# Patient Record
Sex: Female | Born: 1955 | Race: Black or African American | Hispanic: No | Marital: Married | Smoking: Never smoker
Health system: Southern US, Community
[De-identification: ages and names within clinical notes are randomized; demographics above are authoritative.]

## PROBLEM LIST (undated history)

## (undated) DIAGNOSIS — G43909 Migraine, unspecified, not intractable, without status migrainosus: Secondary | ICD-10-CM

## (undated) DIAGNOSIS — K5792 Diverticulitis of intestine, part unspecified, without perforation or abscess without bleeding: Secondary | ICD-10-CM

## (undated) DIAGNOSIS — M7989 Other specified soft tissue disorders: Secondary | ICD-10-CM

## (undated) DIAGNOSIS — R946 Abnormal results of thyroid function studies: Secondary | ICD-10-CM

## (undated) DIAGNOSIS — I10 Essential (primary) hypertension: Secondary | ICD-10-CM

## (undated) DIAGNOSIS — G4700 Insomnia, unspecified: Secondary | ICD-10-CM

## (undated) DIAGNOSIS — E559 Vitamin D deficiency, unspecified: Secondary | ICD-10-CM

## (undated) DIAGNOSIS — T451X5A Adverse effect of antineoplastic and immunosuppressive drugs, initial encounter: Secondary | ICD-10-CM

## (undated) DIAGNOSIS — D649 Anemia, unspecified: Secondary | ICD-10-CM

## (undated) DIAGNOSIS — D61818 Other pancytopenia: Secondary | ICD-10-CM

## (undated) DIAGNOSIS — I82409 Acute embolism and thrombosis of unspecified deep veins of unspecified lower extremity: Secondary | ICD-10-CM

## (undated) DIAGNOSIS — R569 Unspecified convulsions: Secondary | ICD-10-CM

## (undated) DIAGNOSIS — G629 Polyneuropathy, unspecified: Secondary | ICD-10-CM

## (undated) DIAGNOSIS — E079 Disorder of thyroid, unspecified: Secondary | ICD-10-CM

## (undated) DIAGNOSIS — M898X9 Other specified disorders of bone, unspecified site: Secondary | ICD-10-CM

## (undated) DIAGNOSIS — C9000 Multiple myeloma not having achieved remission: Secondary | ICD-10-CM

## (undated) DIAGNOSIS — Z9221 Personal history of antineoplastic chemotherapy: Secondary | ICD-10-CM

## (undated) DIAGNOSIS — E119 Type 2 diabetes mellitus without complications: Secondary | ICD-10-CM

## (undated) DIAGNOSIS — D701 Agranulocytosis secondary to cancer chemotherapy: Secondary | ICD-10-CM

## (undated) DIAGNOSIS — D472 Monoclonal gammopathy: Secondary | ICD-10-CM

## (undated) DIAGNOSIS — J40 Bronchitis, not specified as acute or chronic: Secondary | ICD-10-CM

## (undated) DIAGNOSIS — R413 Other amnesia: Secondary | ICD-10-CM

## (undated) HISTORY — DX: Agranulocytosis secondary to cancer chemotherapy: D70.1

## (undated) HISTORY — DX: Essential (primary) hypertension: I10

## (undated) HISTORY — DX: Anemia, unspecified: D64.9

## (undated) HISTORY — DX: Monoclonal gammopathy: D47.2

## (undated) HISTORY — DX: Bronchitis, not specified as acute or chronic: J40

## (undated) HISTORY — DX: Multiple myeloma not having achieved remission: C90.00

## (undated) HISTORY — DX: Polyneuropathy, unspecified: G62.9

## (undated) HISTORY — DX: Other pancytopenia: D61.818

## (undated) HISTORY — DX: Other amnesia: R41.3

## (undated) HISTORY — DX: Vitamin D deficiency, unspecified: E55.9

## (undated) HISTORY — PX: HEMORRHOID SURGERY: SHX153

## (undated) HISTORY — DX: Other specified soft tissue disorders: M79.89

## (undated) HISTORY — DX: Acute embolism and thrombosis of unspecified deep veins of unspecified lower extremity: I82.409

## (undated) HISTORY — DX: Abnormal results of thyroid function studies: R94.6

## (undated) HISTORY — DX: Adverse effect of antineoplastic and immunosuppressive drugs, initial encounter: T45.1X5A

## (undated) HISTORY — DX: Disorder of thyroid, unspecified: E07.9

## (undated) HISTORY — DX: Insomnia, unspecified: G47.00

## (undated) HISTORY — DX: Diverticulitis of intestine, part unspecified, without perforation or abscess without bleeding: K57.92

## (undated) HISTORY — DX: Other specified disorders of bone, unspecified site: M89.8X9

## (undated) HISTORY — DX: Migraine, unspecified, not intractable, without status migrainosus: G43.909

## (undated) HISTORY — DX: Unspecified convulsions: R56.9

---
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
## 1999-10-09 ENCOUNTER — Encounter: Admission: RE | Admit: 1999-10-09 | Discharge: 1999-10-09 | Payer: Self-pay | Admitting: Gynecology

## 1999-10-09 ENCOUNTER — Encounter: Payer: Self-pay | Admitting: Gynecology

## 2001-02-23 ENCOUNTER — Other Ambulatory Visit: Admission: RE | Admit: 2001-02-23 | Discharge: 2001-02-23 | Payer: Self-pay | Admitting: Gastroenterology

## 2001-05-11 ENCOUNTER — Other Ambulatory Visit: Admission: RE | Admit: 2001-05-11 | Discharge: 2001-05-11 | Payer: Self-pay | Admitting: Gynecology

## 2001-07-20 ENCOUNTER — Other Ambulatory Visit: Admission: RE | Admit: 2001-07-20 | Discharge: 2001-07-20 | Payer: Self-pay | Admitting: Gynecology

## 2001-09-13 ENCOUNTER — Encounter: Admission: RE | Admit: 2001-09-13 | Discharge: 2001-09-13 | Payer: Self-pay | Admitting: Gynecology

## 2001-09-13 ENCOUNTER — Encounter: Payer: Self-pay | Admitting: Gynecology

## 2001-12-27 ENCOUNTER — Other Ambulatory Visit: Admission: RE | Admit: 2001-12-27 | Discharge: 2001-12-27 | Payer: Self-pay | Admitting: Gynecology

## 2002-08-12 ENCOUNTER — Other Ambulatory Visit: Admission: RE | Admit: 2002-08-12 | Discharge: 2002-08-12 | Payer: Self-pay | Admitting: Gynecology

## 2002-11-16 ENCOUNTER — Encounter: Payer: Self-pay | Admitting: Gynecology

## 2002-11-16 ENCOUNTER — Encounter: Admission: RE | Admit: 2002-11-16 | Discharge: 2002-11-16 | Payer: Self-pay | Admitting: Gynecology

## 2003-01-26 ENCOUNTER — Other Ambulatory Visit: Admission: RE | Admit: 2003-01-26 | Discharge: 2003-01-26 | Payer: Self-pay | Admitting: Gynecology

## 2004-02-08 ENCOUNTER — Encounter: Admission: RE | Admit: 2004-02-08 | Discharge: 2004-02-08 | Payer: Self-pay | Admitting: Gynecology

## 2004-07-25 ENCOUNTER — Other Ambulatory Visit: Admission: RE | Admit: 2004-07-25 | Discharge: 2004-07-25 | Payer: Self-pay | Admitting: Gynecology

## 2004-07-26 ENCOUNTER — Encounter: Admission: RE | Admit: 2004-07-26 | Discharge: 2004-07-26 | Payer: Self-pay | Admitting: Gynecology

## 2005-05-23 ENCOUNTER — Encounter: Admission: RE | Admit: 2005-05-23 | Discharge: 2005-05-23 | Payer: Self-pay | Admitting: Internal Medicine

## 2005-08-14 ENCOUNTER — Encounter: Admission: RE | Admit: 2005-08-14 | Discharge: 2005-08-14 | Payer: Self-pay | Admitting: *Deleted

## 2006-02-06 ENCOUNTER — Other Ambulatory Visit: Admission: RE | Admit: 2006-02-06 | Discharge: 2006-02-06 | Payer: Self-pay | Admitting: Gynecology

## 2006-07-02 ENCOUNTER — Encounter: Admission: RE | Admit: 2006-07-02 | Discharge: 2006-07-02 | Payer: Self-pay | Admitting: Gynecology

## 2007-07-06 ENCOUNTER — Encounter: Admission: RE | Admit: 2007-07-06 | Discharge: 2007-07-06 | Payer: Self-pay | Admitting: Gynecology

## 2007-07-21 ENCOUNTER — Other Ambulatory Visit: Admission: RE | Admit: 2007-07-21 | Discharge: 2007-07-21 | Payer: Self-pay | Admitting: Gynecology

## 2007-07-21 ENCOUNTER — Encounter: Admission: RE | Admit: 2007-07-21 | Discharge: 2007-07-21 | Payer: Self-pay | Admitting: Internal Medicine

## 2008-07-06 ENCOUNTER — Encounter: Admission: RE | Admit: 2008-07-06 | Discharge: 2008-07-06 | Payer: Self-pay | Admitting: Internal Medicine

## 2008-07-24 ENCOUNTER — Encounter: Admission: RE | Admit: 2008-07-24 | Discharge: 2008-07-24 | Payer: Self-pay | Admitting: Internal Medicine

## 2009-01-29 ENCOUNTER — Encounter: Admission: RE | Admit: 2009-01-29 | Discharge: 2009-01-29 | Payer: Self-pay | Admitting: Internal Medicine

## 2009-07-01 ENCOUNTER — Emergency Department (HOSPITAL_COMMUNITY): Admission: EM | Admit: 2009-07-01 | Discharge: 2009-07-01 | Payer: Self-pay | Admitting: Emergency Medicine

## 2009-07-17 ENCOUNTER — Encounter: Admission: RE | Admit: 2009-07-17 | Discharge: 2009-07-17 | Payer: Self-pay | Admitting: Gynecology

## 2009-07-25 ENCOUNTER — Ambulatory Visit: Payer: Self-pay | Admitting: Psychology

## 2009-10-08 ENCOUNTER — Ambulatory Visit: Payer: Self-pay | Admitting: Psychology

## 2010-07-19 ENCOUNTER — Encounter: Admission: RE | Admit: 2010-07-19 | Discharge: 2010-07-19 | Payer: Self-pay | Admitting: Gynecology

## 2010-08-14 ENCOUNTER — Encounter: Admission: RE | Admit: 2010-08-14 | Discharge: 2010-08-14 | Payer: Self-pay | Admitting: Internal Medicine

## 2010-12-22 ENCOUNTER — Encounter: Payer: Self-pay | Admitting: Otolaryngology

## 2011-07-21 ENCOUNTER — Other Ambulatory Visit: Payer: Self-pay | Admitting: Gynecology

## 2011-07-21 DIAGNOSIS — Z1231 Encounter for screening mammogram for malignant neoplasm of breast: Secondary | ICD-10-CM

## 2011-07-28 ENCOUNTER — Ambulatory Visit
Admission: RE | Admit: 2011-07-28 | Discharge: 2011-07-28 | Disposition: A | Discharge status: Home / Self Care | Payer: 59 | Source: Ambulatory Visit | Attending: Gynecology | Admitting: Gynecology

## 2011-07-28 DIAGNOSIS — Z1231 Encounter for screening mammogram for malignant neoplasm of breast: Secondary | ICD-10-CM

## 2012-01-28 ENCOUNTER — Other Ambulatory Visit: Payer: Self-pay | Admitting: Internal Medicine

## 2012-01-28 DIAGNOSIS — K219 Gastro-esophageal reflux disease without esophagitis: Secondary | ICD-10-CM

## 2012-01-29 ENCOUNTER — Ambulatory Visit
Admission: RE | Admit: 2012-01-29 | Discharge: 2012-01-29 | Disposition: A | Discharge status: Home / Self Care | Payer: 59 | Source: Ambulatory Visit | Attending: Internal Medicine | Admitting: Internal Medicine

## 2012-01-29 DIAGNOSIS — K219 Gastro-esophageal reflux disease without esophagitis: Secondary | ICD-10-CM

## 2012-09-08 ENCOUNTER — Other Ambulatory Visit: Payer: Self-pay | Admitting: Gynecology

## 2012-09-08 DIAGNOSIS — Z1231 Encounter for screening mammogram for malignant neoplasm of breast: Secondary | ICD-10-CM

## 2012-10-13 ENCOUNTER — Ambulatory Visit
Admission: RE | Admit: 2012-10-13 | Discharge: 2012-10-13 | Disposition: A | Discharge status: Home / Self Care | Payer: 59 | Source: Ambulatory Visit | Attending: Gynecology | Admitting: Gynecology

## 2012-10-13 DIAGNOSIS — Z1231 Encounter for screening mammogram for malignant neoplasm of breast: Secondary | ICD-10-CM

## 2012-11-03 DIAGNOSIS — R413 Other amnesia: Secondary | ICD-10-CM

## 2013-05-26 ENCOUNTER — Other Ambulatory Visit: Payer: Self-pay

## 2013-05-26 MED ORDER — VERAPAMIL HCL ER 120 MG PO TBCR: 120.0000 mg | EXTENDED_RELEASE_TABLET | Freq: Every day | ORAL | Status: DC

## 2013-05-26 NOTE — Telephone Encounter (Signed)
Former [facility] patient.  Has not been assigned new provider.  Auth refill via [facility]

## 2013-05-30 ENCOUNTER — Telehealth: Payer: Self-pay | Admitting: Neurology

## 2013-05-30 NOTE — Telephone Encounter (Signed)
I returned patient's call and left a message with her husband that she has been assigned Dr. Hosie Poisson. She will receive a call in the next couple weeks with an appointment date with Dr. Hosie Poisson.

## 2013-06-27 ENCOUNTER — Encounter: Payer: Self-pay | Admitting: Neurology

## 2013-06-28 ENCOUNTER — Ambulatory Visit (INDEPENDENT_AMBULATORY_CARE_PROVIDER_SITE_OTHER): Payer: 59 | Admitting: Neurology

## 2013-06-28 ENCOUNTER — Encounter: Payer: Self-pay | Admitting: Neurology

## 2013-06-28 VITALS — BP 127/63 | HR 67 | Ht 67.0 in | Wt 180.0 lb

## 2013-06-28 DIAGNOSIS — G43719 Chronic migraine without aura, intractable, without status migrainosus: Secondary | ICD-10-CM

## 2013-06-28 MED ORDER — VERAPAMIL HCL ER 120 MG PO TBCR: 120.0000 mg | EXTENDED_RELEASE_TABLET | Freq: Every day | ORAL | Status: DC

## 2013-06-28 MED ORDER — VENLAFAXINE HCL ER 75 MG PO CP24: 75.0000 mg | ORAL_CAPSULE | Freq: Two times a day (BID) | ORAL | Status: DC

## 2013-06-28 NOTE — Progress Notes (Signed)
Provider:  Dr Hosie Poisson Referring Provider: No ref. provider found Primary Care Physician:  Georgann Housekeeper, MD  Chief Complaint  Patient presents with  . Memory Loss    # 16 Revisit- [facility]    HPI:  Carla Perry is a 57 y.o. female here as a follow up appointment for chronic migraines with last visit being on 08/09/2012 with Dr Sandria Manly.   She reports being overall stable. Continues to have daily migrainous headache which have been ongoing for greater then 4 years. Predominantly R sided temporal, described as squeezing and pounding. These headaches are her baseline headache and are occuring on a near continuous pattern every day of the month. Is also having severe "flare ups" where the pain worsens and becomes a more sharp/intense pain. These episodes are occuring around 8 to 10 times per month. With these headaches she continues to have episodes of transient unilateral (predominantly R sided) weakness that self resolves. Also notes occasional unilateral sensory changes (again R sided predominant). Also has episodes of transient vertigo associated with the headache, has occurred 2 times in the past month, requiring her to miss work. She has had multiple MRIs during these events to rule out stroke and no ischemic changes have been found. Has been unable to tolerate most abortive agents and due to presence of hemiplegic/basilar symptoms she is not a candidate for triptans or DHE. In regards to prophylactic agents she has tried for >3-4 months the following agents and failed/did not tolerate: Topamax (AED), Propranolol (BB), verapamil (CCB) and Effexor (SNRI). Remains on verapmil and Effexor per Dr Sandria Manly but no benefit noted. Denies any family history of hemiplegic migraine.   Continues to have subjective memory difficulties, notes a "fogginess". Has had formal neuro-psych testing which showed an improvement in her overall cognitive function. Continues to have episodes of auditory hallucinations. Has not seen a  psychiatrist for this.    Last note per Dr Sandria Manly from 08/09/2012: 56y/o AAW initially evaluated for LUE weakness, associated with headache. Initially having headaches 4 to 5 times per week. Had MRI/A and homocystine workup in 2006 which was normal. Started on ASA for possible TIA. Started on Effexor for headaches in 2009 with no benefit. Had normal EEG. Started on topamax 75mg  qhs for headaches which initially improved headache frequency. Tried on propranolol which initially gave good benefit of headache frequency but was not tolerated. Was tapered off Topamax with no benefit and adverse effects and tried on Verapamil. At last visit, the headaches were occuring daily, on teh R side, described as sharp constant and can last hours. Missing work. Also notes auditory hallucinations, hears a voice telling her to "shut up". Is present constantly. Recently had formal neuro-psych cognitive testing done which showed unremarkable findings. Had Brain MRI done on 08/2012 which was unremarkable.  Review of Systems: Out of a complete 14 system review, the patient complains of only the following symptoms, and all other reviewed systems are negative. + for anemia, feeling hot, memory loss, HA, numbness, dizziness, depression, disinterest History   Social History  . Marital Status: Single    Spouse Name: N/A    Number of Children: N/A  . Years of Education: N/A   Occupational History  . Not on file.   Social History Main Topics  . Smoking status: Never Smoker   . Smokeless tobacco: Never Used  . Alcohol Use: No  . Drug Use: No  . Sexually Active: Not on file   Other Topics Concern  . Not on file  Social History Narrative  . No narrative on file    Family History  Problem Relation Age of Onset  . Throat cancer Father   . Stroke Father     Past Medical History  Diagnosis Date  . HBP (high blood pressure)   . Thyroid disorder   . Migraine   . Seizure   . Memory loss     Past Surgical  History  Procedure Laterality Date  . Hemorrhoid surgery      Current Outpatient Prescriptions  Medication Sig Dispense Refill  . Cholecalciferol (VITAMIN D3) 5000 UNITS CAPS Take by mouth. Every other week      . ferrous sulfate 325 (65 FE) MG tablet Take 325 mg by mouth every other day.      . hydrochlorothiazide (HYDRODIURIL) 25 MG tablet Take 25 mg by mouth daily.      Marland Kitchen levothyroxine (SYNTHROID, LEVOTHROID) 75 MCG tablet Take 75 mcg by mouth daily before breakfast.      . LEVOTHYROXINE SODIUM PO Take by mouth.      . Multiple Vitamins-Minerals (CENTRUM SILVER PO) Take by mouth daily.      . polyethylene glycol (MIRALAX / GLYCOLAX) packet Take 17 g by mouth daily.      Marland Kitchen venlafaxine XR (EFFEXOR XR) 75 MG 24 hr capsule Take 75 mg by mouth 2 (two) times daily.      . verapamil (CALAN-SR) 120 MG CR tablet Take 1 tablet (120 mg total) by mouth daily.  90 tablet  0   No current facility-administered medications for this visit.    Allergies as of 06/28/2013  . (Not on File)    Vitals: There were no vitals taken for this visit. Last Weight:  Wt Readings from Last 1 Encounters:  No data found for Wt   Last Height:   Ht Readings from Last 1 Encounters:  No data found for Ht     Physical exam: Exam: Gen: NAD, conversant Eyes: anicteric sclerae, moist conjunctivae HENT: Atraumati Lungs: CTA, no wheezing, rales, rhonic                          CV: RRR, no MRG Abdomen: Soft, non-tender;  Extremities: No peripheral edema  Skin: Normal temperature, no rash,  Psych: Appropriate affect, pleasant  Neuro: MS: AA&Ox3, appropriately interactive, normal affect   MMSE 29/30  CN: PERRL, EOMI no nystagmus, VFF to FC, no temporal tenderness, no ptosis, LT splits midline at V3, vibration has R sided preference, face symmetric, no weakness, hearing grossly intact, palate elevates symmetrically, shoulder shrug 5/5 bilat,  tongue protrudes midline, no fasiculations noted.  Motor:  normal bulk and tone, no drift noted, notes some tenderness in bilat trapezius region Strength: 5/5  In all extremities  Coord: rapid alternating and point-to-point (FNF, HTS) movements intact.  Reflexes: symmetrical, bilat downgoing toes  Sens: LT intact in all extremities  Gait: posture, stance, stride and arm-swing normal. .   Assessment:  After physical and neurologic examination, review of laboratory studies, imaging, neurophysiology testing and pre-existing records, assessment will be reviewed on the problem list.  Plan:  Treatment plan and additional workup will be reviewed under Problem List.  Ms Hofland is a pleasant 57y/ woman with a hx of chronic migraine presenting for routine follow up. Reports continued migraine headaches refractory to oral medications. She has currently tried 4 oral prophylactic agents/classes and has been unable to tolerate or receive benefit from these medications. Clinically she appears  stable with no changes noted on physical exam.   Chronic migraine without aura, with intractable migraine, so stated, without mention of status migrainosus -will place referral for botulinum toxin therapy for medically refractory migraine headaches -patient and husband were counseled extensively on the use of Botox for migraines, information was given -no medication changes made at this time, patient wishes to remain on current regimen pending injections -suggested psychiatry follow up to patient for auditory hallucinations -will follow up for Botox injections and then plan follow up with Darrol Angel, NP  A total of 30 minutes was spent in with this patient. Over half this time was spent on counseling patient on the diagnosis and different therapeutic options available. We discussed the use of botulinum toxin for treatment of migraine headaches, the potential adverse effects, risks and benefits were all discussed in detail. All questions were fully answered.

## 2013-06-28 NOTE — Patient Instructions (Signed)
Overall you are doing fairly well but I do want to suggest a few things today:   Remember to drink plenty of fluid, eat healthy meals and do not skip any meals. Try to eat protein with a every meal and eat a healthy snack such as fruit or nuts in between meals. Try to keep a regular sleep-wake schedule and try to exercise daily, particularly in the form of walking, 20-30 minutes a day, if you can.   As far as your medications are concerned, we will not make any changes to your current regimen. We discussed the use of Botox injections as a good option to treat your chronic migraines. I provided you with some information. Please read over this and let me know if you have any further questions or would like to pursue this.  I would like to see you back in 4 to 6 months, sooner if we need to. Please call us with any interim questions, concerns, problems, updates or refill requests.   Please also call us for any test results so we can go over those with you on the phone.  My clinical assistant and will answer any of your questions and relay your messages to me and also relay most of my messages to you.   Our phone number is [phone]. We also have an after hours call service for urgent matters and there is a physician on-call for urgent questions. For any emergencies you know to call 911 or go to the nearest emergency room

## 2013-07-01 ENCOUNTER — Other Ambulatory Visit: Payer: Self-pay | Admitting: Neurology

## 2013-07-01 DIAGNOSIS — G43909 Migraine, unspecified, not intractable, without status migrainosus: Secondary | ICD-10-CM

## 2013-07-01 MED ORDER — ONABOTULINUMTOXINA 200 UNITS IJ SOLR: 200.0000 [IU] | INTRAMUSCULAR | Status: DC

## 2013-07-10 ENCOUNTER — Other Ambulatory Visit: Payer: Self-pay

## 2013-07-10 MED ORDER — VERAPAMIL HCL ER 120 MG PO CP24: 120.0000 mg | ORAL_CAPSULE | Freq: Every day | ORAL | Status: DC

## 2013-07-10 NOTE — Telephone Encounter (Signed)
Patient prefers capsules rather than tablets. She was previously on capsules from Dr Sandria Manly

## 2013-07-18 ENCOUNTER — Other Ambulatory Visit: Payer: Self-pay | Admitting: Neurology

## 2013-07-18 MED ORDER — ONABOTULINUMTOXINA 200 UNITS IJ SOLR: INTRAMUSCULAR | Status: DC

## 2013-08-19 ENCOUNTER — Encounter: Payer: Self-pay | Admitting: Neurology

## 2013-08-19 ENCOUNTER — Ambulatory Visit (INDEPENDENT_AMBULATORY_CARE_PROVIDER_SITE_OTHER): Payer: 59 | Admitting: Neurology

## 2013-08-19 VITALS — BP 111/72 | HR 67 | Ht 67.0 in | Wt 176.5 lb

## 2013-08-19 DIAGNOSIS — G43709 Chronic migraine without aura, not intractable, without status migrainosus: Secondary | ICD-10-CM

## 2013-08-19 DIAGNOSIS — IMO0002 Reserved for concepts with insufficient information to code with codable children: Secondary | ICD-10-CM

## 2013-08-19 DIAGNOSIS — G43719 Chronic migraine without aura, intractable, without status migrainosus: Secondary | ICD-10-CM

## 2013-08-19 NOTE — Progress Notes (Signed)
Provider:  Dr Hosie Poisson Referring Provider: Georgann Housekeeper, MD Primary Care Physician:  Georgann Housekeeper, MD  CC:  Migraine headaches  HPI:  Carla Perry is a 57 y.o. female here for initial botox injections for migraine headache. Since last visit she continues to have chronic headaches refractory to medication.    Contraindications and precautions discussed with patient. Aseptic procedure was observed and patient tolerated procedure. Procedure performed by Dr. Elspeth Cho.  The condition has existed for more than 6 months, and pt does not have a diagnosis of ALS, Myasthenia Gravis or Lambert-Eaton Syndrome.  Risks and benefits of injections discussed and pt agrees to proceed with the procedure.  Written consent obtained These injections are medically necessary. He receives good benefits from these injections. These injections do not cause sedations or hallucinations which the oral therapies may cause.  Indication/Diagnosis:chronic migraine  Type of toxin:Botox  Lot # [id number]  Expiration date: March 2016   Muscle Site    L   R  Corrugator    5   5  Procerus      5   Frontalis    5   5  Temporalis    5   5  Occipitalis    5   5  Trapezius    5   5  Cervical Paraspinals  5   5  Patient tolerated procedure well, 155 units used, 45 units discarded    History   Social History  . Marital Status: Single    Spouse Name: Merton Border    Number of Children: 2  . 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 Smoking status: Never Smoker   . Smokeless tobacco: Never Used  . Alcohol Use: No  . Drug Use: No  . Sexual Activity: Not on file   Other Topics Concern  . Not on file   Social History Narrative   Patient lives at home with her husband Systems developer). Patient has two years college.   Right handed.   Caffeine- None    Family History  Problem Relation Age of Onset  . Throat cancer Father   . Stroke Father     Past  Medical History  Diagnosis Date  . HBP (high blood pressure)   . Thyroid disorder   . Migraine   . Seizure   . Memory loss     Past Surgical History  Procedure Laterality Date  . Hemorrhoid surgery      Current Outpatient Prescriptions  Medication Sig Dispense Refill  . Botulinum Toxin Type A 200 UNITS SOLR Dispense 200 units. To be injected by physician during office visit  1 each  0  . Cholecalciferol (VITAMIN D3) 5000 UNITS CAPS Take by mouth. Every other week      . ferrous sulfate 325 (65 FE) MG tablet Take 325 mg by mouth every other day.      . hydrochlorothiazide (HYDRODIURIL) 25 MG tablet Take 25 mg by mouth daily.      Marland Kitchen levothyroxine (SYNTHROID, LEVOTHROID) 75 MCG tablet Take 75 mcg by mouth daily before breakfast.      . LEVOTHYROXINE SODIUM PO Take by mouth.      . Multiple Vitamins-Minerals (CENTRUM SILVER PO) Take by mouth daily.      . polyethylene glycol (MIRALAX / GLYCOLAX) packet Take 17 g by mouth daily.      Marland Kitchen venlafaxine XR (EFFEXOR XR) 75 MG 24 hr capsule  Take 1 capsule (75 mg total) by mouth 2 (two) times daily.  180 capsule  3  . verapamil (VERELAN PM) 120 MG 24 hr capsule Take 1 capsule (120 mg total) by mouth daily.  90 capsule  3   No current facility-administered medications for this visit.    Allergies as of 08/19/2013  . (No Known Allergies)    Vitals: There were no vitals taken for this visit. Last Weight:  Wt Readings from Last 1 Encounters:  06/28/13 180 lb (81.647 kg)   Last Height:   Ht Readings from Last 1 Encounters:  06/28/13 5\' 7"  (1.702 m)     Assessment:  After physical and neurologic examination, review of laboratory studies, imaging, neurophysiology testing and pre-existing records, assessment will be reviewed on the problem list.  Plan:  Treatment plan and additional workup will be reviewed under Problem List.  Carla Perry is a pleasant 57 y/o woman presenting for initial Botox injections for chronic migraine headache. She  tolerated the procedure well with no adverse effects. 1) Botox injections as detailed above. A total of 155 units was used, order 200 units for next visit. 2) Tylenol or Motrin for injection site pain. 3) Medication guide dispensed. 4) Follow up for repeat injections in 3 months

## 2013-08-19 NOTE — Patient Instructions (Addendum)
1) Botox injections as detailed above. A total of 155 units was used. 2) Tylenol or Motrin for injection site pain. 3) Medication guide dispensed. 4) Follow up for repeat injections in 3 months -benefits and risks discussed with patient and his wife. consent was obtained. possible side effects were discussed and full package insert was given to patient.

## 2013-08-29 ENCOUNTER — Encounter: Payer: Self-pay | Admitting: Neurology

## 2013-09-06 ENCOUNTER — Other Ambulatory Visit: Payer: Self-pay

## 2013-09-06 DIAGNOSIS — Z1231 Encounter for screening mammogram for malignant neoplasm of breast: Secondary | ICD-10-CM

## 2013-09-21 ENCOUNTER — Other Ambulatory Visit (HOSPITAL_COMMUNITY)
Admission: RE | Admit: 2013-09-21 | Discharge: 2013-09-21 | Disposition: A | Discharge status: Home / Self Care | Payer: 59 | Source: Ambulatory Visit | Attending: Obstetrics & Gynecology | Admitting: Obstetrics & Gynecology

## 2013-09-21 ENCOUNTER — Other Ambulatory Visit: Payer: Self-pay | Admitting: Obstetrics & Gynecology

## 2013-09-21 DIAGNOSIS — Z01419 Encounter for gynecological examination (general) (routine) without abnormal findings: Secondary | ICD-10-CM | POA: Insufficient documentation

## 2013-09-21 DIAGNOSIS — Z1151 Encounter for screening for human papillomavirus (HPV): Secondary | ICD-10-CM | POA: Insufficient documentation

## 2013-09-27 ENCOUNTER — Telehealth: Payer: Self-pay | Admitting: Hematology and Oncology

## 2013-09-27 NOTE — Telephone Encounter (Signed)
S/W PT IN REF TO NP APPT. ON 10/06/13@9 :30 REFERRING DR HUSAIN DX-ELEVATED PROTEIN,POSITIVE SPEP MAILED NP PACKET

## 2013-09-27 NOTE — Telephone Encounter (Signed)
C/D 09/27/13 for appt. 10/06/13

## 2013-10-06 ENCOUNTER — Ambulatory Visit (HOSPITAL_BASED_OUTPATIENT_CLINIC_OR_DEPARTMENT_OTHER): Payer: 59 | Admitting: Hematology and Oncology

## 2013-10-06 ENCOUNTER — Ambulatory Visit: Payer: 59

## 2013-10-06 ENCOUNTER — Telehealth: Payer: Self-pay | Admitting: Hematology and Oncology

## 2013-10-06 ENCOUNTER — Encounter: Payer: Self-pay | Admitting: Hematology and Oncology

## 2013-10-06 ENCOUNTER — Other Ambulatory Visit: Payer: 59 | Admitting: Lab

## 2013-10-06 ENCOUNTER — Other Ambulatory Visit: Payer: Self-pay

## 2013-10-06 VITALS — BP 135/84 | HR 80 | Temp 97.8°F | Resp 19 | Ht 67.0 in | Wt 179.7 lb

## 2013-10-06 DIAGNOSIS — Z87898 Personal history of other specified conditions: Secondary | ICD-10-CM

## 2013-10-06 DIAGNOSIS — D630 Anemia in neoplastic disease: Secondary | ICD-10-CM | POA: Insufficient documentation

## 2013-10-06 DIAGNOSIS — M899 Disorder of bone, unspecified: Secondary | ICD-10-CM

## 2013-10-06 DIAGNOSIS — D472 Monoclonal gammopathy: Secondary | ICD-10-CM

## 2013-10-06 DIAGNOSIS — D509 Iron deficiency anemia, unspecified: Secondary | ICD-10-CM

## 2013-10-06 DIAGNOSIS — D649 Anemia, unspecified: Secondary | ICD-10-CM

## 2013-10-06 HISTORY — DX: Anemia, unspecified: D64.9

## 2013-10-06 HISTORY — DX: Monoclonal gammopathy: D47.2

## 2013-10-06 NOTE — Progress Notes (Signed)
[facility] CONSULT NOTE  Patient Care Team: Georgann Housekeeper, MD as PCP - General (Internal Medicine)  CHIEF COMPLAINTS/PURPOSE OF CONSULTATION:  Elevated M spike  HISTORY OF PRESENTING ILLNESS:  Carla Perry Reasons 57 y.o. female is here because of abnormal blood work. The patient has chronic muscle and bone pain for a long time. The pain is diffuse, all over her body. She was also found to have chronic anemia. The patient denies any recent signs or symptoms of bleeding such as spontaneous epistaxis, hematuria or hematochezia. She denies any recent fever, chills, night sweats or abnormal weight loss Denies history of recurrent infection.  MEDICAL HISTORY:  Past Medical History  Diagnosis Date  . HBP (high blood pressure)   . Thyroid disorder   . Migraine   . Seizure   . Memory loss   . Anemia   . MGUS (monoclonal gammopathy of unknown significance)   . MGUS (monoclonal gammopathy of unknown significance) 10/06/2013  . Anemia, unspecified 10/06/2013    SURGICAL HISTORY: Past Surgical History  Procedure Laterality Date  . Hemorrhoid surgery      SOCIAL HISTORY: History   Social History  . Marital Status: Single    Spouse Name: Merton Border    Number of Children: 2  . 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 Smoking status: Never Smoker   . Smokeless tobacco: Never Used  . Alcohol Use: No  . Drug Use: No  . Sexual Activity: Not on file   Other Topics Concern  . Not on file   Social History Narrative   Patient lives at home with her husband Systems developer). Patient has two years college.   Right handed.   Caffeine- None    FAMILY HISTORY: Family History  Problem Relation Age of Onset  . Throat cancer Father   . Stroke Father   . Cancer Brother     prostate    ALLERGIES:  has No Known Allergies.  MEDICATIONS:  Current Outpatient Prescriptions  Medication Sig Dispense Refill  . Botulinum Toxin Type A  200 UNITS SOLR Dispense 200 units. To be injected by physician during office visit  1 each  0  . Cholecalciferol (VITAMIN D3) 5000 UNITS CAPS Take by mouth. Every other week      . ferrous sulfate 325 (65 FE) MG tablet Take 325 mg by mouth every other day.      . hydrochlorothiazide (HYDRODIURIL) 25 MG tablet Take 25 mg by mouth daily.      Marland Kitchen levothyroxine (SYNTHROID, LEVOTHROID) 75 MCG tablet Take 75 mcg by mouth daily before breakfast.      . Multiple Vitamins-Minerals (CENTRUM SILVER PO) Take by mouth daily.      . polyethylene glycol (MIRALAX / GLYCOLAX) packet Take 17 g by mouth daily.      Marland Kitchen venlafaxine XR (EFFEXOR XR) 75 MG 24 hr capsule Take 1 capsule (75 mg total) by mouth 2 (two) times daily.  180 capsule  3  . verapamil (VERELAN PM) 120 MG 24 hr capsule Take 1 capsule (120 mg total) by mouth daily.  90 capsule  3   No current facility-administered medications for this visit.    REVIEW OF SYSTEMS:   Constitutional: Denies fevers, chills or abnormal night sweats Eyes: Denies blurriness of vision, double vision or watery eyes Ears, nose, mouth, throat, and face: Denies mucositis or sore throat Respiratory: Denies cough, dyspnea or wheezes Cardiovascular: Denies  palpitation, chest discomfort or lower extremity swelling Gastrointestinal:  Denies nausea, heartburn or change in bowel habits Skin: Denies abnormal skin rashes Lymphatics: Denies new lymphadenopathy or easy bruising Neurological:Denies numbness, tingling or new weaknesses Behavioral/Psych: Mood is stable, no new changes  All other systems were reviewed with the patient and are negative.  PHYSICAL EXAMINATION: ECOG PERFORMANCE STATUS: 1 - Symptomatic but completely ambulatory  Filed Vitals:   10/06/13 1014  BP: 135/84  Pulse: 80  Temp: 97.8 F (36.6 C)  Resp: 19   Filed Weights   10/06/13 1014  Weight: 179 lb 11.2 oz (81.511 kg)    GENERAL:alert, no distress and comfortable SKIN: skin color, texture, turgor  are normal, no rashes or significant lesions EYES: normal, conjunctiva are pink and non-injected, sclera clear OROPHARYNX:no exudate, no erythema and lips, buccal mucosa, and tongue normal  NECK: supple, thyroid normal size, non-tender, without nodularity LYMPH:  no palpable lymphadenopathy in the cervical, axillary or inguinal LUNGS: clear to auscultation and percussion with normal breathing effort HEART: regular rate & rhythm and no murmurs and no lower extremity edema ABDOMEN:abdomen soft, non-tender and normal bowel sounds Musculoskeletal:no cyanosis of digits and no clubbing  PSYCH: alert & oriented x 3 with fluent speech NEURO: no focal motor/sensory deficits  LABORATORY DATA:  I have reviewed the data as listed Bloodwork from her physician office dated 09/21/2013 revealed 2.7 g of M spike. CBC showed hemoglobin of 10 with MCV of 73, creatinine of 0.8, albumin of 4.1, calcium of 9.6  ASSESSMENT:  #1 microcytic anemia #2 elevated M spike #3 severe bone pain  PLAN:  Discuss with the patient after history of multiple myeloma. The patient had nearly 3 g of M spike along with chronic anemia. Fortunately there is no evidence of kidney dysfunction or high calcium. I recommend a full workup with blood work, urine test, an x-ray to rule out multiple myeloma. If the test confirm the suspicion, she may need a bone marrow aspirate and biopsy. For her microcytic anemia, and checking iron studies did confirm whether she indeed has iron deficiency anemia. Orders Placed This Encounter  Procedures  . DG Bone Survey Met    Standing Status: Future     Number of Occurrences:      Standing Expiration Date: 12/06/2014    Order Specific Question:  Reason for Exam (SYMPTOM  OR DIAGNOSIS REQUIRED)    Answer:  staging myeloma    Order Specific Question:  Is the patient pregnant?    Answer:  No    Order Specific Question:  Preferred imaging location?    Answer:  [facility]  . Beta 2  microglobuline, serum    Standing Status: Future     Number of Occurrences:      Standing Expiration Date: 10/06/2014  . SPEP & IFE with QIG    Standing Status: Future     Number of Occurrences:      Standing Expiration Date: 10/06/2014  . Protein Electro, 24-Hour Urine    Standing Status: Future     Number of Occurrences:      Standing Expiration Date: 10/06/2014  . Kappa/lambda light chains    Standing Status: Future     Number of Occurrences:      Standing Expiration Date: 10/06/2014  . Immunofixation interpretive, urine    Standing Status: Future     Number of Occurrences:      Standing Expiration Date: 10/06/2014  . IFE, Urine (with Tot Prot)    Standing  Status: Future     Number of Occurrences:      Standing Expiration Date: 10/06/2014  . Comprehensive metabolic panel    Standing Status: Future     Number of Occurrences:      Standing Expiration Date: 10/06/2014  . CBC & Diff and Retic    Standing Status: Future     Number of Occurrences:      Standing Expiration Date: 10/06/2014  . Ferritin    Standing Status: Future     Number of Occurrences:      Standing Expiration Date: 10/06/2014  . Sedimentation rate    Standing Status: Future     Number of Occurrences:      Standing Expiration Date: 10/06/2014    All questions were answered. The patient knows to call the clinic with any problems, questions or concerns.    [facility], Dempsy Damiano, MD 10/06/2013 12:34 PM

## 2013-10-06 NOTE — Telephone Encounter (Signed)
gave pt appt for lab and Md in november , sent pt to labs for urine jug, reminded pt of x-ray before md visit

## 2013-10-06 NOTE — Progress Notes (Signed)
Checked in new patient with no financial issues. She has not been to [place] and she has her appt card. °

## 2013-10-10 ENCOUNTER — Other Ambulatory Visit (HOSPITAL_BASED_OUTPATIENT_CLINIC_OR_DEPARTMENT_OTHER): Payer: 59 | Admitting: Lab

## 2013-10-10 DIAGNOSIS — D472 Monoclonal gammopathy: Secondary | ICD-10-CM

## 2013-10-10 DIAGNOSIS — D649 Anemia, unspecified: Secondary | ICD-10-CM

## 2013-10-10 LAB — COMPREHENSIVE METABOLIC PANEL (CC13)
ALT: 23 U/L (ref 0–55)
AST: 20 U/L (ref 5–34)
Albumin: 3.6 g/dL (ref 3.5–5.0)
Alkaline Phosphatase: 75 U/L (ref 40–150)
Anion Gap: 9 mEq/L (ref 3–11)
BUN: 15.6 mg/dL (ref 7.0–26.0)
CO2: 24 mEq/L (ref 22–29)
Calcium: 9.9 mg/dL (ref 8.4–10.4)
Chloride: 104 mEq/L (ref 98–109)
Creatinine: 0.8 mg/dL (ref 0.6–1.1)
Glucose: 121 mg/dl (ref 70–140)
Potassium: 3.5 mEq/L (ref 3.5–5.1)
Sodium: 138 mEq/L (ref 136–145)
Total Bilirubin: 0.34 mg/dL (ref 0.20–1.20)
Total Protein: 9.4 g/dL — ABNORMAL HIGH (ref 6.4–8.3)

## 2013-10-10 LAB — CBC & DIFF AND RETIC
BASO%: 0.3 % (ref 0.0–2.0)
Basophils Absolute: 0 10*3/uL (ref 0.0–0.1)
EOS%: 0 % (ref 0.0–7.0)
Eosinophils Absolute: 0 10*3/uL (ref 0.0–0.5)
HCT: 32.8 % — ABNORMAL LOW (ref 34.8–46.6)
HGB: 10.5 g/dL — ABNORMAL LOW (ref 11.6–15.9)
Immature Retic Fract: 10.6 % — ABNORMAL HIGH (ref 1.60–10.00)
LYMPH%: 48.5 % (ref 14.0–49.7)
MCH: 23.2 pg — ABNORMAL LOW (ref 25.1–34.0)
MCHC: 32 g/dL (ref 31.5–36.0)
MCV: 72.6 fL — ABNORMAL LOW (ref 79.5–101.0)
MONO#: 0.3 10*3/uL (ref 0.1–0.9)
MONO%: 8.5 % (ref 0.0–14.0)
NEUT#: 1.7 10*3/uL (ref 1.5–6.5)
NEUT%: 42.7 % (ref 38.4–76.8)
Platelets: 230 10*3/uL (ref 145–400)
RBC: 4.52 10*6/uL (ref 3.70–5.45)
RDW: 15.2 % — ABNORMAL HIGH (ref 11.2–14.5)
Retic %: 1.41 % (ref 0.70–2.10)
Retic Ct Abs: 63.73 10*3/uL (ref 33.70–90.70)
WBC: 3.9 10*3/uL (ref 3.9–10.3)
lymph#: 1.9 10*3/uL (ref 0.9–3.3)
nRBC: 0 % (ref 0–0)

## 2013-10-10 LAB — FERRITIN CHCC: Ferritin: 125 ng/ml (ref 9–269)

## 2013-10-12 LAB — UPEP/TP, 24-HR URINE
Collection Interval: 24 hours
Total Protein, Urine/Day: 68 mg/d (ref 50–100)
Total Protein, Urine: 4 mg/dL
Total Volume, Urine: 1700 mL

## 2013-10-12 LAB — UIFE/LIGHT CHAINS/TP QN, 24-HR UR
Albumin, U: DETECTED
Alpha 1, Urine: DETECTED — AB
Alpha 2, Urine: DETECTED — AB
Beta, Urine: DETECTED — AB
Free Kappa Lt Chains,Ur: 1.31 mg/dL (ref 0.14–2.42)
Free Kappa/Lambda Ratio: 0.17 ratio — ABNORMAL LOW (ref 2.04–10.37)
Free Lambda Excretion/Day: 128.86 mg/d
Free Lambda Lt Chains,Ur: 7.58 mg/dL — ABNORMAL HIGH (ref 0.02–0.67)
Free Lt Chn Excr Rate: 22.27 mg/d
Gamma Globulin, Urine: DETECTED — AB
Time: 24 hours
Total Protein, Urine-Ur/day: 160 mg/d — ABNORMAL HIGH (ref 10–140)
Total Protein, Urine: 9.4 mg/dL
Volume, Urine: 1700 mL

## 2013-10-12 LAB — SPEP & IFE WITH QIG
Albumin ELP: 46.9 % — ABNORMAL LOW (ref 55.8–66.1)
Alpha-1-Globulin: 5.7 % — ABNORMAL HIGH (ref 2.9–4.9)
Alpha-2-Globulin: 7.2 % (ref 7.1–11.8)
Beta 2: 2.4 % — ABNORMAL LOW (ref 3.2–6.5)
Beta Globulin: 4.9 % (ref 4.7–7.2)
Gamma Globulin: 32.9 % — ABNORMAL HIGH (ref 11.1–18.8)
IgA: 23 mg/dL — ABNORMAL LOW (ref 69–380)
IgG (Immunoglobin G), Serum: 3720 mg/dL — ABNORMAL HIGH (ref 690–1700)
IgM, Serum: 32 mg/dL — ABNORMAL LOW (ref 52–322)
M-Spike, %: 2.49 g/dL
Total Protein, Serum Electrophoresis: 8.6 g/dL — ABNORMAL HIGH (ref 6.0–8.3)

## 2013-10-12 LAB — KAPPA/LAMBDA LIGHT CHAINS
Kappa free light chain: 0.72 mg/dL (ref 0.33–1.94)
Kappa:Lambda Ratio: 0.03 — ABNORMAL LOW (ref 0.26–1.65)
Lambda Free Lght Chn: 28.2 mg/dL — ABNORMAL HIGH (ref 0.57–2.63)

## 2013-10-12 LAB — BETA 2 MICROGLOBULIN, SERUM: Beta-2 Microglobulin: 2.32 mg/L — ABNORMAL HIGH (ref 1.01–1.73)

## 2013-10-12 LAB — SEDIMENTATION RATE: Sed Rate: 25 mm/hr — ABNORMAL HIGH (ref 0–22)

## 2013-10-14 ENCOUNTER — Ambulatory Visit
Admission: RE | Admit: 2013-10-14 | Discharge: 2013-10-14 | Disposition: A | Discharge status: Home / Self Care | Payer: 59 | Source: Ambulatory Visit

## 2013-10-14 DIAGNOSIS — Z1231 Encounter for screening mammogram for malignant neoplasm of breast: Secondary | ICD-10-CM

## 2013-10-17 ENCOUNTER — Ambulatory Visit (HOSPITAL_COMMUNITY)
Admission: RE | Admit: 2013-10-17 | Discharge: 2013-10-17 | Disposition: A | Discharge status: Home / Self Care | Payer: 59 | Source: Ambulatory Visit | Attending: Hematology and Oncology | Admitting: Hematology and Oncology

## 2013-10-17 DIAGNOSIS — M47817 Spondylosis without myelopathy or radiculopathy, lumbosacral region: Secondary | ICD-10-CM | POA: Insufficient documentation

## 2013-10-17 DIAGNOSIS — M79609 Pain in unspecified limb: Secondary | ICD-10-CM | POA: Insufficient documentation

## 2013-10-17 DIAGNOSIS — D472 Monoclonal gammopathy: Secondary | ICD-10-CM | POA: Insufficient documentation

## 2013-10-17 DIAGNOSIS — R05 Cough: Secondary | ICD-10-CM | POA: Insufficient documentation

## 2013-10-17 DIAGNOSIS — M25519 Pain in unspecified shoulder: Secondary | ICD-10-CM | POA: Insufficient documentation

## 2013-10-17 DIAGNOSIS — D649 Anemia, unspecified: Secondary | ICD-10-CM

## 2013-10-17 DIAGNOSIS — R059 Cough, unspecified: Secondary | ICD-10-CM | POA: Insufficient documentation

## 2013-10-21 ENCOUNTER — Ambulatory Visit (HOSPITAL_BASED_OUTPATIENT_CLINIC_OR_DEPARTMENT_OTHER): Payer: 59 | Admitting: Hematology and Oncology

## 2013-10-21 ENCOUNTER — Telehealth: Payer: Self-pay | Admitting: *Deleted

## 2013-10-21 ENCOUNTER — Other Ambulatory Visit: Payer: 59 | Admitting: Lab

## 2013-10-21 ENCOUNTER — Other Ambulatory Visit: Payer: Self-pay | Admitting: Hematology and Oncology

## 2013-10-21 ENCOUNTER — Encounter: Payer: Self-pay | Admitting: Hematology and Oncology

## 2013-10-21 VITALS — BP 139/74 | HR 83 | Temp 97.4°F | Resp 20 | Ht 67.0 in | Wt 180.9 lb

## 2013-10-21 DIAGNOSIS — R799 Abnormal finding of blood chemistry, unspecified: Secondary | ICD-10-CM

## 2013-10-21 DIAGNOSIS — M898X9 Other specified disorders of bone, unspecified site: Secondary | ICD-10-CM

## 2013-10-21 DIAGNOSIS — D472 Monoclonal gammopathy: Secondary | ICD-10-CM

## 2013-10-21 DIAGNOSIS — D509 Iron deficiency anemia, unspecified: Secondary | ICD-10-CM

## 2013-10-21 DIAGNOSIS — M899 Disorder of bone, unspecified: Secondary | ICD-10-CM

## 2013-10-21 HISTORY — DX: Other specified disorders of bone, unspecified site: M89.8X9

## 2013-10-21 MED ORDER — TRAMADOL HCL 50 MG PO TABS: 50.0000 mg | ORAL_TABLET | Freq: Four times a day (QID) | ORAL | Status: DC | PRN

## 2013-10-21 NOTE — Progress Notes (Signed)
[facility] OFFICE PROGRESS NOTE  Patient Care Team: Georgann Housekeeper, MD as PCP - General (Internal Medicine)  DIAGNOSIS: Anemia, bone pain, smoldering myeloma  SUMMARY OF ONCOLOGIC HISTORY: This is a pleasant 57 year old lady referred here because of chronic bone pain and anemia. 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 female returns for further followup. She is to have intermittent bone pain which is nonspecific and diffuse.  I have reviewed the past medical history, past surgical history, social history and family history with the patient and they are unchanged from previous note.  ALLERGIES:  has No Known Allergies.  MEDICATIONS:  Current Outpatient Prescriptions  Medication Sig Dispense Refill  . Botulinum Toxin Type A 200 UNITS SOLR Dispense 200 units. To be injected by physician during office visit  1 each  0  . Cholecalciferol (VITAMIN D3) 5000 UNITS CAPS Take by mouth. Every other week      . hydrochlorothiazide (HYDRODIURIL) 25 MG tablet Take 25 mg by mouth daily.      Marland Kitchen levothyroxine (SYNTHROID, LEVOTHROID) 75 MCG tablet Take 75 mcg by mouth daily before breakfast.      . Multiple Vitamins-Minerals (CENTRUM SILVER PO) Take by mouth daily.      . polyethylene glycol (MIRALAX / GLYCOLAX) packet Take 17 g by mouth daily.      Marland Kitchen venlafaxine XR (EFFEXOR XR) 75 MG 24 hr capsule Take 1 capsule (75 mg total) by mouth 2 (two) times daily.  180 capsule  3  . verapamil (VERELAN PM) 120 MG 24 hr capsule Take 1 capsule (120 mg total) by mouth daily.  90 capsule  3  . ferrous sulfate 325 (65 FE) MG tablet Take 325 mg by mouth every other day.      . traMADol (ULTRAM) 50 MG tablet Take 1 tablet (50 mg total) by mouth every 6 (six) hours as needed.  30 tablet  1   No current facility-administered medications for this visit.    REVIEW OF SYSTEMS:   Constitutional: Denies fevers, chills or abnormal weight  loss All other systems were reviewed with the patient and are negative.  PHYSICAL EXAMINATION: ECOG PERFORMANCE STATUS: 1 - Symptomatic but completely ambulatory  Filed Vitals:   10/21/13 0848  BP: 139/74  Pulse: 83  Temp: 97.4 F (36.3 C)  Resp: 20   Filed Weights   10/21/13 0848  Weight: 180 lb 14.4 oz (82.056 kg)    GENERAL:alert, no distress and comfortable NEURO: alert & oriented x 3 with fluent speech, no focal motor/sensory deficits  LABORATORY DATA:  I have reviewed the data as listed    Component Value Date/Time   NA 138 10/10/2013 0908   K 3.5 10/10/2013 0908   CO2 24 10/10/2013 0908   GLUCOSE 121 10/10/2013 0908   BUN 15.6 10/10/2013 0908   CREATININE 0.8 10/10/2013 0908   CALCIUM 9.9 10/10/2013 0908   PROT 9.4* 10/10/2013 0908   ALBUMIN 3.6 10/10/2013 0908   AST 20 10/10/2013 0908   ALT 23 10/10/2013 0908   ALKPHOS 75 10/10/2013 0908   BILITOT 0.34 10/10/2013 0908    No results found for this basename: SPEP, UPEP,  kappa and lambda light chains    Lab Results  Component Value Date   WBC 3.9 10/10/2013   NEUTROABS 1.7 10/10/2013   HGB 10.5* 10/10/2013   HCT 32.8* 10/10/2013   MCV 72.6* 10/10/2013   PLT 230 10/10/2013  Chemistry      Component Value Date/Time   NA 138 10/10/2013 0908   K 3.5 10/10/2013 0908   CO2 24 10/10/2013 0908   BUN 15.6 10/10/2013 0908   CREATININE 0.8 10/10/2013 0908      Component Value Date/Time   CALCIUM 9.9 10/10/2013 0908   ALKPHOS 75 10/10/2013 0908   AST 20 10/10/2013 0908   ALT 23 10/10/2013 0908   BILITOT 0.34 10/10/2013 0908       RADIOGRAPHIC STUDIES: I have personally reviewed the radiological images as listed and agreed with the findings in the report. There were no evidence of lytic lesions or bone fracture   ASSESSMENT & PLAN:  #1 abnormal M spike #2 microcytic anemia I discussed with the patient and her husband the natural history of MGUS, smoldering myeloma and multiple  myeloma. Test results showed no evidence of bone lesions, hypercalcemia or kidney dysfunction. The microcytic anemia could be due to undiagnosed thalassemia. Ferritin level were normal. Due to her symptoms, and very high level of M spike, I recommend we proceed with bone marrow aspirate and biopsy. I discussed with the patient in great detail a sedated bone marrow aspirate and biopsy. Some of the expected side effects include pain, bleeding and rare risk of infection. The patient agreed to proceed to have a sedated bone marrow aspirate and biopsy in the near future. I will go ahead and schedule that to be done at [facility] long short stay I told the patient to discontinue oral iron supplement. #3 bone pain I gave her a small prescription of tramadol to try. Some of the expected side effects including risk of sedation, nausea and constipation were discussed with the patient.All questions were answered. The patient knows to call the clinic with any problems, questions or concerns. No barriers to learning was detected.     Lekha Dancer, MD 10/21/2013 9:33 AM

## 2013-10-21 NOTE — Telephone Encounter (Signed)
Pt scheduled for BMBx at [facility] Short Stay on 11/09/13 at 8 am.  Pt to arrive at 7 am,  NPO after midnight and needs driver home.  Pt verbalized understanding of date/time and instructions.  Notified Lupita Leash in [employer].

## 2013-11-07 ENCOUNTER — Encounter (HOSPITAL_COMMUNITY): Payer: Self-pay | Admitting: Pharmacy Technician

## 2013-11-09 ENCOUNTER — Other Ambulatory Visit: Payer: Self-pay | Admitting: Hematology and Oncology

## 2013-11-09 ENCOUNTER — Ambulatory Visit (HOSPITAL_COMMUNITY)
Admission: RE | Admit: 2013-11-09 | Discharge: 2013-11-09 | Disposition: A | Discharge status: Home / Self Care | Payer: 59 | Source: Ambulatory Visit | Attending: Hematology and Oncology | Admitting: Hematology and Oncology

## 2013-11-09 ENCOUNTER — Encounter (HOSPITAL_COMMUNITY): Payer: Self-pay

## 2013-11-09 ENCOUNTER — Telehealth: Payer: Self-pay | Admitting: Hematology and Oncology

## 2013-11-09 VITALS — BP 131/74 | HR 69 | Temp 97.7°F | Resp 17

## 2013-11-09 DIAGNOSIS — D472 Monoclonal gammopathy: Secondary | ICD-10-CM

## 2013-11-09 DIAGNOSIS — D509 Iron deficiency anemia, unspecified: Secondary | ICD-10-CM | POA: Insufficient documentation

## 2013-11-09 DIAGNOSIS — D499 Neoplasm of unspecified behavior of unspecified site: Secondary | ICD-10-CM | POA: Insufficient documentation

## 2013-11-09 DIAGNOSIS — R7989 Other specified abnormal findings of blood chemistry: Secondary | ICD-10-CM

## 2013-11-09 DIAGNOSIS — D649 Anemia, unspecified: Secondary | ICD-10-CM

## 2013-11-09 LAB — CBC WITH DIFFERENTIAL/PLATELET
Basophils Absolute: 0 10*3/uL (ref 0.0–0.1)
Basophils Relative: 0 % (ref 0–1)
Eosinophils Absolute: 0 10*3/uL (ref 0.0–0.7)
Eosinophils Relative: 0 % (ref 0–5)
HCT: 30 % — ABNORMAL LOW (ref 36.0–46.0)
Hemoglobin: 9.5 g/dL — ABNORMAL LOW (ref 12.0–15.0)
Lymphocytes Relative: 53 % — ABNORMAL HIGH (ref 12–46)
Lymphs Abs: 2.2 10*3/uL (ref 0.7–4.0)
MCH: 22.9 pg — ABNORMAL LOW (ref 26.0–34.0)
MCHC: 31.7 g/dL (ref 30.0–36.0)
MCV: 72.5 fL — ABNORMAL LOW (ref 78.0–100.0)
Monocytes Absolute: 0.4 10*3/uL (ref 0.1–1.0)
Monocytes Relative: 9 % (ref 3–12)
Neutro Abs: 1.6 10*3/uL — ABNORMAL LOW (ref 1.7–7.7)
Neutrophils Relative %: 37 % — ABNORMAL LOW (ref 43–77)
Platelets: 246 10*3/uL (ref 150–400)
RBC: 4.14 MIL/uL (ref 3.87–5.11)
RDW: 15.3 % (ref 11.5–15.5)
WBC: 4.2 10*3/uL (ref 4.0–10.5)

## 2013-11-09 LAB — BONE MARROW EXAM

## 2013-11-09 MED ORDER — MORPHINE SULFATE 10 MG/ML IJ SOLN
INTRAMUSCULAR | Status: AC | PRN
  Administered 2013-11-09 (×4): 1 mg via INTRAVENOUS

## 2013-11-09 MED ORDER — MIDAZOLAM HCL 10 MG/2ML IJ SOLN
10.0000 mg | Freq: Once | INTRAMUSCULAR | Status: DC
  Filled 2013-11-09: qty 2

## 2013-11-09 MED ORDER — MORPHINE SULFATE 10 MG/ML IJ SOLN
10.0000 mg | Freq: Once | INTRAMUSCULAR | Status: DC
  Filled 2013-11-09: qty 1

## 2013-11-09 MED ORDER — MIDAZOLAM HCL 2 MG/2ML IJ SOLN
INTRAMUSCULAR | Status: AC | PRN
  Administered 2013-11-09 (×4): 1 mg via INTRAVENOUS

## 2013-11-09 MED ORDER — SODIUM CHLORIDE 0.9 % IV SOLN: INTRAVENOUS | Status: DC

## 2013-11-09 NOTE — ED Notes (Signed)
Procedure ends. Oozing from site and pressure held x 3 minutes then folded 4x4 with hypafix tape to post iliac left . Pt placed supine for pressure to site

## 2013-11-09 NOTE — ED Notes (Signed)
Family updated as to patient's status.

## 2013-11-09 NOTE — ED Notes (Signed)
Post iliac dressing CDI 

## 2013-11-09 NOTE — ED Notes (Signed)
Patient denies pain and is resting comfortably.  

## 2013-11-09 NOTE — Sedation Documentation (Signed)
Medication dose calculated and verified for: IV VERSED 4mg  IV MORPHINE 4mg 

## 2013-11-09 NOTE — ED Notes (Signed)
Ambulated in room and tolerated this well. Dressing remains CDI ready for discharge

## 2013-11-09 NOTE — ED Notes (Signed)
Patient is resting comfortably. 

## 2013-11-09 NOTE — ED Notes (Signed)
Vital signs stable. 

## 2013-11-09 NOTE — Telephone Encounter (Signed)
appt made per 12/10 POF Pt advised of same shh

## 2013-11-09 NOTE — Procedures (Signed)
Brief examination was performed. ENT: adequate airway clearance Heart: regular rate and rhythm.No Murmurs Lungs: clear to auscultation, no wheezes, normal respiratory effort  Bone Marrow Biopsy and Aspiration Procedure Note   Informed consent was obtained and potential risks including bleeding, infection and pain were reviewed with the patient. I verified that the patient has been fasting since midnight.  The patient's name, date of birth, identification, consent and allergies were verified prior to the start of procedure and time out was performed.  A total of 4 mg of IV Versed and 4 mg of IV morphine were given.  The right posterior iliac crest was chosen as the site of biopsy.  The skin was prepped with Betadine solution.   5 cc of 2% lidocaine was used to provide local anaesthesia.   10 cc of bone marrow aspirate was obtained followed by 1 inch biopsy.   The procedure was tolerated well and there were no complications.  The patient was stable at the end of the procedure.  Specimens sent for flow cytometry, cytogenetics and additional studies.

## 2013-11-14 ENCOUNTER — Telehealth: Payer: Self-pay | Admitting: *Deleted

## 2013-11-14 ENCOUNTER — Encounter: Payer: Self-pay | Admitting: Hematology and Oncology

## 2013-11-14 NOTE — Telephone Encounter (Signed)
Describes the "inflammation of the spine" sensation as her entire body feels as if it is swelling and even feels it is difficult to breathe at the time of the episode. Lasts several minutes. Also developed pain in her back and took Tramadol, repeated 6 hours later with food and became "so weak I couldn't finish cooking dinner". 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 Made her aware that the Tramadol can cause dizziness and drowsiness,which could make her feel the fatigue sensation. Regarding her blood sugar: made her aware that 70-140 is normal range and morning fasting sugar of 89 to 99 is acceptable. Going up to 130 after eating is reasonable to occur. Nothing Dr. Bertis Ruddy has prescribed has affected her blood sugar. Suggested she follow up with her PCP if she has concern about this. Inquired about her bone marrow test results? Told her these will be given to her with in depth discussion on her 12/17 appointment with Dr. Bertis Ruddy.

## 2013-11-16 ENCOUNTER — Encounter: Payer: Self-pay | Admitting: Hematology and Oncology

## 2013-11-16 ENCOUNTER — Telehealth: Payer: Self-pay | Admitting: Hematology and Oncology

## 2013-11-16 ENCOUNTER — Ambulatory Visit (HOSPITAL_BASED_OUTPATIENT_CLINIC_OR_DEPARTMENT_OTHER): Payer: 59 | Admitting: Hematology and Oncology

## 2013-11-16 VITALS — BP 138/74 | HR 74 | Temp 97.8°F | Resp 20 | Ht 67.0 in | Wt 183.1 lb

## 2013-11-16 DIAGNOSIS — C9000 Multiple myeloma not having achieved remission: Secondary | ICD-10-CM

## 2013-11-16 DIAGNOSIS — C9001 Multiple myeloma in remission: Secondary | ICD-10-CM | POA: Insufficient documentation

## 2013-11-16 DIAGNOSIS — M899 Disorder of bone, unspecified: Secondary | ICD-10-CM

## 2013-11-16 DIAGNOSIS — D649 Anemia, unspecified: Secondary | ICD-10-CM

## 2013-11-16 HISTORY — DX: Multiple myeloma not having achieved remission: C90.00

## 2013-11-16 MED ORDER — MORPHINE SULFATE 15 MG PO TABS: 15.0000 mg | ORAL_TABLET | ORAL | Status: DC | PRN

## 2013-11-16 MED ORDER — ONDANSETRON HCL 8 MG PO TABS: 8.0000 mg | ORAL_TABLET | Freq: Three times a day (TID) | ORAL | Status: DC | PRN

## 2013-11-16 MED ORDER — SULFAMETHOXAZOLE-TRIMETHOPRIM 800-160 MG PO TABS: 1.0000 | ORAL_TABLET | ORAL | Status: DC

## 2013-11-16 MED ORDER — LIDOCAINE-PRILOCAINE 2.5-2.5 % EX CREA: 1.0000 "application " | TOPICAL_CREAM | CUTANEOUS | Status: DC | PRN

## 2013-11-16 MED ORDER — ALLOPURINOL 300 MG PO TABS: 300.0000 mg | ORAL_TABLET | Freq: Every day | ORAL | Status: DC

## 2013-11-16 MED ORDER — DEXAMETHASONE 4 MG PO TABS: 4.0000 mg | ORAL_TABLET | ORAL | Status: DC

## 2013-11-16 MED ORDER — ACYCLOVIR 400 MG PO TABS: 400.0000 mg | ORAL_TABLET | Freq: Two times a day (BID) | ORAL | Status: DC

## 2013-11-16 NOTE — Telephone Encounter (Signed)
appts made per 12/17 POF AVS and CAL given IR to call pt to sched port placement shh

## 2013-11-16 NOTE — Patient Instructions (Signed)
Dexamethasone tablets What is this medicine? DEXAMETHASONE (dex a METH a sone) is a corticosteroid. It is commonly used to treat inflammation of the skin, joints, lungs, and other organs. Common conditions treated include asthma, allergies, and arthritis. It is also used for other conditions, such as blood disorders and diseases of the adrenal glands. This medicine may be used for other purposes; ask your health care provider or pharmacist if you have questions. COMMON BRAND NAME(S): Decadron, DexPak Jr TaperPak, DexPak TaperPak, Zema-Pak What should I tell my health care provider before I take this medicine? They need to know if you have any of these conditions: -Cushing's syndrome -diabetes -glaucoma -heart problems or disease -high blood pressure -infection like herpes, measles, tuberculosis, or chickenpox -kidney disease -liver disease -mental problems -myasthenia gravis -osteoporosis -previous heart attack -seizures -stomach, ulcer or intestine disease including colitis and diverticulitis -thyroid problem -an unusual or allergic reaction to dexamethasone, corticosteroids, other medicines, lactose, foods, dyes, or preservatives -pregnant or trying to get pregnant -breast-feeding How should I use this medicine? Take this medicine by mouth with a drink of water. Follow the directions on the prescription label. Take it with food or milk to avoid stomach upset. If you are taking this medicine once a day, take it in the morning. Do not take more medicine than you are told to take. Do not suddenly stop taking your medicine because you may develop a severe reaction. Your doctor will tell you how much medicine to take. If your doctor wants you to stop the medicine, the dose may be slowly lowered over time to avoid any side effects. Talk to your pediatrician regarding the use of this medicine in children. Special care may be needed. Patients over 65 years old may have a stronger reaction and  need a smaller dose. Overdosage: If you think you have taken too much of this medicine contact a poison control center or emergency room at once. NOTE: This medicine is only for you. Do not share this medicine with others. What if I miss a dose? If you miss a dose, take it as soon as you can. If it is almost time for your next dose, talk to your doctor or health care professional. You may need to miss a dose or take an extra dose. Do not take double or extra doses without advice. What may interact with this medicine? Do not take this medicine with any of the following medications: -mifepristone, RU-486 -vaccines This medicine may also interact with the following medications: -amphotericin B -antibiotics like clarithromycin, erythromycin, and troleandomycin -aspirin and aspirin-like drugs -barbiturates like phenobarbital -carbamazepine -cholestyramine -cholinesterase inhibitors like donepezil, galantamine, rivastigmine, and tacrine -cyclosporine -digoxin -diuretics -ephedrine -female hormones, like estrogens or progestins and birth control pills -indinavir -isoniazid -ketoconazole -medicines for diabetes -medicines that improve muscle tone or strength for conditions like myasthenia gravis -NSAIDs, medicines for pain and inflammation, like ibuprofen or naproxen -phenytoin -rifampin -thalidomide -warfarin This list may not describe all possible interactions. Give your health care provider a list of all the medicines, herbs, non-prescription drugs, or dietary supplements you use. Also tell them if you smoke, drink alcohol, or use illegal drugs. Some items may interact with your medicine. What should I watch for while using this medicine? Visit your doctor or health care professional for regular checks on your progress. If you are taking this medicine over a prolonged period, carry an identification card with your name and address, the type and dose of your medicine, and your doctor's  name   and address. This medicine may increase your risk of getting an infection. Stay away from people who are sick. Tell your doctor or health care professional if you are around anyone with measles or chickenpox. If you are going to have surgery, tell your doctor or health care professional that you have taken this medicine within the last twelve months. Ask your doctor or health care professional about your diet. You may need to lower the amount of salt you eat. The medicine can increase your blood sugar. If you are a diabetic check with your doctor if you need help adjusting the dose of your diabetic medicine. What side effects may I notice from receiving this medicine? Side effects that you should report to your doctor or health care professional as soon as possible: -allergic reactions like skin rash, itching or hives, swelling of the face, lips, or tongue -changes in vision -fever, sore throat, sneezing, cough, or other signs of infection, wounds that will not heal -increased thirst -mental depression, mood swings, mistaken feelings of self importance or of being mistreated -pain in hips, back, ribs, arms, shoulders, or legs -redness, blistering, peeling or loosening of the skin, including inside the mouth -trouble passing urine or change in the amount of urine -swelling of feet or lower legs -unusual bleeding or bruising Side effects that usually do not require medical attention (report to your doctor or health care professional if they continue or are bothersome): -headache -nausea, vomiting -skin problems, acne, thin and shiny skin -weight gain This list may not describe all possible side effects. Call your doctor for medical advice about side effects. You may report side effects to FDA at 1-800-FDA-1088. Where should I keep my medicine? Keep out of the reach of children. Store at room temperature between 20 and 25 degrees C (68 and 77 degrees F). Protect from light. Throw away any  unused medicine after the expiration date. NOTE: This sheet is a summary. It may not cover all possible information. If you have questions about this medicine, talk to your doctor, pharmacist, or health care provider.  2014, [employer]. (2008-03-09 14:02:13) Cyclophosphamide injection What is this medicine? CYCLOPHOSPHAMIDE (sye kloe FOSS fa mide) is a chemotherapy drug. It slows the growth of cancer cells. This medicine is used to treat many types of cancer like lymphoma, myeloma, leukemia, breast cancer, and ovarian cancer, to name a few. This medicine may be used for other purposes; ask your health care provider or pharmacist if you have questions. COMMON BRAND NAME(S): Cytoxan, Neosar What should I tell my health care provider before I take this medicine? They need to know if you have any of these conditions: -blood disorders -history of other chemotherapy -infection -kidney disease -liver disease -recent or ongoing radiation therapy -tumors in the bone marrow -an unusual or allergic reaction to cyclophosphamide, other chemotherapy, other medicines, foods, dyes, or preservatives -pregnant or trying to get pregnant -breast-feeding How should I use this medicine? This drug is usually given as an injection into a vein or muscle or by infusion into a vein. It is administered in a hospital or clinic by a specially trained health care professional. Talk to your pediatrician regarding the use of this medicine in children. Special care may be needed. Overdosage: If you think you have taken too much of this medicine contact a poison control center or emergency room at once. NOTE: This medicine is only for you. Do not share this medicine with others. What if I miss a dose? It is  important not to miss your dose. Call your doctor or health care professional if you are unable to keep an appointment. What may interact with this medicine? This medicine may interact with the following  medications: -amiodarone -amphotericin B -azathioprine -certain antiviral medicines for HIV or AIDS such as protease inhibitors (e.g., indinavir, ritonavir) and zidovudine -certain blood pressure medications such as benazepril, captopril, enalapril, fosinopril, lisinopril, moexipril, monopril, perindopril, quinapril, ramipril, trandolapril -certain cancer medications such as anthracyclines (e.g., daunorubicin, doxorubicin), busulfan, cytarabine, paclitaxel, pentostatin, tamoxifen, trastuzumab -certain diuretics such as chlorothiazide, chlorthalidone, hydrochlorothiazide, indapamide, metolazone -certain medicines that treat or prevent blood clots like warfarin -certain muscle relaxants such as succinylcholine -cyclosporine -etanercept -indomethacin -medicines to increase blood counts like filgrastim, pegfilgrastim, sargramostim -medicines used as general anesthesia -metronidazole -natalizumab This list may not describe all possible interactions. Give your health care provider a list of all the medicines, herbs, non-prescription drugs, or dietary supplements you use. Also tell them if you smoke, drink alcohol, or use illegal drugs. Some items may interact with your medicine. What should I watch for while using this medicine? Visit your doctor for checks on your progress. This drug may make you feel generally unwell. This is not uncommon, as chemotherapy can affect healthy cells as well as cancer cells. Report any side effects. Continue your course of treatment even though you feel ill unless your doctor tells you to stop. Drink water or other fluids as directed. Urinate often, even at night. In some cases, you may be given additional medicines to help with side effects. Follow all directions for their use. Call your doctor or health care professional for advice if you get a fever, chills or sore throat, or other symptoms of a cold or flu. Do not treat yourself. This drug decreases your body's  ability to fight infections. Try to avoid being around people who are sick. This medicine may increase your risk to bruise or bleed. Call your doctor or health care professional if you notice any unusual bleeding. Be careful brushing and flossing your teeth or using a toothpick because you may get an infection or bleed more easily. If you have any dental work done, tell your dentist you are receiving this medicine. You may get drowsy or dizzy. Do not drive, use machinery, or do anything that needs mental alertness until you know how this medicine affects you. Do not become pregnant while taking this medicine or for 1 year after stopping it. Women should inform their doctor if they wish to become pregnant or think they might be pregnant. Men should not father a child while taking this medicine and for 4 months after stopping it. There is a potential for serious side effects to an unborn child. Talk to your health care professional or pharmacist for more information. Do not breast-feed an infant while taking this medicine. This medicine may interfere with the ability to have a child. This medicine has caused ovarian failure in some women. This medicine has caused reduced sperm counts in some men. You should talk with your doctor or health care professional if you are concerned about your fertility. If you are going to have surgery, tell your doctor or health care professional that you have taken this medicine. What side effects may I notice from receiving this medicine? Side effects that you should report to your doctor or health care professional as soon as possible: -allergic reactions like skin rash, itching or hives, swelling of the face, lips, or tongue -low blood counts - this  medicine may decrease the number of white blood cells, red blood cells and platelets. You may be at increased risk for infections and bleeding. -signs of infection - fever or chills, cough, sore throat, pain or difficulty  passing urine -signs of decreased platelets or bleeding - bruising, pinpoint red spots on the skin, black, tarry stools, blood in the urine -signs of decreased red blood cells - unusually weak or tired, fainting spells, lightheadedness -breathing problems -dark urine -dizziness -palpitations -swelling of the ankles, feet, hands -trouble passing urine or change in the amount of urine -weight gain -yellowing of the eyes or skin Side effects that usually do not require medical attention (report to your doctor or health care professional if they continue or are bothersome): -changes in nail or skin color -hair loss -missed menstrual periods -mouth sores -nausea, vomiting This list may not describe all possible side effects. Call your doctor for medical advice about side effects. You may report side effects to FDA at 1-800-FDA-1088. Where should I keep my medicine? This drug is given in a hospital or clinic and will not be stored at home. NOTE: This sheet is a summary. It may not cover all possible information. If you have questions about this medicine, talk to your doctor, pharmacist, or health care provider.  2014, [employer]. (2012-10-01 16:22:58) Bortezomib injection What is this medicine? BORTEZOMIB (bor TEZ oh mib) is a chemotherapy drug. It slows the growth of cancer cells. This medicine is used to treat multiple myeloma, lymphoma, and other cancers. This medicine may be used for other purposes; ask your health care provider or pharmacist if you have questions. COMMON BRAND NAME(S): Velcade What should I tell my health care provider before I take this medicine? They need to know if you have any of these conditions: -heart disease -irregular heartbeat -liver disease -low blood counts, like low white blood cells, platelets, or hemoglobin -peripheral neuropathy -taking medicine for blood pressure -an unusual or allergic reaction to bortezomib, mannitol, boron, other  medicines, foods, dyes, or preservatives -pregnant or trying to get pregnant -breast-feeding How should I use this medicine? This medicine is for injection into a vein or for injection under the skin. It is given by a health care professional in a hospital or clinic setting. Talk to your pediatrician regarding the use of this medicine in children. Special care may be needed. Overdosage: If you think you have taken too much of this medicine contact a poison control center or emergency room at once. NOTE: This medicine is only for you. Do not share this medicine with others. What if I miss a dose? It is important not to miss your dose. Call your doctor or health care professional if you are unable to keep an appointment. What may interact with this medicine? -medicines for diabetes -medicines to increase blood counts like filgrastim, pegfilgrastim, sargramostim -zalcitabine Talk to your doctor or health care professional before taking any of these medicines: -acetaminophen -aspirin -ibuprofen -ketoprofen -naproxen This list may not describe all possible interactions. Give your health care provider a list of all the medicines, herbs, non-prescription drugs, or dietary supplements you use. Also tell them if you smoke, drink alcohol, or use illegal drugs. Some items may interact with your medicine. What should I watch for while using this medicine? Visit your doctor for checks on your progress. This drug may make you feel generally unwell. This is not uncommon, as chemotherapy can affect healthy cells as well as cancer cells. Report any side effects. Continue  your course of treatment even though you feel ill unless your doctor tells you to stop. You may get drowsy or dizzy. Do not drive, use machinery, or do anything that needs mental alertness until you know how this medicine affects you. Do not stand or sit up quickly, especially if you are an older patient. This reduces the risk of dizzy or  fainting spells. In some cases, you may be given additional medicines to help with side effects. Follow all directions for their use. Call your doctor or health care professional for advice if you get a fever, chills or sore throat, or other symptoms of a cold or flu. Do not treat yourself. This drug decreases your body's ability to fight infections. Try to avoid being around people who are sick. This medicine may increase your risk to bruise or bleed. Call your doctor or health care professional if you notice any unusual bleeding. Be careful brushing and flossing your teeth or using a toothpick because you may get an infection or bleed more easily. If you have any dental work done, tell your dentist you are receiving this medicine. Avoid taking products that contain aspirin, acetaminophen, ibuprofen, naproxen, or ketoprofen unless instructed by your doctor. These medicines may hide a fever. Do not become pregnant while taking this medicine. Women should inform their doctor if they wish to become pregnant or think they might be pregnant. There is a potential for serious side effects to an unborn child. Talk to your health care professional or pharmacist for more information. Do not breast-feed an infant while taking this medicine. You may have vomiting or diarrhea while taking this medicine. Drink water or other fluids as directed. What side effects may I notice from receiving this medicine? Side effects that you should report to your doctor or health care professional as soon as possible: -allergic reactions like skin rash, itching or hives, swelling of the face, lips, or tongue -breathing problems -changes in hearing -changes in vision -fast, irregular heartbeat -feeling faint or lightheaded, falls -pain, tingling, numbness in the hands or feet -seizures -swelling of the ankles, feet, hands -unusual bleeding or bruising -unusually weak or tired -vomiting Side effects that usually do not  require medical attention (report to your doctor or health care professional if they continue or are bothersome): -changes in emotions or moods -constipation -diarrhea -loss of appetite -headache -irritation at site where injected -nausea This list may not describe all possible side effects. Call your doctor for medical advice about side effects. You may report side effects to FDA at 1-800-FDA-1088. Where should I keep my medicine? This drug is given in a hospital or clinic and will not be stored at home. NOTE: This sheet is a summary. It may not cover all possible information. If you have questions about this medicine, talk to your doctor, pharmacist, or health care provider.  2014, [employer]. (2010-12-25 11:42:36)

## 2013-11-17 ENCOUNTER — Encounter: Payer: Self-pay | Admitting: Hematology and Oncology

## 2013-11-17 ENCOUNTER — Other Ambulatory Visit: Payer: Self-pay | Admitting: Radiology

## 2013-11-17 ENCOUNTER — Telehealth: Payer: Self-pay | Admitting: *Deleted

## 2013-11-17 NOTE — Progress Notes (Signed)
Put [employer] disability form on nurse's desk °

## 2013-11-17 NOTE — Telephone Encounter (Signed)
I called her. Thanks.

## 2013-11-17 NOTE — Progress Notes (Signed)
[facility] OFFICE PROGRESS NOTE  Patient Care Team: Georgann Housekeeper, MD as PCP - General (Internal Medicine) Artis Delay, MD as Consulting Physician (Hematology and Oncology)  DIAGNOSIS: Newly diagnosed multiple myeloma  SUMMARY OF ONCOLOGIC HISTORY: Oncology History   ISS stage 1 IgG lambda subtype (serum albumin 3.6, Beta2 microglobulin 2.32) Durie Salmon Stage 1     Multiple myeloma, without mention of having achieved remission(203.00)   10/10/2013 Imaging Skeletal survery was negative   11/09/2013 Bone Marrow Biopsy BM biopsy confirmed myeloma, 76% involved, IgG lambda subtype    INTERVAL HISTORY: Carla Perry 57 y.o. female returns for further followup. She is to have diffuse body achiness. She complained of profound fatigue  I have reviewed the past medical history, past surgical history, social history and family history with the patient and they are unchanged from previous note.  ALLERGIES:  has No Known Allergies.  MEDICATIONS:  Current Outpatient Prescriptions  Medication Sig Dispense Refill  . Botulinum Toxin Type A 200 UNITS SOLR every 3 (three) months. Dispense 200 units. To be injected by physician during office visit      . Cholecalciferol (VITAMIN D3) 2000 UNITS TABS Take 2,000 Units by mouth daily.      . hydrochlorothiazide (HYDRODIURIL) 25 MG tablet Take 25 mg by mouth daily with breakfast.       . levothyroxine (SYNTHROID, LEVOTHROID) 75 MCG tablet Take 75 mcg by mouth daily before breakfast.      . Multiple Vitamins-Minerals (CENTRUM SILVER PO) Take 1 tablet by mouth daily.       . polyethylene glycol (MIRALAX / GLYCOLAX) packet Take 17 g by mouth daily.      . traMADol (ULTRAM) 50 MG tablet Take 50 mg by mouth every 6 (six) hours as needed for moderate pain or severe pain.      Marland Kitchen venlafaxine XR (EFFEXOR-XR) 75 MG 24 hr capsule Take 75 mg by mouth 2 (two) times daily.      . verapamil (VERELAN PM) 120 MG 24 hr capsule Take 120 mg by mouth at  bedtime.      Marland Kitchen acyclovir (ZOVIRAX) 400 MG tablet Take 1 tablet (400 mg total) by mouth 2 (two) times daily.  60 tablet  3  . allopurinol (ZYLOPRIM) 300 MG tablet Take 1 tablet (300 mg total) by mouth daily.  30 tablet  0  . dexamethasone (DECADRON) 4 MG tablet Take 1 tablet (4 mg total) by mouth as directed. Take 10 tablets every week  100 tablet  3  . lidocaine-prilocaine (EMLA) cream Apply 1 application topically as needed.  30 g  0  . morphine (MSIR) 15 MG tablet Take 1 tablet (15 mg total) by mouth every 4 (four) hours as needed for severe pain.  30 tablet  0  . ondansetron (ZOFRAN) 8 MG tablet Take 1 tablet (8 mg total) by mouth every 8 (eight) hours as needed for nausea.  60 tablet  3  . sulfamethoxazole-trimethoprim (BACTRIM DS,SEPTRA DS) 800-160 MG per tablet Take 1 tablet by mouth as directed. 1 tab twice a day on Mondays, Wednesday and Friday  30 tablet  1   No current facility-administered medications for this visit.    REVIEW OF SYSTEMS:   Constitutional: Denies fevers, chills or abnormal weight loss Behavioral/Psych: Mood is stable, no new changes  All other systems were reviewed with the patient and are negative.  PHYSICAL EXAMINATION: ECOG PERFORMANCE STATUS: 1 - Symptomatic but completely ambulatory  Filed Vitals:   11/16/13  1440  BP: 138/74  Pulse: 74  Temp: 97.8 F (36.6 C)  Resp: 20   Filed Weights   11/16/13 1440  Weight: 183 lb 1.6 oz (83.054 kg)    GENERAL:alert, no distress and comfortable Musculoskeletal:no cyanosis of digits and no clubbing  NEURO: alert & oriented x 3 with fluent speech, no focal motor/sensory deficits  LABORATORY DATA:  I have reviewed the data as listed    Component Value Date/Time   NA 138 10/10/2013 0908   K 3.5 10/10/2013 0908   CO2 24 10/10/2013 0908   GLUCOSE 121 10/10/2013 0908   BUN 15.6 10/10/2013 0908   CREATININE 0.8 10/10/2013 0908   CALCIUM 9.9 10/10/2013 0908   PROT 9.4* 10/10/2013 0908   ALBUMIN 3.6  10/10/2013 0908   AST 20 10/10/2013 0908   ALT 23 10/10/2013 0908   ALKPHOS 75 10/10/2013 0908   BILITOT 0.34 10/10/2013 0908    No results found for this basename: SPEP, UPEP,  kappa and lambda light chains    Lab Results  Component Value Date   WBC 4.2 11/09/2013   NEUTROABS 1.6* 11/09/2013   HGB 9.5* 11/09/2013   HCT 30.0* 11/09/2013   MCV 72.5* 11/09/2013   PLT 246 11/09/2013      Chemistry      Component Value Date/Time   NA 138 10/10/2013 0908   K 3.5 10/10/2013 0908   CO2 24 10/10/2013 0908   BUN 15.6 10/10/2013 0908   CREATININE 0.8 10/10/2013 0908      Component Value Date/Time   CALCIUM 9.9 10/10/2013 0908   ALKPHOS 75 10/10/2013 0908   AST 20 10/10/2013 0908   ALT 23 10/10/2013 0908   BILITOT 0.34 10/10/2013 0908     I reviewed her case recent hematology tumor board and discuss the results with her and her husband  ASSESSMENT & PLAN:  #1 multiple myeloma IgG lambda subtype, ISS stage 1 I discussed with her the natural history of multiple myeloma. We discussed about the role of induction chemotherapy followed by possible autologous stem cell transplant The choice of induction chemotherapy that I felt best to fit her with the combination chemotherapy with weekly Velcade, Cytoxan and dexamethasone (CyBorD) regimen for 4 cycles. After induction chemotherapy, the patient will proceed with possible autologous stem cell transplant, followed by possible maintenance therapy with Revlimid I also recommend supportive care with prophylactic antibiotic coverage with Bactrim as well as acyclovir We discussed the use of aspirin for DVT prophylaxis  I recommend vitamin D supplement as well as monthly Zometa to prevent skeletal events and improve bone health. I suggested the patient make an appointment to see his dentist for dental clearance before we begin intravenous Zometa. I recommend placement of Infuse-a-Port for venous access. I recommend to start her on allopurinol  for possible tumor lysis prophylaxis.  We discussed the role of chemotherapy. The intent is for cure.  We discussed some of the risks, benefits, side-effects of Cytoxan, Bortezemib and Dexamethasone.   Some of the short term side-effects included, though not limited to, risk of fatigue, risk of allergic reactions, mouth sores, weight loss, pancytopenia, life-threatening infections, need for transfusions of blood products, nausea, vomiting, change in bowel habits, blood clots, admission to hospital for various Perry, and risks of death.   Long term side-effects are also discussed including risks of infertility, permanent damage to nerve function, chronic fatigue, and rare secondary malignancy including bone marrow disorders and leukemia.   The patient is aware that the response  rates discussed earlier is not guaranteed.    Patient education materials were dispensed today I recommend the patient to attend chemotherapy education classes and possibly start therapy after Christmas I will consent the patient on the day of her return visit before start on chemotherapy so that the patient have time to research all the information that we just discussed.  #2 anemia This is likely anemia of her underlying disease. The patient denies recent history of bleeding such as epistaxis, hematuria or hematochezia. She is asymptomatic from the anemia. We will observe for now.  She does not require transfusion now.   #3 bone pain This is due to her underlying disease. I give her tramadol before but he was not helpful. I recommend start her on morphine I discussed with her some of the risks and side effects to be expected from morphine such as risk of sedation, nausea and constipation. I also gave her prescription of nausea medicine to take as needed.  There is also a clinical trial available. Patient appeared interested to get enrolled for clinical trial [id number]  Orders Placed This Encounter  Procedures  .  IR Fluoro Guide CV Line Right    Port placement    Standing Status: Future     Number of Occurrences:      Standing Expiration Date: 01/17/2015    Order Specific Question:  Reason for exam:    Answer:  port for chemo    Order Specific Question:  Is the patient pregnant?    Answer:  No    Order Specific Question:  Preferred Imaging Location?    Answer:  [facility]  . Comprehensive metabolic panel    Standing Status: Standing     Number of Occurrences: 9     Standing Expiration Date: 11/16/2014  . CBC with Differential    Standing Status: Standing     Number of Occurrences: 9     Standing Expiration Date: 11/16/2014  . Uric Acid    Standing Status: Standing     Number of Occurrences: 9     Standing Expiration Date: 11/16/2014  . Lactate dehydrogenase    Standing Status: Standing     Number of Occurrences: 9     Standing Expiration Date: 11/16/2014   All questions were answered. The patient knows to call the clinic with any problems, questions or concerns. No barriers to learning was detected.   Mozell Haber, MD 11/17/2013 7:59 AM

## 2013-11-17 NOTE — Telephone Encounter (Signed)
Pt left VM states she was returning a call regarding a clinical trial.

## 2013-11-18 ENCOUNTER — Other Ambulatory Visit: Payer: Self-pay | Admitting: *Deleted

## 2013-11-18 ENCOUNTER — Encounter (HOSPITAL_COMMUNITY): Payer: Self-pay | Admitting: Pharmacy Technician

## 2013-11-18 ENCOUNTER — Telehealth: Payer: Self-pay | Admitting: Hematology and Oncology

## 2013-11-18 ENCOUNTER — Telehealth: Payer: Self-pay | Admitting: *Deleted

## 2013-11-18 DIAGNOSIS — C9000 Multiple myeloma not having achieved remission: Secondary | ICD-10-CM

## 2013-11-18 LAB — CHROMOSOME ANALYSIS, BONE MARROW

## 2013-11-18 LAB — TISSUE HYBRIDIZATION (BONE MARROW)-NCBH

## 2013-11-18 NOTE — Telephone Encounter (Signed)
sw pt and went over Dec and Jan calendars to date shh

## 2013-11-18 NOTE — Telephone Encounter (Signed)
Per staff message and POF I have scheduled appts.  JMW  

## 2013-11-21 ENCOUNTER — Encounter: Payer: Self-pay | Admitting: *Deleted

## 2013-11-21 ENCOUNTER — Other Ambulatory Visit: Payer: Self-pay

## 2013-11-21 ENCOUNTER — Encounter: Payer: 59 | Admitting: *Deleted

## 2013-11-21 ENCOUNTER — Encounter: Payer: Self-pay | Admitting: Hematology and Oncology

## 2013-11-21 ENCOUNTER — Other Ambulatory Visit (HOSPITAL_BASED_OUTPATIENT_CLINIC_OR_DEPARTMENT_OTHER): Payer: 59

## 2013-11-21 DIAGNOSIS — C9000 Multiple myeloma not having achieved remission: Secondary | ICD-10-CM

## 2013-11-21 LAB — CBC WITH DIFFERENTIAL/PLATELET
BASO%: 0.5 % (ref 0.0–2.0)
Basophils Absolute: 0 10*3/uL (ref 0.0–0.1)
EOS%: 0.3 % (ref 0.0–7.0)
Eosinophils Absolute: 0 10*3/uL (ref 0.0–0.5)
HCT: 31.7 % — ABNORMAL LOW (ref 34.8–46.6)
HGB: 9.8 g/dL — ABNORMAL LOW (ref 11.6–15.9)
LYMPH%: 48.3 % (ref 14.0–49.7)
MCH: 22.6 pg — ABNORMAL LOW (ref 25.1–34.0)
MCHC: 30.9 g/dL — ABNORMAL LOW (ref 31.5–36.0)
MCV: 73.2 fL — ABNORMAL LOW (ref 79.5–101.0)
MONO#: 0.5 10*3/uL (ref 0.1–0.9)
MONO%: 11.6 % (ref 0.0–14.0)
NEUT#: 1.7 10*3/uL (ref 1.5–6.5)
NEUT%: 39.3 % (ref 38.4–76.8)
Platelets: 244 10*3/uL (ref 145–400)
RBC: 4.34 10*6/uL (ref 3.70–5.45)
RDW: 14.9 % — ABNORMAL HIGH (ref 11.2–14.5)
WBC: 4.3 10*3/uL (ref 3.9–10.3)
lymph#: 2.1 10*3/uL (ref 0.9–3.3)

## 2013-11-21 LAB — COMPREHENSIVE METABOLIC PANEL (CC13)
ALT: 24 U/L (ref 0–55)
AST: 22 U/L (ref 5–34)
Albumin: 3.7 g/dL (ref 3.5–5.0)
Alkaline Phosphatase: 79 U/L (ref 40–150)
Anion Gap: 8 mEq/L (ref 3–11)
BUN: 16.3 mg/dL (ref 7.0–26.0)
CO2: 31 mEq/L — ABNORMAL HIGH (ref 22–29)
Calcium: 9.7 mg/dL (ref 8.4–10.4)
Chloride: 101 mEq/L (ref 98–109)
Creatinine: 0.9 mg/dL (ref 0.6–1.1)
Glucose: 76 mg/dl (ref 70–140)
Potassium: 3.7 mEq/L (ref 3.5–5.1)
Sodium: 139 mEq/L (ref 136–145)
Total Bilirubin: 0.35 mg/dL (ref 0.20–1.20)
Total Protein: 10.1 g/dL — ABNORMAL HIGH (ref 6.4–8.3)

## 2013-11-21 LAB — TSH CHCC: TSH: 2.351 m(IU)/L (ref 0.308–3.960)

## 2013-11-21 LAB — LACTATE DEHYDROGENASE (CC13): LDH: 191 U/L (ref 125–245)

## 2013-11-21 NOTE — Progress Notes (Signed)
See Consent Form encounter for details regarding today's visit.

## 2013-11-21 NOTE — Progress Notes (Signed)
Faxed disability paper with clinical information to [facility] @ [phone].

## 2013-11-22 ENCOUNTER — Ambulatory Visit (HOSPITAL_COMMUNITY)
Admission: RE | Admit: 2013-11-22 | Discharge: 2013-11-22 | Disposition: A | Discharge status: Home / Self Care | Payer: 59 | Source: Ambulatory Visit | Attending: Hematology and Oncology | Admitting: Hematology and Oncology

## 2013-11-22 ENCOUNTER — Telehealth: Payer: Self-pay | Admitting: *Deleted

## 2013-11-22 ENCOUNTER — Encounter (HOSPITAL_COMMUNITY): Payer: Self-pay

## 2013-11-22 ENCOUNTER — Telehealth: Payer: Self-pay | Admitting: Hematology and Oncology

## 2013-11-22 ENCOUNTER — Other Ambulatory Visit: Payer: Self-pay | Admitting: Hematology and Oncology

## 2013-11-22 ENCOUNTER — Other Ambulatory Visit: Payer: 59

## 2013-11-22 ENCOUNTER — Other Ambulatory Visit (HOSPITAL_COMMUNITY): Payer: Self-pay | Admitting: Physician Assistant

## 2013-11-22 DIAGNOSIS — Z79899 Other long term (current) drug therapy: Secondary | ICD-10-CM | POA: Insufficient documentation

## 2013-11-22 DIAGNOSIS — C9000 Multiple myeloma not having achieved remission: Secondary | ICD-10-CM | POA: Insufficient documentation

## 2013-11-22 DIAGNOSIS — D649 Anemia, unspecified: Secondary | ICD-10-CM | POA: Insufficient documentation

## 2013-11-22 DIAGNOSIS — I10 Essential (primary) hypertension: Secondary | ICD-10-CM | POA: Insufficient documentation

## 2013-11-22 DIAGNOSIS — E079 Disorder of thyroid, unspecified: Secondary | ICD-10-CM | POA: Insufficient documentation

## 2013-11-22 LAB — APTT: aPTT: 24 seconds (ref 24–37)

## 2013-11-22 LAB — CBC
HCT: 30.4 % — ABNORMAL LOW (ref 36.0–46.0)
Hemoglobin: 9.7 g/dL — ABNORMAL LOW (ref 12.0–15.0)
MCH: 23.5 pg — ABNORMAL LOW (ref 26.0–34.0)
MCHC: 31.9 g/dL (ref 30.0–36.0)
MCV: 73.6 fL — ABNORMAL LOW (ref 78.0–100.0)
Platelets: 247 10*3/uL (ref 150–400)
RBC: 4.13 MIL/uL (ref 3.87–5.11)
RDW: 15.4 % (ref 11.5–15.5)
WBC: 4.3 10*3/uL (ref 4.0–10.5)

## 2013-11-22 LAB — PROTIME-INR
INR: 1.07 (ref 0.00–1.49)
Prothrombin Time: 13.7 seconds (ref 11.6–15.2)

## 2013-11-22 MED ORDER — CEFAZOLIN SODIUM-DEXTROSE 2-3 GM-% IV SOLR
2.0000 g | INTRAVENOUS | Status: AC
  Administered 2013-11-22: 2 g via INTRAVENOUS
  Filled 2013-11-22: qty 50

## 2013-11-22 MED ORDER — MIDAZOLAM HCL 2 MG/2ML IJ SOLN
INTRAMUSCULAR | Status: AC | PRN
  Administered 2013-11-22 (×2): 2 mg via INTRAVENOUS

## 2013-11-22 MED ORDER — LIDOCAINE-EPINEPHRINE (PF) 2 %-1:200000 IJ SOLN
INTRAMUSCULAR | Status: AC
  Filled 2013-11-22: qty 20

## 2013-11-22 MED ORDER — HEPARIN SOD (PORK) LOCK FLUSH 100 UNIT/ML IV SOLN
INTRAVENOUS | Status: AC
  Filled 2013-11-22: qty 5

## 2013-11-22 MED ORDER — MIDAZOLAM HCL 2 MG/2ML IJ SOLN
INTRAMUSCULAR | Status: AC
  Filled 2013-11-22: qty 4

## 2013-11-22 MED ORDER — FENTANYL CITRATE 0.05 MG/ML IJ SOLN
INTRAMUSCULAR | Status: AC
  Filled 2013-11-22: qty 4

## 2013-11-22 MED ORDER — FENTANYL CITRATE 0.05 MG/ML IJ SOLN
INTRAMUSCULAR | Status: AC | PRN
  Administered 2013-11-22: 100 ug via INTRAVENOUS

## 2013-11-22 MED ORDER — HEPARIN SOD (PORK) LOCK FLUSH 100 UNIT/ML IV SOLN
500.0000 [IU] | Freq: Once | INTRAVENOUS | Status: AC
  Administered 2013-11-22: 500 [IU] via INTRAVENOUS

## 2013-11-22 MED ORDER — SODIUM CHLORIDE 0.9 % IV SOLN
INTRAVENOUS | Status: DC
  Administered 2013-11-22: 12:00:00 via INTRAVENOUS

## 2013-11-22 NOTE — Telephone Encounter (Signed)
appts made per 12/23 POF Email to MW to add tx shh

## 2013-11-22 NOTE — Telephone Encounter (Signed)
Per staff message and POF I have scheduled appts.  JMW  

## 2013-11-22 NOTE — H&P (Signed)
Carla Perry is an 57 y.o. female.   Chief Complaint: "I'm here to get a port put in" HPI: Patient with history of multiple myeloma presents today for port a cath placement for chemotherapy.  Past Medical History  Diagnosis Date  . HBP (high blood pressure)   . Thyroid disorder   . Migraine   . Seizure   . Memory loss   . Anemia   . MGUS (monoclonal gammopathy of unknown significance)   . MGUS (monoclonal gammopathy of unknown significance) 10/06/2013  . Anemia, unspecified 10/06/2013  . Bone pain 10/21/2013  . Multiple myeloma, without mention of having achieved remission(203.00) 11/16/2013    Past Surgical History  Procedure Laterality Date  . Hemorrhoid surgery      Family History  Problem Relation Age of Onset  . Throat cancer Father   . Stroke Father   . Cancer Brother     prostate   Social History:  reports that she has never smoked. She has never used smokeless tobacco. She reports that she does not drink alcohol or use illicit drugs.  Allergies: No Known Allergies  Current outpatient prescriptions:allopurinol (ZYLOPRIM) 300 MG tablet, Take 300 mg by mouth daily with breakfast., Disp: , Rfl: ;  aspirin 81 MG tablet, Take 81 mg by mouth daily., Disp: , Rfl: ;  Cholecalciferol (VITAMIN D3) 2000 UNITS TABS, Take 2,000 Units by mouth daily., Disp: , Rfl: ;  hydrochlorothiazide (HYDRODIURIL) 25 MG tablet, Take 25 mg by mouth daily with breakfast. , Disp: , Rfl:  levothyroxine (SYNTHROID, LEVOTHROID) 75 MCG tablet, Take 75 mcg by mouth daily before breakfast., Disp: , Rfl: ;  Multiple Vitamins-Minerals (CENTRUM SILVER PO), Take 1 tablet by mouth daily. , Disp: , Rfl: ;  polyethylene glycol (MIRALAX / GLYCOLAX) packet, Take 17 g by mouth daily., Disp: , Rfl: ;  venlafaxine XR (EFFEXOR-XR) 75 MG 24 hr capsule, Take 75 mg by mouth 2 (two) times daily., Disp: , Rfl:  verapamil (VERELAN PM) 120 MG 24 hr capsule, Take 120 mg by mouth at bedtime., Disp: , Rfl: ;  acyclovir (ZOVIRAX)  400 MG tablet, Take 400 mg by mouth 2 (two) times daily., Disp: , Rfl: ;  Botulinum Toxin Type A 200 UNITS SOLR, every 3 (three) months. Dispense 200 units. To be injected by physician during office visit, Disp: , Rfl: ;  dexamethasone (DECADRON) 4 MG tablet, Take 4 mg by mouth as directed. Take 10 tablets every week, Disp: , Rfl:  lidocaine-prilocaine (EMLA) cream, Apply 1 application topically as needed (port)., Disp: , Rfl: ;  morphine (MSIR) 15 MG tablet, Take 15 mg by mouth every 4 (four) hours as needed for moderate pain or severe pain., Disp: , Rfl: ;  ondansetron (ZOFRAN) 8 MG tablet, Take 8 mg by mouth every 8 (eight) hours as needed for nausea., Disp: , Rfl:  sulfamethoxazole-trimethoprim (BACTRIM DS,SEPTRA DS) 800-160 MG per tablet, Take 1 tablet by mouth as directed. 1 tab twice a day on Mondays, Wednesday and Friday, Disp: , Rfl: ;  traMADol (ULTRAM) 50 MG tablet, Take 50 mg by mouth every 6 (six) hours as needed for moderate pain or severe pain., Disp: , Rfl:  Current facility-administered medications:0.9 %  sodium chloride infusion, , Intravenous, Continuous, D Kevin Allred, PA-C, Last Rate: 20 mL/hr at 11/22/13 1139;  ceFAZolin (ANCEF) IVPB 2 g/50 mL premix, 2 g, Intravenous, On Call, D Jeananne Rama, PA-C   Results for orders placed during the hospital encounter of 11/22/13 (from the past 48  hour(s))  APTT     Status: None   Collection Time    11/22/13 11:35 AM      Result Value Range   aPTT 24  24 - 37 seconds  CBC     Status: Abnormal   Collection Time    11/22/13 11:35 AM      Result Value Range   WBC 4.3  4.0 - 10.5 K/uL   RBC 4.13  3.87 - 5.11 MIL/uL   Hemoglobin 9.7 (*) 12.0 - 15.0 g/dL   HCT 16.1 (*) 09.6 - 04.5 %   MCV 73.6 (*) 78.0 - 100.0 fL   MCH 23.5 (*) 26.0 - 34.0 pg   MCHC 31.9  30.0 - 36.0 g/dL   RDW 40.9  81.1 - 91.4 %   Platelets 247  150 - 400 K/uL  PROTIME-INR     Status: None   Collection Time    11/22/13 11:35 AM      Result Value Range   Prothrombin  Time 13.7  11.6 - 15.2 seconds   INR 1.07  0.00 - 1.49   No results found.  Review of Systems  Constitutional: Negative for fever and chills.  Respiratory: Negative for cough and shortness of breath.   Cardiovascular: Negative for chest pain.  Gastrointestinal: Negative for nausea, vomiting and abdominal pain.  Musculoskeletal: Positive for back pain.  Neurological: Negative for headaches.  Endo/Heme/Allergies: Does not bruise/bleed easily.    Blood pressure 125/79, pulse 66, temperature 98.4 F (36.9 C), temperature source Oral, resp. rate 16, SpO2 99.00%. Physical Exam  Constitutional: She is oriented to person, place, and time. She appears well-developed and well-nourished.  Cardiovascular: Normal rate and regular rhythm.   Respiratory: Effort normal and breath sounds normal.  GI: Soft. Bowel sounds are normal. There is no tenderness.  Musculoskeletal: Normal range of motion. She exhibits no edema.  Neurological: She is alert and oriented to person, place, and time.     Assessment/Plan Pt with recently diagnosed multiple myeloma. Plan is for port a cath placement today for chemotherapy. Details/risks of procedure d/w pt/husband with their understanding and consent.  ALLRED,D KEVIN 11/22/2013, 12:47 PM

## 2013-11-22 NOTE — Procedures (Signed)
Successful placement of right IJ approach port-a-cath with tip at the superior caval atrial junction. The catheter is ready for immediate use. No immediate post procedural complications. 

## 2013-11-23 ENCOUNTER — Encounter: Payer: Self-pay | Admitting: *Deleted

## 2013-11-23 LAB — SPEP & IFE WITH QIG
Albumin ELP: 44.8 % — ABNORMAL LOW (ref 55.8–66.1)
Alpha-1-Globulin: 5.8 % — ABNORMAL HIGH (ref 2.9–4.9)
Alpha-2-Globulin: 7.5 % (ref 7.1–11.8)
Beta 2: 2.5 % — ABNORMAL LOW (ref 3.2–6.5)
Beta Globulin: 4.9 % (ref 4.7–7.2)
Gamma Globulin: 34.5 % — ABNORMAL HIGH (ref 11.1–18.8)
IgA: 22 mg/dL — ABNORMAL LOW (ref 69–380)
IgG (Immunoglobin G), Serum: 3940 mg/dL — ABNORMAL HIGH (ref 690–1700)
IgM, Serum: 33 mg/dL — ABNORMAL LOW (ref 52–322)
M-Spike, %: 2.86 g/dL
Total Protein, Serum Electrophoresis: 9.3 g/dL — ABNORMAL HIGH (ref 6.0–8.3)

## 2013-11-23 LAB — KAPPA/LAMBDA LIGHT CHAINS
Kappa free light chain: 0.71 mg/dL (ref 0.33–1.94)
Kappa:Lambda Ratio: 0.02 — ABNORMAL LOW (ref 0.26–1.65)
Lambda Free Lght Chn: 37.9 mg/dL — ABNORMAL HIGH (ref 0.57–2.63)

## 2013-11-23 LAB — C-REACTIVE PROTEIN: CRP: 0.5 mg/dL (ref <0.60)

## 2013-11-23 LAB — BETA 2 MICROGLOBULIN, SERUM: Beta-2 Microglobulin: 3.38 mg/L — ABNORMAL HIGH (ref 1.01–1.73)

## 2013-11-25 ENCOUNTER — Other Ambulatory Visit: Payer: Self-pay

## 2013-11-25 ENCOUNTER — Other Ambulatory Visit: Payer: 59

## 2013-11-25 ENCOUNTER — Ambulatory Visit: Payer: 59

## 2013-11-25 LAB — UPEP/TP, 24-HR URINE
Collection Interval: 24 hours
Total Protein, Urine/Day: 96 mg/d (ref 50–100)
Total Protein, Urine: 6 mg/dL
Total Volume, Urine: 1600 mL

## 2013-11-25 LAB — UIFE/LIGHT CHAINS/TP QN, 24-HR UR
Albumin, U: DETECTED
Alpha 1, Urine: DETECTED — AB
Alpha 2, Urine: DETECTED — AB
Beta, Urine: DETECTED — AB
Free Kappa Lt Chains,Ur: 1.59 mg/dL (ref 0.14–2.42)
Free Kappa/Lambda Ratio: 0.12 ratio — ABNORMAL LOW (ref 2.04–10.37)
Free Lambda Excretion/Day: 212.8 mg/d
Free Lambda Lt Chains,Ur: 13.3 mg/dL — ABNORMAL HIGH (ref 0.02–0.67)
Free Lt Chn Excr Rate: 25.44 mg/d
Gamma Globulin, Urine: DETECTED — AB
Time: 24 hours
Total Protein, Urine-Ur/day: 246 mg/d — ABNORMAL HIGH (ref 10–140)
Total Protein, Urine: 15.4 mg/dL
Volume, Urine: 1600 mL

## 2013-11-25 MED ORDER — VENLAFAXINE HCL ER 75 MG PO CP24: 75.0000 mg | ORAL_CAPSULE | Freq: Two times a day (BID) | ORAL | Status: DC

## 2013-11-28 ENCOUNTER — Telehealth: Payer: Self-pay | Admitting: *Deleted

## 2013-11-28 ENCOUNTER — Other Ambulatory Visit: Payer: Self-pay | Admitting: Hematology and Oncology

## 2013-11-28 ENCOUNTER — Encounter (HOSPITAL_COMMUNITY): Payer: Self-pay

## 2013-11-28 ENCOUNTER — Encounter: Payer: Self-pay | Admitting: Hematology and Oncology

## 2013-11-28 DIAGNOSIS — C9000 Multiple myeloma not having achieved remission: Secondary | ICD-10-CM

## 2013-11-28 MED ORDER — LENALIDOMIDE 25 MG PO CAPS: 25.0000 mg | ORAL_CAPSULE | Freq: Every day | ORAL | Status: DC

## 2013-11-28 NOTE — Telephone Encounter (Signed)
[facility] faxed confirmation of facsimile receipt for Revlimid prescription referral.  [facility] will verify insurance and make delivery arrangements with patient.

## 2013-11-28 NOTE — Telephone Encounter (Signed)
Facsimile received from [employer].  Patient has successfully been enrolled in the Revlimid REMS Program.  She must take a survey every 6 months with a prescriber survey every 30 days.

## 2013-11-28 NOTE — Telephone Encounter (Signed)
Pt came into clinic to sign consent/enrollment form for [employer].   She also dropped off Disability paperwork.  Pt enrolled in Revllmid REMS and I called pt to take pt survey,  Gave her the phone number.  She verbalized understanding.  Forwarded disability paperwork to [employer] in [facility] dept.  Also gave Rx for the Revlimid to [facility] for prior auth..   REMS Auth # for Revlimid is [id number].

## 2013-11-28 NOTE — Progress Notes (Signed)
Faxed revlimid prescription to [facility] °

## 2013-11-29 ENCOUNTER — Other Ambulatory Visit: Payer: 59

## 2013-11-29 ENCOUNTER — Other Ambulatory Visit: Payer: Self-pay | Admitting: Neurology

## 2013-11-29 ENCOUNTER — Ambulatory Visit: Payer: 59 | Admitting: Hematology and Oncology

## 2013-11-29 ENCOUNTER — Other Ambulatory Visit: Payer: Self-pay | Admitting: *Deleted

## 2013-11-29 DIAGNOSIS — C9000 Multiple myeloma not having achieved remission: Secondary | ICD-10-CM

## 2013-11-29 MED ORDER — VENLAFAXINE HCL ER 75 MG PO CP24: 75.0000 mg | ORAL_CAPSULE | Freq: Two times a day (BID) | ORAL | Status: DC

## 2013-11-29 NOTE — Telephone Encounter (Signed)
[employer] faxed approval for Revlimid through 11-29-2014.  Will notify providers.

## 2013-11-30 ENCOUNTER — Telehealth: Payer: Self-pay | Admitting: *Deleted

## 2013-11-30 ENCOUNTER — Other Ambulatory Visit: Payer: 59

## 2013-11-30 ENCOUNTER — Other Ambulatory Visit: Payer: Self-pay | Admitting: Hematology and Oncology

## 2013-11-30 ENCOUNTER — Telehealth: Payer: Self-pay | Admitting: Hematology and Oncology

## 2013-11-30 ENCOUNTER — Ambulatory Visit: Payer: 59 | Admitting: Hematology and Oncology

## 2013-11-30 DIAGNOSIS — C9000 Multiple myeloma not having achieved remission: Secondary | ICD-10-CM

## 2013-11-30 MED ORDER — PROCHLORPERAZINE MALEATE 10 MG PO TABS: 10.0000 mg | ORAL_TABLET | Freq: Four times a day (QID) | ORAL | Status: DC | PRN

## 2013-11-30 MED ORDER — ONDANSETRON HCL 8 MG PO TABS: 8.0000 mg | ORAL_TABLET | Freq: Three times a day (TID) | ORAL | Status: DC | PRN

## 2013-11-30 NOTE — Telephone Encounter (Signed)
s/w pt re appts for 1/9, 1/13, 1/16. also confirmed 1/6 appts and pt will get new schedule when she comes in

## 2013-11-30 NOTE — Telephone Encounter (Signed)
Pt left VM states she should have Revlimid by this Friday.

## 2013-12-01 HISTORY — PX: PORTACATH PLACEMENT: SHX2246

## 2013-12-02 ENCOUNTER — Ambulatory Visit: Payer: 59

## 2013-12-02 ENCOUNTER — Other Ambulatory Visit: Payer: Self-pay | Admitting: Hematology and Oncology

## 2013-12-02 ENCOUNTER — Telehealth: Payer: Self-pay | Admitting: *Deleted

## 2013-12-02 NOTE — Telephone Encounter (Signed)
12/02/2013 Received telephone call from patient today stating that she has received her lenalidomide prescription. Also confirmed that patient has picked up her prescriptions for dexamethasone, anti-emetics and full-dose aspirin (OTC). Patient states she also has a prescription for prophylactic acyclovir. Patient will bring all medications to clinic on Tuesday for review at treatment initiation. Patient aware that port will likely not be used as treatment will be administered subcutaneously at this time. Patient will bring EMLA cream in case it is needed. [name] BSN, RN, [place] 12/02/2013 1:58 PM

## 2013-12-02 NOTE — Telephone Encounter (Signed)
She will actually need zometa Can you verify with her that she has a recent dental check? If so, I will order Zometa for her

## 2013-12-02 NOTE — Telephone Encounter (Signed)
Received fax- Revlimid shipped 11/30/13

## 2013-12-06 ENCOUNTER — Other Ambulatory Visit: Payer: Self-pay | Admitting: *Deleted

## 2013-12-06 ENCOUNTER — Telehealth: Payer: Self-pay | Admitting: Hematology and Oncology

## 2013-12-06 ENCOUNTER — Other Ambulatory Visit: Payer: Self-pay | Admitting: Hematology and Oncology

## 2013-12-06 ENCOUNTER — Telehealth: Payer: Self-pay | Admitting: *Deleted

## 2013-12-06 ENCOUNTER — Ambulatory Visit (HOSPITAL_BASED_OUTPATIENT_CLINIC_OR_DEPARTMENT_OTHER): Payer: 59

## 2013-12-06 ENCOUNTER — Ambulatory Visit (HOSPITAL_BASED_OUTPATIENT_CLINIC_OR_DEPARTMENT_OTHER): Payer: 59 | Admitting: Hematology and Oncology

## 2013-12-06 ENCOUNTER — Encounter: Payer: 59 | Admitting: *Deleted

## 2013-12-06 ENCOUNTER — Other Ambulatory Visit (HOSPITAL_BASED_OUTPATIENT_CLINIC_OR_DEPARTMENT_OTHER): Payer: 59

## 2013-12-06 VITALS — BP 122/75 | HR 72 | Temp 98.2°F | Resp 18

## 2013-12-06 VITALS — BP 138/70 | HR 87 | Temp 98.4°F | Resp 18 | Ht 67.0 in | Wt 184.8 lb

## 2013-12-06 DIAGNOSIS — C9000 Multiple myeloma not having achieved remission: Secondary | ICD-10-CM

## 2013-12-06 DIAGNOSIS — M899 Disorder of bone, unspecified: Secondary | ICD-10-CM

## 2013-12-06 DIAGNOSIS — M949 Disorder of cartilage, unspecified: Secondary | ICD-10-CM

## 2013-12-06 DIAGNOSIS — D649 Anemia, unspecified: Secondary | ICD-10-CM

## 2013-12-06 DIAGNOSIS — M898X9 Other specified disorders of bone, unspecified site: Secondary | ICD-10-CM

## 2013-12-06 DIAGNOSIS — Z5112 Encounter for antineoplastic immunotherapy: Secondary | ICD-10-CM

## 2013-12-06 LAB — COMPREHENSIVE METABOLIC PANEL (CC13)
ALT: 28 U/L (ref 0–55)
AST: 21 U/L (ref 5–34)
Albumin: 3.5 g/dL (ref 3.5–5.0)
Alkaline Phosphatase: 73 U/L (ref 40–150)
Anion Gap: 6 mEq/L (ref 3–11)
BUN: 15.5 mg/dL (ref 7.0–26.0)
CO2: 28 mEq/L (ref 22–29)
Calcium: 9.4 mg/dL (ref 8.4–10.4)
Chloride: 103 mEq/L (ref 98–109)
Creatinine: 0.9 mg/dL (ref 0.6–1.1)
Glucose: 129 mg/dl (ref 70–140)
Potassium: 3.7 mEq/L (ref 3.5–5.1)
Sodium: 138 mEq/L (ref 136–145)
Total Bilirubin: 0.26 mg/dL (ref 0.20–1.20)
Total Protein: 9.5 g/dL — ABNORMAL HIGH (ref 6.4–8.3)

## 2013-12-06 LAB — CBC WITH DIFFERENTIAL/PLATELET
BASO%: 0.3 % (ref 0.0–2.0)
Basophils Absolute: 0 10*3/uL (ref 0.0–0.1)
EOS%: 0.3 % (ref 0.0–7.0)
Eosinophils Absolute: 0 10*3/uL (ref 0.0–0.5)
HCT: 30.2 % — ABNORMAL LOW (ref 34.8–46.6)
HGB: 9.6 g/dL — ABNORMAL LOW (ref 11.6–15.9)
LYMPH%: 52.9 % — ABNORMAL HIGH (ref 14.0–49.7)
MCH: 23.2 pg — ABNORMAL LOW (ref 25.1–34.0)
MCHC: 31.7 g/dL (ref 31.5–36.0)
MCV: 73.3 fL — ABNORMAL LOW (ref 79.5–101.0)
MONO#: 0.4 10*3/uL (ref 0.1–0.9)
MONO%: 10.8 % (ref 0.0–14.0)
NEUT#: 1.3 10*3/uL — ABNORMAL LOW (ref 1.5–6.5)
NEUT%: 35.7 % — ABNORMAL LOW (ref 38.4–76.8)
Platelets: 231 10*3/uL (ref 145–400)
RBC: 4.13 10*6/uL (ref 3.70–5.45)
RDW: 15.1 % — ABNORMAL HIGH (ref 11.2–14.5)
WBC: 3.7 10*3/uL — ABNORMAL LOW (ref 3.9–10.3)
lymph#: 2 10*3/uL (ref 0.9–3.3)

## 2013-12-06 LAB — LACTATE DEHYDROGENASE (CC13): LDH: 184 U/L (ref 125–245)

## 2013-12-06 LAB — URIC ACID (CC13): Uric Acid, Serum: 4.6 mg/dl (ref 2.6–7.4)

## 2013-12-06 LAB — RESEARCH LABS

## 2013-12-06 MED ORDER — ONDANSETRON HCL 8 MG PO TABS
8.0000 mg | ORAL_TABLET | Freq: Once | ORAL | Status: AC
  Administered 2013-12-06: 8 mg via ORAL

## 2013-12-06 MED ORDER — HEPARIN SOD (PORK) LOCK FLUSH 100 UNIT/ML IV SOLN
500.0000 [IU] | Freq: Once | INTRAVENOUS | Status: AC
  Administered 2013-12-06: 500 [IU] via INTRAVENOUS
  Filled 2013-12-06: qty 5

## 2013-12-06 MED ORDER — ZOLEDRONIC ACID 4 MG/100ML IV SOLN
4.0000 mg | Freq: Once | INTRAVENOUS | Status: AC
  Administered 2013-12-06: 4 mg via INTRAVENOUS
  Filled 2013-12-06: qty 100

## 2013-12-06 MED ORDER — INV-BORTEZOMIB CHEMO SQ INJECTION 3.5 MG CTSU E1A11
1.3000 mg/m2 | Freq: Once | INTRAVENOUS | Status: AC
  Administered 2013-12-06: 2.5 mg via SUBCUTANEOUS
  Filled 2013-12-06: qty 2.5

## 2013-12-06 MED ORDER — ONDANSETRON HCL 8 MG PO TABS
ORAL_TABLET | ORAL | Status: AC
  Filled 2013-12-06: qty 1

## 2013-12-06 MED ORDER — SODIUM CHLORIDE 0.9 % IJ SOLN
10.0000 mL | INTRAMUSCULAR | Status: DC | PRN
  Administered 2013-12-06: 10 mL via INTRAVENOUS
  Filled 2013-12-06: qty 10

## 2013-12-06 MED ORDER — SODIUM CHLORIDE 0.9 % IV SOLN
Freq: Once | INTRAVENOUS | Status: AC
  Administered 2013-12-06: 15:00:00 via INTRAVENOUS

## 2013-12-06 NOTE — Telephone Encounter (Signed)
Per staff phone call and POF I have schedueld appts.  JMW  

## 2013-12-06 NOTE — Telephone Encounter (Signed)
Called pt left message regarding lab,md and chemo for january and February 2015

## 2013-12-06 NOTE — Progress Notes (Signed)
12/06/2013 Patient in to clinic today for evaluation and initiation of treatment Cycle 1. Based on lab results review and history and physical exam by Dr. Alvy Perry, patient condition is acceptable to begin treatment today. Note that baseline QOL was completed by the patient on 11/21/2013 after consent, during the screening process. Information on Zometa, full dose aspirin, and prophylactic acyclovir and Bactrim, per MD preference, were reviewed with the patient by Dr. Alvy Perry. Patient Medication Calendar was given to patient along with explanation of its use. Initial doses of lenalidomide and dexamethasone were taken in clinic today and use of medication calendar was demonstrated. Patient was instructed to bring her medication calendar to her clinic visits, noting that pill bottles should be brought to clinic for review each treatment cycle. Sign for infusion given to Carla Gasser RN. Carla Perry BSN, RN, [place] 12/06/2013 2:41 PM

## 2013-12-06 NOTE — Progress Notes (Signed)
[facility] OFFICE PROGRESS NOTE  Patient Care Team: Wenda Low, MD as PCP - General (Internal Medicine) Heath Lark, MD as Consulting Physician (Hematology and Oncology)  DIAGNOSIS: IgG lambda multiple myeloma  SUMMARY OF ONCOLOGIC HISTORY: Oncology History   ISS stage 1 IgG lambda subtype (serum albumin 3.6, Beta2 microglobulin 2.32) Durie Salmon Stage 1     Multiple myeloma, without mention of having achieved remission(203.00)   10/10/2013 Imaging Skeletal survery was negative   11/09/2013 Bone Marrow Biopsy BM biopsy confirmed myeloma, 76% involved, IgG lambda subtype   12/06/2013 -  Chemotherapy Start cycle 1 of chemo with revlimid, Velcade, Dexamethasone and Zometa. Patient particpated in clinical research [facility] [phone]    INTERVAL HISTORY: Carla Perry 58 y.o. female returns for further followup. She is to have diffuse bony achiness. She still has some fatigue and currently on short-term disability. Denies any bone pain.  I have reviewed the past medical history, past surgical history, social history and family history with the patient and they are unchanged from previous note.  ALLERGIES:  has No Known Allergies.  MEDICATIONS:  Current Outpatient Prescriptions  Medication Sig Dispense Refill  . acyclovir (ZOVIRAX) 400 MG tablet Take 400 mg by mouth 2 (two) times daily.      Marland Kitchen allopurinol (ZYLOPRIM) 300 MG tablet Take 300 mg by mouth daily with breakfast.      . aspirin 81 MG tablet Take 81 mg by mouth daily.      . Botulinum Toxin Type A 200 UNITS SOLR every 3 (three) months. Dispense 200 units. To be injected by physician during office visit      . Cholecalciferol (VITAMIN D3) 2000 UNITS TABS Take 2,000 Units by mouth daily.      Marland Kitchen dexamethasone (DECADRON) 4 MG tablet Take 4 mg by mouth as directed. Take 10 tablets every week      . hydrochlorothiazide (HYDRODIURIL) 25 MG tablet Take 25 mg by mouth daily with breakfast.       . lenalidomide (REVLIMID) 25  MG capsule Take 1 capsule (25 mg total) by mouth daily. Take for 2 weeks then rest 7 days  14 capsule  0  . levothyroxine (SYNTHROID, LEVOTHROID) 75 MCG tablet Take 75 mcg by mouth daily before breakfast.      . lidocaine-prilocaine (EMLA) cream Apply 1 application topically as needed (port).      . morphine (MSIR) 15 MG tablet Take 15 mg by mouth every 4 (four) hours as needed for moderate pain or severe pain.      . Multiple Vitamins-Minerals (CENTRUM SILVER PO) Take 1 tablet by mouth daily.       . ondansetron (ZOFRAN) 8 MG tablet Take 8 mg by mouth every 8 (eight) hours as needed for nausea.      . ondansetron (ZOFRAN) 8 MG tablet Take 1 tablet (8 mg total) by mouth every 8 (eight) hours as needed.  30 tablet  1  . polyethylene glycol (MIRALAX / GLYCOLAX) packet Take 17 g by mouth daily.      . prochlorperazine (COMPAZINE) 10 MG tablet Take 1 tablet (10 mg total) by mouth every 6 (six) hours as needed (Nausea or vomiting).  30 tablet  1  . sulfamethoxazole-trimethoprim (BACTRIM DS,SEPTRA DS) 800-160 MG per tablet Take 1 tablet by mouth as directed. 1 tab twice a day on Mondays, Wednesday and Friday      . traMADol (ULTRAM) 50 MG tablet Take 50 mg by mouth every 6 (six) hours as  needed for moderate pain or severe pain.      Marland Kitchen venlafaxine XR (EFFEXOR-XR) 75 MG 24 hr capsule Take 1 capsule (75 mg total) by mouth 2 (two) times daily.  10 capsule  1  . verapamil (VERELAN PM) 120 MG 24 hr capsule Take 120 mg by mouth at bedtime.       No current facility-administered medications for this visit.    REVIEW OF SYSTEMS:   Constitutional: Denies fevers, chills or abnormal weight loss Eyes: Denies blurriness of vision Ears, nose, mouth, throat, and face: Denies mucositis or sore throat Respiratory: Denies cough, dyspnea or wheezes Cardiovascular: Denies palpitation, chest discomfort or lower extremity swelling Gastrointestinal:  Denies nausea, heartburn or change in bowel habits Skin: Denies abnormal  skin rashes Lymphatics: Denies new lymphadenopathy or easy bruising Neurological:Denies numbness, tingling or new weaknesses Behavioral/Psych: Mood is stable, no new changes  All other systems were reviewed with the patient and are negative.  PHYSICAL EXAMINATION: ECOG PERFORMANCE STATUS: 1 - Symptomatic but completely ambulatory  Filed Vitals:   12/06/13 1309  BP: 138/70  Pulse: 87  Temp: 98.4 F (36.9 C)  Resp: 18   Filed Weights   12/06/13 1309  Weight: 184 lb 12.8 oz (83.825 kg)    GENERAL:alert, no distress and comfortable SKIN: skin color, texture, turgor are normal, no rashes or significant lesions EYES: normal, Conjunctiva are pink and non-injected, sclera clear OROPHARYNX:no exudate, no erythema and lips, buccal mucosa, and tongue normal  NECK: supple, thyroid normal size, non-tender, without nodularity LYMPH:  no palpable lymphadenopathy in the cervical, axillary or inguinal LUNGS: clear to auscultation and percussion with normal breathing effort HEART: regular rate & rhythm and no murmurs and no lower extremity edema ABDOMEN:abdomen soft, non-tender and normal bowel sounds Musculoskeletal:no cyanosis of digits and no clubbing  NEURO: alert & oriented x 3 with fluent speech, no focal motor/sensory deficits  LABORATORY DATA:  I have reviewed the data as listed    Component Value Date/Time   NA 138 12/06/2013 1249   K 3.7 12/06/2013 1249   CO2 28 12/06/2013 1249   GLUCOSE 129 12/06/2013 1249   BUN 15.5 12/06/2013 1249   CREATININE 0.9 12/06/2013 1249   CALCIUM 9.4 12/06/2013 1249   PROT 9.5* 12/06/2013 1249   ALBUMIN 3.5 12/06/2013 1249   AST 21 12/06/2013 1249   ALT 28 12/06/2013 1249   ALKPHOS 73 12/06/2013 1249   BILITOT 0.26 12/06/2013 1249    No results found for this basename: SPEP,  UPEP,   kappa and lambda light chains    Lab Results  Component Value Date   WBC 3.7* 12/06/2013   NEUTROABS 1.3* 12/06/2013   HGB 9.6* 12/06/2013   HCT 30.2* 12/06/2013   MCV 73.3* 12/06/2013    PLT 231 12/06/2013      Chemistry      Component Value Date/Time   NA 138 12/06/2013 1249   K 3.7 12/06/2013 1249   CO2 28 12/06/2013 1249   BUN 15.5 12/06/2013 1249   CREATININE 0.9 12/06/2013 1249      Component Value Date/Time   CALCIUM 9.4 12/06/2013 1249   ALKPHOS 73 12/06/2013 1249   AST 21 12/06/2013 1249   ALT 28 12/06/2013 1249   BILITOT 0.26 12/06/2013 1249     ASSESSMENT & PLAN:  #1 multiple myeloma IgG lambda subtype, ISS stage 1 I discussed with her the natural history of multiple myeloma. We discussed about the role of induction chemotherapy followed by possible autologous stem  cell transplant After a lot of discussion, she was interested to participate in clinical study. She is enrolled for clinical trial [facility] [id number]. The patient was subsequently randomized to the control arm and she will be receiving Revlimid, bortezomib and dexamethasone.  After induction chemotherapy, the patient will proceed with possible autologous stem cell transplant, followed by possible maintenance therapy with Revlimid I also recommend supportive care with prophylactic antibiotic coverage with Bactrim as well as acyclovir We discussed the use of aspirin for DVT prophylaxis  I recommend vitamin D supplement as well as monthly Zometa to prevent skeletal events and improve bone health. I suggested the patient make an appointment to see his dentist for dental clearance before we begin intravenous Zometa. She had recent placement of Infuse-a-Port for venous access. I recommend to start her on allopurinol for possible tumor lysis prophylaxis.  We discussed the role of chemotherapy. The intent is for cure.  We discussed some of the risks, benefits, side-effects of Revlimid, Bortezemib and Dexamethasone.   Some of the short term side-effects included, though not limited to, risk of fatigue, risk of allergic reactions, mouth sores, weight loss, pancytopenia, life-threatening infections, need for transfusions of blood  products, nausea, vomiting, change in bowel habits, blood clots, admission to hospital for various reasons, and risks of death.   Long term side-effects are also discussed including risks of infertility, permanent damage to nerve function, chronic fatigue, and rare secondary malignancy including bone marrow disorders and leukemia.   The patient is aware that the response rates discussed earlier is not guaranteed.   The patient sign consent and we'll proceed with treatment today. I will see her in 3 weeks. #2 anemia This is likely anemia of her underlying disease. The patient denies recent history of bleeding such as epistaxis, hematuria or hematochezia. She is asymptomatic from the anemia. We will observe for now.  She does not require transfusion now.  #3 bone pain This is due to her underlying disease. She will continue pain medicine as needed   #4 preventive care I recommend she made an appointment to see her dentist for regular checkup and to take calcium with vitamin D supplement #5 disability We will extend her disability application until she completes her induction chemotherapy.  All questions were answered. The patient knows to call the clinic with any problems, questions or concerns. No barriers to learning was detected.    [place], [place], MD 12/06/2013 1:43 PM

## 2013-12-06 NOTE — Patient Instructions (Addendum)
[facility] Discharge Instructions for Patients Receiving Chemotherapy  Today you received the following chemotherapy agents :  Velcade, Zometa.  To help prevent nausea and vomiting after your treatment, we encourage you to take your nausea medication as instructed by your physician.  Take Zofran 8mg  by mouth every 8 hours as needed for nausea.   If you develop nausea and vomiting that is not controlled by your nausea medication, call the clinic.   BELOW ARE SYMPTOMS THAT SHOULD BE REPORTED IMMEDIATELY:  *FEVER GREATER THAN 100.5 F  *CHILLS WITH OR WITHOUT FEVER  NAUSEA AND VOMITING THAT IS NOT CONTROLLED WITH YOUR NAUSEA MEDICATION  *UNUSUAL SHORTNESS OF BREATH  *UNUSUAL BRUISING OR BLEEDING  TENDERNESS IN MOUTH AND THROAT WITH OR WITHOUT PRESENCE OF ULCERS  *URINARY PROBLEMS  *BOWEL PROBLEMS  UNUSUAL RASH Items with * indicate a potential emergency and should be followed up as soon as possible.  Feel free to call the clinic you have any questions or concerns. The clinic phone number is [phone].   Zoledronic Acid injection (Hypercalcemia, Oncology) What is this medicine? ZOLEDRONIC ACID (ZOE le dron ik AS id) lowers the amount of calcium loss from bone. It is used to treat too much calcium in your blood from cancer. It is also used to prevent complications of cancer that has spread to the bone. This medicine may be used for other purposes; ask your health care provider or pharmacist if you have questions. COMMON BRAND NAME(S): Zometa What should I tell my health care provider before I take this medicine? They need to know if you have any of these conditions: -aspirin-sensitive asthma -cancer, especially if you are receiving medicines used to treat cancer -dental disease or wear dentures -infection -kidney disease -receiving corticosteroids like dexamethasone or prednisone -an unusual or allergic reaction to zoledronic acid, other medicines, foods,  dyes, or preservatives -pregnant or trying to get pregnant -breast-feeding How should I use this medicine? This medicine is for infusion into a vein. It is given by a health care professional in a hospital or clinic setting. Talk to your pediatrician regarding the use of this medicine in children. Special care may be needed. Overdosage: If you think you have taken too much of this medicine contact a poison control center or emergency room at once. NOTE: This medicine is only for you. Do not share this medicine with others. What if I miss a dose? It is important not to miss your dose. Call your doctor or health care professional if you are unable to keep an appointment. What may interact with this medicine? -certain antibiotics given by injection -NSAIDs, medicines for pain and inflammation, like ibuprofen or naproxen -some diuretics like bumetanide, furosemide -teriparatide -thalidomide This list may not describe all possible interactions. Give your health care provider a list of all the medicines, herbs, non-prescription drugs, or dietary supplements you use. Also tell them if you smoke, drink alcohol, or use illegal drugs. Some items may interact with your medicine. What should I watch for while using this medicine? Visit your doctor or health care professional for regular checkups. It may be some time before you see the benefit from this medicine. Do not stop taking your medicine unless your doctor tells you to. Your doctor may order blood tests or other tests to see how you are doing. Women should inform their doctor if they wish to become pregnant or think they might be pregnant. There is a potential for serious side effects to an unborn  child. Talk to your health care professional or pharmacist for more information. You should make sure that you get enough calcium and vitamin D while you are taking this medicine. Discuss the foods you eat and the vitamins you take with your health care  professional. Some people who take this medicine have severe bone, joint, and/or muscle pain. This medicine may also increase your risk for jaw problems or a broken thigh bone. Tell your doctor right away if you have severe pain in your jaw, bones, joints, or muscles. Tell your doctor if you have any pain that does not go away or that gets worse. Tell your dentist and dental surgeon that you are taking this medicine. You should not have major dental surgery while on this medicine. See your dentist to have a dental exam and fix any dental problems before starting this medicine. Take good care of your teeth while on this medicine. Make sure you see your dentist for regular follow-up appointments. What side effects may I notice from receiving this medicine? Side effects that you should report to your doctor or health care professional as soon as possible: -allergic reactions like skin rash, itching or hives, swelling of the face, lips, or tongue -anxiety, confusion, or depression -breathing problems -changes in vision -eye pain -feeling faint or lightheaded, falls -jaw pain, especially after dental work -mouth sores -muscle cramps, stiffness, or weakness -trouble passing urine or change in the amount of urine Side effects that usually do not require medical attention (report to your doctor or health care professional if they continue or are bothersome): -bone, joint, or muscle pain -constipation -diarrhea -fever -hair loss -irritation at site where injected -loss of appetite -nausea, vomiting -stomach upset -trouble sleeping -trouble swallowing -weak or tired This list may not describe all possible side effects. Call your doctor for medical advice about side effects. You may report side effects to FDA at 1-800-FDA-1088. Where should I keep my medicine? This drug is given in a hospital or clinic and will not be stored at home. NOTE: This sheet is a summary. It may not cover all possible  information. If you have questions about this medicine, talk to your doctor, pharmacist, or health care provider.  2014, [employer]. (2013-04-28 13:03:13)

## 2013-12-06 NOTE — Telephone Encounter (Signed)
Gave pt appt for lab,md and chemo for January 20158, emailed Sharyn Lull regarding chemo for February 2015

## 2013-12-07 ENCOUNTER — Telehealth: Payer: Self-pay | Admitting: *Deleted

## 2013-12-07 NOTE — Telephone Encounter (Signed)
No adverse effect from chemo and has no questions.

## 2013-12-08 ENCOUNTER — Telehealth: Payer: Self-pay | Admitting: *Deleted

## 2013-12-08 ENCOUNTER — Telehealth: Payer: Self-pay | Admitting: Hematology and Oncology

## 2013-12-08 NOTE — Telephone Encounter (Signed)
Per staff message I have schedueld appts

## 2013-12-08 NOTE — Telephone Encounter (Signed)
Pt appt. With Dr. Nadara Mustard @ [facility] is 12/15/12@11 :00. Medical records faxed,slides and scans will be fedex'ed. Pt is aware

## 2013-12-08 NOTE — Telephone Encounter (Signed)
Called pt and left message regarding chemo for 12/09/13, advised to get calendar appt for january 2015

## 2013-12-09 ENCOUNTER — Encounter: Payer: 59 | Admitting: *Deleted

## 2013-12-09 ENCOUNTER — Ambulatory Visit (HOSPITAL_BASED_OUTPATIENT_CLINIC_OR_DEPARTMENT_OTHER): Payer: 59

## 2013-12-09 ENCOUNTER — Other Ambulatory Visit: Payer: 59

## 2013-12-09 VITALS — BP 109/61 | HR 85 | Temp 97.9°F

## 2013-12-09 DIAGNOSIS — C9000 Multiple myeloma not having achieved remission: Secondary | ICD-10-CM

## 2013-12-09 DIAGNOSIS — Z5112 Encounter for antineoplastic immunotherapy: Secondary | ICD-10-CM

## 2013-12-09 MED ORDER — INV-BORTEZOMIB CHEMO SQ INJECTION 3.5 MG CTSU E1A11
1.3000 mg/m2 | Freq: Once | INTRAVENOUS | Status: AC
  Administered 2013-12-09: 2.5 mg via SUBCUTANEOUS
  Filled 2013-12-09: qty 2.5

## 2013-12-09 MED ORDER — ONDANSETRON HCL 8 MG PO TABS
ORAL_TABLET | ORAL | Status: AC
  Filled 2013-12-09: qty 1

## 2013-12-09 MED ORDER — ONDANSETRON HCL 8 MG PO TABS
8.0000 mg | ORAL_TABLET | Freq: Once | ORAL | Status: AC
  Administered 2013-12-09: 8 mg via ORAL

## 2013-12-09 NOTE — Progress Notes (Signed)
12/09/2013 Patient in to clinic today for Day 4 treatment. Overall she feels well except for fatigue. States she wasn't feeling too well last night, but no other specific symptoms to report. Patient given a copy of her updated schedule. Patient reports that she believes her colonoscopy was performed within the past 10 years, as recommended per health maintenance. Procedure was performed by Dr. Cristina Gong. Will obtain records from [facility] for confirmation. [name] BSN, RN, [place] 12/09/2013 9:21 AM

## 2013-12-09 NOTE — Patient Instructions (Signed)
[facility] Discharge Instructions for Patients Receiving Chemotherapy  Today you received the following chemotherapy agents:  Velcade  To help prevent nausea and vomiting after your treatment, we encourage you to take your nausea medication as ordered per MD.   If you develop nausea and vomiting that is not controlled by your nausea medication, call the clinic.   BELOW ARE SYMPTOMS THAT SHOULD BE REPORTED IMMEDIATELY:  *FEVER GREATER THAN 100.5 F  *CHILLS WITH OR WITHOUT FEVER  NAUSEA AND VOMITING THAT IS NOT CONTROLLED WITH YOUR NAUSEA MEDICATION  *UNUSUAL SHORTNESS OF BREATH  *UNUSUAL BRUISING OR BLEEDING  TENDERNESS IN MOUTH AND THROAT WITH OR WITHOUT PRESENCE OF ULCERS  *URINARY PROBLEMS  *BOWEL PROBLEMS  UNUSUAL RASH Items with * indicate a potential emergency and should be followed up as soon as possible.  Feel free to call the clinic you have any questions or concerns. The clinic phone number is [phone].    

## 2013-12-13 ENCOUNTER — Telehealth: Payer: Self-pay | Admitting: Hematology and Oncology

## 2013-12-13 ENCOUNTER — Other Ambulatory Visit: Payer: Self-pay | Admitting: Hematology and Oncology

## 2013-12-13 ENCOUNTER — Other Ambulatory Visit: Payer: Self-pay | Admitting: *Deleted

## 2013-12-13 ENCOUNTER — Encounter: Payer: 59 | Admitting: *Deleted

## 2013-12-13 ENCOUNTER — Encounter: Payer: Self-pay | Admitting: Hematology and Oncology

## 2013-12-13 ENCOUNTER — Other Ambulatory Visit (HOSPITAL_BASED_OUTPATIENT_CLINIC_OR_DEPARTMENT_OTHER): Payer: 59

## 2013-12-13 ENCOUNTER — Ambulatory Visit: Payer: 59

## 2013-12-13 DIAGNOSIS — C9000 Multiple myeloma not having achieved remission: Secondary | ICD-10-CM

## 2013-12-13 LAB — CBC WITH DIFFERENTIAL/PLATELET
BASO%: 0.4 % (ref 0.0–2.0)
Basophils Absolute: 0 10*3/uL (ref 0.0–0.1)
EOS%: 0 % (ref 0.0–7.0)
Eosinophils Absolute: 0 10*3/uL (ref 0.0–0.5)
HCT: 31.2 % — ABNORMAL LOW (ref 34.8–46.6)
HGB: 10.1 g/dL — ABNORMAL LOW (ref 11.6–15.9)
LYMPH%: 11.3 % — ABNORMAL LOW (ref 14.0–49.7)
MCH: 23.4 pg — ABNORMAL LOW (ref 25.1–34.0)
MCHC: 32.3 g/dL (ref 31.5–36.0)
MCV: 72.2 fL — ABNORMAL LOW (ref 79.5–101.0)
MONO#: 0.5 10*3/uL (ref 0.1–0.9)
MONO%: 6.6 % (ref 0.0–14.0)
NEUT#: 6.1 10*3/uL (ref 1.5–6.5)
NEUT%: 81.7 % — ABNORMAL HIGH (ref 38.4–76.8)
Platelets: 19 10*3/uL — ABNORMAL LOW (ref 145–400)
RBC: 4.31 10*6/uL (ref 3.70–5.45)
RDW: 14.8 % — ABNORMAL HIGH (ref 11.2–14.5)
WBC: 7.5 10*3/uL (ref 3.9–10.3)
lymph#: 0.8 10*3/uL — ABNORMAL LOW (ref 0.9–3.3)

## 2013-12-13 MED ORDER — LENALIDOMIDE 15 MG PO CAPS: 15.0000 mg | ORAL_CAPSULE | Freq: Every day | ORAL | Status: DC

## 2013-12-13 NOTE — Progress Notes (Signed)
Faxed clinical information to [facility] @ [phone], in MD's note she states she is extending patient's disability throughout chemotherapy.

## 2013-12-13 NOTE — Telephone Encounter (Signed)
gv adn printed appt sched and avs for pt for Jan and Feb... °

## 2013-12-13 NOTE — Progress Notes (Signed)
12/13/2013 Patient in to clinic today for Day 8 assessment and treatment. Due to platelet count of 19,000 (grade 4) today, treatment with lenalidomide and bortezomib will be held, per protocol section 5.4.5. Note that patient had already taken today's dose of lenalidomide, as she is dosing oral medications at breakfast, per MD recommendation.  Patient is aware that she should take no further doses of lenalidomide until instructed to do so. In addition, patient will receive dose reduction of lenalidomide to 91m daily days 1-14, to be resumed on Friday 1/16 only if platelet count has recovered. Patient is aware that she will be receiving a new prescription of lenalidomide via mail at the reduced dose. She was told that she will not be taking the higher dose of lenalidomide in the future.  Per Dr. GAlvy Bimler patient will continue dosing of dexamethasone per protocol. Patient is aware that she should continue to take doses of dexamethasone tomorrow (Day 9) and Friday and Saturday (Days 11 and 12).   Patient's medication calendar was marked to HOLD lenalidomide doses for the next three days, until instructed otherwise. Patient is aware that when bortezomib is resumed, it will also be given at a lower dose.  Infusion staff and pharmacy notified of treatment hold today and dose modifications.   Per protocol, patient will return to clinic on Day 11 for CBC, and treatment will resume only if parameters are met at that time. In addition, MD has also requested additional lab appointment on Tuesday 1/20. Patient to scheduler for additional appointments to be scheduled.  Cindy S. SBrigitte PulseBSN, RN, CCRP 12/13/2013 1:29 PM

## 2013-12-14 ENCOUNTER — Other Ambulatory Visit: Payer: Self-pay | Admitting: Hematology and Oncology

## 2013-12-16 ENCOUNTER — Telehealth: Payer: Self-pay | Admitting: *Deleted

## 2013-12-16 ENCOUNTER — Other Ambulatory Visit: Payer: Self-pay | Admitting: *Deleted

## 2013-12-16 ENCOUNTER — Encounter: Payer: 59 | Admitting: *Deleted

## 2013-12-16 ENCOUNTER — Other Ambulatory Visit (HOSPITAL_BASED_OUTPATIENT_CLINIC_OR_DEPARTMENT_OTHER): Payer: 59

## 2013-12-16 ENCOUNTER — Other Ambulatory Visit: Payer: 59

## 2013-12-16 ENCOUNTER — Other Ambulatory Visit: Payer: Self-pay | Admitting: Hematology and Oncology

## 2013-12-16 ENCOUNTER — Ambulatory Visit (HOSPITAL_BASED_OUTPATIENT_CLINIC_OR_DEPARTMENT_OTHER): Payer: 59

## 2013-12-16 VITALS — BP 130/59 | HR 92 | Temp 97.8°F | Resp 17

## 2013-12-16 DIAGNOSIS — Z5112 Encounter for antineoplastic immunotherapy: Secondary | ICD-10-CM

## 2013-12-16 DIAGNOSIS — C9000 Multiple myeloma not having achieved remission: Secondary | ICD-10-CM

## 2013-12-16 LAB — CBC WITH DIFFERENTIAL/PLATELET
BASO%: 0 % (ref 0.0–2.0)
Basophils Absolute: 0 10*3/uL (ref 0.0–0.1)
EOS%: 0 % (ref 0.0–7.0)
Eosinophils Absolute: 0 10*3/uL (ref 0.0–0.5)
HCT: 29.4 % — ABNORMAL LOW (ref 34.8–46.6)
HGB: 9.4 g/dL — ABNORMAL LOW (ref 11.6–15.9)
LYMPH%: 41.2 % (ref 14.0–49.7)
MCH: 23.2 pg — ABNORMAL LOW (ref 25.1–34.0)
MCHC: 32 g/dL (ref 31.5–36.0)
MCV: 72.4 fL — ABNORMAL LOW (ref 79.5–101.0)
MONO#: 0.7 10*3/uL (ref 0.1–0.9)
MONO%: 11.3 % (ref 0.0–14.0)
NEUT#: 3 10*3/uL (ref 1.5–6.5)
NEUT%: 47.5 % (ref 38.4–76.8)
Platelets: 94 10*3/uL — ABNORMAL LOW (ref 145–400)
RBC: 4.06 10*6/uL (ref 3.70–5.45)
RDW: 15.2 % — ABNORMAL HIGH (ref 11.2–14.5)
WBC: 6.4 10*3/uL (ref 3.9–10.3)
lymph#: 2.6 10*3/uL (ref 0.9–3.3)
nRBC: 0 % (ref 0–0)

## 2013-12-16 LAB — COMPREHENSIVE METABOLIC PANEL (CC13)
ALT: 29 U/L (ref 0–55)
AST: 11 U/L (ref 5–34)
Albumin: 2.9 g/dL — ABNORMAL LOW (ref 3.5–5.0)
Alkaline Phosphatase: 71 U/L (ref 40–150)
Anion Gap: 8 mEq/L (ref 3–11)
BUN: 13.2 mg/dL (ref 7.0–26.0)
CO2: 27 mEq/L (ref 22–29)
Calcium: 9 mg/dL (ref 8.4–10.4)
Chloride: 101 mEq/L (ref 98–109)
Creatinine: 0.8 mg/dL (ref 0.6–1.1)
Glucose: 144 mg/dl — ABNORMAL HIGH (ref 70–140)
Potassium: 3.5 mEq/L (ref 3.5–5.1)
Sodium: 135 mEq/L — ABNORMAL LOW (ref 136–145)
Total Bilirubin: 0.45 mg/dL (ref 0.20–1.20)
Total Protein: 7.3 g/dL (ref 6.4–8.3)

## 2013-12-16 LAB — URIC ACID (CC13): Uric Acid, Serum: 5 mg/dl (ref 2.6–7.4)

## 2013-12-16 LAB — LACTATE DEHYDROGENASE (CC13): LDH: 97 U/L — ABNORMAL LOW (ref 125–245)

## 2013-12-16 LAB — TECHNOLOGIST REVIEW

## 2013-12-16 MED ORDER — INV-BORTEZOMIB CHEMO SQ INJECTION 3.5 MG CTSU E1A11
1.0000 mg/m2 | Freq: Once | INTRAVENOUS | Status: AC
  Administered 2013-12-16: 2 mg via SUBCUTANEOUS
  Filled 2013-12-16: qty 2

## 2013-12-16 MED ORDER — ONDANSETRON HCL 8 MG PO TABS
ORAL_TABLET | ORAL | Status: AC
  Filled 2013-12-16: qty 1

## 2013-12-16 MED ORDER — ONDANSETRON HCL 8 MG PO TABS
8.0000 mg | ORAL_TABLET | Freq: Once | ORAL | Status: AC
  Administered 2013-12-16: 8 mg via ORAL

## 2013-12-16 NOTE — Telephone Encounter (Signed)
Faxed copy of pathology report from bone marrow biopsy to [facility]

## 2013-12-16 NOTE — Patient Instructions (Signed)
[facility] Discharge Instructions for Patients Receiving Chemotherapy  Today you received the following chemotherapy agents: Velcade  To help prevent nausea and vomiting after your treatment, we encourage you to take your nausea medication: Compazine 10 mg every 6 hrs as needed.    If you develop nausea and vomiting that is not controlled by your nausea medication, call the clinic.   BELOW ARE SYMPTOMS THAT SHOULD BE REPORTED IMMEDIATELY:  *FEVER GREATER THAN 100.5 F  *CHILLS WITH OR WITHOUT FEVER  NAUSEA AND VOMITING THAT IS NOT CONTROLLED WITH YOUR NAUSEA MEDICATION  *UNUSUAL SHORTNESS OF BREATH  *UNUSUAL BRUISING OR BLEEDING  TENDERNESS IN MOUTH AND THROAT WITH OR WITHOUT PRESENCE OF ULCERS  *URINARY PROBLEMS  *BOWEL PROBLEMS  UNUSUAL RASH Items with * indicate a potential emergency and should be followed up as soon as possible.  Feel free to call the clinic you have any questions or concerns. The clinic phone number is [phone].    

## 2013-12-16 NOTE — Progress Notes (Signed)
Okay to tx per Dr. Alvy Bimler and Jenny Reichmann research RN with platelets-94

## 2013-12-16 NOTE — Telephone Encounter (Signed)
Confirmed with patient to take remaining allopurinol pills then stop.

## 2013-12-16 NOTE — Progress Notes (Signed)
12/16/2013 Patient in to clinic today for evaluation prior to Day 11 treatment. Based on platelet count of 94,000 (grade 1 thrombocytopenia), treatment may be resumed at dose level -1. Bortezomib dosing adjusted accordingly and approved by Dr. Alvy Bimler. Patient states she has not yet received new lenalidomide dose. Notified Sharlynn Oliphant RN for follow-up on delivery of lenalidomide 15mg  dose. Patient aware that she should not resume lenalidomide at 25mg  dose. She should begin lenalidomide dose 15mg  daily today, or when medication is received, and continue until Monday, January 12th. Patient did not take dexamethasone dose on Wednesday 1/14. Reinforced that she should continue dexamethasone as scheduled. Also, she should take dexamethasone doses today and tomorrow. Patient verbalized understanding of above. [name] BSN, RN, CCRP 12/16/2013 9:39 AM

## 2013-12-16 NOTE — Telephone Encounter (Signed)
Called [facility] to follow up on shipment of Revlimid. [facility] will ship new prescription with dose reduction 15mg . They thought she would complete the current cycle of 25 mg. They will ship new dose ASAP

## 2013-12-19 ENCOUNTER — Telehealth: Payer: Self-pay | Admitting: *Deleted

## 2013-12-19 NOTE — Telephone Encounter (Signed)
Pt states has Revlimid 25 mg and thought the "highest dose" and the dose she was on is only 20 mg.   Informed pt that 25 mg is the original dose she was prescribed and that new dose has been decreased to 15 mg.  Pt verbalized understanding and states has not received new dose from [facility] yet.   S/w Tyrell Antonio, RN research and she will f/u w/ pt regarding Revlimid.  Rx was faxed to [facility] for 15 mg on 12/13/13.

## 2013-12-19 NOTE — Telephone Encounter (Signed)
Spoke with patient by phone today after receiving voice mail message from patient over the weekend. She has not received her medication from [facility] at the reduced dose of 15mg . After further discussion with Dr. Calton Dach nurse, Terril, and additional phone call from patient, [facility] is shipping a prescription for 15mg  capsules x 14 for the subsequent cycle, and it is expected to arrive on Tuesday or Wednesday. Patient is aware that she will not take any additional doses of Revlimid for this cycle, since today is last scheduled dose for Cycle 1 and she does not have the correct dose on hand. Requested that patient notify me when drug arrives, to confirm it is the correct dose and correct number of doses. Patient verbalized understanding. [name] BSN, RN, CCRP 12/19/2013 9:30 AM

## 2013-12-20 ENCOUNTER — Other Ambulatory Visit (HOSPITAL_BASED_OUTPATIENT_CLINIC_OR_DEPARTMENT_OTHER): Payer: 59

## 2013-12-20 ENCOUNTER — Encounter: Payer: Self-pay | Admitting: Hematology and Oncology

## 2013-12-20 DIAGNOSIS — C9000 Multiple myeloma not having achieved remission: Secondary | ICD-10-CM

## 2013-12-20 LAB — CBC WITH DIFFERENTIAL/PLATELET
BASO%: 0.4 % (ref 0.0–2.0)
Basophils Absolute: 0 10*3/uL (ref 0.0–0.1)
EOS%: 0 % (ref 0.0–7.0)
Eosinophils Absolute: 0 10*3/uL (ref 0.0–0.5)
HCT: 29.1 % — ABNORMAL LOW (ref 34.8–46.6)
HGB: 9.2 g/dL — ABNORMAL LOW (ref 11.6–15.9)
LYMPH%: 40.6 % (ref 14.0–49.7)
MCH: 23.5 pg — ABNORMAL LOW (ref 25.1–34.0)
MCHC: 31.5 g/dL (ref 31.5–36.0)
MCV: 74.7 fL — ABNORMAL LOW (ref 79.5–101.0)
MONO#: 1.2 10*3/uL — ABNORMAL HIGH (ref 0.1–0.9)
MONO%: 16.1 % — ABNORMAL HIGH (ref 0.0–14.0)
NEUT#: 3.3 10*3/uL (ref 1.5–6.5)
NEUT%: 42.9 % (ref 38.4–76.8)
Platelets: 256 10*3/uL (ref 145–400)
RBC: 3.9 10*6/uL (ref 3.70–5.45)
RDW: 14.8 % — ABNORMAL HIGH (ref 11.2–14.5)
WBC: 7.7 10*3/uL (ref 3.9–10.3)
lymph#: 3.1 10*3/uL (ref 0.9–3.3)

## 2013-12-20 NOTE — Telephone Encounter (Signed)
RECEIVED A FAX FROM [facility] CONCERNING A CONFIRMATION OF PRESCRIPTION SHIPMENT FOR REVLIMID ON 12/19/13.

## 2013-12-27 ENCOUNTER — Ambulatory Visit (HOSPITAL_BASED_OUTPATIENT_CLINIC_OR_DEPARTMENT_OTHER): Payer: 59 | Admitting: Hematology and Oncology

## 2013-12-27 ENCOUNTER — Telehealth: Payer: Self-pay | Admitting: Hematology and Oncology

## 2013-12-27 ENCOUNTER — Other Ambulatory Visit: Payer: Self-pay | Admitting: *Deleted

## 2013-12-27 ENCOUNTER — Ambulatory Visit (HOSPITAL_BASED_OUTPATIENT_CLINIC_OR_DEPARTMENT_OTHER): Payer: 59

## 2013-12-27 ENCOUNTER — Other Ambulatory Visit (HOSPITAL_BASED_OUTPATIENT_CLINIC_OR_DEPARTMENT_OTHER): Payer: 59

## 2013-12-27 ENCOUNTER — Encounter: Payer: Self-pay | Admitting: *Deleted

## 2013-12-27 ENCOUNTER — Encounter: Payer: Self-pay | Admitting: Hematology and Oncology

## 2013-12-27 VITALS — BP 122/64 | HR 85 | Temp 98.3°F | Resp 20 | Ht 67.0 in | Wt 184.0 lb

## 2013-12-27 VITALS — BP 127/80 | HR 73 | Temp 98.3°F

## 2013-12-27 DIAGNOSIS — C9000 Multiple myeloma not having achieved remission: Secondary | ICD-10-CM

## 2013-12-27 DIAGNOSIS — D72819 Decreased white blood cell count, unspecified: Secondary | ICD-10-CM

## 2013-12-27 DIAGNOSIS — D649 Anemia, unspecified: Secondary | ICD-10-CM

## 2013-12-27 DIAGNOSIS — T451X5A Adverse effect of antineoplastic and immunosuppressive drugs, initial encounter: Secondary | ICD-10-CM

## 2013-12-27 DIAGNOSIS — Z5112 Encounter for antineoplastic immunotherapy: Secondary | ICD-10-CM

## 2013-12-27 DIAGNOSIS — D701 Agranulocytosis secondary to cancer chemotherapy: Secondary | ICD-10-CM

## 2013-12-27 HISTORY — DX: Agranulocytosis secondary to cancer chemotherapy: T45.1X5A

## 2013-12-27 HISTORY — DX: Agranulocytosis secondary to cancer chemotherapy: D70.1

## 2013-12-27 LAB — COMPREHENSIVE METABOLIC PANEL (CC13)
ALT: 28 U/L (ref 0–55)
AST: 16 U/L (ref 5–34)
Albumin: 3 g/dL — ABNORMAL LOW (ref 3.5–5.0)
Alkaline Phosphatase: 85 U/L (ref 40–150)
Anion Gap: 8 mEq/L (ref 3–11)
BUN: 10.8 mg/dL (ref 7.0–26.0)
CO2: 24 mEq/L (ref 22–29)
Calcium: 9.1 mg/dL (ref 8.4–10.4)
Chloride: 106 mEq/L (ref 98–109)
Creatinine: 0.9 mg/dL (ref 0.6–1.1)
Glucose: 142 mg/dl — ABNORMAL HIGH (ref 70–140)
Potassium: 3.6 mEq/L (ref 3.5–5.1)
Sodium: 138 mEq/L (ref 136–145)
Total Bilirubin: 0.32 mg/dL (ref 0.20–1.20)
Total Protein: 7 g/dL (ref 6.4–8.3)

## 2013-12-27 LAB — CBC WITH DIFFERENTIAL/PLATELET
BASO%: 1.1 % (ref 0.0–2.0)
Basophils Absolute: 0 10*3/uL (ref 0.0–0.1)
EOS%: 0.1 % (ref 0.0–7.0)
Eosinophils Absolute: 0 10*3/uL (ref 0.0–0.5)
HCT: 28.1 % — ABNORMAL LOW (ref 34.8–46.6)
HGB: 9 g/dL — ABNORMAL LOW (ref 11.6–15.9)
LYMPH%: 44.8 % (ref 14.0–49.7)
MCH: 23.8 pg — ABNORMAL LOW (ref 25.1–34.0)
MCHC: 31.9 g/dL (ref 31.5–36.0)
MCV: 74.6 fL — ABNORMAL LOW (ref 79.5–101.0)
MONO#: 0.4 10*3/uL (ref 0.1–0.9)
MONO%: 11.7 % (ref 0.0–14.0)
NEUT#: 1.5 10*3/uL (ref 1.5–6.5)
NEUT%: 42.3 % (ref 38.4–76.8)
Platelets: 253 10*3/uL (ref 145–400)
RBC: 3.77 10*6/uL (ref 3.70–5.45)
RDW: 16 % — ABNORMAL HIGH (ref 11.2–14.5)
WBC: 3.4 10*3/uL — ABNORMAL LOW (ref 3.9–10.3)
lymph#: 1.5 10*3/uL (ref 0.9–3.3)

## 2013-12-27 LAB — LACTATE DEHYDROGENASE (CC13): LDH: 168 U/L (ref 125–245)

## 2013-12-27 LAB — URIC ACID (CC13): Uric Acid, Serum: 6.7 mg/dl (ref 2.6–7.4)

## 2013-12-27 MED ORDER — ONDANSETRON HCL 8 MG PO TABS
ORAL_TABLET | ORAL | Status: AC
  Filled 2013-12-27: qty 1

## 2013-12-27 MED ORDER — ONDANSETRON HCL 8 MG PO TABS
8.0000 mg | ORAL_TABLET | Freq: Once | ORAL | Status: AC
  Administered 2013-12-27: 8 mg via ORAL

## 2013-12-27 MED ORDER — INV-BORTEZOMIB CHEMO SQ INJECTION 3.5 MG CTSU E1A11
1.0000 mg/m2 | Freq: Once | INTRAVENOUS | Status: AC
  Administered 2013-12-27: 2 mg via SUBCUTANEOUS
  Filled 2013-12-27: qty 2

## 2013-12-27 NOTE — Patient Instructions (Signed)
[facility] Discharge Instructions for Patients Receiving Chemotherapy  Today you received the following chemotherapy agents:  Velcade  To help prevent nausea and vomiting after your treatment, we encourage you to take your nausea medication as ordered per MD.   If you develop nausea and vomiting that is not controlled by your nausea medication, call the clinic.   BELOW ARE SYMPTOMS THAT SHOULD BE REPORTED IMMEDIATELY:  *FEVER GREATER THAN 100.5 F  *CHILLS WITH OR WITHOUT FEVER  NAUSEA AND VOMITING THAT IS NOT CONTROLLED WITH YOUR NAUSEA MEDICATION  *UNUSUAL SHORTNESS OF BREATH  *UNUSUAL BRUISING OR BLEEDING  TENDERNESS IN MOUTH AND THROAT WITH OR WITHOUT PRESENCE OF ULCERS  *URINARY PROBLEMS  *BOWEL PROBLEMS  UNUSUAL RASH Items with * indicate a potential emergency and should be followed up as soon as possible.  Feel free to call the clinic you have any questions or concerns. The clinic phone number is [phone].    

## 2013-12-27 NOTE — Progress Notes (Signed)
12/27/13 E1A11 Cycle 2, day 1 Mrs. Rocque is in the cancer center today for lab work, physical exam, and treatment on the E1A11 study. She returned her completed diary sheet from cycle 2. She has lenolidomide, 15 mg. Tablets with her and will begin taking them today. She also has her decadron to begin today. She was given a new diary sheet.  She independently completed the PRO questionnaires for the study.  Review of the symptom checklist shows she experienced nausea one day . It was controled with an antiemetic and she did not experience any vomiting. She experienced a little dyspnea with exertion, it did not prevent her from performing her usual activity and is not present today. She had 2 nights where she had difficulty sleeping. Did not take medication and is not present now. She does have peripheral neuropathy in her hands and feet, not causing difficulty with her usual activities.   Sign for infusion given to V. Jacquelynn Cree, RN.

## 2013-12-27 NOTE — Progress Notes (Signed)
[facility] OFFICE PROGRESS NOTE  Patient Care Team: Wenda Low, MD as PCP - General (Internal Medicine) Heath Lark, MD as Consulting Physician (Hematology and Oncology)  DIAGNOSIS: IgG lambda multiple myeloma  SUMMARY OF ONCOLOGIC HISTORY: Oncology History   ISS stage 1 IgG lambda subtype (serum albumin 3.6, Beta2 microglobulin 2.32) Durie Salmon Stage 1     Multiple myeloma, without mention of having achieved remission   10/10/2013 Imaging Skeletal survery was negative   11/09/2013 Bone Marrow Biopsy BM biopsy confirmed myeloma, 76% involved, IgG lambda subtype   12/06/2013 -  Chemotherapy Start cycle 1 of chemo with revlimid, Velcade, Dexamethasone and Zometa. Patient particpated in clinical research [facility] [phone]    INTERVAL HISTORY: Carla Perry 58 y.o. female returns for further followup. She denies bony achiness. She has some fatigue after chemotherapy but this is improving. She is able to perform all activities of daily living. With the most recent low counts, she denies bleeding or bruising. She went to [facility] for bone marrow transplant evaluation. After she returned, she made an informed decision not to proceed with bone marrow transplant  I have reviewed the past medical history, past surgical history, social history and family history with the patient and they are unchanged from previous note.  ALLERGIES:  has No Known Allergies.  MEDICATIONS:  Current Outpatient Prescriptions  Medication Sig Dispense Refill  . acyclovir (ZOVIRAX) 400 MG tablet Take 400 mg by mouth 2 (two) times daily.      Marland Kitchen aspirin 81 MG tablet Take 81 mg by mouth daily.      . Botulinum Toxin Type A 200 UNITS SOLR every 3 (three) months. Dispense 200 units. To be injected by physician during office visit      . Cholecalciferol (VITAMIN D3) 2000 UNITS TABS Take 2,000 Units by mouth daily.      Marland Kitchen dexamethasone (DECADRON) 4 MG tablet Take 4 mg by mouth as  directed. Take 10 tablets every week      . hydrochlorothiazide (HYDRODIURIL) 25 MG tablet Take 25 mg by mouth daily with breakfast.       . lenalidomide (REVLIMID) 15 MG capsule Take 1 capsule (15 mg total) by mouth daily.  14 capsule  0  . levothyroxine (SYNTHROID, LEVOTHROID) 75 MCG tablet Take 75 mcg by mouth daily before breakfast.      . lidocaine-prilocaine (EMLA) cream Apply 1 application topically as needed (port).      . morphine (MSIR) 15 MG tablet Take 15 mg by mouth every 4 (four) hours as needed for moderate pain or severe pain.      . Multiple Vitamins-Minerals (CENTRUM SILVER PO) Take 1 tablet by mouth daily.       . ondansetron (ZOFRAN) 8 MG tablet Take 8 mg by mouth every 8 (eight) hours as needed for nausea.      . ondansetron (ZOFRAN) 8 MG tablet Take 1 tablet (8 mg total) by mouth every 8 (eight) hours as needed.  30 tablet  1  . polyethylene glycol (MIRALAX / GLYCOLAX) packet Take 17 g by mouth daily.      . prochlorperazine (COMPAZINE) 10 MG tablet Take 1 tablet (10 mg total) by mouth every 6 (six) hours as needed (Nausea or vomiting).  30 tablet  1  . sulfamethoxazole-trimethoprim (BACTRIM DS,SEPTRA DS) 800-160 MG per tablet Take 1 tablet by mouth as directed. 1 tab twice a day on Mondays, Wednesday and Friday      .  traMADol (ULTRAM) 50 MG tablet Take 50 mg by mouth every 6 (six) hours as needed for moderate pain or severe pain.      Marland Kitchen venlafaxine XR (EFFEXOR-XR) 75 MG 24 hr capsule Take 1 capsule (75 mg total) by mouth 2 (two) times daily.  10 capsule  1  . verapamil (VERELAN PM) 120 MG 24 hr capsule Take 120 mg by mouth at bedtime.       No current facility-administered medications for this visit.    REVIEW OF SYSTEMS:   Constitutional: Denies fevers, chills or abnormal weight loss Eyes: Denies blurriness of vision Ears, nose, mouth, throat, and face: Denies mucositis or sore throat Respiratory: Denies cough, dyspnea or wheezes Cardiovascular: Denies palpitation,  chest discomfort or lower extremity swelling Gastrointestinal:  Denies nausea, heartburn or change in bowel habits Skin: Denies abnormal skin rashes Lymphatics: Denies new lymphadenopathy or easy bruising Neurological:Denies numbness, tingling or new weaknesses Behavioral/Psych: Mood is stable, no new changes  All other systems were reviewed with the patient and are negative.  PHYSICAL EXAMINATION: ECOG PERFORMANCE STATUS: 1 - Symptomatic but completely ambulatory  Filed Vitals:   12/27/13 0930  BP: 122/64  Pulse: 85  Temp: 98.3 F (36.8 C)  Resp: 20   Filed Weights   12/27/13 0930  Weight: 184 lb (83.462 kg)    GENERAL:alert, no distress and comfortable SKIN: skin color, texture, turgor are normal, no rashes or significant lesions EYES: normal, Conjunctiva are pink and non-injected, sclera clear OROPHARYNX:no exudate, no erythema and lips, buccal mucosa, and tongue normal  NECK: supple, thyroid normal size, non-tender, without nodularity LYMPH:  no palpable lymphadenopathy in the cervical, axillary or inguinal LUNGS: clear to auscultation and percussion with normal breathing effort HEART: regular rate & rhythm and no murmurs and no lower extremity edema ABDOMEN:abdomen soft, non-tender and normal bowel sounds Musculoskeletal:no cyanosis of digits and no clubbing  NEURO: alert & oriented x 3 with fluent speech, no focal motor/sensory deficits  LABORATORY DATA:  I have reviewed the data as listed    Component Value Date/Time   NA 138 12/27/2013 0812   K 3.6 12/27/2013 0812   CO2 24 12/27/2013 0812   GLUCOSE 142* 12/27/2013 0812   BUN 10.8 12/27/2013 0812   CREATININE 0.9 12/27/2013 0812   CALCIUM 9.1 12/27/2013 0812   PROT 7.0 12/27/2013 0812   ALBUMIN 3.0* 12/27/2013 0812   AST 16 12/27/2013 0812   ALT 28 12/27/2013 0812   ALKPHOS 85 12/27/2013 0812   BILITOT 0.32 12/27/2013 0812    No results found for this basename: SPEP,  UPEP,   kappa and lambda light chains    Lab  Results  Component Value Date   WBC 3.4* 12/27/2013   NEUTROABS 1.5 12/27/2013   HGB 9.0* 12/27/2013   HCT 28.1* 12/27/2013   MCV 74.6* 12/27/2013   PLT 253 12/27/2013      Chemistry      Component Value Date/Time   NA 138 12/27/2013 0812   K 3.6 12/27/2013 0812   CO2 24 12/27/2013 0812   BUN 10.8 12/27/2013 0812   CREATININE 0.9 12/27/2013 0812      Component Value Date/Time   CALCIUM 9.1 12/27/2013 0812   ALKPHOS 85 12/27/2013 0812   AST 16 12/27/2013 0812   ALT 28 12/27/2013 0812   BILITOT 0.32 12/27/2013 0812       ASSESSMENT & PLAN:  #1 multiple myeloma IgG lambda subtype, ISS stage 1 We discussed about the role of induction chemotherapy  followed by possible autologous stem cell transplant but the patient has made an informed decision not to proceed with bone marrow transplant after induction chemotherapy. She tolerated chemotherapy well apart from low blood counts. The dosage Revlimid and bortezomib were reduced per protocol. #2 antimicrobial prophylaxis I also recommend supportive care with prophylactic antibiotic coverage with Bactrim as well as acyclovir #3 DVT prophylaxis We discussed the use of aspirin for DVT prophylaxis  #4 bone health and prevention against risk of fractures I recommend vitamin D supplement as well as monthly Zometa to prevent skeletal events and improve bone health. #5 tumor lysis prophylaxis She has completed allopurinol and I will discontinue #6 anemia This is likely anemia of her underlying disease. The patient denies recent history of bleeding such as epistaxis, hematuria or hematochezia. She is asymptomatic from the anemia. We will observe for now.  She does not require transfusion now.  #7 leukopenia This is likely due to recent treatment. The patient denies recent history of fevers, cough, chills, diarrhea or dysuria. She is asymptomatic from the leukopenia. I will observe for now.  I will continue the chemotherapy at current dose without dosage  adjustment.  If the leukopenia gets progressive worse in the future, I might have to delay her treatment or adjust the chemotherapy dose. #8 disability We will extend her disability application until she completes her induction chemotherapy.  All questions were answered. The patient knows to call the clinic with any problems, questions or concerns. No barriers to learning was detected.   [facility], [place], MD 12/27/2013 7:05 PM

## 2013-12-27 NOTE — Telephone Encounter (Signed)
gv and printed appt sched and avs for pt for Jan adn Feb...sed added tx.   °

## 2013-12-30 ENCOUNTER — Encounter: Payer: 59 | Admitting: *Deleted

## 2013-12-30 ENCOUNTER — Ambulatory Visit (HOSPITAL_BASED_OUTPATIENT_CLINIC_OR_DEPARTMENT_OTHER): Payer: 59

## 2013-12-30 VITALS — BP 127/70 | HR 82 | Temp 98.2°F | Resp 18

## 2013-12-30 DIAGNOSIS — C9000 Multiple myeloma not having achieved remission: Secondary | ICD-10-CM

## 2013-12-30 DIAGNOSIS — Z5112 Encounter for antineoplastic immunotherapy: Secondary | ICD-10-CM

## 2013-12-30 LAB — KAPPA/LAMBDA LIGHT CHAINS
Kappa free light chain: 0.03 mg/dL — ABNORMAL LOW (ref 0.33–1.94)
Kappa:Lambda Ratio: 0 — ABNORMAL LOW (ref 0.26–1.65)
Lambda Free Lght Chn: 14.3 mg/dL — ABNORMAL HIGH (ref 0.57–2.63)

## 2013-12-30 LAB — SPEP & IFE WITH QIG
Albumin ELP: 52.6 % — ABNORMAL LOW (ref 55.8–66.1)
Alpha-1-Globulin: 4 % (ref 2.9–4.9)
Alpha-2-Globulin: 9.1 % (ref 7.1–11.8)
Beta 2: 4.3 % (ref 3.2–6.5)
Beta Globulin: 5.8 % (ref 4.7–7.2)
Gamma Globulin: 24.2 % — ABNORMAL HIGH (ref 11.1–18.8)
IgA: 21 mg/dL — ABNORMAL LOW (ref 69–380)
IgG (Immunoglobin G), Serum: 2010 mg/dL — ABNORMAL HIGH (ref 690–1700)
IgM, Serum: 34 mg/dL — ABNORMAL LOW (ref 52–322)
M-Spike, %: 1.34 g/dL
Total Protein, Serum Electrophoresis: 6.6 g/dL (ref 6.0–8.3)

## 2013-12-30 MED ORDER — ONDANSETRON HCL 8 MG PO TABS
ORAL_TABLET | ORAL | Status: AC
  Filled 2013-12-30: qty 1

## 2013-12-30 MED ORDER — INV-BORTEZOMIB CHEMO SQ INJECTION 3.5 MG CTSU E1A11
1.0000 mg/m2 | Freq: Once | INTRAVENOUS | Status: AC
  Administered 2013-12-30: 2 mg via SUBCUTANEOUS
  Filled 2013-12-30: qty 2

## 2013-12-30 MED ORDER — ONDANSETRON HCL 8 MG PO TABS
8.0000 mg | ORAL_TABLET | Freq: Once | ORAL | Status: AC
  Administered 2013-12-30: 8 mg via ORAL

## 2013-12-30 NOTE — Patient Instructions (Signed)
[facility] Discharge Instructions for Patients Receiving Chemotherapy  Today you received the following chemotherapy agent: Velcade   To help prevent nausea and vomiting after your treatment, we encourage you to take your nausea medication as prescribed.    If you develop nausea and vomiting that is not controlled by your nausea medication, call the clinic.   BELOW ARE SYMPTOMS THAT SHOULD BE REPORTED IMMEDIATELY:  *FEVER GREATER THAN 100.5 F  *CHILLS WITH OR WITHOUT FEVER  NAUSEA AND VOMITING THAT IS NOT CONTROLLED WITH YOUR NAUSEA MEDICATION  *UNUSUAL SHORTNESS OF BREATH  *UNUSUAL BRUISING OR BLEEDING  TENDERNESS IN MOUTH AND THROAT WITH OR WITHOUT PRESENCE OF ULCERS  *URINARY PROBLEMS  *BOWEL PROBLEMS  UNUSUAL RASH Items with * indicate a potential emergency and should be followed up as soon as possible.  Feel free to call the clinic you have any questions or concerns. The clinic phone number is [phone].    

## 2013-12-30 NOTE — Progress Notes (Signed)
Treatment parameters verified with Tyrell Antonio, RN prior to patient's arrival. OK to treat per Research RN.

## 2014-01-02 ENCOUNTER — Other Ambulatory Visit: Payer: Self-pay | Admitting: *Deleted

## 2014-01-02 DIAGNOSIS — C9000 Multiple myeloma not having achieved remission: Secondary | ICD-10-CM

## 2014-01-02 MED ORDER — LENALIDOMIDE 15 MG PO CAPS: 15.0000 mg | ORAL_CAPSULE | Freq: Every day | ORAL | Status: DC

## 2014-01-02 NOTE — Telephone Encounter (Signed)
THIS REFILL REQUEST FOR REVLIMID WAS PLACED IN DR.GORSUCH'S ACTIVE WORK FOLDER. 

## 2014-01-03 ENCOUNTER — Other Ambulatory Visit (HOSPITAL_BASED_OUTPATIENT_CLINIC_OR_DEPARTMENT_OTHER): Payer: 59

## 2014-01-03 ENCOUNTER — Ambulatory Visit (HOSPITAL_BASED_OUTPATIENT_CLINIC_OR_DEPARTMENT_OTHER): Payer: 59

## 2014-01-03 ENCOUNTER — Telehealth: Payer: Self-pay | Admitting: Hematology and Oncology

## 2014-01-03 ENCOUNTER — Encounter: Payer: 59 | Admitting: *Deleted

## 2014-01-03 VITALS — BP 123/69 | HR 91 | Temp 97.8°F | Resp 17

## 2014-01-03 DIAGNOSIS — C9000 Multiple myeloma not having achieved remission: Secondary | ICD-10-CM

## 2014-01-03 DIAGNOSIS — Z5112 Encounter for antineoplastic immunotherapy: Secondary | ICD-10-CM

## 2014-01-03 LAB — CBC WITH DIFFERENTIAL/PLATELET
BASO%: 0.1 % (ref 0.0–2.0)
Basophils Absolute: 0 10*3/uL (ref 0.0–0.1)
EOS%: 0 % (ref 0.0–7.0)
Eosinophils Absolute: 0 10*3/uL (ref 0.0–0.5)
HCT: 28.1 % — ABNORMAL LOW (ref 34.8–46.6)
HGB: 8.9 g/dL — ABNORMAL LOW (ref 11.6–15.9)
LYMPH%: 13 % — ABNORMAL LOW (ref 14.0–49.7)
MCH: 23.7 pg — ABNORMAL LOW (ref 25.1–34.0)
MCHC: 31.9 g/dL (ref 31.5–36.0)
MCV: 74.2 fL — ABNORMAL LOW (ref 79.5–101.0)
MONO#: 0.7 10*3/uL (ref 0.1–0.9)
MONO%: 10.9 % (ref 0.0–14.0)
NEUT#: 4.7 10*3/uL (ref 1.5–6.5)
NEUT%: 76 % (ref 38.4–76.8)
Platelets: 72 10*3/uL — ABNORMAL LOW (ref 145–400)
RBC: 3.78 10*6/uL (ref 3.70–5.45)
RDW: 17.3 % — ABNORMAL HIGH (ref 11.2–14.5)
WBC: 6.2 10*3/uL (ref 3.9–10.3)
lymph#: 0.8 10*3/uL — ABNORMAL LOW (ref 0.9–3.3)

## 2014-01-03 MED ORDER — ONDANSETRON HCL 8 MG PO TABS
8.0000 mg | ORAL_TABLET | Freq: Once | ORAL | Status: AC
  Administered 2014-01-03: 8 mg via ORAL

## 2014-01-03 MED ORDER — INV-BORTEZOMIB CHEMO SQ INJECTION 3.5 MG CTSU E1A11
1.0000 mg/m2 | Freq: Once | INTRAVENOUS | Status: AC
  Administered 2014-01-03: 2 mg via SUBCUTANEOUS
  Filled 2014-01-03: qty 2

## 2014-01-03 MED ORDER — ONDANSETRON HCL 8 MG PO TABS
ORAL_TABLET | ORAL | Status: AC
  Filled 2014-01-03: qty 1

## 2014-01-03 NOTE — Patient Instructions (Signed)
[facility] Discharge Instructions for Patients Receiving Chemotherapy  Today you received the following chemotherapy agent: Velcade   To help prevent nausea and vomiting after your treatment, we encourage you to take your nausea medication as prescribed.    If you develop nausea and vomiting that is not controlled by your nausea medication, call the clinic.   BELOW ARE SYMPTOMS THAT SHOULD BE REPORTED IMMEDIATELY:  *FEVER GREATER THAN 100.5 F  *CHILLS WITH OR WITHOUT FEVER  NAUSEA AND VOMITING THAT IS NOT CONTROLLED WITH YOUR NAUSEA MEDICATION  *UNUSUAL SHORTNESS OF BREATH  *UNUSUAL BRUISING OR BLEEDING  TENDERNESS IN MOUTH AND THROAT WITH OR WITHOUT PRESENCE OF ULCERS  *URINARY PROBLEMS  *BOWEL PROBLEMS  UNUSUAL RASH Items with * indicate a potential emergency and should be followed up as soon as possible.  Feel free to call the clinic you have any questions or concerns. The clinic phone number is [phone].    

## 2014-01-03 NOTE — Telephone Encounter (Signed)
s.w. pt and advised on Feb appts.Marland KitchenMarland Kitchenpt will pick up new sched at todays visit

## 2014-01-03 NOTE — Progress Notes (Signed)
01/03/2014 Patient in to clinic today for Day 8 treatment. She feels well overall, except for some fatigue. Per Dr. Alvy Bimler, and as confirmed by protocol section 5.4.4, okay to proceed with Day 8 treatment with platelet count of 72,000. Reviewed this plan with infusion nurses Jaci Carrel RN and Blanchard Kelch RN, as well as pharmacist, Kennith Center. Patient aware of lab results from today, as well as results from last week's disease assessment, indicating a partial response to treatment. [name] BSN, RN, [place] 01/03/2014 2:40 PM

## 2014-01-03 NOTE — Progress Notes (Signed)
01/03/2014 Per Dr. Alvy Bimler, and as confirmed by protocol section 5.4.4, okay to proceed with Day 8 treatment with platelet count of 72,000. [name] BSN, RN, [place] 01/03/2014 2:35 PM

## 2014-01-03 NOTE — Telephone Encounter (Signed)
RECEIVED A FAX FROM [employer] CONCERNING A CONFIRMATION OF FACSIMILE RECEIPT FOR PT. REFERRAL. 

## 2014-01-05 NOTE — Telephone Encounter (Signed)
RECEIVED A FAX FROM [employer] CONCERNING A CONFIRMATION OF PRESCRIPTION SHIPMENT FOR REVLIMID ON 01/04/14. 

## 2014-01-06 ENCOUNTER — Encounter: Payer: Self-pay | Admitting: *Deleted

## 2014-01-06 ENCOUNTER — Ambulatory Visit (HOSPITAL_BASED_OUTPATIENT_CLINIC_OR_DEPARTMENT_OTHER): Payer: 59

## 2014-01-06 ENCOUNTER — Other Ambulatory Visit: Payer: 59

## 2014-01-06 ENCOUNTER — Other Ambulatory Visit: Payer: Self-pay | Admitting: *Deleted

## 2014-01-06 VITALS — BP 114/60 | HR 85 | Temp 99.3°F

## 2014-01-06 DIAGNOSIS — C9000 Multiple myeloma not having achieved remission: Secondary | ICD-10-CM

## 2014-01-06 DIAGNOSIS — Z5112 Encounter for antineoplastic immunotherapy: Secondary | ICD-10-CM

## 2014-01-06 MED ORDER — ZOLEDRONIC ACID 4 MG/100ML IV SOLN
4.0000 mg | Freq: Once | INTRAVENOUS | Status: AC
  Administered 2014-01-06: 4 mg via INTRAVENOUS
  Filled 2014-01-06: qty 100

## 2014-01-06 MED ORDER — HEPARIN SOD (PORK) LOCK FLUSH 100 UNIT/ML IV SOLN
500.0000 [IU] | Freq: Once | INTRAVENOUS | Status: AC | PRN
  Administered 2014-01-06: 500 [IU]
  Filled 2014-01-06: qty 5

## 2014-01-06 MED ORDER — ONDANSETRON HCL 8 MG PO TABS
8.0000 mg | ORAL_TABLET | Freq: Once | ORAL | Status: AC
  Administered 2014-01-06: 8 mg via ORAL

## 2014-01-06 MED ORDER — INV-BORTEZOMIB CHEMO SQ INJECTION 3.5 MG CTSU E1A11
1.0000 mg/m2 | Freq: Once | INTRAVENOUS | Status: AC
  Administered 2014-01-06: 2 mg via SUBCUTANEOUS
  Filled 2014-01-06: qty 2

## 2014-01-06 MED ORDER — SODIUM CHLORIDE 0.9 % IJ SOLN
10.0000 mL | INTRAMUSCULAR | Status: DC | PRN
  Administered 2014-01-06: 10 mL
  Filled 2014-01-06: qty 10

## 2014-01-06 MED ORDER — ONDANSETRON HCL 8 MG PO TABS
ORAL_TABLET | ORAL | Status: AC
  Filled 2014-01-06: qty 1

## 2014-01-06 MED ORDER — SODIUM CHLORIDE 0.9 % IJ SOLN
3.0000 mL | Freq: Once | INTRAMUSCULAR | Status: DC | PRN
  Filled 2014-01-06: qty 10

## 2014-01-06 NOTE — Patient Instructions (Signed)
[place] Discharge Instructions for Patients Receiving Chemotherapy  Today you received the following chemotherapy agents Velcade.  To help prevent nausea and vomiting after your treatment, we encourage you to take your nausea medication as prescribed.   If you develop nausea and vomiting that is not controlled by your nausea medication, call the clinic.   BELOW ARE SYMPTOMS THAT SHOULD BE REPORTED IMMEDIATELY:  *FEVER GREATER THAN 100.5 F  *CHILLS WITH OR WITHOUT FEVER  NAUSEA AND VOMITING THAT IS NOT CONTROLLED WITH YOUR NAUSEA MEDICATION  *UNUSUAL SHORTNESS OF BREATH  *UNUSUAL BRUISING OR BLEEDING  TENDERNESS IN MOUTH AND THROAT WITH OR WITHOUT PRESENCE OF ULCERS  *URINARY PROBLEMS  *BOWEL PROBLEMS  UNUSUAL RASH Items with * indicate a potential emergency and should be followed up as soon as possible.  Feel free to call the clinic you have any questions or concerns. The clinic phone number is [phone].   Zoledronic Acid injection (Hypercalcemia, Oncology) What is this medicine? ZOLEDRONIC ACID (ZOE le dron ik AS id) lowers the amount of calcium loss from bone. It is used to treat too much calcium in your blood from cancer. It is also used to prevent complications of cancer that has spread to the bone. This medicine may be used for other purposes; ask your health care provider or pharmacist if you have questions. COMMON BRAND NAME(S): Zometa What should I tell my health care provider before I take this medicine? They need to know if you have any of these conditions: -aspirin-sensitive asthma -cancer, especially if you are receiving medicines used to treat cancer -dental disease or wear dentures -infection -kidney disease -receiving corticosteroids like dexamethasone or prednisone -an unusual or allergic reaction to zoledronic acid, other medicines, foods, dyes, or preservatives -pregnant or trying to get pregnant -breast-feeding How should I  use this medicine? This medicine is for infusion into a vein. It is given by a health care professional in a hospital or clinic setting. Talk to your pediatrician regarding the use of this medicine in children. Special care may be needed. Overdosage: If you think you have taken too much of this medicine contact a poison control center or emergency room at once. NOTE: This medicine is only for you. Do not share this medicine with others. What if I miss a dose? It is important not to miss your dose. Call your doctor or health care professional if you are unable to keep an appointment. What may interact with this medicine? -certain antibiotics given by injection -NSAIDs, medicines for pain and inflammation, like ibuprofen or naproxen -some diuretics like bumetanide, furosemide -teriparatide -thalidomide This list may not describe all possible interactions. Give your health care provider a list of all the medicines, herbs, non-prescription drugs, or dietary supplements you use. Also tell them if you smoke, drink alcohol, or use illegal drugs. Some items may interact with your medicine. What should I watch for while using this medicine? Visit your doctor or health care professional for regular checkups. It may be some time before you see the benefit from this medicine. Do not stop taking your medicine unless your doctor tells you to. Your doctor may order blood tests or other tests to see how you are doing. Women should inform their doctor if they wish to become pregnant or think they might be pregnant. There is a potential for serious side effects to an unborn child. Talk to your health care professional or pharmacist for more information. You should make sure that you get  get enough calcium and vitamin D while you are taking this medicine. Discuss the foods you eat and the vitamins you take with your health care professional. Some people who take this medicine have severe bone, joint, and/or muscle pain.  This medicine may also increase your risk for jaw problems or a broken thigh bone. Tell your doctor right away if you have severe pain in your jaw, bones, joints, or muscles. Tell your doctor if you have any pain that does not go away or that gets worse. Tell your dentist and dental surgeon that you are taking this medicine. You should not have major dental surgery while on this medicine. See your dentist to have a dental exam and fix any dental problems before starting this medicine. Take good care of your teeth while on this medicine. Make sure you see your dentist for regular follow-up appointments. What side effects may I notice from receiving this medicine? Side effects that you should report to your doctor or health care professional as soon as possible: -allergic reactions like skin rash, itching or hives, swelling of the face, lips, or tongue -anxiety, confusion, or depression -breathing problems -changes in vision -eye pain -feeling faint or lightheaded, falls -jaw pain, especially after dental work -mouth sores -muscle cramps, stiffness, or weakness -trouble passing urine or change in the amount of urine Side effects that usually do not require medical attention (report to your doctor or health care professional if they continue or are bothersome): -bone, joint, or muscle pain -constipation -diarrhea -fever -hair loss -irritation at site where injected -loss of appetite -nausea, vomiting -stomach upset -trouble sleeping -trouble swallowing -weak or tired This list may not describe all possible side effects. Call your doctor for medical advice about side effects. You may report side effects to FDA at 1-800-FDA-1088. Where should I keep my medicine? This drug is given in a hospital or clinic and will not be stored at home. NOTE: This sheet is a summary. It may not cover all possible information. If you have questions about this medicine, talk to your doctor, pharmacist, or  health care provider.  2014, [employer]. (2013-04-28 13:03:13)  

## 2014-01-06 NOTE — Progress Notes (Signed)
01/06/2014 Patient in to clinic today for Day 11 treatment. She feels okay, with ongoing fatigue. Patient medication diary was reviewed with the patient and she is taking home medication (study treatment) as prescribed.  Patient reports that she has received her refill of Revlimid for the next cycle. Asked patient to confirm that she has received the correct dose of medication. Patient was also instructed to obtain a refill of her dexamethasone tablets for the next cycle, to begin on 2/17. Sign for infusion given to [name] RN. [name] BSN, RN, [place] 01/06/2014 11:05 AM

## 2014-01-16 ENCOUNTER — Telehealth: Payer: Self-pay | Admitting: Hematology and Oncology

## 2014-01-16 ENCOUNTER — Encounter: Payer: Self-pay | Admitting: Hematology and Oncology

## 2014-01-16 ENCOUNTER — Ambulatory Visit (HOSPITAL_BASED_OUTPATIENT_CLINIC_OR_DEPARTMENT_OTHER): Payer: 59

## 2014-01-16 ENCOUNTER — Other Ambulatory Visit: Payer: Self-pay | Admitting: *Deleted

## 2014-01-16 DIAGNOSIS — C9000 Multiple myeloma not having achieved remission: Secondary | ICD-10-CM

## 2014-01-16 LAB — URIC ACID (CC13): Uric Acid, Serum: 5.6 mg/dl (ref 2.6–7.4)

## 2014-01-16 LAB — CBC WITH DIFFERENTIAL/PLATELET
BASO%: 0.4 % (ref 0.0–2.0)
Basophils Absolute: 0 10*3/uL (ref 0.0–0.1)
EOS%: 0.4 % (ref 0.0–7.0)
Eosinophils Absolute: 0 10*3/uL (ref 0.0–0.5)
HCT: 27.3 % — ABNORMAL LOW (ref 34.8–46.6)
HGB: 8.6 g/dL — ABNORMAL LOW (ref 11.6–15.9)
LYMPH%: 26.7 % (ref 14.0–49.7)
MCH: 23.9 pg — ABNORMAL LOW (ref 25.1–34.0)
MCHC: 31.5 g/dL (ref 31.5–36.0)
MCV: 75.8 fL — ABNORMAL LOW (ref 79.5–101.0)
MONO#: 0.7 10*3/uL (ref 0.1–0.9)
MONO%: 14.4 % — ABNORMAL HIGH (ref 0.0–14.0)
NEUT#: 2.7 10*3/uL (ref 1.5–6.5)
NEUT%: 58.1 % (ref 38.4–76.8)
Platelets: 286 10*3/uL (ref 145–400)
RBC: 3.59 10*6/uL — ABNORMAL LOW (ref 3.70–5.45)
RDW: 19.1 % — ABNORMAL HIGH (ref 11.2–14.5)
WBC: 4.6 10*3/uL (ref 3.9–10.3)
lymph#: 1.2 10*3/uL (ref 0.9–3.3)

## 2014-01-16 LAB — COMPREHENSIVE METABOLIC PANEL (CC13)
ALT: 29 U/L (ref 0–55)
AST: 19 U/L (ref 5–34)
Albumin: 3.1 g/dL — ABNORMAL LOW (ref 3.5–5.0)
Alkaline Phosphatase: 82 U/L (ref 40–150)
Anion Gap: 6 mEq/L (ref 3–11)
BUN: 9.3 mg/dL (ref 7.0–26.0)
CO2: 27 mEq/L (ref 22–29)
Calcium: 9 mg/dL (ref 8.4–10.4)
Chloride: 105 mEq/L (ref 98–109)
Creatinine: 0.8 mg/dL (ref 0.6–1.1)
Glucose: 122 mg/dl (ref 70–140)
Potassium: 3.7 mEq/L (ref 3.5–5.1)
Sodium: 138 mEq/L (ref 136–145)
Total Bilirubin: 0.29 mg/dL (ref 0.20–1.20)
Total Protein: 6.2 g/dL — ABNORMAL LOW (ref 6.4–8.3)

## 2014-01-16 LAB — LACTATE DEHYDROGENASE (CC13): LDH: 199 U/L (ref 125–245)

## 2014-01-16 NOTE — Telephone Encounter (Signed)
s/w pt re appt for lb today @ 10:45am. lb for tomorrow cx'd. pt will only have f/u - tx tomorrow if lb ok today per research nurse

## 2014-01-17 ENCOUNTER — Other Ambulatory Visit: Payer: 59

## 2014-01-17 ENCOUNTER — Encounter: Payer: 59 | Admitting: *Deleted

## 2014-01-17 ENCOUNTER — Ambulatory Visit (HOSPITAL_BASED_OUTPATIENT_CLINIC_OR_DEPARTMENT_OTHER): Payer: 59

## 2014-01-17 ENCOUNTER — Telehealth: Payer: Self-pay | Admitting: *Deleted

## 2014-01-17 ENCOUNTER — Ambulatory Visit (HOSPITAL_BASED_OUTPATIENT_CLINIC_OR_DEPARTMENT_OTHER): Payer: 59 | Admitting: Hematology and Oncology

## 2014-01-17 VITALS — BP 119/72 | HR 90 | Temp 97.9°F | Resp 18 | Ht 67.0 in | Wt 188.4 lb

## 2014-01-17 DIAGNOSIS — C9000 Multiple myeloma not having achieved remission: Secondary | ICD-10-CM

## 2014-01-17 DIAGNOSIS — Z5112 Encounter for antineoplastic immunotherapy: Secondary | ICD-10-CM

## 2014-01-17 DIAGNOSIS — M898X9 Other specified disorders of bone, unspecified site: Secondary | ICD-10-CM

## 2014-01-17 DIAGNOSIS — H5789 Other specified disorders of eye and adnexa: Secondary | ICD-10-CM

## 2014-01-17 DIAGNOSIS — D649 Anemia, unspecified: Secondary | ICD-10-CM

## 2014-01-17 MED ORDER — INV-BORTEZOMIB CHEMO SQ INJECTION 3.5 MG CTSU E1A11
1.0000 mg/m2 | Freq: Once | INTRAVENOUS | Status: AC
  Administered 2014-01-17: 2 mg via SUBCUTANEOUS
  Filled 2014-01-17: qty 2

## 2014-01-17 MED ORDER — ONDANSETRON HCL 8 MG PO TABS
8.0000 mg | ORAL_TABLET | Freq: Once | ORAL | Status: AC
  Administered 2014-01-17: 8 mg via ORAL

## 2014-01-17 MED ORDER — ONDANSETRON HCL 8 MG PO TABS
ORAL_TABLET | ORAL | Status: AC
  Filled 2014-01-17: qty 1

## 2014-01-17 MED ORDER — TRAMADOL HCL 50 MG PO TABS: 50.0000 mg | ORAL_TABLET | Freq: Four times a day (QID) | ORAL | Status: DC | PRN

## 2014-01-17 NOTE — Progress Notes (Signed)
[place] OFFICE PROGRESS NOTE  Patient Care Team: Wenda Low, MD as PCP - General (Internal Medicine) Heath Lark, MD as Consulting Physician (Hematology and Oncology)  DIAGNOSIS: Multiple myeloma, IgG lambda subtype  SUMMARY OF ONCOLOGIC HISTORY: Oncology History   ISS stage 1 IgG lambda subtype (serum albumin 3.6, Beta2 microglobulin 2.32) Durie Salmon Stage 1     Multiple myeloma, without mention of having achieved remission   10/10/2013 Imaging Skeletal survery was negative   11/09/2013 Bone Marrow Biopsy BM biopsy confirmed myeloma, 76% involved, IgG lambda subtype   12/06/2013 -  Chemotherapy Start cycle 1 of chemo with revlimid, Velcade, Dexamethasone and Zometa. Patient particpated in clinical research [facility] [id number]    INTERVAL HISTORY: Carla Perry 58 y.o. female returns for further followup. Her main complaint is right eye sensitivity to light. She say that her eye hurt. It lasted only about one day around the third week of each cycle. She denies any blurriness of vision or double vision. She denies any recent fever, chills, night sweats or abnormal weight loss. In fact she has significant weight gain of 4 pounds over the last 1 month. The patient denies any recent signs or symptoms of bleeding such as spontaneous epistaxis, hematuria or hematochezia. Denies new bone pain. She continues to see tramadol as needed for pain. I have reviewed the past medical history, past surgical history, social history and family history with the patient and they are unchanged from previous note.  ALLERGIES:  has No Known Allergies.  MEDICATIONS:  Current Outpatient Prescriptions  Medication Sig Dispense Refill  . acyclovir (ZOVIRAX) 400 MG tablet Take 400 mg by mouth 2 (two) times daily.      Marland Kitchen aspirin 81 MG tablet Take 81 mg by mouth daily.      . Cholecalciferol (VITAMIN D3) 2000 UNITS TABS Take 2,000 Units by mouth daily.      Marland Kitchen dexamethasone (DECADRON) 4 MG tablet  Take 4 mg by mouth as directed. Take 10 tablets every week      . hydrochlorothiazide (HYDRODIURIL) 25 MG tablet Take 25 mg by mouth daily with breakfast.       . lenalidomide (REVLIMID) 15 MG capsule Take 1 capsule (15 mg total) by mouth daily.  14 capsule  0  . levothyroxine (SYNTHROID, LEVOTHROID) 75 MCG tablet Take 75 mcg by mouth daily before breakfast.      . lidocaine-prilocaine (EMLA) cream Apply 1 application topically as needed (port).      . Multiple Vitamins-Minerals (CENTRUM SILVER PO) Take 1 tablet by mouth daily.       . ondansetron (ZOFRAN) 8 MG tablet Take 8 mg by mouth every 8 (eight) hours as needed for nausea.      . ondansetron (ZOFRAN) 8 MG tablet Take 1 tablet (8 mg total) by mouth every 8 (eight) hours as needed.  30 tablet  1  . polyethylene glycol (MIRALAX / GLYCOLAX) packet Take 17 g by mouth daily.      . prochlorperazine (COMPAZINE) 10 MG tablet Take 1 tablet (10 mg total) by mouth every 6 (six) hours as needed (Nausea or vomiting).  30 tablet  1  . sulfamethoxazole-trimethoprim (BACTRIM DS,SEPTRA DS) 800-160 MG per tablet Take 1 tablet by mouth as directed. 1 tab twice a day on Mondays, Wednesday and Friday      . traMADol (ULTRAM) 50 MG tablet Take 1 tablet (50 mg total) by mouth every 6 (six) hours as needed for moderate pain or severe pain.  60 tablet  0  . venlafaxine XR (EFFEXOR-XR) 75 MG 24 hr capsule Take 1 capsule (75 mg total) by mouth 2 (two) times daily.  10 capsule  1  . verapamil (VERELAN PM) 120 MG 24 hr capsule Take 120 mg by mouth at bedtime.      . Botulinum Toxin Type A 200 UNITS SOLR every 3 (three) months. Dispense 200 units. To be injected by physician during office visit      . morphine (MSIR) 15 MG tablet Take 15 mg by mouth every 4 (four) hours as needed for moderate pain or severe pain.       No current facility-administered medications for this visit.    REVIEW OF SYSTEMS:   Constitutional: Denies fevers, chills or abnormal weight  loss Eyes: Denies blurriness of vision Ears, nose, mouth, throat, and face: Denies mucositis or sore throat Respiratory: Denies cough, dyspnea or wheezes Cardiovascular: Denies palpitation, chest discomfort or lower extremity swelling Gastrointestinal:  Denies nausea, heartburn or change in bowel habits Skin: Denies abnormal skin rashes Lymphatics: Denies new lymphadenopathy or easy bruising Neurological: She complained of some numbness around her abdomen but no tingling or weakness Behavioral/Psych: Mood is stable, no new changes  All other systems were reviewed with the patient and are negative.  PHYSICAL EXAMINATION: ECOG PERFORMANCE STATUS: 1 - Symptomatic but completely ambulatory  Filed Vitals:   01/17/14 0818  BP: 119/72  Pulse: 90  Temp: 97.9 F (36.6 C)  Resp: 18   Filed Weights   01/17/14 0818  Weight: 188 lb 6.4 oz (85.458 kg)    GENERAL:alert, no distress and comfortable SKIN: skin color, texture, turgor are normal, no rashes or significant lesions EYES: normal, Conjunctiva are pink and non-injected, sclera clear OROPHARYNX:no exudate, no erythema and lips, buccal mucosa, and tongue normal  NECK: supple, thyroid normal size, non-tender, without nodularity LYMPH:  no palpable lymphadenopathy in the cervical, axillary or inguinal LUNGS: clear to auscultation and percussion with normal breathing effort HEART: regular rate & rhythm and no murmurs and no lower extremity edema ABDOMEN:abdomen soft, non-tender and normal bowel sounds Musculoskeletal:no cyanosis of digits and no clubbing  NEURO: alert & oriented x 3 with fluent speech, no focal motor/sensory deficits  LABORATORY DATA:  I have reviewed the data as listed    Component Value Date/Time   NA 138 01/16/2014 1055   K 3.7 01/16/2014 1055   CO2 27 01/16/2014 1055   GLUCOSE 122 01/16/2014 1055   BUN 9.3 01/16/2014 1055   CREATININE 0.8 01/16/2014 1055   CALCIUM 9.0 01/16/2014 1055   PROT 6.2* 01/16/2014 1055    ALBUMIN 3.1* 01/16/2014 1055   AST 19 01/16/2014 1055   ALT 29 01/16/2014 1055   ALKPHOS 82 01/16/2014 1055   BILITOT 0.29 01/16/2014 1055    No results found for this basename: SPEP,  UPEP,   kappa and lambda light chains    Lab Results  Component Value Date   WBC 4.6 01/16/2014   NEUTROABS 2.7 01/16/2014   HGB 8.6* 01/16/2014   HCT 27.3* 01/16/2014   MCV 75.8* 01/16/2014   PLT 286 01/16/2014      Chemistry      Component Value Date/Time   NA 138 01/16/2014 1055   K 3.7 01/16/2014 1055   CO2 27 01/16/2014 1055   BUN 9.3 01/16/2014 1055   CREATININE 0.8 01/16/2014 1055      Component Value Date/Time   CALCIUM 9.0 01/16/2014 1055   ALKPHOS 82 01/16/2014 1055  AST 19 01/16/2014 1055   ALT 29 01/16/2014 1055   BILITOT 0.29 01/16/2014 1055      ASSESSMENT & PLAN:  #1 multiple myeloma IgG lambda subtype, ISS stage 1 We discussed about the role of induction chemotherapy followed by possible autologous stem cell transplant but the patient has made an informed decision not to proceed with bone marrow transplant after induction chemotherapy. She tolerated chemotherapy well apart from low blood counts. The dosage Revlimid and bortezomib were reduced per protocol. Most recent blood work suggests that she have 50% or more response rate. She is encouraged #2 antimicrobial prophylaxis I also recommend supportive care with prophylactic antibiotic coverage with Bactrim as well as acyclovir #3 DVT prophylaxis We discussed the use of aspirin for DVT prophylaxis  #4 bone health and prevention against risk of fractures I recommend vitamin D supplement as well as monthly Zometa to prevent skeletal events and improve bone health. Her next Zometa will be delayed a little bit until the next cycle to coincide with her next treatment #5 right eye sensitivity It does not bother her now. If they recur, I recommend ophthalmologist evaluation and she agreed #6 anemia This is likely anemia of her underlying  disease. The patient denies recent history of bleeding such as epistaxis, hematuria or hematochezia. She is asymptomatic from the anemia. We will observe for now.  She does not require transfusion now.  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 We will observe only.  All questions were answered. The patient knows to call the clinic with any problems, questions or concerns. No barriers to learning was detected. I spent 25 minutes counseling the patient face to face. The total time spent in the appointment was 40 minutes and more than 50% was on counseling and review of test results     [facility], [place], MD 01/17/2014 8:57 AM

## 2014-01-17 NOTE — Telephone Encounter (Signed)
Per staff message and POF I have scheduled appts.  JMW  

## 2014-01-17 NOTE — Patient Instructions (Signed)
[facility] Discharge Instructions for Patients Receiving Chemotherapy  Today you received the following chemotherapy agents: Velcade. To help prevent nausea and vomiting after your treatment, we encourage you to take your nausea medication.  If you develop nausea and vomiting that is not controlled by your nausea medication, call the clinic.   BELOW ARE SYMPTOMS THAT SHOULD BE REPORTED IMMEDIATELY:  *FEVER GREATER THAN 100.5 F  *CHILLS WITH OR WITHOUT FEVER  NAUSEA AND VOMITING THAT IS NOT CONTROLLED WITH YOUR NAUSEA MEDICATION  *UNUSUAL SHORTNESS OF BREATH  *UNUSUAL BRUISING OR BLEEDING  TENDERNESS IN MOUTH AND THROAT WITH OR WITHOUT PRESENCE OF ULCERS  *URINARY PROBLEMS  *BOWEL PROBLEMS  UNUSUAL RASH Items with * indicate a potential emergency and should be followed up as soon as possible.  Feel free to call the clinic you have any questions or concerns. The clinic phone number is [phone].    

## 2014-01-17 NOTE — Progress Notes (Signed)
01/17/2014 Patient in to clinic today to initiate treatment cycle 3. Labwork obtained on 01/16/2014 due to inclement weather and scheduling concerns. Patient reports isolated episodes of sensitivity to light in her right eye, occuring for one day during the 3rd week of treatment. This is resolved after taking Ultram. Per MD, will continue to monitor for this occurrence in subsequent cycle. Patient is aware that a bone marrow procedure will be scheduled to occur at the end of the fourth cycle of treatment. Based on lab results review and history and physical by Dr. Alvy Bimler, patient condition is acceptable for continued treatment today. Patient returned completed cycle 2 Patient Medication Calendar, confirming correct dosing as prescribed. Blank cycle 3 Patient Medication Calendar was given to patient today for completion. Sign for infusion given to Jaci Carrel RN. [name] BSN, RN, CCRP 01/17/2014 8:54 AM

## 2014-01-18 LAB — SPEP & IFE WITH QIG
Albumin ELP: 56.6 % (ref 55.8–66.1)
Alpha-1-Globulin: 5.4 % — ABNORMAL HIGH (ref 2.9–4.9)
Alpha-2-Globulin: 11 % (ref 7.1–11.8)
Beta 2: 5.1 % (ref 3.2–6.5)
Beta Globulin: 6.3 % (ref 4.7–7.2)
Gamma Globulin: 15.6 % (ref 11.1–18.8)
IgA: 13 mg/dL — ABNORMAL LOW (ref 69–380)
IgG (Immunoglobin G), Serum: 974 mg/dL (ref 690–1700)
IgM, Serum: 29 mg/dL — ABNORMAL LOW (ref 52–322)
M-Spike, %: 0.64 g/dL
Total Protein, Serum Electrophoresis: 6.1 g/dL (ref 6.0–8.3)

## 2014-01-18 LAB — KAPPA/LAMBDA LIGHT CHAINS
Kappa free light chain: 0.81 mg/dL (ref 0.33–1.94)
Kappa:Lambda Ratio: 0.19 — ABNORMAL LOW (ref 0.26–1.65)
Lambda Free Lght Chn: 4.32 mg/dL — ABNORMAL HIGH (ref 0.57–2.63)

## 2014-01-20 ENCOUNTER — Ambulatory Visit (HOSPITAL_BASED_OUTPATIENT_CLINIC_OR_DEPARTMENT_OTHER): Payer: 59

## 2014-01-20 ENCOUNTER — Encounter: Payer: Self-pay | Admitting: *Deleted

## 2014-01-20 VITALS — BP 131/67 | HR 96 | Temp 98.0°F | Resp 18

## 2014-01-20 DIAGNOSIS — C9000 Multiple myeloma not having achieved remission: Secondary | ICD-10-CM

## 2014-01-20 DIAGNOSIS — Z5112 Encounter for antineoplastic immunotherapy: Secondary | ICD-10-CM

## 2014-01-20 MED ORDER — ONDANSETRON HCL 8 MG PO TABS
8.0000 mg | ORAL_TABLET | Freq: Once | ORAL | Status: AC
  Administered 2014-01-20: 8 mg via ORAL

## 2014-01-20 MED ORDER — ONDANSETRON HCL 8 MG PO TABS
ORAL_TABLET | ORAL | Status: AC
  Filled 2014-01-20: qty 1

## 2014-01-20 MED ORDER — INV-BORTEZOMIB CHEMO SQ INJECTION 3.5 MG CTSU E1A11
1.0000 mg/m2 | Freq: Once | INTRAVENOUS | Status: AC
  Administered 2014-01-20: 2 mg via SUBCUTANEOUS
  Filled 2014-01-20: qty 2

## 2014-01-20 NOTE — Progress Notes (Signed)
01/20/2014 Patient in to clinic today for Day 4 treatment. She states that she feels much better this cycle. Informed patient of continued improvement in her lab values, indicating continued partial response. Sign for infusion given to [place]. [name] BSN, RN, [place] 01/20/2014 11:29 AM

## 2014-01-24 ENCOUNTER — Ambulatory Visit (HOSPITAL_BASED_OUTPATIENT_CLINIC_OR_DEPARTMENT_OTHER): Payer: 59

## 2014-01-24 ENCOUNTER — Encounter: Payer: Self-pay | Admitting: *Deleted

## 2014-01-24 ENCOUNTER — Other Ambulatory Visit (HOSPITAL_BASED_OUTPATIENT_CLINIC_OR_DEPARTMENT_OTHER): Payer: 59

## 2014-01-24 DIAGNOSIS — C9000 Multiple myeloma not having achieved remission: Secondary | ICD-10-CM

## 2014-01-24 DIAGNOSIS — Z5112 Encounter for antineoplastic immunotherapy: Secondary | ICD-10-CM

## 2014-01-24 LAB — CBC WITH DIFFERENTIAL/PLATELET
BASO%: 0.2 % (ref 0.0–2.0)
Basophils Absolute: 0 10*3/uL (ref 0.0–0.1)
EOS%: 2.6 % (ref 0.0–7.0)
Eosinophils Absolute: 0.1 10*3/uL (ref 0.0–0.5)
HCT: 30.7 % — ABNORMAL LOW (ref 34.8–46.6)
HGB: 9.6 g/dL — ABNORMAL LOW (ref 11.6–15.9)
LYMPH%: 30.6 % (ref 14.0–49.7)
MCH: 23.4 pg — ABNORMAL LOW (ref 25.1–34.0)
MCHC: 31.3 g/dL — ABNORMAL LOW (ref 31.5–36.0)
MCV: 74.7 fL — ABNORMAL LOW (ref 79.5–101.0)
MONO#: 0.3 10*3/uL (ref 0.1–0.9)
MONO%: 6.8 % (ref 0.0–14.0)
NEUT#: 2.7 10*3/uL (ref 1.5–6.5)
NEUT%: 59.8 % (ref 38.4–76.8)
Platelets: 153 10*3/uL (ref 145–400)
RBC: 4.11 10*6/uL (ref 3.70–5.45)
RDW: 18.8 % — ABNORMAL HIGH (ref 11.2–14.5)
WBC: 4.6 10*3/uL (ref 3.9–10.3)
lymph#: 1.4 10*3/uL (ref 0.9–3.3)
nRBC: 0 % (ref 0–0)

## 2014-01-24 MED ORDER — ONDANSETRON HCL 8 MG PO TABS
8.0000 mg | ORAL_TABLET | Freq: Once | ORAL | Status: AC
  Administered 2014-01-24: 8 mg via ORAL

## 2014-01-24 MED ORDER — ONDANSETRON HCL 8 MG PO TABS
ORAL_TABLET | ORAL | Status: AC
  Filled 2014-01-24: qty 1

## 2014-01-24 MED ORDER — INV-BORTEZOMIB CHEMO SQ INJECTION 3.5 MG CTSU E1A11
1.0000 mg/m2 | Freq: Once | INTRAVENOUS | Status: AC
  Administered 2014-01-24: 2 mg via SUBCUTANEOUS
  Filled 2014-01-24: qty 2

## 2014-01-24 NOTE — Progress Notes (Signed)
Mrs. Hinojosa is in the cancer center today for treatment on E1411. Her CBC is WNL for treatment. She confirmed she is taking the decaron and lenalidomide as prescribed. She had no AEs to report.  Sign for infusion given to V. Nicola Girt, RN

## 2014-01-27 ENCOUNTER — Ambulatory Visit (HOSPITAL_BASED_OUTPATIENT_CLINIC_OR_DEPARTMENT_OTHER): Payer: 59

## 2014-01-27 VITALS — BP 116/66 | HR 82 | Temp 97.8°F | Resp 18

## 2014-01-27 DIAGNOSIS — C9000 Multiple myeloma not having achieved remission: Secondary | ICD-10-CM

## 2014-01-27 DIAGNOSIS — Z5112 Encounter for antineoplastic immunotherapy: Secondary | ICD-10-CM

## 2014-01-27 MED ORDER — ONDANSETRON HCL 8 MG PO TABS
ORAL_TABLET | ORAL | Status: AC
  Filled 2014-01-27: qty 1

## 2014-01-27 MED ORDER — ONDANSETRON HCL 8 MG PO TABS
8.0000 mg | ORAL_TABLET | Freq: Once | ORAL | Status: AC
  Administered 2014-01-27: 8 mg via ORAL

## 2014-01-27 MED ORDER — INV-BORTEZOMIB CHEMO SQ INJECTION 3.5 MG CTSU E1A11
1.0000 mg/m2 | Freq: Once | INTRAVENOUS | Status: AC
  Administered 2014-01-27: 2 mg via SUBCUTANEOUS
  Filled 2014-01-27: qty 2

## 2014-01-27 NOTE — Patient Instructions (Signed)
[facility] Discharge Instructions for Patients Receiving Chemotherapy  Today you received the following chemotherapy agents: Velcade. To help prevent nausea and vomiting after your treatment, we encourage you to take your nausea medication.  If you develop nausea and vomiting that is not controlled by your nausea medication, call the clinic.   BELOW ARE SYMPTOMS THAT SHOULD BE REPORTED IMMEDIATELY:  *FEVER GREATER THAN 100.5 F  *CHILLS WITH OR WITHOUT FEVER  NAUSEA AND VOMITING THAT IS NOT CONTROLLED WITH YOUR NAUSEA MEDICATION  *UNUSUAL SHORTNESS OF BREATH  *UNUSUAL BRUISING OR BLEEDING  TENDERNESS IN MOUTH AND THROAT WITH OR WITHOUT PRESENCE OF ULCERS  *URINARY PROBLEMS  *BOWEL PROBLEMS  UNUSUAL RASH Items with * indicate a potential emergency and should be followed up as soon as possible.  Feel free to call the clinic you have any questions or concerns. The clinic phone number is [phone].    

## 2014-01-30 ENCOUNTER — Other Ambulatory Visit: Payer: Self-pay | Admitting: *Deleted

## 2014-01-30 DIAGNOSIS — C9000 Multiple myeloma not having achieved remission: Secondary | ICD-10-CM

## 2014-01-30 MED ORDER — LENALIDOMIDE 15 MG PO CAPS: 15.0000 mg | ORAL_CAPSULE | Freq: Every day | ORAL | Status: DC

## 2014-02-01 ENCOUNTER — Encounter: Payer: Self-pay | Admitting: Hematology and Oncology

## 2014-02-02 ENCOUNTER — Encounter: Payer: Self-pay | Admitting: Hematology and Oncology

## 2014-02-02 NOTE — Progress Notes (Signed)
Faxed clinical information to [employer] @ [phone], in MD's note she states she is extending patient's disability throughout chemotherapy. °

## 2014-02-03 ENCOUNTER — Encounter: Payer: Self-pay | Admitting: Hematology and Oncology

## 2014-02-03 ENCOUNTER — Telehealth: Payer: Self-pay | Admitting: *Deleted

## 2014-02-03 NOTE — Telephone Encounter (Signed)
Pt requests office note from 2/17 be faxed to [facility] for her disability claim at fax [phone].  Note faxed to above number as requested.

## 2014-02-04 ENCOUNTER — Encounter: Payer: Self-pay | Admitting: Hematology and Oncology

## 2014-02-06 ENCOUNTER — Encounter: Payer: Self-pay | Admitting: Hematology and Oncology

## 2014-02-06 NOTE — Progress Notes (Signed)
Faxed fmla form to [employer] @ [phone] °

## 2014-02-07 ENCOUNTER — Ambulatory Visit (HOSPITAL_BASED_OUTPATIENT_CLINIC_OR_DEPARTMENT_OTHER): Payer: 59 | Admitting: Hematology and Oncology

## 2014-02-07 ENCOUNTER — Other Ambulatory Visit: Payer: Self-pay | Admitting: Hematology and Oncology

## 2014-02-07 ENCOUNTER — Encounter: Payer: Self-pay | Admitting: Hematology and Oncology

## 2014-02-07 ENCOUNTER — Other Ambulatory Visit (HOSPITAL_BASED_OUTPATIENT_CLINIC_OR_DEPARTMENT_OTHER): Payer: 59

## 2014-02-07 ENCOUNTER — Encounter: Payer: 59 | Admitting: *Deleted

## 2014-02-07 ENCOUNTER — Telehealth: Payer: Self-pay | Admitting: Hematology and Oncology

## 2014-02-07 ENCOUNTER — Telehealth: Payer: Self-pay | Admitting: *Deleted

## 2014-02-07 ENCOUNTER — Ambulatory Visit (HOSPITAL_BASED_OUTPATIENT_CLINIC_OR_DEPARTMENT_OTHER): Payer: 59

## 2014-02-07 ENCOUNTER — Ambulatory Visit (HOSPITAL_COMMUNITY)
Admission: RE | Admit: 2014-02-07 | Discharge: 2014-02-07 | Disposition: A | Discharge status: Home / Self Care | Payer: 59 | Source: Ambulatory Visit | Attending: Hematology and Oncology | Admitting: Hematology and Oncology

## 2014-02-07 ENCOUNTER — Ambulatory Visit: Payer: 59

## 2014-02-07 VITALS — BP 122/74 | HR 105 | Temp 98.4°F | Resp 18 | Ht 67.0 in | Wt 184.6 lb

## 2014-02-07 DIAGNOSIS — Z5112 Encounter for antineoplastic immunotherapy: Secondary | ICD-10-CM

## 2014-02-07 DIAGNOSIS — C9000 Multiple myeloma not having achieved remission: Secondary | ICD-10-CM

## 2014-02-07 DIAGNOSIS — M7989 Other specified soft tissue disorders: Secondary | ICD-10-CM | POA: Insufficient documentation

## 2014-02-07 DIAGNOSIS — D649 Anemia, unspecified: Secondary | ICD-10-CM

## 2014-02-07 DIAGNOSIS — M79609 Pain in unspecified limb: Secondary | ICD-10-CM | POA: Insufficient documentation

## 2014-02-07 DIAGNOSIS — I82409 Acute embolism and thrombosis of unspecified deep veins of unspecified lower extremity: Secondary | ICD-10-CM

## 2014-02-07 HISTORY — DX: Other specified soft tissue disorders: M79.89

## 2014-02-07 HISTORY — DX: Acute embolism and thrombosis of unspecified deep veins of unspecified lower extremity: I82.409

## 2014-02-07 LAB — CBC WITH DIFFERENTIAL/PLATELET
BASO%: 0.8 % (ref 0.0–2.0)
Basophils Absolute: 0 10*3/uL (ref 0.0–0.1)
EOS%: 1.1 % (ref 0.0–7.0)
Eosinophils Absolute: 0 10*3/uL (ref 0.0–0.5)
HCT: 32 % — ABNORMAL LOW (ref 34.8–46.6)
HGB: 10.1 g/dL — ABNORMAL LOW (ref 11.6–15.9)
LYMPH%: 23.6 % (ref 14.0–49.7)
MCH: 24.3 pg — ABNORMAL LOW (ref 25.1–34.0)
MCHC: 31.5 g/dL (ref 31.5–36.0)
MCV: 77.3 fL — ABNORMAL LOW (ref 79.5–101.0)
MONO#: 0.7 10*3/uL (ref 0.1–0.9)
MONO%: 16.1 % — ABNORMAL HIGH (ref 0.0–14.0)
NEUT#: 2.4 10*3/uL (ref 1.5–6.5)
NEUT%: 58.4 % (ref 38.4–76.8)
Platelets: 312 10*3/uL (ref 145–400)
RBC: 4.14 10*6/uL (ref 3.70–5.45)
RDW: 19.9 % — ABNORMAL HIGH (ref 11.2–14.5)
WBC: 4.2 10*3/uL (ref 3.9–10.3)
lymph#: 1 10*3/uL (ref 0.9–3.3)

## 2014-02-07 LAB — COMPREHENSIVE METABOLIC PANEL (CC13)
ALT: 20 U/L (ref 0–55)
AST: 13 U/L (ref 5–34)
Albumin: 3.3 g/dL — ABNORMAL LOW (ref 3.5–5.0)
Alkaline Phosphatase: 82 U/L (ref 40–150)
Anion Gap: 9 mEq/L (ref 3–11)
BUN: 7.7 mg/dL (ref 7.0–26.0)
CO2: 25 mEq/L (ref 22–29)
Calcium: 9.5 mg/dL (ref 8.4–10.4)
Chloride: 108 mEq/L (ref 98–109)
Creatinine: 0.9 mg/dL (ref 0.6–1.1)
Glucose: 184 mg/dl — ABNORMAL HIGH (ref 70–140)
Potassium: 3.7 mEq/L (ref 3.5–5.1)
Sodium: 142 mEq/L (ref 136–145)
Total Bilirubin: 0.33 mg/dL (ref 0.20–1.20)
Total Protein: 6.6 g/dL (ref 6.4–8.3)

## 2014-02-07 LAB — LACTATE DEHYDROGENASE (CC13): LDH: 249 U/L — ABNORMAL HIGH (ref 125–245)

## 2014-02-07 MED ORDER — ONDANSETRON HCL 8 MG PO TABS
8.0000 mg | ORAL_TABLET | Freq: Once | ORAL | Status: AC
  Administered 2014-02-07: 8 mg via ORAL

## 2014-02-07 MED ORDER — HEPARIN SOD (PORK) LOCK FLUSH 100 UNIT/ML IV SOLN
500.0000 [IU] | Freq: Once | INTRAVENOUS | Status: AC | PRN
  Administered 2014-02-07: 500 [IU]
  Filled 2014-02-07: qty 5

## 2014-02-07 MED ORDER — DEXAMETHASONE 4 MG PO TABS: 4.0000 mg | ORAL_TABLET | ORAL | Status: DC

## 2014-02-07 MED ORDER — RIVAROXABAN 15 MG PO TABS: 15.0000 mg | ORAL_TABLET | Freq: Two times a day (BID) | ORAL | Status: DC

## 2014-02-07 MED ORDER — ONDANSETRON HCL 8 MG PO TABS
ORAL_TABLET | ORAL | Status: AC
  Filled 2014-02-07: qty 1

## 2014-02-07 MED ORDER — SODIUM CHLORIDE 0.9 % IV SOLN
Freq: Once | INTRAVENOUS | Status: AC
  Administered 2014-02-07: 12:00:00 via INTRAVENOUS

## 2014-02-07 MED ORDER — SULFAMETHOXAZOLE-TRIMETHOPRIM 800-160 MG PO TABS
1.0000 | ORAL_TABLET | ORAL | Status: DC
Start: 2014-02-07 — End: 2014-06-27

## 2014-02-07 MED ORDER — INV-BORTEZOMIB CHEMO SQ INJECTION 3.5 MG CTSU E1A11
1.0000 mg/m2 | Freq: Once | INTRAVENOUS | Status: AC
  Administered 2014-02-07: 2 mg via SUBCUTANEOUS
  Filled 2014-02-07: qty 2

## 2014-02-07 MED ORDER — ZOLEDRONIC ACID 4 MG/100ML IV SOLN
4.0000 mg | Freq: Once | INTRAVENOUS | Status: AC
  Administered 2014-02-07: 4 mg via INTRAVENOUS
  Filled 2014-02-07: qty 100

## 2014-02-07 MED ORDER — RIVAROXABAN 20 MG PO TABS: 20.0000 mg | ORAL_TABLET | Freq: Every day | ORAL | Status: DC

## 2014-02-07 MED ORDER — SODIUM CHLORIDE 0.9 % IJ SOLN
10.0000 mL | INTRAMUSCULAR | Status: DC | PRN
  Administered 2014-02-07: 10 mL
  Filled 2014-02-07: qty 10

## 2014-02-07 NOTE — Telephone Encounter (Signed)
Gave pt appt for doppler @ [facility], today 11 am Linda Davis aware for the appt for precert °

## 2014-02-07 NOTE — Patient Instructions (Addendum)
[facility] Discharge Instructions for Patients Receiving Chemotherapy  Today you received the following chemotherapy agents:  Velcade  To help prevent nausea and vomiting after your treatment, we encourage you to take your nausea medication as ordered per MD.   If you develop nausea and vomiting that is not controlled by your nausea medication, call the clinic.   BELOW ARE SYMPTOMS THAT SHOULD BE REPORTED IMMEDIATELY:  *FEVER GREATER THAN 100.5 F  *CHILLS WITH OR WITHOUT FEVER  NAUSEA AND VOMITING THAT IS NOT CONTROLLED WITH YOUR NAUSEA MEDICATION  *UNUSUAL SHORTNESS OF BREATH  *UNUSUAL BRUISING OR BLEEDING  TENDERNESS IN MOUTH AND THROAT WITH OR WITHOUT PRESENCE OF ULCERS  *URINARY PROBLEMS  *BOWEL PROBLEMS  UNUSUAL RASH Items with * indicate a potential emergency and should be followed up as soon as possible.  Feel free to call the clinic you have any questions or concerns. The clinic phone number is [phone].   Zoledronic Acid injection (Hypercalcemia, Oncology) What is this medicine? ZOLEDRONIC ACID (ZOE le dron ik AS id) lowers the amount of calcium loss from bone. It is used to treat too much calcium in your blood from cancer. It is also used to prevent complications of cancer that has spread to the bone. This medicine may be used for other purposes; ask your health care provider or pharmacist if you have questions. COMMON BRAND NAME(S): Zometa What should I tell my health care provider before I take this medicine? They need to know if you have any of these conditions: -aspirin-sensitive asthma -cancer, especially if you are receiving medicines used to treat cancer -dental disease or wear dentures -infection -kidney disease -receiving corticosteroids like dexamethasone or prednisone -an unusual or allergic reaction to zoledronic acid, other medicines, foods, dyes, or preservatives -pregnant or trying to get pregnant -breast-feeding How  should I use this medicine? This medicine is for infusion into a vein. It is given by a health care professional in a hospital or clinic setting. Talk to your pediatrician regarding the use of this medicine in children. Special care may be needed. Overdosage: If you think you have taken too much of this medicine contact a poison control center or emergency room at once. NOTE: This medicine is only for you. Do not share this medicine with others. What if I miss a dose? It is important not to miss your dose. Call your doctor or health care professional if you are unable to keep an appointment. What may interact with this medicine? -certain antibiotics given by injection -NSAIDs, medicines for pain and inflammation, like ibuprofen or naproxen -some diuretics like bumetanide, furosemide -teriparatide -thalidomide This list may not describe all possible interactions. Give your health care provider a list of all the medicines, herbs, non-prescription drugs, or dietary supplements you use. Also tell them if you smoke, drink alcohol, or use illegal drugs. Some items may interact with your medicine. What should I watch for while using this medicine? Visit your doctor or health care professional for regular checkups. It may be some time before you see the benefit from this medicine. Do not stop taking your medicine unless your doctor tells you to. Your doctor may order blood tests or other tests to see how you are doing. Women should inform their doctor if they wish to become pregnant or think they might be pregnant. There is a potential for serious side effects to an unborn child. Talk to your health care professional or pharmacist for more information. You should make sure   that you get enough calcium and vitamin D while you are taking this medicine. Discuss the foods you eat and the vitamins you take with your health care professional. Some people who take this medicine have severe bone, joint, and/or  muscle pain. This medicine may also increase your risk for jaw problems or a broken thigh bone. Tell your doctor right away if you have severe pain in your jaw, bones, joints, or muscles. Tell your doctor if you have any pain that does not go away or that gets worse. Tell your dentist and dental surgeon that you are taking this medicine. You should not have major dental surgery while on this medicine. See your dentist to have a dental exam and fix any dental problems before starting this medicine. Take good care of your teeth while on this medicine. Make sure you see your dentist for regular follow-up appointments. What side effects may I notice from receiving this medicine? Side effects that you should report to your doctor or health care professional as soon as possible: -allergic reactions like skin rash, itching or hives, swelling of the face, lips, or tongue -anxiety, confusion, or depression -breathing problems -changes in vision -eye pain -feeling faint or lightheaded, falls -jaw pain, especially after dental work -mouth sores -muscle cramps, stiffness, or weakness -trouble passing urine or change in the amount of urine Side effects that usually do not require medical attention (report to your doctor or health care professional if they continue or are bothersome): -bone, joint, or muscle pain -constipation -diarrhea -fever -hair loss -irritation at site where injected -loss of appetite -nausea, vomiting -stomach upset -trouble sleeping -trouble swallowing -weak or tired This list may not describe all possible side effects. Call your doctor for medical advice about side effects. You may report side effects to FDA at 1-800-FDA-1088. Where should I keep my medicine? This drug is given in a hospital or clinic and will not be stored at home. NOTE: This sheet is a summary. It may not cover all possible information. If you have questions about this medicine, talk to your doctor,  pharmacist, or health care provider.  2014, [employer]. (2013-04-28 13:03:13)

## 2014-02-07 NOTE — Progress Notes (Signed)
02/07/2014 Patient in to clinic today for evaluation prior to receiving treatment Cycle 4. Per physical exam by Dr. Alvy Bimler, right lower extremity DVT is suspected. Treatment will be delayed at this time, pending further workup for thrombosis. [name] BSN, RN, [place] 02/07/2014 9:49 AM

## 2014-02-07 NOTE — Progress Notes (Signed)
[place] OFFICE PROGRESS NOTE  Patient Care Team: Wenda Low, MD as PCP - General (Internal Medicine) Heath Lark, MD as Consulting Physician (Hematology and Oncology)  DIAGNOSIS: Multiple myeloma, ongoing treatment  SUMMARY OF ONCOLOGIC HISTORY: Oncology History   ISS stage 1 IgG lambda subtype (serum albumin 3.6, Beta2 microglobulin 2.32) Durie Salmon Stage 1     Multiple myeloma, without mention of having achieved remission   10/10/2013 Imaging Skeletal survery was negative   11/09/2013 Bone Marrow Biopsy BM biopsy confirmed myeloma, 76% involved, IgG lambda subtype   12/06/2013 -  Chemotherapy Start cycle 1 of chemo with revlimid, Velcade, Dexamethasone and Zometa. Patient particpated in clinical research [facility] [phone]    INTERVAL HISTORY: Carla Perry 58 y.o. female returns for further followup. She still complaining of intermittent high sensitivity to the lites. She denies any blurry vision or double vision. She has lost a bit of weight since the last time I saw her, likely due to reduce fluid retention. She denies any bone pain. Her main complaint is new onset of right lower extremity swelling and discomfort in the right calf area. She denies any recent fever, chills, night sweats or abnormal weight loss  I have reviewed the past medical history, past surgical history, social history and family history with the patient and they are unchanged from previous note.  ALLERGIES:  has No Known Allergies.  MEDICATIONS:  Current Outpatient Prescriptions  Medication Sig Dispense Refill  . acyclovir (ZOVIRAX) 400 MG tablet Take 400 mg by mouth 2 (two) times daily.      Marland Kitchen aspirin 81 MG tablet Take 81 mg by mouth daily.      . Botulinum Toxin Type A 200 UNITS SOLR every 3 (three) months. Dispense 200 units. To be injected by physician during office visit      . Cholecalciferol (VITAMIN D3) 2000 UNITS TABS Take 2,000 Units by mouth daily.      Marland Kitchen dexamethasone  (DECADRON) 4 MG tablet Take 1 tablet (4 mg total) by mouth as directed. Take 10 tablets every week  60 tablet  1  . hydrochlorothiazide (HYDRODIURIL) 25 MG tablet Take 25 mg by mouth daily with breakfast.       . lenalidomide (REVLIMID) 15 MG capsule Take 1 capsule (15 mg total) by mouth daily.  14 capsule  0  . levothyroxine (SYNTHROID, LEVOTHROID) 75 MCG tablet Take 75 mcg by mouth daily before breakfast.      . lidocaine-prilocaine (EMLA) cream Apply 1 application topically as needed (port).      . morphine (MSIR) 15 MG tablet Take 15 mg by mouth every 4 (four) hours as needed for moderate pain or severe pain.      . Multiple Vitamins-Minerals (CENTRUM SILVER PO) Take 1 tablet by mouth daily.       . ondansetron (ZOFRAN) 8 MG tablet Take 8 mg by mouth every 8 (eight) hours as needed for nausea.      . ondansetron (ZOFRAN) 8 MG tablet Take 1 tablet (8 mg total) by mouth every 8 (eight) hours as needed.  30 tablet  1  . polyethylene glycol (MIRALAX / GLYCOLAX) packet Take 17 g by mouth daily.      . prochlorperazine (COMPAZINE) 10 MG tablet Take 1 tablet (10 mg total) by mouth every 6 (six) hours as needed (Nausea or vomiting).  30 tablet  1  . sulfamethoxazole-trimethoprim (BACTRIM DS,SEPTRA DS) 800-160 MG per tablet Take 1 tablet by mouth as directed. 1 tab  twice a day on Mondays, Wednesday and Friday  60 tablet  1  . traMADol (ULTRAM) 50 MG tablet Take 1 tablet (50 mg total) by mouth every 6 (six) hours as needed for moderate pain or severe pain.  60 tablet  0  . venlafaxine XR (EFFEXOR-XR) 75 MG 24 hr capsule Take 1 capsule (75 mg total) by mouth 2 (two) times daily.  10 capsule  1  . verapamil (VERELAN PM) 120 MG 24 hr capsule Take 120 mg by mouth at bedtime.       No current facility-administered medications for this visit.    REVIEW OF SYSTEMS:   Constitutional: Denies fevers, chills or abnormal weight loss Eyes: Denies blurriness of vision Ears, nose, mouth, throat, and face: Denies  mucositis or sore throat Respiratory: Denies cough, dyspnea or wheezes Cardiovascular: Denies palpitation, chest discomfort  Gastrointestinal:  Denies nausea, heartburn or change in bowel habits Skin: Denies abnormal skin rashes Lymphatics: Denies new lymphadenopathy or easy bruising Neurological:Denies numbness, tingling or new weaknesses Behavioral/Psych: Mood is stable, no new changes  All other systems were reviewed with the patient and are negative.  PHYSICAL EXAMINATION: ECOG PERFORMANCE STATUS: 1 - Symptomatic but completely ambulatory  Filed Vitals:   02/07/14 0908  BP: 122/74  Pulse: 105  Temp: 98.4 F (36.9 C)  Resp: 18   Filed Weights   02/07/14 0908  Weight: 184 lb 9.6 oz (83.734 kg)    GENERAL:alert, no distress and comfortable. She looked mildly cushingoid SKIN: skin color, texture, turgor are normal, no rashes or significant lesions EYES: normal, Conjunctiva are pink and non-injected, sclera clear OROPHARYNX:no exudate, no erythema and lips, buccal mucosa, and tongue normal  NECK: supple, thyroid normal size, non-tender, without nodularity LYMPH:  no palpable lymphadenopathy in the cervical, axillary or inguinal LUNGS: clear to auscultation and percussion with normal breathing effort HEART: regular rate & rhythm and no murmurs. Noted mild redness and swelling on the right lower extremity with mild edema ABDOMEN:abdomen soft, non-tender and normal bowel sounds Musculoskeletal:no cyanosis of digits and no clubbing  NEURO: alert & oriented x 3 with fluent speech, no focal motor/sensory deficits  LABORATORY DATA:  I have reviewed the data as listed    Component Value Date/Time   NA 142 02/07/2014 0853   K 3.7 02/07/2014 0853   CO2 25 02/07/2014 0853   GLUCOSE 184* 02/07/2014 0853   BUN 7.7 02/07/2014 0853   CREATININE 0.9 02/07/2014 0853   CALCIUM 9.5 02/07/2014 0853   PROT 6.6 02/07/2014 0853   ALBUMIN 3.3* 02/07/2014 0853   AST 13 02/07/2014 0853   ALT 20  02/07/2014 0853   ALKPHOS 82 02/07/2014 0853   BILITOT 0.33 02/07/2014 0853    No results found for this basename: SPEP,  UPEP,   kappa and lambda light chains    Lab Results  Component Value Date   WBC 4.2 02/07/2014   NEUTROABS 2.4 02/07/2014   HGB 10.1* 02/07/2014   HCT 32.0* 02/07/2014   MCV 77.3* 02/07/2014   PLT 312 02/07/2014      Chemistry      Component Value Date/Time   NA 142 02/07/2014 0853   K 3.7 02/07/2014 0853   CO2 25 02/07/2014 0853   BUN 7.7 02/07/2014 0853   CREATININE 0.9 02/07/2014 0853      Component Value Date/Time   CALCIUM 9.5 02/07/2014 0853   ALKPHOS 82 02/07/2014 0853   AST 13 02/07/2014 0853   ALT 20 02/07/2014 0853   BILITOT  0.33 02/07/2014 0853      ASSESSMENT & PLAN:  #1 multiple myeloma I will hold treatment until further evaluation for concerns of new DVT on the right leg. Based on the most recent blood work, the patient has achieved partial response #2 right lower extremity pain and swelling I am concerned about DVT. I will order an urgent ultrasound evaluation #3 mild cushingoid syndrome with hyperglycemia I suspect this is due to dexamethasone. I will consult with our research nurse for possible adjustment to the dose of dexamethasone #4 sensitivity to light This is an unusual side effects. I suspect the patient may have migraine headaches unrelated to treatment #5 antimicrobial prophylaxis I also recommend supportive care with prophylactic antibiotic coverage with Bactrim as well as acyclovir #6 bone health She will continue calcium, vitamin D and monthly Zometa #7 anemia Was related to her underlying disease. It is improving. She is not symptomatic. Orders Placed This Encounter  Procedures  . Lower Extremity Venous Duplex Right    Standing Status: Future     Number of Occurrences: 1     Standing Expiration Date: 02/07/2015    Scheduling Instructions:     Right leg swelling and pain, r/o DVT    Order Specific Question:  Laterality     Answer:  Right    Order Specific Question:  Where should this test be performed:    Answer:  [facility]   All questions were answered. The patient knows to call the clinic with any problems, questions or concerns. No barriers to learning was detected. I spent 40 minutes counseling the patient face to face. The total time spent in the appointment was 55 minutes and more than 50% was on counseling and review of test results     [facility], [place], MD 02/07/2014 10:13 AM

## 2014-02-07 NOTE — Telephone Encounter (Signed)
Pt scheduled for BMBx at Short Stay on 3/26 at 8 am.  Notified Carter in flow cytometry.  Pt to arrive at 7 am,  NPO after midnight and needs driver home.  Gave written instructions with date/time to pt in infusion room.  She verbalized understanding.

## 2014-02-07 NOTE — Telephone Encounter (Signed)
RECEIVED A FAX FROM [facility] CONCERNING A CONFIRMATION OF PRESCRIPTION SHIPMENT FOR REVLIMID ON 02/06/14. 

## 2014-02-07 NOTE — Progress Notes (Signed)
*  PRELIMINARY RESULTS* Vascular Ultrasound Right lower extremity venous duplex has been completed.  Preliminary findings: Evidence of DVT involving the right posterior tibial and peroneal veins.  Called report to Dr. Alvy Bimler. Patient instructed to go back to cancer center.  Landry Mellow, RDMS, RVT  02/07/2014, 10:50 AM

## 2014-02-07 NOTE — Telephone Encounter (Signed)
Message copied by Cathlean Cower on Tue Feb 07, 2014 12:32 PM ------      Message from: [facility], [place]: Tue Feb 07, 2014 11:25 AM      Regarding: sedated BM at short stay       Let's plan on sedated BM at [facility] on 3/25 or 3/26      Cameo, can you please call to schedule?      Thanks ------

## 2014-02-07 NOTE — Progress Notes (Signed)
The patient is diagnosed with right lower extremity DVT involving the posterior tibial and peroneal veins. I discussed with her risk, benefits and side effects of various choices of anticoagulation therapy. The patient elected to be treated with Xarelto. I discussed this extensively with our research coordinator. The patient can proceed with treatment today without delay.

## 2014-02-08 ENCOUNTER — Other Ambulatory Visit: Payer: Self-pay | Admitting: Hematology and Oncology

## 2014-02-08 ENCOUNTER — Telehealth: Payer: Self-pay | Admitting: Hematology and Oncology

## 2014-02-08 DIAGNOSIS — C9000 Multiple myeloma not having achieved remission: Secondary | ICD-10-CM

## 2014-02-08 NOTE — Telephone Encounter (Signed)
Talked to pt and she is aware of appt on 3/13th and she will come by to get appt calendar for [facility]Arch and April 2015

## 2014-02-09 ENCOUNTER — Telehealth: Payer: Self-pay | Admitting: *Deleted

## 2014-02-09 NOTE — Progress Notes (Signed)
02/07/2014 Determination of grade 2 venous thrombosis made per investigator's assessment of doppler ultrasound report. Per protocol, therapy may continue as planned. Patient to resume oral decadron and lenalidomide dosing as soon as medication is delivered (anticipated for 2:00pm on Tuesday 3/10). Sign for infusion given to [facility] RN. [name] BSN, RN, [place] 02/09/2014 3:28 PM

## 2014-02-09 NOTE — Telephone Encounter (Signed)
Per staff message and POF I have scheduled appts.  JMW  

## 2014-02-10 ENCOUNTER — Ambulatory Visit (HOSPITAL_BASED_OUTPATIENT_CLINIC_OR_DEPARTMENT_OTHER): Payer: 59

## 2014-02-10 ENCOUNTER — Encounter: Payer: Self-pay | Admitting: *Deleted

## 2014-02-10 VITALS — BP 129/70 | HR 83 | Temp 97.8°F | Resp 20

## 2014-02-10 DIAGNOSIS — C9000 Multiple myeloma not having achieved remission: Secondary | ICD-10-CM

## 2014-02-10 DIAGNOSIS — Z5112 Encounter for antineoplastic immunotherapy: Secondary | ICD-10-CM

## 2014-02-10 LAB — SPEP & IFE WITH QIG
Albumin ELP: 57.5 % (ref 55.8–66.1)
Alpha-1-Globulin: 5.9 % — ABNORMAL HIGH (ref 2.9–4.9)
Alpha-2-Globulin: 12.1 % — ABNORMAL HIGH (ref 7.1–11.8)
Beta 2: 5.3 % (ref 3.2–6.5)
Beta Globulin: 6.4 % (ref 4.7–7.2)
Gamma Globulin: 12.8 % (ref 11.1–18.8)
IgA: 12 mg/dL — ABNORMAL LOW (ref 69–380)
IgG (Immunoglobin G), Serum: 850 mg/dL (ref 690–1700)
IgM, Serum: 35 mg/dL — ABNORMAL LOW (ref 52–322)
M-Spike, %: 0.51 g/dL
Total Protein, Serum Electrophoresis: 6 g/dL (ref 6.0–8.3)

## 2014-02-10 LAB — KAPPA/LAMBDA LIGHT CHAINS
Kappa free light chain: 1.01 mg/dL (ref 0.33–1.94)
Kappa:Lambda Ratio: 0.42 (ref 0.26–1.65)
Lambda Free Lght Chn: 2.39 mg/dL (ref 0.57–2.63)

## 2014-02-10 MED ORDER — ONDANSETRON HCL 8 MG PO TABS
8.0000 mg | ORAL_TABLET | Freq: Once | ORAL | Status: AC
  Administered 2014-02-10: 8 mg via ORAL

## 2014-02-10 MED ORDER — ONDANSETRON HCL 8 MG PO TABS
ORAL_TABLET | ORAL | Status: AC
  Filled 2014-02-10: qty 1

## 2014-02-10 MED ORDER — INV-BORTEZOMIB CHEMO SQ INJECTION 3.5 MG CTSU E1A11
1.0000 mg/m2 | Freq: Once | INTRAVENOUS | Status: AC
  Administered 2014-02-10: 2 mg via SUBCUTANEOUS
  Filled 2014-02-10: qty 2

## 2014-02-10 NOTE — Progress Notes (Signed)
02/10/2014 Patient in to clinic today for Day 4 treatment. She confirms that she received her Revlimid on 02/07/2014 and began dosing as planned. Discussed plans for bone marrow procedures on 02/23/2014, including obtaining additional bone marrow aspirate and core samples, as well as peripheral blood samples, for the research study. Patient voiced understanding of procedures. Sign for injection given to [facility]. [name] BSN, RN, CCRP 02/10/2014 11:19 AM

## 2014-02-10 NOTE — Patient Instructions (Signed)
[facility] Discharge Instructions for Patients Receiving Chemotherapy  Today you received the following chemotherapy agents: Velcade.  To help prevent nausea and vomiting after your treatment, we encourage you to take your nausea medication as prescribed.   If you develop nausea and vomiting that is not controlled by your nausea medication, call the clinic.   BELOW ARE SYMPTOMS THAT SHOULD BE REPORTED IMMEDIATELY:  *FEVER GREATER THAN 100.5 F  *CHILLS WITH OR WITHOUT FEVER  NAUSEA AND VOMITING THAT IS NOT CONTROLLED WITH YOUR NAUSEA MEDICATION  *UNUSUAL SHORTNESS OF BREATH  *UNUSUAL BRUISING OR BLEEDING  TENDERNESS IN MOUTH AND THROAT WITH OR WITHOUT PRESENCE OF ULCERS  *URINARY PROBLEMS  *BOWEL PROBLEMS  UNUSUAL RASH Items with * indicate a potential emergency and should be followed up as soon as possible.  Feel free to call the clinic you have any questions or concerns. The clinic phone number is [phone].    

## 2014-02-13 ENCOUNTER — Telehealth: Payer: Self-pay | Admitting: Hematology and Oncology

## 2014-02-13 NOTE — Telephone Encounter (Signed)
Talked to pt and gave her appt for tomorrow advised her to get appt cal[facility] for march and April 2015

## 2014-02-14 ENCOUNTER — Ambulatory Visit (HOSPITAL_BASED_OUTPATIENT_CLINIC_OR_DEPARTMENT_OTHER): Payer: 59

## 2014-02-14 ENCOUNTER — Other Ambulatory Visit (HOSPITAL_BASED_OUTPATIENT_CLINIC_OR_DEPARTMENT_OTHER): Payer: 59

## 2014-02-14 ENCOUNTER — Telehealth: Payer: Self-pay | Admitting: *Deleted

## 2014-02-14 ENCOUNTER — Encounter: Payer: Self-pay | Admitting: *Deleted

## 2014-02-14 VITALS — BP 120/72 | HR 87 | Temp 98.1°F | Resp 18

## 2014-02-14 DIAGNOSIS — C9000 Multiple myeloma not having achieved remission: Secondary | ICD-10-CM

## 2014-02-14 DIAGNOSIS — Z5112 Encounter for antineoplastic immunotherapy: Secondary | ICD-10-CM

## 2014-02-14 LAB — CBC WITH DIFFERENTIAL/PLATELET
BASO%: 0.3 % (ref 0.0–2.0)
Basophils Absolute: 0 10*3/uL (ref 0.0–0.1)
EOS%: 10.3 % — ABNORMAL HIGH (ref 0.0–7.0)
Eosinophils Absolute: 0.4 10*3/uL (ref 0.0–0.5)
HCT: 31.6 % — ABNORMAL LOW (ref 34.8–46.6)
HGB: 10 g/dL — ABNORMAL LOW (ref 11.6–15.9)
LYMPH%: 19.9 % (ref 14.0–49.7)
MCH: 24.2 pg — ABNORMAL LOW (ref 25.1–34.0)
MCHC: 31.5 g/dL (ref 31.5–36.0)
MCV: 76.8 fL — ABNORMAL LOW (ref 79.5–101.0)
MONO#: 0.3 10*3/uL (ref 0.1–0.9)
MONO%: 7.3 % (ref 0.0–14.0)
NEUT#: 2.7 10*3/uL (ref 1.5–6.5)
NEUT%: 62.2 % (ref 38.4–76.8)
Platelets: 210 10*3/uL (ref 145–400)
RBC: 4.12 10*6/uL (ref 3.70–5.45)
RDW: 19 % — ABNORMAL HIGH (ref 11.2–14.5)
WBC: 4.3 10*3/uL (ref 3.9–10.3)
lymph#: 0.9 10*3/uL (ref 0.9–3.3)

## 2014-02-14 LAB — TSH CHCC: TSH: 1.022 m(IU)/L (ref 0.308–3.960)

## 2014-02-14 MED ORDER — ONDANSETRON HCL 8 MG PO TABS
ORAL_TABLET | ORAL | Status: AC
  Filled 2014-02-14: qty 1

## 2014-02-14 MED ORDER — INV-BORTEZOMIB CHEMO SQ INJECTION 3.5 MG CTSU E1A11
1.0000 mg/m2 | Freq: Once | INTRAVENOUS | Status: AC
  Administered 2014-02-14: 2 mg via SUBCUTANEOUS
  Filled 2014-02-14: qty 2

## 2014-02-14 MED ORDER — ONDANSETRON HCL 8 MG PO TABS
8.0000 mg | ORAL_TABLET | Freq: Once | ORAL | Status: AC
  Administered 2014-02-14: 8 mg via ORAL

## 2014-02-14 NOTE — Progress Notes (Signed)
02/14/2014 Patient in to clinic today for Day 8 treatment. CBC results are acceptable for continued treatment. Sign for infusion given to Jaci Carrel RN. [name] BSN, RN, CCRP 02/14/2014 8:14 AM

## 2014-02-14 NOTE — Telephone Encounter (Signed)
I have called the patient with her appts

## 2014-02-14 NOTE — Patient Instructions (Signed)
[facility] Discharge Instructions for Patients Receiving Chemotherapy  Today you received the following chemotherapy agents: Velcade. To help prevent nausea and vomiting after your treatment, we encourage you to take your nausea medication.  If you develop nausea and vomiting that is not controlled by your nausea medication, call the clinic.   BELOW ARE SYMPTOMS THAT SHOULD BE REPORTED IMMEDIATELY:  *FEVER GREATER THAN 100.5 F  *CHILLS WITH OR WITHOUT FEVER  NAUSEA AND VOMITING THAT IS NOT CONTROLLED WITH YOUR NAUSEA MEDICATION  *UNUSUAL SHORTNESS OF BREATH  *UNUSUAL BRUISING OR BLEEDING  TENDERNESS IN MOUTH AND THROAT WITH OR WITHOUT PRESENCE OF ULCERS  *URINARY PROBLEMS  *BOWEL PROBLEMS  UNUSUAL RASH Items with * indicate a potential emergency and should be followed up as soon as possible.  Feel free to call the clinic you have any questions or concerns. The clinic phone number is [phone].    

## 2014-02-14 NOTE — Telephone Encounter (Signed)
Per staff phone call and POF I have schedueld appts.  JMW  

## 2014-02-15 ENCOUNTER — Encounter: Payer: Self-pay | Admitting: Hematology and Oncology

## 2014-02-15 NOTE — Progress Notes (Signed)
Faxed 3/10 office visit note to [facility] @ [phone]

## 2014-02-17 ENCOUNTER — Ambulatory Visit (HOSPITAL_BASED_OUTPATIENT_CLINIC_OR_DEPARTMENT_OTHER): Payer: 59

## 2014-02-17 ENCOUNTER — Encounter: Payer: Self-pay | Admitting: *Deleted

## 2014-02-17 VITALS — BP 133/70 | HR 86 | Temp 97.4°F

## 2014-02-17 DIAGNOSIS — Z5112 Encounter for antineoplastic immunotherapy: Secondary | ICD-10-CM

## 2014-02-17 DIAGNOSIS — C9000 Multiple myeloma not having achieved remission: Secondary | ICD-10-CM

## 2014-02-17 MED ORDER — ONDANSETRON HCL 8 MG PO TABS
ORAL_TABLET | ORAL | Status: AC
  Filled 2014-02-17: qty 1

## 2014-02-17 MED ORDER — INV-BORTEZOMIB CHEMO SQ INJECTION 3.5 MG CTSU E1A11
1.0000 mg/m2 | Freq: Once | INTRAVENOUS | Status: AC
  Administered 2014-02-17: 2 mg via SUBCUTANEOUS
  Filled 2014-02-17: qty 2

## 2014-02-17 MED ORDER — ONDANSETRON HCL 8 MG PO TABS
8.0000 mg | ORAL_TABLET | Freq: Once | ORAL | Status: AC
  Administered 2014-02-17: 8 mg via ORAL

## 2014-02-17 NOTE — Progress Notes (Signed)
02/17/2014 Patient in to clinic today for Day 11 treatment. Sign for injection given to [facility] RN. [name] BSN, RN, [place] 02/17/2014 12:48 PM

## 2014-02-17 NOTE — Patient Instructions (Signed)
[facility] Discharge Instructions for Patients Receiving Chemotherapy  Today you received the following chemotherapy agents Velcade.  To help prevent nausea and vomiting after your treatment, we encourage you to take your nausea medication as directed.    If you develop nausea and vomiting that is not controlled by your nausea medication, call the clinic.   BELOW ARE SYMPTOMS THAT SHOULD BE REPORTED IMMEDIATELY:  *FEVER GREATER THAN 100.5 F  *CHILLS WITH OR WITHOUT FEVER  NAUSEA AND VOMITING THAT IS NOT CONTROLLED WITH YOUR NAUSEA MEDICATION  *UNUSUAL SHORTNESS OF BREATH  *UNUSUAL BRUISING OR BLEEDING  TENDERNESS IN MOUTH AND THROAT WITH OR WITHOUT PRESENCE OF ULCERS  *URINARY PROBLEMS  *BOWEL PROBLEMS  UNUSUAL RASH Items with * indicate a potential emergency and should be followed up as soon as possible.  Feel free to call the clinic you have any questions or concerns. The clinic phone number is [phone].    

## 2014-02-20 ENCOUNTER — Other Ambulatory Visit: Payer: Self-pay | Admitting: *Deleted

## 2014-02-20 DIAGNOSIS — C9000 Multiple myeloma not having achieved remission: Secondary | ICD-10-CM

## 2014-02-20 NOTE — Telephone Encounter (Signed)
THIS REFILL REQUEST FOR REVLIMID WAS PLACED IN DR.GORSUCH'S ACTIVE WORK FOLDER. 

## 2014-02-21 MED ORDER — LENALIDOMIDE 15 MG PO CAPS: 15.0000 mg | ORAL_CAPSULE | Freq: Every day | ORAL | Status: DC

## 2014-02-21 NOTE — Telephone Encounter (Signed)
RECEIVED A FAX FROM [employer] CONCERNING A CONFIRMATION OF FACSIMILE RECEIPT FOR PT. REFERRAL. 

## 2014-02-21 NOTE — Addendum Note (Signed)
Addended by: Wyonia Hough on: 02/21/2014 11:53 AM   Modules accepted: Orders

## 2014-02-22 ENCOUNTER — Encounter (HOSPITAL_COMMUNITY): Payer: Self-pay | Admitting: Pharmacy Technician

## 2014-02-23 ENCOUNTER — Other Ambulatory Visit: Payer: Self-pay | Admitting: Hematology and Oncology

## 2014-02-23 ENCOUNTER — Encounter (HOSPITAL_COMMUNITY): Payer: Self-pay

## 2014-02-23 ENCOUNTER — Ambulatory Visit (HOSPITAL_COMMUNITY)
Admission: RE | Admit: 2014-02-23 | Discharge: 2014-02-23 | Disposition: A | Discharge status: Home / Self Care | Payer: 59 | Source: Ambulatory Visit | Attending: Hematology and Oncology | Admitting: Hematology and Oncology

## 2014-02-23 VITALS — BP 108/56 | HR 66 | Temp 97.9°F | Resp 16 | Ht 67.0 in | Wt 184.0 lb

## 2014-02-23 DIAGNOSIS — I82409 Acute embolism and thrombosis of unspecified deep veins of unspecified lower extremity: Secondary | ICD-10-CM

## 2014-02-23 DIAGNOSIS — D509 Iron deficiency anemia, unspecified: Secondary | ICD-10-CM | POA: Insufficient documentation

## 2014-02-23 DIAGNOSIS — C9000 Multiple myeloma not having achieved remission: Secondary | ICD-10-CM

## 2014-02-23 DIAGNOSIS — D72822 Plasmacytosis: Secondary | ICD-10-CM | POA: Insufficient documentation

## 2014-02-23 LAB — CBC WITH DIFFERENTIAL/PLATELET
Basophils Absolute: 0 10*3/uL (ref 0.0–0.1)
Basophils Relative: 0 % (ref 0–1)
Eosinophils Absolute: 0.2 10*3/uL (ref 0.0–0.7)
Eosinophils Relative: 4 % (ref 0–5)
HCT: 32.1 % — ABNORMAL LOW (ref 36.0–46.0)
Hemoglobin: 10.2 g/dL — ABNORMAL LOW (ref 12.0–15.0)
Lymphocytes Relative: 28 % (ref 12–46)
Lymphs Abs: 1.1 10*3/uL (ref 0.7–4.0)
MCH: 23.9 pg — ABNORMAL LOW (ref 26.0–34.0)
MCHC: 31.8 g/dL (ref 30.0–36.0)
MCV: 75.4 fL — ABNORMAL LOW (ref 78.0–100.0)
Monocytes Absolute: 0.6 10*3/uL (ref 0.1–1.0)
Monocytes Relative: 15 % — ABNORMAL HIGH (ref 3–12)
Neutro Abs: 2.2 10*3/uL (ref 1.7–7.7)
Neutrophils Relative %: 54 % (ref 43–77)
Platelets: 312 10*3/uL (ref 150–400)
RBC: 4.26 MIL/uL (ref 3.87–5.11)
RDW: 17.5 % — ABNORMAL HIGH (ref 11.5–15.5)
WBC: 4.1 10*3/uL (ref 4.0–10.5)

## 2014-02-23 LAB — BONE MARROW EXAM

## 2014-02-23 MED ORDER — MORPHINE SULFATE 10 MG/ML IJ SOLN
INTRAMUSCULAR | Status: AC | PRN
  Administered 2014-02-23: 4 mg via INTRAVENOUS
  Administered 2014-02-23: 2 mg via INTRAVENOUS

## 2014-02-23 MED ORDER — SODIUM CHLORIDE 0.9 % IV SOLN
INTRAVENOUS | Status: DC
  Administered 2014-02-23: 20 mL/h via INTRAVENOUS

## 2014-02-23 MED ORDER — MORPHINE SULFATE 10 MG/ML IJ SOLN
10.0000 mg | Freq: Once | INTRAMUSCULAR | Status: DC
  Filled 2014-02-23: qty 1

## 2014-02-23 MED ORDER — RIVAROXABAN 20 MG PO TABS: 20.0000 mg | ORAL_TABLET | Freq: Every day | ORAL | Status: DC

## 2014-02-23 MED ORDER — MIDAZOLAM HCL 2 MG/2ML IJ SOLN
INTRAMUSCULAR | Status: AC | PRN
  Administered 2014-02-23: 6 mg via INTRAVENOUS
  Administered 2014-02-23: 2 mg via INTRAVENOUS

## 2014-02-23 MED ORDER — MIDAZOLAM HCL 10 MG/2ML IJ SOLN
10.0000 mg | Freq: Once | INTRAMUSCULAR | Status: DC
  Filled 2014-02-23: qty 2

## 2014-02-23 MED ORDER — HEPARIN SOD (PORK) LOCK FLUSH 100 UNIT/ML IV SOLN
500.0000 [IU] | INTRAVENOUS | Status: AC | PRN
  Administered 2014-02-23: 500 [IU]
  Filled 2014-02-23: qty 5

## 2014-02-23 NOTE — Procedures (Signed)
Brief examination was performed. ENT: adequate airway clearance Heart: regular rate and rhythm.No Murmurs Lungs: clear to auscultation, no wheezes, normal respiratory effort  American Society of Anesthesiologists ASA scale 1  Mallampati Score of 4  Bone Marrow Biopsy and Aspiration Procedure Note   Informed consent was obtained and potential risks including bleeding, infection and pain were reviewed with the patient. I verified that the patient has been fasting since midnight.  The patient's name, date of birth, identification, consent and allergies were verified prior to the start of procedure and time out was performed.  A total of 8 mg of IV Versed and 6 mg of IV morphine were given.  The right posterior iliac crest was chosen as the site of biopsy.  The skin was prepped with Betadine solution.   8 cc of 1% lidocaine was used to provide local anaesthesia.   12 cc of bone marrow aspirate was obtained followed by 1 inch biopsy.   The procedure was tolerated well and there were no complications.  The patient was stable at the end of the procedure.  Specimens sent for flow cytometry, cytogenetics and additional studies.

## 2014-02-23 NOTE — Sedation Documentation (Signed)
Patient denies pain and is resting comfortably.  

## 2014-02-23 NOTE — Discharge Instructions (Signed)
Moderate Sedation, Adult °Moderate sedation is given to help you relax or even sleep through a procedure. You may remain sleepy, be clumsy, or have poor balance for several hours following this procedure. Arrange for a responsible adult, family member, or friend to take you home. A responsible adult should stay with you for at least 24 hours or until the medicines have worn off. °· Do not participate in any activities where you could become injured for the next 24 hours, or until you feel normal again. Do not: °· Drive. °· Swim. °· Ride a bicycle. °· Operate heavy machinery. °· Cook. °· Use power tools. °· Climb ladders. °· Work at heights. °· Do not make important decisions or sign legal documents until you are improved. °· Vomiting may occur if you eat too soon. When you can drink without vomiting, try water, juice, or soup. Try solid foods if you feel little or no nausea. °· Only take over-the-counter or prescription medications for pain, discomfort, or fever as directed by your caregiver.If pain medications have been prescribed for you, ask your caregiver how soon it is safe to take them. °· Make sure you and your family fully understands everything about the medication given to you. Make sure you understand what side effects may occur. °· You should not drink alcohol, take sleeping pills, or medications that cause drowsiness for at least 24 hours. °· If you smoke, do not smoke alone. °· If you are feeling better, you may resume normal activities 24 hours after receiving sedation. °· Keep all appointments as scheduled. Follow all instructions. °· Ask questions if you do not understand. °SEEK MEDICAL CARE IF:  °· Your skin is pale or bluish in color. °· You continue to feel sick to your stomach (nauseous) or throw up (vomit). °· Your pain is getting worse and not helped by medication. °· You have bleeding or swelling. °· You are still sleepy or feeling clumsy after 24 hours. °SEEK IMMEDIATE MEDICAL CARE IF:   °· You develop a rash. °· You have difficulty breathing. °· You develop any type of allergic problem. °· You have a fever. °Document Released: 08/12/2001 Document Revised: 02/09/2012 Document Reviewed: 07/25/2013 °ExitCare® Patient Information ©2014 [employer]. °Bone Marrow Aspiration, Bone Marrow Biopsy °Care After °Read the instructions outlined below and refer to this sheet in the next few weeks. These discharge instructions provide you with general information on caring for yourself after you leave the hospital. Your caregiver may also give you specific instructions. While your treatment has been planned according to the most current medical practices available, unavoidable complications occasionally occur. If you have any problems or questions after discharge, call your caregiver. °FINDING OUT THE RESULTS OF YOUR TEST °Not all test results are available during your visit. If your test results are not back during the visit, make an appointment with your caregiver to find out the results. Do not assume everything is normal if you have not heard from your caregiver or the medical facility. It is important for you to follow up on all of your test results.  °HOME CARE INSTRUCTIONS  °You have had sedation and may be sleepy or dizzy. Your thinking may not be as clear as usual. For the next 24 hours: °· Only take over-the-counter or prescription medicines for pain, discomfort, and or fever as directed by your caregiver. °· Do not drink alcohol. °· Do not smoke. °· Do not drive. °· Do not make important legal decisions. °· Do not operate heavy machinery. °·   Do not care for small children by yourself. °· Keep your dressing clean and dry. You may replace dressing with a bandage after 24 hours. °· You may take a bath or shower after 24 hours. °· Use an ice pack for 20 minutes every 2 hours while awake for pain as needed. °SEEK MEDICAL CARE IF:  °· There is redness, swelling, or increasing pain at the biopsy  site. °· There is pus coming from the biopsy site. °· There is drainage from a biopsy site lasting longer than one day. °· An unexplained oral temperature above 102° F (38.9° C) develops. °SEEK IMMEDIATE MEDICAL CARE IF:  °· You develop a rash. °· You have difficulty breathing. °· You develop any reaction or side effects to medications given. °Document Released: 06/06/2005 Document Revised: 02/09/2012 Document Reviewed: 11/14/2008 °ExitCare® Patient Information ©2014 [employer]. ° °

## 2014-02-23 NOTE — Addendum Note (Signed)
Addended by: Benson Norway on: 02/23/2014 10:35 AM   Modules accepted: Orders

## 2014-02-27 ENCOUNTER — Telehealth: Payer: Self-pay | Admitting: *Deleted

## 2014-02-27 NOTE — Telephone Encounter (Signed)
Pt called research RN this morning about her Revlimid refill.  She has not got it yet and due to start tomorrow.  Rx was sent to [facility] on 3/26.  Called [facility] and they state they tried to contact pt on Friday but could not reach her.  They will call her again today.  Gave them her cell phone number.   Called pt and by that time pt states [facility] already called her and will deliver tomorrow.

## 2014-02-28 ENCOUNTER — Ambulatory Visit (HOSPITAL_BASED_OUTPATIENT_CLINIC_OR_DEPARTMENT_OTHER): Payer: 59 | Admitting: Hematology and Oncology

## 2014-02-28 ENCOUNTER — Other Ambulatory Visit (HOSPITAL_BASED_OUTPATIENT_CLINIC_OR_DEPARTMENT_OTHER): Payer: 59

## 2014-02-28 ENCOUNTER — Encounter: Payer: 59 | Admitting: *Deleted

## 2014-02-28 ENCOUNTER — Ambulatory Visit (HOSPITAL_BASED_OUTPATIENT_CLINIC_OR_DEPARTMENT_OTHER): Payer: 59

## 2014-02-28 ENCOUNTER — Telehealth: Payer: Self-pay | Admitting: Hematology and Oncology

## 2014-02-28 VITALS — BP 127/68 | HR 80 | Temp 98.0°F | Resp 18 | Ht 67.0 in | Wt 185.8 lb

## 2014-02-28 DIAGNOSIS — C9000 Multiple myeloma not having achieved remission: Secondary | ICD-10-CM

## 2014-02-28 DIAGNOSIS — D72819 Decreased white blood cell count, unspecified: Secondary | ICD-10-CM

## 2014-02-28 DIAGNOSIS — D701 Agranulocytosis secondary to cancer chemotherapy: Secondary | ICD-10-CM

## 2014-02-28 DIAGNOSIS — Z5112 Encounter for antineoplastic immunotherapy: Secondary | ICD-10-CM

## 2014-02-28 DIAGNOSIS — E876 Hypokalemia: Secondary | ICD-10-CM

## 2014-02-28 DIAGNOSIS — T451X5A Adverse effect of antineoplastic and immunosuppressive drugs, initial encounter: Secondary | ICD-10-CM

## 2014-02-28 DIAGNOSIS — D649 Anemia, unspecified: Secondary | ICD-10-CM

## 2014-02-28 DIAGNOSIS — I82409 Acute embolism and thrombosis of unspecified deep veins of unspecified lower extremity: Secondary | ICD-10-CM

## 2014-02-28 LAB — CBC WITH DIFFERENTIAL/PLATELET
BASO%: 1.1 % (ref 0.0–2.0)
Basophils Absolute: 0 10*3/uL (ref 0.0–0.1)
EOS%: 1.5 % (ref 0.0–7.0)
Eosinophils Absolute: 0.1 10*3/uL (ref 0.0–0.5)
HCT: 30.8 % — ABNORMAL LOW (ref 34.8–46.6)
HGB: 9.6 g/dL — ABNORMAL LOW (ref 11.6–15.9)
LYMPH%: 28.9 % (ref 14.0–49.7)
MCH: 24 pg — ABNORMAL LOW (ref 25.1–34.0)
MCHC: 31.2 g/dL — ABNORMAL LOW (ref 31.5–36.0)
MCV: 76.9 fL — ABNORMAL LOW (ref 79.5–101.0)
MONO#: 0.6 10*3/uL (ref 0.1–0.9)
MONO%: 14.7 % — ABNORMAL HIGH (ref 0.0–14.0)
NEUT#: 2 10*3/uL (ref 1.5–6.5)
NEUT%: 53.8 % (ref 38.4–76.8)
Platelets: 274 10*3/uL (ref 145–400)
RBC: 4.01 10*6/uL (ref 3.70–5.45)
RDW: 18.5 % — ABNORMAL HIGH (ref 11.2–14.5)
WBC: 3.7 10*3/uL — ABNORMAL LOW (ref 3.9–10.3)
lymph#: 1.1 10*3/uL (ref 0.9–3.3)

## 2014-02-28 LAB — COMPREHENSIVE METABOLIC PANEL (CC13)
ALT: 24 U/L (ref 0–55)
AST: 16 U/L (ref 5–34)
Albumin: 3.2 g/dL — ABNORMAL LOW (ref 3.5–5.0)
Alkaline Phosphatase: 72 U/L (ref 40–150)
Anion Gap: 8 mEq/L (ref 3–11)
BUN: 9.7 mg/dL (ref 7.0–26.0)
CO2: 25 mEq/L (ref 22–29)
Calcium: 8.8 mg/dL (ref 8.4–10.4)
Chloride: 108 mEq/L (ref 98–109)
Creatinine: 1 mg/dL (ref 0.6–1.1)
Glucose: 115 mg/dl (ref 70–140)
Potassium: 3.4 mEq/L — ABNORMAL LOW (ref 3.5–5.1)
Sodium: 141 mEq/L (ref 136–145)
Total Bilirubin: 0.25 mg/dL (ref 0.20–1.20)
Total Protein: 6.1 g/dL — ABNORMAL LOW (ref 6.4–8.3)

## 2014-02-28 LAB — LACTATE DEHYDROGENASE (CC13): LDH: 244 U/L (ref 125–245)

## 2014-02-28 MED ORDER — INV-BORTEZOMIB CHEMO SQ INJECTION 3.5 MG CTSU E1A11
1.0000 mg/m2 | Freq: Once | INTRAVENOUS | Status: AC
  Administered 2014-02-28: 2 mg via SUBCUTANEOUS
  Filled 2014-02-28: qty 2

## 2014-02-28 MED ORDER — ONDANSETRON HCL 8 MG PO TABS
8.0000 mg | ORAL_TABLET | Freq: Once | ORAL | Status: AC
  Administered 2014-02-28: 8 mg via ORAL

## 2014-02-28 MED ORDER — ONDANSETRON HCL 8 MG PO TABS
ORAL_TABLET | ORAL | Status: AC
  Filled 2014-02-28: qty 1

## 2014-02-28 NOTE — Telephone Encounter (Signed)
RECEIVED A FAX FROM [facility] CONCERNING A CONFIRMATION OF PRESCRIPTION SHIPMENT FOR REVLIMID ON 02/27/14. 

## 2014-02-28 NOTE — Telephone Encounter (Signed)
gv adn printed aptp sched and avs for pt for April....sed added tdx.

## 2014-02-28 NOTE — Patient Instructions (Signed)
[facility] Discharge Instructions for Patients Receiving Chemotherapy  Today you received the following chemotherapy agents: Velcade.  To help prevent nausea and vomiting after your treatment, we encourage you to take your nausea medication as prescribed.   If you develop nausea and vomiting that is not controlled by your nausea medication, call the clinic.   BELOW ARE SYMPTOMS THAT SHOULD BE REPORTED IMMEDIATELY:  *FEVER GREATER THAN 100.5 F  *CHILLS WITH OR WITHOUT FEVER  NAUSEA AND VOMITING THAT IS NOT CONTROLLED WITH YOUR NAUSEA MEDICATION  *UNUSUAL SHORTNESS OF BREATH  *UNUSUAL BRUISING OR BLEEDING  TENDERNESS IN MOUTH AND THROAT WITH OR WITHOUT PRESENCE OF ULCERS  *URINARY PROBLEMS  *BOWEL PROBLEMS  UNUSUAL RASH Items with * indicate a potential emergency and should be followed up as soon as possible.  Feel free to call the clinic you have any questions or concerns. The clinic phone number is [phone].    

## 2014-02-28 NOTE — Progress Notes (Signed)
02/28/2014 Patient in to clinic today for evaluation prior to receiving treatment Cycle 5. Quality of Life assessments were completed independently by the patient upon arrival. Bone marrow biopsy results reviewed with patient by Dr. Alvy Bimler. At present, patient meets criteria for continued treatment. Patient returned completed cycle 4 Patient Medication Calendar, confirming correct dosing as prescribed. Blank cycle 5 Patient Medication Calendar was given to patient today for completion. Reinforced reduced dosage of dexamethasone beginning with this cycle, at half the previous dose. Patient verbalized understanding. Sign for infusion given to Etta Grandchild RN. [name] BSN, RN, CCRP 02/28/2014 1:25 PM

## 2014-02-28 NOTE — Progress Notes (Signed)
[facility] OFFICE PROGRESS NOTE  Patient Care Team: Wenda Low, MD as PCP - General (Internal Medicine) Heath Lark, MD as Consulting Physician (Hematology and Oncology)  DIAGNOSIS: IgG lambda multiple myeloma, very good partial response  SUMMARY OF ONCOLOGIC HISTORY: Oncology History   ISS stage 1 IgG lambda subtype (serum albumin 3.6, Beta2 microglobulin 2.32) Durie Salmon Stage 1     Multiple myeloma, without mention of having achieved remission   10/10/2013 Imaging Skeletal survery was negative   11/09/2013 Bone Marrow Biopsy BM biopsy confirmed myeloma, 76% involved, IgG lambda subtype   12/06/2013 -  Chemotherapy Start cycle 1 of chemo with revlimid, Velcade, Dexamethasone and Zometa. Patient particpated in clinical research [facility] [phone]   02/23/2014 Bone Marrow Biopsy Repeat bone marrow biopsy showed 5% involvement    INTERVAL HISTORY: Carla Perry 58 y.o. female returns for further followup. She is doing very well. Her leg is no longer swollen. She denies any sensitivity to light. Denies any peripheral neuropathy. The patient denies any recent signs or symptoms of bleeding such as spontaneous epistaxis, hematuria or hematochezia. I have reviewed the past medical history, past surgical history, social history and family history with the patient and they are unchanged from previous note.  ALLERGIES:  has No Known Allergies.  MEDICATIONS:  Current Outpatient Prescriptions  Medication Sig Dispense Refill  . acyclovir (ZOVIRAX) 400 MG tablet Take 400 mg by mouth 2 (two) times daily.      Marland Kitchen aspirin EC 325 MG tablet Take 325 mg by mouth daily.      . calcium carbonate (TUMS - DOSED IN MG ELEMENTAL CALCIUM) 500 MG chewable tablet Chew 1 tablet by mouth as needed for indigestion or heartburn.      . Cholecalciferol (VITAMIN D3) 2000 UNITS TABS Take 2,000 Units by mouth daily.      Marland Kitchen dexamethasone (DECADRON) 4 MG tablet Take 1 tablet (4 mg total) by mouth as  directed. Take 10 tablets every week  60 tablet  1  . hydrochlorothiazide (HYDRODIURIL) 25 MG tablet Take 25 mg by mouth daily with breakfast.       . lenalidomide (REVLIMID) 15 MG capsule Take 15 mg by mouth daily.      Marland Kitchen levothyroxine (SYNTHROID, LEVOTHROID) 75 MCG tablet Take 75 mcg by mouth daily before breakfast.      . lidocaine-prilocaine (EMLA) cream Apply 1 application topically as needed (port).      . Multiple Vitamins-Minerals (CENTRUM SILVER PO) Take 1 tablet by mouth daily.       . polyethylene glycol (MIRALAX / GLYCOLAX) packet Take 17 g by mouth daily.      . prochlorperazine (COMPAZINE) 10 MG tablet Take 1 tablet (10 mg total) by mouth every 6 (six) hours as needed (Nausea or vomiting).  30 tablet  1  . Rivaroxaban (XARELTO) 15 MG TABS tablet Take 1 tablet (15 mg total) by mouth 2 (two) times daily with a meal.  42 tablet  0  . Rivaroxaban (XARELTO) 20 MG TABS tablet Take 1 tablet (20 mg total) by mouth daily with supper.  30 tablet  6  . sulfamethoxazole-trimethoprim (BACTRIM DS,SEPTRA DS) 800-160 MG per tablet Take 1 tablet by mouth as directed. 1 tab twice a day on Mondays, Wednesday and Friday  60 tablet  1  . traMADol (ULTRAM) 50 MG tablet Take 1 tablet (50 mg total) by mouth every 6 (six) hours as needed for moderate pain or severe pain.  60 tablet  0  .  venlafaxine XR (EFFEXOR-XR) 75 MG 24 hr capsule Take 1 capsule (75 mg total) by mouth 2 (two) times daily.  10 capsule  1  . verapamil (VERELAN PM) 120 MG 24 hr capsule Take 120 mg by mouth at bedtime.       No current facility-administered medications for this visit.    REVIEW OF SYSTEMS:   Constitutional: Denies fevers, chills or abnormal weight loss Eyes: Denies blurriness of vision Ears, nose, mouth, throat, and face: Denies mucositis or sore throat Respiratory: Denies cough, dyspnea or wheezes Cardiovascular: Denies palpitation, chest discomfort or lower extremity swelling Gastrointestinal:  Denies nausea,  heartburn or change in bowel habits Skin: Denies abnormal skin rashes Lymphatics: Denies new lymphadenopathy or easy bruising Neurological:Denies numbness, tingling or new weaknesses Behavioral/Psych: Mood is stable, no new changes  All other systems were reviewed with the patient and are negative.  PHYSICAL EXAMINATION: ECOG PERFORMANCE STATUS: 0 - Asymptomatic  Filed Vitals:   02/28/14 1245  BP: 127/68  Pulse: 80  Temp: 98 F (36.7 C)  Resp: 18   Filed Weights   02/28/14 1245  Weight: 185 lb 12.8 oz (84.278 kg)    GENERAL:alert, no distress and comfortable. She is mildly cushingoid SKIN: skin color, texture, turgor are normal, no rashes or significant lesions EYES: normal, Conjunctiva are pink and non-injected, sclera clear OROPHARYNX:no exudate, no erythema and lips, buccal mucosa, and tongue normal  NECK: supple, thyroid normal size, non-tender, without nodularity LYMPH:  no palpable lymphadenopathy in the cervical, axillary or inguinal LUNGS: clear to auscultation and percussion with normal breathing effort HEART: regular rate & rhythm and no murmurs and no lower extremity edema ABDOMEN:abdomen soft, non-tender and normal bowel sounds Musculoskeletal:no cyanosis of digits and no clubbing  NEURO: alert & oriented x 3 with fluent speech, no focal motor/sensory deficits  LABORATORY DATA:  I have reviewed the data as listed    Component Value Date/Time   NA 141 02/28/2014 1230   K 3.4* 02/28/2014 1230   CO2 25 02/28/2014 1230   GLUCOSE 115 02/28/2014 1230   BUN 9.7 02/28/2014 1230   CREATININE 1.0 02/28/2014 1230   CALCIUM 8.8 02/28/2014 1230   PROT 6.1* 02/28/2014 1230   ALBUMIN 3.2* 02/28/2014 1230   AST 16 02/28/2014 1230   ALT 24 02/28/2014 1230   ALKPHOS 72 02/28/2014 1230   BILITOT 0.25 02/28/2014 1230    No results found for this basename: SPEP,  UPEP,   kappa and lambda light chains    Lab Results  Component Value Date   WBC 3.7* 02/28/2014   NEUTROABS 2.0  02/28/2014   HGB 9.6* 02/28/2014   HCT 30.8* 02/28/2014   MCV 76.9* 02/28/2014   PLT 274 02/28/2014      Chemistry      Component Value Date/Time   NA 141 02/28/2014 1230   K 3.4* 02/28/2014 1230   CO2 25 02/28/2014 1230   BUN 9.7 02/28/2014 1230   CREATININE 1.0 02/28/2014 1230      Component Value Date/Time   CALCIUM 8.8 02/28/2014 1230   ALKPHOS 72 02/28/2014 1230   AST 16 02/28/2014 1230   ALT 24 02/28/2014 1230   BILITOT 0.25 02/28/2014 1230     ASSESSMENT & PLAN:  #1 multiple myeloma She had achieved very good partial response. We will continue the protocol as outlined in her research study. #2 right lower extremity DVT This is related to side effects of treatment. She is doing very well with current anticoagulation therapy with  no bleeding complications. #3 mild cushingoid syndrome with hyperglycemia I suspect this is due to dexamethasone. Her research protocol, we will reduce the dexamethasone to 10 mg. #4 leukopenia This is likely due to recent treatment. The patient denies recent history of fevers, cough, chills, diarrhea or dysuria. She is asymptomatic from the leukopenia. I will observe for now.  I will continue the chemotherapy at current dose without dosage adjustment.  If the leukopenia gets progressive worse in the future, I might have to delay her treatment or adjust the chemotherapy dose. #5 antimicrobial prophylaxis I also recommend supportive care with prophylactic antibiotic coverage with Bactrim as well as acyclovir #6 bone health She will continue calcium, vitamin D and monthly Zometa #7 anemia Was related to her underlying disease. It is improving. She is not symptomatic. #8 mild hypokalemia The patient is not symptomatic. Recommend observation only.  All questions were answered. The patient knows to call the clinic with any problems, questions or concerns. No barriers to learning was detected. I spent 25 minutes counseling the patient face to face. The total time  spent in the appointment was 30 minutes and more than 50% was on counseling and review of test results     [facility], [place], MD 02/28/2014 4:43 PM

## 2014-03-01 ENCOUNTER — Telehealth: Payer: Self-pay | Admitting: *Deleted

## 2014-03-01 NOTE — Telephone Encounter (Signed)
Per staff message from research RN I have chagned 4/10 appt

## 2014-03-03 ENCOUNTER — Ambulatory Visit (HOSPITAL_BASED_OUTPATIENT_CLINIC_OR_DEPARTMENT_OTHER): Payer: 59

## 2014-03-03 ENCOUNTER — Encounter: Payer: Self-pay | Admitting: *Deleted

## 2014-03-03 VITALS — BP 124/73 | HR 83 | Temp 98.2°F | Resp 18

## 2014-03-03 DIAGNOSIS — C9000 Multiple myeloma not having achieved remission: Secondary | ICD-10-CM

## 2014-03-03 DIAGNOSIS — Z5112 Encounter for antineoplastic immunotherapy: Secondary | ICD-10-CM

## 2014-03-03 LAB — SPEP & IFE WITH QIG
Albumin ELP: 62.7 % (ref 55.8–66.1)
Alpha-1-Globulin: 5.2 % — ABNORMAL HIGH (ref 2.9–4.9)
Alpha-2-Globulin: 11.1 % (ref 7.1–11.8)
Beta 2: 3.4 % (ref 3.2–6.5)
Beta Globulin: 6.8 % (ref 4.7–7.2)
Gamma Globulin: 10.8 % — ABNORMAL LOW (ref 11.1–18.8)
IgA: 13 mg/dL — ABNORMAL LOW (ref 69–380)
IgG (Immunoglobin G), Serum: 717 mg/dL (ref 690–1700)
IgM, Serum: 37 mg/dL — ABNORMAL LOW (ref 52–322)
M-Spike, %: 0.31 g/dL
Total Protein, Serum Electrophoresis: 5.7 g/dL — ABNORMAL LOW (ref 6.0–8.3)

## 2014-03-03 LAB — TISSUE HYBRIDIZATION (BONE MARROW)-NCBH

## 2014-03-03 LAB — KAPPA/LAMBDA LIGHT CHAINS
Kappa free light chain: 0.83 mg/dL (ref 0.33–1.94)
Kappa:Lambda Ratio: 0.43 (ref 0.26–1.65)
Lambda Free Lght Chn: 1.94 mg/dL (ref 0.57–2.63)

## 2014-03-03 LAB — CHROMOSOME ANALYSIS, BONE MARROW

## 2014-03-03 MED ORDER — INV-BORTEZOMIB CHEMO SQ INJECTION 3.5 MG CTSU E1A11
1.0000 mg/m2 | Freq: Once | INTRAVENOUS | Status: AC
  Administered 2014-03-03: 2 mg via SUBCUTANEOUS
  Filled 2014-03-03: qty 2

## 2014-03-03 MED ORDER — ONDANSETRON HCL 8 MG PO TABS
8.0000 mg | ORAL_TABLET | Freq: Once | ORAL | Status: AC
  Administered 2014-03-03: 8 mg via ORAL

## 2014-03-03 MED ORDER — ONDANSETRON HCL 8 MG PO TABS
ORAL_TABLET | ORAL | Status: AC
  Filled 2014-03-03: qty 1

## 2014-03-03 NOTE — Progress Notes (Signed)
03/03/2014 Patient in to clinic today for Day 4 treatment. Sign for injection given to Cindi Carbon, RN. [name] BSN, RN, [place] 03/03/2014 12:57 PM

## 2014-03-06 ENCOUNTER — Other Ambulatory Visit: Payer: Self-pay | Admitting: Hematology and Oncology

## 2014-03-07 ENCOUNTER — Ambulatory Visit (HOSPITAL_BASED_OUTPATIENT_CLINIC_OR_DEPARTMENT_OTHER): Payer: 59

## 2014-03-07 ENCOUNTER — Encounter: Payer: Self-pay | Admitting: *Deleted

## 2014-03-07 ENCOUNTER — Other Ambulatory Visit (HOSPITAL_BASED_OUTPATIENT_CLINIC_OR_DEPARTMENT_OTHER): Payer: 59

## 2014-03-07 ENCOUNTER — Other Ambulatory Visit: Payer: Self-pay

## 2014-03-07 VITALS — BP 114/69 | HR 81 | Temp 97.5°F | Resp 18

## 2014-03-07 DIAGNOSIS — C9000 Multiple myeloma not having achieved remission: Secondary | ICD-10-CM

## 2014-03-07 DIAGNOSIS — Z5112 Encounter for antineoplastic immunotherapy: Secondary | ICD-10-CM

## 2014-03-07 LAB — CBC WITH DIFFERENTIAL/PLATELET
BASO%: 0.2 % (ref 0.0–2.0)
Basophils Absolute: 0 10*3/uL (ref 0.0–0.1)
EOS%: 1 % (ref 0.0–7.0)
Eosinophils Absolute: 0.1 10*3/uL (ref 0.0–0.5)
HCT: 31.9 % — ABNORMAL LOW (ref 34.8–46.6)
HGB: 9.9 g/dL — ABNORMAL LOW (ref 11.6–15.9)
LYMPH%: 11.1 % — ABNORMAL LOW (ref 14.0–49.7)
MCH: 23.9 pg — ABNORMAL LOW (ref 25.1–34.0)
MCHC: 31 g/dL — ABNORMAL LOW (ref 31.5–36.0)
MCV: 77.1 fL — ABNORMAL LOW (ref 79.5–101.0)
MONO#: 0.1 10*3/uL (ref 0.1–0.9)
MONO%: 2.7 % (ref 0.0–14.0)
NEUT#: 4.1 10*3/uL (ref 1.5–6.5)
NEUT%: 85 % — ABNORMAL HIGH (ref 38.4–76.8)
Platelets: 197 10*3/uL (ref 145–400)
RBC: 4.14 10*6/uL (ref 3.70–5.45)
RDW: 17.6 % — ABNORMAL HIGH (ref 11.2–14.5)
WBC: 4.8 10*3/uL (ref 3.9–10.3)
lymph#: 0.5 10*3/uL — ABNORMAL LOW (ref 0.9–3.3)

## 2014-03-07 MED ORDER — ONDANSETRON HCL 8 MG PO TABS
8.0000 mg | ORAL_TABLET | Freq: Once | ORAL | Status: AC
  Administered 2014-03-07: 8 mg via ORAL

## 2014-03-07 MED ORDER — SODIUM CHLORIDE 0.9 % IJ SOLN
10.0000 mL | INTRAMUSCULAR | Status: DC | PRN
  Filled 2014-03-07: qty 10

## 2014-03-07 MED ORDER — HEPARIN SOD (PORK) LOCK FLUSH 100 UNIT/ML IV SOLN
500.0000 [IU] | Freq: Once | INTRAVENOUS | Status: AC
  Administered 2014-03-07: 500 [IU] via INTRAVENOUS
  Filled 2014-03-07: qty 5

## 2014-03-07 MED ORDER — SODIUM CHLORIDE 0.9 % IV SOLN
INTRAVENOUS | Status: DC
  Administered 2014-03-07: 15:00:00 via INTRAVENOUS

## 2014-03-07 MED ORDER — SODIUM CHLORIDE 0.9 % IJ SOLN
10.0000 mL | INTRAMUSCULAR | Status: DC | PRN
  Administered 2014-03-07: 10 mL via INTRAVENOUS
  Filled 2014-03-07: qty 10

## 2014-03-07 MED ORDER — HEPARIN SOD (PORK) LOCK FLUSH 100 UNIT/ML IV SOLN
250.0000 [IU] | Freq: Once | INTRAVENOUS | Status: DC | PRN
  Filled 2014-03-07: qty 5

## 2014-03-07 MED ORDER — INV-BORTEZOMIB CHEMO SQ INJECTION 3.5 MG CTSU E1A11
1.0000 mg/m2 | Freq: Once | INTRAVENOUS | Status: AC
  Administered 2014-03-07: 2 mg via SUBCUTANEOUS
  Filled 2014-03-07: qty 2

## 2014-03-07 MED ORDER — ZOLEDRONIC ACID 4 MG/100ML IV SOLN
4.0000 mg | Freq: Once | INTRAVENOUS | Status: AC
  Administered 2014-03-07: 4 mg via INTRAVENOUS
  Filled 2014-03-07: qty 100

## 2014-03-07 MED ORDER — ONDANSETRON HCL 8 MG PO TABS
ORAL_TABLET | ORAL | Status: AC
  Filled 2014-03-07: qty 1

## 2014-03-07 NOTE — Progress Notes (Signed)
Harvey stated she took her decadron today at home prior to coming to chemotherapy.

## 2014-03-07 NOTE — Progress Notes (Signed)
03/07/2014 Patient in to clinic today for Day 8 treatment. Lab results within parameters for continued treatment. Sign for injection given to Abelina Bachelor RN. [name] BSN, RN, [place] 03/07/2014 3:52 PM

## 2014-03-07 NOTE — Patient Instructions (Signed)
[facility] Discharge Instructions for Patients Receiving Chemotherapy  Today you received the following chemotherapy agents: Velcade.  To help prevent nausea and vomiting after your treatment, we encourage you to take your nausea medication as prescribed.   If you develop nausea and vomiting that is not controlled by your nausea medication, call the clinic.   BELOW ARE SYMPTOMS THAT SHOULD BE REPORTED IMMEDIATELY:  *FEVER GREATER THAN 100.5 F  *CHILLS WITH OR WITHOUT FEVER  NAUSEA AND VOMITING THAT IS NOT CONTROLLED WITH YOUR NAUSEA MEDICATION  *UNUSUAL SHORTNESS OF BREATH  *UNUSUAL BRUISING OR BLEEDING  TENDERNESS IN MOUTH AND THROAT WITH OR WITHOUT PRESENCE OF ULCERS  *URINARY PROBLEMS  *BOWEL PROBLEMS  UNUSUAL RASH Items with * indicate a potential emergency and should be followed up as soon as possible.  Feel free to call the clinic you have any questions or concerns. The clinic phone number is [phone].    

## 2014-03-09 ENCOUNTER — Encounter (HOSPITAL_COMMUNITY): Payer: Self-pay

## 2014-03-10 ENCOUNTER — Ambulatory Visit (HOSPITAL_BASED_OUTPATIENT_CLINIC_OR_DEPARTMENT_OTHER): Payer: 59

## 2014-03-10 ENCOUNTER — Encounter: Payer: Self-pay | Admitting: *Deleted

## 2014-03-10 VITALS — BP 119/71 | HR 90 | Temp 97.8°F | Resp 18

## 2014-03-10 DIAGNOSIS — Z5112 Encounter for antineoplastic immunotherapy: Secondary | ICD-10-CM

## 2014-03-10 DIAGNOSIS — C9000 Multiple myeloma not having achieved remission: Secondary | ICD-10-CM

## 2014-03-10 MED ORDER — ONDANSETRON HCL 8 MG PO TABS
8.0000 mg | ORAL_TABLET | Freq: Once | ORAL | Status: AC
  Administered 2014-03-10: 8 mg via ORAL

## 2014-03-10 MED ORDER — INV-BORTEZOMIB CHEMO SQ INJECTION 3.5 MG CTSU E1A11
1.0000 mg/m2 | Freq: Once | INTRAVENOUS | Status: AC
  Administered 2014-03-10: 2 mg via SUBCUTANEOUS
  Filled 2014-03-10: qty 2

## 2014-03-10 MED ORDER — ONDANSETRON HCL 8 MG PO TABS
ORAL_TABLET | ORAL | Status: AC
  Filled 2014-03-10: qty 1

## 2014-03-10 NOTE — Patient Instructions (Signed)
[facility] Discharge Instructions for Patients Receiving Chemotherapy  Today you received the following chemotherapy agents: Velcade. To help prevent nausea and vomiting after your treatment, we encourage you to take your nausea medication.  If you develop nausea and vomiting that is not controlled by your nausea medication, call the clinic.   BELOW ARE SYMPTOMS THAT SHOULD BE REPORTED IMMEDIATELY:  *FEVER GREATER THAN 100.5 F  *CHILLS WITH OR WITHOUT FEVER  NAUSEA AND VOMITING THAT IS NOT CONTROLLED WITH YOUR NAUSEA MEDICATION  *UNUSUAL SHORTNESS OF BREATH  *UNUSUAL BRUISING OR BLEEDING  TENDERNESS IN MOUTH AND THROAT WITH OR WITHOUT PRESENCE OF ULCERS  *URINARY PROBLEMS  *BOWEL PROBLEMS  UNUSUAL RASH Items with * indicate a potential emergency and should be followed up as soon as possible.  Feel free to call the clinic you have any questions or concerns. The clinic phone number is [phone].    

## 2014-03-10 NOTE — Progress Notes (Signed)
03/10/2014 Patient in to clinic today for Day 11 treatment. Sign for injection given to Jaci Carrel RN. [name] BSN, RN, [facility] 03/10/2014 2:37 PM

## 2014-03-13 ENCOUNTER — Other Ambulatory Visit: Payer: Self-pay | Admitting: *Deleted

## 2014-03-13 MED ORDER — LENALIDOMIDE 15 MG PO CAPS: 15.0000 mg | ORAL_CAPSULE | Freq: Every day | ORAL | Status: DC

## 2014-03-13 NOTE — Telephone Encounter (Signed)
THIS REFILL REQUEST FOR REVLIMID WAS PLACED IN DR.GORSUCH'S ACTIVE WORK FOLDER. 

## 2014-03-14 NOTE — Telephone Encounter (Signed)
RECEIVED A FAX FROM [employer] CONCERNING A CONFIRMATION OF FACSIMILE RECEIPT FOR PT. REFERRAL. 

## 2014-03-17 ENCOUNTER — Telehealth: Payer: Self-pay | Admitting: *Deleted

## 2014-03-17 MED ORDER — CIPROFLOXACIN HCL 500 MG PO TABS: 500.0000 mg | ORAL_TABLET | Freq: Two times a day (BID) | ORAL | Status: DC

## 2014-03-17 NOTE — Telephone Encounter (Signed)
Pt left VM states she feels like she is getting a bladder infection.  She asks if we can check her urine on appt next week?  Attempted to call pt back on cell phone number she left x 2 and there is no answer and no VM avail to leave message.  Called home number and left VM informing her of antibiotic ordered by Dr. Alvy Bimler and sent to [facility] on [address].  Asked her to call nurse back today if able or call us on Monday morning.

## 2014-03-20 ENCOUNTER — Telehealth: Payer: Self-pay | Admitting: *Deleted

## 2014-03-20 NOTE — Telephone Encounter (Signed)
Called pt to make sure she got message about starting on Cipro this past Friday.  She states she did and has started Cipro and feels better. She will keep her appt w/ [place] tomorrow as scheduled.  Received request for updated records from pt's disability insurance company, [employer]. Gave request to [employer] in [place].

## 2014-03-20 NOTE — Telephone Encounter (Signed)
RECEIVED A FAX FROM [employer] [facility]CERNING A CONFIRMATION OF PRESCRIPTION SHIPMENT FOR REVLIMID ON 03/17/14.

## 2014-03-21 ENCOUNTER — Encounter: Payer: 59 | Admitting: *Deleted

## 2014-03-21 ENCOUNTER — Encounter: Payer: Self-pay | Admitting: *Deleted

## 2014-03-21 ENCOUNTER — Telehealth: Payer: Self-pay | Admitting: Hematology and Oncology

## 2014-03-21 ENCOUNTER — Telehealth: Payer: Self-pay | Admitting: *Deleted

## 2014-03-21 ENCOUNTER — Other Ambulatory Visit (HOSPITAL_BASED_OUTPATIENT_CLINIC_OR_DEPARTMENT_OTHER): Payer: 59

## 2014-03-21 ENCOUNTER — Ambulatory Visit (HOSPITAL_BASED_OUTPATIENT_CLINIC_OR_DEPARTMENT_OTHER): Payer: 59 | Admitting: Hematology and Oncology

## 2014-03-21 ENCOUNTER — Ambulatory Visit (HOSPITAL_BASED_OUTPATIENT_CLINIC_OR_DEPARTMENT_OTHER): Payer: 59

## 2014-03-21 VITALS — BP 130/74 | HR 87 | Temp 97.7°F | Resp 20 | Ht 67.0 in | Wt 191.2 lb

## 2014-03-21 DIAGNOSIS — R6884 Jaw pain: Secondary | ICD-10-CM

## 2014-03-21 DIAGNOSIS — C9000 Multiple myeloma not having achieved remission: Secondary | ICD-10-CM

## 2014-03-21 DIAGNOSIS — Z5112 Encounter for antineoplastic immunotherapy: Secondary | ICD-10-CM

## 2014-03-21 DIAGNOSIS — I82409 Acute embolism and thrombosis of unspecified deep veins of unspecified lower extremity: Secondary | ICD-10-CM

## 2014-03-21 DIAGNOSIS — D649 Anemia, unspecified: Secondary | ICD-10-CM

## 2014-03-21 DIAGNOSIS — D6481 Anemia due to antineoplastic chemotherapy: Secondary | ICD-10-CM

## 2014-03-21 DIAGNOSIS — T451X5A Adverse effect of antineoplastic and immunosuppressive drugs, initial encounter: Secondary | ICD-10-CM

## 2014-03-21 DIAGNOSIS — Z7901 Long term (current) use of anticoagulants: Secondary | ICD-10-CM

## 2014-03-21 LAB — CBC WITH DIFFERENTIAL/PLATELET
BASO%: 0.6 % (ref 0.0–2.0)
Basophils Absolute: 0 10*3/uL (ref 0.0–0.1)
EOS%: 1.6 % (ref 0.0–7.0)
Eosinophils Absolute: 0.1 10*3/uL (ref 0.0–0.5)
HCT: 33.5 % — ABNORMAL LOW (ref 34.8–46.6)
HGB: 10.2 g/dL — ABNORMAL LOW (ref 11.6–15.9)
LYMPH%: 21.4 % (ref 14.0–49.7)
MCH: 23.9 pg — ABNORMAL LOW (ref 25.1–34.0)
MCHC: 30.4 g/dL — ABNORMAL LOW (ref 31.5–36.0)
MCV: 78.6 fL — ABNORMAL LOW (ref 79.5–101.0)
MONO#: 0.5 10*3/uL (ref 0.1–0.9)
MONO%: 11 % (ref 0.0–14.0)
NEUT#: 3.2 10*3/uL (ref 1.5–6.5)
NEUT%: 65.4 % (ref 38.4–76.8)
Platelets: 275 10*3/uL (ref 145–400)
RBC: 4.26 10*6/uL (ref 3.70–5.45)
RDW: 17.3 % — ABNORMAL HIGH (ref 11.2–14.5)
WBC: 4.9 10*3/uL (ref 3.9–10.3)
lymph#: 1.1 10*3/uL (ref 0.9–3.3)

## 2014-03-21 LAB — COMPREHENSIVE METABOLIC PANEL (CC13)
ALT: 25 U/L (ref 0–55)
AST: 18 U/L (ref 5–34)
Albumin: 3.4 g/dL — ABNORMAL LOW (ref 3.5–5.0)
Alkaline Phosphatase: 77 U/L (ref 40–150)
Anion Gap: 12 mEq/L — ABNORMAL HIGH (ref 3–11)
BUN: 9.3 mg/dL (ref 7.0–26.0)
CO2: 23 mEq/L (ref 22–29)
Calcium: 9.9 mg/dL (ref 8.4–10.4)
Chloride: 106 mEq/L (ref 98–109)
Creatinine: 1 mg/dL (ref 0.6–1.1)
Glucose: 171 mg/dl — ABNORMAL HIGH (ref 70–140)
Potassium: 3.6 mEq/L (ref 3.5–5.1)
Sodium: 142 mEq/L (ref 136–145)
Total Bilirubin: 0.26 mg/dL (ref 0.20–1.20)
Total Protein: 6.7 g/dL (ref 6.4–8.3)

## 2014-03-21 LAB — LACTATE DEHYDROGENASE (CC13): LDH: 282 U/L — ABNORMAL HIGH (ref 125–245)

## 2014-03-21 MED ORDER — ONDANSETRON HCL 8 MG PO TABS
ORAL_TABLET | ORAL | Status: AC
Start: 2014-03-21 — End: 2014-03-21
  Filled 2014-03-21: qty 1

## 2014-03-21 MED ORDER — ACYCLOVIR 400 MG PO TABS: 400.0000 mg | ORAL_TABLET | Freq: Two times a day (BID) | ORAL | Status: DC

## 2014-03-21 MED ORDER — ONDANSETRON HCL 8 MG PO TABS
8.0000 mg | ORAL_TABLET | Freq: Once | ORAL | Status: AC
  Administered 2014-03-21: 8 mg via ORAL

## 2014-03-21 MED ORDER — INV-BORTEZOMIB CHEMO SQ INJECTION 3.5 MG CTSU E1A11
1.0000 mg/m2 | Freq: Once | INTRAVENOUS | Status: AC
  Administered 2014-03-21: 2 mg via SUBCUTANEOUS
  Filled 2014-03-21: qty 2

## 2014-03-21 NOTE — Telephone Encounter (Signed)
Hold Xarelto for 5 days prior to 4/30, meaning the last day she takes on 4/24. Ok to resume Xarelto the next morning

## 2014-03-21 NOTE — Progress Notes (Signed)
Instructed pt to stop taking aspirin per Dr. Alvy Bimler since she is taking xarelto.  Pt verbalized understanding to stop the aspirin.

## 2014-03-21 NOTE — Telephone Encounter (Signed)
Instructed pt on holding Xarelto for 5 days before root canal.  Take it on 4/24, then hold 4/25 thru 4/30. May resume day after procedure on 5/01.  She verbalized understanding.

## 2014-03-21 NOTE — Telephone Encounter (Signed)
Per staff message and POF I have scheduled appts.  JMW  

## 2014-03-21 NOTE — Telephone Encounter (Signed)
Pt states she is scheduled for Root Canal for Thursday 4/30 and asks for instructions regarding Xarelto.

## 2014-03-21 NOTE — Progress Notes (Signed)
03/21/2014 Patient in to clinic today for evaluation prior to receiving treatment Cycle 6. Myeloma lab results from previous cycle were reviewed with the patient by Dr. Alvy Bimler. Plans for completion of 12 cycles, followed by randomization to maintenance, if disease is stable and patient consents, were discussed today. Patient is in agreement with plans. Based on lab results review and history and physical by Dr. Alvy Bimler, patient condition is acceptable for continued treatment today. Sign for injection given to Carla Bachelor RN. Patient will have dental evaluation prior to next administration of Zometa, due in two weeks, due to onset of tooth pain on the right side of her mouth. She denies any eye pain this cycle though she does still have sensitivity to light and is scheduled for further opthalmic evaluation in two weeks. Patient's primary complaint is fatigue. She also noted a couple of days of back pain, not interfering with function, but requiring pain medication. Patient returned completed cycle 5 Patient Medication Calendar, confirming correct dosing as prescribed. Blank cycle 6 Patient Medication Calendar was given to patient today for completion. Carla Perry BSN, RN, CCRP 03/21/2014 9:13 AM

## 2014-03-21 NOTE — Progress Notes (Signed)
[facility] OFFICE PROGRESS NOTE  Patient Care Team: Wenda Low, MD as PCP - General (Internal Medicine) Heath Lark, MD as Consulting Physician (Hematology and Oncology)  DIAGNOSIS: IgG lambda multiple myeloma  SUMMARY OF ONCOLOGIC HISTORY: Oncology History   ISS stage 1 IgG lambda subtype (serum albumin 3.6, Beta2 microglobulin 2.32) Durie Salmon Stage 1     Multiple myeloma, without mention of having achieved remission   10/10/2013 Imaging Skeletal survery was negative   11/09/2013 Bone Marrow Biopsy BM biopsy confirmed myeloma, 76% involved, IgG lambda subtype   12/06/2013 -  Chemotherapy Start cycle 1 of chemo with revlimid, Velcade, Dexamethasone and Zometa. Patient particpated in clinical research [facility] [phone]   02/23/2014 Bone Marrow Biopsy Repeat bone marrow biopsy showed 5% involvement    INTERVAL HISTORY: Carla Perry 58 y.o. female returns for further followup. Last week, she complained of symptoms of urinary tract infection. He has almost completely resolved. She was treated with ciprofloxacin. Her only main complaint is some swelling in the right lower jaw. She denies any gum sensitivity of the pain. Denies any fevers or chills. She remained on anticoagulation therapy. The patient denies any recent signs or symptoms of bleeding such as spontaneous epistaxis, hematuria or hematochezia.  I have reviewed the past medical history, past surgical history, social history and family history with the patient and they are unchanged from previous note.  ALLERGIES:  has No Known Allergies.  MEDICATIONS:  Current Outpatient Prescriptions  Medication Sig Dispense Refill  . acyclovir (ZOVIRAX) 400 MG tablet Take 1 tablet (400 mg total) by mouth 2 (two) times daily.  60 tablet  6  . aspirin EC 325 MG tablet Take 325 mg by mouth daily.      . calcium carbonate (TUMS - DOSED IN MG ELEMENTAL CALCIUM) 500 MG chewable tablet Chew 1 tablet by mouth as needed for  indigestion or heartburn.      . Cholecalciferol (VITAMIN D3) 2000 UNITS TABS Take 2,000 Units by mouth daily.      . ciprofloxacin (CIPRO) 500 MG tablet Take 1 tablet (500 mg total) by mouth 2 (two) times daily.  14 tablet  0  . dexamethasone (DECADRON) 4 MG tablet Take 1 tablet (4 mg total) by mouth as directed. Take 10 tablets every week  60 tablet  1  . hydrochlorothiazide (HYDRODIURIL) 25 MG tablet Take 25 mg by mouth daily with breakfast.       . lenalidomide (REVLIMID) 15 MG capsule Take 1 capsule (15 mg total) by mouth daily. X 14 days on and 7 days off.  14 capsule  0  . levothyroxine (SYNTHROID, LEVOTHROID) 75 MCG tablet Take 75 mcg by mouth daily before breakfast.      . lidocaine-prilocaine (EMLA) cream Apply 1 application topically as needed (port).      . Multiple Vitamins-Minerals (CENTRUM SILVER PO) Take 1 tablet by mouth daily.       . polyethylene glycol (MIRALAX / GLYCOLAX) packet Take 17 g by mouth daily.      . prochlorperazine (COMPAZINE) 10 MG tablet Take 1 tablet (10 mg total) by mouth every 6 (six) hours as needed (Nausea or vomiting).  30 tablet  1  . Rivaroxaban (XARELTO) 20 MG TABS tablet Take 1 tablet (20 mg total) by mouth daily with supper.  30 tablet  6  . sulfamethoxazole-trimethoprim (BACTRIM DS,SEPTRA DS) 800-160 MG per tablet Take 1 tablet by mouth as directed. 1 tab twice a day on Mondays, Wednesday and Friday  60 tablet  1  . traMADol (ULTRAM) 50 MG tablet Take 1 tablet (50 mg total) by mouth every 6 (six) hours as needed for moderate pain or severe pain.  60 tablet  0  . venlafaxine XR (EFFEXOR-XR) 75 MG 24 hr capsule Take 1 capsule (75 mg total) by mouth 2 (two) times daily.  10 capsule  1  . verapamil (VERELAN PM) 120 MG 24 hr capsule Take 120 mg by mouth at bedtime.       No current facility-administered medications for this visit.   Facility-Administered Medications Ordered in Other Visits  Medication Dose Route Frequency Provider Last Rate Last Dose  .  bortezomib SQ (VELCADE) chemo injection 2 mg  1 mg/m2 (Treatment Plan Actual) Subcutaneous Once Heath Lark, MD        REVIEW OF SYSTEMS:   Constitutional: Denies fevers, chills or abnormal weight loss Eyes: Denies blurriness of vision Ears, nose, mouth, throat, and face: Denies mucositis or sore throat Respiratory: Denies cough, dyspnea or wheezes Cardiovascular: Denies palpitation, chest discomfort or lower extremity swelling Gastrointestinal:  Denies nausea, heartburn or change in bowel habits Skin: Denies abnormal skin rashes Lymphatics: Denies new lymphadenopathy or easy bruising Neurological:Denies numbness, tingling or new weaknesses Behavioral/Psych: Mood is stable, no new changes  All other systems were reviewed with the patient and are negative.  PHYSICAL EXAMINATION: ECOG PERFORMANCE STATUS: 0 - Asymptomatic  Filed Vitals:   03/21/14 0832  BP: 130/74  Pulse: 87  Temp: 97.7 F (36.5 C)  Resp: 20   Filed Weights   03/21/14 0832  Weight: 191 lb 3.2 oz (86.728 kg)    GENERAL:alert, no distress and comfortable. she looked mildly cushingoid SKIN: skin color, texture, turgor are normal, no rashes or significant lesions EYES: normal, Conjunctiva are pink and non-injected, sclera clear OROPHARYNX:no exudate, no erythema and lips, buccal mucosa, and tongue normal . I do not appreciate any evidence to suspect osteonecrosis of the jaw NECK: supple, thyroid normal size, non-tender, without nodularity LYMPH:  no palpable lymphadenopathy in the cervical, axillary or inguinal LUNGS: clear to auscultation and percussion with normal breathing effort HEART: regular rate & rhythm and no murmurs and no lower extremity edema ABDOMEN:abdomen soft, non-tender and normal bowel sounds Musculoskeletal:no cyanosis of digits and no clubbing  NEURO: alert & oriented x 3 with fluent speech, no focal motor/sensory deficits  LABORATORY DATA:  I have reviewed the data as listed    Component  Value Date/Time   NA 142 03/21/2014 0811   K 3.6 03/21/2014 0811   CO2 23 03/21/2014 0811   GLUCOSE 171* 03/21/2014 0811   BUN 9.3 03/21/2014 0811   CREATININE 1.0 03/21/2014 0811   CALCIUM 9.9 03/21/2014 0811   PROT 6.7 03/21/2014 0811   ALBUMIN 3.4* 03/21/2014 0811   AST 18 03/21/2014 0811   ALT 25 03/21/2014 0811   ALKPHOS 77 03/21/2014 0811   BILITOT 0.26 03/21/2014 0811    No results found for this basename: SPEP,  UPEP,   kappa and lambda light chains    Lab Results  Component Value Date   WBC 4.9 03/21/2014   NEUTROABS 3.2 03/21/2014   HGB 10.2* 03/21/2014   HCT 33.5* 03/21/2014   MCV 78.6* 03/21/2014   PLT 275 03/21/2014      Chemistry      Component Value Date/Time   NA 142 03/21/2014 0811   K 3.6 03/21/2014 0811   CO2 23 03/21/2014 0811   BUN 9.3 03/21/2014 0811   CREATININE 1.0  03/21/2014 0811      Component Value Date/Time   CALCIUM 9.9 03/21/2014 0811   ALKPHOS 77 03/21/2014 0811   AST 18 03/21/2014 0811   ALT 25 03/21/2014 0811   BILITOT 0.26 03/21/2014 0811     ASSESSMENT & PLAN:  #1 multiple myeloma She has achieved great response to treatment. We'll continue her treatment per protocol. #2 right lower extremity DVT This is related to side effects of treatment. She is doing very well with current anticoagulation therapy with no bleeding complications. #3 mild cushingoid syndrome with hyperglycemia I suspect this is due to dexamethasone. Her research protocol, we will reduce the dexamethasone to 10 mg. #4 recent UTI She will complete her course of antibiotics. She is improved #5 antimicrobial prophylaxis I also recommend supportive care with prophylactic antibiotic coverage with Bactrim as well as acyclovir #6 jaw pain I recommend dental evaluation before we proceed with next cycle of Zometa. #7 anemia Was related to her underlying disease. It is improving. She is not symptomatic.  All questions were answered. The patient knows to call the clinic with any problems,  questions or concerns. No barriers to learning was detected. I spent 40 minutes counseling the patient face to face. The total time spent in the appointment was 55 minutes and more than 50% was on counseling and review of test results     Heath Lark, MD 03/21/2014 9:33 AM

## 2014-03-21 NOTE — Telephone Encounter (Signed)
Gave pt appt for lab,md and chemo for April, emailed her for May 2015

## 2014-03-21 NOTE — Patient Instructions (Signed)
[facility] Discharge Instructions for Patients Receiving Chemotherapy  Today you received the following chemotherapy agents VELCADE To help prevent nausea and vomiting after your treatment, we encourage you to take your nausea medication as prescribed. If you develop nausea and vomiting that is not controlled by your nausea medication, call the clinic.   BELOW ARE SYMPTOMS THAT SHOULD BE REPORTED IMMEDIATELY:  *FEVER GREATER THAN 100.5 F  *CHILLS WITH OR WITHOUT FEVER  NAUSEA AND VOMITING THAT IS NOT CONTROLLED WITH YOUR NAUSEA MEDICATION  *UNUSUAL SHORTNESS OF BREATH  *UNUSUAL BRUISING OR BLEEDING  TENDERNESS IN MOUTH AND THROAT WITH OR WITHOUT PRESENCE OF ULCERS  *URINARY PROBLEMS  *BOWEL PROBLEMS  UNUSUAL RASH Items with * indicate a potential emergency and should be followed up as soon as possible.  Feel free to call the clinic you have any questions or concerns. The clinic phone number is [phone].    

## 2014-03-22 ENCOUNTER — Encounter (HOSPITAL_COMMUNITY): Payer: Self-pay

## 2014-03-24 ENCOUNTER — Ambulatory Visit (HOSPITAL_BASED_OUTPATIENT_CLINIC_OR_DEPARTMENT_OTHER): Payer: 59

## 2014-03-24 ENCOUNTER — Encounter: Payer: Self-pay | Admitting: *Deleted

## 2014-03-24 ENCOUNTER — Telehealth: Payer: Self-pay | Admitting: Hematology and Oncology

## 2014-03-24 ENCOUNTER — Telehealth: Payer: Self-pay | Admitting: *Deleted

## 2014-03-24 VITALS — BP 137/73 | HR 87 | Temp 97.8°F

## 2014-03-24 DIAGNOSIS — C9000 Multiple myeloma not having achieved remission: Secondary | ICD-10-CM

## 2014-03-24 DIAGNOSIS — Z5112 Encounter for antineoplastic immunotherapy: Secondary | ICD-10-CM

## 2014-03-24 MED ORDER — ONDANSETRON HCL 8 MG PO TABS
ORAL_TABLET | ORAL | Status: AC
  Filled 2014-03-24: qty 1

## 2014-03-24 MED ORDER — ONDANSETRON HCL 8 MG PO TABS
8.0000 mg | ORAL_TABLET | Freq: Once | ORAL | Status: AC
  Administered 2014-03-24: 8 mg via ORAL

## 2014-03-24 MED ORDER — INV-BORTEZOMIB CHEMO SQ INJECTION 3.5 MG CTSU E1A11
1.0000 mg/m2 | Freq: Once | INTRAVENOUS | Status: AC
Start: 2014-03-24 — End: 2014-03-24
  Administered 2014-03-24: 2 mg via SUBCUTANEOUS
  Filled 2014-03-24: qty 2

## 2014-03-24 NOTE — Progress Notes (Signed)
03/24/2014 Patient in to clinic today for Day 4 treatment. Sign for injection given to Cira Rue RN. [name] BSN, RN, CCRP 03/24/2014 9:02 AM

## 2014-03-24 NOTE — Telephone Encounter (Signed)
Talked to pt,. she has gotten appt calendar for May and April 2015

## 2014-03-24 NOTE — Telephone Encounter (Signed)
[facility] Liter called to confirm patient's current treatment and continued need for leave from work.

## 2014-03-24 NOTE — Patient Instructions (Addendum)
[facility] Discharge Instructions for Patients Receiving Chemotherapy  Today you received the following chemotherapy agents velcade.  To help prevent nausea and vomiting after your treatment, we encourage you to take your nausea medication compazine.   If you develop nausea and vomiting that is not controlled by your nausea medication, call the clinic.   BELOW ARE SYMPTOMS THAT SHOULD BE REPORTED IMMEDIATELY:  *FEVER GREATER THAN 100.5 F  *CHILLS WITH OR WITHOUT FEVER  NAUSEA AND VOMITING THAT IS NOT CONTROLLED WITH YOUR NAUSEA MEDICATION  *UNUSUAL SHORTNESS OF BREATH  *UNUSUAL BRUISING OR BLEEDING  TENDERNESS IN MOUTH AND THROAT WITH OR WITHOUT PRESENCE OF ULCERS  *URINARY PROBLEMS  *BOWEL PROBLEMS  UNUSUAL RASH Items with * indicate a potential emergency and should be followed up as soon as possible.  Feel free to call the clinic you have any questions or concerns. The clinic phone number is [phone].    

## 2014-03-27 ENCOUNTER — Other Ambulatory Visit: Payer: Self-pay | Admitting: Hematology and Oncology

## 2014-03-27 DIAGNOSIS — I82409 Acute embolism and thrombosis of unspecified deep veins of unspecified lower extremity: Secondary | ICD-10-CM

## 2014-03-27 LAB — SPEP & IFE WITH QIG
Albumin ELP: 61.2 % (ref 55.8–66.1)
Alpha-1-Globulin: 4.9 % (ref 2.9–4.9)
Alpha-2-Globulin: 11.7 % (ref 7.1–11.8)
Beta 2: 3.9 % (ref 3.2–6.5)
Beta Globulin: 7.1 % (ref 4.7–7.2)
Gamma Globulin: 11.2 % (ref 11.1–18.8)
IgA: 18 mg/dL — ABNORMAL LOW (ref 69–380)
IgG (Immunoglobin G), Serum: 853 mg/dL (ref 690–1700)
IgM, Serum: 42 mg/dL — ABNORMAL LOW (ref 52–322)
M-Spike, %: 0.34 g/dL
Total Protein, Serum Electrophoresis: 6.3 g/dL (ref 6.0–8.3)

## 2014-03-27 LAB — KAPPA/LAMBDA LIGHT CHAINS
Kappa free light chain: 1.13 mg/dL (ref 0.33–1.94)
Kappa:Lambda Ratio: 0.6 (ref 0.26–1.65)
Lambda Free Lght Chn: 1.87 mg/dL (ref 0.57–2.63)

## 2014-03-27 MED ORDER — RIVAROXABAN 20 MG PO TABS: 20.0000 mg | ORAL_TABLET | Freq: Every day | ORAL | Status: DC

## 2014-03-28 ENCOUNTER — Other Ambulatory Visit: Payer: Self-pay | Admitting: *Deleted

## 2014-03-28 ENCOUNTER — Other Ambulatory Visit (HOSPITAL_BASED_OUTPATIENT_CLINIC_OR_DEPARTMENT_OTHER): Payer: 59

## 2014-03-28 ENCOUNTER — Encounter: Payer: Self-pay | Admitting: *Deleted

## 2014-03-28 ENCOUNTER — Ambulatory Visit (HOSPITAL_BASED_OUTPATIENT_CLINIC_OR_DEPARTMENT_OTHER): Payer: 59

## 2014-03-28 VITALS — BP 121/73 | HR 87 | Temp 99.1°F

## 2014-03-28 DIAGNOSIS — C9000 Multiple myeloma not having achieved remission: Secondary | ICD-10-CM

## 2014-03-28 DIAGNOSIS — Z5112 Encounter for antineoplastic immunotherapy: Secondary | ICD-10-CM

## 2014-03-28 LAB — CBC WITH DIFFERENTIAL/PLATELET
BASO%: 0.6 % (ref 0.0–2.0)
Basophils Absolute: 0 10*3/uL (ref 0.0–0.1)
EOS%: 7.7 % — ABNORMAL HIGH (ref 0.0–7.0)
Eosinophils Absolute: 0.3 10*3/uL (ref 0.0–0.5)
HCT: 33.6 % — ABNORMAL LOW (ref 34.8–46.6)
HGB: 10.5 g/dL — ABNORMAL LOW (ref 11.6–15.9)
LYMPH%: 20.8 % (ref 14.0–49.7)
MCH: 24.4 pg — ABNORMAL LOW (ref 25.1–34.0)
MCHC: 31.2 g/dL — ABNORMAL LOW (ref 31.5–36.0)
MCV: 78.3 fL — ABNORMAL LOW (ref 79.5–101.0)
MONO#: 0.2 10*3/uL (ref 0.1–0.9)
MONO%: 6.1 % (ref 0.0–14.0)
NEUT#: 2.6 10*3/uL (ref 1.5–6.5)
NEUT%: 64.8 % (ref 38.4–76.8)
Platelets: 230 10*3/uL (ref 145–400)
RBC: 4.3 10*6/uL (ref 3.70–5.45)
RDW: 17.8 % — ABNORMAL HIGH (ref 11.2–14.5)
WBC: 4 10*3/uL (ref 3.9–10.3)
lymph#: 0.8 10*3/uL — ABNORMAL LOW (ref 0.9–3.3)

## 2014-03-28 MED ORDER — ONDANSETRON HCL 8 MG PO TABS
ORAL_TABLET | ORAL | Status: AC
  Filled 2014-03-28: qty 1

## 2014-03-28 MED ORDER — ONDANSETRON HCL 8 MG PO TABS
8.0000 mg | ORAL_TABLET | Freq: Once | ORAL | Status: AC
  Administered 2014-03-28: 8 mg via ORAL

## 2014-03-28 MED ORDER — INV-BORTEZOMIB CHEMO SQ INJECTION 3.5 MG CTSU E1A11
1.0000 mg/m2 | Freq: Once | INTRAVENOUS | Status: AC
  Administered 2014-03-28: 2 mg via SUBCUTANEOUS
  Filled 2014-03-28: qty 2

## 2014-03-28 NOTE — Progress Notes (Signed)
03/28/2014 Patient in to clinic today for Day 8 treatment. CBC values are within parameters for continued treatment. Sign for injection given to [facility]. [name] BSN, RN, [place] 03/28/2014 8:51 AM

## 2014-03-28 NOTE — Patient Instructions (Signed)
[facility] Discharge Instructions for Patients Receiving Chemotherapy  Today you received the following chemotherapy agents velcade   To help prevent nausea and vomiting after your treatment, we encourage you to take your nausea medication as directed  If you develop nausea and vomiting that is not controlled by your nausea medication, call the clinic.   BELOW ARE SYMPTOMS THAT SHOULD BE REPORTED IMMEDIATELY:  *FEVER GREATER THAN 100.5 F  *CHILLS WITH OR WITHOUT FEVER  NAUSEA AND VOMITING THAT IS NOT CONTROLLED WITH YOUR NAUSEA MEDICATION  *UNUSUAL SHORTNESS OF BREATH  *UNUSUAL BRUISING OR BLEEDING  TENDERNESS IN MOUTH AND THROAT WITH OR WITHOUT PRESENCE OF ULCERS  *URINARY PROBLEMS  *BOWEL PROBLEMS  UNUSUAL RASH Items with * indicate a potential emergency and should be followed up as soon as possible.  Feel free to call the clinic you have any questions or concerns. The clinic phone number is [phone].  

## 2014-03-30 ENCOUNTER — Telehealth: Payer: Self-pay | Admitting: *Deleted

## 2014-03-30 NOTE — Telephone Encounter (Signed)
Pt left VM states at dentist and needs clearance for dental procedure.  Called pt back on cell phone and no VM available.  Do not know name of dentist to call to give clearance.  Dr. Alvy Bimler had instructed pt ok for root canal and had instructed her to hold xarelto for 5 days prior to procedure.

## 2014-03-30 NOTE — Telephone Encounter (Signed)
Pt left another VM states to ignore her first message as she has already left the dentist office.

## 2014-03-31 ENCOUNTER — Ambulatory Visit (HOSPITAL_BASED_OUTPATIENT_CLINIC_OR_DEPARTMENT_OTHER): Payer: 59

## 2014-03-31 ENCOUNTER — Other Ambulatory Visit: Payer: Self-pay | Admitting: Hematology and Oncology

## 2014-03-31 VITALS — BP 127/72 | HR 80 | Temp 98.5°F | Resp 18 | Ht 67.0 in

## 2014-03-31 DIAGNOSIS — Z5112 Encounter for antineoplastic immunotherapy: Secondary | ICD-10-CM

## 2014-03-31 DIAGNOSIS — C9000 Multiple myeloma not having achieved remission: Secondary | ICD-10-CM

## 2014-03-31 MED ORDER — INV-BORTEZOMIB CHEMO SQ INJECTION 3.5 MG CTSU E1A11
1.0000 mg/m2 | Freq: Once | INTRAVENOUS | Status: AC
  Administered 2014-03-31: 2 mg via SUBCUTANEOUS
  Filled 2014-03-31: qty 2

## 2014-03-31 MED ORDER — ONDANSETRON HCL 8 MG PO TABS
8.0000 mg | ORAL_TABLET | Freq: Once | ORAL | Status: AC
  Administered 2014-03-31: 8 mg via ORAL

## 2014-03-31 MED ORDER — ONDANSETRON HCL 8 MG PO TABS
ORAL_TABLET | ORAL | Status: AC
  Filled 2014-03-31: qty 1

## 2014-03-31 NOTE — Progress Notes (Signed)
Patient had visit with dentist yesterday for root canal.  She reports that dentist thinks her dental problems are from the zometa.  She is on amoxicillin 500mg  BID and a chlorhexedine rinse BID.  She is to see the dentist again in one month.  She will see Dr. Alvy Bimler on 5/12.  Tyrell Antonio RN from research here and Dr. Alvy Bimler is aware of dental visit.

## 2014-03-31 NOTE — Patient Instructions (Signed)
[facility] Discharge Instructions for Patients Receiving Chemotherapy  Today you received the following chemotherapy agents velcade.  To help prevent nausea and vomiting after your treatment, we encourage you to take your nausea medication compazine.   If you develop nausea and vomiting that is not controlled by your nausea medication, call the clinic.   BELOW ARE SYMPTOMS THAT SHOULD BE REPORTED IMMEDIATELY:  *FEVER GREATER THAN 100.5 F  *CHILLS WITH OR WITHOUT FEVER  NAUSEA AND VOMITING THAT IS NOT CONTROLLED WITH YOUR NAUSEA MEDICATION  *UNUSUAL SHORTNESS OF BREATH  *UNUSUAL BRUISING OR BLEEDING  TENDERNESS IN MOUTH AND THROAT WITH OR WITHOUT PRESENCE OF ULCERS  *URINARY PROBLEMS  *BOWEL PROBLEMS  UNUSUAL RASH Items with * indicate a potential emergency and should be followed up as soon as possible.  Feel free to call the clinic you have any questions or concerns. The clinic phone number is [phone].    

## 2014-04-03 ENCOUNTER — Other Ambulatory Visit: Payer: Self-pay | Admitting: *Deleted

## 2014-04-03 MED ORDER — LENALIDOMIDE 15 MG PO CAPS: 15.0000 mg | ORAL_CAPSULE | Freq: Every day | ORAL | Status: DC

## 2014-04-03 NOTE — Telephone Encounter (Signed)
THIS REFILL REQUEST FOR REVLIMID WAS PLACED IN DR.GORSUCH'S ACTIVE WORK FOLDER. 

## 2014-04-04 NOTE — Telephone Encounter (Signed)
RECEIVED A FAX FROM [employer] CONCERNING A CONFIRMATION OF FACSIMILE RECEIPT FOR PT. REFERRAL. 

## 2014-04-06 NOTE — Telephone Encounter (Signed)
[facility] sent facsimile confirmation of Revlimid prescription shipment.  Revlimid was shipped on 04-05-2014 with next business day delivery.

## 2014-04-11 ENCOUNTER — Other Ambulatory Visit: Payer: Self-pay | Admitting: Hematology and Oncology

## 2014-04-11 ENCOUNTER — Telehealth: Payer: Self-pay | Admitting: Hematology and Oncology

## 2014-04-11 ENCOUNTER — Ambulatory Visit (HOSPITAL_BASED_OUTPATIENT_CLINIC_OR_DEPARTMENT_OTHER): Payer: 59 | Admitting: Hematology and Oncology

## 2014-04-11 ENCOUNTER — Telehealth: Payer: Self-pay | Admitting: *Deleted

## 2014-04-11 ENCOUNTER — Encounter: Payer: 59 | Admitting: *Deleted

## 2014-04-11 ENCOUNTER — Encounter: Payer: Self-pay | Admitting: Hematology and Oncology

## 2014-04-11 ENCOUNTER — Ambulatory Visit (HOSPITAL_BASED_OUTPATIENT_CLINIC_OR_DEPARTMENT_OTHER): Payer: 59

## 2014-04-11 ENCOUNTER — Other Ambulatory Visit (HOSPITAL_BASED_OUTPATIENT_CLINIC_OR_DEPARTMENT_OTHER): Payer: 59

## 2014-04-11 VITALS — BP 144/78 | HR 90 | Temp 98.3°F | Resp 18 | Ht 67.0 in | Wt 191.4 lb

## 2014-04-11 DIAGNOSIS — D649 Anemia, unspecified: Secondary | ICD-10-CM

## 2014-04-11 DIAGNOSIS — I82409 Acute embolism and thrombosis of unspecified deep veins of unspecified lower extremity: Secondary | ICD-10-CM

## 2014-04-11 DIAGNOSIS — C9000 Multiple myeloma not having achieved remission: Secondary | ICD-10-CM

## 2014-04-11 DIAGNOSIS — Z95828 Presence of other vascular implants and grafts: Secondary | ICD-10-CM

## 2014-04-11 DIAGNOSIS — R7309 Other abnormal glucose: Secondary | ICD-10-CM

## 2014-04-11 DIAGNOSIS — M8708 Idiopathic aseptic necrosis of bone, other site: Secondary | ICD-10-CM

## 2014-04-11 DIAGNOSIS — Z5112 Encounter for antineoplastic immunotherapy: Secondary | ICD-10-CM

## 2014-04-11 DIAGNOSIS — D701 Agranulocytosis secondary to cancer chemotherapy: Secondary | ICD-10-CM

## 2014-04-11 DIAGNOSIS — D72829 Elevated white blood cell count, unspecified: Secondary | ICD-10-CM

## 2014-04-11 DIAGNOSIS — D630 Anemia in neoplastic disease: Secondary | ICD-10-CM

## 2014-04-11 DIAGNOSIS — T451X5A Adverse effect of antineoplastic and immunosuppressive drugs, initial encounter: Secondary | ICD-10-CM

## 2014-04-11 DIAGNOSIS — E249 Cushing's syndrome, unspecified: Secondary | ICD-10-CM

## 2014-04-11 LAB — CBC WITH DIFFERENTIAL/PLATELET
BASO%: 0.3 % (ref 0.0–2.0)
Basophils Absolute: 0 10*3/uL (ref 0.0–0.1)
EOS%: 0.8 % (ref 0.0–7.0)
Eosinophils Absolute: 0 10*3/uL (ref 0.0–0.5)
HCT: 33.5 % — ABNORMAL LOW (ref 34.8–46.6)
HGB: 10.3 g/dL — ABNORMAL LOW (ref 11.6–15.9)
LYMPH%: 18.3 % (ref 14.0–49.7)
MCH: 24.1 pg — ABNORMAL LOW (ref 25.1–34.0)
MCHC: 30.7 g/dL — ABNORMAL LOW (ref 31.5–36.0)
MCV: 78.5 fL — ABNORMAL LOW (ref 79.5–101.0)
MONO#: 0.3 10*3/uL (ref 0.1–0.9)
MONO%: 8.4 % (ref 0.0–14.0)
NEUT#: 2.6 10*3/uL (ref 1.5–6.5)
NEUT%: 72.2 % (ref 38.4–76.8)
Platelets: 191 10*3/uL (ref 145–400)
RBC: 4.27 10*6/uL (ref 3.70–5.45)
RDW: 16.8 % — ABNORMAL HIGH (ref 11.2–14.5)
WBC: 3.6 10*3/uL — ABNORMAL LOW (ref 3.9–10.3)
lymph#: 0.7 10*3/uL — ABNORMAL LOW (ref 0.9–3.3)

## 2014-04-11 LAB — COMPREHENSIVE METABOLIC PANEL (CC13)
ALT: 25 U/L (ref 0–55)
AST: 15 U/L (ref 5–34)
Albumin: 3.4 g/dL — ABNORMAL LOW (ref 3.5–5.0)
Alkaline Phosphatase: 59 U/L (ref 40–150)
Anion Gap: 11 mEq/L (ref 3–11)
BUN: 9.2 mg/dL (ref 7.0–26.0)
CO2: 22 mEq/L (ref 22–29)
Calcium: 9.9 mg/dL (ref 8.4–10.4)
Chloride: 108 mEq/L (ref 98–109)
Creatinine: 1 mg/dL (ref 0.6–1.1)
Glucose: 151 mg/dl — ABNORMAL HIGH (ref 70–140)
Potassium: 3.9 mEq/L (ref 3.5–5.1)
Sodium: 141 mEq/L (ref 136–145)
Total Bilirubin: 0.34 mg/dL (ref 0.20–1.20)
Total Protein: 6.4 g/dL (ref 6.4–8.3)

## 2014-04-11 LAB — LACTATE DEHYDROGENASE (CC13): LDH: 261 U/L — ABNORMAL HIGH (ref 125–245)

## 2014-04-11 MED ORDER — INV-BORTEZOMIB CHEMO SQ INJECTION 3.5 MG CTSU E1A11
1.0000 mg/m2 | Freq: Once | INTRAVENOUS | Status: AC
  Administered 2014-04-11: 2 mg via SUBCUTANEOUS
  Filled 2014-04-11: qty 2

## 2014-04-11 MED ORDER — ONDANSETRON HCL 8 MG PO TABS
8.0000 mg | ORAL_TABLET | Freq: Once | ORAL | Status: AC
  Administered 2014-04-11: 8 mg via ORAL

## 2014-04-11 MED ORDER — SODIUM CHLORIDE 0.9 % IJ SOLN
10.0000 mL | INTRAMUSCULAR | Status: DC | PRN
  Administered 2014-04-11: 10 mL via INTRAVENOUS
  Filled 2014-04-11: qty 10

## 2014-04-11 MED ORDER — HEPARIN SOD (PORK) LOCK FLUSH 100 UNIT/ML IV SOLN
500.0000 [IU] | Freq: Once | INTRAVENOUS | Status: AC
  Administered 2014-04-11: 500 [IU] via INTRAVENOUS
  Filled 2014-04-11: qty 5

## 2014-04-11 MED ORDER — ONDANSETRON HCL 8 MG PO TABS
ORAL_TABLET | ORAL | Status: AC
  Filled 2014-04-11: qty 1

## 2014-04-11 NOTE — Telephone Encounter (Signed)
Per staff phone call and POF I have schedueld appts.  JMW  

## 2014-04-11 NOTE — Progress Notes (Signed)
[place] OFFICE PROGRESS NOTE  Patient Care Team: Wenda Low, MD as PCP - General (Internal Medicine) Heath Lark, MD as Consulting Physician (Hematology and Oncology)  DIAGNOSIS: Multiple myeloma, ongoing treatment  SUMMARY OF ONCOLOGIC HISTORY: Oncology History   ISS stage 1 IgG lambda subtype (serum albumin 3.6, Beta2 microglobulin 2.32) Durie Salmon Stage 1     Multiple myeloma, without mention of having achieved remission   10/10/2013 Imaging Skeletal survery was negative   11/09/2013 Bone Marrow Biopsy BM biopsy confirmed myeloma, 76% involved, IgG lambda subtype   12/06/2013 -  Chemotherapy Start cycle 1 of chemo with revlimid, Velcade, Dexamethasone and Zometa. 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    INTERVAL HISTORY: Carla Perry 58 y.o. female returns for further followup. Her right jaw swelling is improving with oral antibiotics, as prescribed by her dentist. She denies any jaw pain. She denies any side effects of treatment such as nausea or change in bowel habits. She remained on anticoagulation therapy for DVT. She denies any leg swelling.The patient denies any recent signs or symptoms of bleeding such as spontaneous epistaxis, hematuria or hematochezia.   I have reviewed the past medical history, past surgical history, social history and family history with the patient and they are unchanged from previous note.  ALLERGIES:  has No Known Allergies.  MEDICATIONS:  Current Outpatient Prescriptions  Medication Sig Dispense Refill  . acyclovir (ZOVIRAX) 400 MG tablet Take 1 tablet (400 mg total) by mouth 2 (two) times daily.  60 tablet  6  . amoxicillin (AMOXIL) 500 MG capsule       . calcium carbonate (TUMS - DOSED IN MG ELEMENTAL CALCIUM) 500 MG chewable tablet Chew 1  tablet by mouth as needed for indigestion or heartburn.      . carboxymethylcellulose (REFRESH PLUS) 0.5 % SOLN 1 drop daily as needed.      . Cholecalciferol (VITAMIN D3) 2000 UNITS TABS Take 2,000 Units by mouth daily.      . ciprofloxacin (CIPRO) 500 MG tablet Take 1 tablet (500 mg total) by mouth 2 (two) times daily.  14 tablet  0  . dexamethasone (DECADRON) 4 MG tablet TAKE 10 TABLETS BY MOUTH EVERY WEEK AS DIRECTED  100 tablet  2  . hydrochlorothiazide (HYDRODIURIL) 25 MG tablet Take 25 mg by mouth daily with breakfast.       . lenalidomide (REVLIMID) 15 MG capsule Take 1 capsule (15 mg total) by mouth daily. X 14 days on and 7 days off.  14 capsule  0  . levothyroxine (SYNTHROID, LEVOTHROID) 75 MCG tablet Take 75 mcg by mouth daily before breakfast.      . lidocaine-prilocaine (EMLA) cream Apply 1 application topically as needed (port).      . Multiple Vitamins-Minerals (CENTRUM SILVER PO) Take 1 tablet by mouth daily.       . polyethylene glycol (MIRALAX / GLYCOLAX) packet Take 17 g by mouth daily.      . prochlorperazine (COMPAZINE) 10 MG tablet Take 1 tablet (10 mg total) by mouth every 6 (six) hours as needed (Nausea or vomiting).  30 tablet  1  . rivaroxaban (XARELTO) 20 MG TABS tablet Take 1 tablet (20 mg total) by mouth daily with supper.  30 tablet  6  . sulfamethoxazole-trimethoprim (BACTRIM DS,SEPTRA DS) 800-160 MG per tablet Take  1 tablet by mouth as directed. 1 tab twice a day on Mondays, Wednesday and Friday  60 tablet  1  . traMADol (ULTRAM) 50 MG tablet Take 1 tablet (50 mg total) by mouth every 6 (six) hours as needed for moderate pain or severe pain.  60 tablet  0  . venlafaxine XR (EFFEXOR-XR) 75 MG 24 hr capsule Take 1 capsule (75 mg total) by mouth 2 (two) times daily.  10 capsule  1  . verapamil (VERELAN PM) 120 MG 24 hr capsule Take 120 mg by mouth at bedtime.       No current facility-administered medications for this visit.    REVIEW OF SYSTEMS:   Constitutional:  Denies fevers, chills or abnormal weight loss Eyes: Denies blurriness of vision Ears, nose, mouth, throat, and face: Denies mucositis or sore throat Respiratory: Denies cough, dyspnea or wheezes Cardiovascular: Denies palpitation, chest discomfort or lower extremity swelling Gastrointestinal:  Denies nausea, heartburn or change in bowel habits Skin: Denies abnormal skin rashes Lymphatics: Denies new lymphadenopathy or easy bruising Neurological:Denies numbness, tingling or new weaknesses Behavioral/Psych: Mood is stable, no new changes  All other systems were reviewed with the patient and are negative.  PHYSICAL EXAMINATION: ECOG PERFORMANCE STATUS: 0 - Asymptomatic  Filed Vitals:   04/11/14 0955  BP: 144/78  Pulse: 90  Temp: 98.3 F (36.8 C)  Resp: 18   Filed Weights   04/11/14 0955  Weight: 191 lb 6.4 oz (86.818 kg)    GENERAL:alert, no distress and comfortable. She looks cushingoid SKIN: skin color, texture, turgor are normal, no rashes or significant lesions EYES: normal, Conjunctiva are pink and non-injected, sclera clear OROPHARYNX:no exudate, no erythema and lips, buccal mucosa, and tongue normal . I am unable to appreciate osteonecrosis of the jaw on exam NECK: supple, thyroid normal size, non-tender, without nodularity LYMPH:  no palpable lymphadenopathy in the cervical, axillary or inguinal LUNGS: clear to auscultation and percussion with normal breathing effort HEART: regular rate & rhythm and no murmurs and no lower extremity edema ABDOMEN:abdomen soft, non-tender and normal bowel sounds Musculoskeletal:no cyanosis of digits and no clubbing  NEURO: alert & oriented x 3 with fluent speech, no focal motor/sensory deficits  LABORATORY DATA:  I have reviewed the data as listed    Component Value Date/Time   NA 142 03/21/2014 0811   K 3.6 03/21/2014 0811   CO2 23 03/21/2014 0811   GLUCOSE 171* 03/21/2014 0811   BUN 9.3 03/21/2014 0811   CREATININE 1.0 03/21/2014 0811    CALCIUM 9.9 03/21/2014 0811   PROT 6.7 03/21/2014 0811   ALBUMIN 3.4* 03/21/2014 0811   AST 18 03/21/2014 0811   ALT 25 03/21/2014 0811   ALKPHOS 77 03/21/2014 0811   BILITOT 0.26 03/21/2014 0811    No results found for this basename: SPEP,  UPEP,   kappa and lambda light chains    Lab Results  Component Value Date   WBC 3.6* 04/11/2014   NEUTROABS 2.6 04/11/2014   HGB 10.3* 04/11/2014   HCT 33.5* 04/11/2014   MCV 78.5* 04/11/2014   PLT 191 04/11/2014      Chemistry      Component Value Date/Time   NA 142 03/21/2014 0811   K 3.6 03/21/2014 0811   CO2 23 03/21/2014 0811   BUN 9.3 03/21/2014 0811   CREATININE 1.0 03/21/2014 0811      Component Value Date/Time   CALCIUM 9.9 03/21/2014 0811   ALKPHOS 77 03/21/2014 0811   AST 18 03/21/2014  0811   ALT 25 03/21/2014 0811   BILITOT 0.26 03/21/2014 0811     ASSESSMENT & PLAN:  #1 multiple myeloma She has achieved great response to treatment. We'll continue her treatment per protocol. #2 right lower extremity DVT This is related to side effects of treatment. She is doing very well with current anticoagulation therapy with no bleeding complications. #3 mild cushingoid syndrome with hyperglycemia I suspect this is due to dexamethasone. Per research protocol, we will reduce the dexamethasone to 10 mg. #4 mild leukopenia This is treatment related. She is not symptomatic. #5 antimicrobial prophylaxis I also recommend supportive care with prophylactic antibiotic coverage with acyclovir. 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 She is improving with oral amoxicillin. Zometa was discontinued. #7 anemia Was related to her underlying disease. It is improving. She is not symptomatic.  All questions were answered. The patient knows to call the clinic with any problems, questions or concerns. No barriers to learning was detected.    Heath Lark,  MD 04/11/2014 10:12 AM

## 2014-04-11 NOTE — Progress Notes (Signed)
04/11/2014 Patient in to clinic today for evaluation prior to receiving treatment cycle 7. Patient states she has felt her best ever during this past cycle. She again experienced some eye pain/light sensitivity, although it was not as severe as previously (grade 1). She had mild leg pain on day 13 of the cycle and mild sore throat on day 15. Based on lab results review and history and physical by Dr. Alvy Bimler, patient condition is acceptable for continued treatment today.  Patient returned completed cycle 6 Patient Medication Calendar, confirming correct dosing as prescribed. Blank cycle 7 Patient Medication Calendar was given to patient today for completion. Sign for injection given to [name] RN.   [name] BSN, RN, [place] 04/11/2014 2:11 PM

## 2014-04-11 NOTE — Telephone Encounter (Signed)
Gave pt appt for lab,md and chemo for May and June 2015 °

## 2014-04-11 NOTE — Patient Instructions (Signed)
[facility] Discharge Instructions for Patients Receiving Chemotherapy  Today you received the following chemotherapy agents: Velcade.  To help prevent nausea and vomiting after your treatment, we encourage you to take your nausea medication as prescribed.   If you develop nausea and vomiting that is not controlled by your nausea medication, call the clinic.   BELOW ARE SYMPTOMS THAT SHOULD BE REPORTED IMMEDIATELY:  *FEVER GREATER THAN 100.5 F  *CHILLS WITH OR WITHOUT FEVER  NAUSEA AND VOMITING THAT IS NOT CONTROLLED WITH YOUR NAUSEA MEDICATION  *UNUSUAL SHORTNESS OF BREATH  *UNUSUAL BRUISING OR BLEEDING  TENDERNESS IN MOUTH AND THROAT WITH OR WITHOUT PRESENCE OF ULCERS  *URINARY PROBLEMS  *BOWEL PROBLEMS  UNUSUAL RASH Items with * indicate a potential emergency and should be followed up as soon as possible.  Feel free to call the clinic you have any questions or concerns. The clinic phone number is [phone].    

## 2014-04-12 ENCOUNTER — Encounter: Payer: Self-pay | Admitting: Hematology and Oncology

## 2014-04-13 ENCOUNTER — Other Ambulatory Visit: Payer: Self-pay | Admitting: Hematology and Oncology

## 2014-04-13 ENCOUNTER — Other Ambulatory Visit: Payer: Self-pay | Admitting: *Deleted

## 2014-04-13 ENCOUNTER — Encounter: Payer: Self-pay | Admitting: *Deleted

## 2014-04-13 DIAGNOSIS — I82409 Acute embolism and thrombosis of unspecified deep veins of unspecified lower extremity: Secondary | ICD-10-CM

## 2014-04-13 LAB — SPEP & IFE WITH QIG
Albumin ELP: 64.2 % (ref 55.8–66.1)
Alpha-1-Globulin: 4 % (ref 2.9–4.9)
Alpha-2-Globulin: 10.2 % (ref 7.1–11.8)
Beta 2: 3.6 % (ref 3.2–6.5)
Beta Globulin: 7.4 % — ABNORMAL HIGH (ref 4.7–7.2)
Gamma Globulin: 10.6 % — ABNORMAL LOW (ref 11.1–18.8)
IgA: 18 mg/dL — ABNORMAL LOW (ref 69–380)
IgG (Immunoglobin G), Serum: 781 mg/dL (ref 690–1700)
IgM, Serum: 44 mg/dL — ABNORMAL LOW (ref 52–322)
M-Spike, %: 0.32 g/dL
Total Protein, Serum Electrophoresis: 5.9 g/dL — ABNORMAL LOW (ref 6.0–8.3)

## 2014-04-13 LAB — KAPPA/LAMBDA LIGHT CHAINS
Kappa free light chain: 0.17 mg/dL — ABNORMAL LOW (ref 0.33–1.94)
Kappa:Lambda Ratio: 0.1 — ABNORMAL LOW (ref 0.26–1.65)
Lambda Free Lght Chn: 1.71 mg/dL (ref 0.57–2.63)

## 2014-04-13 MED ORDER — RIVAROXABAN 20 MG PO TABS: 20.0000 mg | ORAL_TABLET | Freq: Every day | ORAL | Status: DC

## 2014-04-13 NOTE — Telephone Encounter (Signed)
Informed pt  90 day supply Xarelto faxed to [facility] this morning. (fax [phone])

## 2014-04-13 NOTE — Telephone Encounter (Signed)
Notified patient that 90 supply of her Xarelto has been electronically sent to [facility] as she requested per OK from Dr. Alvy Bimler.

## 2014-04-14 ENCOUNTER — Encounter: Payer: Self-pay | Admitting: *Deleted

## 2014-04-14 ENCOUNTER — Ambulatory Visit (HOSPITAL_BASED_OUTPATIENT_CLINIC_OR_DEPARTMENT_OTHER): Payer: 59

## 2014-04-14 ENCOUNTER — Telehealth: Payer: Self-pay | Admitting: Hematology and Oncology

## 2014-04-14 VITALS — BP 117/74 | HR 89 | Temp 97.8°F | Resp 18

## 2014-04-14 DIAGNOSIS — Z5112 Encounter for antineoplastic immunotherapy: Secondary | ICD-10-CM

## 2014-04-14 DIAGNOSIS — C9000 Multiple myeloma not having achieved remission: Secondary | ICD-10-CM

## 2014-04-14 MED ORDER — INV-BORTEZOMIB CHEMO SQ INJECTION 3.5 MG CTSU E1A11
1.0000 mg/m2 | Freq: Once | INTRAVENOUS | Status: AC
  Administered 2014-04-14: 2 mg via SUBCUTANEOUS
  Filled 2014-04-14: qty 2

## 2014-04-14 MED ORDER — ONDANSETRON HCL 8 MG PO TABS
8.0000 mg | ORAL_TABLET | Freq: Once | ORAL | Status: AC
  Administered 2014-04-14: 8 mg via ORAL

## 2014-04-14 MED ORDER — ONDANSETRON HCL 8 MG PO TABS
ORAL_TABLET | ORAL | Status: AC
  Filled 2014-04-14: qty 1

## 2014-04-14 NOTE — Progress Notes (Signed)
04/14/2014 Patient in to clinic today for Day 4 treatment. She has no specific complaints today. Updated schedule given to patient, including changes to Cycle 8 visits. Sign for injection given to Jesse Fall, RN. [name] BSN, RN, [place] 04/14/2014 2:40 PM

## 2014-04-14 NOTE — Telephone Encounter (Signed)
changed appt per pof....per pof pt going to pick up sched today

## 2014-04-14 NOTE — Progress Notes (Signed)
04/14/2014 Corrected schedule given to patient today. [name] BSN, RN, [place] 04/14/2014 2:41 PM

## 2014-04-17 ENCOUNTER — Other Ambulatory Visit: Payer: Self-pay | Admitting: *Deleted

## 2014-04-18 ENCOUNTER — Encounter: Payer: Self-pay | Admitting: *Deleted

## 2014-04-18 ENCOUNTER — Other Ambulatory Visit (HOSPITAL_BASED_OUTPATIENT_CLINIC_OR_DEPARTMENT_OTHER): Payer: 59

## 2014-04-18 ENCOUNTER — Ambulatory Visit (HOSPITAL_BASED_OUTPATIENT_CLINIC_OR_DEPARTMENT_OTHER): Payer: 59

## 2014-04-18 VITALS — BP 117/64 | HR 82 | Temp 98.0°F | Resp 18

## 2014-04-18 DIAGNOSIS — C9000 Multiple myeloma not having achieved remission: Secondary | ICD-10-CM

## 2014-04-18 DIAGNOSIS — Z5112 Encounter for antineoplastic immunotherapy: Secondary | ICD-10-CM

## 2014-04-18 LAB — CBC WITH DIFFERENTIAL/PLATELET
BASO%: 0.3 % (ref 0.0–2.0)
Basophils Absolute: 0 10*3/uL (ref 0.0–0.1)
EOS%: 5.4 % (ref 0.0–7.0)
Eosinophils Absolute: 0.2 10*3/uL (ref 0.0–0.5)
HCT: 32.9 % — ABNORMAL LOW (ref 34.8–46.6)
HGB: 10.2 g/dL — ABNORMAL LOW (ref 11.6–15.9)
LYMPH%: 19.5 % (ref 14.0–49.7)
MCH: 24.2 pg — ABNORMAL LOW (ref 25.1–34.0)
MCHC: 31 g/dL — ABNORMAL LOW (ref 31.5–36.0)
MCV: 78 fL — ABNORMAL LOW (ref 79.5–101.0)
MONO#: 0.2 10*3/uL (ref 0.1–0.9)
MONO%: 6.2 % (ref 0.0–14.0)
NEUT#: 2.7 10*3/uL (ref 1.5–6.5)
NEUT%: 68.6 % (ref 38.4–76.8)
Platelets: 172 10*3/uL (ref 145–400)
RBC: 4.22 10*6/uL (ref 3.70–5.45)
RDW: 16.4 % — ABNORMAL HIGH (ref 11.2–14.5)
WBC: 3.9 10*3/uL (ref 3.9–10.3)
lymph#: 0.8 10*3/uL — ABNORMAL LOW (ref 0.9–3.3)
nRBC: 1 % — ABNORMAL HIGH (ref 0–0)

## 2014-04-18 MED ORDER — INV-BORTEZOMIB CHEMO SQ INJECTION 3.5 MG CTSU E1A11
1.0000 mg/m2 | Freq: Once | INTRAVENOUS | Status: AC
  Administered 2014-04-18: 2 mg via SUBCUTANEOUS
  Filled 2014-04-18: qty 2

## 2014-04-18 MED ORDER — ONDANSETRON HCL 8 MG PO TABS
8.0000 mg | ORAL_TABLET | Freq: Once | ORAL | Status: AC
  Administered 2014-04-18: 8 mg via ORAL

## 2014-04-18 MED ORDER — ONDANSETRON HCL 8 MG PO TABS
ORAL_TABLET | ORAL | Status: AC
  Filled 2014-04-18: qty 1

## 2014-04-18 NOTE — Patient Instructions (Signed)
[facility] [place] Discharge Instructions for Patients Receiving Chemotherapy  Today you received the following chemotherapy agents :  Velcade.  To help prevent nausea and vomiting after your treatment, we encourage you to take your nausea medication as prescribed by your physician.   If you develop nausea and vomiting that is not controlled by your nausea medication, call the clinic.   BELOW ARE SYMPTOMS THAT SHOULD BE REPORTED IMMEDIATELY:  *FEVER GREATER THAN 100.5 F  *CHILLS WITH OR WITHOUT FEVER  NAUSEA AND VOMITING THAT IS NOT CONTROLLED WITH YOUR NAUSEA MEDICATION  *UNUSUAL SHORTNESS OF BREATH  *UNUSUAL BRUISING OR BLEEDING  TENDERNESS IN MOUTH AND THROAT WITH OR WITHOUT PRESENCE OF ULCERS  *URINARY PROBLEMS  *BOWEL PROBLEMS  UNUSUAL RASH Items with * indicate a potential emergency and should be followed up as soon as possible.  Feel free to call the clinic you have any questions or concerns. The clinic phone number is [phone].

## 2014-04-18 NOTE — Progress Notes (Signed)
04/18/2014 Patient in to clinic this morning for Day 8 treatment. CBC results are acceptable for continued treatment per protocol. Patient has no new complaints to report today. Sign for injection given to Elray Buba RN. [name] BSN, RN, [place] 04/18/2014 12:34 PM

## 2014-04-20 ENCOUNTER — Encounter: Payer: Self-pay | Admitting: Hematology and Oncology

## 2014-04-21 ENCOUNTER — Ambulatory Visit (HOSPITAL_BASED_OUTPATIENT_CLINIC_OR_DEPARTMENT_OTHER): Payer: 59

## 2014-04-21 ENCOUNTER — Encounter: Payer: Self-pay | Admitting: *Deleted

## 2014-04-21 ENCOUNTER — Telehealth: Payer: Self-pay | Admitting: *Deleted

## 2014-04-21 VITALS — BP 139/75 | HR 86 | Temp 98.1°F | Resp 18

## 2014-04-21 DIAGNOSIS — C9000 Multiple myeloma not having achieved remission: Secondary | ICD-10-CM

## 2014-04-21 DIAGNOSIS — Z5112 Encounter for antineoplastic immunotherapy: Secondary | ICD-10-CM

## 2014-04-21 MED ORDER — ONDANSETRON HCL 8 MG PO TABS
ORAL_TABLET | ORAL | Status: AC
  Filled 2014-04-21: qty 1

## 2014-04-21 MED ORDER — INV-BORTEZOMIB CHEMO SQ INJECTION 3.5 MG CTSU E1A11
1.0000 mg/m2 | Freq: Once | INTRAVENOUS | Status: AC
  Administered 2014-04-21: 2 mg via SUBCUTANEOUS
  Filled 2014-04-21: qty 2

## 2014-04-21 MED ORDER — ONDANSETRON HCL 8 MG PO TABS
8.0000 mg | ORAL_TABLET | Freq: Once | ORAL | Status: AC
  Administered 2014-04-21: 8 mg via ORAL

## 2014-04-21 NOTE — Telephone Encounter (Signed)
Pt states has pain on right side of her throat w/ swallowing.  She wonders if this is due to the "dead bone" in her jaw or does she have blood clot?  Informed pt unlikely to be blood clot since she is on xarelto. Her port a cath is on the same side, right side.  States port was working fine when it was flushed last week.

## 2014-04-21 NOTE — Telephone Encounter (Signed)
Informed pt pain is likely r/t her jaw.  She states she has pain medication and will take as needed. Instructed pt to call if pain worsens or any new symptoms. She verbalized understanding.

## 2014-04-21 NOTE — Progress Notes (Signed)
04/21/2014 Patient in to clinic today for Day 11 Velcade injection. Sign for injection given to Jaci Carrel RN. [name] BSN, RN, [place] 04/21/2014 3:52 PM

## 2014-04-21 NOTE — Patient Instructions (Signed)
[facility] Discharge Instructions for Patients Receiving Chemotherapy  Today you received the following chemotherapy agents: Velcade. To help prevent nausea and vomiting after your treatment, we encourage you to take your nausea medication.  If you develop nausea and vomiting that is not controlled by your nausea medication, call the clinic.   BELOW ARE SYMPTOMS THAT SHOULD BE REPORTED IMMEDIATELY:  *FEVER GREATER THAN 100.5 F  *CHILLS WITH OR WITHOUT FEVER  NAUSEA AND VOMITING THAT IS NOT CONTROLLED WITH YOUR NAUSEA MEDICATION  *UNUSUAL SHORTNESS OF BREATH  *UNUSUAL BRUISING OR BLEEDING  TENDERNESS IN MOUTH AND THROAT WITH OR WITHOUT PRESENCE OF ULCERS  *URINARY PROBLEMS  *BOWEL PROBLEMS  UNUSUAL RASH Items with * indicate a potential emergency and should be followed up as soon as possible.  Feel free to call the clinic you have any questions or concerns. The clinic phone number is [phone].    

## 2014-04-21 NOTE — Telephone Encounter (Signed)
Likely related to her jaw Does she has pain medicine to take?

## 2014-04-21 NOTE — Telephone Encounter (Signed)
Left VM for pt to return nurse's call regarding email she sent yesterday.

## 2014-04-25 ENCOUNTER — Other Ambulatory Visit: Payer: Self-pay | Admitting: *Deleted

## 2014-04-25 ENCOUNTER — Encounter: Payer: Self-pay | Admitting: Hematology and Oncology

## 2014-04-25 MED ORDER — LENALIDOMIDE 15 MG PO CAPS: 15.0000 mg | ORAL_CAPSULE | Freq: Every day | ORAL | Status: DC

## 2014-04-25 NOTE — Telephone Encounter (Signed)
Refill request to MD desk for approval. 

## 2014-04-27 ENCOUNTER — Telehealth: Payer: Self-pay | Admitting: *Deleted

## 2014-04-27 NOTE — Telephone Encounter (Signed)
[facility] faxed confirmation of facsimile receipt for Revlimid prescription referral.  [facility] will verify insurance and make delivery arrangements with patient. 

## 2014-04-28 ENCOUNTER — Other Ambulatory Visit: Payer: Self-pay | Admitting: *Deleted

## 2014-05-01 NOTE — Telephone Encounter (Signed)
RECEIVED A FAX FROM [facility] CONCERNING A CONFIRMATION OF PRESCRIPTION SHIPMENT FOR REVLIMID ON 04/28/14.

## 2014-05-02 ENCOUNTER — Telehealth: Payer: Self-pay | Admitting: Hematology and Oncology

## 2014-05-02 ENCOUNTER — Other Ambulatory Visit: Payer: 59

## 2014-05-02 ENCOUNTER — Ambulatory Visit (HOSPITAL_BASED_OUTPATIENT_CLINIC_OR_DEPARTMENT_OTHER): Payer: 59 | Admitting: Hematology and Oncology

## 2014-05-02 ENCOUNTER — Encounter: Payer: 59 | Admitting: *Deleted

## 2014-05-02 ENCOUNTER — Ambulatory Visit (HOSPITAL_BASED_OUTPATIENT_CLINIC_OR_DEPARTMENT_OTHER): Payer: 59

## 2014-05-02 ENCOUNTER — Other Ambulatory Visit (HOSPITAL_BASED_OUTPATIENT_CLINIC_OR_DEPARTMENT_OTHER): Payer: 59

## 2014-05-02 VITALS — BP 140/70 | HR 78 | Temp 98.0°F | Resp 18 | Ht 67.0 in | Wt 193.7 lb

## 2014-05-02 DIAGNOSIS — D72819 Decreased white blood cell count, unspecified: Secondary | ICD-10-CM

## 2014-05-02 DIAGNOSIS — M8700 Idiopathic aseptic necrosis of unspecified bone: Secondary | ICD-10-CM

## 2014-05-02 DIAGNOSIS — C9000 Multiple myeloma not having achieved remission: Secondary | ICD-10-CM

## 2014-05-02 DIAGNOSIS — M542 Cervicalgia: Secondary | ICD-10-CM

## 2014-05-02 DIAGNOSIS — D630 Anemia in neoplastic disease: Secondary | ICD-10-CM

## 2014-05-02 DIAGNOSIS — T451X5A Adverse effect of antineoplastic and immunosuppressive drugs, initial encounter: Secondary | ICD-10-CM

## 2014-05-02 DIAGNOSIS — M898X9 Other specified disorders of bone, unspecified site: Secondary | ICD-10-CM

## 2014-05-02 DIAGNOSIS — I82409 Acute embolism and thrombosis of unspecified deep veins of unspecified lower extremity: Secondary | ICD-10-CM

## 2014-05-02 DIAGNOSIS — D701 Agranulocytosis secondary to cancer chemotherapy: Secondary | ICD-10-CM

## 2014-05-02 DIAGNOSIS — Z5112 Encounter for antineoplastic immunotherapy: Secondary | ICD-10-CM

## 2014-05-02 DIAGNOSIS — M8710 Osteonecrosis due to drugs, unspecified bone: Secondary | ICD-10-CM | POA: Insufficient documentation

## 2014-05-02 DIAGNOSIS — D61811 Other drug-induced pancytopenia: Secondary | ICD-10-CM | POA: Insufficient documentation

## 2014-05-02 LAB — COMPREHENSIVE METABOLIC PANEL (CC13)
ALT: 28 U/L (ref 0–55)
AST: 18 U/L (ref 5–34)
Albumin: 3.1 g/dL — ABNORMAL LOW (ref 3.5–5.0)
Alkaline Phosphatase: 52 U/L (ref 40–150)
Anion Gap: 13 mEq/L — ABNORMAL HIGH (ref 3–11)
BUN: 6.6 mg/dL — ABNORMAL LOW (ref 7.0–26.0)
CO2: 20 mEq/L — ABNORMAL LOW (ref 22–29)
Calcium: 8.6 mg/dL (ref 8.4–10.4)
Chloride: 107 mEq/L (ref 98–109)
Creatinine: 0.8 mg/dL (ref 0.6–1.1)
Glucose: 212 mg/dl — ABNORMAL HIGH (ref 70–140)
Potassium: 3.5 mEq/L (ref 3.5–5.1)
Sodium: 140 mEq/L (ref 136–145)
Total Bilirubin: 0.46 mg/dL (ref 0.20–1.20)
Total Protein: 6.1 g/dL — ABNORMAL LOW (ref 6.4–8.3)

## 2014-05-02 LAB — CBC WITH DIFFERENTIAL/PLATELET
BASO%: 1.6 % (ref 0.0–2.0)
Basophils Absolute: 0.1 10*3/uL (ref 0.0–0.1)
EOS%: 1.6 % (ref 0.0–7.0)
Eosinophils Absolute: 0.1 10*3/uL (ref 0.0–0.5)
HCT: 33.3 % — ABNORMAL LOW (ref 34.8–46.6)
HGB: 10.2 g/dL — ABNORMAL LOW (ref 11.6–15.9)
LYMPH%: 23.6 % (ref 14.0–49.7)
MCH: 24.4 pg — ABNORMAL LOW (ref 25.1–34.0)
MCHC: 30.8 g/dL — ABNORMAL LOW (ref 31.5–36.0)
MCV: 79.3 fL — ABNORMAL LOW (ref 79.5–101.0)
MONO#: 0.5 10*3/uL (ref 0.1–0.9)
MONO%: 14.5 % — ABNORMAL HIGH (ref 0.0–14.0)
NEUT#: 2.2 10*3/uL (ref 1.5–6.5)
NEUT%: 58.7 % (ref 38.4–76.8)
Platelets: 209 10*3/uL (ref 145–400)
RBC: 4.2 10*6/uL (ref 3.70–5.45)
RDW: 16.8 % — ABNORMAL HIGH (ref 11.2–14.5)
WBC: 3.8 10*3/uL — ABNORMAL LOW (ref 3.9–10.3)
lymph#: 0.9 10*3/uL (ref 0.9–3.3)

## 2014-05-02 LAB — TSH CHCC: TSH: 0.697 m(IU)/L (ref 0.308–3.960)

## 2014-05-02 LAB — LACTATE DEHYDROGENASE (CC13): LDH: 257 U/L — ABNORMAL HIGH (ref 125–245)

## 2014-05-02 MED ORDER — ONDANSETRON HCL 8 MG PO TABS
8.0000 mg | ORAL_TABLET | Freq: Once | ORAL | Status: AC
  Administered 2014-05-02: 8 mg via ORAL

## 2014-05-02 MED ORDER — INV-BORTEZOMIB CHEMO SQ INJECTION 3.5 MG CTSU E1A11
1.0000 mg/m2 | Freq: Once | INTRAVENOUS | Status: AC
  Administered 2014-05-02: 2 mg via SUBCUTANEOUS
  Filled 2014-05-02: qty 2

## 2014-05-02 MED ORDER — OXYCODONE HCL 10 MG PO TABS: 10.0000 mg | ORAL_TABLET | ORAL | Status: DC | PRN

## 2014-05-02 MED ORDER — ONDANSETRON HCL 8 MG PO TABS
ORAL_TABLET | ORAL | Status: AC
  Filled 2014-05-02: qty 1

## 2014-05-02 NOTE — Assessment & Plan Note (Signed)
She is doing well on anticoagulation therapy. She has no bleeding complications. Continue the same.     

## 2014-05-02 NOTE — Assessment & Plan Note (Signed)

## 2014-05-02 NOTE — Assessment & Plan Note (Addendum)
This has been present for about a month. The neck pain is related to physical activity. It is interfering with sleep. Tramadol is not helping. I recommend a trial of oxycodone. I educated about side effects to be expected from oxycodone. It is on the same side as her Infuse-a-Port and her osteonecrosis of the jaw. I recommend we proceed with CT imaging study to exclude abscess or clot around her Infuse-a-Port.

## 2014-05-02 NOTE — Patient Instructions (Signed)
[facility] Discharge Instructions for Patients Receiving Chemotherapy  Today you received the following chemotherapy agents: Velcade  To help prevent nausea and vomiting after your treatment, we encourage you to take your nausea medication: Compazine 10 mg every 6 hrs as needed.    If you develop nausea and vomiting that is not controlled by your nausea medication, call the clinic.   BELOW ARE SYMPTOMS THAT SHOULD BE REPORTED IMMEDIATELY:  *FEVER GREATER THAN 100.5 F  *CHILLS WITH OR WITHOUT FEVER  NAUSEA AND VOMITING THAT IS NOT CONTROLLED WITH YOUR NAUSEA MEDICATION  *UNUSUAL SHORTNESS OF BREATH  *UNUSUAL BRUISING OR BLEEDING  TENDERNESS IN MOUTH AND THROAT WITH OR WITHOUT PRESENCE OF ULCERS  *URINARY PROBLEMS  *BOWEL PROBLEMS  UNUSUAL RASH Items with * indicate a potential emergency and should be followed up as soon as possible.  Feel free to call the clinic you have any questions or concerns. The clinic phone number is [phone].    

## 2014-05-02 NOTE — Assessment & Plan Note (Signed)
She is experiencing no side effects from treatment so far. I recommend we proceed with treatment without dosage adjustment. She will continue her blood work and tests as required by her chemotherapy protocol.

## 2014-05-02 NOTE — Telephone Encounter (Signed)
s.w. pt and advised that 6.26 tx cx per tx plan and Cynthia...also advised that i added 6.30 lab and tx....pt ok and aware

## 2014-05-02 NOTE — Telephone Encounter (Signed)
gv adn printed appt sched and avs for pt for June....sed added tx. °

## 2014-05-02 NOTE — Assessment & Plan Note (Signed)
The patient will continue conservative management under the care of her dentist. She remains on amoxicillin for this.

## 2014-05-02 NOTE — Progress Notes (Signed)
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    INTERVAL HISTORY: Please see below for problem oriented charting. She returns today prior to cycle 8 of treatment. Her main complaints consist of right neck pain.  REVIEW OF SYSTEMS:   Constitutional: Denies fevers, chills or abnormal weight loss Eyes: Denies blurriness of vision Ears, nose, mouth, throat, and face: Denies mucositis or sore throat Respiratory: Denies cough, dyspnea or wheezes Cardiovascular: Denies palpitation, chest discomfort or lower extremity swelling Gastrointestinal:  Denies nausea, heartburn or change in bowel habits Skin: Denies abnormal skin rashes Lymphatics: Denies new lymphadenopathy or easy bruising Neurological:Denies numbness, tingling or new weaknesses Behavioral/Psych: Mood is stable, no new changes  All other systems were reviewed with the patient and are negative.  I have reviewed the past medical history, past surgical history, social history and family history with the patient and they are unchanged from previous note.  ALLERGIES:  has No Known Allergies.  MEDICATIONS:  Current  Outpatient Prescriptions  Medication Sig Dispense Refill  . acyclovir (ZOVIRAX) 400 MG tablet Take 1 tablet (400 mg total) by mouth 2 (two) times daily.  60 tablet  6  . amoxicillin (AMOXIL) 500 MG capsule       . calcium carbonate (TUMS - DOSED IN MG ELEMENTAL CALCIUM) 500 MG chewable tablet Chew 1 tablet by mouth as needed for indigestion or heartburn.      . carboxymethylcellulose (REFRESH PLUS) 0.5 % SOLN 1 drop daily as needed.      . Cholecalciferol (VITAMIN D3) 2000 UNITS TABS Take 2,000 Units by mouth daily.      Marland Kitchen dexamethasone (DECADRON) 4 MG tablet TAKE 10 TABLETS BY MOUTH EVERY WEEK AS DIRECTED  100 tablet  2  . hydrochlorothiazide (HYDRODIURIL) 25 MG tablet Take 25 mg by mouth daily with breakfast.       . lenalidomide (REVLIMID) 15 MG capsule Take 1 capsule (15 mg total) by mouth daily. X 14 days on and 7 days off.  14 capsule  0  . levothyroxine (SYNTHROID, LEVOTHROID) 75 MCG tablet Take 75 mcg by mouth daily before breakfast.      . Multiple Vitamins-Minerals (CENTRUM SILVER PO) Take 1 tablet by mouth daily.       . polyethylene glycol (MIRALAX / GLYCOLAX) packet Take 17 g by mouth daily.      . prochlorperazine (COMPAZINE) 10 MG tablet Take 1 tablet (10 mg total) by mouth every 6 (six) hours as needed (Nausea or vomiting).  30 tablet  1  . rivaroxaban (XARELTO) 20 MG TABS tablet Take 1 tablet (20 mg total) by mouth daily with supper.  90 tablet  3  . traMADol (ULTRAM) 50 MG tablet Take 1 tablet (50 mg total) by  mouth every 6 (six) hours as needed for moderate pain or severe pain.  60 tablet  0  . venlafaxine XR (EFFEXOR-XR) 75 MG 24 hr capsule Take 1 capsule (75 mg total) by mouth 2 (two) times daily.  10 capsule  1  . verapamil (VERELAN PM) 120 MG 24 hr capsule Take 120 mg by mouth at bedtime.      . lidocaine-prilocaine (EMLA) cream Apply 1 application topically as needed (port).      Marland Kitchen oxyCODONE 10 MG TABS Take 1 tablet (10 mg total) by mouth every 4 (four) hours as needed for  severe pain.  60 tablet  0  . sulfamethoxazole-trimethoprim (BACTRIM DS,SEPTRA DS) 800-160 MG per tablet Take 1 tablet by mouth as directed. 1 tab twice a day on Mondays, Wednesday and Friday  60 tablet  1   No current facility-administered medications for this visit.   Facility-Administered Medications Ordered in Other Visits  Medication Dose Route Frequency Provider Last Rate Last Dose  . bortezomib SQ (VELCADE) chemo injection 2 mg  1 mg/m2 (Treatment Plan Actual) Subcutaneous Once Heath Lark, MD        PHYSICAL EXAMINATION: ECOG PERFORMANCE STATUS: 1 - Symptomatic but completely ambulatory  Filed Vitals:   05/02/14 0946  BP: 140/70  Pulse: 78  Temp: 98 F (36.7 C)  Resp: 18   Filed Weights   05/02/14 0946  Weight: 193 lb 11.2 oz (87.862 kg)    GENERAL:alert, no distress and comfortable. She is obese with cushingoid features SKIN: skin color, texture, turgor are normal, no rashes or significant lesions EYES: normal, Conjunctiva are pink and non-injected, sclera clear OROPHARYNX:no exudate, no erythema and lips, buccal mucosa, and tongue normal  NECK: supple, thyroid normal size, non-tender, without nodularity. She has fullness on both sides of the neck consistent with cushingoid features. Masses cannot be excluded. LYMPH:  no palpable lymphadenopathy in the cervical, axillary or inguinal LUNGS: clear to auscultation and percussion with normal breathing effort HEART: regular rate & rhythm and no murmurs and no lower extremity edema ABDOMEN:abdomen soft, non-tender and normal bowel sounds Musculoskeletal:no cyanosis of digits and no clubbing  NEURO: alert & oriented x 3 with fluent speech, no focal motor/sensory deficits  LABORATORY DATA:  I have reviewed the data as listed    Component Value Date/Time   NA 140 05/02/2014 0923   K 3.5 05/02/2014 0923   CO2 20* 05/02/2014 0923   GLUCOSE 212* 05/02/2014 0923   BUN 6.6* 05/02/2014 0923   CREATININE 0.8 05/02/2014 0923   CALCIUM 8.6  05/02/2014 0923   PROT 6.1* 05/02/2014 0923   ALBUMIN 3.1* 05/02/2014 0923   AST 18 05/02/2014 0923   ALT 28 05/02/2014 0923   ALKPHOS 52 05/02/2014 0923   BILITOT 0.46 05/02/2014 0923    No results found for this basename: SPEP, UPEP,  kappa and lambda light chains    Lab Results  Component Value Date   WBC 3.8* 05/02/2014   NEUTROABS 2.2 05/02/2014   HGB 10.2* 05/02/2014   HCT 33.3* 05/02/2014   MCV 79.3* 05/02/2014   PLT 209 05/02/2014      Chemistry      Component Value Date/Time   NA 140 05/02/2014 0923   K 3.5 05/02/2014 0923   CO2 20* 05/02/2014 0923   BUN 6.6* 05/02/2014 0923   CREATININE 0.8 05/02/2014 0923      Component Value Date/Time   CALCIUM 8.6 05/02/2014 0923   ALKPHOS 52 05/02/2014 0923   AST 18  05/02/2014 0923   ALT 28 05/02/2014 0923   BILITOT 0.46 05/02/2014 0923     ASSESSMENT & PLAN:  Multiple myeloma, without mention of having achieved remission She is experiencing no side effects from treatment so far. I recommend we proceed with treatment without dosage adjustment. She will continue her blood work and tests as required by her chemotherapy protocol.  Neck pain on right side This has been present for about a month. The neck pain is related to physical activity. It is interfering with sleep. Tramadol is not helping. I recommend a trial of oxycodone. I educated about side effects to be expected from oxycodone. It is on the same side as her Infuse-a-Port and her osteonecrosis of the jaw. I recommend we proceed with CT imaging study to exclude abscess or clot around her Infuse-a-Port.  DVT (deep venous thrombosis) She is doing well on anticoagulation therapy. She has no bleeding complications. Continue the same.  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 She remains on amoxicillin for this.  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 The patient denies recent history of bleeding such as  epistaxis, hematuria or hematochezia. She is asymptomatic from the anemia. I will observe for now.  She does not require transfusion now. I will continue the chemotherapy at current dose without dosage adjustment.  If the anemia gets progressive worse in the future, I might have to delay her treatment or adjust the chemotherapy dose.   Leukopenia due to antineoplastic chemotherapy This is likely due to recent treatment. The patient denies recent history of fevers, cough, chills, diarrhea or dysuria. She is asymptomatic from the leukopenia. I will observe for now.  I will continue the chemotherapy at current dose without dosage adjustment.  If the leukopenia gets progressive worse in the future, I might have to delay her treatment or adjust the chemotherapy dose.     Orders Placed This Encounter  Procedures  . CT Soft Tissue Neck W Contrast    Standing Status: Future     Number of Occurrences:      Standing Expiration Date: 08/02/2015    Order Specific Question:  Reason for Exam (SYMPTOM  OR DIAGNOSIS REQUIRED)    Answer:  right neck pain, recent jaw problem and DVT, fullness on the neck, diagnosis of myeloma    Order Specific Question:  Is the patient pregnant?    Answer:  No    Order Specific Question:  Preferred imaging location?    Answer:  [facility]   All questions were answered. The patient knows to call the clinic with any problems, questions or concerns. No barriers to learning was detected.    Heath Lark, MD 05/02/2014 10:26 AM

## 2014-05-02 NOTE — Progress Notes (Signed)
05/02/2014 Patient in to clinic today for evaluation prior to receiving treatment cycle 8. Primary complaint today is right neck/throat pain when swallowing. This is felt to be unrelated to protocol therapy, per Dr. Alvy Bimler. Plans made to proceed with CT evaluation in the next week, and to continue protocol therapy at this time. Based on lab results review and history and physical by Dr. Alvy Bimler, patient condition is acceptable for continued treatment. Patient reports that she is due to follow up with her dentist this week.  Patient reports ongoing mild fatigue, but generally feels that she is doing well. Patient returned completed cycle 7 Patient Medication Calendar, confirming correct dosing as prescribed. Blank cycle 8 Patient Medication Calendar was given to patient today for completion. Sign for injection was given to Ann Lions RN. [name] BSN, RN, [place] 05/02/2014 2:02 PM

## 2014-05-02 NOTE — Assessment & Plan Note (Signed)
This is likely due to recent treatment. The patient denies recent history of fevers, cough, chills, diarrhea or dysuria. She is asymptomatic from the leukopenia. I will observe for now.  I will continue the chemotherapy at current dose without dosage adjustment.  If the leukopenia gets progressive worse in the future, I might have to delay her treatment or adjust the chemotherapy dose.   

## 2014-05-03 ENCOUNTER — Telehealth: Payer: Self-pay | Admitting: *Deleted

## 2014-05-03 MED ORDER — DEXAMETHASONE 4 MG PO TABS: ORAL_TABLET | ORAL | Status: DC

## 2014-05-03 NOTE — Telephone Encounter (Signed)
Spoke with [facility] pharmacist Tan requesting that dexamethasone prescription that was inadvertently sent to [facility] by [facility] pharmacist be cancelled. Tan confirmed that prescription order has been cancelled and will not be filled. [name] BSN, RN, CCRP 05/03/2014 10:26 AM

## 2014-05-04 LAB — SPEP & IFE WITH QIG
Albumin ELP: 63.5 % (ref 55.8–66.1)
Alpha-1-Globulin: 4.3 % (ref 2.9–4.9)
Alpha-2-Globulin: 10.4 % (ref 7.1–11.8)
Beta 2: 3.8 % (ref 3.2–6.5)
Beta Globulin: 7.7 % — ABNORMAL HIGH (ref 4.7–7.2)
Gamma Globulin: 10.3 % — ABNORMAL LOW (ref 11.1–18.8)
IgA: 30 mg/dL — ABNORMAL LOW (ref 69–380)
IgG (Immunoglobin G), Serum: 742 mg/dL (ref 690–1700)
IgM, Serum: 35 mg/dL — ABNORMAL LOW (ref 52–322)
M-Spike, %: 0.29 g/dL
Total Protein, Serum Electrophoresis: 5.8 g/dL — ABNORMAL LOW (ref 6.0–8.3)

## 2014-05-04 LAB — KAPPA/LAMBDA LIGHT CHAINS
Kappa free light chain: 0.62 mg/dL (ref 0.33–1.94)
Kappa:Lambda Ratio: 0.4 (ref 0.26–1.65)
Lambda Free Lght Chn: 1.55 mg/dL (ref 0.57–2.63)

## 2014-05-05 ENCOUNTER — Ambulatory Visit (HOSPITAL_COMMUNITY)
Admission: RE | Admit: 2014-05-05 | Discharge: 2014-05-05 | Disposition: A | Discharge status: Home / Self Care | Payer: 59 | Source: Ambulatory Visit | Attending: Hematology and Oncology | Admitting: Hematology and Oncology

## 2014-05-05 ENCOUNTER — Encounter: Payer: Self-pay | Admitting: *Deleted

## 2014-05-05 ENCOUNTER — Ambulatory Visit (HOSPITAL_BASED_OUTPATIENT_CLINIC_OR_DEPARTMENT_OTHER): Payer: 59

## 2014-05-05 ENCOUNTER — Telehealth: Payer: Self-pay | Admitting: *Deleted

## 2014-05-05 VITALS — BP 128/83 | HR 74 | Temp 98.1°F | Resp 20

## 2014-05-05 DIAGNOSIS — M898X9 Other specified disorders of bone, unspecified site: Secondary | ICD-10-CM

## 2014-05-05 DIAGNOSIS — C9000 Multiple myeloma not having achieved remission: Secondary | ICD-10-CM

## 2014-05-05 DIAGNOSIS — M542 Cervicalgia: Secondary | ICD-10-CM

## 2014-05-05 DIAGNOSIS — Z5112 Encounter for antineoplastic immunotherapy: Secondary | ICD-10-CM

## 2014-05-05 DIAGNOSIS — R1310 Dysphagia, unspecified: Secondary | ICD-10-CM | POA: Insufficient documentation

## 2014-05-05 MED ORDER — IOHEXOL 300 MG/ML  SOLN
100.0000 mL | Freq: Once | INTRAMUSCULAR | Status: AC | PRN
  Administered 2014-05-05: 100 mL via INTRAVENOUS

## 2014-05-05 MED ORDER — ONDANSETRON HCL 8 MG PO TABS
ORAL_TABLET | ORAL | Status: AC
  Filled 2014-05-05: qty 1

## 2014-05-05 MED ORDER — ONDANSETRON HCL 8 MG PO TABS
8.0000 mg | ORAL_TABLET | Freq: Once | ORAL | Status: AC
Start: 2014-05-05 — End: 2014-05-05
  Administered 2014-05-05: 8 mg via ORAL

## 2014-05-05 MED ORDER — INV-BORTEZOMIB CHEMO SQ INJECTION 3.5 MG CTSU E1A11
1.0000 mg/m2 | Freq: Once | INTRAVENOUS | Status: AC
  Administered 2014-05-05: 2 mg via SUBCUTANEOUS
  Filled 2014-05-05: qty 2

## 2014-05-05 NOTE — Telephone Encounter (Signed)
Pt notified of results below 

## 2014-05-05 NOTE — Patient Instructions (Signed)
[place] Discharge Instructions for Patients Receiving Chemotherapy  Today you received the following chemotherapy agents velcade. Remember to take your decadron 10mg  when you get home this morning - with food.  To help prevent nausea and vomiting after your treatment, we encourage you to take your nausea medication compazine.   If you develop nausea and vomiting that is not controlled by your nausea medication, call the clinic.   BELOW ARE SYMPTOMS THAT SHOULD BE REPORTED IMMEDIATELY:  *FEVER GREATER THAN 100.5 F  *CHILLS WITH OR WITHOUT FEVER  NAUSEA AND VOMITING THAT IS NOT CONTROLLED WITH YOUR NAUSEA MEDICATION  *UNUSUAL SHORTNESS OF BREATH  *UNUSUAL BRUISING OR BLEEDING  TENDERNESS IN MOUTH AND THROAT WITH OR WITHOUT PRESENCE OF ULCERS  *URINARY PROBLEMS  *BOWEL PROBLEMS  UNUSUAL RASH Items with * indicate a potential emergency and should be followed up as soon as possible.  Feel free to call the clinic you have any questions or concerns. The clinic phone number is [phone].

## 2014-05-05 NOTE — Progress Notes (Signed)
Patient will take decadron 10mg  at home this am.  She did not take it before coming as she was NPO for 4hrs prior to radiology appt.

## 2014-05-05 NOTE — Telephone Encounter (Signed)
Message copied by Patton Salles on Fri May 05, 2014 12:20 PM ------      Message from: [facility], [place]      Created: Fri May 05, 2014 10:28 AM      Regarding: test result       OK to let her know Ct neck is normal. No blood clots or neck abscess.      Continue pain meds as needed      ----- Message -----         From: Rad Results In Interface         Sent: 05/05/2014   9:44 AM           To: Heath Lark, MD                   ------

## 2014-05-09 ENCOUNTER — Encounter: Payer: 59 | Admitting: *Deleted

## 2014-05-09 ENCOUNTER — Ambulatory Visit (HOSPITAL_BASED_OUTPATIENT_CLINIC_OR_DEPARTMENT_OTHER): Payer: 59

## 2014-05-09 ENCOUNTER — Other Ambulatory Visit (HOSPITAL_BASED_OUTPATIENT_CLINIC_OR_DEPARTMENT_OTHER): Payer: 59

## 2014-05-09 ENCOUNTER — Other Ambulatory Visit: Payer: Self-pay | Admitting: Hematology and Oncology

## 2014-05-09 VITALS — BP 118/70 | HR 86 | Temp 97.4°F | Resp 20

## 2014-05-09 DIAGNOSIS — C9000 Multiple myeloma not having achieved remission: Secondary | ICD-10-CM

## 2014-05-09 DIAGNOSIS — Z5112 Encounter for antineoplastic immunotherapy: Secondary | ICD-10-CM

## 2014-05-09 LAB — CBC WITH DIFFERENTIAL/PLATELET
BASO%: 0 % (ref 0.0–2.0)
Basophils Absolute: 0 10*3/uL (ref 0.0–0.1)
EOS%: 6.6 % (ref 0.0–7.0)
Eosinophils Absolute: 0.3 10*3/uL (ref 0.0–0.5)
HCT: 34.4 % — ABNORMAL LOW (ref 34.8–46.6)
HGB: 10.9 g/dL — ABNORMAL LOW (ref 11.6–15.9)
LYMPH%: 28.9 % (ref 14.0–49.7)
MCH: 24.7 pg — ABNORMAL LOW (ref 25.1–34.0)
MCHC: 31.7 g/dL (ref 31.5–36.0)
MCV: 78 fL — ABNORMAL LOW (ref 79.5–101.0)
MONO#: 0.3 10*3/uL (ref 0.1–0.9)
MONO%: 7.2 % (ref 0.0–14.0)
NEUT#: 2.2 10*3/uL (ref 1.5–6.5)
NEUT%: 57.3 % (ref 38.4–76.8)
Platelets: 165 10*3/uL (ref 145–400)
RBC: 4.41 10*6/uL (ref 3.70–5.45)
RDW: 16.5 % — ABNORMAL HIGH (ref 11.2–14.5)
WBC: 3.8 10*3/uL — ABNORMAL LOW (ref 3.9–10.3)
lymph#: 1.1 10*3/uL (ref 0.9–3.3)
nRBC: 1 % — ABNORMAL HIGH (ref 0–0)

## 2014-05-09 MED ORDER — INV-BORTEZOMIB CHEMO SQ INJECTION 3.5 MG CTSU E1A11
1.0000 mg/m2 | Freq: Once | INTRAVENOUS | Status: AC
  Administered 2014-05-09: 2 mg via SUBCUTANEOUS
  Filled 2014-05-09: qty 2

## 2014-05-09 MED ORDER — ONDANSETRON HCL 8 MG PO TABS
ORAL_TABLET | ORAL | Status: AC
  Filled 2014-05-09: qty 1

## 2014-05-09 MED ORDER — ONDANSETRON HCL 8 MG PO TABS
8.0000 mg | ORAL_TABLET | Freq: Once | ORAL | Status: AC
  Administered 2014-05-09: 8 mg via ORAL

## 2014-05-09 NOTE — Patient Instructions (Signed)
[place] Discharge Instructions for Patients Receiving Chemotherapy  Today you received the following chemotherapy agents velcade.  To help prevent nausea and vomiting after your treatment, we encourage you to take your nausea medication compazine.   If you develop nausea and vomiting that is not controlled by your nausea medication, call the clinic.   BELOW ARE SYMPTOMS THAT SHOULD BE REPORTED IMMEDIATELY:  *FEVER GREATER THAN 100.5 F  *CHILLS WITH OR WITHOUT FEVER  NAUSEA AND VOMITING THAT IS NOT CONTROLLED WITH YOUR NAUSEA MEDICATION  *UNUSUAL SHORTNESS OF BREATH  *UNUSUAL BRUISING OR BLEEDING  TENDERNESS IN MOUTH AND THROAT WITH OR WITHOUT PRESENCE OF ULCERS  *URINARY PROBLEMS  *BOWEL PROBLEMS  UNUSUAL RASH Items with * indicate a potential emergency and should be followed up as soon as possible.  Feel free to call the clinic you have any questions or concerns. The clinic phone number is [phone].

## 2014-05-12 ENCOUNTER — Encounter: Payer: Self-pay | Admitting: *Deleted

## 2014-05-12 ENCOUNTER — Other Ambulatory Visit: Payer: 59

## 2014-05-12 ENCOUNTER — Ambulatory Visit (HOSPITAL_BASED_OUTPATIENT_CLINIC_OR_DEPARTMENT_OTHER): Payer: 59

## 2014-05-12 VITALS — BP 135/71 | HR 86 | Temp 98.0°F | Resp 20

## 2014-05-12 DIAGNOSIS — C9000 Multiple myeloma not having achieved remission: Secondary | ICD-10-CM

## 2014-05-12 DIAGNOSIS — Z5112 Encounter for antineoplastic immunotherapy: Secondary | ICD-10-CM

## 2014-05-12 MED ORDER — ONDANSETRON HCL 8 MG PO TABS
8.0000 mg | ORAL_TABLET | Freq: Once | ORAL | Status: AC
  Administered 2014-05-12: 8 mg via ORAL

## 2014-05-12 MED ORDER — INV-BORTEZOMIB CHEMO SQ INJECTION 3.5 MG CTSU E1A11
1.0000 mg/m2 | Freq: Once | INTRAVENOUS | Status: AC
  Administered 2014-05-12: 2 mg via SUBCUTANEOUS
  Filled 2014-05-12: qty 2

## 2014-05-12 MED ORDER — ONDANSETRON HCL 8 MG PO TABS
ORAL_TABLET | ORAL | Status: AC
  Filled 2014-05-12: qty 1

## 2014-05-12 NOTE — Progress Notes (Signed)
05/12/2014 Patient in to clinic today for Day 11 injection. She states that she is feeling well. Explained to patient that we are working on a new dexamethasone prescription reflecting the new dosage that will be effective with the start of Cycle 9 on 05/23/2014. Patient voiced understanding. Plan is to work on completing and updating the take-home medication order so that it will be available for her to pick up at her local [facility] next Tuesday. Patient is aware that current dosing is in effect for the rest of this cycle, e.g. dexamethasone will be taken today and tomorrow as previously scheduled. Sign for injection given to [facility] RN. [name] BSN, RN, [place] 05/12/2014 10:51 AM

## 2014-05-12 NOTE — Progress Notes (Signed)
05/09/2014 Patient in to clinic today for Day 8 injection. Based on lab results review, patient condition is acceptable for receiving Velcade injection today. Sign for injection given to Royce Macadamia RN. [name] BSN, RN, [place] 05/12/2014 10:53 AM

## 2014-05-12 NOTE — Patient Instructions (Signed)
[place] Discharge Instructions for Patients Receiving Chemotherapy  Today you received the following chemotherapy agents VELCADE SQ  To help prevent nausea and vomiting after your treatment, we encourage you to take your nausea medication IF NEEDED.   If you develop nausea and vomiting that is not controlled by your nausea medication, call the clinic.   BELOW ARE SYMPTOMS THAT SHOULD BE REPORTED IMMEDIATELY:  *FEVER GREATER THAN 100.5 F  *CHILLS WITH OR WITHOUT FEVER  NAUSEA AND VOMITING THAT IS NOT CONTROLLED WITH YOUR NAUSEA MEDICATION  *UNUSUAL SHORTNESS OF BREATH  *UNUSUAL BRUISING OR BLEEDING  TENDERNESS IN MOUTH AND THROAT WITH OR WITHOUT PRESENCE OF ULCERS  *URINARY PROBLEMS  *BOWEL PROBLEMS  UNUSUAL RASH Items with * indicate a potential emergency and should be followed up as soon as possible.  Feel free to call the clinic you have any questions or concerns. The clinic phone number is [phone].

## 2014-05-15 ENCOUNTER — Other Ambulatory Visit: Payer: Self-pay | Admitting: *Deleted

## 2014-05-15 ENCOUNTER — Telehealth: Payer: Self-pay

## 2014-05-15 ENCOUNTER — Other Ambulatory Visit: Payer: Self-pay

## 2014-05-15 DIAGNOSIS — C9000 Multiple myeloma not having achieved remission: Secondary | ICD-10-CM

## 2014-05-15 MED ORDER — DEXAMETHASONE 4 MG PO TABS: ORAL_TABLET | ORAL | Status: DC

## 2014-05-15 NOTE — Telephone Encounter (Signed)
Prescription refill request forwarded to Dr Calton Dach nurse

## 2014-05-15 NOTE — Telephone Encounter (Signed)
revlimid refill request forwarded to Dr Calton Dach nurse

## 2014-05-16 ENCOUNTER — Other Ambulatory Visit: Payer: Self-pay | Admitting: *Deleted

## 2014-05-16 ENCOUNTER — Telehealth: Payer: Self-pay | Admitting: *Deleted

## 2014-05-16 MED ORDER — LENALIDOMIDE 15 MG PO CAPS: 15.0000 mg | ORAL_CAPSULE | Freq: Every day | ORAL | Status: DC

## 2014-05-16 NOTE — Telephone Encounter (Signed)
05/16/2014 Per pharmacist Sunday Spillers at [facility] [phone]), a refill of the dexamethasone labelled 10 tablets weekly was filled on 5/31 and picked up on 6/4. This bottle contained #40 tablets. Per insurance requirements (28 days supply), the new e-prescription sent on 6/15 with start date of 6/23 cannot be filled until June 29th.  Spoke with patient by phone today regarding Decadron prescription. She states that she has already begun taking tablets from the bottle picked up on 6/4, and therefore would not have the full number of tablets left to cover cycles 9-12 with reduced dosing of Decadron.  Plans made for patient to pick up next prescription at the end of the month, and that this will be the last prescription she should need to complete therapy. Patient will discontinue auto-refill on the dexamethasone so that she will not have additional unneeded refills.  [name] BSN, RN, CCRP 05/16/2014 10:43 AM

## 2014-05-17 NOTE — Progress Notes (Signed)
05/05/2014 Patient in to clinic today for Day 4 treatment. Sign for injection given to Royce Macadamia RN. [name] BSN, RN, [place] 05/05/2014 4:23 PM

## 2014-05-19 ENCOUNTER — Ambulatory Visit: Payer: 59

## 2014-05-19 ENCOUNTER — Other Ambulatory Visit: Payer: 59

## 2014-05-22 ENCOUNTER — Other Ambulatory Visit: Payer: Self-pay | Admitting: Neurology

## 2014-05-23 ENCOUNTER — Encounter: Payer: 59 | Admitting: *Deleted

## 2014-05-23 ENCOUNTER — Telehealth: Payer: Self-pay | Admitting: Hematology and Oncology

## 2014-05-23 ENCOUNTER — Other Ambulatory Visit (HOSPITAL_BASED_OUTPATIENT_CLINIC_OR_DEPARTMENT_OTHER): Payer: 59

## 2014-05-23 ENCOUNTER — Encounter: Payer: Self-pay | Admitting: Hematology and Oncology

## 2014-05-23 ENCOUNTER — Ambulatory Visit (HOSPITAL_BASED_OUTPATIENT_CLINIC_OR_DEPARTMENT_OTHER): Payer: 59

## 2014-05-23 ENCOUNTER — Other Ambulatory Visit: Payer: Self-pay | Admitting: *Deleted

## 2014-05-23 ENCOUNTER — Telehealth: Payer: Self-pay | Admitting: *Deleted

## 2014-05-23 ENCOUNTER — Ambulatory Visit: Payer: 59

## 2014-05-23 ENCOUNTER — Ambulatory Visit (HOSPITAL_BASED_OUTPATIENT_CLINIC_OR_DEPARTMENT_OTHER): Payer: 59 | Admitting: Hematology and Oncology

## 2014-05-23 VITALS — BP 125/78 | HR 86 | Temp 97.0°F | Resp 18 | Ht 67.0 in | Wt 194.6 lb

## 2014-05-23 DIAGNOSIS — M8710 Osteonecrosis due to drugs, unspecified bone: Secondary | ICD-10-CM

## 2014-05-23 DIAGNOSIS — Z95828 Presence of other vascular implants and grafts: Secondary | ICD-10-CM

## 2014-05-23 DIAGNOSIS — I82401 Acute embolism and thrombosis of unspecified deep veins of right lower extremity: Secondary | ICD-10-CM

## 2014-05-23 DIAGNOSIS — C9000 Multiple myeloma not having achieved remission: Secondary | ICD-10-CM

## 2014-05-23 DIAGNOSIS — K921 Melena: Secondary | ICD-10-CM

## 2014-05-23 DIAGNOSIS — I82409 Acute embolism and thrombosis of unspecified deep veins of unspecified lower extremity: Secondary | ICD-10-CM

## 2014-05-23 DIAGNOSIS — D630 Anemia in neoplastic disease: Secondary | ICD-10-CM

## 2014-05-23 DIAGNOSIS — Z5112 Encounter for antineoplastic immunotherapy: Secondary | ICD-10-CM

## 2014-05-23 DIAGNOSIS — M8708 Idiopathic aseptic necrosis of bone, other site: Secondary | ICD-10-CM

## 2014-05-23 DIAGNOSIS — T50905A Adverse effect of unspecified drugs, medicaments and biological substances, initial encounter: Secondary | ICD-10-CM | POA: Insufficient documentation

## 2014-05-23 LAB — COMPREHENSIVE METABOLIC PANEL (CC13)
ALT: 27 U/L (ref 0–55)
AST: 19 U/L (ref 5–34)
Albumin: 3.3 g/dL — ABNORMAL LOW (ref 3.5–5.0)
Alkaline Phosphatase: 49 U/L (ref 40–150)
Anion Gap: 7 mEq/L (ref 3–11)
BUN: 6.9 mg/dL — ABNORMAL LOW (ref 7.0–26.0)
CO2: 29 mEq/L (ref 22–29)
Calcium: 9.4 mg/dL (ref 8.4–10.4)
Chloride: 105 mEq/L (ref 98–109)
Creatinine: 0.8 mg/dL (ref 0.6–1.1)
Glucose: 157 mg/dl — ABNORMAL HIGH (ref 70–140)
Potassium: 3.7 mEq/L (ref 3.5–5.1)
Sodium: 141 mEq/L (ref 136–145)
Total Bilirubin: 0.4 mg/dL (ref 0.20–1.20)
Total Protein: 6.2 g/dL — ABNORMAL LOW (ref 6.4–8.3)

## 2014-05-23 LAB — CBC WITH DIFFERENTIAL/PLATELET
BASO%: 0.7 % (ref 0.0–2.0)
Basophils Absolute: 0 10*3/uL (ref 0.0–0.1)
EOS%: 0.8 % (ref 0.0–7.0)
Eosinophils Absolute: 0 10*3/uL (ref 0.0–0.5)
HCT: 33.5 % — ABNORMAL LOW (ref 34.8–46.6)
HGB: 10.3 g/dL — ABNORMAL LOW (ref 11.6–15.9)
LYMPH%: 15.7 % (ref 14.0–49.7)
MCH: 24.6 pg — ABNORMAL LOW (ref 25.1–34.0)
MCHC: 30.9 g/dL — ABNORMAL LOW (ref 31.5–36.0)
MCV: 79.8 fL (ref 79.5–101.0)
MONO#: 0.4 10*3/uL (ref 0.1–0.9)
MONO%: 10.7 % (ref 0.0–14.0)
NEUT#: 2.8 10*3/uL (ref 1.5–6.5)
NEUT%: 72.1 % (ref 38.4–76.8)
Platelets: 211 10*3/uL (ref 145–400)
RBC: 4.2 10*6/uL (ref 3.70–5.45)
RDW: 16.8 % — ABNORMAL HIGH (ref 11.2–14.5)
WBC: 3.9 10*3/uL (ref 3.9–10.3)
lymph#: 0.6 10*3/uL — ABNORMAL LOW (ref 0.9–3.3)

## 2014-05-23 LAB — LACTATE DEHYDROGENASE (CC13): LDH: 271 U/L — ABNORMAL HIGH (ref 125–245)

## 2014-05-23 MED ORDER — ONDANSETRON HCL 8 MG PO TABS
ORAL_TABLET | ORAL | Status: AC
  Filled 2014-05-23: qty 1

## 2014-05-23 MED ORDER — INV-BORTEZOMIB CHEMO SQ INJECTION 3.5 MG CTSU E1A11
1.0000 mg/m2 | Freq: Once | INTRAVENOUS | Status: AC
  Administered 2014-05-23: 2 mg via SUBCUTANEOUS
  Filled 2014-05-23: qty 2

## 2014-05-23 MED ORDER — ONDANSETRON HCL 8 MG PO TABS
8.0000 mg | ORAL_TABLET | Freq: Once | ORAL | Status: AC
  Administered 2014-05-23: 8 mg via ORAL

## 2014-05-23 MED ORDER — SODIUM CHLORIDE 0.9 % IJ SOLN
10.0000 mL | INTRAMUSCULAR | Status: DC | PRN
Start: 2014-05-23 — End: 2014-05-23
  Administered 2014-05-23: 10 mL via INTRAVENOUS
  Filled 2014-05-23: qty 10

## 2014-05-23 MED ORDER — HEPARIN SOD (PORK) LOCK FLUSH 100 UNIT/ML IV SOLN
500.0000 [IU] | Freq: Once | INTRAVENOUS | Status: AC
Start: 2014-05-23 — End: 2014-05-23
  Administered 2014-05-23: 500 [IU] via INTRAVENOUS
  Filled 2014-05-23: qty 5

## 2014-05-23 NOTE — Telephone Encounter (Signed)
Gave pt appt for lab,md and chemo for July 2015 °

## 2014-05-23 NOTE — Progress Notes (Signed)
05/23/2014  

## 2014-05-23 NOTE — Assessment & Plan Note (Signed)
Due to mild bleeding, I told her to hold Xarelto for 1 week before resumption.

## 2014-05-23 NOTE — Patient Instructions (Signed)
[facility] Discharge Instructions for Patients Receiving Chemotherapy  Today you received the following chemotherapy agents Velcade.  To help prevent nausea and vomiting after your treatment, we encourage you to take your nausea medication as directed.    If you develop nausea and vomiting that is not controlled by your nausea medication, call the clinic.   BELOW ARE SYMPTOMS THAT SHOULD BE REPORTED IMMEDIATELY:  *FEVER GREATER THAN 100.5 F  *CHILLS WITH OR WITHOUT FEVER  NAUSEA AND VOMITING THAT IS NOT CONTROLLED WITH YOUR NAUSEA MEDICATION  *UNUSUAL SHORTNESS OF BREATH  *UNUSUAL BRUISING OR BLEEDING  TENDERNESS IN MOUTH AND THROAT WITH OR WITHOUT PRESENCE OF ULCERS  *URINARY PROBLEMS  *BOWEL PROBLEMS  UNUSUAL RASH Items with * indicate a potential emergency and should be followed up as soon as possible.  Feel free to call the clinic you have any questions or concerns. The clinic phone number is [phone].    

## 2014-05-23 NOTE — Assessment & Plan Note (Signed)

## 2014-05-23 NOTE — Assessment & Plan Note (Signed)
This is improving. Continue supportive care and continue to hold Zometa.

## 2014-05-23 NOTE — Assessment & Plan Note (Signed)
The patient continued to do well on her chemotherapy. Per research trial, we'll continue treatment without dosage adjustment. Her dexamethasone is currently reduced to 10 mg per protocol. She will continue on acyclovir prophylaxis and has resumed Bactrim. Bone marrow biopsy would be due in 2 months.

## 2014-05-23 NOTE — Progress Notes (Signed)
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   05/05/2014 Imaging Imaging study of the neck showed no explanation that could cause right neck pain. She is noted to have incidental left upper lung nodule. Plan to repeat imaging study in 3 months.    INTERVAL HISTORY: Please see below for problem oriented charting. Her only main complaint today is mild intermittent rectal bleeding. 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   REVIEW OF SYSTEMS:   Constitutional: Denies fevers, chills or abnormal weight loss Eyes: Denies blurriness of vision Ears, nose, mouth, throat, and face: Denies mucositis or sore throat Respiratory: Denies cough, dyspnea or wheezes Cardiovascular: Denies palpitation, chest discomfort or lower extremity swelling Gastrointestinal:  Denies nausea, heartburn or change in bowel habits Skin: Denies abnormal skin rashes Lymphatics: Denies new lymphadenopathy or easy bruising Neurological:Denies numbness, tingling or new weaknesses Behavioral/Psych: Mood is stable, no new changes  All other systems were reviewed with the patient and are negative.  I have reviewed the  past medical history, past surgical history, social history and family history with the patient and they are unchanged from previous note.  ALLERGIES:  has No Known Allergies.  MEDICATIONS:  Current Outpatient Prescriptions  Medication Sig Dispense Refill  . calcium carbonate (TUMS - DOSED IN MG ELEMENTAL CALCIUM) 500 MG chewable tablet Chew 1 tablet by mouth as needed for indigestion or heartburn.      . carboxymethylcellulose (REFRESH PLUS) 0.5 % SOLN 1 drop daily as needed.      . Cholecalciferol (VITAMIN D3) 2000 UNITS TABS Take 2,000 Units by mouth daily.      Marland Kitchen dexamethasone (DECADRON) 4 MG tablet Take 2 & 1/2 tab (10 mg) on days 1,2,8,9 every 21 days for cycles 9-12.  40 tablet  0  . hydrochlorothiazide (HYDRODIURIL) 25 MG tablet Take 25 mg by mouth daily with breakfast.       . lenalidomide (REVLIMID) 15 MG capsule Take 1 capsule (15 mg total) by mouth daily. X 14 days on and 7 days off.  14 capsule  0  . levothyroxine (SYNTHROID, LEVOTHROID) 75 MCG tablet Take 75 mcg by mouth daily before breakfast.      . lidocaine-prilocaine (EMLA) cream Apply 1 application topically as needed (port).      . Multiple Vitamins-Minerals (CENTRUM SILVER PO) Take 1 tablet by mouth daily.       Marland Kitchen oxyCODONE 10 MG TABS Take 1 tablet (10 mg total) by mouth every 4 (four) hours as needed for severe pain.  60 tablet  0  . polyethylene glycol (MIRALAX / GLYCOLAX) packet Take 17 g by mouth daily.      . prochlorperazine (COMPAZINE) 10 MG tablet Take 1 tablet (10 mg total) by mouth every  6 (six) hours as needed (Nausea or vomiting).  30 tablet  1  . rivaroxaban (XARELTO) 20 MG TABS tablet Take 1 tablet (20 mg total) by mouth daily with supper.  90 tablet  3  . sulfamethoxazole-trimethoprim (BACTRIM DS,SEPTRA DS) 800-160 MG per tablet Take 1 tablet by mouth as directed. 1 tab twice a day on Mondays, Wednesday and Friday  60 tablet  1  . traMADol (ULTRAM) 50 MG tablet Take 1 tablet (50 mg total) by mouth every 6  (six) hours as needed for moderate pain or severe pain.  60 tablet  0  . venlafaxine XR (EFFEXOR-XR) 75 MG 24 hr capsule Take 1 capsule (75 mg total) by mouth 2 (two) times daily.  10 capsule  1  . venlafaxine XR (EFFEXOR-XR) 75 MG 24 hr capsule Take 1 capsule (75 mg  total) by mouth 2 (two)  times daily.  180 capsule  0  . verapamil (VERELAN PM) 120 MG 24 hr capsule Take 120 mg by mouth at bedtime.      Marland Kitchen acyclovir (ZOVIRAX) 400 MG tablet Take 1 tablet (400 mg total) by mouth 2 (two) times daily.  60 tablet  6  . amoxicillin (AMOXIL) 500 MG capsule        No current facility-administered medications for this visit.    PHYSICAL EXAMINATION: ECOG PERFORMANCE STATUS: 1 - Symptomatic but completely ambulatory  Filed Vitals:   05/23/14 1101  BP: 125/78  Pulse: 86  Temp: 97 F (36.1 C)  Resp: 18   Filed Weights   05/23/14 1101  Weight: 194 lb 9.6 oz (88.27 kg)    GENERAL:alert, no distress and comfortable. She looked mildly cushingoid  SKIN: skin color, texture, turgor are normal, no rashes or significant lesions EYES: normal, Conjunctiva are pink and non-injected, sclera clear OROPHARYNX:no exudate, no erythema and lips, buccal mucosa, and tongue normal . Persistent osteonecrosis of the jaw is noted.  NECK: supple, thyroid normal size, non-tender, without nodularity LYMPH:  no palpable lymphadenopathy in the cervical, axillary or inguinal LUNGS: clear to auscultation and percussion with normal breathing effort HEART: regular rate & rhythm and no murmurs and no lower extremity edema ABDOMEN:abdomen soft, non-tender and normal bowel sounds Musculoskeletal:no cyanosis of digits and no clubbing  NEURO: alert & oriented x 3 with fluent speech, no focal motor/sensory deficits  LABORATORY DATA:  I have reviewed the data as listed    Component Value Date/Time   NA 141 05/23/2014 1034   K 3.7 05/23/2014 1034   CO2 29 05/23/2014 1034   GLUCOSE 157* 05/23/2014 1034   BUN 6.9* 05/23/2014  1034   CREATININE 0.8 05/23/2014 1034   CALCIUM 9.4 05/23/2014 1034   PROT 6.2* 05/23/2014 1034   ALBUMIN 3.3* 05/23/2014 1034   AST 19 05/23/2014 1034   ALT 27 05/23/2014 1034   ALKPHOS 49 05/23/2014 1034   BILITOT 0.40 05/23/2014 1034    No results found for this basename: SPEP, UPEP,  kappa and lambda light chains    Lab Results  Component Value Date   WBC 3.9 05/23/2014   NEUTROABS 2.8 05/23/2014   HGB 10.3* 05/23/2014   HCT 33.5* 05/23/2014   MCV 79.8 05/23/2014   PLT 211 05/23/2014      Chemistry      Component Value Date/Time   NA 141 05/23/2014 1034   K 3.7 05/23/2014 1034   CO2 29 05/23/2014 1034   BUN 6.9* 05/23/2014 1034   CREATININE 0.8 05/23/2014 1034  Component Value Date/Time   CALCIUM 9.4 05/23/2014 1034   ALKPHOS 49 05/23/2014 1034   AST 19 05/23/2014 1034   ALT 27 05/23/2014 1034   BILITOT 0.40 05/23/2014 1034      ASSESSMENT & PLAN:  Multiple myeloma, without mention of having achieved remission The patient continued to do well on her chemotherapy. Per research trial, we'll continue treatment without dosage adjustment. Her dexamethasone is currently reduced to 10 mg per protocol. She will continue on acyclovir prophylaxis and has resumed Bactrim. Bone marrow biopsy would be due in 2 months.  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 Continue supportive care and continue to hold Zometa.  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 The patient denies recent history of bleeding such as epistaxis, hematuria or hematochezia. She is asymptomatic from the anemia. I will observe for now.  She does not require transfusion now. I will continue the chemotherapy at current dose without dosage adjustment.  If the anemia gets progressive worse in the future, I might have to delay her treatment or adjust the chemotherapy dose.     DVT (deep venous thrombosis) Due to mild bleeding, I told her to hold Xarelto for 1 week before  resumption.   Hematochezia due to medication Intermittent, likely due to her anticoagulation therapy. I told to hold her anticoagulation therapy for 1 week before resuming medications, provided that her symptoms resolved. If it does not resolve, we might have to reduce the dose of her treatment.   No orders of the defined types were placed in this encounter.   All questions were answered. The patient knows to call the clinic with any problems, questions or concerns. No barriers to learning was detected. I spent 25 minutes counseling the patient face to face. The total time spent in the appointment was 30 minutes and more than 50% was on counseling and review of test results     [facility], [place], MD 05/23/2014 1:28 PM

## 2014-05-23 NOTE — Assessment & Plan Note (Signed)
Intermittent, likely due to her anticoagulation therapy. I told to hold her anticoagulation therapy for 1 week before resuming medications, provided that her symptoms resolved. If it does not resolve, we might have to reduce the dose of her treatment.

## 2014-05-23 NOTE — Telephone Encounter (Signed)
Per staff phone call and POF I have schedueld appts. Scheduler advised of appts.  JMW  

## 2014-05-23 NOTE — Progress Notes (Signed)
05/23/2014 Patient in to clinic today for evaluation prior to receiving treatment Cycle 9. Upon arrival to clinic, prior to interactions with health care providers, patient completed her patient reported outcomes questionnaires independently. Patient returned her completed cycle 8 Patient Medication Calendar, confirming correct dosing as prescribed. Cycle 9 Patient Medication Calendar was given to patient today for completion. Patient reports sore throat and mild nausea at the beginning of this cycle. She again experienced grade 1 eye sensitivity to light mid-cycle. She feels her peripheral neuropathy is unchanged from baseline, and does not interfere with ADLs. Based on lab results review and history and physical by Dr. Alvy Bimler, patient condition is acceptable for continued treatment. Sign for injection given to [name] RN. Patient states that she was able to pick up her new prescription for Decadron last week at [facility], with the new dosing regimen beginning in Cycle 9. She states the bottle has #30 or #40 tablets, and with the leftover doses from her current bottle, she will have enough to take through the end of Cycle 12 treatment. [name] BSN, RN, [place] 05/23/2014 12:58 PM

## 2014-05-23 NOTE — Patient Instructions (Signed)

## 2014-05-25 LAB — SPEP & IFE WITH QIG
Albumin ELP: 62.9 % (ref 55.8–66.1)
Alpha-1-Globulin: 4.5 % (ref 2.9–4.9)
Alpha-2-Globulin: 10.6 % (ref 7.1–11.8)
Beta 2: 5 % (ref 3.2–6.5)
Beta Globulin: 6.8 % (ref 4.7–7.2)
Gamma Globulin: 10.2 % — ABNORMAL LOW (ref 11.1–18.8)
IgA: 33 mg/dL — ABNORMAL LOW (ref 69–380)
IgG (Immunoglobin G), Serum: 675 mg/dL — ABNORMAL LOW (ref 690–1700)
IgM, Serum: 44 mg/dL — ABNORMAL LOW (ref 52–322)
M-Spike, %: 0.26 g/dL
Total Protein, Serum Electrophoresis: 6 g/dL (ref 6.0–8.3)

## 2014-05-25 LAB — KAPPA/LAMBDA LIGHT CHAINS
Kappa free light chain: 0.73 mg/dL (ref 0.33–1.94)
Kappa:Lambda Ratio: 0.46 (ref 0.26–1.65)
Lambda Free Lght Chn: 1.6 mg/dL (ref 0.57–2.63)

## 2014-05-26 ENCOUNTER — Ambulatory Visit: Payer: 59

## 2014-05-26 ENCOUNTER — Telehealth: Payer: Self-pay | Admitting: *Deleted

## 2014-05-26 NOTE — Telephone Encounter (Signed)
Spoke with patient by phone to let her know that we would like to collect a 24-hour urine sample to confirm the results of this week's blood test. Patient will pick up container at next week's lab appointment, and will likely begin the collection the morning of Wednesay, 7/1, so that she can return the sample when completed the morning of Thursday 7/2. [name] BSN, RN, [facility] 05/26/2014 3:38 PM

## 2014-05-30 ENCOUNTER — Ambulatory Visit (HOSPITAL_BASED_OUTPATIENT_CLINIC_OR_DEPARTMENT_OTHER): Payer: 59

## 2014-05-30 ENCOUNTER — Other Ambulatory Visit (HOSPITAL_BASED_OUTPATIENT_CLINIC_OR_DEPARTMENT_OTHER): Payer: 59

## 2014-05-30 VITALS — BP 131/67 | HR 87 | Temp 98.7°F | Resp 16

## 2014-05-30 DIAGNOSIS — C9000 Multiple myeloma not having achieved remission: Secondary | ICD-10-CM

## 2014-05-30 DIAGNOSIS — Z5112 Encounter for antineoplastic immunotherapy: Secondary | ICD-10-CM

## 2014-05-30 LAB — CBC WITH DIFFERENTIAL/PLATELET
BASO%: 0.9 % (ref 0.0–2.0)
Basophils Absolute: 0 10*3/uL (ref 0.0–0.1)
EOS%: 6.4 % (ref 0.0–7.0)
Eosinophils Absolute: 0.2 10*3/uL (ref 0.0–0.5)
HCT: 34.8 % (ref 34.8–46.6)
HGB: 10.8 g/dL — ABNORMAL LOW (ref 11.6–15.9)
LYMPH%: 31 % (ref 14.0–49.7)
MCH: 24.7 pg — ABNORMAL LOW (ref 25.1–34.0)
MCHC: 31.1 g/dL — ABNORMAL LOW (ref 31.5–36.0)
MCV: 79.3 fL — ABNORMAL LOW (ref 79.5–101.0)
MONO#: 0.2 10*3/uL (ref 0.1–0.9)
MONO%: 9.5 % (ref 0.0–14.0)
NEUT#: 1.3 10*3/uL — ABNORMAL LOW (ref 1.5–6.5)
NEUT%: 52.2 % (ref 38.4–76.8)
Platelets: 188 10*3/uL (ref 145–400)
RBC: 4.39 10*6/uL (ref 3.70–5.45)
RDW: 17.2 % — ABNORMAL HIGH (ref 11.2–14.5)
WBC: 2.6 10*3/uL — ABNORMAL LOW (ref 3.9–10.3)
lymph#: 0.8 10*3/uL — ABNORMAL LOW (ref 0.9–3.3)

## 2014-05-30 MED ORDER — ONDANSETRON HCL 8 MG PO TABS
ORAL_TABLET | ORAL | Status: AC
  Filled 2014-05-30: qty 1

## 2014-05-30 MED ORDER — ONDANSETRON HCL 8 MG PO TABS
8.0000 mg | ORAL_TABLET | Freq: Once | ORAL | Status: AC
  Administered 2014-05-30: 8 mg via ORAL

## 2014-05-30 MED ORDER — INV-BORTEZOMIB CHEMO SQ INJECTION 3.5 MG CTSU E1A11
1.0000 mg/m2 | Freq: Once | INTRAVENOUS | Status: AC
  Administered 2014-05-30: 2 mg via SUBCUTANEOUS
  Filled 2014-05-30: qty 2

## 2014-05-30 NOTE — Patient Instructions (Signed)
[facility] Discharge Instructions for Patients Receiving Chemotherapy  Today you received the following chemotherapy agents: Velcade. To help prevent nausea and vomiting after your treatment, we encourage you to take your nausea medication.  If you develop nausea and vomiting that is not controlled by your nausea medication, call the clinic.   BELOW ARE SYMPTOMS THAT SHOULD BE REPORTED IMMEDIATELY:  *FEVER GREATER THAN 100.5 F  *CHILLS WITH OR WITHOUT FEVER  NAUSEA AND VOMITING THAT IS NOT CONTROLLED WITH YOUR NAUSEA MEDICATION  *UNUSUAL SHORTNESS OF BREATH  *UNUSUAL BRUISING OR BLEEDING  TENDERNESS IN MOUTH AND THROAT WITH OR WITHOUT PRESENCE OF ULCERS  *URINARY PROBLEMS  *BOWEL PROBLEMS  UNUSUAL RASH Items with * indicate a potential emergency and should be followed up as soon as possible.  Feel free to call the clinic you have any questions or concerns. The clinic phone number is [phone].    

## 2014-05-30 NOTE — Progress Notes (Signed)
Per Jenny Reichmann, RN in research- ok to treat with ANC 1.3- no modifications needed.

## 2014-05-31 ENCOUNTER — Encounter: Payer: Self-pay | Admitting: *Deleted

## 2014-05-31 NOTE — Progress Notes (Signed)
05/30/2014 Patient in to clinic today for Day 8 labs and injection. No treatment modification indicated per protocol section 5.4 for grade 1 ANC. Patient will collect 24-hour urine for confirmation of response, and is expected to return her sample on Thursday 7/2. Sign for injection given to Mariana Kaufman RN. [name] BSN, RN, [place] 05/31/2014 8:42 AM

## 2014-06-06 ENCOUNTER — Other Ambulatory Visit: Payer: Self-pay | Admitting: *Deleted

## 2014-06-06 LAB — UPEP/TP, 24-HR URINE
Collection Interval: 24 hours
Total Protein, Urine/Day: 84 mg/d (ref 50–100)
Total Protein, Urine: 4 mg/dL
Total Volume, Urine: 2100 mL

## 2014-06-06 LAB — UIFE/LIGHT CHAINS/TP QN, 24-HR UR
Albumin, U: DETECTED
Alpha 1, Urine: DETECTED — AB
Alpha 2, Urine: DETECTED — AB
Beta, Urine: DETECTED — AB
Free Kappa Lt Chains,Ur: 2.67 mg/dL — ABNORMAL HIGH (ref 0.14–2.42)
Free Kappa/Lambda Ratio: 9.89 ratio (ref 2.04–10.37)
Free Lambda Excretion/Day: 5.67 mg/d
Free Lambda Lt Chains,Ur: 0.27 mg/dL (ref 0.02–0.67)
Free Lt Chn Excr Rate: 56.07 mg/d
Gamma Globulin, Urine: DETECTED — AB
Time: 24 hours
Total Protein, Urine-Ur/day: 74 mg/d (ref 10–140)
Total Protein, Urine: 3.5 mg/dL
Volume, Urine: 2100 mL

## 2014-06-06 MED ORDER — LENALIDOMIDE 15 MG PO CAPS: 15.0000 mg | ORAL_CAPSULE | Freq: Every day | ORAL | Status: DC

## 2014-06-06 NOTE — Telephone Encounter (Signed)
Refill request for Revlimid 15 mg to MD desk for review.

## 2014-06-08 ENCOUNTER — Encounter: Payer: Self-pay | Admitting: *Deleted

## 2014-06-12 ENCOUNTER — Telehealth: Payer: Self-pay | Admitting: *Deleted

## 2014-06-12 NOTE — Telephone Encounter (Signed)
Patient called and canceled her appts for tomorrow. Patient will call back to reschedule.  JMW

## 2014-06-12 NOTE — Telephone Encounter (Signed)
Pt states has not heard from [facility] about her Revlimid and she is due to re start it tomorrow.  Called [facility] and s/w Jasmine.  We faxed Rx to them on 06/06/14.  Delana Meyer says it is still being processed and she will mark it STAT.   Informed pt to wait to hear from [facility], they do have Rx and are working on it.  She verbalized understanding.

## 2014-06-13 ENCOUNTER — Other Ambulatory Visit: Payer: 59

## 2014-06-13 ENCOUNTER — Encounter: Payer: Self-pay | Admitting: *Deleted

## 2014-06-13 ENCOUNTER — Ambulatory Visit: Payer: 59 | Admitting: Hematology and Oncology

## 2014-06-13 ENCOUNTER — Ambulatory Visit: Payer: 59

## 2014-06-13 ENCOUNTER — Encounter: Payer: Self-pay | Admitting: Hematology and Oncology

## 2014-06-13 NOTE — Progress Notes (Signed)
06/13/2014 Received telephone call from patient on 06/12/2014 indicating that she had not yet received her Revlimid refill. Call transferred to [facility] voice mail, Dr. Calton Dach nurse for follow-up. Received second phone call from patient in the afternoon on 06/12/2014, stating that she has learned of a problem with her insurance coverage during transition from short-term disability to long-term disability. Patient states that because she does not have insurance coverage at present, she cannot proceed with appointments and treatment visit tomorrow. Patient states that she believes insurance coverage will be reinstated, at her cost, by next week. Will proceed with rescheduling treatment and appointments to allow for Cycle 10 to start in one week, on 06/20/2014. Message routed to Dr. Alvy Bimler and Cameo regarding patient's appointment cancellations by the scheduler. [name] BSN, RN, CCRP 06/13/2014 8:53 AM

## 2014-06-13 NOTE — Telephone Encounter (Signed)
Forwarded to Care management, Ebony for future reference.

## 2014-06-14 ENCOUNTER — Telehealth: Payer: Self-pay | Admitting: Hematology and Oncology

## 2014-06-14 NOTE — Telephone Encounter (Signed)
S.W. PT AND ADVISED ON ALL APPTS....PT OK ADN AWARE

## 2014-06-16 ENCOUNTER — Telehealth: Payer: Self-pay | Admitting: *Deleted

## 2014-06-16 NOTE — Telephone Encounter (Signed)
Pt left a message that she needs to put all treatments/appts on hold for 2-3 weeks due to insurance. Next appointment is 06/20/14

## 2014-06-19 ENCOUNTER — Other Ambulatory Visit: Payer: Self-pay | Admitting: *Deleted

## 2014-06-19 ENCOUNTER — Telehealth: Payer: Self-pay | Admitting: Hematology and Oncology

## 2014-06-19 ENCOUNTER — Telehealth: Payer: Self-pay | Admitting: *Deleted

## 2014-06-19 ENCOUNTER — Encounter: Payer: Self-pay | Admitting: Hematology and Oncology

## 2014-06-19 NOTE — Telephone Encounter (Signed)
s.w. pt and advised on July appt....pt ok and aware °

## 2014-06-19 NOTE — Progress Notes (Signed)
Called and spoke with Apolonio Schneiders at [facility] and insurance termed 05/23/14. She is no longer covered.

## 2014-06-19 NOTE — Telephone Encounter (Signed)
DO i need to see her next week?

## 2014-06-19 NOTE — Telephone Encounter (Signed)
06/19/2014 Patient called to say that insurance premium has been paid by her, and she expects to be able to resume treatment next Tuesday 7/28. This is within the allowed 14-day window per protocol. Patient is also contacting [facility] to ensure that she will receive her Revlimid by the end of the week. Scheduling request sent for C10D1 on 7/28 and C10D8 on 8/4. [name] BSN, RN, [place] 06/19/2014 11:54 AM

## 2014-06-20 ENCOUNTER — Other Ambulatory Visit: Payer: 59

## 2014-06-20 ENCOUNTER — Ambulatory Visit: Payer: 59

## 2014-06-20 ENCOUNTER — Ambulatory Visit: Payer: 59 | Admitting: Hematology and Oncology

## 2014-06-26 ENCOUNTER — Telehealth: Payer: Self-pay | Admitting: Hematology and Oncology

## 2014-06-26 ENCOUNTER — Other Ambulatory Visit: Payer: Self-pay | Admitting: *Deleted

## 2014-06-26 NOTE — Telephone Encounter (Signed)
ADDED FLUSH TO LAB APPT TOMORROW. PER 7/27 P[facility] LAB FROM [facility]. NO OTHER ORDERS.

## 2014-06-26 NOTE — Telephone Encounter (Signed)
RECEIVED A FAX FROM [facility] CONCERNING A CONFIRMATION OF PRESCRIPTION SHIPMENT FOR REVLIMID ON 06/23/14.

## 2014-06-27 ENCOUNTER — Encounter: Payer: Self-pay | Admitting: *Deleted

## 2014-06-27 ENCOUNTER — Encounter: Payer: Self-pay | Admitting: Hematology and Oncology

## 2014-06-27 ENCOUNTER — Other Ambulatory Visit: Payer: 59

## 2014-06-27 ENCOUNTER — Ambulatory Visit (HOSPITAL_BASED_OUTPATIENT_CLINIC_OR_DEPARTMENT_OTHER): Payer: 59

## 2014-06-27 ENCOUNTER — Ambulatory Visit: Payer: 59

## 2014-06-27 ENCOUNTER — Telehealth: Payer: Self-pay | Admitting: Hematology and Oncology

## 2014-06-27 ENCOUNTER — Telehealth: Payer: Self-pay | Admitting: *Deleted

## 2014-06-27 ENCOUNTER — Ambulatory Visit (HOSPITAL_BASED_OUTPATIENT_CLINIC_OR_DEPARTMENT_OTHER): Payer: 59 | Admitting: Hematology and Oncology

## 2014-06-27 ENCOUNTER — Ambulatory Visit: Payer: 59 | Admitting: Hematology and Oncology

## 2014-06-27 ENCOUNTER — Other Ambulatory Visit (HOSPITAL_BASED_OUTPATIENT_CLINIC_OR_DEPARTMENT_OTHER): Payer: 59

## 2014-06-27 VITALS — BP 139/80 | HR 99 | Temp 98.5°F | Resp 20 | Ht 67.0 in | Wt 192.1 lb

## 2014-06-27 DIAGNOSIS — D701 Agranulocytosis secondary to cancer chemotherapy: Secondary | ICD-10-CM

## 2014-06-27 DIAGNOSIS — I82409 Acute embolism and thrombosis of unspecified deep veins of unspecified lower extremity: Secondary | ICD-10-CM

## 2014-06-27 DIAGNOSIS — C9000 Multiple myeloma not having achieved remission: Secondary | ICD-10-CM

## 2014-06-27 DIAGNOSIS — Z95828 Presence of other vascular implants and grafts: Secondary | ICD-10-CM

## 2014-06-27 DIAGNOSIS — M8708 Idiopathic aseptic necrosis of bone, other site: Secondary | ICD-10-CM

## 2014-06-27 DIAGNOSIS — Z452 Encounter for adjustment and management of vascular access device: Secondary | ICD-10-CM

## 2014-06-27 DIAGNOSIS — D630 Anemia in neoplastic disease: Secondary | ICD-10-CM

## 2014-06-27 DIAGNOSIS — T451X5A Adverse effect of antineoplastic and immunosuppressive drugs, initial encounter: Secondary | ICD-10-CM

## 2014-06-27 DIAGNOSIS — Z5112 Encounter for antineoplastic immunotherapy: Secondary | ICD-10-CM

## 2014-06-27 DIAGNOSIS — D72819 Decreased white blood cell count, unspecified: Secondary | ICD-10-CM

## 2014-06-27 DIAGNOSIS — C8299 Follicular lymphoma, unspecified, extranodal and solid organ sites: Secondary | ICD-10-CM

## 2014-06-27 LAB — COMPREHENSIVE METABOLIC PANEL (CC13)
ALT: 36 U/L (ref 0–55)
AST: 25 U/L (ref 5–34)
Albumin: 3.8 g/dL (ref 3.5–5.0)
Alkaline Phosphatase: 58 U/L (ref 40–150)
Anion Gap: 8 mEq/L (ref 3–11)
BUN: 10.3 mg/dL (ref 7.0–26.0)
CO2: 26 mEq/L (ref 22–29)
Calcium: 9.7 mg/dL (ref 8.4–10.4)
Chloride: 103 mEq/L (ref 98–109)
Creatinine: 0.9 mg/dL (ref 0.6–1.1)
Glucose: 199 mg/dl — ABNORMAL HIGH (ref 70–140)
Potassium: 3.9 mEq/L (ref 3.5–5.1)
Sodium: 137 mEq/L (ref 136–145)
Total Bilirubin: 0.31 mg/dL (ref 0.20–1.20)
Total Protein: 7 g/dL (ref 6.4–8.3)

## 2014-06-27 LAB — CBC WITH DIFFERENTIAL/PLATELET
BASO%: 0.3 % (ref 0.0–2.0)
Basophils Absolute: 0 10*3/uL (ref 0.0–0.1)
EOS%: 0 % (ref 0.0–7.0)
Eosinophils Absolute: 0 10*3/uL (ref 0.0–0.5)
HCT: 35.6 % (ref 34.8–46.6)
HGB: 11.2 g/dL — ABNORMAL LOW (ref 11.6–15.9)
LYMPH%: 13.9 % — ABNORMAL LOW (ref 14.0–49.7)
MCH: 24.2 pg — ABNORMAL LOW (ref 25.1–34.0)
MCHC: 31.5 g/dL (ref 31.5–36.0)
MCV: 76.9 fL — ABNORMAL LOW (ref 79.5–101.0)
MONO#: 0.1 10*3/uL (ref 0.1–0.9)
MONO%: 2.7 % (ref 0.0–14.0)
NEUT#: 3.1 10*3/uL (ref 1.5–6.5)
NEUT%: 83.1 % — ABNORMAL HIGH (ref 38.4–76.8)
Platelets: 287 10*3/uL (ref 145–400)
RBC: 4.63 10*6/uL (ref 3.70–5.45)
RDW: 15 % — ABNORMAL HIGH (ref 11.2–14.5)
WBC: 3.7 10*3/uL — ABNORMAL LOW (ref 3.9–10.3)
lymph#: 0.5 10*3/uL — ABNORMAL LOW (ref 0.9–3.3)

## 2014-06-27 LAB — LACTATE DEHYDROGENASE (CC13): LDH: 261 U/L — ABNORMAL HIGH (ref 125–245)

## 2014-06-27 MED ORDER — INV-BORTEZOMIB CHEMO SQ INJECTION 3.5 MG CTSU E1A11
1.0000 mg/m2 | Freq: Once | INTRAVENOUS | Status: AC
  Administered 2014-06-27: 2 mg via SUBCUTANEOUS
  Filled 2014-06-27: qty 2

## 2014-06-27 MED ORDER — ONDANSETRON HCL 8 MG PO TABS
ORAL_TABLET | ORAL | Status: AC
  Filled 2014-06-27: qty 1

## 2014-06-27 MED ORDER — ONDANSETRON HCL 8 MG PO TABS
8.0000 mg | ORAL_TABLET | Freq: Once | ORAL | Status: AC
  Administered 2014-06-27: 8 mg via ORAL

## 2014-06-27 MED ORDER — SODIUM CHLORIDE 0.9 % IJ SOLN
10.0000 mL | INTRAMUSCULAR | Status: DC | PRN
  Administered 2014-06-27: 10 mL via INTRAVENOUS
  Filled 2014-06-27: qty 10

## 2014-06-27 NOTE — Assessment & Plan Note (Signed)
This is likely due to recent treatment. The patient denies recent history of fevers, cough, chills, diarrhea or dysuria. She is asymptomatic from the leukopenia. I will observe for now.  I will continue the chemotherapy at current dose without dosage adjustment.  If the leukopenia gets progressive worse in the future, I might have to delay her treatment or adjust the chemotherapy dose.   

## 2014-06-27 NOTE — Assessment & Plan Note (Signed)
The patient continued to do well on her chemotherapy. Per research trial, we'll continue treatment without dosage adjustment. Her dexamethasone is currently reduced to 10 mg per protocol. She will continue on acyclovir prophylaxis and has resumed Bactrim. Bone marrow biopsy would be due in 2 months. The patient has achieved very good partial response.

## 2014-06-27 NOTE — Telephone Encounter (Signed)
Per POF staff phone call scheduled appts. Advised schedulers 

## 2014-06-27 NOTE — Patient Instructions (Signed)
[facility] Discharge Instructions for Patients Receiving Chemotherapy  Today you received the following chemotherapy agents velcade.  To help prevent nausea and vomiting after your treatment, we encourage you to take your nausea medication.   If you develop nausea and vomiting that is not controlled by your nausea medication, call the clinic.   BELOW ARE SYMPTOMS THAT SHOULD BE REPORTED IMMEDIATELY:  *FEVER GREATER THAN 100.5 F  *CHILLS WITH OR WITHOUT FEVER  NAUSEA AND VOMITING THAT IS NOT CONTROLLED WITH YOUR NAUSEA MEDICATION  *UNUSUAL SHORTNESS OF BREATH  *UNUSUAL BRUISING OR BLEEDING  TENDERNESS IN MOUTH AND THROAT WITH OR WITHOUT PRESENCE OF ULCERS  *URINARY PROBLEMS  *BOWEL PROBLEMS  UNUSUAL RASH Items with * indicate a potential emergency and should be followed up as soon as possible.  Feel free to call the clinic you have any questions or concerns. The clinic phone number is [phone].    

## 2014-06-27 NOTE — Assessment & Plan Note (Signed)

## 2014-06-27 NOTE — Assessment & Plan Note (Signed)
She is doing well on anticoagulation therapy. She has no bleeding complications. Continue the same.

## 2014-06-27 NOTE — Progress Notes (Signed)
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   05/05/2014 Imaging Imaging study of the neck showed no explanation that could cause right neck pain. She is noted to have incidental left upper lung nodule. Plan to repeat imaging study in 3 months.    INTERVAL HISTORY: Please see below for problem oriented charting. She is seen prior to cycle 10 of treatment. She seemed prior denies recent bone pain. 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 She denies recent infection The chemotherapy was slightly delayed due to insurance issue. She denies recent bleeding.  REVIEW OF SYSTEMS:   Constitutional: Denies fevers, chills or abnormal weight loss Eyes: Denies blurriness of vision Ears, nose, mouth, throat, and face: Denies mucositis or sore throat Respiratory: Denies cough, dyspnea or wheezes Cardiovascular: Denies palpitation, chest discomfort or lower extremity swelling Gastrointestinal:  Denies nausea, heartburn or change in bowel habits Skin: Denies abnormal skin rashes Lymphatics: Denies new lymphadenopathy or easy bruising Neurological:Denies numbness, tingling or new  weaknesses Behavioral/Psych: Mood is stable, no new changes  All other systems were reviewed with the patient and are negative.  I have reviewed the past medical history, past surgical history, social history and family history with the patient and they are unchanged from previous note.  ALLERGIES:  has No Known Allergies.  MEDICATIONS:  Current Outpatient Prescriptions  Medication Sig Dispense Refill  . acyclovir (ZOVIRAX) 400 MG tablet Take 1 tablet (400 mg total) by mouth 2 (two) times daily.  60 tablet  6  . calcium carbonate (TUMS - DOSED IN MG ELEMENTAL CALCIUM) 500 MG chewable tablet Chew 1 tablet by mouth as needed for indigestion or heartburn.      . carboxymethylcellulose (REFRESH PLUS) 0.5 % SOLN 1 drop daily as needed.      . Cholecalciferol (VITAMIN D3) 2000 UNITS TABS Take 2,000 Units by mouth daily.      Marland Kitchen dexamethasone (DECADRON) 4 MG tablet Take 2 & 1/2 tab (10 mg) on days 1,2,8,9 every 21 days for cycles 9-12.  40 tablet  0  . hydrochlorothiazide (HYDRODIURIL) 25 MG tablet Take 25 mg by mouth daily with breakfast.       . lenalidomide (REVLIMID) 15 MG capsule Take 1 capsule (15 mg total) by mouth daily. X 14 days on and 7 days off.  14 capsule  0  . levothyroxine (SYNTHROID, LEVOTHROID) 75 MCG tablet Take 75 mcg by mouth daily before breakfast.      . lidocaine-prilocaine (EMLA) cream Apply 1 application topically as needed (port).      . Multiple Vitamins-Minerals (CENTRUM SILVER PO) Take 1 tablet by mouth daily.       Marland Kitchen oxyCODONE 10 MG TABS Take 1 tablet (10 mg total) by mouth  every 4 (four) hours as needed for severe pain.  60 tablet  0  . polyethylene glycol (MIRALAX / GLYCOLAX) packet Take 17 g by mouth daily.      . prochlorperazine (COMPAZINE) 10 MG tablet Take 1 tablet (10 mg total) by mouth every 6 (six) hours as needed (Nausea or vomiting).  30 tablet  1  . rivaroxaban (XARELTO) 20 MG TABS tablet Take 1 tablet (20 mg total) by mouth daily with supper.  90 tablet  3   . sulfamethoxazole-trimethoprim (BACTRIM DS,SEPTRA DS) 800-160 MG per tablet Take 1 tablet by mouth as directed. 1 tab twice a day on Mondays, Wednesday and Friday  60 tablet  1  . traMADol (ULTRAM) 50 MG tablet Take 1 tablet (50 mg total) by mouth every 6 (six) hours as needed for moderate pain or severe pain.  60 tablet  0  . venlafaxine XR (EFFEXOR-XR) 75 MG 24 hr capsule Take 1 capsule (75 mg total) by mouth 2 (two) times daily.  10 capsule  1  . venlafaxine XR (EFFEXOR-XR) 75 MG 24 hr capsule Take 1 capsule (75 mg  total) by mouth 2 (two)  times daily.  180 capsule  0  . verapamil (VERELAN PM) 120 MG 24 hr capsule Take 120 mg by mouth at bedtime.       No current facility-administered medications for this visit.    PHYSICAL EXAMINATION: ECOG PERFORMANCE STATUS: 0 - Asymptomatic  Filed Vitals:   06/27/14 1413  BP: 139/80  Pulse: 99  Temp: 98.5 F (36.9 C)  Resp: 20   Filed Weights   06/27/14 1413  Weight: 192 lb 1.6 oz (87.136 kg)    GENERAL:alert, no distress and comfortable. She looked mildly cushingoid  SKIN: skin color, texture, turgor are normal, no rashes or significant lesions EYES: normal, Conjunctiva are pink and non-injected, sclera clear OROPHARYNX:no exudate, no erythema and lips, buccal mucosa, and tongue normal . Osteonecrosis of the jaw is stable.  NECK: supple, thyroid normal size, non-tender, without nodularity LYMPH:  no palpable lymphadenopathy in the cervical, axillary or inguinal LUNGS: clear to auscultation and percussion with normal breathing effort HEART: regular rate & rhythm and no murmurs and no lower extremity edema ABDOMEN:abdomen soft, non-tender and normal bowel sounds Musculoskeletal:no cyanosis of digits and no clubbing  NEURO: alert & oriented x 3 with fluent speech, no focal motor/sensory deficits  LABORATORY DATA:  I have reviewed the data as listed    Component Value Date/Time   NA 137 06/27/2014 1347   K 3.9 06/27/2014 1347   CO2 26  06/27/2014 1347   GLUCOSE 199* 06/27/2014 1347   BUN 10.3 06/27/2014 1347   CREATININE 0.9 06/27/2014 1347   CALCIUM 9.7 06/27/2014 1347   PROT 7.0 06/27/2014 1347   ALBUMIN 3.8 06/27/2014 1347   AST 25 06/27/2014 1347   ALT 36 06/27/2014 1347   ALKPHOS 58 06/27/2014 1347   BILITOT 0.31 06/27/2014 1347    No results found for this basename: SPEP, UPEP,  kappa and lambda light chains    Lab Results  Component Value Date   WBC 3.7* 06/27/2014   NEUTROABS 3.1 06/27/2014   HGB 11.2* 06/27/2014   HCT 35.6 06/27/2014   MCV 76.9* 06/27/2014   PLT 287 06/27/2014      Chemistry      Component Value Date/Time   NA 137 06/27/2014 1347   K 3.9 06/27/2014 1347   CO2 26 06/27/2014 1347   BUN 10.3 06/27/2014 1347  CREATININE 0.9 06/27/2014 1347      Component Value Date/Time   CALCIUM 9.7 06/27/2014 1347   ALKPHOS 58 06/27/2014 1347   AST 25 06/27/2014 1347   ALT 36 06/27/2014 1347   BILITOT 0.31 06/27/2014 1347      ASSESSMENT & PLAN:  Multiple myeloma, without mention of having achieved remission The patient continued to do well on her chemotherapy. Per research trial, we'll continue treatment without dosage adjustment. Her dexamethasone is currently reduced to 10 mg per protocol. She will continue on acyclovir prophylaxis and has resumed Bactrim. Bone marrow biopsy would be due in 2 months. The patient has achieved very good partial response.   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 Continue supportive care and continue to hold Zometa.    DVT (deep venous thrombosis) She is doing well on anticoagulation therapy. She has no bleeding complications. Continue the same.    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 The patient denies recent history of bleeding such as epistaxis, hematuria or hematochezia. She is asymptomatic from the anemia. I will observe for now.  She does not require transfusion now. I will continue the chemotherapy at current dose without  dosage adjustment.  If the anemia gets progressive worse in the future, I might have to delay her treatment or adjust the chemotherapy dose.       Leukopenia due to antineoplastic chemotherapy This is likely due to recent treatment. The patient denies recent history of fevers, cough, chills, diarrhea or dysuria. She is asymptomatic from the leukopenia. I will observe for now.  I will continue the chemotherapy at current dose without dosage adjustment.  If the leukopenia gets progressive worse in the future, I might have to delay her treatment or adjust the chemotherapy dose.          All questions were answered. The patient knows to call the clinic with any problems, questions or concerns. No barriers to learning was detected. I spent 25 minutes counseling the patient face to face. The total time spent in the appointment was 30 minutes and more than 50% was on counseling and review of test results     [facility], [place], MD 06/27/2014 2:51 PM

## 2014-06-27 NOTE — Patient Instructions (Signed)

## 2014-06-27 NOTE — Telephone Encounter (Signed)
, °

## 2014-06-27 NOTE — Progress Notes (Signed)
Called patient about velcade patient assistance; she states she is getting her [facility] insurance reinstated through [employer].

## 2014-06-27 NOTE — Progress Notes (Signed)
06/27/2014 Patient in to clinic today for evaluation prior to initiating treatment Cycle 10. Of note, patient's treatment has been delayed for two weeks due to insurance issues. Treatment plans resumed one week ago when patient received confirmation of benefits, in time for Revlimid to be shipped to her home yesterday, for beginning treatment today. Patient reports that her neuropathy symptoms have been unchanged during this cycle (grade 1), with no impact on her ADLs. She had recurrent mild symptoms of eye pain, fatigue and nausea. She experienced mild back and spine pain mid-cycle. Patient returned completed cycle 9 Patient Medication Calendar, confirming correct dosing as prescribed. Blank cycle 10 Patient Medication Calendar was given to patient today for completion. Based on lab results review and history and physical by Dr. Alvy Bimler, patient condition is acceptable for continued treatment. Sign for injection given to Thane Edu RN. [name] BSN, RN, [facility] 06/27/2014 4:52 PM

## 2014-06-27 NOTE — Progress Notes (Signed)
Patient said she should be getting a new card. She said the insurance is the same and she has paid thru [employer]. I advised her to make sure to bring in the card when she gets and that they will retro back. She will bring in proof of income for her and hubby for [employer]. I advised her what is covered with the grant.

## 2014-06-27 NOTE — Assessment & Plan Note (Signed)
This is improving. Continue supportive care and continue to hold Zometa.

## 2014-06-29 ENCOUNTER — Telehealth: Payer: Self-pay | Admitting: *Deleted

## 2014-06-29 LAB — SPEP & IFE WITH QIG
Albumin ELP: 60.4 % (ref 55.8–66.1)
Alpha-1-Globulin: 4.2 % (ref 2.9–4.9)
Alpha-2-Globulin: 10.4 % (ref 7.1–11.8)
Beta 2: 5.3 % (ref 3.2–6.5)
Beta Globulin: 6.8 % (ref 4.7–7.2)
Gamma Globulin: 12.9 % (ref 11.1–18.8)
IgA: 52 mg/dL — ABNORMAL LOW (ref 69–380)
IgG (Immunoglobin G), Serum: 883 mg/dL (ref 690–1700)
IgM, Serum: 38 mg/dL — ABNORMAL LOW (ref 52–322)
M-Spike, %: 0.31 g/dL
Total Protein, Serum Electrophoresis: 6.7 g/dL (ref 6.0–8.3)

## 2014-06-29 LAB — KAPPA/LAMBDA LIGHT CHAINS
Kappa free light chain: 0.66 mg/dL (ref 0.33–1.94)
Kappa:Lambda Ratio: 0.66 (ref 0.26–1.65)
Lambda Free Lght Chn: 1 mg/dL (ref 0.57–2.63)

## 2014-06-29 NOTE — Telephone Encounter (Signed)
Per [facility] staff message scheduled appts. Advised scheduler 

## 2014-06-30 ENCOUNTER — Encounter: Payer: Self-pay | Admitting: Hematology and Oncology

## 2014-06-30 NOTE — Progress Notes (Signed)
Faxed clinical information to [facility] @ [phone]

## 2014-07-04 ENCOUNTER — Encounter: Payer: Self-pay | Admitting: *Deleted

## 2014-07-04 ENCOUNTER — Other Ambulatory Visit (HOSPITAL_BASED_OUTPATIENT_CLINIC_OR_DEPARTMENT_OTHER): Payer: 59

## 2014-07-04 ENCOUNTER — Ambulatory Visit (HOSPITAL_BASED_OUTPATIENT_CLINIC_OR_DEPARTMENT_OTHER): Payer: 59

## 2014-07-04 VITALS — BP 128/72 | HR 84 | Temp 98.2°F | Resp 16

## 2014-07-04 DIAGNOSIS — C9000 Multiple myeloma not having achieved remission: Secondary | ICD-10-CM

## 2014-07-04 DIAGNOSIS — Z5112 Encounter for antineoplastic immunotherapy: Secondary | ICD-10-CM

## 2014-07-04 LAB — CBC WITH DIFFERENTIAL/PLATELET
BASO%: 0.2 % (ref 0.0–2.0)
Basophils Absolute: 0 10*3/uL (ref 0.0–0.1)
EOS%: 0.1 % (ref 0.0–7.0)
Eosinophils Absolute: 0 10*3/uL (ref 0.0–0.5)
HCT: 36.9 % (ref 34.8–46.6)
HGB: 11.4 g/dL — ABNORMAL LOW (ref 11.6–15.9)
LYMPH%: 7.3 % — ABNORMAL LOW (ref 14.0–49.7)
MCH: 24 pg — ABNORMAL LOW (ref 25.1–34.0)
MCHC: 30.8 g/dL — ABNORMAL LOW (ref 31.5–36.0)
MCV: 77.8 fL — ABNORMAL LOW (ref 79.5–101.0)
MONO#: 0.2 10*3/uL (ref 0.1–0.9)
MONO%: 4.1 % (ref 0.0–14.0)
NEUT#: 5.3 10*3/uL (ref 1.5–6.5)
NEUT%: 88.3 % — ABNORMAL HIGH (ref 38.4–76.8)
Platelets: 280 10*3/uL (ref 145–400)
RBC: 4.75 10*6/uL (ref 3.70–5.45)
RDW: 15.2 % — ABNORMAL HIGH (ref 11.2–14.5)
WBC: 6 10*3/uL (ref 3.9–10.3)
lymph#: 0.4 10*3/uL — ABNORMAL LOW (ref 0.9–3.3)

## 2014-07-04 MED ORDER — ONDANSETRON HCL 8 MG PO TABS
ORAL_TABLET | ORAL | Status: AC
  Filled 2014-07-04: qty 1

## 2014-07-04 MED ORDER — ONDANSETRON HCL 8 MG PO TABS
8.0000 mg | ORAL_TABLET | Freq: Once | ORAL | Status: AC
  Administered 2014-07-04: 8 mg via ORAL

## 2014-07-04 MED ORDER — INV-BORTEZOMIB CHEMO SQ INJECTION 3.5 MG CTSU E1A11
1.0000 mg/m2 | Freq: Once | INTRAVENOUS | Status: AC
  Administered 2014-07-04: 2 mg via SUBCUTANEOUS
  Filled 2014-07-04: qty 2

## 2014-07-04 NOTE — Progress Notes (Signed)
07/04/2014 Patient in to clinic today for day 8 treatment. CBC results within parameters for continued treatment. Patient has no specific new complaints today and generally feels well. She reports no change in her neuropathy, which does not affect her ADLs (grade 1).  Sign for injection given to Cira Rue RN. [name] BSN, RN, [facility] 07/04/2014 2:25 PM

## 2014-07-04 NOTE — Patient Instructions (Signed)
[facility] Discharge Instructions for Patients Receiving Chemotherapy  Today you received the following chemotherapy agents Vidaza  To help prevent nausea and vomiting after your treatment, we encourage you to take your nausea medication as prescribed.   If you develop nausea and vomiting that is not controlled by your nausea medication, call the clinic.   BELOW ARE SYMPTOMS THAT SHOULD BE REPORTED IMMEDIATELY:  *FEVER GREATER THAN 100.5 F  *CHILLS WITH OR WITHOUT FEVER  NAUSEA AND VOMITING THAT IS NOT CONTROLLED WITH YOUR NAUSEA MEDICATION  *UNUSUAL SHORTNESS OF BREATH  *UNUSUAL BRUISING OR BLEEDING  TENDERNESS IN MOUTH AND THROAT WITH OR WITHOUT PRESENCE OF ULCERS  *URINARY PROBLEMS  *BOWEL PROBLEMS  UNUSUAL RASH Items with * indicate a potential emergency and should be followed up as soon as possible.  Feel free to call the clinic you have any questions or concerns. The clinic phone number is [phone].   Azacitidine suspension for injection (subcutaneous use) What is this medicine? AZACITIDINE (ay [place]) is a chemotherapy drug. This medicine reduces the growth of cancer cells and can suppress the immune system. It is used for treating myelodysplastic syndrome or some types of leukemia. This medicine may be used for other purposes; ask your health care provider or pharmacist if you have questions. COMMON BRAND NAME(S): Vidaza What should I tell my health care provider before I take this medicine? They need to know if you have any of these conditions: -infection (especially a virus infection such as chickenpox, cold sores, or herpes) -kidney disease -liver disease -liver tumors -an unusual or allergic reaction to azacitidine, mannitol, other medicines, foods, dyes, or preservatives -pregnant or trying to get pregnant -breast-feeding How should I use this medicine? This medicine is for injection under the skin. It is administered in a  hospital or clinic by a specially trained health care professional. Talk to your pediatrician regarding the use of this medicine in children. While this drug may be prescribed for selected conditions, precautions do apply. Overdosage: If you think you have taken too much of this medicine contact a poison control center or emergency room at once. NOTE: This medicine is only for you. Do not share this medicine with others. What if I miss a dose? It is important not to miss your dose. Call your doctor or health care professional if you are unable to keep an appointment. What may interact with this medicine? -vaccines Talk to your doctor or health care professional before taking any of these medicines: -acetaminophen -aspirin -ibuprofen -ketoprofen -naproxen This list may not describe all possible interactions. Give your health care provider a list of all the medicines, herbs, non-prescription drugs, or dietary supplements you use. Also tell them if you smoke, drink alcohol, or use illegal drugs. Some items may interact with your medicine. What should I watch for while using this medicine? Visit your doctor for checks on your progress. This drug may make you feel generally unwell. This is not uncommon, as chemotherapy can affect healthy cells as well as cancer cells. Report any side effects. Continue your course of treatment even though you feel ill unless your doctor tells you to stop. In some cases, you may be given additional medicines to help with side effects. Follow all directions for their use. Call your doctor or health care professional for advice if you get a fever, chills or sore throat, or other symptoms of a cold or flu. Do not treat yourself. This drug decreases your body's  ability to fight infections. Try to avoid being around people who are sick. This medicine may increase your risk to bruise or bleed. Call your doctor or health care professional if you notice any unusual bleeding. Be  careful brushing and flossing your teeth or using a toothpick because you may get an infection or bleed more easily. If you have any dental work done, tell your dentist you are receiving this medicine. Avoid taking products that contain aspirin, acetaminophen, ibuprofen, naproxen, or ketoprofen unless instructed by your doctor. These medicines may hide a fever. Do not have any vaccinations without your doctor's approval and avoid anyone who has recently had oral polio vaccine. Do not become pregnant while taking this medicine. Women should inform their doctor if they wish to become pregnant or think they might be pregnant. There is a potential for serious side effects to an unborn child. Talk to your health care professional or pharmacist for more information. Do not breast-feed an infant while taking this medicine. If you are a man, you should not father a child while receiving treatment. What side effects may I notice from receiving this medicine? Side effects that you should report to your doctor or health care professional as soon as possible: -allergic reactions like skin rash, itching or hives, swelling of the face, lips, or tongue -low blood counts - this medicine may decrease the number of white blood cells, red blood cells and platelets. You may be at increased risk for infections and bleeding. -signs of infection - fever or chills, cough, sore throat, pain or difficulty passing urine -signs of decreased platelets or bleeding - bruising, pinpoint red spots on the skin, black, tarry stools, blood in the urine -signs of decreased red blood cells - unusually weak or tired, fainting spells, lightheadedness -reactions at the injection site including redness, pain, itching, or bruising -breathing problems -changes in vision -fever -mouth sores -stomach pain -vomiting Side effects that usually do not require medical attention (report to your doctor or health care professional if they continue or  are bothersome): -constipation -diarrhea -loss of appetite -nausea -pain or redness at the injection site -weak or tired This list may not describe all possible side effects. Call your doctor for medical advice about side effects. You may report side effects to FDA at 1-800-FDA-1088. Where should I keep my medicine? This drug is given in a hospital or clinic and will not be stored at home. NOTE: This sheet is a summary. It may not cover all possible information. If you have questions about this medicine, talk to your doctor, pharmacist, or health care provider.  2015, Elsevier/Gold Standard. (2008-02-10 11:04:07)

## 2014-07-04 NOTE — Progress Notes (Signed)
Treatment parameters verified with Jenny Reichmann, Electrical engineer; received sign for injection.

## 2014-07-05 ENCOUNTER — Encounter: Payer: Self-pay | Admitting: Hematology and Oncology

## 2014-07-05 NOTE — Progress Notes (Signed)
Refaxed clinical information to [facility] @ [phone]

## 2014-07-06 ENCOUNTER — Encounter: Payer: Self-pay | Admitting: *Deleted

## 2014-07-06 NOTE — Addendum Note (Signed)
Addended by: Benson Norway on: 07/06/2014 10:54 AM   Modules accepted: Orders

## 2014-07-10 ENCOUNTER — Other Ambulatory Visit: Payer: Self-pay | Admitting: *Deleted

## 2014-07-10 ENCOUNTER — Other Ambulatory Visit: Payer: Self-pay | Admitting: Neurology

## 2014-07-10 DIAGNOSIS — D630 Anemia in neoplastic disease: Secondary | ICD-10-CM

## 2014-07-10 NOTE — Telephone Encounter (Signed)
THIS REFILL REQUEST FOR REVLIMID WAS PLACED ON DR.GORSUCH'S DESK. 

## 2014-07-11 ENCOUNTER — Other Ambulatory Visit: Payer: Self-pay | Admitting: Neurology

## 2014-07-11 MED ORDER — LENALIDOMIDE 15 MG PO CAPS: 15.0000 mg | ORAL_CAPSULE | Freq: Every day | ORAL | Status: DC

## 2014-07-11 NOTE — Addendum Note (Signed)
Addended by: Wyonia Hough on: 07/11/2014 02:55 PM   Modules accepted: Orders

## 2014-07-17 NOTE — Telephone Encounter (Signed)
RECEIVED A FAX FROM [employer] CONCERNING A CONFIRMATION OF PRESCRIPTION SHIPMENT FOR REVLIMID ON 07/14/14.

## 2014-07-18 ENCOUNTER — Ambulatory Visit (HOSPITAL_BASED_OUTPATIENT_CLINIC_OR_DEPARTMENT_OTHER): Payer: 59 | Admitting: Hematology and Oncology

## 2014-07-18 ENCOUNTER — Ambulatory Visit: Payer: Self-pay

## 2014-07-18 ENCOUNTER — Other Ambulatory Visit (HOSPITAL_BASED_OUTPATIENT_CLINIC_OR_DEPARTMENT_OTHER): Payer: 59

## 2014-07-18 ENCOUNTER — Telehealth: Payer: Self-pay | Admitting: Hematology and Oncology

## 2014-07-18 ENCOUNTER — Encounter: Payer: Self-pay | Admitting: Hematology and Oncology

## 2014-07-18 ENCOUNTER — Encounter: Payer: 59 | Admitting: *Deleted

## 2014-07-18 VITALS — BP 133/70 | HR 80 | Temp 98.4°F | Resp 18 | Ht 67.0 in | Wt 194.8 lb

## 2014-07-18 DIAGNOSIS — T451X5A Adverse effect of antineoplastic and immunosuppressive drugs, initial encounter: Secondary | ICD-10-CM

## 2014-07-18 DIAGNOSIS — I82401 Acute embolism and thrombosis of unspecified deep veins of right lower extremity: Secondary | ICD-10-CM

## 2014-07-18 DIAGNOSIS — C9000 Multiple myeloma not having achieved remission: Secondary | ICD-10-CM

## 2014-07-18 DIAGNOSIS — R10814 Left lower quadrant abdominal tenderness: Secondary | ICD-10-CM

## 2014-07-18 DIAGNOSIS — R1032 Left lower quadrant pain: Secondary | ICD-10-CM

## 2014-07-18 DIAGNOSIS — M8710 Osteonecrosis due to drugs, unspecified bone: Secondary | ICD-10-CM

## 2014-07-18 DIAGNOSIS — K5792 Diverticulitis of intestine, part unspecified, without perforation or abscess without bleeding: Secondary | ICD-10-CM

## 2014-07-18 DIAGNOSIS — D630 Anemia in neoplastic disease: Secondary | ICD-10-CM

## 2014-07-18 DIAGNOSIS — D701 Agranulocytosis secondary to cancer chemotherapy: Secondary | ICD-10-CM

## 2014-07-18 DIAGNOSIS — K5732 Diverticulitis of large intestine without perforation or abscess without bleeding: Secondary | ICD-10-CM

## 2014-07-18 HISTORY — DX: Diverticulitis of intestine, part unspecified, without perforation or abscess without bleeding: K57.92

## 2014-07-18 LAB — CBC WITH DIFFERENTIAL/PLATELET
BASO%: 0.4 % (ref 0.0–2.0)
Basophils Absolute: 0 10*3/uL (ref 0.0–0.1)
EOS%: 2.6 % (ref 0.0–7.0)
Eosinophils Absolute: 0.1 10*3/uL (ref 0.0–0.5)
HCT: 37 % (ref 34.8–46.6)
HGB: 11.3 g/dL — ABNORMAL LOW (ref 11.6–15.9)
LYMPH%: 31.2 % (ref 14.0–49.7)
MCH: 23.8 pg — ABNORMAL LOW (ref 25.1–34.0)
MCHC: 30.5 g/dL — ABNORMAL LOW (ref 31.5–36.0)
MCV: 78 fL — ABNORMAL LOW (ref 79.5–101.0)
MONO#: 0.6 10*3/uL (ref 0.1–0.9)
MONO%: 24.2 % — ABNORMAL HIGH (ref 0.0–14.0)
NEUT#: 1.1 10*3/uL — ABNORMAL LOW (ref 1.5–6.5)
NEUT%: 41.6 % (ref 38.4–76.8)
Platelets: 193 10*3/uL (ref 145–400)
RBC: 4.74 10*6/uL (ref 3.70–5.45)
RDW: 15.1 % — ABNORMAL HIGH (ref 11.2–14.5)
WBC: 2.7 10*3/uL — ABNORMAL LOW (ref 3.9–10.3)
lymph#: 0.8 10*3/uL — ABNORMAL LOW (ref 0.9–3.3)

## 2014-07-18 LAB — COMPREHENSIVE METABOLIC PANEL (CC13)
ALT: 45 U/L (ref 0–55)
AST: 30 U/L (ref 5–34)
Albumin: 3.3 g/dL — ABNORMAL LOW (ref 3.5–5.0)
Alkaline Phosphatase: 47 U/L (ref 40–150)
Anion Gap: 8 mEq/L (ref 3–11)
BUN: 10.2 mg/dL (ref 7.0–26.0)
CO2: 28 mEq/L (ref 22–29)
Calcium: 9.3 mg/dL (ref 8.4–10.4)
Chloride: 106 mEq/L (ref 98–109)
Creatinine: 0.9 mg/dL (ref 0.6–1.1)
Glucose: 135 mg/dl (ref 70–140)
Potassium: 3.5 mEq/L (ref 3.5–5.1)
Sodium: 142 mEq/L (ref 136–145)
Total Bilirubin: 0.39 mg/dL (ref 0.20–1.20)
Total Protein: 6.5 g/dL (ref 6.4–8.3)

## 2014-07-18 LAB — LACTATE DEHYDROGENASE (CC13): LDH: 246 U/L — ABNORMAL HIGH (ref 125–245)

## 2014-07-18 MED ORDER — METRONIDAZOLE 500 MG PO TABS: 500.0000 mg | ORAL_TABLET | Freq: Three times a day (TID) | ORAL | Status: DC

## 2014-07-18 MED ORDER — CIPROFLOXACIN HCL 500 MG PO TABS: 500.0000 mg | ORAL_TABLET | Freq: Two times a day (BID) | ORAL | Status: DC

## 2014-07-18 NOTE — Assessment & Plan Note (Signed)
She has signs and symptoms to suggest diverticulitis. I recommend a trial of oral antibiotic therapy. If she is not better, I would recommend CT scan for further assessment. She agreed.

## 2014-07-18 NOTE — Assessment & Plan Note (Signed)
This is improving. Continue supportive care and continue to hold Zometa.

## 2014-07-18 NOTE — Progress Notes (Signed)
07/18/2014 Patient in to clinic today for evaluation at the end of Cycle 10 induction treatment. Patient reports onset of abdominal pain (LUQ) and occasional diarrhea since August 7th. Based on lab results review and history and physical by Dr. Alvy Bimler, patient condition was not felt to be acceptable for initiating treatment Cycle 11 today. Patient will begin oral antibiotic therapy with re-evaluation in one week to determine if protocol therapy may continue at that time. Patient reports that she has already taken today's dose of Revlimid and dexamethasone. Patient was instructed to discontinue this treatment until re-evaluated next week. Patient verbalized understanding. Patient returned completed Cycle 10 Patient Medication Calendar, confirming correct dosing as prescribed. Blank cycle 11 Patient Medication Calendar will be provided for patient if treatment is resumed next week. [name] BSN, RN, [place] 07/18/2014 2:07 PM

## 2014-07-18 NOTE — Assessment & Plan Note (Signed)
Due to recent symptoms suggestive of diverticulitis, I will hold her treatment for now and reassess next week.

## 2014-07-18 NOTE — Assessment & Plan Note (Addendum)
This is likely due to recent treatment. The patient denies recent history of bleeding such as epistaxis, hematuria or hematochezia. She is asymptomatic from the anemia. I will observe for now.   

## 2014-07-18 NOTE — Assessment & Plan Note (Signed)
She is doing well on anticoagulation therapy. She has no bleeding complications. Continue the same.

## 2014-07-18 NOTE — Progress Notes (Signed)
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   05/05/2014 Imaging Imaging study of the neck showed no explanation that could cause right neck pain. She is noted to have incidental left upper lung nodule. Plan to repeat imaging study in 3 months.    INTERVAL HISTORY: Please see below for problem oriented charting. She has acute flare of severe left lower quadrant abdominal pain from 07/07/2014 to 07/18/2014 with associated loose bowel movement. She denies any passage of blood or mucus. She have persistent tenderness with slight improvement. She denies nausea vomiting, loss of appetite. She also complained of mild pain on the right big toe. She denies recent trauma.  REVIEW OF SYSTEMS:   Constitutional: Denies fevers, chills or abnormal weight loss Eyes: Denies blurriness of vision Ears, nose, mouth, throat, and face: Denies mucositis or sore throat Respiratory: Denies cough, dyspnea or wheezes Cardiovascular: Denies palpitation, chest discomfort or lower extremity swelling Skin: Denies abnormal skin rashes Lymphatics: Denies new lymphadenopathy or easy bruising Neurological:Denies  numbness, tingling or new weaknesses Behavioral/Psych: Mood is stable, no new changes  All other systems were reviewed with the patient and are negative.  I have reviewed the past medical history, past surgical history, social history and family history with the patient and they are unchanged from previous note.  ALLERGIES:  has No Known Allergies.  MEDICATIONS:  Current Outpatient Prescriptions  Medication Sig Dispense Refill  . acyclovir (ZOVIRAX) 400 MG tablet Take 1 tablet (400 mg total) by mouth 2 (two) times daily.  60 tablet  6  . calcium carbonate (TUMS - DOSED IN MG ELEMENTAL CALCIUM) 500 MG chewable tablet Chew 1 tablet by mouth as needed for indigestion or heartburn.      . carboxymethylcellulose (REFRESH PLUS) 0.5 % SOLN 1 drop daily as needed.      . Cholecalciferol (VITAMIN D3) 2000 UNITS TABS Take 2,000 Units by mouth daily.      Marland Kitchen dexamethasone (DECADRON) 4 MG tablet Take 2 & 1/2 tab (10 mg) on days 1,2,8,9 every 21 days for cycles 9-12.  40 tablet  0  . hydrochlorothiazide (HYDRODIURIL) 25 MG tablet Take 25 mg by mouth daily with breakfast.       . lenalidomide (REVLIMID) 15 MG capsule Take 1 capsule (15 mg total) by mouth daily. X 14 days on and 7 days off.  14 capsule  0  . levothyroxine (SYNTHROID, LEVOTHROID) 75 MCG tablet Take 75 mcg by mouth daily before breakfast.      . lidocaine-prilocaine (EMLA) cream Apply 1 application topically as needed (port).      . Multiple Vitamins-Minerals (CENTRUM SILVER PO) Take 1 tablet by mouth daily.       Marland Kitchen oxyCODONE 10 MG TABS Take 1  tablet (10 mg total) by mouth every 4 (four) hours as needed for severe pain.  60 tablet  0  . polyethylene glycol (MIRALAX / GLYCOLAX) packet Take 17 g by mouth daily.      . prochlorperazine (COMPAZINE) 10 MG tablet Take 1 tablet (10 mg total) by mouth every 6 (six) hours as needed (Nausea or vomiting).  30 tablet  1  . rivaroxaban (XARELTO) 20 MG TABS tablet Take 1 tablet (20 mg total) by mouth daily  with supper.  90 tablet  3  . traMADol (ULTRAM) 50 MG tablet Take 1 tablet (50 mg total) by mouth every 6 (six) hours as needed for moderate pain or severe pain.  60 tablet  0  . venlafaxine XR (EFFEXOR-XR) 75 MG 24 hr capsule Take 1 capsule by mouth  twice daily  60 capsule  0  . verapamil (VERELAN PM) 120 MG 24 hr capsule Take 1 capsule (120 mg  total) by mouth daily.  90 capsule  0  . ciprofloxacin (CIPRO) 500 MG tablet Take 1 tablet (500 mg total) by mouth 2 (two) times daily.  14 tablet  0  . metroNIDAZOLE (FLAGYL) 500 MG tablet Take 1 tablet (500 mg total) by mouth 3 (three) times daily.  21 tablet  0   No current facility-administered medications for this visit.    PHYSICAL EXAMINATION: ECOG PERFORMANCE STATUS: 1 - Symptomatic but completely ambulatory  Filed Vitals:   07/18/14 0902  BP: 133/70  Pulse: 80  Temp: 98.4 F (36.9 C)  Resp: 18   Filed Weights   07/18/14 0902  Weight: 194 lb 12.8 oz (88.361 kg)    GENERAL:alert, no distress and comfortable. She appears cushingoid SKIN: skin color, texture, turgor are normal, no rashes or significant lesions EYES: normal, Conjunctiva are pink and non-injected, sclera clear OROPHARYNX:no exudate, no erythema and lips, buccal mucosa, and tongue normal  NECK: supple, thyroid normal size, non-tender, without nodularity LYMPH:  no palpable lymphadenopathy in the cervical, axillary or inguinal LUNGS: clear to auscultation and percussion with normal breathing effort HEART: regular rate & rhythm and no murmurs and no lower extremity edema ABDOMEN:abdomen soft, with tenderness in the left lower quadrant without rebound or guarding.  Musculoskeletal:no cyanosis of digits and no clubbing  NEURO: alert & oriented x 3 with fluent speech, no focal motor/sensory deficits  LABORATORY DATA:  I have reviewed the data as listed    Component Value Date/Time   NA 142 07/18/2014 0848   K 3.5 07/18/2014 0848   CO2 28 07/18/2014 0848   GLUCOSE 135  07/18/2014 0848   BUN 10.2 07/18/2014 0848   CREATININE 0.9 07/18/2014 0848   CALCIUM 9.3 07/18/2014 0848   PROT 6.5 07/18/2014 0848   ALBUMIN 3.3* 07/18/2014 0848   AST 30 07/18/2014 0848   ALT 45 07/18/2014 0848   ALKPHOS 47 07/18/2014 0848   BILITOT 0.39 07/18/2014 0848    No results found for this basename: SPEP, UPEP,  kappa and lambda light chains    Lab Results  Component Value Date   WBC 2.7* 07/18/2014   NEUTROABS 1.1* 07/18/2014   HGB 11.3* 07/18/2014   HCT 37.0 07/18/2014   MCV 78.0* 07/18/2014   PLT 193 07/18/2014      Chemistry      Component Value Date/Time   NA 142 07/18/2014 0848   K 3.5 07/18/2014 0848   CO2 28 07/18/2014 0848   BUN 10.2 07/18/2014 0848   CREATININE 0.9 07/18/2014 0848  Component Value Date/Time   CALCIUM 9.3 07/18/2014 0848   ALKPHOS 47 07/18/2014 0848   AST 30 07/18/2014 0848   ALT 45 07/18/2014 0848   BILITOT 0.39 07/18/2014 0848     ASSESSMENT & PLAN:  Multiple myeloma, without mention of having achieved remission Due to recent symptoms suggestive of diverticulitis, I will hold her treatment for now and reassess next week.  Diverticulitis She has signs and symptoms to suggest diverticulitis. I recommend a trial of oral antibiotic therapy. If she is not better, I would recommend CT scan for further assessment. She agreed.  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 The patient denies recent history of bleeding such as epistaxis, hematuria or hematochezia. She is asymptomatic from the anemia. I will observe for now.         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 Continue supportive care and continue to hold Zometa.      DVT (deep venous thrombosis) She is doing well on anticoagulation therapy. She has no bleeding complications. Continue the same.      Leukopenia due to antineoplastic chemotherapy This is likely due to recent treatment. The patient denies recent history of fevers, cough, chills,  diarrhea or dysuria. She is asymptomatic from the leukopenia. I will observe for now.           No orders of the defined types were placed in this encounter.   All questions were answered. The patient knows to call the clinic with any problems, questions or concerns. No barriers to learning was detected. I spent 30 minutes counseling the patient face to face. The total time spent in the appointment was 40 minutes and more than 50% was on counseling and review of test results     [facility], [place], MD 07/18/2014 8:47 PM

## 2014-07-18 NOTE — Telephone Encounter (Signed)
gv and printed appt sched adn avs for pt for Aug...sed added tx.

## 2014-07-18 NOTE — Assessment & Plan Note (Signed)
This is likely due to recent treatment. The patient denies recent history of fevers, cough, chills, diarrhea or dysuria. She is asymptomatic from the leukopenia. I will observe for now.    

## 2014-07-20 LAB — SPEP & IFE WITH QIG
Albumin ELP: 61.8 % (ref 55.8–66.1)
Alpha-1-Globulin: 3.8 % (ref 2.9–4.9)
Alpha-2-Globulin: 9.6 % (ref 7.1–11.8)
Beta 2: 4.1 % (ref 3.2–6.5)
Beta Globulin: 7.6 % — ABNORMAL HIGH (ref 4.7–7.2)
Gamma Globulin: 13.1 % (ref 11.1–18.8)
IgA: 59 mg/dL — ABNORMAL LOW (ref 69–380)
IgG (Immunoglobin G), Serum: 904 mg/dL (ref 690–1700)
IgM, Serum: 42 mg/dL — ABNORMAL LOW (ref 52–322)
M-Spike, %: 0.3 g/dL
Total Protein, Serum Electrophoresis: 5.9 g/dL — ABNORMAL LOW (ref 6.0–8.3)

## 2014-07-20 LAB — KAPPA/LAMBDA LIGHT CHAINS
Kappa free light chain: 1.6 mg/dL (ref 0.33–1.94)
Kappa:Lambda Ratio: 0.84 (ref 0.26–1.65)
Lambda Free Lght Chn: 1.91 mg/dL (ref 0.57–2.63)

## 2014-07-25 ENCOUNTER — Ambulatory Visit (HOSPITAL_BASED_OUTPATIENT_CLINIC_OR_DEPARTMENT_OTHER): Payer: 59 | Admitting: Hematology and Oncology

## 2014-07-25 ENCOUNTER — Other Ambulatory Visit: Payer: Self-pay

## 2014-07-25 ENCOUNTER — Ambulatory Visit: Payer: Self-pay

## 2014-07-25 ENCOUNTER — Encounter: Payer: Self-pay | Admitting: Hematology and Oncology

## 2014-07-25 ENCOUNTER — Ambulatory Visit: Payer: 59

## 2014-07-25 ENCOUNTER — Other Ambulatory Visit (HOSPITAL_BASED_OUTPATIENT_CLINIC_OR_DEPARTMENT_OTHER): Payer: 59

## 2014-07-25 ENCOUNTER — Telehealth: Payer: Self-pay | Admitting: Hematology and Oncology

## 2014-07-25 ENCOUNTER — Telehealth: Payer: Self-pay | Admitting: *Deleted

## 2014-07-25 ENCOUNTER — Encounter: Payer: 59 | Admitting: *Deleted

## 2014-07-25 VITALS — BP 123/73 | HR 94 | Temp 98.4°F | Resp 18 | Ht 67.0 in | Wt 192.5 lb

## 2014-07-25 DIAGNOSIS — T451X5A Adverse effect of antineoplastic and immunosuppressive drugs, initial encounter: Secondary | ICD-10-CM

## 2014-07-25 DIAGNOSIS — D630 Anemia in neoplastic disease: Secondary | ICD-10-CM

## 2014-07-25 DIAGNOSIS — K5732 Diverticulitis of large intestine without perforation or abscess without bleeding: Secondary | ICD-10-CM

## 2014-07-25 DIAGNOSIS — C9000 Multiple myeloma not having achieved remission: Secondary | ICD-10-CM

## 2014-07-25 DIAGNOSIS — D72819 Decreased white blood cell count, unspecified: Secondary | ICD-10-CM

## 2014-07-25 DIAGNOSIS — I82409 Acute embolism and thrombosis of unspecified deep veins of unspecified lower extremity: Secondary | ICD-10-CM

## 2014-07-25 DIAGNOSIS — M8710 Osteonecrosis due to drugs, unspecified bone: Secondary | ICD-10-CM

## 2014-07-25 DIAGNOSIS — I82401 Acute embolism and thrombosis of unspecified deep veins of right lower extremity: Secondary | ICD-10-CM

## 2014-07-25 DIAGNOSIS — D701 Agranulocytosis secondary to cancer chemotherapy: Secondary | ICD-10-CM

## 2014-07-25 DIAGNOSIS — M8708 Idiopathic aseptic necrosis of bone, other site: Secondary | ICD-10-CM

## 2014-07-25 DIAGNOSIS — E876 Hypokalemia: Secondary | ICD-10-CM | POA: Insufficient documentation

## 2014-07-25 LAB — CBC WITH DIFFERENTIAL/PLATELET
BASO%: 0.9 % (ref 0.0–2.0)
Basophils Absolute: 0 10*3/uL (ref 0.0–0.1)
EOS%: 1.7 % (ref 0.0–7.0)
Eosinophils Absolute: 0 10*3/uL (ref 0.0–0.5)
HCT: 38.3 % (ref 34.8–46.6)
HGB: 11.8 g/dL (ref 11.6–15.9)
LYMPH%: 41.7 % (ref 14.0–49.7)
MCH: 24 pg — ABNORMAL LOW (ref 25.1–34.0)
MCHC: 30.8 g/dL — ABNORMAL LOW (ref 31.5–36.0)
MCV: 78 fL — ABNORMAL LOW (ref 79.5–101.0)
MONO#: 0.5 10*3/uL (ref 0.1–0.9)
MONO%: 20 % — ABNORMAL HIGH (ref 0.0–14.0)
NEUT#: 0.8 10*3/uL — ABNORMAL LOW (ref 1.5–6.5)
NEUT%: 35.7 % — ABNORMAL LOW (ref 38.4–76.8)
Platelets: 228 10*3/uL (ref 145–400)
RBC: 4.91 10*6/uL (ref 3.70–5.45)
RDW: 15.3 % — ABNORMAL HIGH (ref 11.2–14.5)
WBC: 2.3 10*3/uL — ABNORMAL LOW (ref 3.9–10.3)
lymph#: 1 10*3/uL (ref 0.9–3.3)

## 2014-07-25 LAB — COMPREHENSIVE METABOLIC PANEL (CC13)
ALT: 39 U/L (ref 0–55)
AST: 25 U/L (ref 5–34)
Albumin: 3.4 g/dL — ABNORMAL LOW (ref 3.5–5.0)
Alkaline Phosphatase: 49 U/L (ref 40–150)
Anion Gap: 9 mEq/L (ref 3–11)
BUN: 7.8 mg/dL (ref 7.0–26.0)
CO2: 26 mEq/L (ref 22–29)
Calcium: 9.1 mg/dL (ref 8.4–10.4)
Chloride: 104 mEq/L (ref 98–109)
Creatinine: 0.9 mg/dL (ref 0.6–1.1)
Glucose: 183 mg/dl — ABNORMAL HIGH (ref 70–140)
Potassium: 3.2 mEq/L — ABNORMAL LOW (ref 3.5–5.1)
Sodium: 139 mEq/L (ref 136–145)
Total Bilirubin: 0.43 mg/dL (ref 0.20–1.20)
Total Protein: 6.6 g/dL (ref 6.4–8.3)

## 2014-07-25 LAB — TSH CHCC: TSH: 0.218 m(IU)/L — ABNORMAL LOW (ref 0.308–3.960)

## 2014-07-25 LAB — LACTATE DEHYDROGENASE (CC13): LDH: 225 U/L (ref 125–245)

## 2014-07-25 NOTE — Telephone Encounter (Signed)
Per staff message and POF I have scheduled appts. Advised scheduler of appts. JMW  

## 2014-07-25 NOTE — Progress Notes (Signed)
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   05/05/2014 Imaging Imaging study of the neck showed no explanation that could cause right neck pain. She is noted to have incidental left upper lung nodule. Plan to repeat imaging study in 3 months.    INTERVAL HISTORY: Please see below for problem oriented charting. Her left lower quadrant pain has resolved. She denies diarrhea. She continues taking anticoagulation therapy. The patient denies any recent signs or symptoms of bleeding such as spontaneous epistaxis, hematuria or hematochezia.   REVIEW OF SYSTEMS:   Constitutional: Denies fevers, chills or abnormal weight loss Eyes: Denies blurriness of vision Ears, nose, mouth, throat, and face: Denies mucositis or sore throat Respiratory: Denies cough, dyspnea or wheezes Cardiovascular: Denies palpitation, chest discomfort or lower extremity swelling Gastrointestinal:  Denies nausea, heartburn or change in bowel habits Skin: Denies abnormal skin rashes Lymphatics: Denies new lymphadenopathy or easy bruising Neurological:Denies numbness, tingling or new weaknesses Behavioral/Psych:  Mood is stable, no new changes  All other systems were reviewed with the patient and are negative.  I have reviewed the past medical history, past surgical history, social history and family history with the patient and they are unchanged from previous note.  ALLERGIES:  has No Known Allergies.  MEDICATIONS:  Current Outpatient Prescriptions  Medication Sig Dispense Refill  . acyclovir (ZOVIRAX) 400 MG tablet Take 1 tablet (400 mg total) by mouth 2 (two) times daily.  60 tablet  6  . calcium carbonate (TUMS - DOSED IN MG ELEMENTAL CALCIUM) 500 MG chewable tablet Chew 1 tablet by mouth as needed for indigestion or heartburn.      . carboxymethylcellulose (REFRESH PLUS) 0.5 % SOLN 1 drop daily as needed.      . Cholecalciferol (VITAMIN D3) 2000 UNITS TABS Take 2,000 Units by mouth daily.      Marland Kitchen dexamethasone (DECADRON) 4 MG tablet Take 2 & 1/2 tab (10 mg) on days 1,2,8,9 every 21 days for cycles 9-12.  40 tablet  0  . hydrochlorothiazide (HYDRODIURIL) 25 MG tablet Take 25 mg by mouth daily with breakfast.       . lenalidomide (REVLIMID) 15 MG capsule Take 1 capsule (15 mg total) by mouth daily. X 14 days on and 7 days off.  14 capsule  0  . levothyroxine (SYNTHROID, LEVOTHROID) 75 MCG tablet Take 75 mcg by mouth daily before breakfast.      . lidocaine-prilocaine (EMLA) cream Apply 1 application topically as needed (port).      . Multiple Vitamins-Minerals (CENTRUM SILVER PO) Take 1 tablet by mouth daily.       Marland Kitchen oxyCODONE 10 MG TABS Take 1 tablet (10 mg total) by mouth every 4 (four) hours as needed for severe  pain.  60 tablet  0  . polyethylene glycol (MIRALAX / GLYCOLAX) packet Take 17 g by mouth daily.      . rivaroxaban (XARELTO) 20 MG TABS tablet Take 1 tablet (20 mg total) by mouth daily with supper.  90 tablet  3  . traMADol (ULTRAM) 50 MG tablet Take 1 tablet (50 mg total) by mouth every 6 (six) hours as needed for moderate pain or severe pain.  60 tablet  0  . venlafaxine XR  (EFFEXOR-XR) 75 MG 24 hr capsule Take 1 capsule by mouth  twice daily  60 capsule  0  . verapamil (VERELAN PM) 120 MG 24 hr capsule Take 1 capsule (120 mg  total) by mouth daily.  90 capsule  0  . prochlorperazine (COMPAZINE) 10 MG tablet Take 1 tablet (10 mg total) by mouth every 6 (six) hours as needed (Nausea or vomiting).  30 tablet  1   No current facility-administered medications for this visit.    PHYSICAL EXAMINATION: ECOG PERFORMANCE STATUS: 0 - Asymptomatic  Filed Vitals:   07/25/14 1002  BP: 123/73  Pulse: 94  Temp: 98.4 F (36.9 C)  Resp: 18   Filed Weights   07/25/14 1002  Weight: 192 lb 8 oz (87.317 kg)    GENERAL:alert, no distress and comfortable SKIN: skin color, texture, turgor are normal, no rashes or significant lesions EYES: normal, Conjunctiva are pink and non-injected, sclera clear OROPHARYNX:no exudate, no erythema and lips, buccal mucosa, and tongue normal . Previous site of osteonecrosis of the jaw has healed NECK: supple, thyroid normal size, non-tender, without nodularity LYMPH:  no palpable lymphadenopathy in the cervical, axillary or inguinal LUNGS: clear to auscultation and percussion with normal breathing effort HEART: regular rate & rhythm and no murmurs and no lower extremity edema ABDOMEN:abdomen soft, non-tender and normal bowel sounds. Prior site of tenderness has resolved. Musculoskeletal:no cyanosis of digits and no clubbing  NEURO: alert & oriented x 3 with fluent speech, no focal motor/sensory deficits  LABORATORY DATA:  I have reviewed the data as listed    Component Value Date/Time   NA 139 07/25/2014 0946   K 3.2* 07/25/2014 0946   CO2 26 07/25/2014 0946   GLUCOSE 183* 07/25/2014 0946   BUN 7.8 07/25/2014 0946   CREATININE 0.9 07/25/2014 0946   CALCIUM 9.1 07/25/2014 0946   PROT 6.6 07/25/2014 0946   ALBUMIN 3.4* 07/25/2014 0946   AST 25 07/25/2014 0946   ALT 39 07/25/2014 0946   ALKPHOS 49 07/25/2014 0946   BILITOT 0.43 07/25/2014  0946    No results found for this basename: SPEP, UPEP,  kappa and lambda light chains    Lab Results  Component Value Date   WBC 2.3* 07/25/2014   NEUTROABS 0.8* 07/25/2014   HGB 11.8 07/25/2014   HCT 38.3 07/25/2014   MCV 78.0* 07/25/2014   PLT 228 07/25/2014      Chemistry      Component Value Date/Time   NA 139 07/25/2014 0946   K 3.2* 07/25/2014 0946   CO2 26 07/25/2014 0946   BUN 7.8 07/25/2014 0946   CREATININE 0.9 07/25/2014 0946      Component Value Date/Time   CALCIUM 9.1 07/25/2014 0946   ALKPHOS 49 07/25/2014 0946   AST 25 07/25/2014 0946   ALT 39 07/25/2014 0946   BILITOT 0.43 07/25/2014 0946     ASSESSMENT & PLAN:  Multiple myeloma, without mention of having achieved remission Her blood counts are still too low to  start treatment. We will continue holding off treatment this week and reschedule a visit appointments along with blood work next week.  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 The patient denies recent history of bleeding such as epistaxis, hematuria or hematochezia. She is asymptomatic from the anemia. I will observe for now.           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 I would defer to her dentist for further management. I recommend continue holding off the Zometa treatment.  Leukopenia due to antineoplastic chemotherapy This is likely due to recent treatment. The patient denies recent history of fevers, cough, chills, diarrhea or dysuria. She is asymptomatic from the leukopenia. I will observe for now.            DVT (deep venous thrombosis) She is doing well on anticoagulation therapy. She has no bleeding complications. Continue the same.        Diverticulitis This has resolved. I recommend holding off a report of this for another week to reduce risk of diarrhea.  Hypokalemia This likely due to recent diarrhea. I recommend potassium rich diet.      All  questions were answered. The patient knows to call the clinic with any problems, questions or concerns. No barriers to learning was detected. I spent 30 minutes counseling the patient face to face. The total time spent in the appointment was 40 minutes and more than 50% was on counseling and review of test results     [facility], [place], MD 07/25/2014 1:42 PM

## 2014-07-25 NOTE — Progress Notes (Signed)
07/25/2014 Patient in this morning for evaluation prior to receiving induction treatment Cycle 11. Patient reports improvement in her GI symptoms of pain, though with some ongoing loose stools, following completion of one week of dual antibiotic therapy. While patient condition otherwise meets criteria for continued treatment, ANC is low today at 800 10e3/uL (grade 3 neutropenia). Accordingly, treatment will be held an additional week, and patient is aware that she should not begin dosing with lenalidomide and dexamethasone at this time. [name] BSN, RN, [place] 07/25/2014 5:00 PM

## 2014-07-25 NOTE — Telephone Encounter (Signed)
gv and printed appt sched and avs for pt for Sept...emailed MW to add tx.

## 2014-07-25 NOTE — Assessment & Plan Note (Signed)
This is likely due to recent treatment. The patient denies recent history of fevers, cough, chills, diarrhea or dysuria. She is asymptomatic from the leukopenia. I will observe for now.    

## 2014-07-25 NOTE — Assessment & Plan Note (Signed)
This is stable and continues to improve. I would defer to her dentist for further management. I recommend continue holding off the Zometa treatment.

## 2014-07-25 NOTE — Assessment & Plan Note (Signed)
Her blood counts are still too low to start treatment. We will continue holding off treatment this week and reschedule a visit appointments along with blood work next week.

## 2014-07-25 NOTE — Assessment & Plan Note (Signed)
This has resolved. I recommend holding off a report of this for another week to reduce risk of diarrhea.

## 2014-07-25 NOTE — Assessment & Plan Note (Signed)
This is likely due to recent treatment. The patient denies recent history of bleeding such as epistaxis, hematuria or hematochezia. She is asymptomatic from the anemia. I will observe for now.   

## 2014-07-25 NOTE — Assessment & Plan Note (Signed)
This likely due to recent diarrhea. I recommend potassium rich diet.

## 2014-07-25 NOTE — Assessment & Plan Note (Signed)
She is doing well on anticoagulation therapy. She has no bleeding complications. Continue the same.

## 2014-07-26 ENCOUNTER — Telehealth: Payer: Self-pay | Admitting: *Deleted

## 2014-07-26 NOTE — Telephone Encounter (Signed)
Per staff message and POF I have scheduled appts. Advised scheduler of appts. JMW  

## 2014-08-01 ENCOUNTER — Telehealth: Payer: Self-pay | Admitting: *Deleted

## 2014-08-01 ENCOUNTER — Telehealth: Payer: Self-pay | Admitting: Hematology and Oncology

## 2014-08-01 ENCOUNTER — Ambulatory Visit (HOSPITAL_BASED_OUTPATIENT_CLINIC_OR_DEPARTMENT_OTHER): Payer: 59

## 2014-08-01 ENCOUNTER — Other Ambulatory Visit (HOSPITAL_BASED_OUTPATIENT_CLINIC_OR_DEPARTMENT_OTHER): Payer: 59

## 2014-08-01 ENCOUNTER — Encounter: Payer: 59 | Admitting: *Deleted

## 2014-08-01 ENCOUNTER — Other Ambulatory Visit: Payer: Self-pay | Admitting: *Deleted

## 2014-08-01 ENCOUNTER — Ambulatory Visit (HOSPITAL_BASED_OUTPATIENT_CLINIC_OR_DEPARTMENT_OTHER): Payer: 59 | Admitting: Hematology and Oncology

## 2014-08-01 ENCOUNTER — Encounter: Payer: Self-pay | Admitting: Hematology and Oncology

## 2014-08-01 VITALS — BP 128/72 | HR 97 | Temp 98.2°F | Resp 18 | Ht 67.0 in | Wt 189.1 lb

## 2014-08-01 DIAGNOSIS — M8710 Osteonecrosis due to drugs, unspecified bone: Secondary | ICD-10-CM

## 2014-08-01 DIAGNOSIS — Z5112 Encounter for antineoplastic immunotherapy: Secondary | ICD-10-CM

## 2014-08-01 DIAGNOSIS — D630 Anemia in neoplastic disease: Secondary | ICD-10-CM

## 2014-08-01 DIAGNOSIS — C9000 Multiple myeloma not having achieved remission: Secondary | ICD-10-CM

## 2014-08-01 DIAGNOSIS — M8700 Idiopathic aseptic necrosis of unspecified bone: Secondary | ICD-10-CM

## 2014-08-01 DIAGNOSIS — D701 Agranulocytosis secondary to cancer chemotherapy: Secondary | ICD-10-CM

## 2014-08-01 DIAGNOSIS — D72819 Decreased white blood cell count, unspecified: Secondary | ICD-10-CM

## 2014-08-01 DIAGNOSIS — T451X5A Adverse effect of antineoplastic and immunosuppressive drugs, initial encounter: Secondary | ICD-10-CM

## 2014-08-01 DIAGNOSIS — I82409 Acute embolism and thrombosis of unspecified deep veins of unspecified lower extremity: Secondary | ICD-10-CM

## 2014-08-01 LAB — COMPREHENSIVE METABOLIC PANEL (CC13)
ALT: 40 U/L (ref 0–55)
AST: 27 U/L (ref 5–34)
Albumin: 3.5 g/dL (ref 3.5–5.0)
Alkaline Phosphatase: 44 U/L (ref 40–150)
Anion Gap: 11 mEq/L (ref 3–11)
BUN: 9.6 mg/dL (ref 7.0–26.0)
CO2: 25 mEq/L (ref 22–29)
Calcium: 9.3 mg/dL (ref 8.4–10.4)
Chloride: 106 mEq/L (ref 98–109)
Creatinine: 0.9 mg/dL (ref 0.6–1.1)
Glucose: 156 mg/dl — ABNORMAL HIGH (ref 70–140)
Potassium: 3.2 mEq/L — ABNORMAL LOW (ref 3.5–5.1)
Sodium: 142 mEq/L (ref 136–145)
Total Bilirubin: 0.38 mg/dL (ref 0.20–1.20)
Total Protein: 6.8 g/dL (ref 6.4–8.3)

## 2014-08-01 LAB — CBC WITH DIFFERENTIAL/PLATELET
BASO%: 0.6 % (ref 0.0–2.0)
Basophils Absolute: 0 10*3/uL (ref 0.0–0.1)
EOS%: 1.6 % (ref 0.0–7.0)
Eosinophils Absolute: 0.1 10*3/uL (ref 0.0–0.5)
HCT: 37.1 % (ref 34.8–46.6)
HGB: 11.7 g/dL (ref 11.6–15.9)
LYMPH%: 44.1 % (ref 14.0–49.7)
MCH: 24.4 pg — ABNORMAL LOW (ref 25.1–34.0)
MCHC: 31.5 g/dL (ref 31.5–36.0)
MCV: 77.3 fL — ABNORMAL LOW (ref 79.5–101.0)
MONO#: 0.4 10*3/uL (ref 0.1–0.9)
MONO%: 12.2 % (ref 0.0–14.0)
NEUT#: 1.3 10*3/uL — ABNORMAL LOW (ref 1.5–6.5)
NEUT%: 41.5 % (ref 38.4–76.8)
Platelets: 279 10*3/uL (ref 145–400)
RBC: 4.8 10*6/uL (ref 3.70–5.45)
RDW: 14.8 % — ABNORMAL HIGH (ref 11.2–14.5)
WBC: 3.1 10*3/uL — ABNORMAL LOW (ref 3.9–10.3)
lymph#: 1.4 10*3/uL (ref 0.9–3.3)

## 2014-08-01 LAB — LACTATE DEHYDROGENASE (CC13): LDH: 238 U/L (ref 125–245)

## 2014-08-01 MED ORDER — ONDANSETRON HCL 8 MG PO TABS
ORAL_TABLET | ORAL | Status: AC
  Filled 2014-08-01: qty 1

## 2014-08-01 MED ORDER — ONDANSETRON HCL 8 MG PO TABS
8.0000 mg | ORAL_TABLET | Freq: Once | ORAL | Status: AC
  Administered 2014-08-01: 8 mg via ORAL

## 2014-08-01 MED ORDER — INV-BORTEZOMIB CHEMO SQ INJECTION 3.5 MG CTSU E1A11
1.0000 mg/m2 | Freq: Once | INTRAVENOUS | Status: AC
  Administered 2014-08-01: 2 mg via SUBCUTANEOUS
  Filled 2014-08-01: qty 2

## 2014-08-01 MED ORDER — LENALIDOMIDE 15 MG PO CAPS: 15.0000 mg | ORAL_CAPSULE | Freq: Every day | ORAL | Status: DC

## 2014-08-01 NOTE — Telephone Encounter (Signed)
THIS REFILL REQUEST FOR REVLIMID WAS PLACED ON DR.GORSUCH'S DESK. 

## 2014-08-01 NOTE — Progress Notes (Signed)
08/01/2014 Patient in to clinic today for evaluation following two week treatment delay, first due to diverticulitis, then neutropenia. Per 07/26/2014 email correspondence with study chair, Rodena Goldmann MD, patient may resume treatment if [place] reaches 1.0 10e3/uL. Additionally dose modification is not required if neutropenia is unrelated to study treatment. Patient's ANC today is 1.3. Per Dr. Alvy Bimler, dose modification is not indicated at this time, as it is felt that neutropenia was related to prior infection. At worst, patient's diarrhea during the previous cycle was fewer than 4 stools per 24 hours (grade 1). Blank cycle 11 Patient Medication Calendar was given to patient for completion. She will take day 1 doses of lenalidomide and dexamethasone when she returns home today. It has been noted that patient will have one less dose of lenalidomide for this cycle, due to early dosing of previously planned cycle 11, day 1 on 8/18. Dr. Alvy Bimler is aware and feels that it is not necessary to replace the single dose of lenalidomide. Patient will mark the calendar accordingly. Based on lab results review and history and physical by Dr. Alvy Bimler, patient condition is felt to be acceptable for continued treatment. Sign for injection given to Ann Lions, RN. [name] BSN, RN, [place] 08/01/2014 12:02 PM

## 2014-08-01 NOTE — Progress Notes (Signed)
Per Jenny Reichmann research RN and Dr. Alvy Bimler, okay to tx with ANC-1.3

## 2014-08-01 NOTE — Addendum Note (Signed)
Addended by: Wyonia Hough on: 08/01/2014 05:42 PM   Modules accepted: Orders

## 2014-08-01 NOTE — Patient Instructions (Signed)
Cancer Center Discharge Instructions for Patients Receiving Chemotherapy  Today you received the following chemotherapy agents: Velcade  To help prevent nausea and vomiting after your treatment, we encourage you to take your nausea medication as prescribed by your physician.   If you develop nausea and vomiting that is not controlled by your nausea medication, call the clinic.   BELOW ARE SYMPTOMS THAT SHOULD BE REPORTED IMMEDIATELY:  *FEVER GREATER THAN 100.5 F  *CHILLS WITH OR WITHOUT FEVER  NAUSEA AND VOMITING THAT IS NOT CONTROLLED WITH YOUR NAUSEA MEDICATION  *UNUSUAL SHORTNESS OF BREATH  *UNUSUAL BRUISING OR BLEEDING  TENDERNESS IN MOUTH AND THROAT WITH OR WITHOUT PRESENCE OF ULCERS  *URINARY PROBLEMS  *BOWEL PROBLEMS  UNUSUAL RASH Items with * indicate a potential emergency and should be followed up as soon as possible.  Feel free to call the clinic you have any questions or concerns. The clinic phone number is [phone].    

## 2014-08-01 NOTE — Assessment & Plan Note (Signed)
This is likely due to recent treatment. The patient denies recent history of fevers, cough, chills, diarrhea or dysuria. She is asymptomatic from the leukopenia. I will observe for now.    

## 2014-08-01 NOTE — Assessment & Plan Note (Signed)
She is doing well on anticoagulation therapy. She has no bleeding complications. Continue the same.

## 2014-08-01 NOTE — Progress Notes (Signed)
[facility] OFFICE PROGRESS NOTE  Patient Care Team: Wenda Low, MD as PCP - General (Internal Medicine) Heath Lark, MD as Consulting Physician (Hematology and Oncology)  SUMMARY OF ONCOLOGIC HISTORY: Oncology History   ISS stage 1 IgG lambda subtype (serum albumin 3.6, Beta2 microglobulin 2.32) Durie Salmon Stage 1     Multiple myeloma, without mention of having achieved remission   10/10/2013 Imaging Skeletal survery was negative   11/09/2013 Bone Marrow Biopsy BM biopsy confirmed myeloma, 76% involved, IgG lambda subtype   12/06/2013 -  Chemotherapy Start cycle 1 of chemo with revlimid, Velcade, Dexamethasone and Zometa. 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   05/05/2014 Imaging Imaging study of the neck showed no explanation that could cause right neck pain. She is noted to have incidental left upper lung nodule. Plan to repeat imaging study in 3 months.    INTERVAL HISTORY: Please see below for problem oriented charting. She feels well. Denies fevers or chills. No change in recent bowel habits.  REVIEW OF SYSTEMS:   Constitutional: Denies fevers, chills or abnormal weight loss Eyes: Denies blurriness of vision Ears, nose, mouth, throat, and face: Denies mucositis or sore throat Respiratory: Denies cough, dyspnea or wheezes Cardiovascular: Denies palpitation, chest discomfort or lower extremity swelling Gastrointestinal:  Denies nausea, heartburn or change in bowel habits Skin: Denies abnormal skin rashes Lymphatics: Denies new lymphadenopathy or easy bruising Neurological:Denies numbness, tingling or new weaknesses Behavioral/Psych: Mood is stable, no new changes  All other systems were reviewed with the patient and are negative.  I have reviewed the past medical history, past surgical  history, social history and family history with the patient and they are unchanged from previous note.  ALLERGIES:  has No Known Allergies.  MEDICATIONS:  Current Outpatient Prescriptions  Medication Sig Dispense Refill  . acyclovir (ZOVIRAX) 400 MG tablet Take 1 tablet (400 mg total) by mouth 2 (two) times daily.  60 tablet  6  . calcium carbonate (TUMS - DOSED IN MG ELEMENTAL CALCIUM) 500 MG chewable tablet Chew 1 tablet by mouth as needed for indigestion or heartburn.      . carboxymethylcellulose (REFRESH PLUS) 0.5 % SOLN 1 drop daily as needed.      . Cholecalciferol (VITAMIN D3) 2000 UNITS TABS Take 2,000 Units by mouth daily.      Marland Kitchen dexamethasone (DECADRON) 4 MG tablet Take 2 & 1/2 tab (10 mg) on days 1,2,8,9 every 21 days for cycles 9-12.  40 tablet  0  . hydrochlorothiazide (HYDRODIURIL) 25 MG tablet Take 25 mg by mouth daily with breakfast.       . lenalidomide (REVLIMID) 15 MG capsule Take 1 capsule (15 mg total) by mouth daily. X 14 days on and 7 days off.  14 capsule  0  . levothyroxine (SYNTHROID, LEVOTHROID) 75 MCG tablet Take 75 mcg by mouth daily before breakfast.      . lidocaine-prilocaine (EMLA) cream Apply 1 application topically as needed (port).      . Multiple Vitamins-Minerals (CENTRUM SILVER PO) Take 1 tablet by mouth daily.       Marland Kitchen oxyCODONE 10 MG TABS Take 1 tablet (10 mg total) by mouth every 4 (four) hours as needed for severe pain.  60 tablet  0  . polyethylene glycol (MIRALAX / GLYCOLAX) packet Take 17 g by mouth daily.      Marland Kitchen  prochlorperazine (COMPAZINE) 10 MG tablet Take 1 tablet (10 mg total) by mouth every 6 (six) hours as needed (Nausea or vomiting).  30 tablet  1  . rivaroxaban (XARELTO) 20 MG TABS tablet Take 1 tablet (20 mg total) by mouth daily with supper.  90 tablet  3  . traMADol (ULTRAM) 50 MG tablet Take 1 tablet (50 mg total) by mouth every 6 (six) hours as needed for moderate pain or severe pain.  60 tablet  0  . venlafaxine XR (EFFEXOR-XR) 75 MG  24 hr capsule Take 1 capsule by mouth  twice daily  60 capsule  0  . verapamil (VERELAN PM) 120 MG 24 hr capsule Take 1 capsule (120 mg  total) by mouth daily.  90 capsule  0   No current facility-administered medications for this visit.    PHYSICAL EXAMINATION: ECOG PERFORMANCE STATUS: 1 - Symptomatic but completely ambulatory  Filed Vitals:   08/01/14 1037  BP: 128/72  Pulse: 97  Temp: 98.2 F (36.8 C)  Resp: 18   Filed Weights   08/01/14 1037  Weight: 189 lb 1.6 oz (85.775 kg)    GENERAL:alert, no distress and comfortable SKIN: skin color, texture, turgor are normal, no rashes or significant lesions EYES: normal, Conjunctiva are pink and non-injected, sclera clear OROPHARYNX:no exudate, no erythema and lips, buccal mucosa, and tongue normal  NECK: supple, thyroid normal size, non-tender, without nodularity LYMPH:  no palpable lymphadenopathy in the cervical, axillary or inguinal LUNGS: clear to auscultation and percussion with normal breathing effort HEART: regular rate & rhythm and no murmurs and no lower extremity edema ABDOMEN:abdomen soft, non-tender and normal bowel sounds Musculoskeletal:no cyanosis of digits and no clubbing  NEURO: alert & oriented x 3 with fluent speech, no focal motor/sensory deficits  LABORATORY DATA:  I have reviewed the data as listed    Component Value Date/Time   NA 139 07/25/2014 0946   K 3.2* 07/25/2014 0946   CO2 26 07/25/2014 0946   GLUCOSE 183* 07/25/2014 0946   BUN 7.8 07/25/2014 0946   CREATININE 0.9 07/25/2014 0946   CALCIUM 9.1 07/25/2014 0946   PROT 6.6 07/25/2014 0946   ALBUMIN 3.4* 07/25/2014 0946   AST 25 07/25/2014 0946   ALT 39 07/25/2014 0946   ALKPHOS 49 07/25/2014 0946   BILITOT 0.43 07/25/2014 0946    No results found for this basename: SPEP, UPEP,  kappa and lambda light chains    Lab Results  Component Value Date   WBC 3.1* 08/01/2014   NEUTROABS 1.3* 08/01/2014   HGB 11.7 08/01/2014   HCT 37.1 08/01/2014   MCV 77.3*  08/01/2014   PLT 279 08/01/2014      Chemistry      Component Value Date/Time   NA 139 07/25/2014 0946   K 3.2* 07/25/2014 0946   CO2 26 07/25/2014 0946   BUN 7.8 07/25/2014 0946   CREATININE 0.9 07/25/2014 0946      Component Value Date/Time   CALCIUM 9.1 07/25/2014 0946   ALKPHOS 49 07/25/2014 0946   AST 25 07/25/2014 0946   ALT 39 07/25/2014 0946   BILITOT 0.43 07/25/2014 0946       ASSESSMENT & PLAN:  Multiple myeloma, without mention of having achieved remission Her blood counts have recovered. I will proceed with treatment without dosage adjustment. I plan on doing a bone marrow biopsy to assess stage of disease response rate. Hopefully, she can go on maintenance Revlimid in the future.  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 I would defer to her dentist for further management. I recommend continue holding off the Zometa treatment.    Leukopenia due to antineoplastic chemotherapy This is likely due to recent treatment. The patient denies recent history of fevers, cough, chills, diarrhea or dysuria. She is asymptomatic from the leukopenia. I will observe for now.              DVT (deep venous thrombosis) She is doing well on anticoagulation therapy. She has no bleeding complications. Continue the same.          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 The patient denies recent history of bleeding such as epistaxis, hematuria or hematochezia. She is asymptomatic from the anemia. I will observe for now.              No orders of the defined types were placed in this encounter.   All questions were answered. The patient knows to call the clinic with any problems, questions or concerns. No barriers to learning was detected. I spent 25 minutes counseling the patient face to face. The total time spent in the appointment was 30 minutes and more than 50% was on counseling and review of test  results     [facility], Erda, MD 08/01/2014 11:03 AM

## 2014-08-01 NOTE — Telephone Encounter (Signed)
Per staff message and POF I have scheduled appts. Advised scheduler of appts. JMW  

## 2014-08-01 NOTE — Telephone Encounter (Signed)
, °

## 2014-08-01 NOTE — Assessment & Plan Note (Signed)
This is stable and continues to improve. I would defer to her dentist for further management. I recommend continue holding off the Zometa treatment.

## 2014-08-01 NOTE — Assessment & Plan Note (Signed)
Her blood counts have recovered. I will proceed with treatment without dosage adjustment. I plan on doing a bone marrow biopsy to assess stage of disease response rate. Hopefully, she can go on maintenance Revlimid in the future.

## 2014-08-01 NOTE — Assessment & Plan Note (Signed)
This is likely due to recent treatment. The patient denies recent history of bleeding such as epistaxis, hematuria or hematochezia. She is asymptomatic from the anemia. I will observe for now.   

## 2014-08-02 ENCOUNTER — Ambulatory Visit (INDEPENDENT_AMBULATORY_CARE_PROVIDER_SITE_OTHER): Payer: 59 | Admitting: Neurology

## 2014-08-02 ENCOUNTER — Encounter: Payer: Self-pay | Admitting: Neurology

## 2014-08-02 VITALS — BP 120/68 | HR 97 | Ht 67.5 in | Wt 191.0 lb

## 2014-08-02 DIAGNOSIS — R51 Headache: Secondary | ICD-10-CM

## 2014-08-02 MED ORDER — VENLAFAXINE HCL ER 37.5 MG PO CP24: 37.5000 mg | ORAL_CAPSULE | Freq: Every day | ORAL | Status: DC

## 2014-08-02 NOTE — Patient Instructions (Signed)
Overall you are doing fairly well but I do want to suggest a few things today:   Remember to drink plenty of fluid, eat healthy meals and do not skip any meals. Try to eat protein with a every meal and eat a healthy snack such as fruit or nuts in between meals. Try to keep a regular sleep-wake schedule and try to exercise daily, particularly in the form of walking, 20-30 minutes a day, if you can.   As far as your medications are concerned, I would like to suggest the following: 1)Please decrease Effexor XR to 37.5mg  daily. A new prescription has been sent.  Once you have completed your course of chemotherapy please call us so we can get you set up for Botox injections  You will follow up with Dr Krista Blue for injections in the future.   I would like you to have a MRI of the brain. You will be called to schedule this.   Please call us with any interim questions, concerns, problems, updates or refill requests.   Please also call us for any test results so we can go over those with you on the phone.  My clinical assistant and will answer any of your questions and relay your messages to me and also relay most of my messages to you.   Our phone number is [phone]. We also have an after hours call service for urgent matters and there is a physician on-call for urgent questions. For any emergencies you know to call 911 or go to the nearest emergency room

## 2014-08-02 NOTE — Telephone Encounter (Signed)
RECEIVED A FAX FROM [employer] CONCERNING A CONFIRMATION OF FACSIMILE RECEIPT FOR PT. REFERRAL. 

## 2014-08-02 NOTE — Progress Notes (Signed)
Provider:  Dr Janann Colonel Referring Provider: Wenda Low, MD Primary Care Physician:  Wenda Low, MD  Chief Complaint  Patient presents with  . Follow-up    rm 16  . Migraine    HPI:  Carla Perry is a 58 y.o. female, former Dr Erling Cruz patient, here as a follow up appointment for chronic migraines with last visit being on 08/09/2013 at which time she received Botox injections. She notes good benefit from these injections. Did not follow up for repeat injections. Since last visit she was unfortunately diagnosed with multiple myeloma and has been undergoing treatment with oncology. Reports so far things are going well. She has one more cycle and then she is finished. She reports she has held off on the Botox injections per recommendation of her oncologist, instructed she can restart once she is done with chemotherapy.   Continues to have headaches. Typically will have 1 to 3 headaches per month. Takes tramadol and falls asleep, when she wakes up the headache is gone. Describes it is a predominantly right sided temporal squeezing pounding type pain. + Nausea, no emesis, + photo and phonophobia. Headaches last 6 hours to all day. Notes she gets weak on the left side with the headaches and has baseline numbness on the left side of her body. Reports she has not had a MRI brain in years. She notes Dr Erling Cruz started her on Effexor and Verapamil. She wishes to get off both of these medications as she feels they are not helping.   Recently saw eye doctor for right eye pain, told she may have glaucoma in her right eye, they will continue to monitor. They also told her that she has dry eyes, was given lubricating eye drops. The eye drops have improved the eye pain.   Initial visit 05/2013: Former Dr Erling Cruz patient. She reports being overall stable. Continues to have daily migrainous headache which have been ongoing for greater then 4 years. Predominantly R sided temporal, described as squeezing and pounding. These  headaches are her baseline headache and are occuring on a near continuous pattern every day of the month. Is also having severe "flare ups" where the pain worsens and becomes a more sharp/intense pain. These episodes are occuring around 8 to 10 times per month. With these headaches she continues to have episodes of transient unilateral (predominantly R sided) weakness that self resolves. Also notes occasional unilateral sensory changes (again R sided predominant). Also has episodes of transient vertigo associated with the headache, has occurred 2 times in the past month, requiring her to miss work. She has had multiple MRIs during these events to rule out stroke and no ischemic changes have been found. Has been unable to tolerate most abortive agents and due to presence of hemiplegic/basilar symptoms she is not a candidate for triptans or DHE. In regards to prophylactic agents she has tried for >3-4 months the following agents and failed/did not tolerate: Topamax (AED), Propranolol (BB), verapamil (CCB) and Effexor (SNRI). Remains on verapmil and Effexor per Dr Erling Cruz but no benefit noted. Denies any family history of hemiplegic migraine.   Continues to have subjective memory difficulties, notes a "fogginess". Has had formal neuro-psych testing which showed an improvement in her overall cognitive function. Continues to have episodes of auditory hallucinations. Has not seen a psychiatrist for this.    Review of Systems: Out of a complete 14 system review, the patient complains of only the following symptoms, and all other reviewed systems are negative. + for anemia, feeling  hot, memory loss, HA, numbness, dizziness, depression, disinterest History   Social History  . Marital Status: Married    Spouse Name: Arnell Sieving    Number of Children: 2  . Years of Education: 14   Occupational History  .  Lab [employer]   Social History Main Topics  . Smoking status: Never Smoker   . Smokeless tobacco: Never Used  .  Alcohol Use: No  . Drug Use: No  . Sexual Activity: Not on file   Other Topics Concern  . Not on file   Social History Narrative   Patient lives at home with her husband Special educational needs teacher). Patient has two years college.   Right handed.   Caffeine- None    Family History  Problem Relation Age of Onset  . Throat cancer Father   . Stroke Father   . Cancer Brother     prostate    Past Medical History  Diagnosis Date  . HBP (high blood pressure)   . Thyroid disorder   . Migraine   . Memory loss   . Anemia   . MGUS (monoclonal gammopathy of unknown significance)   . MGUS (monoclonal gammopathy of unknown significance) 10/06/2013  . Anemia, unspecified 10/06/2013  . Bone pain 10/21/2013  . Multiple myeloma, without mention of having achieved remission 11/16/2013  . Seizure 1960    single seizure episode at age 89  . Leukopenia due to antineoplastic chemotherapy 12/27/2013  . Right leg swelling 02/07/2014  . DVT (deep venous thrombosis) 02/07/2014  . Diverticulitis 07/18/2014    Past Surgical History  Procedure Laterality Date  . Hemorrhoid surgery      Current Outpatient Prescriptions  Medication Sig Dispense Refill  . acyclovir (ZOVIRAX) 400 MG tablet Take 1 tablet (400 mg total) by mouth 2 (two) times daily.  60 tablet  6  . calcium carbonate (TUMS - DOSED IN MG ELEMENTAL CALCIUM) 500 MG chewable tablet Chew 1 tablet by mouth as needed for indigestion or heartburn.      . carboxymethylcellulose (REFRESH PLUS) 0.5 % SOLN 1 drop daily as needed.      . Cholecalciferol (VITAMIN D3) 2000 UNITS TABS Take 2,000 Units by mouth daily.      Marland Kitchen dexamethasone (DECADRON) 4 MG tablet Take 2 & 1/2 tab (10 mg) on days 1,2,8,9 every 21 days for cycles 9-12.  40 tablet  0  . hydrochlorothiazide (HYDRODIURIL) 25 MG tablet Take 25 mg by mouth daily with breakfast.       . lenalidomide (REVLIMID) 15 MG capsule Take 1 capsule (15 mg total) by mouth daily. X 14 days on and 7 days off.  14 capsule  0  .  levothyroxine (SYNTHROID, LEVOTHROID) 75 MCG tablet Take 75 mcg by mouth daily before breakfast.      . lidocaine-prilocaine (EMLA) cream Apply 1 application topically as needed (port).      . Multiple Vitamins-Minerals (CENTRUM SILVER PO) Take 1 tablet by mouth daily.       Marland Kitchen oxyCODONE 10 MG TABS Take 1 tablet (10 mg total) by mouth every 4 (four) hours as needed for severe pain.  60 tablet  0  . polyethylene glycol (MIRALAX / GLYCOLAX) packet Take 17 g by mouth daily.      . prochlorperazine (COMPAZINE) 10 MG tablet Take 1 tablet (10 mg total) by mouth every 6 (six) hours as needed (Nausea or vomiting).  30 tablet  1  . rivaroxaban (XARELTO) 20 MG TABS tablet Take 1 tablet (20 mg total)  by mouth daily with supper.  90 tablet  3  . traMADol (ULTRAM) 50 MG tablet Take 1 tablet (50 mg total) by mouth every 6 (six) hours as needed for moderate pain or severe pain.  60 tablet  0  . venlafaxine XR (EFFEXOR-XR) 75 MG 24 hr capsule Take 1 capsule by mouth  twice daily  60 capsule  0  . verapamil (VERELAN PM) 120 MG 24 hr capsule Take 1 capsule (120 mg  total) by mouth daily.  90 capsule  0   No current facility-administered medications for this visit.    Allergies as of 08/02/2014  . (No Known Allergies)    Vitals: BP 120/68  Pulse 97  Ht 5' 7.5" (1.715 m)  Wt 191 lb (86.637 kg)  BMI 29.46 kg/m2 Last Weight:  Wt Readings from Last 1 Encounters:  08/02/14 191 lb (86.637 kg)   Last Height:   Ht Readings from Last 1 Encounters:  08/02/14 5' 7.5" (1.715 m)     Physical exam: Exam: Gen: NAD, conversant Eyes: anicteric sclerae, moist conjunctivae HENT: Atraumati Lungs: CTA, no wheezing, rales, rhonic                          CV: RRR, no MRG Abdomen: Soft, non-tender;  Extremities: No peripheral edema  Skin: Normal temperature, no rash,  Psych: Appropriate affect, pleasant  Neuro: Carla: AA&Ox3, appropriately interactive, normal affect   MMSE 29/30 at prior visit  CN: PERRL, EOMI  no nystagmus, VFF to FC, no temporal tenderness, no ptosis, LT splits midline at V3, , face symmetric, no weakness, hearing grossly intact, palate elevates symmetrically, shoulder shrug 5/5 bilat,  tongue protrudes midline, no fasiculations noted.  Motor: normal bulk and tone, no drift noted, notes some tenderness in bilat trapezius region Strength: 5/5  In all extremities  Coord: rapid alternating and point-to-point (FNF, HTS) movements intact.  Reflexes: symmetrical, bilat downgoing toes  Sens: LT intact in all extremities  Gait: posture, stance, stride and arm-swing normal. .   Assessment:  After physical and neurologic examination, review of laboratory studies, imaging, neurophysiology testing and pre-existing records, assessment will be reviewed on the problem list.  Plan:  Treatment plan and additional workup will be reviewed under Problem List.  1)Chronic migraine 2)Multiple myeloma  Carla Perry is a pleasant 58y/ woman with a hx of chronic migraine presenting for routine follow up. Since last visit she was diagnosed with multiple myeloma and is currently undergoing treatment for this. Reports she is responding well to the treatment. Per her oncologist, she has held off on Botox until she is finished with chemotherapy. Has been averaging 1 to 3 headaches per month, typically takes tramadol and falls asleep, with resolution of headache upon waking. Will plan to restart botox injections once she completes chemotherapy. With recent diagnosis of multiple myeloma will check brain MRI to r/o structural etiology of chronic headaches. Follow up with Dr Krista Blue for Botox injections. She was started on Effexor and Verapamil by Dr Erling Cruz in the past and wishes to taper off these medications as she notes no benefit. Will decrease Effexor XR to 37.23m daily and plan to stop at next office visit. Can plan to start tapering down verapamil at next office visit.

## 2014-08-03 ENCOUNTER — Other Ambulatory Visit: Payer: Self-pay | Admitting: *Deleted

## 2014-08-03 DIAGNOSIS — D630 Anemia in neoplastic disease: Secondary | ICD-10-CM

## 2014-08-03 LAB — SPEP & IFE WITH QIG
Albumin ELP: 57.6 % (ref 55.8–66.1)
Alpha-1-Globulin: 8.6 % — ABNORMAL HIGH (ref 2.9–4.9)
Alpha-2-Globulin: 9.2 % (ref 7.1–11.8)
Beta 2: 3.6 % (ref 3.2–6.5)
Beta Globulin: 7 % (ref 4.7–7.2)
Gamma Globulin: 14 % (ref 11.1–18.8)
IgA: 70 mg/dL (ref 69–380)
IgG (Immunoglobin G), Serum: 1030 mg/dL (ref 690–1700)
IgM, Serum: 43 mg/dL — ABNORMAL LOW (ref 52–322)
M-Spike, %: 0.32 g/dL
Total Protein, Serum Electrophoresis: 6.4 g/dL (ref 6.0–8.3)

## 2014-08-03 LAB — KAPPA/LAMBDA LIGHT CHAINS
Kappa free light chain: 1.02 mg/dL (ref 0.33–1.94)
Kappa:Lambda Ratio: 0.65 (ref 0.26–1.65)
Lambda Free Lght Chn: 1.58 mg/dL (ref 0.57–2.63)

## 2014-08-03 MED ORDER — LENALIDOMIDE 15 MG PO CAPS: 15.0000 mg | ORAL_CAPSULE | Freq: Every day | ORAL | Status: DC

## 2014-08-08 ENCOUNTER — Other Ambulatory Visit (HOSPITAL_BASED_OUTPATIENT_CLINIC_OR_DEPARTMENT_OTHER): Payer: 59

## 2014-08-08 ENCOUNTER — Other Ambulatory Visit: Payer: 59

## 2014-08-08 ENCOUNTER — Ambulatory Visit (HOSPITAL_BASED_OUTPATIENT_CLINIC_OR_DEPARTMENT_OTHER): Payer: 59

## 2014-08-08 ENCOUNTER — Encounter: Payer: 59 | Admitting: *Deleted

## 2014-08-08 VITALS — BP 133/72 | HR 83 | Temp 98.4°F | Resp 16

## 2014-08-08 DIAGNOSIS — C9000 Multiple myeloma not having achieved remission: Secondary | ICD-10-CM

## 2014-08-08 DIAGNOSIS — Z95828 Presence of other vascular implants and grafts: Secondary | ICD-10-CM

## 2014-08-08 DIAGNOSIS — Z5112 Encounter for antineoplastic immunotherapy: Secondary | ICD-10-CM

## 2014-08-08 LAB — CBC WITH DIFFERENTIAL/PLATELET
BASO%: 0.7 % (ref 0.0–2.0)
Basophils Absolute: 0 10*3/uL (ref 0.0–0.1)
EOS%: 2.3 % (ref 0.0–7.0)
Eosinophils Absolute: 0.1 10*3/uL (ref 0.0–0.5)
HCT: 37.9 % (ref 34.8–46.6)
HGB: 11.4 g/dL — ABNORMAL LOW (ref 11.6–15.9)
LYMPH%: 17.5 % (ref 14.0–49.7)
MCH: 23.5 pg — ABNORMAL LOW (ref 25.1–34.0)
MCHC: 30.2 g/dL — ABNORMAL LOW (ref 31.5–36.0)
MCV: 78 fL — ABNORMAL LOW (ref 79.5–101.0)
MONO#: 0.3 10*3/uL (ref 0.1–0.9)
MONO%: 8.2 % (ref 0.0–14.0)
NEUT#: 2.6 10*3/uL (ref 1.5–6.5)
NEUT%: 71.3 % (ref 38.4–76.8)
Platelets: 199 10*3/uL (ref 145–400)
RBC: 4.85 10*6/uL (ref 3.70–5.45)
RDW: 14.8 % — ABNORMAL HIGH (ref 11.2–14.5)
WBC: 3.6 10*3/uL — ABNORMAL LOW (ref 3.9–10.3)
lymph#: 0.6 10*3/uL — ABNORMAL LOW (ref 0.9–3.3)

## 2014-08-08 MED ORDER — ONDANSETRON HCL 8 MG PO TABS
ORAL_TABLET | ORAL | Status: AC
  Filled 2014-08-08: qty 1

## 2014-08-08 MED ORDER — INV-BORTEZOMIB CHEMO SQ INJECTION 3.5 MG CTSU E1A11
1.0000 mg/m2 | Freq: Once | INTRAVENOUS | Status: AC
  Administered 2014-08-08: 2 mg via SUBCUTANEOUS
  Filled 2014-08-08: qty 2

## 2014-08-08 MED ORDER — SODIUM CHLORIDE 0.9 % IJ SOLN
10.0000 mL | INTRAMUSCULAR | Status: DC | PRN
  Administered 2014-08-08: 10 mL via INTRAVENOUS
  Filled 2014-08-08: qty 10

## 2014-08-08 MED ORDER — ONDANSETRON HCL 8 MG PO TABS
8.0000 mg | ORAL_TABLET | Freq: Once | ORAL | Status: AC
  Administered 2014-08-08: 8 mg via ORAL

## 2014-08-08 NOTE — Patient Instructions (Signed)
[facility] Discharge Instructions for Patients Receiving Chemotherapy  Today you received the following chemotherapy agent: Velcade   To help prevent nausea and vomiting after your treatment, we encourage you to take your nausea medication as prescribed.    If you develop nausea and vomiting that is not controlled by your nausea medication, call the clinic.   BELOW ARE SYMPTOMS THAT SHOULD BE REPORTED IMMEDIATELY:  *FEVER GREATER THAN 100.5 F  *CHILLS WITH OR WITHOUT FEVER  NAUSEA AND VOMITING THAT IS NOT CONTROLLED WITH YOUR NAUSEA MEDICATION  *UNUSUAL SHORTNESS OF BREATH  *UNUSUAL BRUISING OR BLEEDING  TENDERNESS IN MOUTH AND THROAT WITH OR WITHOUT PRESENCE OF ULCERS  *URINARY PROBLEMS  *BOWEL PROBLEMS  UNUSUAL RASH Items with * indicate a potential emergency and should be followed up as soon as possible.  Feel free to call the clinic you have any questions or concerns. The clinic phone number is [phone].

## 2014-08-08 NOTE — Patient Instructions (Signed)

## 2014-08-08 NOTE — Progress Notes (Signed)
08/08/2014 Patient in to clinic today for day 8 treatment (Cycle 11). CBC results are within range for continued treatment. Sign for injection given to Cira Rue, RN. [name] BSN, RN, CCRP 08/08/2014 1:12 PM

## 2014-08-08 NOTE — Progress Notes (Signed)
Treatment parameters verified with research RN prior to patient arrival.

## 2014-08-10 ENCOUNTER — Ambulatory Visit (INDEPENDENT_AMBULATORY_CARE_PROVIDER_SITE_OTHER): Payer: 59

## 2014-08-10 DIAGNOSIS — R51 Headache: Secondary | ICD-10-CM

## 2014-08-10 MED ORDER — GADOPENTETATE DIMEGLUMINE 469.01 MG/ML IV SOLN: 18.0000 mL | Freq: Once | INTRAVENOUS | Status: AC | PRN

## 2014-08-15 ENCOUNTER — Ambulatory Visit: Payer: 59

## 2014-08-17 ENCOUNTER — Other Ambulatory Visit: Payer: Self-pay | Admitting: Neurology

## 2014-08-17 MED ORDER — VENLAFAXINE HCL ER 37.5 MG PO CP24: 37.5000 mg | ORAL_CAPSULE | Freq: Every day | ORAL | Status: DC

## 2014-08-18 NOTE — Telephone Encounter (Signed)
RECEIVED A FAX FROM [facility] CONCERNING A CONFIRMATION OF PRESCRIPTION SHIPMENT FOR REVLIMID ON 08/17/14.

## 2014-08-22 ENCOUNTER — Telehealth: Payer: Self-pay | Admitting: Hematology and Oncology

## 2014-08-22 ENCOUNTER — Ambulatory Visit (HOSPITAL_BASED_OUTPATIENT_CLINIC_OR_DEPARTMENT_OTHER): Payer: 59 | Admitting: Hematology and Oncology

## 2014-08-22 ENCOUNTER — Other Ambulatory Visit (HOSPITAL_BASED_OUTPATIENT_CLINIC_OR_DEPARTMENT_OTHER): Payer: 59

## 2014-08-22 ENCOUNTER — Encounter: Payer: 59 | Admitting: *Deleted

## 2014-08-22 ENCOUNTER — Ambulatory Visit (HOSPITAL_BASED_OUTPATIENT_CLINIC_OR_DEPARTMENT_OTHER): Payer: 59

## 2014-08-22 ENCOUNTER — Encounter: Payer: Self-pay | Admitting: Hematology and Oncology

## 2014-08-22 VITALS — BP 128/77 | HR 87 | Temp 98.0°F | Resp 18 | Ht 67.5 in | Wt 189.6 lb

## 2014-08-22 DIAGNOSIS — C9000 Multiple myeloma not having achieved remission: Secondary | ICD-10-CM

## 2014-08-22 DIAGNOSIS — D701 Agranulocytosis secondary to cancer chemotherapy: Secondary | ICD-10-CM

## 2014-08-22 DIAGNOSIS — H1130 Conjunctival hemorrhage, unspecified eye: Secondary | ICD-10-CM

## 2014-08-22 DIAGNOSIS — D630 Anemia in neoplastic disease: Secondary | ICD-10-CM

## 2014-08-22 DIAGNOSIS — Z5112 Encounter for antineoplastic immunotherapy: Secondary | ICD-10-CM

## 2014-08-22 DIAGNOSIS — M899 Disorder of bone, unspecified: Secondary | ICD-10-CM

## 2014-08-22 DIAGNOSIS — H1131 Conjunctival hemorrhage, right eye: Secondary | ICD-10-CM

## 2014-08-22 DIAGNOSIS — T451X5A Adverse effect of antineoplastic and immunosuppressive drugs, initial encounter: Secondary | ICD-10-CM

## 2014-08-22 DIAGNOSIS — D72819 Decreased white blood cell count, unspecified: Secondary | ICD-10-CM

## 2014-08-22 DIAGNOSIS — G43719 Chronic migraine without aura, intractable, without status migrainosus: Secondary | ICD-10-CM

## 2014-08-22 DIAGNOSIS — M949 Disorder of cartilage, unspecified: Secondary | ICD-10-CM

## 2014-08-22 DIAGNOSIS — R1084 Generalized abdominal pain: Secondary | ICD-10-CM | POA: Insufficient documentation

## 2014-08-22 DIAGNOSIS — M898X9 Other specified disorders of bone, unspecified site: Secondary | ICD-10-CM

## 2014-08-22 LAB — COMPREHENSIVE METABOLIC PANEL (CC13)
ALT: 33 U/L (ref 0–55)
AST: 20 U/L (ref 5–34)
Albumin: 3.3 g/dL — ABNORMAL LOW (ref 3.5–5.0)
Alkaline Phosphatase: 50 U/L (ref 40–150)
Anion Gap: 8 mEq/L (ref 3–11)
BUN: 8.5 mg/dL (ref 7.0–26.0)
CO2: 26 mEq/L (ref 22–29)
Calcium: 9.3 mg/dL (ref 8.4–10.4)
Chloride: 107 mEq/L (ref 98–109)
Creatinine: 0.8 mg/dL (ref 0.6–1.1)
Glucose: 194 mg/dl — ABNORMAL HIGH (ref 70–140)
Potassium: 3.4 mEq/L — ABNORMAL LOW (ref 3.5–5.1)
Sodium: 142 mEq/L (ref 136–145)
Total Bilirubin: 0.49 mg/dL (ref 0.20–1.20)
Total Protein: 6.6 g/dL (ref 6.4–8.3)

## 2014-08-22 LAB — CBC WITH DIFFERENTIAL/PLATELET
BASO%: 0.3 % (ref 0.0–2.0)
Basophils Absolute: 0 10*3/uL (ref 0.0–0.1)
EOS%: 1.6 % (ref 0.0–7.0)
Eosinophils Absolute: 0.1 10*3/uL (ref 0.0–0.5)
HCT: 36.2 % (ref 34.8–46.6)
HGB: 11.5 g/dL — ABNORMAL LOW (ref 11.6–15.9)
LYMPH%: 44.7 % (ref 14.0–49.7)
MCH: 24.3 pg — ABNORMAL LOW (ref 25.1–34.0)
MCHC: 31.8 g/dL (ref 31.5–36.0)
MCV: 76.4 fL — ABNORMAL LOW (ref 79.5–101.0)
MONO#: 0.4 10*3/uL (ref 0.1–0.9)
MONO%: 14.2 % — ABNORMAL HIGH (ref 0.0–14.0)
NEUT#: 1.2 10*3/uL — ABNORMAL LOW (ref 1.5–6.5)
NEUT%: 39.2 % (ref 38.4–76.8)
Platelets: 178 10*3/uL (ref 145–400)
RBC: 4.74 10*6/uL (ref 3.70–5.45)
RDW: 14.3 % (ref 11.2–14.5)
WBC: 3.1 10*3/uL — ABNORMAL LOW (ref 3.9–10.3)
lymph#: 1.4 10*3/uL (ref 0.9–3.3)

## 2014-08-22 LAB — LACTATE DEHYDROGENASE (CC13): LDH: 213 U/L (ref 125–245)

## 2014-08-22 MED ORDER — ONDANSETRON HCL 8 MG PO TABS
8.0000 mg | ORAL_TABLET | Freq: Once | ORAL | Status: AC
  Administered 2014-08-22: 8 mg via ORAL

## 2014-08-22 MED ORDER — ONDANSETRON HCL 8 MG PO TABS
ORAL_TABLET | ORAL | Status: AC
  Filled 2014-08-22: qty 1

## 2014-08-22 MED ORDER — INV-BORTEZOMIB CHEMO SQ INJECTION 3.5 MG CTSU E1A11
1.0000 mg/m2 | Freq: Once | INTRAVENOUS | Status: AC
  Administered 2014-08-22: 2 mg via SUBCUTANEOUS
  Filled 2014-08-22: qty 2

## 2014-08-22 NOTE — Assessment & Plan Note (Signed)
This is due to minor trauma. The patient remain on anticoagulation therapy and I recommend close monitoring for bleeding complications.

## 2014-08-22 NOTE — Assessment & Plan Note (Signed)
I will proceed with treatment without dosage adjustment. I plan on doing a bone marrow biopsy to assess stage of disease response rate. Hopefully, she can go on maintenance Revlimid in the future.

## 2014-08-22 NOTE — Assessment & Plan Note (Signed)
I am wondering whether this could be due to lactose intolerance. I recommended she discontinue all dairy producs for at least a month.

## 2014-08-22 NOTE — Assessment & Plan Note (Signed)
Recent MRI showed no evidence of brain lesions. Patient is reassured.

## 2014-08-22 NOTE — Assessment & Plan Note (Signed)
This is likely due to recent treatment. The patient denies recent history of fevers, cough, chills, diarrhea or dysuria. She is asymptomatic from the leukopenia. I will observe for now.    

## 2014-08-22 NOTE — Progress Notes (Signed)
08/22/2014 Patient in to clinic today for evaluation prior to receiving treatment cycle 12. Based on lab results review and history and physical by Dr. Alvy Bimler, patient condition is acceptable for continued treatment. Patient returned completed cycle 11 Patient Medication Calendar, confirming correct dosing as prescribed, with the exception of one missed dose of Revlimid on Day 14, due to extra dose taken at anticipated start of Cycle 11 on 07/18/2014; cycle was subsequently delayed.Pam Drown cycle 12 Patient Medication Calendar was given to patient today for completion. Patient reports mild fatigue and intermittent insomnia (ongoing) throughout this past cycle.  Sign for injection given to [place]. [name] BSN, RN, CCRP 08/22/2014 1:20 PM

## 2014-08-22 NOTE — Telephone Encounter (Signed)
, °

## 2014-08-22 NOTE — Assessment & Plan Note (Signed)
This is unlikely due to her disease. I recommend her to continue taking oxycodone or tramadol as needed. I will order a skeletal survey to be done next month for assessment.

## 2014-08-22 NOTE — Progress Notes (Signed)
OK to treat with ANC 1.2 per Dr. Alvy Bimler. Research nurse aware also.

## 2014-08-22 NOTE — Progress Notes (Signed)
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   05/05/2014 Imaging Imaging study of the neck showed no explanation that could cause right neck pain. She is noted to have incidental left upper lung nodule. Plan to repeat imaging study in 3 months.    INTERVAL HISTORY: Please see below for problem oriented charting. She tolerated chemotherapy well. She complained of postprandial abdominal cramps associated with bowel movement. She denies any real diarrhea. Denies any nausea. She also complained of back pain and bilateral knee pain. The back pain is described as a burning sensation, comes and goes. She had mild trauma to her right eye. Denies any vision changes. She had MRI recently for evaluation of chronic headaches. The patient denies any recent signs or symptoms of bleeding such as spontaneous epistaxis, hematuria or hematochezia.  REVIEW OF SYSTEMS:   Constitutional: Denies fevers, chills or abnormal weight loss Eyes: Denies blurriness of vision Ears, nose, mouth, throat, and face: Denies mucositis or sore throat Respiratory: Denies cough, dyspnea or wheezes Cardiovascular:  Denies palpitation, chest discomfort or lower extremity swelling Skin: Denies abnormal skin rashes Lymphatics: Denies new lymphadenopathy or easy bruising Neurological:Denies numbness, tingling or new weaknesses Behavioral/Psych: Mood is stable, no new changes  All other systems were reviewed with the patient and are negative.  I have reviewed the past medical history, past surgical history, social history and family history with the patient and they are unchanged from previous note.  ALLERGIES:  has No Known Allergies.  MEDICATIONS:  Current Outpatient Prescriptions  Medication Sig Dispense Refill  . acyclovir (ZOVIRAX) 400 MG tablet Take 1 tablet (400 mg total) by mouth 2 (two) times daily.  60 tablet  6  . calcium carbonate (TUMS - DOSED IN MG ELEMENTAL CALCIUM) 500 MG chewable tablet Chew 1 tablet by mouth as needed for indigestion or heartburn.      . carboxymethylcellulose (REFRESH PLUS) 0.5 % SOLN 1 drop daily as needed.      . Cholecalciferol (VITAMIN D3) 2000 UNITS TABS Take 2,000 Units by mouth daily.      Marland Kitchen dexamethasone (DECADRON) 4 MG tablet Take 2 & 1/2 tab (10 mg) on days 1,2,8,9 every 21 days for cycles 9-12.  40 tablet  0  . hydrochlorothiazide (HYDRODIURIL) 25 MG tablet Take 25 mg by mouth daily with breakfast.       . lenalidomide (REVLIMID) 15 MG capsule Take 1 capsule (15 mg total) by mouth daily. X 14 days on and 7 days off.  14 capsule  0  . levothyroxine (SYNTHROID, LEVOTHROID) 75 MCG tablet Take 75 mcg by mouth daily before breakfast.      . lidocaine-prilocaine (EMLA) cream Apply 1 application topically as needed (port).      Marland Kitchen  Multiple Vitamins-Minerals (CENTRUM SILVER PO) Take 1 tablet by mouth daily.       Marland Kitchen oxyCODONE 10 MG TABS Take 1 tablet (10 mg total) by mouth every 4 (four) hours as needed for severe pain.  60 tablet  0  . polyethylene glycol (MIRALAX / GLYCOLAX) packet Take 17 g by mouth daily.      . prochlorperazine (COMPAZINE) 10 MG tablet Take 1  tablet (10 mg total) by mouth every 6 (six) hours as needed (Nausea or vomiting).  30 tablet  1  . rivaroxaban (XARELTO) 20 MG TABS tablet Take 1 tablet (20 mg total) by mouth daily with supper.  90 tablet  3  . traMADol (ULTRAM) 50 MG tablet Take 1 tablet (50 mg total) by mouth every 6 (six) hours as needed for moderate pain or severe pain.  60 tablet  0  . venlafaxine XR (EFFEXOR XR) 37.5 MG 24 hr capsule Take 1 capsule (37.5 mg total) by mouth daily with breakfast.  90 capsule  3  . verapamil (VERELAN PM) 120 MG 24 hr capsule Take 1 capsule (120 mg  total) by mouth daily.  90 capsule  0   No current facility-administered medications for this visit.    PHYSICAL EXAMINATION: ECOG PERFORMANCE STATUS: 1 - Symptomatic but completely ambulatory  Filed Vitals:   08/22/14 0929  BP: 128/77  Pulse: 87  Temp: 98 F (36.7 C)  Resp: 18   Filed Weights   08/22/14 0929  Weight: 189 lb 9.6 oz (86.002 kg)    GENERAL:alert, no distress and comfortable SKIN: skin color, texture, turgor are normal, no rashes or significant lesions EYES: No traumatic sclera hematoma on the right eye OROPHARYNX:no exudate, no erythema and lips, buccal mucosa, and tongue normal  NECK: supple, thyroid normal size, non-tender, without nodularity LYMPH:  no palpable lymphadenopathy in the cervical, axillary or inguinal LUNGS: clear to auscultation and percussion with normal breathing effort HEART: regular rate & rhythm and no murmurs and no lower extremity edema ABDOMEN:abdomen soft, non-tender and normal bowel sounds Musculoskeletal:no cyanosis of digits and no clubbing . She had no tenderness on percussion of the spine and no evidence of knee joint swelling NEURO: alert & oriented x 3 with fluent speech, no focal motor/sensory deficits  LABORATORY DATA:  I have reviewed the data as listed    Component Value Date/Time   NA 142 08/22/2014 0919   K 3.4* 08/22/2014 0919   CO2 26 08/22/2014 0919   GLUCOSE 194*  08/22/2014 0919   BUN 8.5 08/22/2014 0919   CREATININE 0.8 08/22/2014 0919   CALCIUM 9.3 08/22/2014 0919   PROT 6.6 08/22/2014 0919   ALBUMIN 3.3* 08/22/2014 0919   AST 20 08/22/2014 0919   ALT 33 08/22/2014 0919   ALKPHOS 50 08/22/2014 0919   BILITOT 0.49 08/22/2014 0919    No results found for this basename: SPEP, UPEP,  kappa and lambda light chains    Lab Results  Component Value Date   WBC 3.1* 08/22/2014   NEUTROABS 1.2* 08/22/2014   HGB 11.5* 08/22/2014   HCT 36.2 08/22/2014   MCV 76.4* 08/22/2014   PLT 178 08/22/2014      Chemistry      Component Value Date/Time   NA 142 08/22/2014 0919   K 3.4* 08/22/2014 0919   CO2 26 08/22/2014 0919   BUN 8.5 08/22/2014 0919   CREATININE 0.8 08/22/2014 0919      Component Value Date/Time   CALCIUM 9.3 08/22/2014 0919  ALKPHOS 50 08/22/2014 0919   AST 20 08/22/2014 0919   ALT 33 08/22/2014 0919   BILITOT 0.49 08/22/2014 0919      ASSESSMENT & PLAN:  Multiple myeloma, without mention of having achieved remission I will proceed with treatment without dosage adjustment. I plan on doing a bone marrow biopsy to assess stage of disease response rate. Hopefully, she can go on maintenance Revlimid in the future.  Chronic migraine without aura, with intractable migraine, so stated, without mention of status migrainosus Recent MRI showed no evidence of brain lesions. Patient is reassured.  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 The patient denies recent history of bleeding such as epistaxis, hematuria or hematochezia. She is asymptomatic from the anemia. I will observe for now.        Leukopenia due to antineoplastic chemotherapy This is likely due to recent treatment. The patient denies recent history of fevers, cough, chills, diarrhea or dysuria. She is asymptomatic from the leukopenia. I will observe for now.         Bone pain This is unlikely due to her disease. I recommend her to continue taking  oxycodone or tramadol as needed. I will order a skeletal survey to be done next month for assessment.  Generalized postprandial abdominal pain I am wondering whether this could be due to lactose intolerance. I recommended she discontinue all dairy producs for at least a month.  Scleral hemorrhage of right eye This is due to minor trauma. The patient remain on anticoagulation therapy and I recommend close monitoring for bleeding complications.   Orders Placed This Encounter  Procedures  . DG Bone Survey Met    Standing Status: Future     Number of Occurrences:      Standing Expiration Date: 10/22/2015    Order Specific Question:  Reason for Exam (SYMPTOM  OR DIAGNOSIS REQUIRED)    Answer:  staging myeloma    Order Specific Question:  Is the patient pregnant?    Answer:  No    Order Specific Question:  Preferred imaging location?    Answer:  [facility]   All questions were answered. The patient knows to call the clinic with any problems, questions or concerns. No barriers to learning was detected. I spent 40 minutes counseling the patient face to face. The total time spent in the appointment was 55 minutes and more than 50% was on counseling and review of test results     [facility], [place], MD 08/22/2014 10:25 AM

## 2014-08-22 NOTE — Assessment & Plan Note (Signed)
This is likely due to recent treatment. The patient denies recent history of bleeding such as epistaxis, hematuria or hematochezia. She is asymptomatic from the anemia. I will observe for now.   

## 2014-08-22 NOTE — Patient Instructions (Signed)
[facility] Discharge Instructions for Patients Receiving Chemotherapy  Today you received the following chemotherapy agents: Velcade.  To help prevent nausea and vomiting after your treatment, we encourage you to take your nausea medication: compazine 10 mg every 6 hours as needed.   If you develop nausea and vomiting that is not controlled by your nausea medication, call the clinic.   BELOW ARE SYMPTOMS THAT SHOULD BE REPORTED IMMEDIATELY:  *FEVER GREATER THAN 100.5 F  *CHILLS WITH OR WITHOUT FEVER  NAUSEA AND VOMITING THAT IS NOT CONTROLLED WITH YOUR NAUSEA MEDICATION  *UNUSUAL SHORTNESS OF BREATH  *UNUSUAL BRUISING OR BLEEDING  TENDERNESS IN MOUTH AND THROAT WITH OR WITHOUT PRESENCE OF ULCERS  *URINARY PROBLEMS  *BOWEL PROBLEMS  UNUSUAL RASH Items with * indicate a potential emergency and should be followed up as soon as possible.  Feel free to call the clinic you have any questions or concerns. The clinic phone number is [phone].    

## 2014-08-23 ENCOUNTER — Other Ambulatory Visit: Payer: Self-pay | Admitting: Hematology and Oncology

## 2014-08-23 DIAGNOSIS — R5383 Other fatigue: Secondary | ICD-10-CM | POA: Insufficient documentation

## 2014-08-24 LAB — SPEP & IFE WITH QIG
Albumin ELP: 60.4 % (ref 55.8–66.1)
Alpha-1-Globulin: 4.2 % (ref 2.9–4.9)
Alpha-2-Globulin: 9.9 % (ref 7.1–11.8)
Beta 2: 5.1 % (ref 3.2–6.5)
Beta Globulin: 6.4 % (ref 4.7–7.2)
Gamma Globulin: 14 % (ref 11.1–18.8)
IgA: 85 mg/dL (ref 69–380)
IgG (Immunoglobin G), Serum: 973 mg/dL (ref 690–1700)
IgM, Serum: 51 mg/dL — ABNORMAL LOW (ref 52–322)
M-Spike, %: 0.32 g/dL
Total Protein, Serum Electrophoresis: 6.2 g/dL (ref 6.0–8.3)

## 2014-08-24 LAB — KAPPA/LAMBDA LIGHT CHAINS
Kappa free light chain: 1.01 mg/dL (ref 0.33–1.94)
Kappa:Lambda Ratio: 0.84 (ref 0.26–1.65)
Lambda Free Lght Chn: 1.2 mg/dL (ref 0.57–2.63)

## 2014-08-29 ENCOUNTER — Other Ambulatory Visit: Payer: Self-pay | Admitting: Hematology and Oncology

## 2014-08-29 ENCOUNTER — Other Ambulatory Visit (HOSPITAL_BASED_OUTPATIENT_CLINIC_OR_DEPARTMENT_OTHER): Payer: 59

## 2014-08-29 ENCOUNTER — Ambulatory Visit (HOSPITAL_BASED_OUTPATIENT_CLINIC_OR_DEPARTMENT_OTHER): Payer: 59

## 2014-08-29 ENCOUNTER — Encounter: Payer: Self-pay | Admitting: *Deleted

## 2014-08-29 VITALS — BP 136/76 | HR 93 | Temp 97.9°F

## 2014-08-29 DIAGNOSIS — C9000 Multiple myeloma not having achieved remission: Secondary | ICD-10-CM

## 2014-08-29 DIAGNOSIS — R5383 Other fatigue: Secondary | ICD-10-CM

## 2014-08-29 DIAGNOSIS — Z5112 Encounter for antineoplastic immunotherapy: Secondary | ICD-10-CM

## 2014-08-29 LAB — CBC WITH DIFFERENTIAL/PLATELET
BASO%: 0 % (ref 0.0–2.0)
Basophils Absolute: 0 10*3/uL (ref 0.0–0.1)
EOS%: 4.6 % (ref 0.0–7.0)
Eosinophils Absolute: 0.1 10*3/uL (ref 0.0–0.5)
HCT: 37.3 % (ref 34.8–46.6)
HGB: 11.8 g/dL (ref 11.6–15.9)
LYMPH%: 39.4 % (ref 14.0–49.7)
MCH: 24.1 pg — ABNORMAL LOW (ref 25.1–34.0)
MCHC: 31.6 g/dL (ref 31.5–36.0)
MCV: 76.1 fL — ABNORMAL LOW (ref 79.5–101.0)
MONO#: 0.3 10*3/uL (ref 0.1–0.9)
MONO%: 11 % (ref 0.0–14.0)
NEUT#: 1.3 10*3/uL — ABNORMAL LOW (ref 1.5–6.5)
NEUT%: 45 % (ref 38.4–76.8)
Platelets: 219 10*3/uL (ref 145–400)
RBC: 4.9 10*6/uL (ref 3.70–5.45)
RDW: 14.6 % — ABNORMAL HIGH (ref 11.2–14.5)
WBC: 2.8 10*3/uL — ABNORMAL LOW (ref 3.9–10.3)
lymph#: 1.1 10*3/uL (ref 0.9–3.3)

## 2014-08-29 LAB — T4, FREE: Free T4: 1.23 ng/dL (ref 0.80–1.80)

## 2014-08-29 LAB — TSH CHCC: TSH: 0.157 m(IU)/L — ABNORMAL LOW (ref 0.308–3.960)

## 2014-08-29 MED ORDER — ONDANSETRON HCL 8 MG PO TABS
ORAL_TABLET | ORAL | Status: AC
  Filled 2014-08-29: qty 1

## 2014-08-29 MED ORDER — INV-BORTEZOMIB CHEMO SQ INJECTION 3.5 MG CTSU E1A11
1.0000 mg/m2 | Freq: Once | INTRAVENOUS | Status: AC
  Administered 2014-08-29: 2 mg via SUBCUTANEOUS
  Filled 2014-08-29: qty 2

## 2014-08-29 MED ORDER — ONDANSETRON HCL 8 MG PO TABS
8.0000 mg | ORAL_TABLET | Freq: Once | ORAL | Status: AC
  Administered 2014-08-29: 8 mg via ORAL

## 2014-08-29 NOTE — Progress Notes (Signed)
08/29/2014 Patient in to clinic today for Day 8 treatment. CBC results meet parameters for continued treatment. Patient to be given a 24-hour urine jug and will be notified if collection is needed for response assessment. Patient is aware that thyroid function is being evaluated further today. Sign for injection given to [facility]. Confirmed upcoming appointments for skeletal survey and bone marrow procedure with patient. [name] BSN, RN, [place] 08/29/2014 11:15am  08/29/2014 Abnormal TSH result reviewed by Dr. Alvy Bimler. Protocol section 5.4.5 regarding thyroid abnormalities reviewed with Dr. Alvy Bimler. Awaiting results of free T4 to determine significance of low TSH result, and to determine whether or not to hold lenalidomide for the remainder of the cycle. [name] BSN, RN, [place] 08/29/2014 2:07 PM

## 2014-08-29 NOTE — Progress Notes (Signed)
Ok to treat per research protocol/Cindy Brigitte Pulse, RN

## 2014-08-29 NOTE — Patient Instructions (Signed)
[facility] Discharge Instructions for Patients Receiving Chemotherapy  Today you received the following chemotherapy agents velcade   To help prevent nausea and vomiting after your treatment, we encourage you to take your nausea medication as directed  If you develop nausea and vomiting that is not controlled by your nausea medication, call the clinic.   BELOW ARE SYMPTOMS THAT SHOULD BE REPORTED IMMEDIATELY:  *FEVER GREATER THAN 100.5 F  *CHILLS WITH OR WITHOUT FEVER  NAUSEA AND VOMITING THAT IS NOT CONTROLLED WITH YOUR NAUSEA MEDICATION  *UNUSUAL SHORTNESS OF BREATH  *UNUSUAL BRUISING OR BLEEDING  TENDERNESS IN MOUTH AND THROAT WITH OR WITHOUT PRESENCE OF ULCERS  *URINARY PROBLEMS  *BOWEL PROBLEMS  UNUSUAL RASH Items with * indicate a potential emergency and should be followed up as soon as possible.  Feel free to call the clinic you have any questions or concerns. The clinic phone number is [phone].  

## 2014-08-30 ENCOUNTER — Encounter: Payer: Self-pay | Admitting: Hematology and Oncology

## 2014-08-30 ENCOUNTER — Other Ambulatory Visit: Payer: Self-pay | Admitting: Hematology and Oncology

## 2014-08-30 ENCOUNTER — Encounter: Payer: Self-pay | Admitting: *Deleted

## 2014-08-30 ENCOUNTER — Telehealth: Payer: Self-pay | Admitting: *Deleted

## 2014-08-30 DIAGNOSIS — R946 Abnormal results of thyroid function studies: Secondary | ICD-10-CM

## 2014-08-30 HISTORY — DX: Abnormal results of thyroid function studies: R94.6

## 2014-08-30 NOTE — Telephone Encounter (Signed)
Raoul Pitch in Research to contact pt to notify to hold Revlimid per Dr. Alvy Bimler and inform of Referral to Endocrinology d/t abnormal thyroid test.

## 2014-08-30 NOTE — Telephone Encounter (Signed)
Message copied by Cathlean Cower on Wed Aug 30, 2014  8:36 AM ------      Message from: [facility], [place]: Wed Aug 30, 2014  8:24 AM      Regarding: abnormal thyroid function test       PLease let her know I will refer her to endocrinology for eval      ----- Message -----         From: [facility]         Sent: 08/29/2014  10:02 AM           To: Heath Lark, MD                   ------

## 2014-08-30 NOTE — Progress Notes (Unsigned)
08/30/14 @ 3:00 pm, [facility] [id number], Abnormal thyroid function:  Email received from Dr. Alvy Bimler to have patient hold lenalidomide until further notice.  She will refer the patient to an endocrinologist.  Called [facility] and informed her of the referral and to expect a call to schedule an appointment.  Asked her to hold her lenalidomide.  She has taken her dose today, but agreed not to take any more doses until instructed to do so by Dr. Alvy Bimler.

## 2014-08-31 ENCOUNTER — Telehealth: Payer: Self-pay | Admitting: Hematology and Oncology

## 2014-08-31 DIAGNOSIS — E119 Type 2 diabetes mellitus without complications: Secondary | ICD-10-CM

## 2014-08-31 HISTORY — DX: Type 2 diabetes mellitus without complications: E11.9

## 2014-08-31 NOTE — Telephone Encounter (Signed)
lvm for [facility]. Advised Tiff M and Blima Singer. To fax all records to [phone] office notes/insurance

## 2014-08-31 NOTE — Telephone Encounter (Signed)
Faxed pt medical records to [facility] [phone] °

## 2014-09-05 ENCOUNTER — Encounter (HOSPITAL_COMMUNITY): Payer: Self-pay | Admitting: Pharmacy Technician

## 2014-09-05 ENCOUNTER — Other Ambulatory Visit: Payer: Self-pay | Admitting: *Deleted

## 2014-09-05 NOTE — Telephone Encounter (Signed)
THIS REFILL REQUEST FOR REVLIMID WAS PLACED ON DR.GORSUCH'S DESK. 

## 2014-09-06 ENCOUNTER — Ambulatory Visit (HOSPITAL_COMMUNITY)
Admission: RE | Admit: 2014-09-06 | Discharge: 2014-09-06 | Disposition: A | Discharge status: Home / Self Care | Payer: 59 | Source: Ambulatory Visit | Attending: Hematology and Oncology | Admitting: Hematology and Oncology

## 2014-09-06 ENCOUNTER — Other Ambulatory Visit: Payer: Self-pay | Admitting: Hematology and Oncology

## 2014-09-06 DIAGNOSIS — C9000 Multiple myeloma not having achieved remission: Secondary | ICD-10-CM

## 2014-09-06 DIAGNOSIS — M545 Low back pain: Secondary | ICD-10-CM | POA: Insufficient documentation

## 2014-09-06 DIAGNOSIS — M25511 Pain in right shoulder: Secondary | ICD-10-CM | POA: Diagnosis not present

## 2014-09-07 ENCOUNTER — Telehealth: Payer: Self-pay | Admitting: *Deleted

## 2014-09-07 ENCOUNTER — Other Ambulatory Visit: Payer: Self-pay | Admitting: Hematology and Oncology

## 2014-09-07 ENCOUNTER — Ambulatory Visit (HOSPITAL_COMMUNITY)
Admission: RE | Admit: 2014-09-07 | Discharge: 2014-09-07 | Disposition: A | Discharge status: Home / Self Care | Payer: 59 | Source: Ambulatory Visit | Attending: Hematology and Oncology | Admitting: Hematology and Oncology

## 2014-09-07 ENCOUNTER — Other Ambulatory Visit: Payer: Self-pay | Admitting: *Deleted

## 2014-09-07 VITALS — BP 124/69 | HR 73 | Temp 98.0°F | Resp 16

## 2014-09-07 DIAGNOSIS — C9000 Multiple myeloma not having achieved remission: Secondary | ICD-10-CM

## 2014-09-07 MED ORDER — SODIUM CHLORIDE 0.9 % IV SOLN: INTRAVENOUS | Status: DC

## 2014-09-07 MED ORDER — FENTANYL CITRATE 0.05 MG/ML IJ SOLN: 100.0000 ug | Freq: Once | INTRAMUSCULAR | Status: DC

## 2014-09-07 MED ORDER — MIDAZOLAM HCL 10 MG/2ML IJ SOLN: 10.0000 mg | Freq: Once | INTRAMUSCULAR | Status: DC

## 2014-09-07 NOTE — Progress Notes (Signed)
Pt arrived today with husband for her Bone Marrow Biopsy with sedation. Pt informed me that she had eaten a buritto and banana at 0600 today prior to arrival. Pt states she was unaware she was to be NPO This nurse placed a call to Dr Alvy Bimler to inform her of this. Procedure is to be canceled and rescheduled another day and patient is to be NPO prior to procedure. Pt verbalized understanding.

## 2014-09-07 NOTE — Telephone Encounter (Signed)
Pt asking about referral to "thyroid doctor."  States she has not heard about appt yet.  [place] Pt Coordinator and left VM ph#[phone].   Asked for return call update about referral.

## 2014-09-07 NOTE — Telephone Encounter (Signed)
Informed pt I left VM for new pt coordinator at [facility].  I have not heard anything back yet.  We have sent her records to them for review.  Gave pt their phone number to call for appt..  Informed pt of BMBx scheduled for 10/15.  Reminded pt nothing to eat or drink after midnight.  She verbalized understanding.

## 2014-09-07 NOTE — Telephone Encounter (Signed)
REVLIMID IS ON HOLD UNTIL BONE MARROW BIOPSY IS DONE AND PT. IS SEEN BY Craig. NOTIFIED BIOLOGICS.

## 2014-09-08 ENCOUNTER — Encounter: Payer: Self-pay | Admitting: Hematology and Oncology

## 2014-09-08 ENCOUNTER — Telehealth: Payer: Self-pay | Admitting: *Deleted

## 2014-09-08 NOTE — Telephone Encounter (Signed)
Message copied by Cathlean Cower on Fri Sep 08, 2014  3:07 PM ------      Message from: [facility], [place]      Created: Thu Sep 07, 2014  7:02 PM      Regarding: RE: Lab/MD appt       No, we are keeping it      ----- Message -----         From: Cathlean Cower, RN         Sent: 09/07/2014   4:35 PM           To: Heath Lark, MD      Subject: Lab/MD appt                                              Informed pt of BMBx on 10/15.  She is scheduled for lab/MD on 10/16. Does this need to be rescheduled?         ------

## 2014-09-08 NOTE — Telephone Encounter (Signed)
Informed pt Dr. Alvy Bimler wants her to keep appt for Lab/MD on 10/16.   Pt verbalized understanding.  States she called Endocrinologist Dr. Elyse Hsu and would like [place] to send her records to his office for referral.  Faxed over office note, labs and demographic to Dr. Elyse Hsu at fax [phone].

## 2014-09-11 ENCOUNTER — Encounter (HOSPITAL_COMMUNITY): Payer: Self-pay | Admitting: Pharmacy Technician

## 2014-09-12 ENCOUNTER — Other Ambulatory Visit: Payer: Self-pay | Admitting: Internal Medicine

## 2014-09-12 ENCOUNTER — Ambulatory Visit
Admission: RE | Admit: 2014-09-12 | Discharge: 2014-09-12 | Disposition: A | Discharge status: Home / Self Care | Payer: 59 | Source: Ambulatory Visit | Attending: Internal Medicine | Admitting: Internal Medicine

## 2014-09-12 DIAGNOSIS — Z09 Encounter for follow-up examination after completed treatment for conditions other than malignant neoplasm: Secondary | ICD-10-CM

## 2014-09-12 DIAGNOSIS — E041 Nontoxic single thyroid nodule: Secondary | ICD-10-CM

## 2014-09-13 ENCOUNTER — Telehealth: Payer: Self-pay | Admitting: *Deleted

## 2014-09-13 ENCOUNTER — Other Ambulatory Visit: Payer: Self-pay

## 2014-09-13 DIAGNOSIS — Z1239 Encounter for other screening for malignant neoplasm of breast: Secondary | ICD-10-CM

## 2014-09-13 NOTE — Telephone Encounter (Signed)
Reminded pt of BMBx tomorrow at 7 am.  Reminded nothing to eat or drink after midnight tonight.  She verbalized understanding.

## 2014-09-14 ENCOUNTER — Encounter (HOSPITAL_COMMUNITY): Payer: Self-pay

## 2014-09-14 ENCOUNTER — Ambulatory Visit (HOSPITAL_COMMUNITY)
Admission: RE | Admit: 2014-09-14 | Discharge: 2014-09-14 | Disposition: A | Discharge status: Home / Self Care | Payer: 59 | Source: Ambulatory Visit | Attending: Hematology and Oncology | Admitting: Hematology and Oncology

## 2014-09-14 ENCOUNTER — Other Ambulatory Visit (HOSPITAL_COMMUNITY): Payer: Self-pay | Admitting: Obstetrics & Gynecology

## 2014-09-14 VITALS — BP 112/66 | HR 53 | Temp 97.8°F | Resp 16 | Ht 67.0 in | Wt 185.0 lb

## 2014-09-14 DIAGNOSIS — C9000 Multiple myeloma not having achieved remission: Secondary | ICD-10-CM | POA: Diagnosis present

## 2014-09-14 DIAGNOSIS — D509 Iron deficiency anemia, unspecified: Secondary | ICD-10-CM | POA: Diagnosis not present

## 2014-09-14 HISTORY — DX: Type 2 diabetes mellitus without complications: E11.9

## 2014-09-14 LAB — CBC WITH DIFFERENTIAL/PLATELET
Basophils Absolute: 0 10*3/uL (ref 0.0–0.1)
Basophils Relative: 0 % (ref 0–1)
Eosinophils Absolute: 0.1 10*3/uL (ref 0.0–0.7)
Eosinophils Relative: 2 % (ref 0–5)
HCT: 36.5 % (ref 36.0–46.0)
Hemoglobin: 11.7 g/dL — ABNORMAL LOW (ref 12.0–15.0)
Lymphocytes Relative: 43 % (ref 12–46)
Lymphs Abs: 1.2 10*3/uL (ref 0.7–4.0)
MCH: 23.8 pg — ABNORMAL LOW (ref 26.0–34.0)
MCHC: 32.1 g/dL (ref 30.0–36.0)
MCV: 74.3 fL — ABNORMAL LOW (ref 78.0–100.0)
Monocytes Absolute: 0.5 10*3/uL (ref 0.1–1.0)
Monocytes Relative: 19 % — ABNORMAL HIGH (ref 3–12)
Neutro Abs: 1 10*3/uL — ABNORMAL LOW (ref 1.7–7.7)
Neutrophils Relative %: 36 % — ABNORMAL LOW (ref 43–77)
Platelets: 188 10*3/uL (ref 150–400)
RBC: 4.91 MIL/uL (ref 3.87–5.11)
RDW: 14.5 % (ref 11.5–15.5)
WBC: 2.8 10*3/uL — ABNORMAL LOW (ref 4.0–10.5)

## 2014-09-14 LAB — BONE MARROW EXAM

## 2014-09-14 MED ORDER — FENTANYL CITRATE 0.05 MG/ML IJ SOLN
INTRAMUSCULAR | Status: AC | PRN
  Administered 2014-09-14 (×3): 25 ug via INTRAVENOUS

## 2014-09-14 MED ORDER — SODIUM CHLORIDE 0.9 % IV SOLN
Freq: Once | INTRAVENOUS | Status: AC
  Administered 2014-09-14: 07:00:00 via INTRAVENOUS

## 2014-09-14 MED ORDER — FENTANYL CITRATE 0.05 MG/ML IJ SOLN
100.0000 ug | Freq: Once | INTRAMUSCULAR | Status: DC
  Filled 2014-09-14: qty 2

## 2014-09-14 MED ORDER — MIDAZOLAM HCL 10 MG/2ML IJ SOLN
10.0000 mg | Freq: Once | INTRAMUSCULAR | Status: DC
  Filled 2014-09-14: qty 2

## 2014-09-14 MED ORDER — MIDAZOLAM HCL 5 MG/5ML IJ SOLN
INTRAMUSCULAR | Status: AC | PRN
  Administered 2014-09-14 (×4): 1 mg via INTRAVENOUS
  Administered 2014-09-14: 2 mg via INTRAVENOUS
  Administered 2014-09-14: 1 mg via INTRAVENOUS

## 2014-09-14 NOTE — Sedation Documentation (Signed)
Bx site remains C, D ,I

## 2014-09-14 NOTE — Sedation Documentation (Signed)
MD at bedside. 

## 2014-09-14 NOTE — Sedation Documentation (Signed)
Biopsy site C,D,I

## 2014-09-14 NOTE — Discharge Instructions (Signed)

## 2014-09-14 NOTE — Sedation Documentation (Signed)
Procedure completed  Patient tolerated well

## 2014-09-14 NOTE — Procedures (Signed)
Brief examination was performed. ENT: adequate airway clearance Heart: regular rate and rhythm.No Murmurs Lungs: clear to auscultation, no wheezes, normal respiratory effort  American Society of Anesthesiologists ASA scale 1  Mallampati Score of 3  Bone Marrow Biopsy and Aspiration Procedure Note   Informed consent was obtained and potential risks including bleeding, infection and pain were reviewed with the patient. I verified that the patient has been fasting since midnight.  The patient's name, date of birth, identification, consent and allergies were verified prior to the start of procedure and time out was performed.  A total of 7 mg of IV Versed and 75 mcg of IV fentanyl were given.  The right posterior iliac crest was chosen as the site of biopsy.  The skin was prepped with Betadine solution.   8 cc of 1% lidocaine was used to provide local anaesthesia.   11 cc of bone marrow aspirate was obtained followed by 1 inch biopsy.   The procedure was tolerated well and there were no complications.  The patient was stable at the end of the procedure.  Specimens sent for flow cytometry, cytogenetics and additional studies.

## 2014-09-15 ENCOUNTER — Encounter: Payer: 59 | Admitting: *Deleted

## 2014-09-15 ENCOUNTER — Encounter: Payer: Self-pay | Admitting: Hematology and Oncology

## 2014-09-15 ENCOUNTER — Other Ambulatory Visit (HOSPITAL_BASED_OUTPATIENT_CLINIC_OR_DEPARTMENT_OTHER): Payer: 59

## 2014-09-15 ENCOUNTER — Ambulatory Visit (HOSPITAL_BASED_OUTPATIENT_CLINIC_OR_DEPARTMENT_OTHER): Payer: 59 | Admitting: Hematology and Oncology

## 2014-09-15 VITALS — BP 131/67 | HR 84 | Temp 97.9°F | Resp 18 | Ht 67.0 in | Wt 186.6 lb

## 2014-09-15 DIAGNOSIS — C9000 Multiple myeloma not having achieved remission: Secondary | ICD-10-CM

## 2014-09-15 DIAGNOSIS — I82409 Acute embolism and thrombosis of unspecified deep veins of unspecified lower extremity: Secondary | ICD-10-CM

## 2014-09-15 DIAGNOSIS — R946 Abnormal results of thyroid function studies: Secondary | ICD-10-CM

## 2014-09-15 DIAGNOSIS — D701 Agranulocytosis secondary to cancer chemotherapy: Secondary | ICD-10-CM

## 2014-09-15 DIAGNOSIS — E876 Hypokalemia: Secondary | ICD-10-CM

## 2014-09-15 DIAGNOSIS — T451X5A Adverse effect of antineoplastic and immunosuppressive drugs, initial encounter: Secondary | ICD-10-CM

## 2014-09-15 LAB — COMPREHENSIVE METABOLIC PANEL (CC13)
ALT: 38 U/L (ref 0–55)
AST: 22 U/L (ref 5–34)
Albumin: 3.4 g/dL — ABNORMAL LOW (ref 3.5–5.0)
Alkaline Phosphatase: 53 U/L (ref 40–150)
Anion Gap: 10 mEq/L (ref 3–11)
BUN: 6.8 mg/dL — ABNORMAL LOW (ref 7.0–26.0)
CO2: 25 mEq/L (ref 22–29)
Calcium: 9.6 mg/dL (ref 8.4–10.4)
Chloride: 107 mEq/L (ref 98–109)
Creatinine: 0.9 mg/dL (ref 0.6–1.1)
Glucose: 136 mg/dl (ref 70–140)
Potassium: 3.2 mEq/L — ABNORMAL LOW (ref 3.5–5.1)
Sodium: 141 mEq/L (ref 136–145)
Total Bilirubin: 0.5 mg/dL (ref 0.20–1.20)
Total Protein: 6.5 g/dL (ref 6.4–8.3)

## 2014-09-15 LAB — CBC WITH DIFFERENTIAL/PLATELET
BASO%: 1 % (ref 0.0–2.0)
Basophils Absolute: 0 10*3/uL (ref 0.0–0.1)
EOS%: 1.9 % (ref 0.0–7.0)
Eosinophils Absolute: 0.1 10*3/uL (ref 0.0–0.5)
HCT: 37.6 % (ref 34.8–46.6)
HGB: 11.5 g/dL — ABNORMAL LOW (ref 11.6–15.9)
LYMPH%: 34 % (ref 14.0–49.7)
MCH: 23.5 pg — ABNORMAL LOW (ref 25.1–34.0)
MCHC: 30.5 g/dL — ABNORMAL LOW (ref 31.5–36.0)
MCV: 76.9 fL — ABNORMAL LOW (ref 79.5–101.0)
MONO#: 0.4 10*3/uL (ref 0.1–0.9)
MONO%: 13.6 % (ref 0.0–14.0)
NEUT#: 1.5 10*3/uL (ref 1.5–6.5)
NEUT%: 49.5 % (ref 38.4–76.8)
Platelets: 196 10*3/uL (ref 145–400)
RBC: 4.89 10*6/uL (ref 3.70–5.45)
RDW: 15.1 % — ABNORMAL HIGH (ref 11.2–14.5)
WBC: 3 10*3/uL — ABNORMAL LOW (ref 3.9–10.3)
lymph#: 1 10*3/uL (ref 0.9–3.3)

## 2014-09-15 LAB — LACTATE DEHYDROGENASE (CC13): LDH: 225 U/L (ref 125–245)

## 2014-09-15 LAB — TSH CHCC: TSH: 0.052 m[IU]/L — ABNORMAL LOW (ref 0.308–3.960)

## 2014-09-15 NOTE — Progress Notes (Signed)
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   05/05/2014 Imaging Imaging study of the neck showed no explanation that could cause right neck pain. She is noted to have incidental left upper lung nodule. Plan to repeat imaging study in 3 months.    INTERVAL HISTORY: Please see below for problem oriented charting. She is doing well. She has intermittent and mild leg pain. She has no complication from recent bone marrow biopsy. The patient denies any recent signs or symptoms of bleeding such as spontaneous epistaxis, hematuria or hematochezia.   REVIEW OF SYSTEMS:   Constitutional: Denies fevers, chills or abnormal weight loss Eyes: Denies blurriness of vision Ears, nose, mouth, throat, and face: Denies mucositis or sore throat Respiratory: Denies cough, dyspnea or wheezes Cardiovascular: Denies palpitation, chest discomfort or lower extremity swelling Gastrointestinal:  Denies nausea, heartburn or change in bowel habits Skin: Denies abnormal skin rashes Lymphatics: Denies new lymphadenopathy or easy bruising Neurological:Denies numbness, tingling or new weaknesses Behavioral/Psych: Mood is stable, no new  changes  All other systems were reviewed with the patient and are negative.  I have reviewed the past medical history, past surgical history, social history and family history with the patient and they are unchanged from previous note.  ALLERGIES:  has No Known Allergies.  MEDICATIONS:  Current Outpatient Prescriptions  Medication Sig Dispense Refill  . acyclovir (ZOVIRAX) 400 MG tablet Take 400 mg by mouth 2 (two) times daily. Per Dr. Alvy Bimler, continue for one year post-Velcade.      . calcium carbonate (TUMS - DOSED IN MG ELEMENTAL CALCIUM) 500 MG chewable tablet Chew 1 tablet by mouth as needed for indigestion or heartburn.      . carboxymethylcellulose (REFRESH PLUS) 0.5 % SOLN Place 1 drop into both eyes daily as needed (for dry eyes).       . Cholecalciferol (VITAMIN D3) 2000 UNITS TABS Take 2,000 Units by mouth daily.      . hydrochlorothiazide (HYDRODIURIL) 25 MG tablet Take 25 mg by mouth daily with breakfast.       . levothyroxine (SYNTHROID, LEVOTHROID) 75 MCG tablet Take 75 mcg by mouth daily before breakfast.      . lidocaine-prilocaine (EMLA) cream Apply 1 application topically as needed (port).      . metFORMIN (GLUCOPHAGE) 500 MG tablet Take 500 mg by mouth 2 (two) times daily with a meal.      . Multiple Vitamins-Minerals (CENTRUM SILVER PO) Take 1 tablet by mouth daily.       . Oxycodone HCl 10 MG TABS Take 5-10 mg by mouth every 4 (four) hours as needed (for pain).      . polyethylene glycol (MIRALAX / GLYCOLAX) packet Take 17 g by mouth daily.      Marland Kitchen  rivaroxaban (XARELTO) 20 MG TABS tablet Take 20 mg by mouth daily with supper.      . traMADol (ULTRAM) 50 MG tablet Take 50 mg by mouth every 6 (six) hours as needed for moderate pain.      Marland Kitchen venlafaxine XR (EFFEXOR-XR) 37.5 MG 24 hr capsule Take 37.5 mg by mouth daily with breakfast.      . verapamil (CALAN-SR) 120 MG CR tablet Take 120 mg by mouth every morning.       No current facility-administered medications for this  visit.    PHYSICAL EXAMINATION: ECOG PERFORMANCE STATUS: 0 - Asymptomatic  Filed Vitals:   09/15/14 0915  BP: 131/67  Pulse: 84  Temp: 97.9 F (36.6 C)  Resp: 18   Filed Weights   09/15/14 0915  Weight: 186 lb 9.6 oz (84.641 kg)    GENERAL:alert, no distress and comfortable. She appears cushingoid SKIN: skin color, texture, turgor are normal, no rashes or significant lesions EYES: normal, Conjunctiva are pink and non-injected, sclera clear OROPHARYNX:no exudate, no erythema and lips, buccal mucosa, and tongue normal  NECK: supple, thyroid normal size, non-tender, without nodularity LYMPH:  no palpable lymphadenopathy in the cervical, axillary or inguinal LUNGS: clear to auscultation and percussion with normal breathing effort HEART: regular rate & rhythm and no murmurs and no lower extremity edema ABDOMEN:abdomen soft, non-tender and normal bowel sounds Musculoskeletal:no cyanosis of digits and no clubbing  NEURO: alert & oriented x 3 with fluent speech, no focal motor/sensory deficits  LABORATORY DATA:  I have reviewed the data as listed    Component Value Date/Time   NA 141 09/15/2014 0904   K 3.2* 09/15/2014 0904   CO2 25 09/15/2014 0904   GLUCOSE 136 09/15/2014 0904   BUN 6.8* 09/15/2014 0904   CREATININE 0.9 09/15/2014 0904   CALCIUM 9.6 09/15/2014 0904   PROT 6.5 09/15/2014 0904   ALBUMIN 3.4* 09/15/2014 0904   AST 22 09/15/2014 0904   ALT 38 09/15/2014 0904   ALKPHOS 53 09/15/2014 0904   BILITOT 0.50 09/15/2014 0904    No results found for this basename: SPEP, UPEP,  kappa and lambda light chains    Lab Results  Component Value Date   WBC 3.0* 09/15/2014   NEUTROABS 1.5 09/15/2014   HGB 11.5* 09/15/2014   HCT 37.6 09/15/2014   MCV 76.9* 09/15/2014   PLT 196 09/15/2014      Chemistry      Component Value Date/Time   NA 141 09/15/2014 0904   K 3.2* 09/15/2014 0904   CO2 25 09/15/2014 0904   BUN 6.8* 09/15/2014 0904   CREATININE 0.9 09/15/2014  0904      Component Value Date/Time   CALCIUM 9.6 09/15/2014 0904   ALKPHOS 53 09/15/2014 0904   AST 22 09/15/2014 0904   ALT 38 09/15/2014 0904   BILITOT 0.50 09/15/2014 0904     ASSESSMENT & PLAN:  Multiple myeloma without remission Repeat bone marrow aspirate and biopsy is pending. We will discontinue Velcade therapy. If her bone marrow confirmed remission, she will go on maintain a therapy with Revlimid. We will contact the patient once we have results of her bone marrow biopsy.  DVT (deep venous thrombosis) We discussed the risk, benefit, side effects of discontinuation of Xarelto versus placing her on aspirin therapy. For now, I would leave that on Xarelto as she is doing well without any bleeding complications  Leukopenia due to antineoplastic chemotherapy This is likely due to recent treatment. The patient denies  recent history of fevers, cough, chills, diarrhea or dysuria. She is asymptomatic from the leukopenia. I will observe for now.        Abnormal thyroid function test The thyroid function test is not normal. Awaiting final recommendation from endocrinologist.  Hypokalemia This likely due to recent diarrhea and hydrochlorothiazide. I recommend she reduce her diuretic by half. I recommend potassium rich diet.       All questions were answered. The patient knows to call the clinic with any problems, questions or concerns. No barriers to learning was detected. I spent 25 minutes counseling the patient face to face. The total time spent in the appointment was 30 minutes and more than 50% was on counseling and review of test results     [facility], [place], MD 09/15/2014 8:27 PM

## 2014-09-15 NOTE — Assessment & Plan Note (Signed)
Repeat bone marrow aspirate and biopsy is pending. We will discontinue Velcade therapy. If her bone marrow confirmed remission, she will go on maintain a therapy with Revlimid. We will contact the patient once we have results of her bone marrow biopsy.

## 2014-09-15 NOTE — Assessment & Plan Note (Signed)
We discussed the risk, benefit, side effects of discontinuation of Xarelto versus placing her on aspirin therapy. For now, I would leave that on Xarelto as she is doing well without any bleeding complications

## 2014-09-15 NOTE — Assessment & Plan Note (Signed)
The thyroid function test is not normal. Awaiting final recommendation from endocrinologist.

## 2014-09-15 NOTE — Assessment & Plan Note (Signed)
This likely due to recent diarrhea and hydrochlorothiazide. I recommend she reduce her diuretic by half. I recommend potassium rich diet.

## 2014-09-15 NOTE — Assessment & Plan Note (Signed)
This is likely due to recent treatment. The patient denies recent history of fevers, cough, chills, diarrhea or dysuria. She is asymptomatic from the leukopenia. I will observe for now.    

## 2014-09-15 NOTE — Progress Notes (Signed)
09/15/2014 Patient in to clinic this morning for evaluation at the end of induction Cycle 12. Bone marrow procedure was performed on Thursday, October 15th, including collection of bone marrow core/aspirate and peripheral blood samples for research. Upon arrival to clinic today, Quality of Life assessments were completed independently by the patient upon arrival to the clinic today.  Patient returned completed cycle 12 Patient Medication Calendar, confirming correct dosing as prescribed, including holding therapy following Day 9 dose to allow for further evaluation of abnormal TSH.  Patient reports soft stools on September 26-27, but denies any diarrhea. She continues to have mild intermittent left-sided abdominal pain (9/25-10/2/15). She was seen by her PCP who has prescribed Metformin for elevated blood glucose. Additional thyroid testing was also performed, with no additional changes in management indicated. Patient reported moderate bone pain resulting in insomnia at beginning of cycle (grade 2). She has continued to experience mild back pain and fatigue (grade 1). Patient and MD are aware that we are continuing to collect data at end of cycle 12 to assess for eligibility to enroll in Step 2 maintenance. Plans made to follow up next week when additional results have been received. [name] BSN, RN, [place] 09/15/2014 3:44 PM

## 2014-09-19 ENCOUNTER — Encounter: Payer: Self-pay | Admitting: *Deleted

## 2014-09-19 LAB — KAPPA/LAMBDA LIGHT CHAINS
Kappa free light chain: 0.72 mg/dL (ref 0.33–1.94)
Kappa:Lambda Ratio: 0.53 (ref 0.26–1.65)
Lambda Free Lght Chn: 1.36 mg/dL (ref 0.57–2.63)

## 2014-09-19 LAB — SPEP & IFE WITH QIG
Albumin ELP: 62 % (ref 55.8–66.1)
Alpha-1-Globulin: 3.4 % (ref 2.9–4.9)
Alpha-2-Globulin: 9.3 % (ref 7.1–11.8)
Beta 2: 4.3 % (ref 3.2–6.5)
Beta Globulin: 7.3 % — ABNORMAL HIGH (ref 4.7–7.2)
Gamma Globulin: 13.7 % (ref 11.1–18.8)
IgA: 63 mg/dL — ABNORMAL LOW (ref 69–380)
IgG (Immunoglobin G), Serum: 859 mg/dL (ref 690–1700)
IgM, Serum: 42 mg/dL — ABNORMAL LOW (ref 52–322)
M-Spike, %: 0.33 g/dL
Total Protein, Serum Electrophoresis: 6.3 g/dL (ref 6.0–8.3)

## 2014-09-19 NOTE — Progress Notes (Signed)
09/19/2014 Disease assessment values including myeloma labs and bone marrow pathology report received, indicating partial response to treatment, without evidence of progression. Patient has no adverse events related to Step 1 therapy that are higher than grade 2 in severity. She has received no additional treatment outside of protocol therapy. Documented performance status and lab values meet criteria for step 2 randomization. Patient has agreed to continued participation in and compliance with the [facility] program. Gwynn Burly. Brigitte Pulse BSN, RN, [place] 09/19/2014 4:07 PM

## 2014-09-20 ENCOUNTER — Other Ambulatory Visit: Payer: Self-pay | Admitting: *Deleted

## 2014-09-20 ENCOUNTER — Other Ambulatory Visit: Payer: Self-pay | Admitting: Hematology and Oncology

## 2014-09-20 DIAGNOSIS — C9000 Multiple myeloma not having achieved remission: Secondary | ICD-10-CM

## 2014-09-20 MED ORDER — LENALIDOMIDE 15 MG PO CAPS: 15.0000 mg | ORAL_CAPSULE | Freq: Every day | ORAL | Status: DC

## 2014-09-20 NOTE — Progress Notes (Signed)
09/20/2014 Partial response to treatment confirmed by Dr. Alvy Bimler. Adverse events were reviewed by Dr. Alvy Bimler. Abnormal TSH felt to be unrelated to protocol therapy, and patient continues on replacement therapy, with monitoring by her PCP, Dr. Lysle Rubens. Per Dr. Alvy Bimler, will continue with q3 month monitoring of thyroid function throughout maintenance therapy.  Plan is for randomization to maintenance therapy today, with submission of new Revlimid prescription by collaborative nurse. If possible, Cycle 1 maintenance will be initiated this Friday. MD requests one month follow-up appointment, with monthly CBC and CMET results. [name] BSN, RN, CCRP 09/20/2014 9:18 AM

## 2014-09-20 NOTE — Progress Notes (Signed)
09/20/2014 Eligibility was reviewed and confirmed by Kenton Kingfisher RN. Patient was randomized to maintenance therapy, with treatment assignment Arm D Indefinite maintenance with lenalidomide. [name] BSN, RN, CCRP 09/20/2014 2:17 PM

## 2014-09-21 LAB — CHROMOSOME ANALYSIS, BONE MARROW

## 2014-09-21 LAB — TISSUE HYBRIDIZATION (BONE MARROW)-NCBH

## 2014-09-21 NOTE — Telephone Encounter (Signed)
RECEIVED A FAX FROM [employer] CONCERNING A CONFIRMATION OF FACSIMILE RECEIPT FOR PT. REFERRAL. 

## 2014-09-22 ENCOUNTER — Telehealth: Payer: Self-pay | Admitting: *Deleted

## 2014-09-22 ENCOUNTER — Telehealth: Payer: Self-pay | Admitting: Hematology and Oncology

## 2014-09-22 ENCOUNTER — Other Ambulatory Visit: Payer: Self-pay | Admitting: *Deleted

## 2014-09-22 NOTE — Telephone Encounter (Signed)
, °

## 2014-09-22 NOTE — Telephone Encounter (Signed)
09/22/2014 Received telephone call from patient this morning. She has been contacted by [facility], and her prescription for maintenance lenalidomide is to arrive on Monday. Plans made with patient for lab (CBC and creatinine required within 7 days prior to treatment start) with port flush, due now, on Tuesday, October 27th. After lab results are received, patient will begin Cycle 1, Day 1 lenalidomide, on either Tuesday 10/27 or Wednesday 10/28. Will follow-up with patient on Tuesday regarding the plan and future appointments as previously discussed. [name] BSN, RN, CCRP 09/22/2014 9:30 AM

## 2014-09-25 ENCOUNTER — Other Ambulatory Visit: Payer: Self-pay | Admitting: *Deleted

## 2014-09-25 NOTE — Telephone Encounter (Signed)
PLease add her to be seen on 11/24 at 830 am, 30 mins

## 2014-09-25 NOTE — Telephone Encounter (Signed)
RECEIVED A FAX FROM [facility] CONCERNING A CONFIRMATION OF PRESCRIPTION SHIPMENT FOR REVLIMID ON 09/22/14.

## 2014-09-26 ENCOUNTER — Encounter: Payer: Self-pay | Admitting: Hematology and Oncology

## 2014-09-26 ENCOUNTER — Ambulatory Visit: Payer: 59

## 2014-09-26 ENCOUNTER — Telehealth: Payer: Self-pay | Admitting: Hematology and Oncology

## 2014-09-26 ENCOUNTER — Encounter: Payer: Self-pay | Admitting: *Deleted

## 2014-09-26 ENCOUNTER — Other Ambulatory Visit: Payer: Self-pay | Admitting: Hematology and Oncology

## 2014-09-26 ENCOUNTER — Other Ambulatory Visit (HOSPITAL_BASED_OUTPATIENT_CLINIC_OR_DEPARTMENT_OTHER): Payer: 59

## 2014-09-26 VITALS — BP 132/78 | HR 75 | Temp 97.9°F

## 2014-09-26 DIAGNOSIS — C9000 Multiple myeloma not having achieved remission: Secondary | ICD-10-CM

## 2014-09-26 DIAGNOSIS — Z95828 Presence of other vascular implants and grafts: Secondary | ICD-10-CM

## 2014-09-26 LAB — CBC WITH DIFFERENTIAL/PLATELET
BASO%: 0.6 % (ref 0.0–2.0)
Basophils Absolute: 0 10*3/uL (ref 0.0–0.1)
EOS%: 1 % (ref 0.0–7.0)
Eosinophils Absolute: 0 10*3/uL (ref 0.0–0.5)
HCT: 34.1 % — ABNORMAL LOW (ref 34.8–46.6)
HGB: 10.6 g/dL — ABNORMAL LOW (ref 11.6–15.9)
LYMPH%: 35.1 % (ref 14.0–49.7)
MCH: 23.4 pg — ABNORMAL LOW (ref 25.1–34.0)
MCHC: 31 g/dL — ABNORMAL LOW (ref 31.5–36.0)
MCV: 75.5 fL — ABNORMAL LOW (ref 79.5–101.0)
MONO#: 0.5 10*3/uL (ref 0.1–0.9)
MONO%: 15.1 % — ABNORMAL HIGH (ref 0.0–14.0)
NEUT#: 1.6 10*3/uL (ref 1.5–6.5)
NEUT%: 48.2 % (ref 38.4–76.8)
Platelets: 294 10*3/uL (ref 145–400)
RBC: 4.52 10*6/uL (ref 3.70–5.45)
RDW: 14.7 % — ABNORMAL HIGH (ref 11.2–14.5)
WBC: 3.3 10*3/uL — ABNORMAL LOW (ref 3.9–10.3)
lymph#: 1.2 10*3/uL (ref 0.9–3.3)

## 2014-09-26 LAB — COMPREHENSIVE METABOLIC PANEL (CC13)
ALT: 32 U/L (ref 0–55)
AST: 21 U/L (ref 5–34)
Albumin: 3.2 g/dL — ABNORMAL LOW (ref 3.5–5.0)
Alkaline Phosphatase: 45 U/L (ref 40–150)
Anion Gap: 8 mEq/L (ref 3–11)
BUN: 8 mg/dL (ref 7.0–26.0)
CO2: 28 mEq/L (ref 22–29)
Calcium: 9 mg/dL (ref 8.4–10.4)
Chloride: 108 mEq/L (ref 98–109)
Creatinine: 0.8 mg/dL (ref 0.6–1.1)
Glucose: 109 mg/dl (ref 70–140)
Potassium: 3.3 mEq/L — ABNORMAL LOW (ref 3.5–5.1)
Sodium: 144 mEq/L (ref 136–145)
Total Bilirubin: 0.35 mg/dL (ref 0.20–1.20)
Total Protein: 6 g/dL — ABNORMAL LOW (ref 6.4–8.3)

## 2014-09-26 MED ORDER — LENALIDOMIDE 15 MG PO CAPS CTSU E1A11: ORAL_CAPSULE | ORAL | Status: DC

## 2014-09-26 MED ORDER — SODIUM CHLORIDE 0.9 % IJ SOLN
10.0000 mL | INTRAMUSCULAR | Status: DC | PRN
  Administered 2014-09-26: 10 mL via INTRAVENOUS
  Filled 2014-09-26: qty 10

## 2014-09-26 NOTE — Telephone Encounter (Signed)
s.w pt and advised on NOV appt....pt ok adn aware °

## 2014-09-26 NOTE — Patient Instructions (Signed)

## 2014-09-26 NOTE — Progress Notes (Signed)
09/26/2014 Patient in to clinic today for Day 1 lab tests prior to initiating treatment. Patient confirmed that her Revlimid prescription was received yesterday. Lab results today are within parameters for initiating treatment. Patient was notified by cell phone that results are acceptable, and that she should begin dosing with Revlimid today, at 15mg  daily x 21 days, followed by 7 days off. Patient was given a copy of her appointment calendar with dosing days marked. Patient was notified that her November 24th appointments will be updated to include a one month MD appointment for follow-up. Patient was instructed not to begin Cycle 2 dosing on 11/24 until after she has been seen in clinic and lab results have been obtained. Patient verbalized understanding. [name] BSN, RN, [place] 09/26/2014 9:27 AM

## 2014-09-28 ENCOUNTER — Telehealth: Payer: Self-pay | Admitting: Hematology and Oncology

## 2014-09-28 ENCOUNTER — Other Ambulatory Visit: Payer: Self-pay | Admitting: *Deleted

## 2014-09-28 ENCOUNTER — Encounter: Payer: Self-pay | Admitting: *Deleted

## 2014-09-28 NOTE — Telephone Encounter (Signed)
s.w. pt and advised on all appt...pt ok and aware °

## 2014-10-02 DIAGNOSIS — Z0289 Encounter for other administrative examinations: Secondary | ICD-10-CM

## 2014-10-11 ENCOUNTER — Encounter (HOSPITAL_COMMUNITY): Payer: Self-pay

## 2014-10-13 ENCOUNTER — Other Ambulatory Visit: Payer: Self-pay | Admitting: *Deleted

## 2014-10-13 ENCOUNTER — Other Ambulatory Visit: Payer: Self-pay

## 2014-10-13 DIAGNOSIS — Z1231 Encounter for screening mammogram for malignant neoplasm of breast: Secondary | ICD-10-CM

## 2014-10-13 DIAGNOSIS — C9000 Multiple myeloma not having achieved remission: Secondary | ICD-10-CM

## 2014-10-13 NOTE — Telephone Encounter (Signed)
Refill request for Revlimid 15 mg to MD desk for approval.

## 2014-10-16 MED ORDER — LENALIDOMIDE 15 MG PO CAPS CTSU E1A11: ORAL_CAPSULE | ORAL | Status: DC

## 2014-10-16 NOTE — Addendum Note (Signed)
Addended by: Wyonia Hough on: 10/16/2014 01:08 PM   Modules accepted: Orders

## 2014-10-16 NOTE — Telephone Encounter (Signed)
RECEIVED A FAX FROM [employer] CONCERNING A CONFIRMATION OF FACSIMILE RECEIPT FOR PT. REFERRAL. 

## 2014-10-17 ENCOUNTER — Ambulatory Visit
Admission: RE | Admit: 2014-10-17 | Discharge: 2014-10-17 | Disposition: A | Discharge status: Home / Self Care | Payer: 59 | Source: Ambulatory Visit

## 2014-10-17 DIAGNOSIS — Z1231 Encounter for screening mammogram for malignant neoplasm of breast: Secondary | ICD-10-CM

## 2014-10-20 NOTE — Telephone Encounter (Signed)
Received a fax confirmation from [facility] that the Revlimid was shipped to the patient on 10-19-14.

## 2014-10-23 ENCOUNTER — Other Ambulatory Visit: Payer: Self-pay | Admitting: *Deleted

## 2014-10-23 DIAGNOSIS — C9000 Multiple myeloma not having achieved remission: Secondary | ICD-10-CM

## 2014-10-24 ENCOUNTER — Encounter: Payer: 59 | Admitting: *Deleted

## 2014-10-24 ENCOUNTER — Encounter: Payer: Self-pay | Admitting: Hematology and Oncology

## 2014-10-24 ENCOUNTER — Other Ambulatory Visit: Payer: 59

## 2014-10-24 ENCOUNTER — Other Ambulatory Visit (HOSPITAL_BASED_OUTPATIENT_CLINIC_OR_DEPARTMENT_OTHER): Payer: 59

## 2014-10-24 ENCOUNTER — Ambulatory Visit (HOSPITAL_BASED_OUTPATIENT_CLINIC_OR_DEPARTMENT_OTHER): Payer: 59 | Admitting: Hematology and Oncology

## 2014-10-24 ENCOUNTER — Telehealth: Payer: Self-pay | Admitting: Hematology and Oncology

## 2014-10-24 VITALS — BP 121/70 | HR 54 | Temp 98.1°F | Resp 18 | Ht 67.0 in | Wt 182.2 lb

## 2014-10-24 DIAGNOSIS — C9000 Multiple myeloma not having achieved remission: Secondary | ICD-10-CM

## 2014-10-24 DIAGNOSIS — D701 Agranulocytosis secondary to cancer chemotherapy: Secondary | ICD-10-CM

## 2014-10-24 DIAGNOSIS — M898X9 Other specified disorders of bone, unspecified site: Secondary | ICD-10-CM

## 2014-10-24 DIAGNOSIS — D630 Anemia in neoplastic disease: Secondary | ICD-10-CM

## 2014-10-24 DIAGNOSIS — M899 Disorder of bone, unspecified: Secondary | ICD-10-CM

## 2014-10-24 DIAGNOSIS — T451X5A Adverse effect of antineoplastic and immunosuppressive drugs, initial encounter: Secondary | ICD-10-CM

## 2014-10-24 DIAGNOSIS — E559 Vitamin D deficiency, unspecified: Secondary | ICD-10-CM

## 2014-10-24 HISTORY — DX: Vitamin D deficiency, unspecified: E55.9

## 2014-10-24 LAB — COMPREHENSIVE METABOLIC PANEL (CC13)
ALT: 46 U/L (ref 0–55)
AST: 25 U/L (ref 5–34)
Albumin: 3.4 g/dL — ABNORMAL LOW (ref 3.5–5.0)
Alkaline Phosphatase: 50 U/L (ref 40–150)
Anion Gap: 7 mEq/L (ref 3–11)
BUN: 7.2 mg/dL (ref 7.0–26.0)
CO2: 27 mEq/L (ref 22–29)
Calcium: 9.1 mg/dL (ref 8.4–10.4)
Chloride: 108 mEq/L (ref 98–109)
Creatinine: 0.8 mg/dL (ref 0.6–1.1)
Glucose: 99 mg/dl (ref 70–140)
Potassium: 3.5 mEq/L (ref 3.5–5.1)
Sodium: 143 mEq/L (ref 136–145)
Total Bilirubin: 0.59 mg/dL (ref 0.20–1.20)
Total Protein: 6.2 g/dL — ABNORMAL LOW (ref 6.4–8.3)

## 2014-10-24 LAB — CBC WITH DIFFERENTIAL/PLATELET
BASO%: 1.3 % (ref 0.0–2.0)
Basophils Absolute: 0 10*3/uL (ref 0.0–0.1)
EOS%: 1.1 % (ref 0.0–7.0)
Eosinophils Absolute: 0 10*3/uL (ref 0.0–0.5)
HCT: 35.2 % (ref 34.8–46.6)
HGB: 10.8 g/dL — ABNORMAL LOW (ref 11.6–15.9)
LYMPH%: 47.4 % (ref 14.0–49.7)
MCH: 23.2 pg — ABNORMAL LOW (ref 25.1–34.0)
MCHC: 30.8 g/dL — ABNORMAL LOW (ref 31.5–36.0)
MCV: 75.2 fL — ABNORMAL LOW (ref 79.5–101.0)
MONO#: 0.6 10*3/uL (ref 0.1–0.9)
MONO%: 18.2 % — ABNORMAL HIGH (ref 0.0–14.0)
NEUT#: 1.1 10*3/uL — ABNORMAL LOW (ref 1.5–6.5)
NEUT%: 32 % — ABNORMAL LOW (ref 38.4–76.8)
Platelets: 265 10*3/uL (ref 145–400)
RBC: 4.68 10*6/uL (ref 3.70–5.45)
RDW: 14.7 % — ABNORMAL HIGH (ref 11.2–14.5)
WBC: 3.4 10*3/uL — ABNORMAL LOW (ref 3.9–10.3)
lymph#: 1.6 10*3/uL (ref 0.9–3.3)

## 2014-10-24 MED ORDER — TRAMADOL HCL 50 MG PO TABS: 50.0000 mg | ORAL_TABLET | Freq: Four times a day (QID) | ORAL | Status: DC | PRN

## 2014-10-24 MED ORDER — ACYCLOVIR 400 MG PO TABS
400.0000 mg | ORAL_TABLET | Freq: Every day | ORAL | Status: DC
Start: 2014-10-24 — End: 2015-02-07

## 2014-10-24 NOTE — Progress Notes (Signed)
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   05/05/2014 Imaging Imaging study of the neck showed no explanation that could cause right neck pain. She is noted to have incidental left upper lung nodule. Plan to repeat imaging study in 3 months.   09/06/2014 Imaging Bone survey showed no evidence of fracture   09/14/2014 Bone Marrow Biopsy Bone marrow biopsy showed 8% residual plasma cells by manual count but none on the biopsy specimen   09/26/2014 -  Chemotherapy She is started on cycle 1 of maintenance Revlimid    INTERVAL HISTORY: Please see below for problem oriented charting. She tolerated treatment well. Complained of mild diffuse bone pain intermittently.  REVIEW OF SYSTEMS:   Constitutional: Denies fevers, chills or abnormal weight loss Eyes: Denies blurriness of vision Ears, nose, mouth, throat, and face: Denies mucositis or sore throat Respiratory: Denies cough, dyspnea or wheezes Cardiovascular: Denies palpitation, chest discomfort or lower extremity swelling Gastrointestinal:  Denies nausea, heartburn or change in bowel habits Skin: Denies abnormal skin rashes Lymphatics: Denies new  lymphadenopathy or easy bruising Neurological:Denies numbness, tingling or new weaknesses Behavioral/Psych: Mood is stable, no new changes  All other systems were reviewed with the patient and are negative.  I have reviewed the past medical history, past surgical history, social history and family history with the patient and they are unchanged from previous note.  ALLERGIES:  has No Known Allergies.  MEDICATIONS:  Current Outpatient Prescriptions  Medication Sig Dispense Refill  . acyclovir (ZOVIRAX) 400 MG tablet Take 1 tablet (400 mg total) by mouth daily. Per Dr. Alvy Bimler, continue for one year post-Velcade. 90 tablet 3  . calcium carbonate (TUMS - DOSED IN MG ELEMENTAL CALCIUM) 500 MG chewable tablet Chew 1 tablet by mouth as needed for indigestion or heartburn.    . carboxymethylcellulose (REFRESH PLUS) 0.5 % SOLN Place 1 drop into both eyes daily as needed (for dry eyes).     . Cholecalciferol (VITAMIN D3) 2000 UNITS TABS Take 2,000 Units by mouth daily.    . hydrochlorothiazide (HYDRODIURIL) 25 MG tablet Take 25 mg by mouth daily with breakfast.     . lenalidomide (REVLIMID) 15 MG capsule Take 1 capsule daily on days 1-21. Repeat every 28 days. 21 capsule 0  . levothyroxine (SYNTHROID, LEVOTHROID) 75 MCG tablet Take 75 mcg by mouth daily before breakfast.    . lidocaine-prilocaine (EMLA) cream Apply 1 application topically as needed (port).    . metFORMIN (GLUCOPHAGE) 500 MG tablet Take 500 mg by mouth 2 (two) times daily with a meal.    . Multiple Vitamins-Minerals (CENTRUM SILVER PO) Take 1 tablet by mouth daily.     . Oxycodone HCl 10 MG TABS Take 5-10 mg by mouth every 4 (four) hours as needed (for  pain).    . polyethylene glycol (MIRALAX / GLYCOLAX) packet Take 17 g by mouth daily.    . rivaroxaban (XARELTO) 20 MG TABS tablet Take 20 mg by mouth daily with supper.    . traMADol (ULTRAM) 50 MG tablet Take 1 tablet (50 mg total) by mouth every 6 (six) hours as needed for moderate  pain. 90 tablet 0  . venlafaxine XR (EFFEXOR-XR) 37.5 MG 24 hr capsule Take 37.5 mg by mouth daily with breakfast.    . verapamil (CALAN-SR) 120 MG CR tablet Take 120 mg by mouth every morning.     No current facility-administered medications for this visit.    PHYSICAL EXAMINATION: ECOG PERFORMANCE STATUS: 1 - Symptomatic but completely ambulatory  Filed Vitals:   10/24/14 0837  BP: 121/70  Pulse: 54  Temp: 98.1 F (36.7 C)  Resp: 18   Filed Weights   10/24/14 0837  Weight: 182 lb 3.2 oz (82.645 kg)    GENERAL:alert, no distress and comfortable SKIN: skin color, texture, turgor are normal, no rashes or significant lesions EYES: normal, Conjunctiva are pink and non-injected, sclera clear OROPHARYNX:no exudate, no erythema and lips, buccal mucosa, and tongue normal  NECK: supple, thyroid normal size, non-tender, without nodularity LYMPH:  no palpable lymphadenopathy in the cervical, axillary or inguinal LUNGS: clear to auscultation and percussion with normal breathing effort HEART: regular rate & rhythm and no murmurs and no lower extremity edema ABDOMEN:abdomen soft, non-tender and normal bowel sounds Musculoskeletal:no cyanosis of digits and no clubbing  NEURO: alert & oriented x 3 with fluent speech, no focal motor/sensory deficits  LABORATORY DATA:  I have reviewed the data as listed    Component Value Date/Time   NA 143 10/24/2014 0824   K 3.5 10/24/2014 0824   CO2 27 10/24/2014 0824   GLUCOSE 99 10/24/2014 0824   BUN 7.2 10/24/2014 0824   CREATININE 0.8 10/24/2014 0824   CALCIUM 9.1 10/24/2014 0824   PROT 6.2* 10/24/2014 0824   ALBUMIN 3.4* 10/24/2014 0824   AST 25 10/24/2014 0824   ALT 46 10/24/2014 0824   ALKPHOS 50 10/24/2014 0824   BILITOT 0.59 10/24/2014 0824    No results found for: SPEP, UPEP  Lab Results  Component Value Date   WBC 3.4* 10/24/2014   NEUTROABS 1.1* 10/24/2014   HGB 10.8* 10/24/2014   HCT 35.2 10/24/2014   MCV 75.2* 10/24/2014    PLT 265 10/24/2014      Chemistry      Component Value Date/Time   NA 143 10/24/2014 0824   K 3.5 10/24/2014 0824   CO2 27 10/24/2014 0824   BUN 7.2 10/24/2014 0824   CREATININE 0.8 10/24/2014 0824      Component Value Date/Time   CALCIUM 9.1 10/24/2014 0824   ALKPHOS 50 10/24/2014 0824   AST 25 10/24/2014 0824   ALT 46 10/24/2014 0824   BILITOT 0.59 10/24/2014 0824      ASSESSMENT & PLAN:  Multiple myeloma without remission She tolerated Revlimid well without side-effects. I will schedule to remove her port   Anemia in neoplastic disease This is likely due to recent treatment. The patient denies recent history of bleeding such as epistaxis, hematuria or hematochezia. She is asymptomatic from the anemia. I will observe for now.    Leukopenia due to antineoplastic chemotherapy This is likely due to recent treatment. The patient denies recent history of fevers, cough, chills, diarrhea or dysuria. She is asymptomatic from the leukopenia. I will observe for now.  Bone pain This is unlikely due to her disease, could be related to severe vitamin D deficiency. I planned to recheck vitamin D level in her next visit I recommend her to continue taking oxycodone or tramadol as needed.     Orders Placed This Encounter  Procedures  . IR Removal Tun Access W/ Port W/O FL    Standing Status: Future     Number of Occurrences:      Standing Expiration Date: 12/25/2015    Order Specific Question:  Reason for exam:    Answer:  remove port, no longer needed    Order Specific Question:  Is the patient pregnant?    Answer:  No    Order Specific Question:  Preferred Imaging Location?    Answer:  [facility]  . Vitamin D 25 hydroxy    Standing Status: Future     Number of Occurrences:      Standing Expiration Date: 10/24/2015   All questions were answered. The patient knows to call the clinic with any problems, questions or concerns. No barriers to learning was  detected. I spent 25 minutes counseling the patient face to face. The total time spent in the appointment was 30 minutes and more than 50% was on counseling and review of test results     [facility], [place], MD 10/24/2014 9:00 PM

## 2014-10-24 NOTE — Assessment & Plan Note (Signed)
This is likely due to recent treatment. The patient denies recent history of fevers, cough, chills, diarrhea or dysuria. She is asymptomatic from the leukopenia. I will observe for now.    

## 2014-10-24 NOTE — Telephone Encounter (Signed)
gv and pritned pt avs..Marland Kitchenpt was already sched

## 2014-10-24 NOTE — Assessment & Plan Note (Signed)
She tolerated Revlimid well without side-effects. I will schedule to remove her port

## 2014-10-24 NOTE — Assessment & Plan Note (Signed)
This is unlikely due to her disease, could be related to severe vitamin D deficiency. I planned to recheck vitamin D level in her next visit I recommend her to continue taking oxycodone or tramadol as needed.

## 2014-10-24 NOTE — Progress Notes (Signed)
10/24/2014 Patient in to clinic this morning for Cycle 2 labwork. Per MD choice, patient will also be seen for history and physical. Patient reports having more bone pain, most notably in her back and joints, since the beginning of this cycle. She also reports loose stools daily, averaging 2-3 per day (grade 1 diarrhea). She reports muscle pains and occasional abdominal pains, with nausea on one day, but no vomiting. She reports a couple of days with itching and swelling of hands. She reports her usual migraine headaches. Based on lab results review and history and physical by Dr. Alvy Bimler, patient condition is acceptable for continued treatment. Plans made for port removal, and patient is in agreement. [name] BSN, RN, CCRP 10/24/2014 1:23 PM

## 2014-10-24 NOTE — Assessment & Plan Note (Addendum)
This is likely due to recent treatment. The patient denies recent history of bleeding such as epistaxis, hematuria or hematochezia. She is asymptomatic from the anemia. I will observe for now.   

## 2014-10-25 ENCOUNTER — Telehealth: Payer: Self-pay | Admitting: *Deleted

## 2014-10-25 NOTE — Telephone Encounter (Signed)
Per Dr. Alvy Bimler,  Pt can d/c Xarelto.  She does not need to take anymore.  Continue baby aspirin daily.  Pt verbalized understanding.

## 2014-10-27 ENCOUNTER — Other Ambulatory Visit: Payer: Self-pay | Admitting: Radiology

## 2014-10-30 ENCOUNTER — Other Ambulatory Visit: Payer: Self-pay | Admitting: Radiology

## 2014-10-31 ENCOUNTER — Encounter (HOSPITAL_COMMUNITY): Payer: Self-pay

## 2014-10-31 ENCOUNTER — Ambulatory Visit (HOSPITAL_COMMUNITY)
Admission: RE | Admit: 2014-10-31 | Discharge: 2014-10-31 | Disposition: A | Discharge status: Home / Self Care | Payer: 59 | Source: Ambulatory Visit | Attending: Hematology and Oncology | Admitting: Hematology and Oncology

## 2014-10-31 DIAGNOSIS — G43909 Migraine, unspecified, not intractable, without status migrainosus: Secondary | ICD-10-CM | POA: Diagnosis not present

## 2014-10-31 DIAGNOSIS — E559 Vitamin D deficiency, unspecified: Secondary | ICD-10-CM | POA: Diagnosis not present

## 2014-10-31 DIAGNOSIS — D649 Anemia, unspecified: Secondary | ICD-10-CM | POA: Insufficient documentation

## 2014-10-31 DIAGNOSIS — Z86718 Personal history of other venous thrombosis and embolism: Secondary | ICD-10-CM | POA: Diagnosis not present

## 2014-10-31 DIAGNOSIS — Z452 Encounter for adjustment and management of vascular access device: Secondary | ICD-10-CM | POA: Diagnosis present

## 2014-10-31 DIAGNOSIS — E119 Type 2 diabetes mellitus without complications: Secondary | ICD-10-CM | POA: Diagnosis not present

## 2014-10-31 DIAGNOSIS — C9000 Multiple myeloma not having achieved remission: Secondary | ICD-10-CM

## 2014-10-31 HISTORY — PX: PORT-A-CATH REMOVAL: SHX5289

## 2014-10-31 LAB — CBC WITH DIFFERENTIAL/PLATELET
Basophils Absolute: 0 10*3/uL (ref 0.0–0.1)
Basophils Relative: 0 % (ref 0–1)
Eosinophils Absolute: 0.1 10*3/uL (ref 0.0–0.7)
Eosinophils Relative: 2 % (ref 0–5)
HCT: 37.2 % (ref 36.0–46.0)
Hemoglobin: 11.6 g/dL — ABNORMAL LOW (ref 12.0–15.0)
Lymphocytes Relative: 38 % (ref 12–46)
Lymphs Abs: 1.1 10*3/uL (ref 0.7–4.0)
MCH: 23 pg — ABNORMAL LOW (ref 26.0–34.0)
MCHC: 31.2 g/dL (ref 30.0–36.0)
MCV: 73.8 fL — ABNORMAL LOW (ref 78.0–100.0)
Monocytes Absolute: 0.2 10*3/uL (ref 0.1–1.0)
Monocytes Relative: 8 % (ref 3–12)
Neutro Abs: 1.5 10*3/uL — ABNORMAL LOW (ref 1.7–7.7)
Neutrophils Relative %: 52 % (ref 43–77)
Platelets: 247 10*3/uL (ref 150–400)
RBC: 5.04 MIL/uL (ref 3.87–5.11)
RDW: 14.8 % (ref 11.5–15.5)
WBC: 2.9 10*3/uL — ABNORMAL LOW (ref 4.0–10.5)

## 2014-10-31 LAB — PROTIME-INR
INR: 1.02 (ref 0.00–1.49)
Prothrombin Time: 13.5 seconds (ref 11.6–15.2)

## 2014-10-31 LAB — APTT: aPTT: 26 seconds (ref 24–37)

## 2014-10-31 MED ORDER — MIDAZOLAM HCL 2 MG/2ML IJ SOLN
INTRAMUSCULAR | Status: AC | PRN
  Administered 2014-10-31: 2 mg via INTRAVENOUS

## 2014-10-31 MED ORDER — SODIUM CHLORIDE 0.9 % IV SOLN
INTRAVENOUS | Status: DC
  Administered 2014-10-31: 13:00:00 via INTRAVENOUS

## 2014-10-31 MED ORDER — LIDOCAINE HCL 1 % IJ SOLN
INTRAMUSCULAR | Status: DC
Start: 2014-10-31 — End: 2014-11-01
  Filled 2014-10-31: qty 20

## 2014-10-31 MED ORDER — CEFAZOLIN SODIUM-DEXTROSE 2-3 GM-% IV SOLR
2.0000 g | INTRAVENOUS | Status: AC
  Administered 2014-10-31: 2 g via INTRAVENOUS

## 2014-10-31 MED ORDER — MIDAZOLAM HCL 2 MG/2ML IJ SOLN
INTRAMUSCULAR | Status: AC
  Filled 2014-10-31: qty 2

## 2014-10-31 MED ORDER — CEFAZOLIN SODIUM-DEXTROSE 2-3 GM-% IV SOLR
INTRAVENOUS | Status: AC
  Filled 2014-10-31: qty 50

## 2014-10-31 MED ORDER — FENTANYL CITRATE 0.05 MG/ML IJ SOLN
INTRAMUSCULAR | Status: AC | PRN
  Administered 2014-10-31: 100 ug via INTRAVENOUS

## 2014-10-31 MED ORDER — FENTANYL CITRATE 0.05 MG/ML IJ SOLN
INTRAMUSCULAR | Status: AC
  Filled 2014-10-31: qty 2

## 2014-10-31 NOTE — Procedures (Signed)
Procedure:  Port removal Right chest port removed in entirety.  No complications.

## 2014-10-31 NOTE — Discharge Instructions (Signed)
Conscious Sedation, Adult, Care After Refer to this sheet in the next few weeks. These instructions provide you with information on caring for yourself after your procedure. Your health care provider may also give you more specific instructions. Your treatment has been planned according to current medical practices, but problems sometimes occur. Call your health care provider if you have any problems or questions after your procedure. WHAT TO EXPECT AFTER THE PROCEDURE  After your procedure:  You may feel sleepy, clumsy, and have poor balance for several hours.  Vomiting may occur if you eat too soon after the procedure. HOME CARE INSTRUCTIONS  Do not participate in any activities where you could become injured for at least 24 hours. Do not:  Drive.  Swim.  Ride a bicycle.  Operate heavy machinery.  Cook.  Use power tools.  Climb ladders.  Work from a high place.  Do not make important decisions or sign legal documents until you are improved.  If you vomit, drink water, juice, or soup when you can drink without vomiting. Make sure you have little or no nausea before eating solid foods.  Only take over-the-counter or prescription medicines for pain, discomfort, or fever as directed by your health care provider.  Make sure you and your family fully understand everything about the medicines given to you, including what side effects may occur.  You should not drink alcohol, take sleeping pills, or take medicines that cause drowsiness for at least 24 hours.  If you smoke, do not smoke without supervision.  If you are feeling better, you may resume normal activities 24 hours after you were sedated.  Keep all appointments with your health care provider. SEEK MEDICAL CARE IF:  Your skin is pale or bluish in color.  You continue to feel nauseous or vomit.  Your pain is getting worse and is not helped by medicine.  You have bleeding or swelling.  You are still sleepy or  feeling clumsy after 24 hours. SEEK IMMEDIATE MEDICAL CARE IF:  You develop a rash.  You have difficulty breathing.  You develop any type of allergic problem.  You have a fever. MAKE SURE YOU:  Understand these instructions.  Will watch your condition.  Will get help right away if you are not doing well or get worse. Document Released: 09/07/2013 Document Reviewed: 09/07/2013 [facility] Patient Information 2015 [place], [place]. This information is not intended to replace advice given to you by your health care provider. Make sure you discuss any questions you have with your health care provider. Remove dressing after 24 hours.

## 2014-10-31 NOTE — H&P (Signed)
Chief Complaint: "I'm here to get my port out"  Referring Physician(s): Gorsuch,Ni  History of Present Illness: Carla Perry is a 58 y.o. female with history of multiple myeloma and no longer requiring use of port a cath for treatment (maintenance Revlimid only). She presents today for port a cath removal.  Past Medical History  Diagnosis Date  . HBP (high blood pressure)   . Thyroid disorder   . Migraine   . Memory loss   . Anemia   . MGUS (monoclonal gammopathy of unknown significance)   . MGUS (monoclonal gammopathy of unknown significance) 10/06/2013  . Anemia, unspecified 10/06/2013  . Bone pain 10/21/2013  . Multiple myeloma, without mention of having achieved remission 11/16/2013  . Seizure 1960    single seizure episode at age 41  . Leukopenia due to antineoplastic chemotherapy 12/27/2013  . Right leg swelling 02/07/2014  . DVT (deep venous thrombosis) 02/07/2014  . Diverticulitis 07/18/2014  . Abnormal thyroid function test 08/30/2014  . 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 has not picked oral med up from pharmacy as of 09-14-14  . Vitamin D deficiency 10/24/2014    Past Surgical History  Procedure Laterality Date  . Hemorrhoid surgery      Allergies: Review of patient's allergies indicates no known allergies.  Medications: Prior to Admission medications   Medication Sig Start Date End Date Taking? Authorizing Provider  acyclovir (ZOVIRAX) 400 MG tablet Take 1 tablet (400 mg total) by mouth daily. Per Dr. Alvy Bimler, continue for one year post-Velcade. 10/24/14  Yes Heath Lark, MD  calcium carbonate (TUMS - DOSED IN MG ELEMENTAL CALCIUM) 500 MG chewable tablet Chew 1 tablet by mouth as needed for indigestion or heartburn.   Yes Historical Provider, MD  carboxymethylcellulose (REFRESH PLUS) 0.5 % SOLN Place 1 drop into both eyes daily as needed (for dry eyes).    Yes Historical Provider, MD  Cholecalciferol (VITAMIN D3) 2000  UNITS TABS Take 2,000 Units by mouth daily.   Yes Historical Provider, MD  hydrochlorothiazide (HYDRODIURIL) 25 MG tablet Take 25 mg by mouth daily with breakfast.    Yes Historical Provider, MD  lenalidomide (REVLIMID) 15 MG capsule Take 1 capsule daily on days 1-21. Repeat every 28 days. 10/16/14  Yes Heath Lark, MD  levothyroxine (SYNTHROID, LEVOTHROID) 75 MCG tablet Take 75 mcg by mouth daily before breakfast.   Yes Historical Provider, MD  lidocaine-prilocaine (EMLA) cream Apply 1 application topically as needed (port). 11/16/13  Yes Heath Lark, MD  metFORMIN (GLUCOPHAGE) 500 MG tablet Take 500 mg by mouth 2 (two) times daily with a meal. 09/13/14  Yes Historical Provider, MD  Multiple Vitamins-Minerals (CENTRUM SILVER PO) Take 1 tablet by mouth daily.    Yes Historical Provider, MD  Oxycodone HCl 10 MG TABS Take 5-10 mg by mouth every 4 (four) hours as needed (for pain).   Yes Historical Provider, MD  polyethylene glycol (MIRALAX / GLYCOLAX) packet Take 17 g by mouth daily.   Yes Historical Provider, MD  traMADol (ULTRAM) 50 MG tablet Take 1 tablet (50 mg total) by mouth every 6 (six) hours as needed for moderate pain. 10/24/14  Yes Heath Lark, MD  venlafaxine XR (EFFEXOR-XR) 37.5 MG 24 hr capsule Take 37.5 mg by mouth daily with breakfast.   Yes Historical Provider, MD  verapamil (CALAN-SR) 120 MG CR tablet Take 120 mg by mouth every morning.   Yes Historical Provider, MD    Family History  Problem  Relation Age of Onset  . Throat cancer Father   . Stroke Father   . Cancer Brother     prostate    History   Social History  . Marital Status: Married    Spouse Name: Carla Perry    Number of Children: 2  . Years of Education: 14   Occupational History  .  Lab [employer]   Social History Main Topics  . Smoking status: Never Smoker   . Smokeless tobacco: Never Used  . Alcohol Use: No  . Drug Use: No  . Sexual Activity: None   Other Topics Concern  . None   Social History Narrative    Patient lives at home with her husband Special educational needs teacher). Patient has two years college.   Right handed.   Caffeine- None        Review of Systems  Constitutional: Negative for fever and chills.  Respiratory: Negative for cough and shortness of breath.   Cardiovascular: Negative for chest pain.  Gastrointestinal: Negative for nausea, vomiting, abdominal pain and blood in stool.  Genitourinary: Negative for dysuria and hematuria.  Musculoskeletal: Negative for back pain.  Neurological: Negative for headaches.     Vital Signs: There were no vitals taken for this visit.  Physical Exam  Constitutional: She is oriented to person, place, and time. She appears well-developed and well-nourished.  Cardiovascular: Normal rate and regular rhythm.   Pulmonary/Chest: Effort normal and breath sounds normal.  Clean, intact rt chest wall PAC  Abdominal: Soft. Bowel sounds are normal. There is no tenderness.  Musculoskeletal: Normal range of motion. She exhibits no edema.  Neurological: She is alert and oriented to person, place, and time.    Imaging: Mm Digital Screening Bilateral  10/17/2014   CLINICAL DATA:  Screening.  EXAM: DIGITAL SCREENING BILATERAL MAMMOGRAM WITH CAD  COMPARISON:  Previous exam(s).  ACR Breast Density Category b: There are scattered areas of fibroglandular density.  FINDINGS: There are no findings suspicious for malignancy. Images were processed with CAD.  IMPRESSION: No mammographic evidence of malignancy. A result letter of this screening mammogram will be mailed directly to the patient.  RECOMMENDATION: Screening mammogram in one year. (Code:[id number]-B-01Y)  BI-RADS CATEGORY  1: Negative.   Electronically Signed   By: Curlene Dolphin M.D.   On: 10/17/2014 14:44    Labs:  CBC:  Recent Labs  09/15/14 0903 09/26/14 0820 10/24/14 0824 10/31/14 1255  WBC 3.0* 3.3* 3.4* 2.9*  HGB 11.5* 10.6* 10.8* 11.6*  HCT 37.6 34.1* 35.2 37.2  PLT 196 294 265 247    COAGS:  Recent  Labs  11/22/13 1135 10/31/14 1255  INR 1.07 1.02  APTT 24 26    BMP:  Recent Labs  08/22/14 0919 09/15/14 0904 09/26/14 0820 10/24/14 0824  NA 142 141 144 143  K 3.4* 3.2* 3.3* 3.5  CO2 _0 GLUCOSE 194* 136 109 99  BUN 8.5 6.8* 8.0 7.2  CALCIUM 9.3 9.6 9.0 9.1  CREATININE 0.8 0.9 0.8 0.8    LIVER FUNCTION TESTS:  Recent Labs  08/22/14 0919 09/15/14 0904 09/26/14 0820 10/24/14 0824  BILITOT 0.49 0.50 0.35 0.59  AST _1 ALT 33 38 32 46  ALKPHOS 50 53 45 50  PROT 6.6 6.5 6.0* 6.2*  ALBUMIN 3.3* 3.4* 3.2* 3.4*    TUMOR MARKERS: No results for input(s): AFPTM, CEA, CA199, CHROMGRNA in the last 8760 hours.  Assessment and Plan: Carla Perry is a 58 y.o. female with  history of multiple myeloma and no longer requiring use of port a cath for treatment (maintenance Revlimid only). She presents today for port a cath removal. Details/risks of procedure d/w pt/husband with their understanding and consent.          Signed: Autumn Messing 10/31/2014, 1:35 PM

## 2014-11-10 ENCOUNTER — Other Ambulatory Visit: Payer: Self-pay | Admitting: *Deleted

## 2014-11-10 NOTE — Telephone Encounter (Signed)
THIS REFILL REQUEST FOR REVLIMID WAS PLACED ON DR.GORSUCH'S DESK. 

## 2014-11-14 ENCOUNTER — Other Ambulatory Visit: Payer: Self-pay | Admitting: *Deleted

## 2014-11-14 DIAGNOSIS — C9000 Multiple myeloma not having achieved remission: Secondary | ICD-10-CM

## 2014-11-14 MED ORDER — LENALIDOMIDE 15 MG PO CAPS CTSU E1A11: ORAL_CAPSULE | ORAL | Status: DC

## 2014-11-15 ENCOUNTER — Other Ambulatory Visit: Payer: Self-pay | Admitting: Neurology

## 2014-11-16 ENCOUNTER — Encounter: Payer: Self-pay | Admitting: *Deleted

## 2014-11-16 NOTE — Telephone Encounter (Signed)
One refill auth via [facility] since Dr Janann Colonel left the practice

## 2014-11-21 ENCOUNTER — Ambulatory Visit (HOSPITAL_BASED_OUTPATIENT_CLINIC_OR_DEPARTMENT_OTHER): Payer: 59 | Admitting: Lab

## 2014-11-21 ENCOUNTER — Encounter: Payer: 59 | Admitting: *Deleted

## 2014-11-21 ENCOUNTER — Telehealth: Payer: Self-pay | Admitting: Hematology and Oncology

## 2014-11-21 DIAGNOSIS — E559 Vitamin D deficiency, unspecified: Secondary | ICD-10-CM

## 2014-11-21 DIAGNOSIS — C9000 Multiple myeloma not having achieved remission: Secondary | ICD-10-CM

## 2014-11-21 LAB — CBC WITH DIFFERENTIAL/PLATELET
BASO%: 0.4 % (ref 0.0–2.0)
Basophils Absolute: 0 10*3/uL (ref 0.0–0.1)
EOS%: 2.5 % (ref 0.0–7.0)
Eosinophils Absolute: 0.1 10*3/uL (ref 0.0–0.5)
HCT: 35.9 % (ref 34.8–46.6)
HGB: 11 g/dL — ABNORMAL LOW (ref 11.6–15.9)
LYMPH%: 54.3 % — ABNORMAL HIGH (ref 14.0–49.7)
MCH: 22.7 pg — ABNORMAL LOW (ref 25.1–34.0)
MCHC: 30.7 g/dL — ABNORMAL LOW (ref 31.5–36.0)
MCV: 74 fL — ABNORMAL LOW (ref 79.5–101.0)
MONO#: 0.4 10*3/uL (ref 0.1–0.9)
MONO%: 13.6 % (ref 0.0–14.0)
NEUT#: 0.9 10*3/uL — ABNORMAL LOW (ref 1.5–6.5)
NEUT%: 29.2 % — ABNORMAL LOW (ref 38.4–76.8)
Platelets: 263 10*3/uL (ref 145–400)
RBC: 4.86 10*6/uL (ref 3.70–5.45)
RDW: 14.8 % — ABNORMAL HIGH (ref 11.2–14.5)
WBC: 3.3 10*3/uL — ABNORMAL LOW (ref 3.9–10.3)
lymph#: 1.8 10*3/uL (ref 0.9–3.3)

## 2014-11-21 LAB — COMPREHENSIVE METABOLIC PANEL (CC13)
ALT: 40 U/L (ref 0–55)
AST: 23 U/L (ref 5–34)
Albumin: 3.6 g/dL (ref 3.5–5.0)
Alkaline Phosphatase: 57 U/L (ref 40–150)
Anion Gap: 9 mEq/L (ref 3–11)
BUN: 9.1 mg/dL (ref 7.0–26.0)
CO2: 27 mEq/L (ref 22–29)
Calcium: 9 mg/dL (ref 8.4–10.4)
Chloride: 105 mEq/L (ref 98–109)
Creatinine: 0.8 mg/dL (ref 0.6–1.1)
EGFR: >90 mL/min/{1.73_m2} (ref >90)
Glucose: 96 mg/dl (ref 70–140)
Potassium: 3.1 mEq/L — ABNORMAL LOW (ref 3.5–5.1)
Sodium: 141 mEq/L (ref 136–145)
Total Bilirubin: 0.73 mg/dL (ref 0.20–1.20)
Total Protein: 6.6 g/dL (ref 6.4–8.3)

## 2014-11-21 LAB — VITAMIN D 25 HYDROXY (VIT D DEFICIENCY, FRACTURES): Vit D, 25-Hydroxy: 59 ng/mL (ref 30–100)

## 2014-11-21 NOTE — Progress Notes (Signed)
11/21/2014 Patient in to clinic today for lab tests prior to Cycle 3 maintenance. Due to [place] of 900 (grade 3 neutropenia), treatment will be held for one week. Patient notified not to begin treatment today, but will return to clinic for repeat lab tests on 12/29. Patient voiced understanding. Patient reports continued bone pain and aches, but no worse than previous. Neuropathy symptoms are unchanged. She reports having a cold a couple of weeks ago. Patient has had diarrhea for the past couple of weeks, maximum 4 stools per day (grade 1), noted to be worse after she resumed milk intake. Dr. Alvy Bimler notified of above. [name] BSN, RN, CCRP 11/21/2014 9:46 AM

## 2014-11-21 NOTE — Telephone Encounter (Signed)
Lft msg for pt confirming labs per 12/22 POF...Marland KitchenMarland KitchenMarland Kitchen KJ

## 2014-11-23 ENCOUNTER — Other Ambulatory Visit: Payer: 59

## 2014-11-27 ENCOUNTER — Other Ambulatory Visit: Payer: Self-pay | Admitting: Hematology and Oncology

## 2014-11-27 ENCOUNTER — Encounter: Payer: Self-pay | Admitting: Hematology and Oncology

## 2014-11-27 DIAGNOSIS — D630 Anemia in neoplastic disease: Secondary | ICD-10-CM

## 2014-11-28 ENCOUNTER — Encounter: Payer: Self-pay | Admitting: Hematology and Oncology

## 2014-11-28 ENCOUNTER — Other Ambulatory Visit (HOSPITAL_BASED_OUTPATIENT_CLINIC_OR_DEPARTMENT_OTHER): Payer: 59

## 2014-11-28 ENCOUNTER — Encounter: Payer: 59 | Admitting: *Deleted

## 2014-11-28 ENCOUNTER — Telehealth: Payer: Self-pay | Admitting: Hematology and Oncology

## 2014-11-28 DIAGNOSIS — C9000 Multiple myeloma not having achieved remission: Secondary | ICD-10-CM

## 2014-11-28 DIAGNOSIS — D630 Anemia in neoplastic disease: Secondary | ICD-10-CM

## 2014-11-28 LAB — CBC WITH DIFFERENTIAL/PLATELET
BASO%: 0.8 % (ref 0.0–2.0)
Basophils Absolute: 0 10*3/uL (ref 0.0–0.1)
EOS%: 1.5 % (ref 0.0–7.0)
Eosinophils Absolute: 0.1 10*3/uL (ref 0.0–0.5)
HCT: 35.6 % (ref 34.8–46.6)
HGB: 10.8 g/dL — ABNORMAL LOW (ref 11.6–15.9)
LYMPH%: 47.3 % (ref 14.0–49.7)
MCH: 22.7 pg — ABNORMAL LOW (ref 25.1–34.0)
MCHC: 30.4 g/dL — ABNORMAL LOW (ref 31.5–36.0)
MCV: 74.5 fL — ABNORMAL LOW (ref 79.5–101.0)
MONO#: 0.5 10*3/uL (ref 0.1–0.9)
MONO%: 14.6 % — ABNORMAL HIGH (ref 0.0–14.0)
NEUT#: 1.2 10*3/uL — ABNORMAL LOW (ref 1.5–6.5)
NEUT%: 35.8 % — ABNORMAL LOW (ref 38.4–76.8)
Platelets: 226 10*3/uL (ref 145–400)
RBC: 4.78 10*6/uL (ref 3.70–5.45)
RDW: 15 % — ABNORMAL HIGH (ref 11.2–14.5)
WBC: 3.3 10*3/uL — ABNORMAL LOW (ref 3.9–10.3)
lymph#: 1.6 10*3/uL (ref 0.9–3.3)

## 2014-11-28 MED ORDER — LENALIDOMIDE 15 MG PO CAPS CTSU E1A11: ORAL_CAPSULE | ORAL | Status: DC

## 2014-11-28 NOTE — Progress Notes (Signed)
11/28/2014 Patient in to clinic today for evaluation prior to beginning maintenance cycle 3, following a one-week delay due to grade 3 neutropenia. Lab results meet criteria for re-treatment today, without need for dose modification (neutropenia not sustained for seven days). Based on lab results review by Dr. Alvy Bimler, patient will resume treatment today. January appointments will be rescheduled accordingly. Patient given an appointment calendar with scheduled Revlimid dates marked on it for patient's reference. Patient is in agreement with this plan. [name] BSN, RN, [place] 11/28/2014 9:23 AM  11/28/2014 Spoke with patient by phone who states that she is not currently taking aspirin. Per Dr. Alvy Bimler, patient was instructed to resume aspirin at 325mg  daily, beginning today. [name] BSN, RN, CCRP 11/28/2014 2:17 PM

## 2014-11-28 NOTE — Telephone Encounter (Signed)
Pt confirmed labs/ov per 12/28 POF, gave pt AVS.... KJ °

## 2014-11-29 NOTE — Addendum Note (Signed)
Addended by: Benson Norway on: 11/29/2014 03:53 PM   Modules accepted: Orders, Medications

## 2014-12-12 ENCOUNTER — Other Ambulatory Visit: Payer: 59

## 2014-12-12 ENCOUNTER — Other Ambulatory Visit: Payer: Self-pay | Admitting: *Deleted

## 2014-12-12 NOTE — Telephone Encounter (Signed)
THIS REFILL REQUEST FOR REVLIMID WAS PLACED ON DR.GORSUCH'S DESK. 

## 2014-12-13 ENCOUNTER — Other Ambulatory Visit: Payer: Self-pay | Admitting: *Deleted

## 2014-12-13 DIAGNOSIS — C9000 Multiple myeloma not having achieved remission: Secondary | ICD-10-CM

## 2014-12-13 MED ORDER — LENALIDOMIDE 15 MG PO CAPS CTSU E1A11: ORAL_CAPSULE | ORAL | Status: DC

## 2014-12-14 NOTE — Telephone Encounter (Signed)
RECEIVED A FAX FROM [employer] CONCERNING A CONFIRMATION OF FACSIMILE RECEIPT FOR PT. REFERRAL. 

## 2014-12-17 ENCOUNTER — Encounter: Payer: Self-pay | Admitting: Hematology and Oncology

## 2014-12-18 NOTE — Telephone Encounter (Signed)
DUE TO INSURANCE CHANGE FOR 2016 PT.'S REVLIMID WILL BE FILLED BY [facility].

## 2014-12-19 ENCOUNTER — Other Ambulatory Visit: Payer: 59

## 2014-12-19 ENCOUNTER — Ambulatory Visit: Payer: 59 | Admitting: Hematology and Oncology

## 2014-12-19 ENCOUNTER — Encounter: Payer: Self-pay | Admitting: Hematology and Oncology

## 2014-12-19 ENCOUNTER — Other Ambulatory Visit (HOSPITAL_BASED_OUTPATIENT_CLINIC_OR_DEPARTMENT_OTHER): Payer: 59

## 2014-12-19 DIAGNOSIS — R946 Abnormal results of thyroid function studies: Secondary | ICD-10-CM

## 2014-12-19 DIAGNOSIS — C9000 Multiple myeloma not having achieved remission: Secondary | ICD-10-CM

## 2014-12-19 LAB — CBC WITH DIFFERENTIAL/PLATELET
BASO%: 0.7 % (ref 0.0–2.0)
Basophils Absolute: 0 10*3/uL (ref 0.0–0.1)
EOS%: 5.9 % (ref 0.0–7.0)
Eosinophils Absolute: 0.2 10*3/uL (ref 0.0–0.5)
HCT: 37.9 % (ref 34.8–46.6)
HGB: 11.4 g/dL — ABNORMAL LOW (ref 11.6–15.9)
LYMPH%: 43.6 % (ref 14.0–49.7)
MCH: 22.3 pg — ABNORMAL LOW (ref 25.1–34.0)
MCHC: 30.1 g/dL — ABNORMAL LOW (ref 31.5–36.0)
MCV: 74.1 fL — ABNORMAL LOW (ref 79.5–101.0)
MONO#: 0.3 10*3/uL (ref 0.1–0.9)
MONO%: 12.5 % (ref 0.0–14.0)
NEUT#: 1 10*3/uL — ABNORMAL LOW (ref 1.5–6.5)
NEUT%: 37.3 % — ABNORMAL LOW (ref 38.4–76.8)
Platelets: 221 10*3/uL (ref 145–400)
RBC: 5.11 10*6/uL (ref 3.70–5.45)
RDW: 15.7 % — ABNORMAL HIGH (ref 11.2–14.5)
WBC: 2.7 10*3/uL — ABNORMAL LOW (ref 3.9–10.3)
lymph#: 1.2 10*3/uL (ref 0.9–3.3)

## 2014-12-19 LAB — COMPREHENSIVE METABOLIC PANEL (CC13)
ALT: 52 U/L (ref 0–55)
AST: 26 U/L (ref 5–34)
Albumin: 3.7 g/dL (ref 3.5–5.0)
Alkaline Phosphatase: 65 U/L (ref 40–150)
Anion Gap: 8 mEq/L (ref 3–11)
BUN: 7.4 mg/dL (ref 7.0–26.0)
CO2: 29 mEq/L (ref 22–29)
Calcium: 9.2 mg/dL (ref 8.4–10.4)
Chloride: 104 mEq/L (ref 98–109)
Creatinine: 0.8 mg/dL (ref 0.6–1.1)
EGFR: >90 mL/min/{1.73_m2} (ref >90)
Glucose: 86 mg/dl (ref 70–140)
Potassium: 3.4 mEq/L — ABNORMAL LOW (ref 3.5–5.1)
Sodium: 141 mEq/L (ref 136–145)
Total Bilirubin: 0.76 mg/dL (ref 0.20–1.20)
Total Protein: 7.1 g/dL (ref 6.4–8.3)

## 2014-12-19 LAB — LACTATE DEHYDROGENASE (CC13): LDH: 158 U/L (ref 125–245)

## 2014-12-19 LAB — TSH CHCC: TSH: 0.364 m(IU)/L (ref 0.308–3.960)

## 2014-12-19 LAB — TECHNOLOGIST REVIEW

## 2014-12-19 NOTE — Progress Notes (Signed)
Put disability form on nurse's desk. °

## 2014-12-21 ENCOUNTER — Telehealth: Payer: Self-pay | Admitting: Hematology and Oncology

## 2014-12-21 ENCOUNTER — Encounter: Payer: Self-pay | Admitting: Hematology and Oncology

## 2014-12-21 ENCOUNTER — Telehealth: Payer: Self-pay | Admitting: *Deleted

## 2014-12-21 ENCOUNTER — Other Ambulatory Visit: Payer: Self-pay | Admitting: Neurology

## 2014-12-21 ENCOUNTER — Other Ambulatory Visit: Payer: Self-pay | Admitting: *Deleted

## 2014-12-21 DIAGNOSIS — C9000 Multiple myeloma not having achieved remission: Secondary | ICD-10-CM

## 2014-12-21 LAB — SPEP & IFE WITH QIG
Albumin ELP: 53.9 % — ABNORMAL LOW (ref 55.8–66.1)
Alpha-1-Globulin: 4.1 % (ref 2.9–4.9)
Alpha-2-Globulin: 9.2 % (ref 7.1–11.8)
Beta 2: 5.5 % (ref 3.2–6.5)
Beta Globulin: 6.5 % (ref 4.7–7.2)
Gamma Globulin: 20.8 % — ABNORMAL HIGH (ref 11.1–18.8)
IgA: 120 mg/dL (ref 69–380)
IgG (Immunoglobin G), Serum: 1330 mg/dL (ref 690–1700)
IgM, Serum: 51 mg/dL — ABNORMAL LOW (ref 52–322)
M-Spike, %: 0.5 g/dL
Total Protein, Serum Electrophoresis: 6.9 g/dL (ref 6.0–8.3)

## 2014-12-21 LAB — KAPPA/LAMBDA LIGHT CHAINS
Kappa free light chain: 2.94 mg/dL — ABNORMAL HIGH (ref 0.33–1.94)
Kappa:Lambda Ratio: 1.33 (ref 0.26–1.65)
Lambda Free Lght Chn: 2.21 mg/dL (ref 0.57–2.63)

## 2014-12-21 NOTE — Progress Notes (Signed)
[facility], [id number], approved revlimid from 12/21/13-12/21/14

## 2014-12-21 NOTE — Telephone Encounter (Signed)
Call received reporting message left earlier this week for prior authorization of Revlimid due to patient insurance change.  Call transferred to [facility] in [facility].

## 2014-12-21 NOTE — Telephone Encounter (Signed)
S/w pharmacist at [facility].  She states Revlimid will be delivered to pt on Monday 1/25.

## 2014-12-21 NOTE — Progress Notes (Signed)
CORRECTED AUTH DATES::::[facility], [id number], approved revlimid from 12/21/14-12/22/15

## 2014-12-21 NOTE — Telephone Encounter (Signed)
s.w. pt and advised on added lab on 1.26.Marland KitchenMarland KitchenMarland KitchenMarland Kitchenpt ok and aware

## 2014-12-26 ENCOUNTER — Encounter: Payer: Self-pay | Admitting: *Deleted

## 2014-12-26 ENCOUNTER — Encounter: Payer: Self-pay | Admitting: Hematology and Oncology

## 2014-12-26 ENCOUNTER — Other Ambulatory Visit (HOSPITAL_BASED_OUTPATIENT_CLINIC_OR_DEPARTMENT_OTHER): Payer: 59

## 2014-12-26 ENCOUNTER — Ambulatory Visit (HOSPITAL_BASED_OUTPATIENT_CLINIC_OR_DEPARTMENT_OTHER): Payer: 59 | Admitting: Hematology and Oncology

## 2014-12-26 ENCOUNTER — Telehealth: Payer: Self-pay | Admitting: Hematology and Oncology

## 2014-12-26 VITALS — BP 142/67 | HR 63 | Temp 98.2°F | Resp 18 | Ht 67.0 in | Wt 173.5 lb

## 2014-12-26 DIAGNOSIS — G893 Neoplasm related pain (acute) (chronic): Secondary | ICD-10-CM | POA: Insufficient documentation

## 2014-12-26 DIAGNOSIS — D72818 Other decreased white blood cell count: Secondary | ICD-10-CM

## 2014-12-26 DIAGNOSIS — R197 Diarrhea, unspecified: Secondary | ICD-10-CM

## 2014-12-26 DIAGNOSIS — D701 Agranulocytosis secondary to cancer chemotherapy: Secondary | ICD-10-CM

## 2014-12-26 DIAGNOSIS — D630 Anemia in neoplastic disease: Secondary | ICD-10-CM

## 2014-12-26 DIAGNOSIS — T451X5A Adverse effect of antineoplastic and immunosuppressive drugs, initial encounter: Secondary | ICD-10-CM

## 2014-12-26 DIAGNOSIS — C9000 Multiple myeloma not having achieved remission: Secondary | ICD-10-CM

## 2014-12-26 DIAGNOSIS — M898X9 Other specified disorders of bone, unspecified site: Secondary | ICD-10-CM

## 2014-12-26 LAB — CBC WITH DIFFERENTIAL/PLATELET
BASO%: 0.3 % (ref 0.0–2.0)
Basophils Absolute: 0 10*3/uL (ref 0.0–0.1)
EOS%: 1.8 % (ref 0.0–7.0)
Eosinophils Absolute: 0.1 10*3/uL (ref 0.0–0.5)
HCT: 34.6 % — ABNORMAL LOW (ref 34.8–46.6)
HGB: 10.8 g/dL — ABNORMAL LOW (ref 11.6–15.9)
LYMPH%: 61.3 % — ABNORMAL HIGH (ref 14.0–49.7)
MCH: 23 pg — ABNORMAL LOW (ref 25.1–34.0)
MCHC: 31.2 g/dL — ABNORMAL LOW (ref 31.5–36.0)
MCV: 73.6 fL — ABNORMAL LOW (ref 79.5–101.0)
MONO#: 0.4 10*3/uL (ref 0.1–0.9)
MONO%: 12.3 % (ref 0.0–14.0)
NEUT#: 0.8 10*3/uL — ABNORMAL LOW (ref 1.5–6.5)
NEUT%: 24.3 % — ABNORMAL LOW (ref 38.4–76.8)
Platelets: 227 10*3/uL (ref 145–400)
RBC: 4.7 10*6/uL (ref 3.70–5.45)
RDW: 15.3 % — ABNORMAL HIGH (ref 11.2–14.5)
WBC: 3.3 10*3/uL — ABNORMAL LOW (ref 3.9–10.3)
lymph#: 2 10*3/uL (ref 0.9–3.3)

## 2014-12-26 NOTE — Assessment & Plan Note (Signed)
This is likely due to recent treatment. The patient denies recent history of bleeding such as epistaxis, hematuria or hematochezia. She is asymptomatic from the anemia. I will observe for now.   

## 2014-12-26 NOTE — Progress Notes (Signed)
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   05/05/2014 Imaging Imaging study of the neck showed no explanation that could cause right neck pain. She is noted to have incidental left upper lung nodule. Plan to repeat imaging study in 3 months.   09/06/2014 Imaging Bone survey showed no evidence of fracture   09/14/2014 Bone Marrow Biopsy Bone marrow biopsy showed 8% residual plasma cells by manual count but none on the biopsy specimen   09/26/2014 -  Chemotherapy She is started on cycle 1 of maintenance Revlimid    INTERVAL HISTORY: Please see below for problem oriented charting.  she is seen today prior to her start of new cycle of treatment. She has change in bowel habits with diarrhea alternates with constipation. She has generalized bone pain. The patient denies any recent signs or symptoms of bleeding such as spontaneous epistaxis, hematuria or hematochezia.   REVIEW OF SYSTEMS:   Constitutional: Denies fevers, chills or abnormal weight loss Eyes: Denies blurriness of vision Ears, nose, mouth, throat, and face: Denies mucositis or sore throat Respiratory: Denies cough, dyspnea or  wheezes Cardiovascular: Denies palpitation, chest discomfort or lower extremity swelling Skin: Denies abnormal skin rashes Lymphatics: Denies new lymphadenopathy or easy bruising Neurological:Denies numbness, tingling or new weaknesses Behavioral/Psych: Mood is stable, no new changes  All other systems were reviewed with the patient and are negative.  I have reviewed the past medical history, past surgical history, social history and family history with the patient and they are unchanged from previous note.  ALLERGIES:  has No Known Allergies.  MEDICATIONS:  Current Outpatient Prescriptions  Medication Sig Dispense Refill  . acyclovir (ZOVIRAX) 400 MG tablet Take 1 tablet (400 mg total) by mouth daily. Per Dr. Alvy Bimler, continue for one year post-Velcade. 90 tablet 3  . aspirin 325 MG tablet Take 325 mg by mouth daily.    . calcium carbonate (TUMS - DOSED IN MG ELEMENTAL CALCIUM) 500 MG chewable tablet Chew 1 tablet by mouth as needed for indigestion or heartburn.    . carboxymethylcellulose (REFRESH PLUS) 0.5 % SOLN Place 1 drop into both eyes daily as needed (for dry eyes).     . Cholecalciferol (VITAMIN D3) 2000 UNITS TABS Take 2,000 Units by mouth daily.    . hydrochlorothiazide (HYDRODIURIL) 25 MG tablet Take 25 mg by mouth daily with breakfast.     . lenalidomide (REVLIMID) 15 MG capsule Take 1 capsule daily on days 1-21. Repeat every 28 days. 21 capsule 0  . levothyroxine (SYNTHROID, LEVOTHROID) 75 MCG tablet Take 75 mcg by mouth daily before breakfast.    . lidocaine-prilocaine (EMLA) cream Apply 1 application topically as needed (port).    . metFORMIN (GLUCOPHAGE) 500 MG tablet Take 500 mg by mouth 2 (two) times  daily with a meal.    . Multiple Vitamins-Minerals (CENTRUM SILVER PO) Take 1 tablet by mouth daily.     . Oxycodone HCl 10 MG TABS Take 5-10 mg by mouth every 4 (four) hours as needed (for pain).    . polyethylene glycol (MIRALAX / GLYCOLAX) packet Take 17 g by mouth  daily.    . traMADol (ULTRAM) 50 MG tablet Take 1 tablet (50 mg total) by mouth every 6 (six) hours as needed for moderate pain. 90 tablet 0  . venlafaxine XR (EFFEXOR-XR) 37.5 MG 24 hr capsule Take 37.5 mg by mouth daily with breakfast.    . verapamil (CALAN-SR) 120 MG CR tablet Take 120 mg by mouth every morning.    . verapamil (VERELAN PM) 120 MG 24 hr capsule Take 1 capsule by mouth  daily 15 capsule 0   No current facility-administered medications for this visit.    PHYSICAL EXAMINATION: ECOG PERFORMANCE STATUS: 1 - Symptomatic but completely ambulatory  Filed Vitals:   12/26/14 1210  BP: 142/67  Pulse: 63  Temp: 98.2 F (36.8 C)  Resp: 18   Filed Weights   12/26/14 1210  Weight: 173 lb 8 oz (78.699 kg)    GENERAL:alert, no distress and comfortable SKIN: skin color, texture, turgor are normal, no rashes or significant lesions EYES: normal, Conjunctiva are pink and non-injected, sclera clear OROPHARYNX:no exudate, no erythema and lips, buccal mucosa, and tongue normal  NECK: supple, thyroid normal size, non-tender, without nodularity LYMPH:  no palpable lymphadenopathy in the cervical, axillary or inguinal LUNGS: clear to auscultation and percussion with normal breathing effort HEART: regular rate & rhythm and no murmurs and no lower extremity edema ABDOMEN:abdomen soft, non-tender and normal bowel sounds Musculoskeletal:no cyanosis of digits and no clubbing  NEURO: alert & oriented x 3 with fluent speech, no focal motor/sensory deficits  LABORATORY DATA:  I have reviewed the data as listed    Component Value Date/Time   NA 141 12/19/2014 0844   K 3.4* 12/19/2014 0844   CO2 29 12/19/2014 0844   GLUCOSE 86 12/19/2014 0844   BUN 7.4 12/19/2014 0844   CREATININE 0.8 12/19/2014 0844   CALCIUM 9.2 12/19/2014 0844   PROT 7.1 12/19/2014 0844   ALBUMIN 3.7 12/19/2014 0844   AST 26 12/19/2014 0844   ALT 52 12/19/2014 0844   ALKPHOS 65 12/19/2014 0844   BILITOT 0.76  12/19/2014 0844    No results found for: SPEP, UPEP  Lab Results  Component Value Date   WBC 3.3* 12/26/2014   NEUTROABS 0.8* 12/26/2014   HGB 10.8* 12/26/2014   HCT 34.6* 12/26/2014   MCV 73.6* 12/26/2014   PLT 227 12/26/2014      Chemistry      Component Value Date/Time   NA 141 12/19/2014 0844   K 3.4* 12/19/2014 0844   CO2 29 12/19/2014 0844   BUN 7.4 12/19/2014 0844   CREATININE 0.8 12/19/2014 0844      Component Value Date/Time   CALCIUM 9.2 12/19/2014 0844   ALKPHOS 65 12/19/2014 0844   AST 26 12/19/2014 0844   ALT 52 12/19/2014 0844   BILITOT 0.76 12/19/2014 0844      ASSESSMENT & PLAN:  Multiple myeloma without remission She tolerated Revlimid well without side-effects.  unfortunately, she has prolonged neutropenia. Her treatment is going to be placed on hold for another week.      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 The patient denies recent history of  bleeding such as epistaxis, hematuria or hematochezia. She is asymptomatic from the anemia. I will observe for now.    Leukopenia due to antineoplastic chemotherapy This is likely due to recent treatment. The patient denies recent history of fevers, cough, chills, diarrhea or dysuria. She is asymptomatic from the leukopenia. I will observe for now.     Diarrhea  She has intermittent diarrhea alternate with constipation. I told her that her side effects is unrelated to Revlimid. She needs GI evaluation and she will discuss this with her primary care provider.   Malignant bone pain She continues to have intermittent, diffuse bone pain which I suspect is related to her underlying disease. She will continue to take tramadol as needed.    Orders Placed This Encounter  Procedures  . SPEP & IFE with QIG    Standing Status: Future     Number of Occurrences:      Standing Expiration Date: 01/30/2016  . Kappa/lambda light chains    Standing Status: Future     Number of  Occurrences:      Standing Expiration Date: 01/30/2016  . Beta 2 microglobulin, serum    Standing Status: Future     Number of Occurrences:      Standing Expiration Date: 01/30/2016   All questions were answered. The patient knows to call the clinic with any problems, questions or concerns. No barriers to learning was detected. I spent 25 minutes counseling the patient face to face. The total time spent in the appointment was 30 minutes and more than 50% was on counseling and review of test results     [facility], [place], MD 12/26/2014 1:21 PM

## 2014-12-26 NOTE — Assessment & Plan Note (Signed)
This is likely due to recent treatment. The patient denies recent history of fevers, cough, chills, diarrhea or dysuria. She is asymptomatic from the leukopenia. I will observe for now.    

## 2014-12-26 NOTE — Progress Notes (Signed)
12/26/2014 1200  Patient in to clinic today for lab tests and MD visit prior to Cycle 4 maintenance. Patient complains of "stomach problems" and states she is going to her primary care tomorrow to get a referral to GI.  She reports  that on 12/08/14 she passed out on the toilet after having a BM from constipation that lasted 4-5 days. Patient has had intermittent diarrhea for the past couple of weeks, (grade 1). Patient reports continued bone pain in ankles, knees, back, and wrist.  She continues to take her pain medication for it.  She reports her neuropathy and muscle weakness are the same.  Patient seen by Dr. Alvy Bimler who is aware of above. Per protocol and MD, due to [place] of 800 (grade 3 neutropenia), treatment will be held.  Patient was informed not to begin treatment today, but will return to clinic for repeat lab tests on 01/02/15. Patient was reminded of precautions to take for prevention of infection as well as signs and symptoms of infection to report immediatly.  Patient voiced understanding.

## 2014-12-26 NOTE — Telephone Encounter (Signed)
Gave avs & calendar for February/March.  °

## 2014-12-26 NOTE — Assessment & Plan Note (Signed)
She continues to have intermittent, diffuse bone pain which I suspect is related to her underlying disease. She will continue to take tramadol as needed.

## 2014-12-26 NOTE — Assessment & Plan Note (Signed)
She tolerated Revlimid well without side-effects.  unfortunately, she has prolonged neutropenia. Her treatment is going to be placed on hold for another week.

## 2014-12-26 NOTE — Assessment & Plan Note (Signed)
She has intermittent diarrhea alternate with constipation. I told her that her side effects is unrelated to Revlimid. She needs GI evaluation and she will discuss this with her primary care provider.

## 2014-12-29 ENCOUNTER — Telehealth: Payer: Self-pay | Admitting: Neurology

## 2014-12-29 NOTE — Telephone Encounter (Signed)
I called back.  They were able to locate Verapamil 120mg  in the system and will process order.  Nothing further is needed at this time.

## 2014-12-29 NOTE — Telephone Encounter (Signed)
John with [facility] @ [phone], option 1, ref # [id number], stated Rx verapamil (VERELAN PM) 120 MG 24 hr capsule, only comes in 100 mg, 200 mg, and 300 mg capsules.  Please call and advise.

## 2015-01-02 ENCOUNTER — Encounter: Payer: Self-pay | Admitting: *Deleted

## 2015-01-02 ENCOUNTER — Other Ambulatory Visit (HOSPITAL_BASED_OUTPATIENT_CLINIC_OR_DEPARTMENT_OTHER): Payer: 59

## 2015-01-02 ENCOUNTER — Telehealth: Payer: Self-pay | Admitting: Hematology and Oncology

## 2015-01-02 DIAGNOSIS — C9000 Multiple myeloma not having achieved remission: Secondary | ICD-10-CM

## 2015-01-02 LAB — CBC WITH DIFFERENTIAL/PLATELET
BASO%: 0.4 % (ref 0.0–2.0)
Basophils Absolute: 0 10*3/uL (ref 0.0–0.1)
EOS%: 2.5 % (ref 0.0–7.0)
Eosinophils Absolute: 0.1 10*3/uL (ref 0.0–0.5)
HCT: 37 % (ref 34.8–46.6)
HGB: 11.7 g/dL (ref 11.6–15.9)
LYMPH%: 58.1 % — ABNORMAL HIGH (ref 14.0–49.7)
MCH: 23.4 pg — ABNORMAL LOW (ref 25.1–34.0)
MCHC: 31.6 g/dL (ref 31.5–36.0)
MCV: 73.9 fL — ABNORMAL LOW (ref 79.5–101.0)
MONO#: 0.4 10*3/uL (ref 0.1–0.9)
MONO%: 13 % (ref 0.0–14.0)
NEUT#: 0.7 10*3/uL — ABNORMAL LOW (ref 1.5–6.5)
NEUT%: 26 % — ABNORMAL LOW (ref 38.4–76.8)
Platelets: 281 10*3/uL (ref 145–400)
RBC: 5.01 10*6/uL (ref 3.70–5.45)
RDW: 15.9 % — ABNORMAL HIGH (ref 11.2–14.5)
WBC: 2.8 10*3/uL — ABNORMAL LOW (ref 3.9–10.3)
lymph#: 1.7 10*3/uL (ref 0.9–3.3)
nRBC: 0 % (ref 0–0)

## 2015-01-02 LAB — COMPREHENSIVE METABOLIC PANEL (CC13)
ALT: 33 U/L (ref 0–55)
AST: 20 U/L (ref 5–34)
Albumin: 3.7 g/dL (ref 3.5–5.0)
Alkaline Phosphatase: 72 U/L (ref 40–150)
Anion Gap: 8 mEq/L (ref 3–11)
BUN: 10.7 mg/dL (ref 7.0–26.0)
CO2: 26 mEq/L (ref 22–29)
Calcium: 9.4 mg/dL (ref 8.4–10.4)
Chloride: 106 mEq/L (ref 98–109)
Creatinine: 0.8 mg/dL (ref 0.6–1.1)
EGFR: 90 mL/min/{1.73_m2} — ABNORMAL LOW (ref >90)
Glucose: 99 mg/dl (ref 70–140)
Potassium: 4 mEq/L (ref 3.5–5.1)
Sodium: 139 mEq/L (ref 136–145)
Total Bilirubin: 0.48 mg/dL (ref 0.20–1.20)
Total Protein: 7.3 g/dL (ref 6.4–8.3)

## 2015-01-02 LAB — LACTATE DEHYDROGENASE (CC13): LDH: 170 U/L (ref 125–245)

## 2015-01-02 NOTE — Progress Notes (Signed)
01/02/2015 MD notified of [place] too low for treatment today. Due to MD out of office next week, request made to have patient return to clinic on 01/17/15 to evaluate for re-treatment. Patient notified that treatment will not begin today due to low counts, with anticipated return in two weeks. Will contact study chair to see if delay beyond 14 days between cycles will be allowed. [name] BSN, RN, [place] 01/02/2015 10:27 AM

## 2015-01-02 NOTE — Progress Notes (Signed)
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 Patient reports that she did follow up with her PCP, Dr. Lysle Rubens, regarding GI complaints of abdominal pain and diarrhea. Patient reports that Dr. Lysle Rubens felt symptoms could be related to Metformin. Accordingly, this drug was discontinued on 12/27/14 and Januvia was started in its place. Patient feels like diarrhea has resolved, although she continues to have stomach discomfort, often associated with taking pain medication. Per patient, she will have a 3-week follow-up visit with Dr. Lysle Rubens, before he decides if a GI consult is warranted. Patient was instructed not to resume lenalidamide until instructed to do so by our office. Patient voiced understanding of this plan. [name] BSN, RN, [place] 01/02/2015 8:07 AM

## 2015-01-02 NOTE — Telephone Encounter (Signed)
Patient confirmed appointment for 02/17

## 2015-01-04 LAB — SPEP & IFE WITH QIG
Albumin ELP: 54.9 % — ABNORMAL LOW (ref 55.8–66.1)
Alpha-1-Globulin: 4 % (ref 2.9–4.9)
Alpha-2-Globulin: 8.7 % (ref 7.1–11.8)
Beta 2: 5.3 % (ref 3.2–6.5)
Beta Globulin: 6.3 % (ref 4.7–7.2)
Gamma Globulin: 20.8 % — ABNORMAL HIGH (ref 11.1–18.8)
IgA: 125 mg/dL (ref 69–380)
IgG (Immunoglobin G), Serum: 1550 mg/dL (ref 690–1700)
IgM, Serum: 49 mg/dL — ABNORMAL LOW (ref 52–322)
M-Spike, %: 0.52 g/dL
Total Protein, Serum Electrophoresis: 7.3 g/dL (ref 6.0–8.3)

## 2015-01-04 LAB — BETA 2 MICROGLOBULIN, SERUM: Beta-2 Microglobulin: 2 mg/L (ref <=2.51)

## 2015-01-04 LAB — KAPPA/LAMBDA LIGHT CHAINS
Kappa free light chain: 1.82 mg/dL (ref 0.33–1.94)
Kappa:Lambda Ratio: 0.88 (ref 0.26–1.65)
Lambda Free Lght Chn: 2.07 mg/dL (ref 0.57–2.63)

## 2015-01-15 ENCOUNTER — Other Ambulatory Visit: Payer: Self-pay

## 2015-01-15 DIAGNOSIS — C9000 Multiple myeloma not having achieved remission: Secondary | ICD-10-CM

## 2015-01-15 MED ORDER — LENALIDOMIDE 10 MG PO CAPS CTSU E1A11: 10.0000 mg | ORAL_CAPSULE | Freq: Every day | ORAL | Status: DC

## 2015-01-15 NOTE — Telephone Encounter (Signed)
Called pt to inform to fill out survery for her Revlimid. Rx faxed to [facility] for revlimid 10mg .

## 2015-01-16 ENCOUNTER — Telehealth: Payer: Self-pay | Admitting: *Deleted

## 2015-01-16 NOTE — Telephone Encounter (Signed)
CHECK WITH [facility]

## 2015-01-16 NOTE — Telephone Encounter (Signed)
Faxed revlimid rx to [facility].

## 2015-01-17 ENCOUNTER — Telehealth: Payer: Self-pay | Admitting: *Deleted

## 2015-01-17 ENCOUNTER — Telehealth: Payer: Self-pay | Admitting: Hematology and Oncology

## 2015-01-17 ENCOUNTER — Other Ambulatory Visit (HOSPITAL_BASED_OUTPATIENT_CLINIC_OR_DEPARTMENT_OTHER): Payer: 59

## 2015-01-17 ENCOUNTER — Encounter: Payer: Self-pay | Admitting: Hematology and Oncology

## 2015-01-17 ENCOUNTER — Ambulatory Visit (HOSPITAL_BASED_OUTPATIENT_CLINIC_OR_DEPARTMENT_OTHER): Payer: 59 | Admitting: Hematology and Oncology

## 2015-01-17 ENCOUNTER — Encounter: Payer: 59 | Admitting: *Deleted

## 2015-01-17 VITALS — BP 134/87 | HR 75 | Temp 98.1°F | Resp 18 | Ht 67.0 in | Wt 172.0 lb

## 2015-01-17 DIAGNOSIS — M898X9 Other specified disorders of bone, unspecified site: Secondary | ICD-10-CM

## 2015-01-17 DIAGNOSIS — D701 Agranulocytosis secondary to cancer chemotherapy: Secondary | ICD-10-CM

## 2015-01-17 DIAGNOSIS — M899 Disorder of bone, unspecified: Secondary | ICD-10-CM

## 2015-01-17 DIAGNOSIS — R946 Abnormal results of thyroid function studies: Secondary | ICD-10-CM

## 2015-01-17 DIAGNOSIS — D72819 Decreased white blood cell count, unspecified: Secondary | ICD-10-CM

## 2015-01-17 DIAGNOSIS — Z006 Encounter for examination for normal comparison and control in clinical research program: Secondary | ICD-10-CM

## 2015-01-17 DIAGNOSIS — C9000 Multiple myeloma not having achieved remission: Secondary | ICD-10-CM

## 2015-01-17 DIAGNOSIS — R197 Diarrhea, unspecified: Secondary | ICD-10-CM

## 2015-01-17 DIAGNOSIS — T451X5A Adverse effect of antineoplastic and immunosuppressive drugs, initial encounter: Secondary | ICD-10-CM

## 2015-01-17 LAB — COMPREHENSIVE METABOLIC PANEL (CC13)
ALT: 28 U/L (ref 0–55)
AST: 21 U/L (ref 5–34)
Albumin: 3.6 g/dL (ref 3.5–5.0)
Alkaline Phosphatase: 66 U/L (ref 40–150)
Anion Gap: 8 mEq/L (ref 3–11)
BUN: 14.7 mg/dL (ref 7.0–26.0)
CO2: 26 mEq/L (ref 22–29)
Calcium: 9.5 mg/dL (ref 8.4–10.4)
Chloride: 105 mEq/L (ref 98–109)
Creatinine: 0.9 mg/dL (ref 0.6–1.1)
EGFR: 86 mL/min/{1.73_m2} — ABNORMAL LOW (ref >90)
Glucose: 110 mg/dl (ref 70–140)
Potassium: 3.6 mEq/L (ref 3.5–5.1)
Sodium: 140 mEq/L (ref 136–145)
Total Bilirubin: 0.41 mg/dL (ref 0.20–1.20)
Total Protein: 7.1 g/dL (ref 6.4–8.3)

## 2015-01-17 LAB — CBC WITH DIFFERENTIAL/PLATELET
BASO%: 0.3 % (ref 0.0–2.0)
Basophils Absolute: 0 10*3/uL (ref 0.0–0.1)
EOS%: 2.2 % (ref 0.0–7.0)
Eosinophils Absolute: 0.1 10*3/uL (ref 0.0–0.5)
HCT: 36.7 % (ref 34.8–46.6)
HGB: 11.6 g/dL (ref 11.6–15.9)
LYMPH%: 43.6 % (ref 14.0–49.7)
MCH: 23.2 pg — ABNORMAL LOW (ref 25.1–34.0)
MCHC: 31.6 g/dL (ref 31.5–36.0)
MCV: 73.5 fL — ABNORMAL LOW (ref 79.5–101.0)
MONO#: 0.3 10*3/uL (ref 0.1–0.9)
MONO%: 9.9 % (ref 0.0–14.0)
NEUT#: 1.4 10*3/uL — ABNORMAL LOW (ref 1.5–6.5)
NEUT%: 44 % (ref 38.4–76.8)
Platelets: 240 10*3/uL (ref 145–400)
RBC: 4.99 10*6/uL (ref 3.70–5.45)
RDW: 15.8 % — ABNORMAL HIGH (ref 11.2–14.5)
WBC: 3.1 10*3/uL — ABNORMAL LOW (ref 3.9–10.3)
lymph#: 1.4 10*3/uL (ref 0.9–3.3)
nRBC: 1 % — ABNORMAL HIGH (ref 0–0)

## 2015-01-17 LAB — LACTATE DEHYDROGENASE (CC13): LDH: 172 U/L (ref 125–245)

## 2015-01-17 NOTE — Telephone Encounter (Signed)
Called [facility] [phone] and gave them updated Authorization [id number] (revlimid rx was faxed w/ old auth number).   S/w Ebony Hail at [facility] and they will contact pt to arrange delivery of Revlimid.

## 2015-01-17 NOTE — Progress Notes (Signed)
01/17/2015 Patient in to clinic this morning accompanied by her husband. Based on lab results review and history and physical by Dr. Alvy Bimler, patient meets criteria for continued treatment at this time. Updated side effects occuring over the past 3 weeks, following cycle 4 delay, were documented following a review with the patient. Per study chair correspondence dated 01/02/2015, permission for delay in treatment cycle start of greater than 3 weeks has been granted. Dose will be reduced to Dose level -1 (10mg  daily for 21 days every 28 days) due to sustained grade 3 neutropenia, as outlined in section 5.4.8. Due to change in Revlimid dose and delays from specialty pharmacy, including change to new pharmacy and new authorization required, medication is expected to be delivered on 01/18/2015. Patient was given a printed appointment calendar showing anticipated start date of today, however, due to delay in medication, calendar will be updated to reflect the correct treatment start day of 2/18 (if medication arrives in time for dosing on 2/18) or 2/19 if any other delays are encountered.  [name] BSN, RN, [place] 01/17/2015 3:24 PM

## 2015-01-17 NOTE — Assessment & Plan Note (Signed)
She has intermittent diarrhea alternate with constipation. I told her that her side effects is unrelated to Revlimid. She needs GI evaluation and she will discuss this with her primary care provider.  I also recommend dietary modification. She has been taking Imodium as needed.

## 2015-01-17 NOTE — Assessment & Plan Note (Signed)
The thyroid function test  Had normalized last month. We will recheck it again in 3 months.

## 2015-01-17 NOTE — Assessment & Plan Note (Addendum)
This is unlikely due to her disease. I recommend her to continue taking oxycodone or tramadol as needed.

## 2015-01-17 NOTE — Assessment & Plan Note (Signed)
This is likely due to recent treatment. The patient denies recent history of fevers, cough, chills, diarrhea or dysuria. She is asymptomatic from the leukopenia. I will observe for now.    

## 2015-01-17 NOTE — Assessment & Plan Note (Addendum)
She tolerated Revlimid well without significant side-effects.  unfortunately, she has prolonged neutropenia.  per protocol, we will reduce the dose of Revlimid. We'll see her back within the month. If she tolerate a new dose well, we can space out her appointment in the future. 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

## 2015-01-17 NOTE — Telephone Encounter (Signed)
gv and printed appt sched anda vs for pt for March.... °

## 2015-01-17 NOTE — Progress Notes (Signed)
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 Patient particpated in clinical research [facility] [phone]   02/23/2014 Bone Marrow Biopsy Repeat bone marrow biopsy showed 5% involvement   03/31/2014 Adverse Reaction Zometa was discontinued due to osteonecrosis of the jaw.   05/05/2014 Imaging Imaging study of the neck showed no explanation that could cause right neck pain. She is noted to have incidental left upper lung nodule. Plan to repeat imaging study in 3 months.   09/06/2014 Imaging Bone survey showed no evidence of fracture   09/14/2014 Bone Marrow Biopsy Bone marrow biopsy showed 8% residual plasma cells by manual count but none on the biopsy specimen   09/26/2014 -  Chemotherapy She is started on cycle 1 of maintenance Revlimid    INTERVAL HISTORY: Please see below for problem oriented charting.  she is seen for further follow-up. She denies recent infection. She continues to have intermittent bone pain and mild diarrhea. Diarrhea has improved with the addition of Imodium.  she complained of fatigue.  REVIEW OF SYSTEMS:   Constitutional: Denies fevers, chills or abnormal weight loss Eyes: Denies blurriness of vision Ears, nose, mouth, throat, and face: Denies mucositis or sore throat Respiratory: Denies cough, dyspnea or wheezes Cardiovascular: Denies palpitation, chest discomfort or lower extremity swelling Skin:  Denies abnormal skin rashes Lymphatics: Denies new lymphadenopathy or easy bruising Neurological:Denies numbness, tingling or new weaknesses Behavioral/Psych: Mood is stable, no new changes  All other systems were reviewed with the patient and are negative.  I have reviewed the past medical history, past surgical history, social history and family history with the patient and they are unchanged from previous note.  ALLERGIES:  has No Known Allergies.  MEDICATIONS:  Current Outpatient Prescriptions  Medication Sig Dispense Refill  . acyclovir (ZOVIRAX) 400 MG tablet Take 1 tablet (400 mg total) by mouth daily. Per Dr. Alvy Bimler, continue for one year post-Velcade. 90 tablet 3  . aspirin 325 MG tablet Take 325 mg by mouth daily.    . calcium carbonate (TUMS - DOSED IN MG ELEMENTAL CALCIUM) 500 MG chewable tablet Chew 1 tablet by mouth as needed for indigestion or heartburn.    . carboxymethylcellulose (REFRESH PLUS) 0.5 % SOLN Place 1 drop into both eyes daily as needed (for dry eyes).     . Cholecalciferol (VITAMIN D3) 2000 UNITS TABS Take 2,000 Units by mouth daily.    . hydrochlorothiazide (HYDRODIURIL) 25 MG tablet Take 25 mg by mouth daily with breakfast.     . levothyroxine (SYNTHROID, LEVOTHROID) 75 MCG tablet Take 75 mcg by mouth daily before breakfast.    . Multiple Vitamins-Minerals (CENTRUM SILVER PO) Take 1 tablet by mouth daily.     . Oxycodone HCl 10 MG TABS Take 5-10 mg by mouth every 4 (four) hours as needed (for pain).    . polyethylene glycol (MIRALAX / GLYCOLAX) packet Take 17 g by mouth daily.    . sitaGLIPtin (JANUVIA) 50 MG tablet Take 50 mg  by mouth daily.    . traMADol (ULTRAM) 50 MG tablet Take 1 tablet (50 mg total) by mouth every 6 (six) hours as needed for moderate pain. 90 tablet 0  . venlafaxine XR (EFFEXOR-XR) 37.5 MG 24 hr capsule Take 37.5 mg by mouth daily with breakfast.    . verapamil (VERELAN PM) 120 MG 24 hr capsule Take 1 capsule by mouth  daily 15 capsule  0  . lenalidomide (REVLIMID) 10 MG capsule Take 1 capsule (10 mg total) by mouth daily. Take 1 capsule for 21 days, rest 7 days. (Patient not taking: Reported on 01/17/2015) 21 capsule 10  . metFORMIN (GLUCOPHAGE) 500 MG tablet Take 500 mg by mouth 2 (two) times daily with a meal.     No current facility-administered medications for this visit.    PHYSICAL EXAMINATION: ECOG PERFORMANCE STATUS: 1 - Symptomatic but completely ambulatory  Filed Vitals:   01/17/15 1022  BP: 134/87  Pulse: 75  Temp: 98.1 F (36.7 C)  Resp: 18   Filed Weights   01/17/15 1022  Weight: 172 lb (78.019 kg)    GENERAL:alert, no distress and comfortable SKIN: skin color, texture, turgor are normal, no rashes or significant lesions EYES: normal, Conjunctiva are pink and non-injected, sclera clear OROPHARYNX:no exudate, no erythema and lips, buccal mucosa, and tongue normal  NECK: supple, thyroid normal size, non-tender, without nodularity LYMPH:  no palpable lymphadenopathy in the cervical, axillary or inguinal LUNGS: clear to auscultation and percussion with normal breathing effort HEART: regular rate & rhythm and no murmurs and no lower extremity edema ABDOMEN:abdomen soft, non-tender and normal bowel sounds Musculoskeletal:no cyanosis of digits and no clubbing  NEURO: alert & oriented x 3 with fluent speech, no focal motor/sensory deficits  LABORATORY DATA:  I have reviewed the data as listed    Component Value Date/Time   NA 140 01/17/2015 1004   K 3.6 01/17/2015 1004   CO2 26 01/17/2015 1004   GLUCOSE 110 01/17/2015 1004   BUN 14.7 01/17/2015 1004   CREATININE 0.9 01/17/2015 1004   CALCIUM 9.5 01/17/2015 1004   PROT 7.1 01/17/2015 1004   ALBUMIN 3.6 01/17/2015 1004   AST 21 01/17/2015 1004   ALT 28 01/17/2015 1004   ALKPHOS 66 01/17/2015 1004   BILITOT 0.41 01/17/2015 1004    No results found for: SPEP, UPEP  Lab Results  Component Value Date   WBC 3.1* 01/17/2015   NEUTROABS 1.4*  01/17/2015   HGB 11.6 01/17/2015   HCT 36.7 01/17/2015   MCV 73.5* 01/17/2015   PLT 240 01/17/2015      Chemistry      Component Value Date/Time   NA 140 01/17/2015 1004   K 3.6 01/17/2015 1004   CO2 26 01/17/2015 1004   BUN 14.7 01/17/2015 1004   CREATININE 0.9 01/17/2015 1004      Component Value Date/Time   CALCIUM 9.5 01/17/2015 1004   ALKPHOS 66 01/17/2015 1004   AST 21 01/17/2015 1004   ALT 28 01/17/2015 1004   BILITOT 0.41 01/17/2015 1004     ASSESSMENT & PLAN:  Multiple myeloma without remission She tolerated Revlimid well without significant side-effects.  unfortunately, she has prolonged neutropenia.  per protocol, we will reduce the dose of Revlimid. We'll see her back within the month. If she tolerate a new dose well, we can space out her appointment in the future. 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   Abnormal thyroid function test The thyroid  function test  Had normalized last month. We will recheck it again in 3 months.     Bone pain This is unlikely due to her disease. I recommend her to continue taking oxycodone or tramadol as needed.    Diarrhea She has intermittent diarrhea alternate with constipation. I told her that her side effects is unrelated to Revlimid. She needs GI evaluation and she will discuss this with her primary care provider.  I also recommend dietary modification. She has been taking Imodium as needed.   Leukopenia due to antineoplastic chemotherapy This is likely due to recent treatment. The patient denies recent history of fevers, cough, chills, diarrhea or dysuria. She is asymptomatic from the leukopenia. I will observe for now.      Orders Placed This Encounter  Procedures  . CBC with Differential/Platelet    Standing Status: Standing     Number of Occurrences: 22     Standing Expiration Date: 01/18/2016  . Comprehensive metabolic panel    Standing Status: Standing     Number of  Occurrences: 22     Standing Expiration Date: 01/18/2016  . SPEP & IFE with QIG    Standing Status: Standing     Number of Occurrences: 9     Standing Expiration Date: 01/18/2016  . Kappa/lambda light chains    Standing Status: Standing     Number of Occurrences: 9     Standing Expiration Date: 01/18/2016  . TSH    Standing Status: Future     Number of Occurrences:      Standing Expiration Date: 02/21/2016   All questions were answered. The patient knows to call the clinic with any problems, questions or concerns. No barriers to learning was detected. I spent 25 minutes counseling the patient face to face. The total time spent in the appointment was 30 minutes and more than 50% was on counseling and review of test results     [facility], [place], MD 01/17/2015 11:17 AM

## 2015-01-18 MED ORDER — LENALIDOMIDE 10 MG PO CAPS CTSU E1A11: ORAL_CAPSULE | ORAL | Status: DC

## 2015-01-19 LAB — SPEP & IFE WITH QIG
Albumin ELP: 55 % — ABNORMAL LOW (ref 55.8–66.1)
Alpha-1-Globulin: 3.8 % (ref 2.9–4.9)
Alpha-2-Globulin: 8.9 % (ref 7.1–11.8)
Beta 2: 5.4 % (ref 3.2–6.5)
Beta Globulin: 5.9 % (ref 4.7–7.2)
Gamma Globulin: 21 % — ABNORMAL HIGH (ref 11.1–18.8)
IgA: 124 mg/dL (ref 69–380)
IgG (Immunoglobin G), Serum: 1590 mg/dL (ref 690–1700)
IgM, Serum: 55 mg/dL (ref 52–322)
M-Spike, %: 0.46 g/dL
Total Protein, Serum Electrophoresis: 6.8 g/dL (ref 6.0–8.3)

## 2015-01-19 LAB — KAPPA/LAMBDA LIGHT CHAINS
Kappa free light chain: 1.62 mg/dL (ref 0.33–1.94)
Kappa:Lambda Ratio: 0.98 (ref 0.26–1.65)
Lambda Free Lght Chn: 1.66 mg/dL (ref 0.57–2.63)

## 2015-01-22 ENCOUNTER — Other Ambulatory Visit: Payer: Self-pay | Admitting: Hematology and Oncology

## 2015-01-22 ENCOUNTER — Encounter: Payer: Self-pay | Admitting: Hematology and Oncology

## 2015-01-22 ENCOUNTER — Other Ambulatory Visit: Payer: Self-pay | Admitting: *Deleted

## 2015-01-22 DIAGNOSIS — M898X9 Other specified disorders of bone, unspecified site: Secondary | ICD-10-CM

## 2015-01-22 DIAGNOSIS — C9000 Multiple myeloma not having achieved remission: Secondary | ICD-10-CM

## 2015-01-22 MED ORDER — OXYCODONE HCL 10 MG PO TABS: 10.0000 mg | ORAL_TABLET | ORAL | Status: DC | PRN

## 2015-01-26 ENCOUNTER — Encounter: Payer: Self-pay | Admitting: Hematology and Oncology

## 2015-01-29 ENCOUNTER — Telehealth: Payer: Self-pay | Admitting: Hematology and Oncology

## 2015-01-29 ENCOUNTER — Telehealth: Payer: Self-pay | Admitting: *Deleted

## 2015-01-29 NOTE — Telephone Encounter (Signed)
Pt states she is fine having MRI. Currently has chest congestion, no energy. Temperature on Saturday was 101 and 102.5, no fever at present time. Dr Alvy Bimler wants to see patient on Wednesday with labs. Pt verbalized understanding

## 2015-01-29 NOTE — Telephone Encounter (Signed)
Labs/ov per 02/29 POF, pt is aware.... KJ

## 2015-01-30 ENCOUNTER — Other Ambulatory Visit: Payer: 59

## 2015-01-30 ENCOUNTER — Ambulatory Visit: Payer: 59 | Admitting: Hematology and Oncology

## 2015-01-31 ENCOUNTER — Other Ambulatory Visit (HOSPITAL_BASED_OUTPATIENT_CLINIC_OR_DEPARTMENT_OTHER): Payer: 59

## 2015-01-31 ENCOUNTER — Encounter: Payer: Self-pay | Admitting: Hematology and Oncology

## 2015-01-31 ENCOUNTER — Ambulatory Visit (HOSPITAL_BASED_OUTPATIENT_CLINIC_OR_DEPARTMENT_OTHER): Payer: 59 | Admitting: Hematology and Oncology

## 2015-01-31 ENCOUNTER — Telehealth: Payer: Self-pay | Admitting: Hematology and Oncology

## 2015-01-31 ENCOUNTER — Ambulatory Visit (HOSPITAL_COMMUNITY)
Admission: RE | Admit: 2015-01-31 | Discharge: 2015-01-31 | Disposition: A | Discharge status: Home / Self Care | Payer: 59 | Source: Ambulatory Visit | Attending: Hematology and Oncology | Admitting: Hematology and Oncology

## 2015-01-31 VITALS — BP 113/62 | HR 90 | Temp 99.2°F | Resp 18 | Ht 67.0 in | Wt 169.7 lb

## 2015-01-31 DIAGNOSIS — C9000 Multiple myeloma not having achieved remission: Secondary | ICD-10-CM

## 2015-01-31 DIAGNOSIS — J189 Pneumonia, unspecified organism: Secondary | ICD-10-CM

## 2015-01-31 DIAGNOSIS — R05 Cough: Secondary | ICD-10-CM | POA: Diagnosis not present

## 2015-01-31 DIAGNOSIS — M898X9 Other specified disorders of bone, unspecified site: Secondary | ICD-10-CM

## 2015-01-31 DIAGNOSIS — R0602 Shortness of breath: Secondary | ICD-10-CM | POA: Diagnosis present

## 2015-01-31 DIAGNOSIS — J101 Influenza due to other identified influenza virus with other respiratory manifestations: Secondary | ICD-10-CM | POA: Insufficient documentation

## 2015-01-31 DIAGNOSIS — M899 Disorder of bone, unspecified: Secondary | ICD-10-CM

## 2015-01-31 DIAGNOSIS — D701 Agranulocytosis secondary to cancer chemotherapy: Secondary | ICD-10-CM

## 2015-01-31 DIAGNOSIS — J40 Bronchitis, not specified as acute or chronic: Secondary | ICD-10-CM

## 2015-01-31 DIAGNOSIS — T451X5A Adverse effect of antineoplastic and immunosuppressive drugs, initial encounter: Secondary | ICD-10-CM

## 2015-01-31 DIAGNOSIS — J181 Lobar pneumonia, unspecified organism: Secondary | ICD-10-CM

## 2015-01-31 HISTORY — DX: Bronchitis, not specified as acute or chronic: J40

## 2015-01-31 LAB — COMPREHENSIVE METABOLIC PANEL (CC13)
ALT: 35 U/L (ref 0–55)
AST: 33 U/L (ref 5–34)
Albumin: 3.5 g/dL (ref 3.5–5.0)
Alkaline Phosphatase: 70 U/L (ref 40–150)
Anion Gap: 15 mEq/L — ABNORMAL HIGH (ref 3–11)
BUN: 10.6 mg/dL (ref 7.0–26.0)
CO2: 25 mEq/L (ref 22–29)
Calcium: 9 mg/dL (ref 8.4–10.4)
Chloride: 97 mEq/L — ABNORMAL LOW (ref 98–109)
Creatinine: 1 mg/dL (ref 0.6–1.1)
EGFR: 71 mL/min/{1.73_m2} — ABNORMAL LOW (ref >90)
Glucose: 103 mg/dl (ref 70–140)
Potassium: 3.2 mEq/L — ABNORMAL LOW (ref 3.5–5.1)
Sodium: 137 mEq/L (ref 136–145)
Total Bilirubin: 0.63 mg/dL (ref 0.20–1.20)
Total Protein: 7.5 g/dL (ref 6.4–8.3)

## 2015-01-31 LAB — CBC WITH DIFFERENTIAL/PLATELET
BASO%: 0.6 % (ref 0.0–2.0)
Basophils Absolute: 0 10*3/uL (ref 0.0–0.1)
EOS%: 0 % (ref 0.0–7.0)
Eosinophils Absolute: 0 10*3/uL (ref 0.0–0.5)
HCT: 38.3 % (ref 34.8–46.6)
HGB: 11.8 g/dL (ref 11.6–15.9)
LYMPH%: 17.9 % (ref 14.0–49.7)
MCH: 22.5 pg — ABNORMAL LOW (ref 25.1–34.0)
MCHC: 30.8 g/dL — ABNORMAL LOW (ref 31.5–36.0)
MCV: 73.1 fL — ABNORMAL LOW (ref 79.5–101.0)
MONO#: 0.5 10*3/uL (ref 0.1–0.9)
MONO%: 18.7 % — ABNORMAL HIGH (ref 0.0–14.0)
NEUT#: 1.8 10*3/uL (ref 1.5–6.5)
NEUT%: 62.8 % (ref 38.4–76.8)
Platelets: 173 10*3/uL (ref 145–400)
RBC: 5.24 10*6/uL (ref 3.70–5.45)
RDW: 16 % — ABNORMAL HIGH (ref 11.2–14.5)
WBC: 2.9 10*3/uL — ABNORMAL LOW (ref 3.9–10.3)
lymph#: 0.5 10*3/uL — ABNORMAL LOW (ref 0.9–3.3)

## 2015-01-31 MED ORDER — AMOXICILLIN-POT CLAVULANATE 875-125 MG PO TABS: 1.0000 | ORAL_TABLET | Freq: Two times a day (BID) | ORAL | Status: DC

## 2015-01-31 MED ORDER — PREDNISONE 50 MG PO TABS: 50.0000 mg | ORAL_TABLET | Freq: Every day | ORAL | Status: DC

## 2015-01-31 NOTE — Telephone Encounter (Signed)
Gave avs & calendar for March. °

## 2015-02-01 ENCOUNTER — Encounter: Payer: Self-pay | Admitting: Hematology and Oncology

## 2015-02-01 DIAGNOSIS — J189 Pneumonia, unspecified organism: Secondary | ICD-10-CM | POA: Insufficient documentation

## 2015-02-01 DIAGNOSIS — J181 Lobar pneumonia, unspecified organism: Secondary | ICD-10-CM

## 2015-02-01 NOTE — Assessment & Plan Note (Signed)
Her clinical findings today are compatible with pneumonia/respiratory tract infection. I will hold Revlimid today and proceed to treat her with antibiotics treatment and prednisone. We will reassess next week.

## 2015-02-01 NOTE — Progress Notes (Signed)
[place] OFFICE PROGRESS NOTE  Patient Care Team: Wenda Low, MD as PCP - General (Internal Medicine) Heath Lark, MD as Consulting Physician (Hematology and Oncology)  SUMMARY OF ONCOLOGIC HISTORY: Oncology History   ISS stage 1 IgG lambda subtype (serum albumin 3.6, Beta2 microglobulin 2.32) Durie Salmon Stage 1     Multiple myeloma without remission   10/10/2013 Imaging Skeletal survery was negative   11/09/2013 Bone Marrow Biopsy BM biopsy confirmed myeloma, 76% involved, IgG lambda subtype   12/06/2013 - 08/29/2014 Chemotherapy Sh received chemo with revlimid, Velcade, Dexamethasone and Zometa. Patient particpated in clinical research [facility] [phone]   02/23/2014 Bone Marrow Biopsy Repeat bone marrow biopsy showed 5% involvement   03/31/2014 Adverse Reaction Zometa was discontinued due to osteonecrosis of the jaw.   05/05/2014 Imaging Imaging study of the neck showed no explanation that could cause right neck pain. She is noted to have incidental left upper lung nodule. Plan to repeat imaging study in 3 months.   09/06/2014 Imaging Bone survey showed no evidence of fracture   09/14/2014 Bone Marrow Biopsy Bone marrow biopsy showed 8% residual plasma cells by manual count but none on the biopsy specimen   09/26/2014 -  Chemotherapy She is started on cycle 1 of maintenance Revlimid   01/31/2015 Imaging  chest x-ray showed pneumonia. Treatment was placed on hold.    INTERVAL HISTORY: Please see below for problem oriented charting.  she is seen urgently today because of high-grade fever of 101.2 and productive cough. She also complained of diffuse bone pain. These symptoms started 2 days ago. She denies recent hemoptysis. Denies recent diarrhea.  REVIEW OF SYSTEMS:   Constitutional: Denies fevers, chills or abnormal weight loss Eyes: Denies blurriness of vision Ears, nose, mouth, throat, and face: Denies mucositis or sore throat Cardiovascular: Denies palpitation, chest  discomfort or lower extremity swelling Gastrointestinal:  Denies nausea, heartburn or change in bowel habits Skin: Denies abnormal skin rashes Lymphatics: Denies new lymphadenopathy or easy bruising Neurological:Denies numbness, tingling or new weaknesses Behavioral/Psych: Mood is stable, no new changes  All other systems were reviewed with the patient and are negative.  I have reviewed the past medical history, past surgical history, social history and family history with the patient and they are unchanged from previous note.  ALLERGIES:  has No Known Allergies.  MEDICATIONS:  Current Outpatient Prescriptions  Medication Sig Dispense Refill  . acyclovir (ZOVIRAX) 400 MG tablet Take 1 tablet (400 mg total) by mouth daily. Per Dr. Alvy Bimler, continue for one year post-Velcade. 90 tablet 3  . aspirin 325 MG tablet Take 325 mg by mouth daily.    . calcium carbonate (TUMS - DOSED IN MG ELEMENTAL CALCIUM) 500 MG chewable tablet Chew 1 tablet by mouth as needed for indigestion or heartburn.    . carboxymethylcellulose (REFRESH PLUS) 0.5 % SOLN Place 1 drop into both eyes daily as needed (for dry eyes).     . Cholecalciferol (VITAMIN D3) 2000 UNITS TABS Take 2,000 Units by mouth daily.    . hydrochlorothiazide (HYDRODIURIL) 25 MG tablet Take 25 mg by mouth daily with breakfast.     . lenalidomide (REVLIMID) 10 MG capsule Take 1 capsule daily on days 1-21. Repeat every 28 days. 21 capsule 0  . levothyroxine (SYNTHROID, LEVOTHROID) 75 MCG tablet Take 75 mcg by mouth daily before breakfast.    . Multiple Vitamins-Minerals (CENTRUM SILVER PO) Take 1 tablet by mouth daily.     . Oxycodone HCl 10 MG TABS Take  1 tablet (10 mg total) by mouth every 4 (four) hours as needed (for pain). 60 tablet 0  . polyethylene glycol (MIRALAX / GLYCOLAX) packet Take 17 g by mouth daily.    . sitaGLIPtin (JANUVIA) 50 MG tablet Take 50 mg by mouth daily.    . traMADol (ULTRAM) 50 MG tablet Take 1 tablet (50 mg total) by  mouth every 6 (six) hours as needed for moderate pain. 90 tablet 0  . venlafaxine XR (EFFEXOR-XR) 37.5 MG 24 hr capsule Take 37.5 mg by mouth daily with breakfast.    . verapamil (VERELAN PM) 120 MG 24 hr capsule Take 1 capsule by mouth  daily 15 capsule 0  . amoxicillin-clavulanate (AUGMENTIN) 875-125 MG per tablet Take 1 tablet by mouth 2 (two) times daily. 14 tablet 0  . metFORMIN (GLUCOPHAGE) 500 MG tablet Take 500 mg by mouth 2 (two) times daily with a meal.    . predniSONE (DELTASONE) 50 MG tablet Take 1 tablet (50 mg total) by mouth daily with breakfast. 10 tablet 0   No current facility-administered medications for this visit.    PHYSICAL EXAMINATION: ECOG PERFORMANCE STATUS: 1 - Symptomatic but completely ambulatory  Filed Vitals:   01/31/15 1058  BP: 113/62  Pulse: 90  Temp: 99.2 F (37.3 C)  Resp: 18   Filed Weights   01/31/15 1058  Weight: 169 lb 11.2 oz (76.975 kg)    GENERAL:alert, no distress and comfortable SKIN: skin color, texture, turgor are normal, no rashes or significant lesions EYES: normal, Conjunctiva are pink and non-injected, sclera clear OROPHARYNX:no exudate, no erythema and lips, buccal mucosa, and tongue normal  NECK: supple, thyroid normal size, non-tender, without nodularity LYMPH:  no palpable lymphadenopathy in the cervical, axillary or inguinal LUNGS:  Diffuse lung wheezes. No crackles. HEART: regular rate & rhythm and no murmurs and no lower extremity edema ABDOMEN:abdomen soft, non-tender and normal bowel sounds Musculoskeletal:no cyanosis of digits and no clubbing  NEURO: alert & oriented x 3 with fluent speech, no focal motor/sensory deficits  LABORATORY DATA:  I have reviewed the data as listed    Component Value Date/Time   NA 137 01/31/2015 1043   K 3.2* 01/31/2015 1043   CO2 25 01/31/2015 1043   GLUCOSE 103 01/31/2015 1043   BUN 10.6 01/31/2015 1043   CREATININE 1.0 01/31/2015 1043   CALCIUM 9.0 01/31/2015 1043   PROT 7.5  01/31/2015 1043   ALBUMIN 3.5 01/31/2015 1043   AST 33 01/31/2015 1043   ALT 35 01/31/2015 1043   ALKPHOS 70 01/31/2015 1043   BILITOT 0.63 01/31/2015 1043    No results found for: SPEP, UPEP  Lab Results  Component Value Date   WBC 2.9* 01/31/2015   NEUTROABS 1.8 01/31/2015   HGB 11.8 01/31/2015   HCT 38.3 01/31/2015   MCV 73.1* 01/31/2015   PLT 173 01/31/2015      Chemistry      Component Value Date/Time   NA 137 01/31/2015 1043   K 3.2* 01/31/2015 1043   CO2 25 01/31/2015 1043   BUN 10.6 01/31/2015 1043   CREATININE 1.0 01/31/2015 1043      Component Value Date/Time   CALCIUM 9.0 01/31/2015 1043   ALKPHOS 70 01/31/2015 1043   AST 33 01/31/2015 1043   ALT 35 01/31/2015 1043   BILITOT 0.63 01/31/2015 1043       RADIOGRAPHIC STUDIES: I have personally reviewed the radiological images as listed and agreed with the findings in the report. Dg Chest 2  View  01/31/2015   CLINICAL DATA:  Shortness of breath.  Cough.  EXAM: CHEST  2 VIEW  COMPARISON:  10/21/2012.  FINDINGS: Mediastinum and hilar structures are normal. Low lung volumes with bibasilar atelectasis and/or infiltrates. Possible cavitary infiltrate in right upper lobe noted. Follow-up chest x-rays are recommended to demonstrate clearing. Heart size normal. No pleural effusion or pneumothorax. No acute bony abnormality identified.  IMPRESSION: 1. Possible cavitary infiltrate right upper lobe. 2. Low lung volumes with mild bibasilar atelectasis and/or infiltrates. Follow-up chest x-rays are suggested to demonstrate clearing.   Electronically Signed   By: Marcello Moores  Register   On: 01/31/2015 11:47     ASSESSMENT & PLAN:  Multiple myeloma without remission Her clinical findings today are compatible with pneumonia/respiratory tract infection. I will hold Revlimid today and proceed to treat her with antibiotics treatment and prednisone. We will reassess next week.   Bone pain  She has severe bone pain and I'm not sure  whether this is exacerbated by recent severe coughing.  I would like to order MRI to exclude any new pathological fracture but I would do that once she recovers from recent infection.   Leukopenia due to antineoplastic chemotherapy  I recommend holding off treatment. She is leukopenic and has signs of infection.   Right upper lobe pneumonia  Her clinical symptoms and X-ray confirmed probable pneumonia. I will proceed to start her on prednisone and antibiotic therapy. I will see her again next week and hold her treatment.    Orders Placed This Encounter  Procedures  . DG Chest 2 View    Standing Status: Future     Number of Occurrences: 1     Standing Expiration Date: 03/06/2016    Order Specific Question:  Reason for exam:    Answer:  cough, bronchitis    Order Specific Question:  Preferred imaging location?    Answer:  [facility]   All questions were answered. The patient knows to call the clinic with any problems, questions or concerns. No barriers to learning was detected. I spent 30 minutes counseling the patient face to face. The total time spent in the appointment was 40 minutes and more than 50% was on counseling and review of test results     [facility], [place], MD 02/01/2015 9:41 AM

## 2015-02-01 NOTE — Assessment & Plan Note (Signed)
Her clinical symptoms and X-ray confirmed probable pneumonia. I will proceed to start her on prednisone and antibiotic therapy. I will see her again next week and hold her treatment.

## 2015-02-01 NOTE — Assessment & Plan Note (Signed)
I recommend holding off treatment. She is leukopenic and has signs of infection.

## 2015-02-01 NOTE — Assessment & Plan Note (Signed)
She has severe bone pain and I'm not sure whether this is exacerbated by recent severe coughing.  I would like to order MRI to exclude any new pathological fracture but I would do that once she recovers from recent infection.

## 2015-02-02 LAB — KAPPA/LAMBDA LIGHT CHAINS
Kappa free light chain: 4.46 mg/dL — ABNORMAL HIGH (ref 0.33–1.94)
Kappa:Lambda Ratio: 1.3 (ref 0.26–1.65)
Lambda Free Lght Chn: 3.44 mg/dL — ABNORMAL HIGH (ref 0.57–2.63)

## 2015-02-02 LAB — SPEP & IFE WITH QIG
Albumin ELP: 49.7 % — ABNORMAL LOW (ref 55.8–66.1)
Alpha-1-Globulin: 5.1 % — ABNORMAL HIGH (ref 2.9–4.9)
Alpha-2-Globulin: 12.3 % — ABNORMAL HIGH (ref 7.1–11.8)
Beta 2: 5.9 % (ref 3.2–6.5)
Beta Globulin: 5.4 % (ref 4.7–7.2)
Gamma Globulin: 21.6 % — ABNORMAL HIGH (ref 11.1–18.8)
IgA: 141 mg/dL (ref 69–380)
IgG (Immunoglobin G), Serum: 1760 mg/dL — ABNORMAL HIGH (ref 690–1700)
IgM, Serum: 64 mg/dL (ref 52–322)
M-Spike, %: 0.56 g/dL
Total Protein, Serum Electrophoresis: 7.3 g/dL (ref 6.0–8.3)

## 2015-02-07 ENCOUNTER — Encounter: Payer: Self-pay | Admitting: Hematology and Oncology

## 2015-02-07 ENCOUNTER — Other Ambulatory Visit: Payer: Self-pay | Admitting: *Deleted

## 2015-02-07 ENCOUNTER — Ambulatory Visit (HOSPITAL_BASED_OUTPATIENT_CLINIC_OR_DEPARTMENT_OTHER): Payer: 59 | Admitting: Hematology and Oncology

## 2015-02-07 ENCOUNTER — Encounter: Payer: 59 | Admitting: *Deleted

## 2015-02-07 VITALS — BP 112/75 | HR 69 | Temp 98.1°F | Resp 18 | Ht 67.0 in | Wt 165.1 lb

## 2015-02-07 DIAGNOSIS — J189 Pneumonia, unspecified organism: Secondary | ICD-10-CM

## 2015-02-07 DIAGNOSIS — C9000 Multiple myeloma not having achieved remission: Secondary | ICD-10-CM

## 2015-02-07 DIAGNOSIS — M898X9 Other specified disorders of bone, unspecified site: Secondary | ICD-10-CM

## 2015-02-07 DIAGNOSIS — J181 Lobar pneumonia, unspecified organism: Secondary | ICD-10-CM

## 2015-02-07 MED ORDER — LENALIDOMIDE 5 MG PO CAPS: ORAL_CAPSULE | ORAL | Status: DC

## 2015-02-07 MED ORDER — ACYCLOVIR 400 MG PO TABS: 400.0000 mg | ORAL_TABLET | Freq: Every day | ORAL | Status: DC

## 2015-02-07 NOTE — Progress Notes (Signed)
[place] OFFICE PROGRESS NOTE  Patient Care Team: Wenda Low, MD as PCP - General (Internal Medicine) Heath Lark, MD as Consulting Physician (Hematology and Oncology)  SUMMARY OF ONCOLOGIC HISTORY: Oncology History   ISS stage 1 IgG lambda subtype (serum albumin 3.6, Beta2 microglobulin 2.32) Durie Salmon Stage 1     Multiple myeloma without remission   10/10/2013 Imaging Skeletal survery was negative   11/09/2013 Bone Marrow Biopsy BM biopsy confirmed myeloma, 76% involved, IgG lambda subtype   12/06/2013 - 08/29/2014 Chemotherapy Sh received chemo with revlimid, Velcade, Dexamethasone and Zometa. 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   05/05/2014 Imaging Imaging study of the neck showed no explanation that could cause right neck pain. She is noted to have incidental left upper lung nodule. Plan to repeat imaging study in 3 months.   09/06/2014 Imaging Bone survey showed no evidence of fracture   09/14/2014 Bone Marrow Biopsy Bone marrow biopsy showed 8% residual plasma cells by manual count but none on the biopsy specimen   09/26/2014 -  Chemotherapy She is started on cycle 1 of maintenance Revlimid   01/31/2015 Imaging  chest x-ray showed pneumonia. Treatment was placed on hold.    INTERVAL HISTORY: Please see below for problem oriented charting. She is seen for further follow-up. She has completed a course of antibiotic therapy and her symptoms of cough, congestion, bone pain has resolved.  REVIEW OF SYSTEMS:   Constitutional: Denies fevers, chills or abnormal weight loss Eyes: Denies blurriness of vision Ears, nose, mouth, throat, and face: Denies mucositis or sore throat Respiratory: Denies cough, dyspnea or wheezes Cardiovascular: Denies palpitation, chest discomfort or lower extremity  swelling Gastrointestinal:  Denies nausea, heartburn or change in bowel habits Skin: Denies abnormal skin rashes Lymphatics: Denies new lymphadenopathy or easy bruising Neurological:Denies numbness, tingling or new weaknesses Behavioral/Psych: Mood is stable, no new changes  All other systems were reviewed with the patient and are negative.  I have reviewed the past medical history, past surgical history, social history and family history with the patient and they are unchanged from previous note.  ALLERGIES:  has No Known Allergies.  MEDICATIONS:  Current Outpatient Prescriptions  Medication Sig Dispense Refill  . acyclovir (ZOVIRAX) 400 MG tablet Take 1 tablet (400 mg total) by mouth daily. Per Dr. Alvy Bimler, continue for one year post-Velcade. 90 tablet 3  . aspirin 325 MG tablet Take 325 mg by mouth daily.    . calcium carbonate (TUMS - DOSED IN MG ELEMENTAL CALCIUM) 500 MG chewable tablet Chew 1 tablet by mouth as needed for indigestion or heartburn.    . carboxymethylcellulose (REFRESH PLUS) 0.5 % SOLN Place 1 drop into both eyes daily as needed (for dry eyes).     . Cholecalciferol (VITAMIN D3) 2000 UNITS TABS Take 2,000 Units by mouth daily.    . hydrochlorothiazide (HYDRODIURIL) 25 MG tablet Take 25 mg by mouth daily with breakfast.     . lenalidomide (REVLIMID) 10 MG capsule Take 1 capsule daily on days 1-21. Repeat every 28 days. 21 capsule 0  . levothyroxine (SYNTHROID, LEVOTHROID) 75 MCG tablet Take 75 mcg by mouth daily before breakfast.    . Multiple Vitamins-Minerals (CENTRUM SILVER PO) Take 1 tablet by mouth daily.     . ONGLYZA 5 MG TABS tablet Take 5 mg by mouth.    Marland Kitchen  Oxycodone HCl 10 MG TABS Take 1 tablet (10 mg total) by mouth every 4 (four) hours as needed (for pain). 60 tablet 0  . polyethylene glycol (MIRALAX / GLYCOLAX) packet Take 17 g by mouth daily.    . traMADol (ULTRAM) 50 MG tablet Take 1 tablet (50 mg total) by mouth every 6 (six) hours as needed for moderate  pain. 90 tablet 0  . venlafaxine XR (EFFEXOR-XR) 37.5 MG 24 hr capsule Take 37.5 mg by mouth daily with breakfast.    . verapamil (VERELAN PM) 120 MG 24 hr capsule Take 1 capsule by mouth  daily 15 capsule 0  . lenalidomide (REVLIMID) 5 MG capsule Take 1 tablet for 21 days and then rest 7 days 21 capsule 0   No current facility-administered medications for this visit.    PHYSICAL EXAMINATION: ECOG PERFORMANCE STATUS: 0 - Asymptomatic  Filed Vitals:   02/07/15 0837  BP: 112/75  Pulse: 69  Temp: 98.1 F (36.7 C)  Resp: 18   Filed Weights   02/07/15 0837  Weight: 165 lb 1.6 oz (74.889 kg)    GENERAL:alert, no distress and comfortable SKIN: skin color, texture, turgor are normal, no rashes or significant lesions EYES: normal, Conjunctiva are pink and non-injected, sclera clear OROPHARYNX:no exudate, no erythema and lips, buccal mucosa, and tongue normal  NECK: supple, thyroid normal size, non-tender, without nodularity LYMPH:  no palpable lymphadenopathy in the cervical, axillary or inguinal LUNGS: clear to auscultation and percussion with normal breathing effort HEART: regular rate & rhythm and no murmurs and no lower extremity edema ABDOMEN:abdomen soft, non-tender and normal bowel sounds Musculoskeletal:no cyanosis of digits and no clubbing  NEURO: alert & oriented x 3 with fluent speech, no focal motor/sensory deficits  LABORATORY DATA:  I have reviewed the data as listed    Component Value Date/Time   NA 137 01/31/2015 1043   K 3.2* 01/31/2015 1043   CO2 25 01/31/2015 1043   GLUCOSE 103 01/31/2015 1043   BUN 10.6 01/31/2015 1043   CREATININE 1.0 01/31/2015 1043   CALCIUM 9.0 01/31/2015 1043   PROT 7.5 01/31/2015 1043   ALBUMIN 3.5 01/31/2015 1043   AST 33 01/31/2015 1043   ALT 35 01/31/2015 1043   ALKPHOS 70 01/31/2015 1043   BILITOT 0.63 01/31/2015 1043    No results found for: SPEP, UPEP  Lab Results  Component Value Date   WBC 2.9* 01/31/2015    NEUTROABS 1.8 01/31/2015   HGB 11.8 01/31/2015   HCT 38.3 01/31/2015   MCV 73.1* 01/31/2015   PLT 173 01/31/2015      Chemistry      Component Value Date/Time   NA 137 01/31/2015 1043   K 3.2* 01/31/2015 1043   CO2 25 01/31/2015 1043   BUN 10.6 01/31/2015 1043   CREATININE 1.0 01/31/2015 1043      Component Value Date/Time   CALCIUM 9.0 01/31/2015 1043   ALKPHOS 70 01/31/2015 1043   AST 33 01/31/2015 1043   ALT 35 01/31/2015 1043   BILITOT 0.63 01/31/2015 1043       RADIOGRAPHIC STUDIES: I reviewed the most recent chest x-ray with her and her husband. I have personally reviewed the radiological images as listed and agreed with the findings in the report.  ASSESSMENT & PLAN:  Multiple myeloma without remission Her recent lower respiratory tract infection has resolved.  I reviewed the current protocol with my Research officer, political party. She will start a week break and I plan to see her back next  week to start another cycle of Revlimid, at reduced dose, 5 mg daily for 21 days, rest 7 days.   Bone pain Her symptoms of bone pain has resolved. She has no new neurological deficit.   Right upper lobe pneumonia Her symptoms has resolved. She has completed a course of antibiotic treatment.    No orders of the defined types were placed in this encounter.   All questions were answered. The patient knows to call the clinic with any problems, questions or concerns. No barriers to learning was detected. I spent 15 minutes counseling the patient face to face. The total time spent in the appointment was 20 minutes and more than 50% was on counseling and review of test results     [facility], [place], MD 02/07/2015 9:17 AM

## 2015-02-07 NOTE — Assessment & Plan Note (Signed)
Her recent lower respiratory tract infection has resolved.  I reviewed the current protocol with my Research officer, political party. She will start a week break and I plan to see her back next week to start another cycle of Revlimid, at reduced dose, 5 mg daily for 21 days, rest 7 days.

## 2015-02-07 NOTE — Assessment & Plan Note (Signed)
Her symptoms has resolved. She has completed a course of antibiotic treatment.

## 2015-02-07 NOTE — Assessment & Plan Note (Signed)
Her symptoms of bone pain has resolved. She has no new neurological deficit.

## 2015-02-07 NOTE — Progress Notes (Signed)
02/07/2015 Patient in to clinic today for follow up visit after last week's unscheduled visit for treatment of pneumonia. Patient's condition is much improved today. Treatment was started on 01/08/2015 with last dose taken on 01/30/2015 prior to treatment hold for illness. Per review of protocol section 5.4.1 and 5.4.8  for lenalidomide dose modifications on Arm D maintenance, lenalidomide should be reduced to the nexr lower dose level until Day 21. With today being Day 21 of the cycle, and no option to obtain one dose at the reduced level for administration today, treatment will be held today, with plans to resume at reduced dose on Cycle 5, Day 1, currently scheduled for 02/16/2015. Patient is in agreement with this plan. [name] BSN, RN, [place] 02/07/2015 9:43 AM

## 2015-02-08 ENCOUNTER — Other Ambulatory Visit: Payer: Self-pay | Admitting: *Deleted

## 2015-02-08 DIAGNOSIS — C9000 Multiple myeloma not having achieved remission: Secondary | ICD-10-CM

## 2015-02-14 ENCOUNTER — Telehealth: Payer: Self-pay | Admitting: *Deleted

## 2015-02-14 NOTE — Telephone Encounter (Signed)
Faxed Revlimid refill to [facility].

## 2015-02-16 ENCOUNTER — Encounter: Payer: Self-pay | Admitting: Hematology and Oncology

## 2015-02-16 ENCOUNTER — Other Ambulatory Visit (HOSPITAL_BASED_OUTPATIENT_CLINIC_OR_DEPARTMENT_OTHER): Payer: 59

## 2015-02-16 ENCOUNTER — Ambulatory Visit (HOSPITAL_BASED_OUTPATIENT_CLINIC_OR_DEPARTMENT_OTHER): Payer: 59 | Admitting: Hematology and Oncology

## 2015-02-16 ENCOUNTER — Telehealth: Payer: Self-pay | Admitting: Hematology and Oncology

## 2015-02-16 ENCOUNTER — Encounter: Payer: 59 | Admitting: *Deleted

## 2015-02-16 VITALS — BP 116/75 | HR 67 | Temp 98.0°F | Resp 18 | Ht 67.0 in | Wt 170.6 lb

## 2015-02-16 DIAGNOSIS — G43709 Chronic migraine without aura, not intractable, without status migrainosus: Secondary | ICD-10-CM

## 2015-02-16 DIAGNOSIS — R946 Abnormal results of thyroid function studies: Secondary | ICD-10-CM

## 2015-02-16 DIAGNOSIS — Z006 Encounter for examination for normal comparison and control in clinical research program: Secondary | ICD-10-CM

## 2015-02-16 DIAGNOSIS — D630 Anemia in neoplastic disease: Secondary | ICD-10-CM

## 2015-02-16 DIAGNOSIS — C9000 Multiple myeloma not having achieved remission: Secondary | ICD-10-CM | POA: Diagnosis not present

## 2015-02-16 DIAGNOSIS — G43009 Migraine without aura, not intractable, without status migrainosus: Secondary | ICD-10-CM

## 2015-02-16 DIAGNOSIS — J181 Lobar pneumonia, unspecified organism: Secondary | ICD-10-CM

## 2015-02-16 DIAGNOSIS — J189 Pneumonia, unspecified organism: Secondary | ICD-10-CM

## 2015-02-16 DIAGNOSIS — M898X9 Other specified disorders of bone, unspecified site: Secondary | ICD-10-CM

## 2015-02-16 DIAGNOSIS — G620 Drug-induced polyneuropathy: Secondary | ICD-10-CM

## 2015-02-16 DIAGNOSIS — G43719 Chronic migraine without aura, intractable, without status migrainosus: Secondary | ICD-10-CM

## 2015-02-16 DIAGNOSIS — T451X5A Adverse effect of antineoplastic and immunosuppressive drugs, initial encounter: Secondary | ICD-10-CM

## 2015-02-16 LAB — COMPREHENSIVE METABOLIC PANEL (CC13)
ALT: 37 U/L (ref 0–55)
AST: 15 U/L (ref 5–34)
Albumin: 3.1 g/dL — ABNORMAL LOW (ref 3.5–5.0)
Alkaline Phosphatase: 55 U/L (ref 40–150)
Anion Gap: 7 mEq/L (ref 3–11)
BUN: 10.6 mg/dL (ref 7.0–26.0)
CO2: 24 mEq/L (ref 22–29)
Calcium: 8.8 mg/dL (ref 8.4–10.4)
Chloride: 109 mEq/L (ref 98–109)
Creatinine: 0.7 mg/dL (ref 0.6–1.1)
EGFR: >90 mL/min/{1.73_m2} (ref >90)
Glucose: 117 mg/dl (ref 70–140)
Potassium: 4 mEq/L (ref 3.5–5.1)
Sodium: 141 mEq/L (ref 136–145)
Total Bilirubin: 0.31 mg/dL (ref 0.20–1.20)
Total Protein: 6.3 g/dL — ABNORMAL LOW (ref 6.4–8.3)

## 2015-02-16 LAB — CBC WITH DIFFERENTIAL/PLATELET
BASO%: 0.2 % (ref 0.0–2.0)
Basophils Absolute: 0 10*3/uL (ref 0.0–0.1)
EOS%: 0.9 % (ref 0.0–7.0)
Eosinophils Absolute: 0 10*3/uL (ref 0.0–0.5)
HCT: 34.6 % — ABNORMAL LOW (ref 34.8–46.6)
HGB: 10.8 g/dL — ABNORMAL LOW (ref 11.6–15.9)
LYMPH%: 36.2 % (ref 14.0–49.7)
MCH: 22.8 pg — ABNORMAL LOW (ref 25.1–34.0)
MCHC: 31.2 g/dL — ABNORMAL LOW (ref 31.5–36.0)
MCV: 73.2 fL — ABNORMAL LOW (ref 79.5–101.0)
MONO#: 0.4 10*3/uL (ref 0.1–0.9)
MONO%: 9 % (ref 0.0–14.0)
NEUT#: 2.5 10*3/uL (ref 1.5–6.5)
NEUT%: 53.7 % (ref 38.4–76.8)
Platelets: 306 10*3/uL (ref 145–400)
RBC: 4.73 10*6/uL (ref 3.70–5.45)
RDW: 16.7 % — ABNORMAL HIGH (ref 11.2–14.5)
WBC: 4.6 10*3/uL (ref 3.9–10.3)
lymph#: 1.7 10*3/uL (ref 0.9–3.3)
nRBC: 0 % (ref 0–0)

## 2015-02-16 MED ORDER — LENALIDOMIDE 5 MG PO CAPS CTSU E1A11: ORAL_CAPSULE | ORAL | Status: DC

## 2015-02-16 NOTE — Assessment & Plan Note (Signed)
She has intermittent migraine which is chronic. I will defer to her primary care provider for management of this. I recommend she try some caffeinated drinks. In the meantime, she can continue taking Advil as needed.

## 2015-02-16 NOTE — Assessment & Plan Note (Signed)
Her recent lower respiratory tract infection has resolved.  I reviewed the current protocol with my Research officer, political party. She will start a week break and I plan to see her back next week to start another cycle of Revlimid, at reduced dose, 5 mg daily for 21 days, rest 7 days. The patient has significant disability from her disease. I recommend she does not return back to work. I have spent some time filling out application for disability.

## 2015-02-16 NOTE — Assessment & Plan Note (Signed)
The thyroid function test  Had normalized last month. We will recheck it again in 3 months.

## 2015-02-16 NOTE — Assessment & Plan Note (Signed)
This is likely due to recent treatment. The patient denies recent history of bleeding such as epistaxis, hematuria or hematochezia. She is asymptomatic from the anemia. I will observe for now.   

## 2015-02-16 NOTE — Telephone Encounter (Signed)
Pt confirmed labs/ov per 03/18 POF, gave pt AVS and Calendar......Marland Kitchen KJ

## 2015-02-16 NOTE — Progress Notes (Signed)
[place] OFFICE PROGRESS NOTE  Patient Care Team: Wenda Low, MD as PCP - General (Internal Medicine) Heath Lark, MD as Consulting Physician (Hematology and Oncology)  SUMMARY OF ONCOLOGIC HISTORY: Oncology History   ISS stage 1 IgG lambda subtype (serum albumin 3.6, Beta2 microglobulin 2.32) Durie Salmon Stage 1     Multiple myeloma without remission   10/10/2013 Imaging Skeletal survery was negative   11/09/2013 Bone Marrow Biopsy BM biopsy confirmed myeloma, 76% involved, IgG lambda subtype   12/06/2013 - 08/29/2014 Chemotherapy Sh received chemo with revlimid, Velcade, Dexamethasone and Zometa. 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   05/05/2014 Imaging Imaging study of the neck showed no explanation that could cause right neck pain. She is noted to have incidental left upper lung nodule. Plan to repeat imaging study in 3 months.   09/06/2014 Imaging Bone survey showed no evidence of fracture   09/14/2014 Bone Marrow Biopsy Bone marrow biopsy showed 8% residual plasma cells by manual count but none on the biopsy specimen   09/26/2014 -  Chemotherapy She is started on cycle 1 of maintenance Revlimid   01/31/2015 Imaging  chest x-ray showed pneumonia. Treatment was placed on hold.    INTERVAL HISTORY: Please see below for problem oriented charting. She returns today for further follow-up before restarting her back on chemotherapy. Her recent infection has resolved. She complained of intermittent migraines. She also complained of severe neuropathic pain radiating down her legs and chronic back pain in her back.  REVIEW OF SYSTEMS:   Constitutional: Denies fevers, chills or abnormal weight loss Eyes: Denies blurriness of vision Ears, nose, mouth, throat, and face: Denies mucositis or sore  throat Respiratory: Denies cough, dyspnea or wheezes Cardiovascular: Denies palpitation, chest discomfort or lower extremity swelling Gastrointestinal:  Denies nausea, heartburn or change in bowel habits Skin: Denies abnormal skin rashes Lymphatics: Denies new lymphadenopathy or easy bruising Neurological:Denies numbness, tingling or new weaknesses Behavioral/Psych: Mood is stable, no new changes  All other systems were reviewed with the patient and are negative.  I have reviewed the past medical history, past surgical history, social history and family history with the patient and they are unchanged from previous note.  ALLERGIES:  has No Known Allergies.  MEDICATIONS:  Current Outpatient Prescriptions  Medication Sig Dispense Refill  . acyclovir (ZOVIRAX) 400 MG tablet Take 1 tablet (400 mg total) by mouth daily. Per Dr. Alvy Bimler, continue for one year post-Velcade. 90 tablet 3  . aspirin 325 MG tablet Take 325 mg by mouth daily.    . calcium carbonate (TUMS - DOSED IN MG ELEMENTAL CALCIUM) 500 MG chewable tablet Chew 1 tablet by mouth as needed for indigestion or heartburn.    . carboxymethylcellulose (REFRESH PLUS) 0.5 % SOLN Place 1 drop into both eyes daily as needed (for dry eyes).     . Cholecalciferol (VITAMIN D3) 2000 UNITS TABS Take 2,000 Units by mouth daily.    . hydrochlorothiazide (HYDRODIURIL) 25 MG tablet Take 25 mg by mouth daily with breakfast.     . levothyroxine (SYNTHROID, LEVOTHROID) 75 MCG tablet Take 75 mcg by mouth daily before breakfast.    . Multiple Vitamins-Minerals (CENTRUM SILVER PO) Take 1 tablet by mouth daily.     . ONGLYZA 5 MG TABS tablet Take 5 mg by mouth.    . Oxycodone HCl  10 MG TABS Take 1 tablet (10 mg total) by mouth every 4 (four) hours as needed (for pain). 60 tablet 0  . polyethylene glycol (MIRALAX / GLYCOLAX) packet Take 17 g by mouth daily.    . traMADol (ULTRAM) 50 MG tablet Take 1 tablet (50 mg total) by mouth every 6 (six) hours as  needed for moderate pain. 90 tablet 0  . venlafaxine XR (EFFEXOR-XR) 37.5 MG 24 hr capsule Take 37.5 mg by mouth daily with breakfast.    . verapamil (VERELAN PM) 120 MG 24 hr capsule Take 1 capsule by mouth  daily 15 capsule 0   No current facility-administered medications for this visit.    PHYSICAL EXAMINATION: ECOG PERFORMANCE STATUS: 1 - Symptomatic but completely ambulatory  Filed Vitals:   02/16/15 1156  BP: 116/75  Pulse: 67  Temp: 98 F (36.7 C)  Resp: 18   Filed Weights   02/16/15 1156  Weight: 170 lb 9.6 oz (77.384 kg)    GENERAL:alert, no distress and comfortable SKIN: skin color, texture, turgor are normal, no rashes or significant lesions EYES: normal, Conjunctiva are pink and non-injected, sclera clear OROPHARYNX:no exudate, no erythema and lips, buccal mucosa, and tongue normal  NECK: supple, thyroid normal size, non-tender, without nodularity LYMPH:  no palpable lymphadenopathy in the cervical, axillary or inguinal LUNGS: clear to auscultation and percussion with normal breathing effort HEART: regular rate & rhythm and no murmurs and no lower extremity edema ABDOMEN:abdomen soft, non-tender and normal bowel sounds Musculoskeletal:no cyanosis of digits and no clubbing  NEURO: alert & oriented x 3 with fluent speech, no focal motor/sensory deficits  LABORATORY DATA:  I have reviewed the data as listed    Component Value Date/Time   NA 141 02/16/2015 1146   K 4.0 02/16/2015 1146   CO2 24 02/16/2015 1146   GLUCOSE 117 02/16/2015 1146   BUN 10.6 02/16/2015 1146   CREATININE 0.7 02/16/2015 1146   CALCIUM 8.8 02/16/2015 1146   PROT 6.3* 02/16/2015 1146   ALBUMIN 3.1* 02/16/2015 1146   AST 15 02/16/2015 1146   ALT 37 02/16/2015 1146   ALKPHOS 55 02/16/2015 1146   BILITOT 0.31 02/16/2015 1146    No results found for: SPEP, UPEP  Lab Results  Component Value Date   WBC 4.6 02/16/2015   NEUTROABS 2.5 02/16/2015   HGB 10.8* 02/16/2015   HCT 34.6*  02/16/2015   MCV 73.2* 02/16/2015   PLT 306 02/16/2015      Chemistry      Component Value Date/Time   NA 141 02/16/2015 1146   K 4.0 02/16/2015 1146   CO2 24 02/16/2015 1146   BUN 10.6 02/16/2015 1146   CREATININE 0.7 02/16/2015 1146      Component Value Date/Time   CALCIUM 8.8 02/16/2015 1146   ALKPHOS 55 02/16/2015 1146   AST 15 02/16/2015 1146   ALT 37 02/16/2015 1146   BILITOT 0.31 02/16/2015 1146      ASSESSMENT & PLAN:  Multiple myeloma without remission Her recent lower respiratory tract infection has resolved.  I reviewed the current protocol with my Research officer, political party. She will start a week break and I plan to see her back next week to start another cycle of Revlimid, at reduced dose, 5 mg daily for 21 days, rest 7 days. The patient has significant disability from her disease. I recommend she does not return back to work. I have spent some time filling out application for disability.    Abnormal thyroid function test  The thyroid function test  Had normalized last month. We will recheck it again in 3 months.    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 The patient denies recent history of bleeding such as epistaxis, hematuria or hematochezia. She is asymptomatic from the anemia. I will observe for now.    Bone pain Her symptoms of bone pain has improved. She has no new neurological deficit.     Right upper lobe pneumonia Her symptoms has resolved. She has completed a course of antibiotic treatment. Due to her immunocompromised state, I recommend she stays off work.     Intractable chronic migraine without aura She has intermittent migraine which is chronic. I will defer to her primary care provider for management of this. I recommend she try some caffeinated drinks. In the meantime, she can continue taking Advil as needed.   Neuropathy due to chemotherapeutic drug She has significant neuropathic pain likely  exacerbated by recent chemotherapy causing significant disability. I recommend MRI for further evaluation. The patient declined for now and once her continue taking pain medicine as needed.    No orders of the defined types were placed in this encounter.   All questions were answered. The patient knows to call the clinic with any problems, questions or concerns. No barriers to learning was detected. I spent 30 minutes counseling the patient face to face. The total time spent in the appointment was 40 minutes and more than 50% was on counseling and review of test results     [facility], Braelyn Bordonaro, MD 02/16/2015 1:57 PM

## 2015-02-16 NOTE — Assessment & Plan Note (Signed)
Her symptoms has resolved. She has completed a course of antibiotic treatment. Due to her immunocompromised state, I recommend she stays off work.

## 2015-02-16 NOTE — Assessment & Plan Note (Signed)
She has significant neuropathic pain likely exacerbated by recent chemotherapy causing significant disability. I recommend MRI for further evaluation. The patient declined for now and once her continue taking pain medicine as needed.

## 2015-02-16 NOTE — Progress Notes (Signed)
I placed disability forms for patient on the desk of nurse for the patient.

## 2015-02-16 NOTE — Progress Notes (Signed)
I called and spoke with patient and advised I faxed to [facility] and she wants a copy for herself. I verified address.

## 2015-02-16 NOTE — Progress Notes (Signed)
I faxed LTD forms to [facility]

## 2015-02-16 NOTE — Assessment & Plan Note (Signed)
Her symptoms of bone pain has improved. She has no new neurological deficit.

## 2015-02-16 NOTE — Progress Notes (Signed)
02/16/2015 Patient in to clinic today for evaluation prior to initiating maintenance cycle 5. Patient is feeling much better and improved over the last two weeks. Patient brought in pill bottle showing her new prescription for Revlimid at 5mg  daily for 21 days, to begin today. Based on lab results review and history and physical  by Dr. Alvy Bimler, patient condition is acceptable for continued treatment. Patient returned February and March calendars with modified dosing in Cycle 4 documented. Patient was given calendars for March and April with Cycle 5 treatment days marked. She will begin Revlimid dosing today. 02/16/2015

## 2015-02-20 ENCOUNTER — Telehealth: Payer: Self-pay | Admitting: Hematology and Oncology

## 2015-02-20 LAB — KAPPA/LAMBDA LIGHT CHAINS
Kappa free light chain: 1.37 mg/dL (ref 0.33–1.94)
Kappa:Lambda Ratio: 0.66 (ref 0.26–1.65)
Lambda Free Lght Chn: 2.07 mg/dL (ref 0.57–2.63)

## 2015-02-20 LAB — SPEP & IFE WITH QIG
Albumin ELP: 3.3 g/dL — ABNORMAL LOW (ref 3.8–4.8)
Alpha-1-Globulin: 0.3 g/dL (ref 0.2–0.3)
Alpha-2-Globulin: 0.6 g/dL (ref 0.5–0.9)
Beta 2: 0.3 g/dL (ref 0.2–0.5)
Beta Globulin: 0.4 g/dL (ref 0.4–0.6)
Gamma Globulin: 1.1 g/dL (ref 0.8–1.7)
IgA: 103 mg/dL (ref 69–380)
IgG (Immunoglobin G), Serum: 1230 mg/dL (ref 690–1700)
IgM, Serum: 54 mg/dL (ref 52–322)
M-Spike, %: 0.3 g/dL
Total Protein, Serum Electrophoresis: 5.9 g/dL — ABNORMAL LOW (ref 6.1–8.1)

## 2015-02-20 NOTE — Telephone Encounter (Signed)
Per 03/18 POF, changed MD visit to following week, mailed schedule and sent msg to pt on my chart.... KJ

## 2015-03-01 ENCOUNTER — Ambulatory Visit: Payer: Self-pay | Admitting: Adult Health

## 2015-03-07 ENCOUNTER — Telehealth: Payer: Self-pay | Admitting: *Deleted

## 2015-03-07 NOTE — Telephone Encounter (Signed)
[facility] faxed Revlimid refill request.  Request to provider's desk/in-basket for review. 

## 2015-03-08 ENCOUNTER — Ambulatory Visit (INDEPENDENT_AMBULATORY_CARE_PROVIDER_SITE_OTHER): Payer: 59 | Admitting: Adult Health

## 2015-03-08 ENCOUNTER — Encounter: Payer: Self-pay | Admitting: Adult Health

## 2015-03-08 ENCOUNTER — Other Ambulatory Visit: Payer: Self-pay | Admitting: *Deleted

## 2015-03-08 ENCOUNTER — Telehealth: Payer: Self-pay | Admitting: *Deleted

## 2015-03-08 VITALS — BP 127/77 | HR 64 | Ht 67.0 in | Wt 169.0 lb

## 2015-03-08 DIAGNOSIS — G43719 Chronic migraine without aura, intractable, without status migrainosus: Secondary | ICD-10-CM

## 2015-03-08 DIAGNOSIS — C9000 Multiple myeloma not having achieved remission: Secondary | ICD-10-CM

## 2015-03-08 MED ORDER — VENLAFAXINE HCL ER 75 MG PO CP24: 75.0000 mg | ORAL_CAPSULE | Freq: Every day | ORAL | Status: DC

## 2015-03-08 MED ORDER — VERAPAMIL HCL ER 120 MG PO CP24: ORAL_CAPSULE | ORAL | Status: DC

## 2015-03-08 MED ORDER — LENALIDOMIDE 5 MG PO CAPS CTSU E1A11: ORAL_CAPSULE | ORAL | Status: DC

## 2015-03-08 NOTE — Telephone Encounter (Signed)
Patient form [employer] on [address].

## 2015-03-08 NOTE — Patient Instructions (Signed)
Increase Effexor to 75 mg daily  Continue Verapamil.  If headache frequency dose not improve please let us know.

## 2015-03-08 NOTE — Progress Notes (Addendum)
PATIENT: Carla Perry DOB: June 27, 1956  REASON FOR VISIT: follow up- chronic migraine HISTORY FROM: patient  HISTORY OF PRESENT ILLNESS: Carla Perry is a 59 year old female with a history of chronic migraines. She returns today for follow-up. The patient is currently taking Effexor 37.5 mg daily as well as verapamil 120 mg daily. She reports that at the last visit Dr. Janann Perry decreased her Effexor. She states since then she has had 13 headaches in March and she has had 4 headaches in April so far. She states her headaches are normally located on the left side and associated with left-sided numbness. She confirms nausea, photophobia and phonophobia. She also states that when the headaches become severe its as if she is on the verge of having vertigo. She states that she can take tramadol and her headache will normally resolve in 1-2 hours. She states that if she gets a headache on the right side it usually resolves with Advil migraine. The patient does have multiple myeloma and takes a chemotherapy pill. She denies any new neurological symptoms. Denies any new medical issues. She returns today for evaluation.  HISTORY 08/02/14 Carla Perry): Carla Perry is a 59 y.o. female, former Dr Erling Cruz patient, here as a follow up appointment for chronic migraines with last visit being on 08/09/2013 at which time she received Botox injections. She notes good benefit from these injections. Did not follow up for repeat injections. Since last visit she was unfortunately diagnosed with multiple myeloma and has been undergoing treatment with oncology. Reports so far things are going well. She has one more cycle and then she is finished. She reports she has held off on the Botox injections per recommendation of her oncologist, instructed she can restart once she is done with chemotherapy.   Continues to have headaches. Typically will have 1 to 3 headaches per month. Takes tramadol and falls asleep, when she wakes up the headache  is gone. Describes it is a predominantly right sided temporal squeezing pounding type pain. + Nausea, no emesis, + photo and phonophobia. Headaches last 6 hours to all day. Notes she gets weak on the left side with the headaches and has baseline numbness on the left side of her body. Reports she has not had a MRI brain in years. She notes Dr Erling Cruz started her on Effexor and Verapamil. She wishes to get off both of these medications as she feels they are not helping.   Recently saw eye doctor for right eye pain, told she may have glaucoma in her right eye, they will continue to monitor. They also told her that she has dry eyes, was given lubricating eye drops. The eye drops have improved the eye pain.   Initial visit 05/2013: Former Dr Erling Cruz patient. She reports being overall stable. Continues to have daily migrainous headache which have been ongoing for greater then 4 years. Predominantly R sided temporal, described as squeezing and pounding. These headaches are her baseline headache and are occuring on a near continuous pattern every day of the month. Is also having severe "flare ups" where the pain worsens and becomes a more sharp/intense pain. These episodes are occuring around 8 to 10 times per month. With these headaches she continues to have episodes of transient unilateral (predominantly R sided) weakness that self resolves. Also notes occasional unilateral sensory changes (again R sided predominant). Also has episodes of transient vertigo associated with the headache, has occurred 2 times in the past month, requiring her to miss work. She  has had multiple MRIs during these events to rule out stroke and no ischemic changes have been found. Has been unable to tolerate most abortive agents and due to presence of hemiplegic/basilar symptoms she is not a candidate for triptans or DHE. In regards to prophylactic agents she has tried for >3-4 months the following agents and failed/did not tolerate: Topamax (AED),  Propranolol (BB), verapamil (CCB) and Effexor (SNRI). Remains on verapmil and Effexor per Dr Erling Cruz but no benefit noted. Denies any family history of hemiplegic migraine.   Continues to have subjective memory difficulties, notes a "fogginess". Has had formal neuro-psych testing which showed an improvement in her overall cognitive function. Continues to have episodes of auditory hallucinations. Has not seen a psychiatrist for this.     REVIEW OF SYSTEMS: Out of a complete 14 system review of symptoms, the patient complains only of the following symptoms, and all other reviewed systems are negative.  Headache, back pain  ALLERGIES: No Known Allergies  HOME MEDICATIONS: Outpatient Prescriptions Prior to Visit  Medication Sig Dispense Refill  . acyclovir (ZOVIRAX) 400 MG tablet Take 1 tablet (400 mg total) by mouth daily. Per Dr. Alvy Bimler, continue for one year post-Velcade. 90 tablet 3  . aspirin 325 MG tablet Take 325 mg by mouth daily.    . calcium carbonate (TUMS - DOSED IN MG ELEMENTAL CALCIUM) 500 MG chewable tablet Chew 1 tablet by mouth as needed for indigestion or heartburn.    . carboxymethylcellulose (REFRESH PLUS) 0.5 % SOLN Place 1 drop into both eyes daily as needed (for dry eyes).     . Cholecalciferol (VITAMIN D3) 2000 UNITS TABS Take 2,000 Units by mouth daily.    . hydrochlorothiazide (HYDRODIURIL) 25 MG tablet Take 25 mg by mouth daily with breakfast.     . lenalidomide (REVLIMID) 5 MG capsule Take 1 capsule daily on days 1-21. Repeat every 28 days. 21 capsule 0  . levothyroxine (SYNTHROID, LEVOTHROID) 75 MCG tablet Take 75 mcg by mouth daily before breakfast.    . Multiple Vitamins-Minerals (CENTRUM SILVER PO) Take 1 tablet by mouth daily.     . ONGLYZA 5 MG TABS tablet Take 5 mg by mouth.    . Oxycodone HCl 10 MG TABS Take 1 tablet (10 mg total) by mouth every 4 (four) hours as needed (for pain). 60 tablet 0  . polyethylene glycol (MIRALAX / GLYCOLAX) packet Take 17 g by  mouth daily.    . traMADol (ULTRAM) 50 MG tablet Take 1 tablet (50 mg total) by mouth every 6 (six) hours as needed for moderate pain. 90 tablet 0  . venlafaxine XR (EFFEXOR-XR) 37.5 MG 24 hr capsule Take 37.5 mg by mouth daily with breakfast.    . verapamil (VERELAN PM) 120 MG 24 hr capsule Take 1 capsule by mouth  daily 15 capsule 0   No facility-administered medications prior to visit.    PAST MEDICAL HISTORY: Past Medical History  Diagnosis Date  . HBP (high blood pressure)   . Thyroid disorder   . Migraine   . Memory loss   . Anemia   . MGUS (monoclonal gammopathy of unknown significance)   . MGUS (monoclonal gammopathy of unknown significance) 10/06/2013  . Anemia, unspecified 10/06/2013  . Bone pain 10/21/2013  . Multiple myeloma, without mention of having achieved remission 11/16/2013  . Seizure 1960    single seizure episode at age 65  . Leukopenia due to antineoplastic chemotherapy 12/27/2013  . Right leg swelling 02/07/2014  . DVT (  deep venous thrombosis) 02/07/2014  . Diverticulitis 07/18/2014  . Abnormal thyroid function test 08/30/2014  . 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 has not picked oral med up from pharmacy as of 09-14-14  . Vitamin D deficiency 10/24/2014  . Bronchitis 01/31/2015    PAST SURGICAL HISTORY: Past Surgical History  Procedure Laterality Date  . Hemorrhoid surgery      FAMILY HISTORY: Family History  Problem Relation Age of Onset  . Throat cancer Father   . Stroke Father   . Cancer Brother     prostate    SOCIAL HISTORY: History   Social History  . Marital Status: Married    Spouse Name: Arnell Sieving  . Number of Children: 2  . Years of Education: 14   Occupational History  .  [employer]   Social History Main Topics  . Smoking status: Never Smoker   . Smokeless tobacco: Never Used  . Alcohol Use: No  . Drug Use: No  . Sexual Activity: Not on file   Other Topics Concern  . Not on file   Social  History Narrative   Patient lives at home with her husband Special educational needs teacher). Patient has two years college.   Right handed.   Caffeine- None      PHYSICAL EXAM  Filed Vitals:   03/08/15 0802  BP: 127/77  Pulse: 64  Height: _0  (1.702 m)  Weight: 169 lb (76.658 kg)   Body mass index is 26.46 kg/(m^2).  Generalized: Well developed, in no acute distress   Neurological examination  Mentation: Alert oriented to time, place, history taking. Follows all commands speech and language fluent Cranial nerve II-XII: Pupils were equal round reactive to light. Extraocular movements were full, visual field were full on confrontational test. Facial sensation and strength were normal. Uvula tongue midline. Head turning and shoulder shrug  were normal and symmetric. Motor: The motor testing reveals 5 over 5 strength of all 4 extremities. Good symmetric motor tone is noted throughout.  Sensory: Sensory testing is intact to soft touch on all 4 extremities. No evidence of extinction is noted.  Coordination: Cerebellar testing reveals good finger-nose-finger and heel-to-shin bilaterally.  Gait and station: Gait is normal. Tandem gait is normal. Romberg is negative. No drift is seen.  Reflexes: Deep tendon reflexes are symmetric and normal bilaterally.     DIAGNOSTIC DATA (LABS, IMAGING, TESTING) - I reviewed patient records, labs, notes, testing and imaging myself where available.  Lab Results  Component Value Date   WBC 4.6 02/16/2015   HGB 10.8* 02/16/2015   HCT 34.6* 02/16/2015   MCV 73.2* 02/16/2015   PLT 306 02/16/2015      Component Value Date/Time   NA 141 02/16/2015 1146   K 4.0 02/16/2015 1146   CO2 24 02/16/2015 1146   GLUCOSE 117 02/16/2015 1146   BUN 10.6 02/16/2015 1146   CREATININE 0.7 02/16/2015 1146   CALCIUM 8.8 02/16/2015 1146   PROT 6.3* 02/16/2015 1146   ALBUMIN 3.1* 02/16/2015 1146   AST 15 02/16/2015 1146   ALT 37 02/16/2015 1146   ALKPHOS 55 02/16/2015 1146   BILITOT  0.31 02/16/2015 1146    Lab Results  Component Value Date   TSH 0.364 12/19/2014      ASSESSMENT AND PLAN 59 y.o. year old female  has a past medical history of HBP (high blood pressure); Thyroid disorder; Migraine; Memory loss; Anemia; MGUS (monoclonal gammopathy of unknown significance); MGUS (monoclonal gammopathy of unknown significance) (  10/06/2013); Anemia, unspecified (10/06/2013); Bone pain (10/21/2013); Multiple myeloma, without mention of having achieved remission (11/16/2013); Seizure (1960); Leukopenia due to antineoplastic chemotherapy (12/27/2013); Right leg swelling (02/07/2014); DVT (deep venous thrombosis) (02/07/2014); Diverticulitis (07/18/2014); Abnormal thyroid function test (08/30/2014); Diabetes mellitus without complication (17/6160); Vitamin D deficiency (10/24/2014); and Bronchitis (01/31/2015). here with;  1. Chronic migraine  The patient's migraine frequency has increased since her Effexor was decreased. The patient would like to increase Effexor to see if this is beneficial for her migraines. I will increase Effexor to 75 mg daily. She will continue verapamil at this time. Patient advised that if her headaches do not improve she should let us know. She will follow-up in 3-4 months or sooner if needed.  Ward Givens, MSN, NP-C 03/08/2015, 8:09 AM [facility] [address], [place], [place] [address] [phone]  Note: This document was prepared with digital dictation and possible smart phrase technology. Any transcriptional errors that result from this process are unintentional. Personally examined patient and images, and have documentes history, physical, neuro exam,assessment and plan as stated above.   Sarina Ill, MD Stroke Neurology [phone] [facility]

## 2015-03-09 ENCOUNTER — Telehealth: Payer: Self-pay

## 2015-03-09 NOTE — Telephone Encounter (Signed)
Called and spoke to patient and relayed to her her form that she paid for needs to filled out by her oncologist. Patient under stood process and she will pick up form and we will refund her $25.00.

## 2015-03-16 ENCOUNTER — Other Ambulatory Visit (HOSPITAL_COMMUNITY)
Admission: RE | Admit: 2015-03-16 | Discharge: 2015-03-16 | Disposition: A | Discharge status: Home / Self Care | Payer: 59 | Source: Ambulatory Visit | Attending: Hematology and Oncology | Admitting: Hematology and Oncology

## 2015-03-16 ENCOUNTER — Encounter: Payer: 59 | Admitting: *Deleted

## 2015-03-16 ENCOUNTER — Other Ambulatory Visit (HOSPITAL_BASED_OUTPATIENT_CLINIC_OR_DEPARTMENT_OTHER): Payer: 59

## 2015-03-16 DIAGNOSIS — R946 Abnormal results of thyroid function studies: Secondary | ICD-10-CM

## 2015-03-16 DIAGNOSIS — I82409 Acute embolism and thrombosis of unspecified deep veins of unspecified lower extremity: Secondary | ICD-10-CM | POA: Diagnosis present

## 2015-03-16 DIAGNOSIS — C9000 Multiple myeloma not having achieved remission: Secondary | ICD-10-CM

## 2015-03-16 LAB — COMPREHENSIVE METABOLIC PANEL
ALT: 30 U/L (ref 0–35)
AST: 24 U/L (ref 0–37)
Albumin: 3.9 g/dL (ref 3.5–5.2)
Alkaline Phosphatase: 54 U/L (ref 39–117)
Anion gap: 7 (ref 5–15)
BUN: 12 mg/dL (ref 6–23)
CO2: 29 mmol/L (ref 19–32)
Calcium: 9.3 mg/dL (ref 8.4–10.5)
Chloride: 106 mmol/L (ref 96–112)
Creatinine, Ser: 0.86 mg/dL (ref 0.50–1.10)
GFR calc Af Amer: 85 mL/min — ABNORMAL LOW (ref >90)
GFR calc non Af Amer: 73 mL/min — ABNORMAL LOW (ref >90)
Glucose, Bld: 89 mg/dL (ref 70–99)
Potassium: 3.6 mmol/L (ref 3.5–5.1)
Sodium: 142 mmol/L (ref 135–145)
Total Bilirubin: 0.4 mg/dL (ref 0.3–1.2)
Total Protein: 7.4 g/dL (ref 6.0–8.3)

## 2015-03-16 LAB — CBC WITH DIFFERENTIAL/PLATELET
BASO%: 1 % (ref 0.0–2.0)
Basophils Absolute: 0 10*3/uL (ref 0.0–0.1)
EOS%: 1 % (ref 0.0–7.0)
Eosinophils Absolute: 0 10*3/uL (ref 0.0–0.5)
HCT: 36.5 % (ref 34.8–46.6)
HGB: 11.5 g/dL — ABNORMAL LOW (ref 11.6–15.9)
LYMPH%: 48.5 % (ref 14.0–49.7)
MCH: 23.4 pg — ABNORMAL LOW (ref 25.1–34.0)
MCHC: 31.5 g/dL (ref 31.5–36.0)
MCV: 74.2 fL — ABNORMAL LOW (ref 79.5–101.0)
MONO#: 0.4 10*3/uL (ref 0.1–0.9)
MONO%: 13 % (ref 0.0–14.0)
NEUT#: 1.1 10*3/uL — ABNORMAL LOW (ref 1.5–6.5)
NEUT%: 36.5 % — ABNORMAL LOW (ref 38.4–76.8)
Platelets: 239 10*3/uL (ref 145–400)
RBC: 4.92 10*6/uL (ref 3.70–5.45)
RDW: 16.6 % — ABNORMAL HIGH (ref 11.2–14.5)
WBC: 3 10*3/uL — ABNORMAL LOW (ref 3.9–10.3)
lymph#: 1.5 10*3/uL (ref 0.9–3.3)
nRBC: 0 % (ref 0–0)

## 2015-03-16 LAB — TSH CHCC: TSH: 0.834 m(IU)/L (ref 0.308–3.960)

## 2015-03-16 NOTE — Progress Notes (Signed)
03/16/2015 Spoke with patient approximately 11:30 am today and instructed her to continue dosing Revlimid as lab values are within parameters for continued treatment. [name] BSN, RN, [facility] 03/16/2015 3:23 PM

## 2015-03-16 NOTE — Progress Notes (Signed)
03/16/2015 Patient in to clinic today for lab tests prior to beginning treatment Cycle 6. Met patient in lobby and retrieved March/April calendars with Revlimid dosing marked. Patient was given calendars for April and May with Cycle 6 treatment days marked. She was instructed not to begin Revlimid dosing today until notified of acceptable lab results.  Per telephone conversation with patient yesterday, she feels her fatigue is slightly better, though it remains mild (grade 1). She has had mild difficulty sleeping. She reports that her neuropathy is unchanged, affecting only her left side (same as baseline). She has had ongoing moderate headaches, which improved some after medication adjustment by her neurologist. She denies any abdominal pain, but has had mild constipation (occasional), relieved with use of stool softener. She reports no change in her bone pain, which is tolerable on the same dose of pain medication.  [name] BSN, RN, [place] 03/16/2015 8:17 AM

## 2015-03-19 ENCOUNTER — Encounter: Payer: Self-pay | Admitting: Hematology and Oncology

## 2015-03-19 MED ORDER — LENALIDOMIDE 5 MG PO CAPS CTSU E1A11: ORAL_CAPSULE | ORAL | Status: DC

## 2015-03-20 ENCOUNTER — Encounter: Payer: Self-pay | Admitting: Hematology and Oncology

## 2015-03-20 ENCOUNTER — Telehealth: Payer: Self-pay | Admitting: Hematology and Oncology

## 2015-03-20 LAB — SPEP & IFE WITH QIG
Abnormal Protein Band1: 0.4 g/dL
Albumin ELP: 3.7 g/dL — ABNORMAL LOW (ref 3.8–4.8)
Alpha-1-Globulin: 0.3 g/dL (ref 0.2–0.3)
Alpha-2-Globulin: 0.6 g/dL (ref 0.5–0.9)
Beta 2: 0.3 g/dL (ref 0.2–0.5)
Beta Globulin: 0.4 g/dL (ref 0.4–0.6)
Gamma Globulin: 1.3 g/dL (ref 0.8–1.7)
IgA: 127 mg/dL (ref 69–380)
IgG (Immunoglobin G), Serum: 1440 mg/dL (ref 690–1700)
IgM, Serum: 55 mg/dL (ref 52–322)
Total Protein, Serum Electrophoresis: 6.6 g/dL (ref 6.1–8.1)

## 2015-03-20 LAB — KAPPA/LAMBDA LIGHT CHAINS
Kappa free light chain: 2.1 mg/dL — ABNORMAL HIGH (ref 0.33–1.94)
Kappa:Lambda Ratio: 1.08 (ref 0.26–1.65)
Lambda Free Lght Chn: 1.94 mg/dL (ref 0.57–2.63)

## 2015-03-20 NOTE — Progress Notes (Signed)
I placed evaluation form on the desk of nurse for dr. Alvy Bimler.

## 2015-03-20 NOTE — Telephone Encounter (Signed)
s.w. pt and advised pt on 4.20 appt....pt ok and aware

## 2015-03-20 NOTE — Progress Notes (Signed)
03/20/2015 The following additional complaints were recorded by the patient on her medication calendar, and considered mild (grade 1) in nature except for back pain (grade 1). - fatigue - joint pain (knee) - stomachache - lightheadedness - back pain (grade 2). [name] BSN, RN, [place] 03/20/2015 10:03 AM

## 2015-03-21 ENCOUNTER — Encounter: Payer: Self-pay | Admitting: *Deleted

## 2015-03-21 ENCOUNTER — Encounter: Payer: Self-pay | Admitting: Hematology and Oncology

## 2015-03-21 ENCOUNTER — Telehealth: Payer: Self-pay | Admitting: *Deleted

## 2015-03-21 ENCOUNTER — Ambulatory Visit (HOSPITAL_BASED_OUTPATIENT_CLINIC_OR_DEPARTMENT_OTHER): Payer: 59 | Admitting: Hematology and Oncology

## 2015-03-21 VITALS — BP 125/70 | HR 80 | Temp 98.5°F | Resp 18 | Ht 67.0 in | Wt 171.5 lb

## 2015-03-21 DIAGNOSIS — C9001 Multiple myeloma in remission: Secondary | ICD-10-CM | POA: Diagnosis not present

## 2015-03-21 DIAGNOSIS — Z0271 Encounter for disability determination: Secondary | ICD-10-CM | POA: Diagnosis not present

## 2015-03-21 DIAGNOSIS — C9000 Multiple myeloma not having achieved remission: Secondary | ICD-10-CM

## 2015-03-21 NOTE — Progress Notes (Signed)
Patient came in and picked up forms from [place]. [place] made a copy for our records here.

## 2015-03-21 NOTE — Telephone Encounter (Signed)
Pt aware of appt this afternoon.

## 2015-03-21 NOTE — Telephone Encounter (Signed)
-----   Message from Heath Lark, MD sent at 03/20/2015  3:56 PM EDT ----- Regarding: disability eval I received the form for disability eval. I need to meet her face to face so I can document that accurately I can see her either tomorrow at 2pm or Monday at 1045 or 115 pm Can you call her to schedule?

## 2015-03-21 NOTE — Progress Notes (Signed)
Disability form completed by Dr. Alvy Bimler.  Copy given to [name] in managed care dept.Carla Perry pt original forms to mail along w/ last office note as she had a pre addressed, postage paid envelope.  Copy placed to scan in EMR.

## 2015-03-22 DIAGNOSIS — Z0271 Encounter for disability determination: Secondary | ICD-10-CM | POA: Insufficient documentation

## 2015-03-22 NOTE — Assessment & Plan Note (Signed)
She is doing well clinically while on treatment. Continue Revlimid at a dose adjustment.

## 2015-03-22 NOTE — Progress Notes (Signed)
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   05/05/2014 Imaging Imaging study of the neck showed no explanation that could cause right neck pain. She is noted to have incidental left upper lung nodule. Plan to repeat imaging study in 3 months.   09/06/2014 Imaging Bone survey showed no evidence of fracture   09/14/2014 Bone Marrow Biopsy Bone marrow biopsy showed 8% residual plasma cells by manual count but none on the biopsy specimen   09/26/2014 -  Chemotherapy She is started on cycle 1 of maintenance Revlimid   01/31/2015 Imaging  chest x-ray showed pneumonia. Treatment was placed on hold.    INTERVAL HISTORY: Please see below for problem oriented charting. She is in today to review disability paperwork. In the meantime, she feels well without recent infection  REVIEW OF SYSTEMS:   Constitutional: Denies fevers, chills or abnormal weight loss Eyes: Denies blurriness of vision Ears, nose, mouth, throat, and face: Denies mucositis or sore throat Respiratory: Denies cough, dyspnea or wheezes Cardiovascular: Denies palpitation, chest discomfort or lower extremity swelling Gastrointestinal:  Denies nausea,  heartburn or change in bowel habits Skin: Denies abnormal skin rashes Lymphatics: Denies new lymphadenopathy or easy bruising Neurological:Denies numbness, tingling or new weaknesses Behavioral/Psych: Mood is stable, no new changes  All other systems were reviewed with the patient and are negative.  I have reviewed the past medical history, past surgical history, social history and family history with the patient and they are unchanged from previous note.  ALLERGIES:  has No Known Allergies.  MEDICATIONS:  Current Outpatient Prescriptions  Medication Sig Dispense Refill  . acyclovir (ZOVIRAX) 400 MG tablet Take 1 tablet (400 mg total) by mouth daily. Per Dr. Alvy Bimler, continue for one year post-Velcade. 90 tablet 3  . aspirin 325 MG tablet Take 325 mg by mouth daily.    . calcium carbonate (TUMS - DOSED IN MG ELEMENTAL CALCIUM) 500 MG chewable tablet Chew 1 tablet by mouth as needed for indigestion or heartburn.    . carboxymethylcellulose (REFRESH PLUS) 0.5 % SOLN Place 1 drop into both eyes daily as needed (for dry eyes).     . Cholecalciferol (VITAMIN D3) 2000 UNITS TABS Take 2,000 Units by mouth daily.    . hydrochlorothiazide (HYDRODIURIL) 25 MG tablet Take 25 mg by mouth daily with breakfast.     . lenalidomide (REVLIMID) 5 MG capsule Take 1 capsule daily on days 1-21. Repeat every 28 days. 21 capsule 0  . levothyroxine (SYNTHROID, LEVOTHROID) 75 MCG tablet Take 75 mcg by mouth daily before breakfast.    . Multiple Vitamins-Minerals (CENTRUM SILVER PO) Take 1 tablet by mouth daily.     . ONGLYZA 5 MG TABS tablet Take 5 mg by mouth.    . Oxycodone HCl 10 MG  TABS Take 1 tablet (10 mg total) by mouth every 4 (four) hours as needed (for pain). 60 tablet 0  . polyethylene glycol (MIRALAX / GLYCOLAX) packet Take 17 g by mouth daily.    . traMADol (ULTRAM) 50 MG tablet Take 1 tablet (50 mg total) by mouth every 6 (six) hours as needed for moderate pain. 90 tablet 0  . venlafaxine XR  (EFFEXOR-XR) 75 MG 24 hr capsule Take 1 capsule (75 mg total) by mouth daily with breakfast. 30 capsule 4  . verapamil (VERELAN PM) 120 MG 24 hr capsule Take 1 capsule by mouth  daily 30 capsule 4   No current facility-administered medications for this visit.    PHYSICAL EXAMINATION: ECOG PERFORMANCE STATUS: 1 - Symptomatic but completely ambulatory  Filed Vitals:   03/21/15 1356  BP: 125/70  Pulse: 80  Temp: 98.5 F (36.9 C)  Resp: 18   Filed Weights   03/21/15 1356  Weight: 171 lb 8 oz (77.792 kg)    GENERAL:alert, no distress and comfortable SKIN: skin color, texture, turgor are normal, no rashes or significant lesions EYES: normal, Conjunctiva are pink and non-injected, sclera clear OROPHARYNX:no exudate, no erythema and lips, buccal mucosa, and tongue normal  NECK: supple, thyroid normal size, non-tender, without nodularity LYMPH:  no palpable lymphadenopathy in the cervical, axillary or inguinal LUNGS: clear to auscultation and percussion with normal breathing effort HEART: regular rate & rhythm and no murmurs and no lower extremity edema ABDOMEN:abdomen soft, non-tender and normal bowel sounds Musculoskeletal:no cyanosis of digits and no clubbing  NEURO: alert & oriented x 3 with fluent speech, no focal motor/sensory deficits  LABORATORY DATA:  I have reviewed the data as listed    Component Value Date/Time   NA 142 03/16/2015 1012   NA 141 02/16/2015 1146   K 3.6 03/16/2015 1012   K 4.0 02/16/2015 1146   CL 106 03/16/2015 1012   CO2 29 03/16/2015 1012   CO2 24 02/16/2015 1146   GLUCOSE 89 03/16/2015 1012   GLUCOSE 117 02/16/2015 1146   BUN 12 03/16/2015 1012   BUN 10.6 02/16/2015 1146   CREATININE 0.86 03/16/2015 1012   CREATININE 0.7 02/16/2015 1146   CALCIUM 9.3 03/16/2015 1012   CALCIUM 8.8 02/16/2015 1146   PROT 7.4 03/16/2015 1012   PROT 6.3* 02/16/2015 1146   ALBUMIN 3.9 03/16/2015 1012   ALBUMIN 3.1* 02/16/2015 1146   AST 24 03/16/2015 1012    AST 15 02/16/2015 1146   ALT 30 03/16/2015 1012   ALT 37 02/16/2015 1146   ALKPHOS 54 03/16/2015 1012   ALKPHOS 55 02/16/2015 1146   BILITOT 0.4 03/16/2015 1012   BILITOT 0.31 02/16/2015 1146   GFRNONAA 73* 03/16/2015 1012   GFRAA 85* 03/16/2015 1012    No results found for: SPEP, UPEP  Lab Results  Component Value Date   WBC 3.0* 03/16/2015   NEUTROABS 1.1* 03/16/2015   HGB 11.5* 03/16/2015   HCT 36.5 03/16/2015   MCV 74.2* 03/16/2015   PLT 239 03/16/2015      Chemistry      Component Value Date/Time   NA 142 03/16/2015 1012   NA 141 02/16/2015 1146   K 3.6 03/16/2015 1012   K 4.0 02/16/2015 1146   CL 106 03/16/2015 1012   CO2 29 03/16/2015 1012   CO2 24 02/16/2015 1146   BUN 12 03/16/2015 1012   BUN 10.6 02/16/2015 1146   CREATININE 0.86 03/16/2015 1012   CREATININE 0.7 02/16/2015 1146  Component Value Date/Time   CALCIUM 9.3 03/16/2015 1012   CALCIUM 8.8 02/16/2015 1146   ALKPHOS 54 03/16/2015 1012   ALKPHOS 55 02/16/2015 1146   AST 24 03/16/2015 1012   AST 15 02/16/2015 1146   ALT 30 03/16/2015 1012   ALT 37 02/16/2015 1146   BILITOT 0.4 03/16/2015 1012   BILITOT 0.31 02/16/2015 1146     ASSESSMENT & PLAN:  Multiple myeloma without remission She is doing well clinically while on treatment. Continue Revlimid at a dose adjustment.   Disability examination She has significant disability from her treatment and the disease. I spent some time going through 4 pages of disability paperwork with the patient and her husband today.    No orders of the defined types were placed in this encounter.   All questions were answered. The patient knows to call the clinic with any problems, questions or concerns. No barriers to learning was detected. I spent 15 minutes counseling the patient face to face. The total time spent in the appointment was 20 minutes and more than 50% was on counseling and review of test results     [facility], [place], MD 03/22/2015 12:23  PM

## 2015-03-22 NOTE — Assessment & Plan Note (Signed)
She has significant disability from her treatment and the disease. I spent some time going through 4 pages of disability paperwork with the patient and her husband today.

## 2015-04-03 ENCOUNTER — Other Ambulatory Visit: Payer: Self-pay | Admitting: *Deleted

## 2015-04-03 DIAGNOSIS — C9000 Multiple myeloma not having achieved remission: Secondary | ICD-10-CM

## 2015-04-03 MED ORDER — LENALIDOMIDE 5 MG PO CAPS CTSU E1A11: ORAL_CAPSULE | ORAL | Status: DC

## 2015-04-04 ENCOUNTER — Telehealth: Payer: Self-pay | Admitting: *Deleted

## 2015-04-04 NOTE — Telephone Encounter (Signed)
[facility] faxed Revlimid refill request.  Request to provider's desk/in-basket for review. 

## 2015-04-06 ENCOUNTER — Other Ambulatory Visit (HOSPITAL_BASED_OUTPATIENT_CLINIC_OR_DEPARTMENT_OTHER): Payer: 59

## 2015-04-06 ENCOUNTER — Ambulatory Visit: Payer: 59 | Admitting: Hematology and Oncology

## 2015-04-06 DIAGNOSIS — C9000 Multiple myeloma not having achieved remission: Secondary | ICD-10-CM

## 2015-04-06 LAB — COMPREHENSIVE METABOLIC PANEL (CC13)
ALT: 33 U/L (ref 0–55)
AST: 21 U/L (ref 5–34)
Albumin: 3.5 g/dL (ref 3.5–5.0)
Alkaline Phosphatase: 62 U/L (ref 40–150)
Anion Gap: 12 mEq/L — ABNORMAL HIGH (ref 3–11)
BUN: 9.6 mg/dL (ref 7.0–26.0)
CO2: 23 mEq/L (ref 22–29)
Calcium: 9.2 mg/dL (ref 8.4–10.4)
Chloride: 106 mEq/L (ref 98–109)
Creatinine: 0.8 mg/dL (ref 0.6–1.1)
EGFR: >90 mL/min/{1.73_m2} (ref >90)
Glucose: 107 mg/dl (ref 70–140)
Potassium: 3.5 mEq/L (ref 3.5–5.1)
Sodium: 141 mEq/L (ref 136–145)
Total Bilirubin: 0.53 mg/dL (ref 0.20–1.20)
Total Protein: 7.3 g/dL (ref 6.4–8.3)

## 2015-04-06 LAB — CBC WITH DIFFERENTIAL/PLATELET
BASO%: 0.4 % (ref 0.0–2.0)
Basophils Absolute: 0 10*3/uL (ref 0.0–0.1)
EOS%: 4.6 % (ref 0.0–7.0)
Eosinophils Absolute: 0.1 10*3/uL (ref 0.0–0.5)
HCT: 38.3 % (ref 34.8–46.6)
HGB: 11.9 g/dL (ref 11.6–15.9)
LYMPH%: 38.6 % (ref 14.0–49.7)
MCH: 22.7 pg — ABNORMAL LOW (ref 25.1–34.0)
MCHC: 30.9 g/dL — ABNORMAL LOW (ref 31.5–36.0)
MCV: 73.5 fL — ABNORMAL LOW (ref 79.5–101.0)
MONO#: 0.3 10*3/uL (ref 0.1–0.9)
MONO%: 11 % (ref 0.0–14.0)
NEUT#: 1.2 10*3/uL — ABNORMAL LOW (ref 1.5–6.5)
NEUT%: 45.4 % (ref 38.4–76.8)
Platelets: 232 10*3/uL (ref 145–400)
RBC: 5.22 10*6/uL (ref 3.70–5.45)
RDW: 16.3 % — ABNORMAL HIGH (ref 11.2–14.5)
WBC: 2.6 10*3/uL — ABNORMAL LOW (ref 3.9–10.3)
lymph#: 1 10*3/uL (ref 0.9–3.3)

## 2015-04-06 LAB — LACTATE DEHYDROGENASE (CC13): LDH: 189 U/L (ref 125–245)

## 2015-04-10 LAB — SPEP & IFE WITH QIG
Albumin ELP: 3.8 g/dL (ref 3.8–4.8)
Alpha-1-Globulin: 0.3 g/dL (ref 0.2–0.3)
Alpha-2-Globulin: 0.6 g/dL (ref 0.5–0.9)
Beta 2: 0.4 g/dL (ref 0.2–0.5)
Beta Globulin: 0.4 g/dL (ref 0.4–0.6)
Gamma Globulin: 1.5 g/dL (ref 0.8–1.7)
IgA: 126 mg/dL (ref 69–380)
IgG (Immunoglobin G), Serum: 1660 mg/dL (ref 690–1700)
IgM, Serum: 60 mg/dL (ref 52–322)
Total Protein, Serum Electrophoresis: 7 g/dL (ref 6.1–8.1)

## 2015-04-10 LAB — KAPPA/LAMBDA LIGHT CHAINS
Kappa free light chain: 3 mg/dL — ABNORMAL HIGH (ref 0.33–1.94)
Kappa:Lambda Ratio: 1.13 (ref 0.26–1.65)
Lambda Free Lght Chn: 2.66 mg/dL — ABNORMAL HIGH (ref 0.57–2.63)

## 2015-04-13 ENCOUNTER — Ambulatory Visit (HOSPITAL_BASED_OUTPATIENT_CLINIC_OR_DEPARTMENT_OTHER): Payer: 59 | Admitting: Hematology and Oncology

## 2015-04-13 ENCOUNTER — Telehealth: Payer: Self-pay | Admitting: Hematology and Oncology

## 2015-04-13 ENCOUNTER — Telehealth: Payer: Self-pay | Admitting: *Deleted

## 2015-04-13 ENCOUNTER — Encounter: Payer: Self-pay | Admitting: Hematology and Oncology

## 2015-04-13 ENCOUNTER — Encounter: Payer: 59 | Admitting: *Deleted

## 2015-04-13 VITALS — BP 132/71 | HR 60 | Temp 98.0°F | Resp 18 | Ht 67.0 in | Wt 170.5 lb

## 2015-04-13 DIAGNOSIS — D72819 Decreased white blood cell count, unspecified: Secondary | ICD-10-CM

## 2015-04-13 DIAGNOSIS — C9000 Multiple myeloma not having achieved remission: Secondary | ICD-10-CM

## 2015-04-13 DIAGNOSIS — T451X5A Adverse effect of antineoplastic and immunosuppressive drugs, initial encounter: Secondary | ICD-10-CM

## 2015-04-13 DIAGNOSIS — R946 Abnormal results of thyroid function studies: Secondary | ICD-10-CM

## 2015-04-13 DIAGNOSIS — M898X9 Other specified disorders of bone, unspecified site: Secondary | ICD-10-CM

## 2015-04-13 DIAGNOSIS — Z006 Encounter for examination for normal comparison and control in clinical research program: Secondary | ICD-10-CM

## 2015-04-13 DIAGNOSIS — G893 Neoplasm related pain (acute) (chronic): Secondary | ICD-10-CM | POA: Diagnosis not present

## 2015-04-13 DIAGNOSIS — D701 Agranulocytosis secondary to cancer chemotherapy: Secondary | ICD-10-CM

## 2015-04-13 MED ORDER — LENALIDOMIDE 5 MG PO CAPS CTSU E1A11: ORAL_CAPSULE | ORAL | Status: DC

## 2015-04-13 MED ORDER — OXYCODONE HCL 10 MG PO TABS: 10.0000 mg | ORAL_TABLET | ORAL | Status: DC | PRN

## 2015-04-13 NOTE — Telephone Encounter (Signed)
per pof to sch pt appt-gave pt copy of sch °

## 2015-04-13 NOTE — Telephone Encounter (Signed)
Informed pt of BMBx scheduled for Thursday 6/2 at [facility] short stay dept.Carla Perry at 7 am,  Nothing to eat or drink after midnight the night before and need a driver home.  Pt verbalized understanding.  Notified Butch Penny in [employer].

## 2015-04-13 NOTE — Progress Notes (Signed)
Patient in to clinic today for evaluation prior to beginning treatment Cycle 7. Met patient in lobby upon arrival, and Quality of Life assessments were completed independently by the patient prior to contact with healthcare providers. Retrieved April/May calendars with Revlimid dosing marked. Patient was given a calendar to document May/June doses, with Cycle 7 treatment days marked.  Patient reports that she has experienced a few days of mild diarrhea, with a maximum of 4 stools per day on those days. She has had mild insomnia and moderate fatigue. Her neuropathy is felt to be unchanged.  The following additional complaints were recorded by the patient on her medication calendar, and considered moderate in nature, pain occasionally requiring pain medication. - general pain - joint pain (multiple joints) - back pain - bone pain  She continues to have occasional moderate headaches. She had one day where she experienced mild abdominal pain, and one day where she had mild symptoms of vertigo.  Patient is aware of plans to obtain a 24-hour urine specimen, beginning Sunday 04/15/2015, additional blood tests to confirm response on 04/16/2015, and plans for a bone marrow collection to be scheduled for 05/03/2015 to confirm response and collect research samples. Patient will begin dosing for Cycle 7 today.  [name] BSN, RN, [place] 04/13/2015 2:52 PM

## 2015-04-14 NOTE — Assessment & Plan Note (Signed)
This is likely due to recent treatment. The patient denies recent history of fevers, cough, chills, diarrhea or dysuria. She is asymptomatic from the leukopenia. I will observe for now.  I will continue the chemotherapy at current dose without dosage adjustment.  If the leukopenia gets progressive worse in the future, I might have to delay her treatment or adjust the chemotherapy dose.   

## 2015-04-14 NOTE — Assessment & Plan Note (Signed)
The thyroid function test had normalized recently. We will recheck it again next month

## 2015-04-14 NOTE — Assessment & Plan Note (Signed)
She continues to have intermittent, diffuse bone pain. I refilled her prescription for pain

## 2015-04-14 NOTE — Progress Notes (Signed)
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   05/05/2014 Imaging Imaging study of the neck showed no explanation that could cause right neck pain. She is noted to have incidental left upper lung nodule. Plan to repeat imaging study in 3 months.   09/06/2014 Imaging Bone survey showed no evidence of fracture   09/14/2014 Bone Marrow Biopsy Bone marrow biopsy showed 8% residual plasma cells by manual count but none on the biopsy specimen   09/26/2014 -  Chemotherapy She is started on cycle 1 of maintenance Revlimid   01/31/2015 Imaging  chest x-ray showed pneumonia. Treatment was placed on hold.    INTERVAL HISTORY: Please see below for problem oriented charting. She is seen today for toxicity review while on treatment She continues to have intermittent bone pain on pain medications. Denies recent infection No recent diarrhea She has mild fatigue  REVIEW OF SYSTEMS:   Constitutional: Denies fevers, chills or abnormal weight loss Eyes: Denies blurriness of vision Ears, nose, mouth, throat, and face: Denies mucositis or sore throat Respiratory: Denies cough, dyspnea or wheezes Cardiovascular: Denies  palpitation, chest discomfort or lower extremity swelling Gastrointestinal:  Denies nausea, heartburn or change in bowel habits Skin: Denies abnormal skin rashes Lymphatics: Denies new lymphadenopathy or easy bruising Neurological:Denies numbness, tingling or new weaknesses Behavioral/Psych: Mood is stable, no new changes  All other systems were reviewed with the patient and are negative.  I have reviewed the past medical history, past surgical history, social history and family history with the patient and they are unchanged from previous note.  ALLERGIES:  has No Known Allergies.  MEDICATIONS:  Current Outpatient Prescriptions  Medication Sig Dispense Refill  . acyclovir (ZOVIRAX) 400 MG tablet Take 1 tablet (400 mg total) by mouth daily. Per Dr. Alvy Bimler, continue for one year post-Velcade. 90 tablet 3  . aspirin 325 MG tablet Take 325 mg by mouth daily.    . calcium carbonate (TUMS - DOSED IN MG ELEMENTAL CALCIUM) 500 MG chewable tablet Chew 1 tablet by mouth as needed for indigestion or heartburn.    . carboxymethylcellulose (REFRESH PLUS) 0.5 % SOLN Place 1 drop into both eyes daily as needed (for dry eyes).     . Cholecalciferol (VITAMIN D3) 2000 UNITS TABS Take 2,000 Units by mouth daily.    . hydrochlorothiazide (HYDRODIURIL) 25 MG tablet Take 25 mg by mouth daily with breakfast.     . lenalidomide (REVLIMID) 5 MG capsule Take 1 capsule daily on days 1-21. Repeat every 28 days. 21 capsule 0  . levothyroxine (SYNTHROID, LEVOTHROID) 75 MCG tablet Take 75 mcg by mouth daily before breakfast.    . Multiple Vitamins-Minerals (CENTRUM SILVER PO) Take 1 tablet by mouth daily.     . ONGLYZA 5 MG TABS tablet  Take 5 mg by mouth.    . Oxycodone HCl 10 MG TABS Take 1 tablet (10 mg total) by mouth every 4 (four) hours as needed (for pain). 60 tablet 0  . polyethylene glycol (MIRALAX / GLYCOLAX) packet Take 17 g by mouth daily.    . traMADol (ULTRAM) 50 MG tablet Take 1 tablet (50 mg total) by  mouth every 6 (six) hours as needed for moderate pain. 90 tablet 0  . venlafaxine XR (EFFEXOR-XR) 75 MG 24 hr capsule Take 1 capsule (75 mg total) by mouth daily with breakfast. 30 capsule 4  . verapamil (VERELAN PM) 120 MG 24 hr capsule Take 1 capsule by mouth  daily 30 capsule 4  . lenalidomide (REVLIMID) 5 MG capsule Take 1 capsule daily on days 1-21. Repeat every 28 days. 21 capsule 0   No current facility-administered medications for this visit.    PHYSICAL EXAMINATION: ECOG PERFORMANCE STATUS: 1 - Symptomatic but completely ambulatory  Filed Vitals:   04/13/15 0954  BP: 132/71  Pulse: 60  Temp: 98 F (36.7 C)  Resp: 18   Filed Weights   04/13/15 0954  Weight: 170 lb 8 oz (77.338 kg)    GENERAL:alert, no distress and comfortable SKIN: skin color, texture, turgor are normal, no rashes or significant lesions EYES: normal, Conjunctiva are pink and non-injected, sclera clear OROPHARYNX:no exudate, no erythema and lips, buccal mucosa, and tongue normal  NECK: supple, thyroid normal size, non-tender, without nodularity LYMPH:  no palpable lymphadenopathy in the cervical, axillary or inguinal LUNGS: clear to auscultation and percussion with normal breathing effort HEART: regular rate & rhythm and no murmurs and no lower extremity edema ABDOMEN:abdomen soft, non-tender and normal bowel sounds Musculoskeletal:no cyanosis of digits and no clubbing  NEURO: alert & oriented x 3 with fluent speech, no focal motor/sensory deficits  LABORATORY DATA:  I have reviewed the data as listed    Component Value Date/Time   NA 141 04/06/2015 0958   NA 142 03/16/2015 1012   K 3.5 04/06/2015 0958   K 3.6 03/16/2015 1012   CL 106 03/16/2015 1012   CO2 23 04/06/2015 0958   CO2 29 03/16/2015 1012   GLUCOSE 107 04/06/2015 0958   GLUCOSE 89 03/16/2015 1012   BUN 9.6 04/06/2015 0958   BUN 12 03/16/2015 1012   CREATININE 0.8 04/06/2015 0958   CREATININE 0.86 03/16/2015 1012   CALCIUM 9.2  04/06/2015 0958   CALCIUM 9.3 03/16/2015 1012   PROT 7.3 04/06/2015 0958   PROT 7.4 03/16/2015 1012   ALBUMIN 3.5 04/06/2015 0958   ALBUMIN 3.9 03/16/2015 1012   AST 21 04/06/2015 0958   AST 24 03/16/2015 1012   ALT 33 04/06/2015 0958   ALT 30 03/16/2015 1012   ALKPHOS 62 04/06/2015 0958   ALKPHOS 54 03/16/2015 1012   BILITOT 0.53 04/06/2015 0958   BILITOT 0.4 03/16/2015 1012   GFRNONAA 73* 03/16/2015 1012   GFRAA 85* 03/16/2015 1012    No results found for: SPEP, UPEP  Lab Results  Component Value Date   WBC 2.6* 04/06/2015   NEUTROABS 1.2* 04/06/2015   HGB 11.9 04/06/2015   HCT 38.3 04/06/2015   MCV 73.5* 04/06/2015   PLT 232 04/06/2015      Chemistry      Component Value Date/Time   NA 141 04/06/2015 0958   NA 142 03/16/2015 1012   K 3.5 04/06/2015 0958   K 3.6 03/16/2015 1012   CL 106 03/16/2015 1012   CO2 23  04/06/2015 0958   CO2 29 03/16/2015 1012   BUN 9.6 04/06/2015 0958   BUN 12 03/16/2015 1012   CREATININE 0.8 04/06/2015 0958   CREATININE 0.86 03/16/2015 1012      Component Value Date/Time   CALCIUM 9.2 04/06/2015 0958   CALCIUM 9.3 03/16/2015 1012   ALKPHOS 62 04/06/2015 0958   ALKPHOS 54 03/16/2015 1012   AST 21 04/06/2015 0958   AST 24 03/16/2015 1012   ALT 33 04/06/2015 0958   ALT 30 03/16/2015 1012   BILITOT 0.53 04/06/2015 0958   BILITOT 0.4 03/16/2015 1012       RADIOGRAPHIC STUDIES: I have personally reviewed the radiological images as listed and agreed with the findings in the report. No results found.   ASSESSMENT & PLAN:  Multiple myeloma without remission She is doing well clinically while on treatment. Continue Revlimid at a dose adjustment. To complete her recent work-up to confirm remission, I will order 24 hours urine collection and bone marrow biopsy      Leukopenia due to antineoplastic chemotherapy This is likely due to recent treatment. The patient denies recent history of fevers, cough, chills, diarrhea or  dysuria. She is asymptomatic from the leukopenia. I will observe for now.  I will continue the chemotherapy at current dose without dosage adjustment.  If the leukopenia gets progressive worse in the future, I might have to delay her treatment or adjust the chemotherapy dose.      Abnormal thyroid function test The thyroid function test had normalized recently. We will recheck it again next month      Malignant bone pain She continues to have intermittent, diffuse bone pain. I refilled her prescription for pain     Orders Placed This Encounter  Procedures  . Comprehensive metabolic panel    Standing Status: Future     Number of Occurrences:      Standing Expiration Date: 05/17/2016  . SPEP & IFE with QIG    Standing Status: Future     Number of Occurrences:      Standing Expiration Date: 05/17/2016  . Immunofixation interpretive, urine    Standing Status: Future     Number of Occurrences:      Standing Expiration Date: 05/17/2016  . Kappa/lambda light chains    Standing Status: Future     Number of Occurrences:      Standing Expiration Date: 05/17/2016  . Protein Electro, 24-Hour Urine    Standing Status: Future     Number of Occurrences:      Standing Expiration Date: 05/17/2016  . IFE, Urine (with Tot Prot)    Standing Status: Future     Number of Occurrences:      Standing Expiration Date: 05/17/2016  . CBC with Differential/Platelet    Standing Status: Future     Number of Occurrences:      Standing Expiration Date: 05/17/2016   All questions were answered. The patient knows to call the clinic with any problems, questions or concerns. No barriers to learning was detected. I spent 25 minutes counseling the patient face to face. The total time spent in the appointment was 30 minutes and more than 50% was on counseling and review of test results     [facility], [place], MD 04/14/2015 8:40 PM

## 2015-04-14 NOTE — Assessment & Plan Note (Signed)
She is doing well clinically while on treatment. Continue Revlimid at a dose adjustment. To complete her recent work-up to confirm remission, I will order 24 hours urine collection and bone marrow biopsy

## 2015-04-16 ENCOUNTER — Other Ambulatory Visit (HOSPITAL_BASED_OUTPATIENT_CLINIC_OR_DEPARTMENT_OTHER): Payer: 59

## 2015-04-16 DIAGNOSIS — C9000 Multiple myeloma not having achieved remission: Secondary | ICD-10-CM

## 2015-04-16 DIAGNOSIS — C9001 Multiple myeloma in remission: Secondary | ICD-10-CM

## 2015-04-16 LAB — CBC WITH DIFFERENTIAL/PLATELET
BASO%: 0.7 % (ref 0.0–2.0)
Basophils Absolute: 0 10*3/uL (ref 0.0–0.1)
EOS%: 4.5 % (ref 0.0–7.0)
Eosinophils Absolute: 0.1 10*3/uL (ref 0.0–0.5)
HCT: 38.9 % (ref 34.8–46.6)
HGB: 12.2 g/dL (ref 11.6–15.9)
LYMPH%: 41.2 % (ref 14.0–49.7)
MCH: 23.3 pg — ABNORMAL LOW (ref 25.1–34.0)
MCHC: 31.4 g/dL — ABNORMAL LOW (ref 31.5–36.0)
MCV: 74.2 fL — ABNORMAL LOW (ref 79.5–101.0)
MONO#: 0.5 10*3/uL (ref 0.1–0.9)
MONO%: 17.5 % — ABNORMAL HIGH (ref 0.0–14.0)
NEUT#: 1.1 10*3/uL — ABNORMAL LOW (ref 1.5–6.5)
NEUT%: 36.1 % — ABNORMAL LOW (ref 38.4–76.8)
Platelets: 246 10*3/uL (ref 145–400)
RBC: 5.24 10*6/uL (ref 3.70–5.45)
RDW: 15.9 % — ABNORMAL HIGH (ref 11.2–14.5)
WBC: 2.9 10*3/uL — ABNORMAL LOW (ref 3.9–10.3)
lymph#: 1.2 10*3/uL (ref 0.9–3.3)

## 2015-04-16 LAB — COMPREHENSIVE METABOLIC PANEL (CC13)
ALT: 28 U/L (ref 0–55)
AST: 23 U/L (ref 5–34)
Albumin: 3.5 g/dL (ref 3.5–5.0)
Alkaline Phosphatase: 57 U/L (ref 40–150)
Anion Gap: 10 mEq/L (ref 3–11)
BUN: 8.6 mg/dL (ref 7.0–26.0)
CO2: 26 mEq/L (ref 22–29)
Calcium: 9.6 mg/dL (ref 8.4–10.4)
Chloride: 105 mEq/L (ref 98–109)
Creatinine: 0.8 mg/dL (ref 0.6–1.1)
EGFR: >90 mL/min/{1.73_m2} (ref >90)
Glucose: 95 mg/dl (ref 70–140)
Potassium: 3.7 mEq/L (ref 3.5–5.1)
Sodium: 141 mEq/L (ref 136–145)
Total Bilirubin: 0.41 mg/dL (ref 0.20–1.20)
Total Protein: 7.2 g/dL (ref 6.4–8.3)

## 2015-04-18 LAB — SPEP & IFE WITH QIG
Albumin ELP: 3.9 g/dL (ref 3.8–4.8)
Alpha-1-Globulin: 0.3 g/dL (ref 0.2–0.3)
Alpha-2-Globulin: 0.7 g/dL (ref 0.5–0.9)
Beta 2: 0.4 g/dL (ref 0.2–0.5)
Beta Globulin: 0.5 g/dL (ref 0.4–0.6)
Gamma Globulin: 1.6 g/dL (ref 0.8–1.7)
IgA: 130 mg/dL (ref 69–380)
IgG (Immunoglobin G), Serum: 1700 mg/dL (ref 690–1700)
IgM, Serum: 59 mg/dL (ref 52–322)
Total Protein, Serum Electrophoresis: 7.3 g/dL (ref 6.1–8.1)

## 2015-04-18 LAB — KAPPA/LAMBDA LIGHT CHAINS
Kappa free light chain: 2.6 mg/dL — ABNORMAL HIGH (ref 0.33–1.94)
Kappa:Lambda Ratio: 1.15 (ref 0.26–1.65)
Lambda Free Lght Chn: 2.26 mg/dL (ref 0.57–2.63)

## 2015-04-20 LAB — UIFE/LIGHT CHAINS/TP QN, 24-HR UR
Albumin, U: DETECTED
Alpha 1, Urine: DETECTED — AB
Alpha 2, Urine: DETECTED — AB
Beta, Urine: DETECTED — AB
Gamma Globulin, Urine: DETECTED — AB
Time: 24 hours
Total Protein, Urine: <4 mg/dL — ABNORMAL LOW (ref 5–24)
Volume, Urine: 2800 mL

## 2015-04-20 LAB — UPEP/TP, 24-HR URINE
Collection Interval: 24 hours
Total Protein, Urine: <4 mg/dL
Total Volume, Urine: 2800 mL

## 2015-04-20 LAB — 24 HR URINE,KAPPA/LAMBDA LIGHT CHAINS
24H Urine Volume: 2800 mL/24 h
Measured Kappa Chain: <0.4 mg/dL (ref <2.00)
Measured Lambda Chain: <0.4 mg/dL (ref <2.00)

## 2015-05-01 ENCOUNTER — Other Ambulatory Visit: Payer: Self-pay | Admitting: Hematology and Oncology

## 2015-05-01 DIAGNOSIS — C9000 Multiple myeloma not having achieved remission: Secondary | ICD-10-CM

## 2015-05-01 NOTE — Addendum Note (Signed)
Addended by: Kyra Leyland E on: 05/01/2015 05:05 PM   Modules accepted: Orders

## 2015-05-02 ENCOUNTER — Other Ambulatory Visit: Payer: Self-pay | Admitting: *Deleted

## 2015-05-02 DIAGNOSIS — C9000 Multiple myeloma not having achieved remission: Secondary | ICD-10-CM

## 2015-05-02 MED ORDER — LENALIDOMIDE 5 MG PO CAPS CTSU E1A11: ORAL_CAPSULE | ORAL | Status: DC

## 2015-05-03 ENCOUNTER — Encounter: Payer: Self-pay | Admitting: *Deleted

## 2015-05-03 ENCOUNTER — Ambulatory Visit (HOSPITAL_COMMUNITY)
Admission: RE | Admit: 2015-05-03 | Discharge: 2015-05-03 | Disposition: A | Discharge status: Home / Self Care | Payer: 59 | Source: Ambulatory Visit | Attending: Hematology and Oncology | Admitting: Hematology and Oncology

## 2015-05-03 ENCOUNTER — Encounter (HOSPITAL_COMMUNITY): Payer: Self-pay

## 2015-05-03 ENCOUNTER — Other Ambulatory Visit (HOSPITAL_COMMUNITY): Payer: 59

## 2015-05-03 VITALS — BP 118/68 | HR 55 | Temp 97.7°F | Resp 16 | Ht 67.0 in | Wt 170.0 lb

## 2015-05-03 DIAGNOSIS — C9000 Multiple myeloma not having achieved remission: Secondary | ICD-10-CM | POA: Diagnosis not present

## 2015-05-03 DIAGNOSIS — D72819 Decreased white blood cell count, unspecified: Secondary | ICD-10-CM | POA: Insufficient documentation

## 2015-05-03 DIAGNOSIS — D509 Iron deficiency anemia, unspecified: Secondary | ICD-10-CM | POA: Diagnosis not present

## 2015-05-03 LAB — CBC
HCT: 35.2 % — ABNORMAL LOW (ref 36.0–46.0)
Hemoglobin: 10.7 g/dL — ABNORMAL LOW (ref 12.0–15.0)
MCH: 22.3 pg — ABNORMAL LOW (ref 26.0–34.0)
MCHC: 30.4 g/dL (ref 30.0–36.0)
MCV: 73.3 fL — ABNORMAL LOW (ref 78.0–100.0)
Platelets: 208 10*3/uL (ref 150–400)
RBC: 4.8 MIL/uL (ref 3.87–5.11)
RDW: 15.6 % — ABNORMAL HIGH (ref 11.5–15.5)
WBC: 2.8 10*3/uL — ABNORMAL LOW (ref 4.0–10.5)

## 2015-05-03 LAB — GLUCOSE, CAPILLARY
Glucose-Capillary: 77 mg/dL (ref 65–99)
Glucose-Capillary: 80 mg/dL (ref 65–99)

## 2015-05-03 LAB — BONE MARROW EXAM: Bone Marrow Exam: 389

## 2015-05-03 MED ORDER — FENTANYL CITRATE (PF) 100 MCG/2ML IJ SOLN
100.0000 ug | Freq: Once | INTRAMUSCULAR | Status: DC
  Filled 2015-05-03: qty 2

## 2015-05-03 MED ORDER — MIDAZOLAM HCL 5 MG/5ML IJ SOLN
INTRAMUSCULAR | Status: AC | PRN
  Administered 2015-05-03: 2.5 mg via INTRAVENOUS
  Administered 2015-05-03: 1 mg via INTRAVENOUS
  Administered 2015-05-03: 0.5 mg via INTRAVENOUS
  Administered 2015-05-03: 1 mg via INTRAVENOUS

## 2015-05-03 MED ORDER — SODIUM CHLORIDE 0.9 % IV SOLN
INTRAVENOUS | Status: DC
  Administered 2015-05-03: 08:00:00 via INTRAVENOUS

## 2015-05-03 MED ORDER — MIDAZOLAM HCL 10 MG/2ML IJ SOLN
10.0000 mg | Freq: Once | INTRAMUSCULAR | Status: DC
  Filled 2015-05-03: qty 2

## 2015-05-03 MED ORDER — FENTANYL CITRATE (PF) 100 MCG/2ML IJ SOLN
INTRAMUSCULAR | Status: AC | PRN
  Administered 2015-05-03 (×2): 25 ug via INTRAVENOUS

## 2015-05-03 MED ORDER — SODIUM CHLORIDE 0.9 % IV SOLN: INTRAVENOUS | Status: DC

## 2015-05-03 NOTE — Discharge Instructions (Signed)

## 2015-05-03 NOTE — Sedation Documentation (Signed)
Bandaide at [place] site is C, D, I

## 2015-05-03 NOTE — Sedation Documentation (Signed)
Patient rolled laterally on to right side Bx site c, d, I

## 2015-05-03 NOTE — Sedation Documentation (Signed)
Family updated as to patient's status.

## 2015-05-03 NOTE — Sedation Documentation (Signed)
MD at bedside. 

## 2015-05-03 NOTE — Procedures (Signed)
Brief examination was performed. ENT: adequate airway clearance Heart: regular rate and rhythm.No Murmurs Lungs: clear to auscultation, no wheezes, normal respiratory effort  American Society of Anesthesiologists ASA scale 2  Mallampati Score of 2  Bone Marrow Biopsy and Aspiration Procedure Note   Informed consent was obtained and potential risks including bleeding, infection and pain were reviewed with the patient. I verified that the patient has been fasting since midnight.  The patient's name, date of birth, identification, consent and allergies were verified prior to the start of procedure and time out was performed.  A total of 5 mg of IV Versed and 50 mcg of IV fentanyl were given.  The right posterior iliac crest was chosen as the site of biopsy.  The skin was prepped with Betadine solution.   8 cc of 1% lidocaine was used to provide local anaesthesia.   10 cc of bone marrow aspirate was obtained followed by 1 inch biopsy.   The procedure was tolerated well and there were no complications.  The patient was stable at the end of the procedure.  Specimens sent for flow cytometry, cytogenetics and additional studies.  

## 2015-05-03 NOTE — Sedation Documentation (Signed)
Procedure complete.

## 2015-05-03 NOTE — Sedation Documentation (Signed)
Patient is resting comfortably. Bx site clean, dry and intact

## 2015-05-11 ENCOUNTER — Encounter: Payer: Self-pay | Admitting: Hematology and Oncology

## 2015-05-11 ENCOUNTER — Telehealth: Payer: Self-pay | Admitting: Hematology and Oncology

## 2015-05-11 ENCOUNTER — Encounter: Payer: 59 | Admitting: *Deleted

## 2015-05-11 ENCOUNTER — Other Ambulatory Visit (HOSPITAL_BASED_OUTPATIENT_CLINIC_OR_DEPARTMENT_OTHER): Payer: 59

## 2015-05-11 ENCOUNTER — Ambulatory Visit (HOSPITAL_BASED_OUTPATIENT_CLINIC_OR_DEPARTMENT_OTHER): Payer: 59 | Admitting: Hematology and Oncology

## 2015-05-11 VITALS — BP 133/75 | HR 63 | Temp 98.2°F | Resp 18 | Ht 67.0 in | Wt 172.2 lb

## 2015-05-11 DIAGNOSIS — C9000 Multiple myeloma not having achieved remission: Secondary | ICD-10-CM | POA: Diagnosis not present

## 2015-05-11 DIAGNOSIS — D701 Agranulocytosis secondary to cancer chemotherapy: Secondary | ICD-10-CM

## 2015-05-11 DIAGNOSIS — Z006 Encounter for examination for normal comparison and control in clinical research program: Secondary | ICD-10-CM | POA: Diagnosis not present

## 2015-05-11 DIAGNOSIS — D630 Anemia in neoplastic disease: Secondary | ICD-10-CM | POA: Diagnosis not present

## 2015-05-11 DIAGNOSIS — T451X5A Adverse effect of antineoplastic and immunosuppressive drugs, initial encounter: Secondary | ICD-10-CM

## 2015-05-11 LAB — CBC WITH DIFFERENTIAL/PLATELET
BASO%: 0.3 % (ref 0.0–2.0)
Basophils Absolute: 0 10*3/uL (ref 0.0–0.1)
EOS%: 1.1 % (ref 0.0–7.0)
Eosinophils Absolute: 0 10*3/uL (ref 0.0–0.5)
HCT: 35.1 % (ref 34.8–46.6)
HGB: 10.9 g/dL — ABNORMAL LOW (ref 11.6–15.9)
LYMPH%: 55.8 % — ABNORMAL HIGH (ref 14.0–49.7)
MCH: 22.5 pg — ABNORMAL LOW (ref 25.1–34.0)
MCHC: 31 g/dL — ABNORMAL LOW (ref 31.5–36.0)
MCV: 72.4 fL — ABNORMAL LOW (ref 79.5–101.0)
MONO#: 0.4 10*3/uL (ref 0.1–0.9)
MONO%: 12.4 % (ref 0.0–14.0)
NEUT#: 1 10*3/uL — ABNORMAL LOW (ref 1.5–6.5)
NEUT%: 30.4 % — ABNORMAL LOW (ref 38.4–76.8)
Platelets: 225 10*3/uL (ref 145–400)
RBC: 4.86 10*6/uL (ref 3.70–5.45)
RDW: 15.9 % — ABNORMAL HIGH (ref 11.2–14.5)
WBC: 3.3 10*3/uL — ABNORMAL LOW (ref 3.9–10.3)
lymph#: 1.8 10*3/uL (ref 0.9–3.3)

## 2015-05-11 LAB — COMPREHENSIVE METABOLIC PANEL (CC13)
ALT: 24 U/L (ref 0–55)
AST: 21 U/L (ref 5–34)
Albumin: 3.3 g/dL — ABNORMAL LOW (ref 3.5–5.0)
Alkaline Phosphatase: 53 U/L (ref 40–150)
Anion Gap: 8 mEq/L (ref 3–11)
BUN: 12.1 mg/dL (ref 7.0–26.0)
CO2: 24 mEq/L (ref 22–29)
Calcium: 9.1 mg/dL (ref 8.4–10.4)
Chloride: 108 mEq/L (ref 98–109)
Creatinine: 0.8 mg/dL (ref 0.6–1.1)
EGFR: >90 mL/min/{1.73_m2} (ref >90)
Glucose: 114 mg/dl (ref 70–140)
Potassium: 3.7 mEq/L (ref 3.5–5.1)
Sodium: 140 mEq/L (ref 136–145)
Total Bilirubin: 0.47 mg/dL (ref 0.20–1.20)
Total Protein: 7 g/dL (ref 6.4–8.3)

## 2015-05-11 MED ORDER — LENALIDOMIDE 5 MG PO CAPS CTSU E1A11: ORAL_CAPSULE | ORAL | Status: DC

## 2015-05-11 NOTE — Assessment & Plan Note (Signed)
She is doing well clinically while on treatment. Continue Revlimid at a dose adjustment. Recent bone marrow aspirate and biopsy from 05/03/2015 show residual plasma cells and the patient remained in [facility] I will see her per protocol in 2 months

## 2015-05-11 NOTE — Telephone Encounter (Signed)
Gave patient avs report and appointments for July and August.  °

## 2015-05-11 NOTE — Progress Notes (Signed)
05/11/2015 Patient in to clinic today for evaluation prior to beginning treatment Cycle 8. Retrieved May/June calendar with Revlimid dosing marked. Patient was given a calendar to document June doses, with Cycle 8 treatment days marked. Patient reports that she has experienced a one of mild diarrhea, with a maximum of 3 stools. She has had mild insomnia and moderate fatigue. Her neuropathy is felt to be unchanged. Per review of her medication calendar, patient reports ongoing moderate (grade 2) back pain, joint pain, leg pain, bone pain and generalized pain. She had a couple of days of abdominal pain., which was moderate in nature. She continues to have moderate (grade 2) migraine headaches intermittently.  Based on lab results review and history and physical by Dr. Alvy Bimler, patient condition is felt to be acceptable for continued treatment. [name] BSN, RN, [place] 05/11/2015 2:47 PM

## 2015-05-11 NOTE — Assessment & Plan Note (Signed)
This is likely due to recent treatment. The patient denies recent history of bleeding such as epistaxis, hematuria or hematochezia. She is asymptomatic from the anemia. I will observe for now.   

## 2015-05-11 NOTE — Progress Notes (Signed)
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   05/05/2014 Imaging Imaging study of the neck showed no explanation that could cause right neck pain. She is noted to have incidental left upper lung nodule. Plan to repeat imaging study in 3 months.   09/06/2014 Imaging Bone survey showed no evidence of fracture   09/14/2014 Bone Marrow Biopsy Bone marrow biopsy showed 8% residual plasma cells by manual count but none on the biopsy specimen   09/26/2014 -  Chemotherapy She is started on cycle 1 of maintenance Revlimid   01/31/2015 Imaging  chest x-ray showed pneumonia. Treatment was placed on hold.   05/03/2015 Bone Marrow Biopsy Accession: [id number] repeat bone marrow aspirate and biopsy show 5% residual plasma cells    INTERVAL HISTORY: Please see below for problem oriented charting. She returns for further follow-up. She feels well. Denies recent infection. Denies chest pain or shortness of breath.   REVIEW OF SYSTEMS:   Constitutional: Denies fevers, chills or abnormal weight loss Eyes: Denies blurriness of vision Ears, nose, mouth, throat, and face: Denies mucositis or sore throat Respiratory: Denies  cough, dyspnea or wheezes Cardiovascular: Denies palpitation, chest discomfort or lower extremity swelling Gastrointestinal:  Denies nausea, heartburn or change in bowel habits Skin: Denies abnormal skin rashes Lymphatics: Denies new lymphadenopathy or easy bruising Neurological:Denies numbness, tingling or new weaknesses Behavioral/Psych: Mood is stable, no new changes  All other systems were reviewed with the patient and are negative.  I have reviewed the past medical history, past surgical history, social history and family history with the patient and they are unchanged from previous note.  ALLERGIES:  has No Known Allergies.  MEDICATIONS:  Current Outpatient Prescriptions  Medication Sig Dispense Refill  . aspirin 325 MG tablet Take 325 mg by mouth daily.    . carboxymethylcellulose (REFRESH PLUS) 0.5 % SOLN Place 1 drop into both eyes daily as needed (for dry eyes).     . Cholecalciferol (VITAMIN D3) 2000 UNITS TABS Take 2,000 Units by mouth daily.    . hydrochlorothiazide (HYDRODIURIL) 25 MG tablet Take 25 mg by mouth daily with breakfast.     . lenalidomide (REVLIMID) 5 MG capsule Take 1 capsule daily on days 1-21. Repeat every 28 days. 21 capsule 0  . lenalidomide (REVLIMID) 5 MG capsule Take 1 capsule daily on days 1-21. Repeat every 28 days. (Patient not taking: Reported on 05/03/2015) 21 capsule 0  . levothyroxine (SYNTHROID, LEVOTHROID) 75 MCG tablet Take 75 mcg by mouth daily before breakfast.    . Multiple Vitamins-Minerals (CENTRUM SILVER PO) Take 1 tablet by mouth daily.     . ONGLYZA 5 MG TABS tablet Take 5 mg by mouth.    . Oxycodone HCl 10 MG TABS Take 1 tablet (10 mg total)   by mouth every 4 (four) hours as needed (for pain). 60 tablet 0  . polyethylene glycol (MIRALAX / GLYCOLAX) packet Take 17 g by mouth daily.    . traMADol (ULTRAM) 50 MG tablet Take 1 tablet (50 mg total) by mouth every 6 (six) hours as needed for moderate pain. 90 tablet 0  . venlafaxine XR  (EFFEXOR-XR) 75 MG 24 hr capsule Take 1 capsule (75 mg total) by mouth daily with breakfast. 30 capsule 4  . verapamil (VERELAN PM) 120 MG 24 hr capsule Take 1 capsule by mouth  daily (Patient taking differently: Take 120 mg by mouth daily. ) 30 capsule 4   No current facility-administered medications for this visit.    PHYSICAL EXAMINATION: ECOG PERFORMANCE STATUS: 0 - Asymptomatic  Filed Vitals:   05/11/15 1323  BP: 133/75  Pulse: 63  Temp: 98.2 F (36.8 C)  Resp: 18   Filed Weights   05/11/15 1323  Weight: 172 lb 3.2 oz (78.109 kg)    GENERAL:alert, no distress and comfortable SKIN: skin color, texture, turgor are normal, no rashes or significant lesions EYES: normal, Conjunctiva are pink and non-injected, sclera clear OROPHARYNX:no exudate, no erythema and lips, buccal mucosa, and tongue normal  NECK: supple, thyroid normal size, non-tender, without nodularity LYMPH:  no palpable lymphadenopathy in the cervical, axillary or inguinal LUNGS: clear to auscultation and percussion with normal breathing effort HEART: regular rate & rhythm and no murmurs and no lower extremity edema ABDOMEN:abdomen soft, non-tender and normal bowel sounds Musculoskeletal:no cyanosis of digits and no clubbing  NEURO: alert & oriented x 3 with fluent speech, no focal motor/sensory deficits  LABORATORY DATA:  I have reviewed the data as listed    Component Value Date/Time   NA 141 04/16/2015 0755   NA 142 03/16/2015 1012   K 3.7 04/16/2015 0755   K 3.6 03/16/2015 1012   CL 106 03/16/2015 1012   CO2 26 04/16/2015 0755   CO2 29 03/16/2015 1012   GLUCOSE 95 04/16/2015 0755   GLUCOSE 89 03/16/2015 1012   BUN 8.6 04/16/2015 0755   BUN 12 03/16/2015 1012   CREATININE 0.8 04/16/2015 0755   CREATININE 0.86 03/16/2015 1012   CALCIUM 9.6 04/16/2015 0755   CALCIUM 9.3 03/16/2015 1012   PROT 7.2 04/16/2015 0755   PROT 7.4 03/16/2015 1012   ALBUMIN 3.5 04/16/2015 0755   ALBUMIN 3.9 03/16/2015  1012   AST 23 04/16/2015 0755   AST 24 03/16/2015 1012   ALT 28 04/16/2015 0755   ALT 30 03/16/2015 1012   ALKPHOS 57 04/16/2015 0755   ALKPHOS 54 03/16/2015 1012   BILITOT 0.41 04/16/2015 0755   BILITOT 0.4 03/16/2015 1012   GFRNONAA 73* 03/16/2015 1012   GFRAA 85* 03/16/2015 1012    No results found for: SPEP, UPEP  Lab Results  Component Value Date   WBC 3.3* 05/11/2015   NEUTROABS 1.0* 05/11/2015   HGB 10.9* 05/11/2015   HCT 35.1 05/11/2015   MCV 72.4* 05/11/2015   PLT 225 05/11/2015      Chemistry      Component Value Date/Time   NA 141 04/16/2015 0755   NA 142 03/16/2015 1012   K 3.7 04/16/2015 0755   K 3.6 03/16/2015 1012   CL 106 03/16/2015 1012   CO2 26 04/16/2015 0755   CO2 29 03/16/2015 1012   BUN 8.6 04/16/2015 0755   BUN 12 03/16/2015 1012   CREATININE 0.8 04/16/2015 0755   CREATININE 0.86 03/16/2015 1012  Component Value Date/Time   CALCIUM 9.6 04/16/2015 0755   CALCIUM 9.3 03/16/2015 1012   ALKPHOS 57 04/16/2015 0755   ALKPHOS 54 03/16/2015 1012   AST 23 04/16/2015 0755   AST 24 03/16/2015 1012   ALT 28 04/16/2015 0755   ALT 30 03/16/2015 1012   BILITOT 0.41 04/16/2015 0755   BILITOT 0.4 03/16/2015 1012     ASSESSMENT & PLAN:  Multiple myeloma without remission She is doing well clinically while on treatment. Continue Revlimid at a dose adjustment. Recent bone marrow aspirate and biopsy from 05/03/2015 show residual plasma cells and the patient remained in [facility] I will see her per protocol in 2 months  Anemia in neoplastic disease This is likely due to recent treatment. The patient denies recent history of bleeding such as epistaxis, hematuria or hematochezia. She is asymptomatic from the anemia. I will observe for now.     Leukopenia due to antineoplastic chemotherapy This is likely due to recent treatment. The patient denies recent history of fevers, cough, chills, diarrhea or dysuria. She is asymptomatic from the leukopenia. I  will observe for now.  I will continue the chemotherapy at current dose without dosage adjustment.  If the leukopenia gets progressive worse in the future, I might have to delay her treatment or adjust the chemotherapy dose.      No orders of the defined types were placed in this encounter.   All questions were answered. The patient knows to call the clinic with any problems, questions or concerns. No barriers to learning was detected. I spent 15 minutes counseling the patient face to face. The total time spent in the appointment was 20 minutes and more than 50% was on counseling and review of test results     GORSUCH, NI, MD 05/11/2015 1:36 PM    

## 2015-05-11 NOTE — Assessment & Plan Note (Signed)
This is likely due to recent treatment. The patient denies recent history of fevers, cough, chills, diarrhea or dysuria. She is asymptomatic from the leukopenia. I will observe for now.  I will continue the chemotherapy at current dose without dosage adjustment.  If the leukopenia gets progressive worse in the future, I might have to delay her treatment or adjust the chemotherapy dose.   

## 2015-05-14 LAB — CHROMOSOME ANALYSIS, BONE MARROW

## 2015-05-14 LAB — TISSUE HYBRIDIZATION (BONE MARROW)-NCBH

## 2015-05-15 LAB — SPEP & IFE WITH QIG
Albumin ELP: 3.6 g/dL — ABNORMAL LOW (ref 3.8–4.8)
Alpha-1-Globulin: 0.3 g/dL (ref 0.2–0.3)
Alpha-2-Globulin: 0.6 g/dL (ref 0.5–0.9)
Beta 2: 0.4 g/dL (ref 0.2–0.5)
Beta Globulin: 0.4 g/dL (ref 0.4–0.6)
Gamma Globulin: 1.6 g/dL (ref 0.8–1.7)
IgA: 127 mg/dL (ref 69–380)
IgG (Immunoglobin G), Serum: 1720 mg/dL — ABNORMAL HIGH (ref 690–1700)
IgM, Serum: 59 mg/dL (ref 52–322)
Total Protein, Serum Electrophoresis: 6.9 g/dL (ref 6.1–8.1)

## 2015-05-15 LAB — KAPPA/LAMBDA LIGHT CHAINS
Kappa free light chain: 2.59 mg/dL — ABNORMAL HIGH (ref 0.33–1.94)
Kappa:Lambda Ratio: 1.12 (ref 0.26–1.65)
Lambda Free Lght Chn: 2.32 mg/dL (ref 0.57–2.63)

## 2015-05-25 ENCOUNTER — Encounter (HOSPITAL_COMMUNITY): Payer: Self-pay

## 2015-05-30 ENCOUNTER — Other Ambulatory Visit: Payer: Self-pay | Admitting: *Deleted

## 2015-05-30 DIAGNOSIS — C9000 Multiple myeloma not having achieved remission: Secondary | ICD-10-CM

## 2015-05-30 MED ORDER — LENALIDOMIDE 5 MG PO CAPS CTSU E1A11: ORAL_CAPSULE | ORAL | Status: DC

## 2015-06-08 ENCOUNTER — Other Ambulatory Visit (HOSPITAL_BASED_OUTPATIENT_CLINIC_OR_DEPARTMENT_OTHER): Payer: 59

## 2015-06-08 ENCOUNTER — Encounter: Payer: 59 | Admitting: *Deleted

## 2015-06-08 DIAGNOSIS — Z006 Encounter for examination for normal comparison and control in clinical research program: Secondary | ICD-10-CM

## 2015-06-08 DIAGNOSIS — C9000 Multiple myeloma not having achieved remission: Secondary | ICD-10-CM

## 2015-06-08 DIAGNOSIS — R946 Abnormal results of thyroid function studies: Secondary | ICD-10-CM

## 2015-06-08 LAB — CBC WITH DIFFERENTIAL/PLATELET
BASO%: 0.3 % (ref 0.0–2.0)
Basophils Absolute: 0 10*3/uL (ref 0.0–0.1)
EOS%: 2.7 % (ref 0.0–7.0)
Eosinophils Absolute: 0.1 10*3/uL (ref 0.0–0.5)
HCT: 35.8 % (ref 34.8–46.6)
HGB: 11.3 g/dL — ABNORMAL LOW (ref 11.6–15.9)
LYMPH%: 56 % — ABNORMAL HIGH (ref 14.0–49.7)
MCH: 22.9 pg — ABNORMAL LOW (ref 25.1–34.0)
MCHC: 31.6 g/dL (ref 31.5–36.0)
MCV: 72.5 fL — ABNORMAL LOW (ref 79.5–101.0)
MONO#: 0.4 10*3/uL (ref 0.1–0.9)
MONO%: 12 % (ref 0.0–14.0)
NEUT#: 0.9 10*3/uL — ABNORMAL LOW (ref 1.5–6.5)
NEUT%: 29 % — ABNORMAL LOW (ref 38.4–76.8)
Platelets: 192 10*3/uL (ref 145–400)
RBC: 4.94 10*6/uL (ref 3.70–5.45)
RDW: 15.8 % — ABNORMAL HIGH (ref 11.2–14.5)
WBC: 3 10*3/uL — ABNORMAL LOW (ref 3.9–10.3)
lymph#: 1.7 10*3/uL (ref 0.9–3.3)
nRBC: 0 % (ref 0–0)

## 2015-06-08 LAB — COMPREHENSIVE METABOLIC PANEL (CC13)
ALT: 29 U/L (ref 0–55)
AST: 24 U/L (ref 5–34)
Albumin: 3.5 g/dL (ref 3.5–5.0)
Alkaline Phosphatase: 59 U/L (ref 40–150)
Anion Gap: 7 mEq/L (ref 3–11)
BUN: 11.7 mg/dL (ref 7.0–26.0)
CO2: 25 mEq/L (ref 22–29)
Calcium: 9.4 mg/dL (ref 8.4–10.4)
Chloride: 109 mEq/L (ref 98–109)
Creatinine: 0.9 mg/dL (ref 0.6–1.1)
EGFR: 87 mL/min/{1.73_m2} — ABNORMAL LOW (ref >90)
Glucose: 91 mg/dl (ref 70–140)
Potassium: 3.7 mEq/L (ref 3.5–5.1)
Sodium: 140 mEq/L (ref 136–145)
Total Bilirubin: 0.56 mg/dL (ref 0.20–1.20)
Total Protein: 7.2 g/dL (ref 6.4–8.3)

## 2015-06-08 LAB — TSH CHCC: TSH: 0.589 m(IU)/L (ref 0.308–3.960)

## 2015-06-08 NOTE — Progress Notes (Signed)
06/08/2015 Patient in to clinic today for lab tests prior to initiating Cycle 9. Due to [place] of 900, treatment will be held per protocol. Per Dr. Alvy Bimler, patient to return to clinic in one week for repeat CBC to determine if treatment can be resumed. Contacted patient via cell phone at [phone] to notify her of the above. Patient voices understanding, and will hold treatment until after next week's evaluation is complete. Patient returned completed cycle 8 Patient Medication Calendar, confirming correct dosing as prescribed.  [name] BSN, RN, [place] 06/08/2015 10:53 AM

## 2015-06-12 LAB — SPEP & IFE WITH QIG
Albumin ELP: 3.8 g/dL (ref 3.8–4.8)
Alpha-1-Globulin: 0.3 g/dL (ref 0.2–0.3)
Alpha-2-Globulin: 0.6 g/dL (ref 0.5–0.9)
Beta 2: 0.4 g/dL (ref 0.2–0.5)
Beta Globulin: 0.4 g/dL (ref 0.4–0.6)
Gamma Globulin: 1.7 g/dL (ref 0.8–1.7)
IgA: 124 mg/dL (ref 69–380)
IgG (Immunoglobin G), Serum: 1670 mg/dL (ref 690–1700)
IgM, Serum: 57 mg/dL (ref 52–322)
Total Protein, Serum Electrophoresis: 7.2 g/dL (ref 6.1–8.1)

## 2015-06-12 LAB — KAPPA/LAMBDA LIGHT CHAINS
Kappa free light chain: 2.36 mg/dL — ABNORMAL HIGH (ref 0.33–1.94)
Kappa:Lambda Ratio: 1.14 (ref 0.26–1.65)
Lambda Free Lght Chn: 2.07 mg/dL (ref 0.57–2.63)

## 2015-06-15 ENCOUNTER — Encounter: Payer: Self-pay | Admitting: Hematology and Oncology

## 2015-06-15 ENCOUNTER — Other Ambulatory Visit (HOSPITAL_BASED_OUTPATIENT_CLINIC_OR_DEPARTMENT_OTHER): Payer: 59

## 2015-06-15 ENCOUNTER — Encounter: Payer: 59 | Admitting: *Deleted

## 2015-06-15 ENCOUNTER — Telehealth: Payer: Self-pay | Admitting: Hematology and Oncology

## 2015-06-15 DIAGNOSIS — Z006 Encounter for examination for normal comparison and control in clinical research program: Secondary | ICD-10-CM | POA: Diagnosis not present

## 2015-06-15 DIAGNOSIS — C9000 Multiple myeloma not having achieved remission: Secondary | ICD-10-CM | POA: Diagnosis not present

## 2015-06-15 LAB — CBC WITH DIFFERENTIAL/PLATELET
BASO%: 0.3 % (ref 0.0–2.0)
Basophils Absolute: 0 10*3/uL (ref 0.0–0.1)
EOS%: 2.1 % (ref 0.0–7.0)
Eosinophils Absolute: 0.1 10*3/uL (ref 0.0–0.5)
HCT: 35.5 % (ref 34.8–46.6)
HGB: 11.2 g/dL — ABNORMAL LOW (ref 11.6–15.9)
LYMPH%: 53.8 % — ABNORMAL HIGH (ref 14.0–49.7)
MCH: 23 pg — ABNORMAL LOW (ref 25.1–34.0)
MCHC: 31.5 g/dL (ref 31.5–36.0)
MCV: 72.9 fL — ABNORMAL LOW (ref 79.5–101.0)
MONO#: 0.4 10*3/uL (ref 0.1–0.9)
MONO%: 10.7 % (ref 0.0–14.0)
NEUT#: 1.1 10*3/uL — ABNORMAL LOW (ref 1.5–6.5)
NEUT%: 33.1 % — ABNORMAL LOW (ref 38.4–76.8)
Platelets: 256 10*3/uL (ref 145–400)
RBC: 4.87 10*6/uL (ref 3.70–5.45)
RDW: 16.1 % — ABNORMAL HIGH (ref 11.2–14.5)
WBC: 3.3 10*3/uL — ABNORMAL LOW (ref 3.9–10.3)
lymph#: 1.8 10*3/uL (ref 0.9–3.3)
nRBC: 0 % (ref 0–0)

## 2015-06-15 LAB — COMPREHENSIVE METABOLIC PANEL (CC13)
ALT: 25 U/L (ref 0–55)
AST: 21 U/L (ref 5–34)
Albumin: 3.5 g/dL (ref 3.5–5.0)
Alkaline Phosphatase: 62 U/L (ref 40–150)
Anion Gap: 6 mEq/L (ref 3–11)
BUN: 10.9 mg/dL (ref 7.0–26.0)
CO2: 27 mEq/L (ref 22–29)
Calcium: 9.3 mg/dL (ref 8.4–10.4)
Chloride: 108 mEq/L (ref 98–109)
Creatinine: 0.8 mg/dL (ref 0.6–1.1)
EGFR: >90 mL/min/{1.73_m2} (ref >90)
Glucose: 96 mg/dl (ref 70–140)
Potassium: 3.7 mEq/L (ref 3.5–5.1)
Sodium: 141 mEq/L (ref 136–145)
Total Bilirubin: 0.56 mg/dL (ref 0.20–1.20)
Total Protein: 7.2 g/dL (ref 6.4–8.3)

## 2015-06-15 MED ORDER — LENALIDOMIDE 5 MG PO CAPS CTSU E1A11: ORAL_CAPSULE | ORAL | Status: DC

## 2015-06-15 NOTE — Telephone Encounter (Signed)
Per pof appt changed...per orders pof pt aware and will ck my chart

## 2015-06-15 NOTE — Progress Notes (Signed)
06/15/2015 Patient in to clinic today for repeat labs. Based on lab results review by Dr. Alvy Bimler, patient condition is acceptable for continued treatment. Notified patient by phone that she may resume treatment (lenalidomide 5mg  daily) today, using calendar with Cycle 9 treatment days marked, given to patient today, to document July/August doses. Also notified patient of changes to Cycle 10 appointments, due to Cycle 9 treatment delays. Patient verbalized understanding. Patient reports ongoing back pain, generalized pain, bone pain and leg pain as before (all are moderate - grade 2 - in nature). She felt more fatigued than usual 6/21-6/24, thogh she continued to spend more than 50% of her time out of bed, and fatigue did not impact her self-care or ADLs. [name] BSN, RN, [place] 06/15/2015 9:49 AM

## 2015-06-19 ENCOUNTER — Ambulatory Visit (INDEPENDENT_AMBULATORY_CARE_PROVIDER_SITE_OTHER): Payer: 59 | Admitting: Neurology

## 2015-06-19 DIAGNOSIS — G43709 Chronic migraine without aura, not intractable, without status migrainosus: Secondary | ICD-10-CM | POA: Diagnosis not present

## 2015-06-19 MED ORDER — VERAPAMIL HCL ER 120 MG PO CP24: ORAL_CAPSULE | ORAL | Status: DC

## 2015-06-19 MED ORDER — VENLAFAXINE HCL ER 75 MG PO CP24: 75.0000 mg | ORAL_CAPSULE | Freq: Every day | ORAL | Status: DC

## 2015-06-19 NOTE — Progress Notes (Signed)
Carla Perry    Provider:  Dr Carla Perry Referring Provider: Wenda Low, MD Primary Care Physician:  Carla Low, MD    CC:  Chronic migaines  Interval update: This is a former patient of Carla. Janann Perry who is seen in our office by our nurse practitioner Carla Perry. Patient is establishing care with me today. She is a 59 year old female with a history of chronic migraines. She was last seen in April. Today she returns and she is doing very well. She is on verapamil 120 mg extended release and venlafaxine XR 75 mg. She is doing very well. She has had maybe 3 migraines within the last few months. And they have been on the left side of the head with vertigo twice. When she has the vertigo, she has to lay in bed and sleep it off. But this is a significant improvement and she is very happy with this maintenance. She endorses nausea, photophobia and phonophobia. She takes tramadol for acute management. Discussed in detail that if she is doing well, we will keep her on her current migraine management. She can continue to follow up with Carla Perry as needed.   HISTORY OF PRESENT ILLNESS: Carla Perry is a 59 year old female with a history of chronic migraines. She returns today for follow-up. The patient is currently taking Effexor 37.5 mg daily as well as verapamil 120 mg daily. She reports that at the last visit Carla. Janann Perry decreased her Effexor. She states since then she has had 13 headaches in March and she has had 4 headaches in April so far. She states her headaches are normally located on the left side and associated with left-sided numbness. She confirms nausea, photophobia and phonophobia. She also states that when the headaches become severe its as if she is on the verge of having vertigo. She states that she can take tramadol and her headache will normally resolve in 1-2 hours. She states that if she gets a headache on the right side it usually resolves with Advil migraine.  The patient does have multiple myeloma and takes a chemotherapy pill. She denies any new neurological symptoms. Denies any new medical issues. She returns today for evaluation.  HISTORY 08/02/14 Janann Perry): Carla Perry is a 59 y.o. female, former Carla Perry patient, here as a follow up appointment for chronic migraines with last visit being on 08/09/2013 at which time she received Botox injections. She notes good benefit from these injections. Did not follow up for repeat injections. Since last visit she was unfortunately diagnosed with multiple myeloma and has been undergoing treatment with oncology. Reports so far things are going well. She has one more cycle and then she is finished. She reports she has held off on the Botox injections per recommendation of her oncologist, instructed she can restart once she is done with chemotherapy.   Continues to have headaches. Typically will have 1 to 3 headaches per month. Takes tramadol and falls asleep, when she wakes up the headache is gone. Describes it is a predominantly right sided temporal squeezing pounding type pain. + Nausea, no emesis, + photo and phonophobia. Headaches last 6 hours to all day. Notes she gets weak on the left side with the headaches and has baseline numbness on the left side of her body. Reports she has not had a MRI brain in years. She notes Carla Perry started her on Effexor and Verapamil. She wishes to get off both of these medications as she feels they are not helping.   Recently  saw eye doctor for right eye pain, told she may have glaucoma in her right eye, they will continue to monitor. They also told her that she has dry eyes, was given lubricating eye drops. The eye drops have improved the eye pain.   Initial visit 05/2013: Former Carla Perry patient. She reports being overall stable. Continues to have daily migrainous headache which have been ongoing for greater then 4 years. Predominantly R sided temporal, described as squeezing and  pounding. These headaches are her baseline headache and are occuring on a near continuous pattern every day of the month. Is also having severe "flare ups" where the pain worsens and becomes a more sharp/intense pain. These episodes are occuring around 8 to 10 times per month. With these headaches she continues to have episodes of transient unilateral (predominantly R sided) weakness that self resolves. Also notes occasional unilateral sensory changes (again R sided predominant). Also has episodes of transient vertigo associated with the headache, has occurred 2 times in the past month, requiring her to miss work. She has had multiple MRIs during these events to rule out stroke and no ischemic changes have been found. Has been unable to tolerate most abortive agents and due to presence of hemiplegic/basilar symptoms she is not a candidate for triptans or DHE. In regards to prophylactic agents she has tried for >3-4 months the following agents and failed/did not tolerate: Topamax (AED), Propranolol (BB), verapamil (CCB) and Effexor (SNRI). Remains on verapmil and Effexor per Carla Perry but no benefit noted. Denies any family history of hemiplegic migraine.   Continues to have subjective memory difficulties, notes a "fogginess". Has had formal neuro-psych testing which showed an improvement in her overall cognitive function. Continues to have episodes of auditory hallucinations. Has not seen a psychiatrist for this.    Review of Systems: Patient complains of symptoms per HPI as well as the following symptoms: Joint pain, back pain, numbness. Pertinent negatives per HPI. All others negative.   History   Social History  . Marital Status: Married    Spouse Name: Carla Perry  . Number of Children: 2  . Years of Education: 14   Occupational History  .  Lab [employer]   Social History Main Topics  . Smoking status: Never Smoker   . Smokeless tobacco: Never Used  . Alcohol Use: No  . Drug Use: No  . Sexual  Activity: Not on file   Other Topics Concern  . Not on file   Social History Narrative   Patient lives at home with her husband Special educational needs teacher). Patient has two years college.   Right handed.   Caffeine- None    Family History  Problem Relation Age of Onset  . Throat cancer Father   . Stroke Father   . Cancer Brother     prostate    Past Medical History  Diagnosis Date  . HBP (high blood pressure)   . Thyroid disorder   . Migraine   . Memory loss   . Anemia   . MGUS (monoclonal gammopathy of unknown significance)   . MGUS (monoclonal gammopathy of unknown significance) 10/06/2013  . Anemia, unspecified 10/06/2013  . Bone pain 10/21/2013  . Multiple myeloma, without mention of having achieved remission 11/16/2013  . Seizure 1960    single seizure episode at age 30  . Leukopenia due to antineoplastic chemotherapy 12/27/2013  . Right leg swelling 02/07/2014  . DVT (deep venous thrombosis) 02/07/2014  . Diverticulitis 07/18/2014  . Abnormal thyroid function test 08/30/2014  .  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 has not picked oral med up from pharmacy as of 09-14-14  . Vitamin D deficiency 10/24/2014  . Bronchitis 01/31/2015    Past Surgical History  Procedure Laterality Date  . Hemorrhoid surgery    . Portacath placement Right jan 2015  . Port-a-cath removal  10-2014    Current Outpatient Prescriptions  Medication Sig Dispense Refill  . aspirin 325 MG tablet Take 325 mg by mouth daily.    . carboxymethylcellulose (REFRESH PLUS) 0.5 % SOLN Place 1 drop into both eyes daily as needed (for dry eyes).     . Cholecalciferol (VITAMIN D3) 2000 UNITS TABS Take 2,000 Units by mouth daily.    . hydrochlorothiazide (HYDRODIURIL) 25 MG tablet Take 25 mg by mouth daily with breakfast.     . lenalidomide (REVLIMID) 5 MG capsule Take 1 capsule daily on days 1-21. Repeat every 28 days. 21 capsule 0  . levothyroxine (SYNTHROID, LEVOTHROID) 75 MCG tablet  Take 75 mcg by mouth daily before breakfast.    . Multiple Vitamins-Minerals (CENTRUM SILVER PO) Take 1 tablet by mouth daily.     . ONGLYZA 5 MG TABS tablet Take 5 mg by mouth.    . Oxycodone HCl 10 MG TABS Take 1 tablet (10 mg total) by mouth every 4 (four) hours as needed (for pain). 60 tablet 0  . polyethylene glycol (MIRALAX / GLYCOLAX) packet Take 17 g by mouth daily.    . traMADol (ULTRAM) 50 MG tablet Take 1 tablet (50 mg total) by mouth every 6 (six) hours as needed for moderate pain. 90 tablet 0  . venlafaxine XR (EFFEXOR-XR) 75 MG 24 hr capsule Take 1 capsule (75 mg total) by mouth daily with breakfast. 30 capsule 4  . verapamil (VERELAN PM) 120 MG 24 hr capsule Take 1 capsule by mouth  daily (Patient taking differently: Take 120 mg by mouth daily. ) 30 capsule 4   No current facility-administered medications for this visit.    Allergies as of 06/19/2015  . (No Known Allergies)    Vitals: There were no vitals taken for this visit. Last Weight:  Wt Readings from Last 1 Encounters:  05/11/15 172 lb 3.2 oz (78.109 kg)   Last Height:   Ht Readings from Last 1 Encounters:  05/11/15 5' 7"  (1.702 m)   Physical exam: Exam: Gen: NAD, conversant, well nourised, obese, well groomed                     CV: RRR, no MRG. No Carotid Bruits. No peripheral edema, warm, nontender Eyes: Conjunctivae clear without exudates or hemorrhage  Neuro: Detailed Neurologic Exam  Speech:    Speech is normal; fluent and spontaneous with normal comprehension.  Cognition:    The patient is oriented to person, place, and time;     recent and remote memory intact;     language fluent;     normal attention, concentration,     fund of knowledge Cranial Nerves:    The pupils are equal, round, and reactive to light. The fundi are flat. Visual fields are full to finger confrontation. Extraocular movements are intact. Trigeminal sensation is intact and the muscles of mastication are normal. The face is  symmetric. The palate elevates in the midline. Hearing intact. Voice is normal. Shoulder shrug is normal. The tongue has normal motion without fasciculations.   Motor Observation:    No asymmetry, no atrophy, and no involuntary movements  noted. Tone:    Normal muscle tone.    Posture:    Posture is normal. normal erect    Strength:    Strength is V/V in the upper and lower limbs.      Sensation: intact to LT          Assessment/Plan:  59 y.o. year old female  has a past medical history of HBP (high blood pressure); Thyroid disorder; Migraine; Memory loss; Anemia; MGUS (monoclonal gammopathy of unknown significance); MGUS (monoclonal gammopathy of unknown significance) (10/06/2013); Anemia, unspecified (10/06/2013); Bone pain (10/21/2013); Multiple myeloma, without mention of having achieved remission (11/16/2013); Seizure (1960); Leukopenia due to antineoplastic chemotherapy (12/27/2013); Right leg swelling (02/07/2014); DVT (deep venous thrombosis) (02/07/2014); Diverticulitis (07/18/2014); Abnormal thyroid function test (08/30/2014); Diabetes mellitus without complication (01/5485); Vitamin D deficiency (10/24/2014); and Bronchitis (01/31/2015). here with chronic migraine.  She is doing extremely well. We'll continue venlafaxine and verapamil for preventative. Will continue tramadol for acute management. Discussed the following: To prevent or relieve headaches, try the following: Cool Compress. Lie down and place a cool compress on your head.  Avoid headache triggers. If certain foods or odors seem to have triggered your migraines in the past, avoid them. A headache diary might help you identify triggers.  Include physical activity in your daily routine. Try a daily walk or other moderate aerobic exercise.  Manage stress. Find healthy ways to cope with the stressors, such as delegating tasks on your to-do list.  Practice relaxation techniques. Try deep breathing, yoga, massage and visualization.   Eat regularly. Eating regularly scheduled meals and maintaining a healthy diet might help prevent headaches. Also, drink plenty of fluids.  Follow a regular sleep schedule. Sleep deprivation might contribute to headaches Consider biofeedback. With this mind-body technique, you learn to control certain bodily functions - such as muscle tension, heart rate and blood pressure - to prevent headaches or reduce headache pain.    Proceed to emergency room if you experience new or worsening symptoms or symptoms do not resolve, if you have new neurologic symptoms or if headache is severe, or for any concerning symptom.    Sarina Ill, MD  [facility] [address] [place], [place] [address]  Phone [phone] Fax [phone]  A total of 30  minutes was spent face-to-face with this patient. Over half this time was spent on counseling patient on the migraine diagnosis and different diagnostic and therapeutic options available.

## 2015-06-23 ENCOUNTER — Encounter: Payer: Self-pay | Admitting: Neurology

## 2015-06-28 ENCOUNTER — Other Ambulatory Visit: Payer: Self-pay | Admitting: *Deleted

## 2015-06-28 DIAGNOSIS — C9000 Multiple myeloma not having achieved remission: Secondary | ICD-10-CM

## 2015-06-29 ENCOUNTER — Other Ambulatory Visit: Payer: 59

## 2015-07-02 ENCOUNTER — Encounter: Payer: Self-pay | Admitting: Hematology and Oncology

## 2015-07-06 ENCOUNTER — Encounter: Payer: Self-pay | Admitting: *Deleted

## 2015-07-06 ENCOUNTER — Ambulatory Visit: Payer: 59 | Admitting: Hematology and Oncology

## 2015-07-06 ENCOUNTER — Other Ambulatory Visit (HOSPITAL_BASED_OUTPATIENT_CLINIC_OR_DEPARTMENT_OTHER): Payer: 59

## 2015-07-06 DIAGNOSIS — C9000 Multiple myeloma not having achieved remission: Secondary | ICD-10-CM

## 2015-07-06 DIAGNOSIS — D630 Anemia in neoplastic disease: Secondary | ICD-10-CM

## 2015-07-06 DIAGNOSIS — D701 Agranulocytosis secondary to cancer chemotherapy: Secondary | ICD-10-CM | POA: Diagnosis not present

## 2015-07-06 LAB — LACTATE DEHYDROGENASE (CC13): LDH: 200 U/L (ref 125–245)

## 2015-07-06 LAB — CBC WITH DIFFERENTIAL/PLATELET
BASO%: 0.4 % (ref 0.0–2.0)
Basophils Absolute: 0 10*3/uL (ref 0.0–0.1)
EOS%: 4.1 % (ref 0.0–7.0)
Eosinophils Absolute: 0.1 10*3/uL (ref 0.0–0.5)
HCT: 38 % (ref 34.8–46.6)
HGB: 11.9 g/dL (ref 11.6–15.9)
LYMPH%: 50.3 % — ABNORMAL HIGH (ref 14.0–49.7)
MCH: 22.6 pg — ABNORMAL LOW (ref 25.1–34.0)
MCHC: 31.2 g/dL — ABNORMAL LOW (ref 31.5–36.0)
MCV: 72.5 fL — ABNORMAL LOW (ref 79.5–101.0)
MONO#: 0.4 10*3/uL (ref 0.1–0.9)
MONO%: 13.9 % (ref 0.0–14.0)
NEUT#: 0.9 10*3/uL — ABNORMAL LOW (ref 1.5–6.5)
NEUT%: 31.3 % — ABNORMAL LOW (ref 38.4–76.8)
Platelets: 182 10*3/uL (ref 145–400)
RBC: 5.24 10*6/uL (ref 3.70–5.45)
RDW: 16.1 % — ABNORMAL HIGH (ref 11.2–14.5)
WBC: 3 10*3/uL — ABNORMAL LOW (ref 3.9–10.3)
lymph#: 1.5 10*3/uL (ref 0.9–3.3)

## 2015-07-06 LAB — COMPREHENSIVE METABOLIC PANEL (CC13)
ALT: 42 U/L (ref 0–55)
AST: 27 U/L (ref 5–34)
Albumin: 3.7 g/dL (ref 3.5–5.0)
Alkaline Phosphatase: 61 U/L (ref 40–150)
Anion Gap: 7 mEq/L (ref 3–11)
BUN: 8 mg/dL (ref 7.0–26.0)
CO2: 28 mEq/L (ref 22–29)
Calcium: 9.2 mg/dL (ref 8.4–10.4)
Chloride: 108 mEq/L (ref 98–109)
Creatinine: 0.8 mg/dL (ref 0.6–1.1)
EGFR: >90 mL/min/{1.73_m2} (ref >90)
Glucose: 92 mg/dl (ref 70–140)
Potassium: 3.7 mEq/L (ref 3.5–5.1)
Sodium: 143 mEq/L (ref 136–145)
Total Bilirubin: 0.56 mg/dL (ref 0.20–1.20)
Total Protein: 7.6 g/dL (ref 6.4–8.3)

## 2015-07-06 NOTE — Progress Notes (Signed)
07/06/2015 Patient in to clinic this morning for lab tests, 7 days prior to initiating Cycle 10 treatment. Patient returned completed cycle 9 Patient Medication Calendar, confirming correct dosing as prescribed, with last dose taken yesterday. Patient has not yet received notification of lenalidomide refill from the specialty pharmacy, and will contact the pharmacy by Monday to confirm that prescription will be received prior to anticipated treatment start on Friday, August 12th. Due to [place] of 900 today, CBC will be repeated prior to MD visit next Friday. Patient was notified of Evaro results and lab appointment for 8/12 at [place]. Brigitte Pulse BSN, RN, CCRP 07/06/2015 11:01 AM

## 2015-07-09 ENCOUNTER — Other Ambulatory Visit: Payer: Self-pay | Admitting: *Deleted

## 2015-07-09 DIAGNOSIS — C9000 Multiple myeloma not having achieved remission: Secondary | ICD-10-CM

## 2015-07-09 MED ORDER — LENALIDOMIDE 5 MG PO CAPS CTSU E1A11: ORAL_CAPSULE | ORAL | Status: DC

## 2015-07-09 NOTE — Progress Notes (Signed)
07/09/2015 Per telephone call from patient on Friday, 07/06/15, specialty pharmacy had not yet processed a refill for Revlimid, but will initiate process. Notified Dr. Calton Dach nurse, Foye Spurling of need for refill, and she will begin process of submitting a refill prescription. [name] BSN, RN, [place] 07/09/2015 11:39 AM

## 2015-07-10 LAB — SPEP & IFE WITH QIG
Albumin ELP: 3.8 g/dL (ref 3.8–4.8)
Alpha-1-Globulin: 0.4 g/dL — ABNORMAL HIGH (ref 0.2–0.3)
Alpha-2-Globulin: 0.6 g/dL (ref 0.5–0.9)
Beta 2: 0.3 g/dL (ref 0.2–0.5)
Beta Globulin: 0.5 g/dL (ref 0.4–0.6)
Gamma Globulin: 1.7 g/dL (ref 0.8–1.7)
IgA: 145 mg/dL (ref 69–380)
IgG (Immunoglobin G), Serum: 1670 mg/dL (ref 690–1700)
IgM, Serum: 56 mg/dL (ref 52–322)
Total Protein, Serum Electrophoresis: 7.3 g/dL (ref 6.1–8.1)

## 2015-07-10 LAB — KAPPA/LAMBDA LIGHT CHAINS
Kappa free light chain: 3.14 mg/dL — ABNORMAL HIGH (ref 0.33–1.94)
Kappa:Lambda Ratio: 1.44 (ref 0.26–1.65)
Lambda Free Lght Chn: 2.18 mg/dL (ref 0.57–2.63)

## 2015-07-13 ENCOUNTER — Ambulatory Visit (HOSPITAL_BASED_OUTPATIENT_CLINIC_OR_DEPARTMENT_OTHER): Payer: 59 | Admitting: Hematology and Oncology

## 2015-07-13 ENCOUNTER — Encounter: Payer: 59 | Admitting: *Deleted

## 2015-07-13 ENCOUNTER — Other Ambulatory Visit (HOSPITAL_BASED_OUTPATIENT_CLINIC_OR_DEPARTMENT_OTHER): Payer: 59

## 2015-07-13 ENCOUNTER — Encounter: Payer: Self-pay | Admitting: Hematology and Oncology

## 2015-07-13 ENCOUNTER — Telehealth: Payer: Self-pay | Admitting: Hematology and Oncology

## 2015-07-13 VITALS — BP 124/77 | HR 69 | Temp 98.2°F | Resp 18 | Ht 67.0 in | Wt 170.2 lb

## 2015-07-13 DIAGNOSIS — D701 Agranulocytosis secondary to cancer chemotherapy: Secondary | ICD-10-CM

## 2015-07-13 DIAGNOSIS — C9000 Multiple myeloma not having achieved remission: Secondary | ICD-10-CM

## 2015-07-13 DIAGNOSIS — M899 Disorder of bone, unspecified: Secondary | ICD-10-CM | POA: Diagnosis not present

## 2015-07-13 DIAGNOSIS — R946 Abnormal results of thyroid function studies: Secondary | ICD-10-CM

## 2015-07-13 DIAGNOSIS — D72819 Decreased white blood cell count, unspecified: Secondary | ICD-10-CM

## 2015-07-13 DIAGNOSIS — Z006 Encounter for examination for normal comparison and control in clinical research program: Secondary | ICD-10-CM

## 2015-07-13 DIAGNOSIS — D630 Anemia in neoplastic disease: Secondary | ICD-10-CM | POA: Diagnosis not present

## 2015-07-13 DIAGNOSIS — R5383 Other fatigue: Secondary | ICD-10-CM

## 2015-07-13 DIAGNOSIS — M898X9 Other specified disorders of bone, unspecified site: Secondary | ICD-10-CM

## 2015-07-13 DIAGNOSIS — T451X5A Adverse effect of antineoplastic and immunosuppressive drugs, initial encounter: Secondary | ICD-10-CM

## 2015-07-13 LAB — CBC WITH DIFFERENTIAL/PLATELET
BASO%: 0.4 % (ref 0.0–2.0)
Basophils Absolute: 0 10*3/uL (ref 0.0–0.1)
EOS%: 1.9 % (ref 0.0–7.0)
Eosinophils Absolute: 0.1 10*3/uL (ref 0.0–0.5)
HCT: 35.9 % (ref 34.8–46.6)
HGB: 11.2 g/dL — ABNORMAL LOW (ref 11.6–15.9)
LYMPH%: 59.5 % — ABNORMAL HIGH (ref 14.0–49.7)
MCH: 22.6 pg — ABNORMAL LOW (ref 25.1–34.0)
MCHC: 31.3 g/dL — ABNORMAL LOW (ref 31.5–36.0)
MCV: 72.2 fL — ABNORMAL LOW (ref 79.5–101.0)
MONO#: 0.5 10*3/uL (ref 0.1–0.9)
MONO%: 13.7 % (ref 0.0–14.0)
NEUT#: 1 10*3/uL — ABNORMAL LOW (ref 1.5–6.5)
NEUT%: 24.5 % — ABNORMAL LOW (ref 38.4–76.8)
Platelets: 250 10*3/uL (ref 145–400)
RBC: 4.97 10*6/uL (ref 3.70–5.45)
RDW: 15.9 % — ABNORMAL HIGH (ref 11.2–14.5)
WBC: 3.9 10*3/uL (ref 3.9–10.3)
lymph#: 2.3 10*3/uL (ref 0.9–3.3)

## 2015-07-13 MED ORDER — OXYCODONE HCL 10 MG PO TABS: 10.0000 mg | ORAL_TABLET | ORAL | Status: DC | PRN

## 2015-07-13 MED ORDER — LENALIDOMIDE 5 MG PO CAPS CTSU E1A11: ORAL_CAPSULE | ORAL | Status: DC

## 2015-07-13 NOTE — Assessment & Plan Note (Signed)
This is likely due to recent treatment. The patient denies recent history of fevers, cough, chills, diarrhea or dysuria. She is asymptomatic from the leukopenia. I will observe for now.  I will continue the chemotherapy at current dose without dosage adjustment.  If the leukopenia gets progressive worse in the future, I might have to delay her treatment or adjust the chemotherapy dose.   

## 2015-07-13 NOTE — Telephone Encounter (Signed)
Pt confirmed labs/ov per 08/12 POF, gave pt AVS and Calendar... KJ °

## 2015-07-13 NOTE — Assessment & Plan Note (Signed)
She is doing well clinically while on treatment. Continue Revlimid at current dose adjustment. 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

## 2015-07-13 NOTE — Progress Notes (Signed)
07/13/2015 Patient in to clinic today for evaluation prior to receiving maintenance treatment cycle 10. Patient reports ongoing complaints of moderate back pain, generalized pain and fatigue (low energy). She also reports muscle spasms in her right lower extremity (thigh and calf), and bone pain (moderate) in hips. Repeat CBC today reveals ANC of 1000, which is adequate to initiate next cycle of treatment. Remaining labs obtained within 7 days prior to cycle start date are within limits for re-treatment. Based on lab results review and history and physical by Dr. Alvy Bimler, patient condition is acceptable for continued treatment. Patient given printed appointment calendar with Cycle 10 treatment days marked, given to patient today, to document doses. Per MD assessment, patient meets criteria for VGPR. [name] BSN, RN, [place] 07/13/2015 12:56 PM

## 2015-07-13 NOTE — Assessment & Plan Note (Signed)
The thyroid function test had normalized recently. We will recheck it again 2 months

## 2015-07-13 NOTE — Progress Notes (Signed)
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   05/05/2014 Imaging Imaging study of the neck showed no explanation that could cause right neck pain. She is noted to have incidental left upper lung nodule. Plan to repeat imaging study in 3 months.   09/06/2014 Imaging Bone survey showed no evidence of fracture   09/14/2014 Bone Marrow Biopsy Bone marrow biopsy showed 8% residual plasma cells by manual count but none on the biopsy specimen   09/26/2014 -  Chemotherapy She is started on cycle 1 of maintenance Revlimid   01/31/2015 Imaging  chest x-ray showed pneumonia. Treatment was placed on hold.   05/03/2015 Bone Marrow Biopsy Accession: [id number] repeat bone marrow aspirate and biopsy show 5% residual plasma cells    INTERVAL HISTORY: Please see below for problem oriented charting. She returns for further follow-up. She continues to have intermittent bone pain. She denies recent infection.  REVIEW OF SYSTEMS:   Constitutional: Denies fevers, chills or abnormal weight loss Eyes: Denies blurriness of vision Ears, nose, mouth, throat, and face: Denies mucositis or sore throat Respiratory: Denies cough, dyspnea  or wheezes Cardiovascular: Denies palpitation, chest discomfort or lower extremity swelling Gastrointestinal:  Denies nausea, heartburn or change in bowel habits Skin: Denies abnormal skin rashes Lymphatics: Denies new lymphadenopathy or easy bruising Neurological:Denies numbness, tingling or new weaknesses Behavioral/Psych: Mood is stable, no new changes  All other systems were reviewed with the patient and are negative.  I have reviewed the past medical history, past surgical history, social history and family history with the patient and they are unchanged from previous note.  ALLERGIES:  has No Known Allergies.  MEDICATIONS:  Current Outpatient Prescriptions  Medication Sig Dispense Refill  . aspirin 325 MG tablet Take 325 mg by mouth daily.    . carboxymethylcellulose (REFRESH PLUS) 0.5 % SOLN Place 1 drop into both eyes daily as needed (for dry eyes).     . Cholecalciferol (VITAMIN D3) 2000 UNITS TABS Take 2,000 Units by mouth daily.    . hydrochlorothiazide (HYDRODIURIL) 25 MG tablet Take 25 mg by mouth daily with breakfast.     . levothyroxine (SYNTHROID, LEVOTHROID) 75 MCG tablet Take 75 mcg by mouth daily before breakfast.    . Multiple Vitamins-Minerals (CENTRUM SILVER PO) Take 1 tablet by mouth daily.     . ONGLYZA 5 MG TABS tablet Take 5 mg by mouth.    . Oxycodone HCl 10 MG TABS Take 1 tablet (10 mg total) by mouth every 4 (four) hours as needed (for pain). 90 tablet 0  . polyethylene glycol (MIRALAX / GLYCOLAX) packet Take 17 g by mouth daily.    . traMADol (ULTRAM) 50 MG tablet Take 1 tablet (50 mg total) by mouth every 6 (six) hours as needed for  moderate pain. 90 tablet 0  . venlafaxine XR (EFFEXOR-XR) 75 MG 24 hr capsule Take 1 capsule (75 mg total) by mouth daily with breakfast. 30 capsule 11  . verapamil (VERELAN PM) 120 MG 24 hr capsule Take 1 capsule by mouth  daily 30 capsule 11   No current facility-administered medications for this visit.    PHYSICAL  EXAMINATION: ECOG PERFORMANCE STATUS: 0 - Asymptomatic  Filed Vitals:   07/13/15 0904  BP: 124/77  Pulse: 69  Temp: 98.2 F (36.8 C)  Resp: 18   Filed Weights   07/13/15 0904  Weight: 170 lb 3.2 oz (77.202 kg)    GENERAL:alert, no distress and comfortable SKIN: skin color, texture, turgor are normal, no rashes or significant lesions EYES: normal, Conjunctiva are pink and non-injected, sclera clear OROPHARYNX:no exudate, no erythema and lips, buccal mucosa, and tongue normal  NECK: supple, thyroid normal size, non-tender, without nodularity LYMPH:  no palpable lymphadenopathy in the cervical, axillary or inguinal LUNGS: clear to auscultation and percussion with normal breathing effort HEART: regular rate & rhythm and no murmurs and no lower extremity edema ABDOMEN:abdomen soft, non-tender and normal bowel sounds Musculoskeletal:no cyanosis of digits and no clubbing  NEURO: alert & oriented x 3 with fluent speech, no focal motor/sensory deficits  LABORATORY DATA:  I have reviewed the data as listed    Component Value Date/Time   NA 143 07/06/2015 0816   NA 142 03/16/2015 1012   K 3.7 07/06/2015 0816   K 3.6 03/16/2015 1012   CL 106 03/16/2015 1012   CO2 28 07/06/2015 0816   CO2 29 03/16/2015 1012   GLUCOSE 92 07/06/2015 0816   GLUCOSE 89 03/16/2015 1012   BUN 8.0 07/06/2015 0816   BUN 12 03/16/2015 1012   CREATININE 0.8 07/06/2015 0816   CREATININE 0.86 03/16/2015 1012   CALCIUM 9.2 07/06/2015 0816   CALCIUM 9.3 03/16/2015 1012   PROT 7.6 07/06/2015 0816   PROT 7.4 03/16/2015 1012   ALBUMIN 3.7 07/06/2015 0816   ALBUMIN 3.9 03/16/2015 1012   AST 27 07/06/2015 0816   AST 24 03/16/2015 1012   ALT 42 07/06/2015 0816   ALT 30 03/16/2015 1012   ALKPHOS 61 07/06/2015 0816   ALKPHOS 54 03/16/2015 1012   BILITOT 0.56 07/06/2015 0816   BILITOT 0.4 03/16/2015 1012   GFRNONAA 73* 03/16/2015 1012   GFRAA 85* 03/16/2015 1012    No results found for: SPEP, UPEP  Lab  Results  Component Value Date   WBC 3.9 07/13/2015   NEUTROABS 1.0* 07/13/2015   HGB 11.2* 07/13/2015   HCT 35.9 07/13/2015   MCV 72.2* 07/13/2015   PLT 250 07/13/2015      Chemistry      Component Value Date/Time   NA 143 07/06/2015 0816   NA 142 03/16/2015 1012   K 3.7 07/06/2015 0816   K 3.6 03/16/2015 1012   CL 106 03/16/2015 1012   CO2 28 07/06/2015 0816   CO2 29 03/16/2015 1012   BUN 8.0 07/06/2015 0816   BUN 12 03/16/2015 1012   CREATININE 0.8 07/06/2015 0816   CREATININE 0.86 03/16/2015 1012      Component Value Date/Time   CALCIUM 9.2 07/06/2015 0816   CALCIUM 9.3 03/16/2015 1012   ALKPHOS 61 07/06/2015 0816   ALKPHOS 54 03/16/2015 1012   AST 27 07/06/2015 0816   AST 24 03/16/2015 1012   ALT 42 07/06/2015 0816   ALT 30 03/16/2015 1012   BILITOT 0.56 07/06/2015 0816  BILITOT 0.4 03/16/2015 1012     ASSESSMENT & PLAN:  Multiple myeloma without remission She is doing well clinically while on treatment. Continue Revlimid at current dose adjustment. Recent bone marrow aspirate and biopsy from 05/03/2015 show residual plasma cells and the patient remained in VGPR I will see her per protocol in 3 months   Abnormal thyroid function test The thyroid function test had normalized recently. We will recheck it again 2 months     Anemia in neoplastic disease This is likely due to recent treatment. The patient denies recent history of bleeding such as epistaxis, hematuria or hematochezia. She is asymptomatic from the anemia. I will observe for now.      Bone pain Her symptoms of bone pain has improved. She has no new neurological deficit. She will continue to take high-dose vitamin D and oxycodone as needed.      Leukopenia due to antineoplastic chemotherapy This is likely due to recent treatment. The patient denies recent history of fevers, cough, chills, diarrhea or dysuria. She is asymptomatic from the leukopenia. I will observe for now.  I will  continue the chemotherapy at current dose without dosage adjustment.  If the leukopenia gets progressive worse in the future, I might have to delay her treatment or adjust the chemotherapy dose.       Orders Placed This Encounter  Procedures  . TSH    Standing Status: Future     Number of Occurrences:      Standing Expiration Date: 08/16/2016   All questions were answered. The patient knows to call the clinic with any problems, questions or concerns. No barriers to learning was detected. I spent 20 minutes counseling the patient face to face. The total time spent in the appointment was 30 minutes and more than 50% was on counseling and review of test results     [facility], [place], MD 07/13/2015 9:59 AM

## 2015-07-13 NOTE — Assessment & Plan Note (Signed)
Her symptoms of bone pain has improved. She has no new neurological deficit. She will continue to take high-dose vitamin D and oxycodone as needed.

## 2015-07-13 NOTE — Assessment & Plan Note (Signed)
This is likely due to recent treatment. The patient denies recent history of bleeding such as epistaxis, hematuria or hematochezia. She is asymptomatic from the anemia. I will observe for now.   

## 2015-07-16 NOTE — Telephone Encounter (Signed)
Per MD ok to add labs to same day as MD visit per staff msg... KJ

## 2015-07-26 ENCOUNTER — Encounter: Payer: Self-pay | Admitting: Hematology and Oncology

## 2015-07-26 NOTE — Progress Notes (Signed)
I faxed [phone] ltd forms.

## 2015-07-26 NOTE — Progress Notes (Signed)
I placed disability forms on desk of nurse for dr. Alvy Bimler

## 2015-08-07 ENCOUNTER — Encounter: Payer: Self-pay | Admitting: Hematology and Oncology

## 2015-08-08 ENCOUNTER — Other Ambulatory Visit: Payer: Self-pay | Admitting: *Deleted

## 2015-08-08 ENCOUNTER — Other Ambulatory Visit: Payer: Self-pay | Admitting: Hematology and Oncology

## 2015-08-08 DIAGNOSIS — C9000 Multiple myeloma not having achieved remission: Secondary | ICD-10-CM

## 2015-08-08 MED ORDER — LENALIDOMIDE 5 MG PO CAPS CTSU E1A11: ORAL_CAPSULE | ORAL | Status: DC

## 2015-08-10 ENCOUNTER — Other Ambulatory Visit: Payer: Self-pay | Admitting: *Deleted

## 2015-08-10 ENCOUNTER — Encounter: Payer: Self-pay | Admitting: Hematology and Oncology

## 2015-08-10 ENCOUNTER — Encounter: Payer: 59 | Admitting: *Deleted

## 2015-08-10 ENCOUNTER — Other Ambulatory Visit (HOSPITAL_BASED_OUTPATIENT_CLINIC_OR_DEPARTMENT_OTHER): Payer: 59

## 2015-08-10 DIAGNOSIS — C9000 Multiple myeloma not having achieved remission: Secondary | ICD-10-CM

## 2015-08-10 DIAGNOSIS — Z006 Encounter for examination for normal comparison and control in clinical research program: Secondary | ICD-10-CM

## 2015-08-10 LAB — CBC WITH DIFFERENTIAL/PLATELET
BASO%: 0.3 % (ref 0.0–2.0)
Basophils Absolute: 0 10*3/uL (ref 0.0–0.1)
EOS%: 3 % (ref 0.0–7.0)
Eosinophils Absolute: 0.1 10*3/uL (ref 0.0–0.5)
HCT: 37.4 % (ref 34.8–46.6)
HGB: 11.9 g/dL (ref 11.6–15.9)
LYMPH%: 52.7 % — ABNORMAL HIGH (ref 14.0–49.7)
MCH: 22.9 pg — ABNORMAL LOW (ref 25.1–34.0)
MCHC: 31.8 g/dL (ref 31.5–36.0)
MCV: 72.1 fL — ABNORMAL LOW (ref 79.5–101.0)
MONO#: 0.4 10*3/uL (ref 0.1–0.9)
MONO%: 11.8 % (ref 0.0–14.0)
NEUT#: 1.2 10*3/uL — ABNORMAL LOW (ref 1.5–6.5)
NEUT%: 32.2 % — ABNORMAL LOW (ref 38.4–76.8)
Platelets: 204 10*3/uL (ref 145–400)
RBC: 5.19 10*6/uL (ref 3.70–5.45)
RDW: 16.5 % — ABNORMAL HIGH (ref 11.2–14.5)
WBC: 3.6 10*3/uL — ABNORMAL LOW (ref 3.9–10.3)
lymph#: 1.9 10*3/uL (ref 0.9–3.3)
nRBC: 0 % (ref 0–0)

## 2015-08-10 MED ORDER — LENALIDOMIDE 5 MG PO CAPS CTSU E1A11: ORAL_CAPSULE | ORAL | Status: DC

## 2015-08-10 MED ORDER — LENALIDOMIDE 5 MG PO CAPS
5.0000 mg | ORAL_CAPSULE | Freq: Every day | ORAL | Status: DC
Start: 2015-08-10 — End: 2015-08-10

## 2015-08-10 NOTE — Progress Notes (Signed)
08/10/2015 Patient in to clinic today for lab appointment, prior to initiating maintenance treatment cycle 11. Patient prefers to be called with results as opposed to waiting at [facility] for final report. Patient is aware that results must be reviewed and approved for treatment, prior to resuming Revlimid for this cycle.  Patient states that she still has not received her Revlimid refill, but she expects it to come today. Patient states that when she calls the mail-order pharmacy, they note that they have not received a refill request, and do not appear to initiate contact with the physician to obtain a refill. Instructed patient to call [facility] and pharmacy on the day she takes her last dose (day 21 of cycle) to initiate the process for a refill. Should medication not arrive today, and results are acceptable for continued treatment, patient will begin medication when it arrives, and will notify research nurse via phone/voice mail of the delay.  Patient returned completed cycle 10 Patient Medication Calendar, confirming correct dosing as prescribed, with last dose taken on Thursday 08/02/2015. Patient given printed appointment calendar with Cycle 11 treatment days marked, given to patient today, to document doses.   Patient reports the following complaints during the past cycle: - mild pedal edema for one day - moderate fatigue with some limitations on her activities. Yesterday spent most of the day in bed but is improved today. 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   [name] BSN, RN, [place] 08/10/2015 8:53 AM   Spoke with patient by phone after CBC results received. Patient may continue treatment, as soon as Revlimid arrives, as ANC is 1200 and CBC results meet requirements for  re-treatment. [name] BSN, RN, CCRP 08/10/2015 9:00 AM

## 2015-09-03 ENCOUNTER — Other Ambulatory Visit: Payer: Self-pay | Admitting: *Deleted

## 2015-09-03 DIAGNOSIS — C9000 Multiple myeloma not having achieved remission: Secondary | ICD-10-CM

## 2015-09-03 MED ORDER — LENALIDOMIDE 5 MG PO CAPS CTSU E1A11: ORAL_CAPSULE | ORAL | Status: DC

## 2015-09-04 ENCOUNTER — Encounter: Payer: Self-pay | Admitting: Hematology and Oncology

## 2015-09-07 ENCOUNTER — Other Ambulatory Visit: Payer: Self-pay | Admitting: Hematology and Oncology

## 2015-09-07 ENCOUNTER — Encounter: Payer: 59 | Admitting: *Deleted

## 2015-09-07 ENCOUNTER — Encounter: Payer: Self-pay | Admitting: Hematology and Oncology

## 2015-09-07 ENCOUNTER — Encounter: Payer: Self-pay | Admitting: *Deleted

## 2015-09-07 ENCOUNTER — Other Ambulatory Visit (HOSPITAL_BASED_OUTPATIENT_CLINIC_OR_DEPARTMENT_OTHER): Payer: 59

## 2015-09-07 DIAGNOSIS — C9000 Multiple myeloma not having achieved remission: Secondary | ICD-10-CM | POA: Diagnosis not present

## 2015-09-07 DIAGNOSIS — R5383 Other fatigue: Secondary | ICD-10-CM | POA: Diagnosis not present

## 2015-09-07 DIAGNOSIS — Z006 Encounter for examination for normal comparison and control in clinical research program: Secondary | ICD-10-CM | POA: Diagnosis not present

## 2015-09-07 LAB — CBC WITH DIFFERENTIAL/PLATELET
BASO%: 0.5 % (ref 0.0–2.0)
Basophils Absolute: 0 10*3/uL (ref 0.0–0.1)
EOS%: 2.2 % (ref 0.0–7.0)
Eosinophils Absolute: 0.1 10*3/uL (ref 0.0–0.5)
HCT: 37.2 % (ref 34.8–46.6)
HGB: 11.5 g/dL — ABNORMAL LOW (ref 11.6–15.9)
LYMPH%: 54.8 % — ABNORMAL HIGH (ref 14.0–49.7)
MCH: 22.4 pg — ABNORMAL LOW (ref 25.1–34.0)
MCHC: 31 g/dL — ABNORMAL LOW (ref 31.5–36.0)
MCV: 72.4 fL — ABNORMAL LOW (ref 79.5–101.0)
MONO#: 0.4 10*3/uL (ref 0.1–0.9)
MONO%: 12.1 % (ref 0.0–14.0)
NEUT#: 1.1 10*3/uL — ABNORMAL LOW (ref 1.5–6.5)
NEUT%: 30.4 % — ABNORMAL LOW (ref 38.4–76.8)
Platelets: 217 10*3/uL (ref 145–400)
RBC: 5.13 10*6/uL (ref 3.70–5.45)
RDW: 16.5 % — ABNORMAL HIGH (ref 11.2–14.5)
WBC: 3.7 10*3/uL — ABNORMAL LOW (ref 3.9–10.3)
lymph#: 2 10*3/uL (ref 0.9–3.3)

## 2015-09-07 LAB — TSH CHCC: TSH: 1.36 m(IU)/L (ref 0.308–3.960)

## 2015-09-07 MED ORDER — LENALIDOMIDE 5 MG PO CAPS CTSU E1A11: ORAL_CAPSULE | ORAL | Status: DC

## 2015-09-07 NOTE — Progress Notes (Signed)
09/07/2015 Patient in to clinic this morning for lab work prior to beginning treatment cycle 12. Patient returned completed cycle 11 Patient Medication Calendar, confirming correct dosing as prescribed, with last dose taken on Thursday 08/30/2015. Patient given printed appointment calendar with Cycle 12 treatment days marked, to document doses for the upcoming cycle.  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 Alvy Bimler for continued treatment. Patient will start Revlimid 5mg /day dosing today, for 21 days. Patient is aware that x-rays will be scheduled for 10/28.  [name] BSN, RN, CCRP 09/07/2015 10:20 AM

## 2015-09-27 ENCOUNTER — Encounter: Payer: Self-pay | Admitting: *Deleted

## 2015-09-27 ENCOUNTER — Other Ambulatory Visit: Payer: Self-pay | Admitting: *Deleted

## 2015-09-27 DIAGNOSIS — C9000 Multiple myeloma not having achieved remission: Secondary | ICD-10-CM

## 2015-09-27 MED ORDER — LENALIDOMIDE 5 MG PO CAPS CTSU E1A11: ORAL_CAPSULE | ORAL | Status: DC

## 2015-09-28 ENCOUNTER — Ambulatory Visit (HOSPITAL_COMMUNITY)
Admission: RE | Admit: 2015-09-28 | Discharge: 2015-09-28 | Disposition: A | Discharge status: Home / Self Care | Payer: 59 | Source: Ambulatory Visit | Attending: Hematology and Oncology | Admitting: Hematology and Oncology

## 2015-09-28 ENCOUNTER — Encounter: Payer: Self-pay | Admitting: Hematology and Oncology

## 2015-09-28 ENCOUNTER — Other Ambulatory Visit (HOSPITAL_BASED_OUTPATIENT_CLINIC_OR_DEPARTMENT_OTHER): Payer: 59

## 2015-09-28 DIAGNOSIS — C9000 Multiple myeloma not having achieved remission: Secondary | ICD-10-CM | POA: Diagnosis not present

## 2015-09-28 DIAGNOSIS — Z006 Encounter for examination for normal comparison and control in clinical research program: Secondary | ICD-10-CM | POA: Diagnosis not present

## 2015-09-28 LAB — CBC WITH DIFFERENTIAL/PLATELET
BASO%: 0.3 % (ref 0.0–2.0)
Basophils Absolute: 0 10*3/uL (ref 0.0–0.1)
EOS%: 3.7 % (ref 0.0–7.0)
Eosinophils Absolute: 0.1 10*3/uL (ref 0.0–0.5)
HCT: 37.4 % (ref 34.8–46.6)
HGB: 11.8 g/dL (ref 11.6–15.9)
LYMPH%: 46.8 % (ref 14.0–49.7)
MCH: 23 pg — ABNORMAL LOW (ref 25.1–34.0)
MCHC: 31.6 g/dL (ref 31.5–36.0)
MCV: 72.9 fL — ABNORMAL LOW (ref 79.5–101.0)
MONO#: 0.4 10*3/uL (ref 0.1–0.9)
MONO%: 11.6 % (ref 0.0–14.0)
NEUT#: 1.1 10*3/uL — ABNORMAL LOW (ref 1.5–6.5)
NEUT%: 37.6 % — ABNORMAL LOW (ref 38.4–76.8)
Platelets: 202 10*3/uL (ref 145–400)
RBC: 5.13 10*6/uL (ref 3.70–5.45)
RDW: 16.2 % — ABNORMAL HIGH (ref 11.2–14.5)
WBC: 3 10*3/uL — ABNORMAL LOW (ref 3.9–10.3)
lymph#: 1.4 10*3/uL (ref 0.9–3.3)

## 2015-09-28 LAB — COMPREHENSIVE METABOLIC PANEL (CC13)
ALT: 43 U/L (ref 0–55)
AST: 27 U/L (ref 5–34)
Albumin: 3.6 g/dL (ref 3.5–5.0)
Alkaline Phosphatase: 63 U/L (ref 40–150)
Anion Gap: 7 mEq/L (ref 3–11)
BUN: 10.5 mg/dL (ref 7.0–26.0)
CO2: 27 mEq/L (ref 22–29)
Calcium: 9.5 mg/dL (ref 8.4–10.4)
Chloride: 107 mEq/L (ref 98–109)
Creatinine: 0.8 mg/dL (ref 0.6–1.1)
EGFR: >90 mL/min/{1.73_m2} (ref >90)
Glucose: 90 mg/dl (ref 70–140)
Potassium: 3.6 mEq/L (ref 3.5–5.1)
Sodium: 140 mEq/L (ref 136–145)
Total Bilirubin: 0.61 mg/dL (ref 0.20–1.20)
Total Protein: 7.5 g/dL (ref 6.4–8.3)

## 2015-09-28 LAB — LACTATE DEHYDROGENASE (CC13): LDH: 195 U/L (ref 125–245)

## 2015-10-02 ENCOUNTER — Other Ambulatory Visit: Payer: Self-pay

## 2015-10-02 DIAGNOSIS — Z1231 Encounter for screening mammogram for malignant neoplasm of breast: Secondary | ICD-10-CM

## 2015-10-02 LAB — SPEP & IFE WITH QIG
Albumin ELP: 3.9 g/dL (ref 3.8–4.8)
Alpha-1-Globulin: 0.3 g/dL (ref 0.2–0.3)
Alpha-2-Globulin: 0.6 g/dL (ref 0.5–0.9)
Beta 2: 0.4 g/dL (ref 0.2–0.5)
Beta Globulin: 0.5 g/dL (ref 0.4–0.6)
Gamma Globulin: 1.8 g/dL — ABNORMAL HIGH (ref 0.8–1.7)
IgA: 152 mg/dL (ref 69–380)
IgG (Immunoglobin G), Serum: 2070 mg/dL — ABNORMAL HIGH (ref 690–1700)
IgM, Serum: 62 mg/dL (ref 52–322)
Total Protein, Serum Electrophoresis: 7.3 g/dL (ref 6.1–8.1)

## 2015-10-02 LAB — KAPPA/LAMBDA LIGHT CHAINS
Kappa free light chain: 3.69 mg/dL — ABNORMAL HIGH (ref 0.33–1.94)
Kappa:Lambda Ratio: 1.63 (ref 0.26–1.65)
Lambda Free Lght Chn: 2.27 mg/dL (ref 0.57–2.63)

## 2015-10-03 ENCOUNTER — Telehealth: Payer: Self-pay | Admitting: *Deleted

## 2015-10-03 NOTE — Telephone Encounter (Signed)
10/03/2015 Spoke with patient by phone to notify her that Dr. Alvy Bimler has reviewed lab results from 09/28/2015 and that no additional tests will be needed this Friday. 10/05/15 lab appointment was cancelled, and patient only needs to come in for 8:30 MD appointment. [name] BSN, RN, [place] 10/03/2015 10:07 AM

## 2015-10-05 ENCOUNTER — Encounter: Payer: Self-pay | Admitting: Hematology and Oncology

## 2015-10-05 ENCOUNTER — Ambulatory Visit (HOSPITAL_BASED_OUTPATIENT_CLINIC_OR_DEPARTMENT_OTHER): Payer: 59 | Admitting: Hematology and Oncology

## 2015-10-05 ENCOUNTER — Telehealth: Payer: Self-pay | Admitting: Hematology and Oncology

## 2015-10-05 ENCOUNTER — Other Ambulatory Visit: Payer: 59

## 2015-10-05 ENCOUNTER — Encounter: Payer: 59 | Admitting: *Deleted

## 2015-10-05 VITALS — BP 134/78 | HR 72 | Temp 98.1°F | Resp 18 | Ht 67.0 in | Wt 171.1 lb

## 2015-10-05 DIAGNOSIS — D61811 Other drug-induced pancytopenia: Secondary | ICD-10-CM

## 2015-10-05 DIAGNOSIS — G43009 Migraine without aura, not intractable, without status migrainosus: Secondary | ICD-10-CM | POA: Diagnosis not present

## 2015-10-05 DIAGNOSIS — M898X9 Other specified disorders of bone, unspecified site: Secondary | ICD-10-CM

## 2015-10-05 DIAGNOSIS — Z006 Encounter for examination for normal comparison and control in clinical research program: Secondary | ICD-10-CM

## 2015-10-05 DIAGNOSIS — C9001 Multiple myeloma in remission: Secondary | ICD-10-CM

## 2015-10-05 DIAGNOSIS — C9000 Multiple myeloma not having achieved remission: Secondary | ICD-10-CM

## 2015-10-05 DIAGNOSIS — M899 Disorder of bone, unspecified: Secondary | ICD-10-CM

## 2015-10-05 MED ORDER — TRAMADOL HCL 50 MG PO TABS: 50.0000 mg | ORAL_TABLET | Freq: Four times a day (QID) | ORAL | Status: DC | PRN

## 2015-10-05 MED ORDER — LENALIDOMIDE 5 MG PO CAPS CTSU E1A11: ORAL_CAPSULE | ORAL | Status: DC

## 2015-10-05 MED ORDER — OXYCODONE HCL 10 MG PO TABS: 10.0000 mg | ORAL_TABLET | Freq: Three times a day (TID) | ORAL | Status: DC | PRN

## 2015-10-05 NOTE — Telephone Encounter (Signed)
per pof to sch pt appt-gave pt copy of avs °

## 2015-10-05 NOTE — Progress Notes (Signed)
10/05/2015 Patient in to clinic today for evaluation prior to beginning maintenance treatment Cycle 13. Met patient in lobby upon arrival, and Quality of Life assessments were completed independently by the patient prior to contact with healthcare providers.  Retrieved October calendar with Revlimid dosing marked. Patient was given a calendar to document November doses, with Cycle 13 treatment days marked.  Patient reports that she has experienced one day of mild diarrhea, with a maximum of 3 stools on that day. She has had moderate insomnia and moderate fatigue. Her neuropathy is felt to be unchanged (mild to moderate, grade 1-2).She has had mild nausea intermittently, and has ongoing occasional moderate migrane headaches, relieved by Tramadol. She reports episodes of what she describes as spine "inflammation" with an all-over sensation of "bugs crawling" associated with a sensation of swelling (but no visible swelling) and tightness (occurred on one day during the first week of treatment).  The following additional complaints were recorded by the patient on her medication calendar, and considered moderate in nature. - general pain - joint pain (hip, wrist) - back pain (takes one pain pill about 3x/week, for pain level of 7-8) - bone pain - extremity pain (legs)  Based on 10/28 lab and radiology results review and history and physical by Dr. Alvy Bimler, patient's condition is acceptable for continued dosing. Patient will begin maintenance treatment Cycle 13 today.  [name] BSN, RN, [place] 10/05/2015 10:17 AM

## 2015-10-06 NOTE — Assessment & Plan Note (Signed)
This is likely due to recent treatment. The patient denies recent history of fevers, cough, chills, diarrhea or dysuria. She is asymptomatic from the leukopenia. I will observe for now.  I will continue the chemotherapy at current dose without dosage adjustment.  If the leukopenia gets progressive worse in the future, I might have to delay her treatment or adjust the chemotherapy dose.   

## 2015-10-06 NOTE — Assessment & Plan Note (Signed)
Her symptoms of bone pain has improved. She has no new neurological deficit. She will continue to take high-dose vitamin D and oxycodone as needed. Due to history of osteonecrosis of the jaw, zometa is not prescribed

## 2015-10-06 NOTE — Progress Notes (Signed)
[place] OFFICE PROGRESS NOTE  Patient Care Team: Wenda Low, MD as PCP - General (Internal Medicine) Heath Lark, MD as Consulting Physician (Hematology and Oncology)  SUMMARY OF ONCOLOGIC HISTORY: Oncology History   ISS stage 1 IgG lambda subtype (serum albumin 3.6, Beta2 microglobulin 2.32) Durie Salmon Stage 1     Multiple myeloma in remission ([place])   10/10/2013 Imaging Skeletal survery was negative   11/09/2013 Bone Marrow Biopsy BM biopsy confirmed myeloma, 76% involved, IgG lambda subtype   12/06/2013 - 08/29/2014 Chemotherapy Sh received chemo with revlimid, Velcade, Dexamethasone and Zometa. 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   05/05/2014 Imaging Imaging study of the neck showed no explanation that could cause right neck pain. She is noted to have incidental left upper lung nodule. Plan to repeat imaging study in 3 months.   09/06/2014 Imaging Bone survey showed no evidence of fracture   09/14/2014 Bone Marrow Biopsy Bone marrow biopsy showed 8% residual plasma cells by manual count but none on the biopsy specimen   09/26/2014 -  Chemotherapy She is started on cycle 1 of maintenance Revlimid   01/31/2015 Imaging  chest x-ray showed pneumonia. Treatment was placed on hold.   05/03/2015 Bone Marrow Biopsy Accession: [id number] repeat bone marrow aspirate and biopsy show 5% residual plasma cells    INTERVAL HISTORY: Please see below for problem oriented charting. She feels well. Denies recent infection Denies side effects of treatment She continued to have chronic headaches and musculoskeletal pain throughout Has fatigue Occasional nausea and loose stool, unchanged  REVIEW OF SYSTEMS:   Constitutional: Denies fevers, chills or abnormal weight loss Eyes: Denies blurriness of vision Ears,  nose, mouth, throat, and face: Denies mucositis or sore throat Respiratory: Denies cough, dyspnea or wheezes Cardiovascular: Denies palpitation, chest discomfort or lower extremity swelling Skin: Denies abnormal skin rashes Lymphatics: Denies new lymphadenopathy or easy bruising Neurological:Denies numbness, tingling or new weaknesses Behavioral/Psych: Mood is stable, no new changes  All other systems were reviewed with the patient and are negative.  I have reviewed the past medical history, past surgical history, social history and family history with the patient and they are unchanged from previous note.  ALLERGIES:  has No Known Allergies.  MEDICATIONS:  Current Outpatient Prescriptions  Medication Sig Dispense Refill  . aspirin 325 MG tablet Take 325 mg by mouth daily.    . carboxymethylcellulose (REFRESH PLUS) 0.5 % SOLN Place 1 drop into both eyes daily as needed (for dry eyes).     . Cholecalciferol (VITAMIN D3) 2000 UNITS TABS Take 2,000 Units by mouth daily.    . hydrochlorothiazide (HYDRODIURIL) 25 MG tablet Take 25 mg by mouth daily with breakfast.     . levothyroxine (SYNTHROID, LEVOTHROID) 75 MCG tablet Take 75 mcg by mouth daily before breakfast.    . Multiple Vitamins-Minerals (CENTRUM SILVER PO) Take 1 tablet by mouth daily.     . ONGLYZA 5 MG TABS tablet Take 5 mg by mouth.    . Oxycodone HCl 10 MG TABS Take 1 tablet (10 mg total) by mouth 3 (three) times daily as needed (for pain). 90 tablet 0  . polyethylene glycol (MIRALAX / GLYCOLAX) packet Take 17 g by mouth daily.    . traMADol (ULTRAM) 50 MG tablet Take 1 tablet (50 mg total) by mouth every 6 (six)  hours as needed for moderate pain. 90 tablet 0  . venlafaxine XR (EFFEXOR-XR) 75 MG 24 hr capsule Take 1 capsule (75 mg total) by mouth daily with breakfast. 30 capsule 11  . verapamil (VERELAN PM) 120 MG 24 hr capsule Take 1 capsule by mouth  daily 30 capsule 11  . lenalidomide (REVLIMID) 5 MG capsule Take 1 capsule  daily on days 1-21. Repeat every 28 days. 21 capsule 0   No current facility-administered medications for this visit.    PHYSICAL EXAMINATION: ECOG PERFORMANCE STATUS: 1 - Symptomatic but completely ambulatory  Filed Vitals:   10/05/15 0813  BP: 134/78  Pulse: 72  Temp: 98.1 F (36.7 C)  Resp: 18   Filed Weights   10/05/15 0813  Weight: 171 lb 1.6 oz (77.61 kg)    GENERAL:alert, no distress and comfortable SKIN: skin color, texture, turgor are normal, no rashes or significant lesions EYES: normal, Conjunctiva are pink and non-injected, sclera clear OROPHARYNX:no exudate, no erythema and lips, buccal mucosa, and tongue normal  NECK: supple, thyroid normal size, non-tender, without nodularity LYMPH:  no palpable lymphadenopathy in the cervical, axillary or inguinal LUNGS: clear to auscultation and percussion with normal breathing effort HEART: regular rate & rhythm and no murmurs and no lower extremity edema ABDOMEN:abdomen soft, non-tender and normal bowel sounds Musculoskeletal:no cyanosis of digits and no clubbing  NEURO: alert & oriented x 3 with fluent speech, no focal motor/sensory deficits  LABORATORY DATA:  I have reviewed the data as listed    Component Value Date/Time   NA 140 09/28/2015 0754   NA 142 03/16/2015 1012   K 3.6 09/28/2015 0754   K 3.6 03/16/2015 1012   CL 106 03/16/2015 1012   CO2 27 09/28/2015 0754   CO2 29 03/16/2015 1012   GLUCOSE 90 09/28/2015 0754   GLUCOSE 89 03/16/2015 1012   BUN 10.5 09/28/2015 0754   BUN 12 03/16/2015 1012   CREATININE 0.8 09/28/2015 0754   CREATININE 0.86 03/16/2015 1012   CALCIUM 9.5 09/28/2015 0754   CALCIUM 9.3 03/16/2015 1012   PROT 7.5 09/28/2015 0754   PROT 7.4 03/16/2015 1012   ALBUMIN 3.6 09/28/2015 0754   ALBUMIN 3.9 03/16/2015 1012   AST 27 09/28/2015 0754   AST 24 03/16/2015 1012   ALT 43 09/28/2015 0754   ALT 30 03/16/2015 1012   ALKPHOS 63 09/28/2015 0754   ALKPHOS 54 03/16/2015 1012   BILITOT  0.61 09/28/2015 0754   BILITOT 0.4 03/16/2015 1012   GFRNONAA 73* 03/16/2015 1012   GFRAA 85* 03/16/2015 1012    No results found for: SPEP, UPEP  Lab Results  Component Value Date   WBC 3.0* 09/28/2015   NEUTROABS 1.1* 09/28/2015   HGB 11.8 09/28/2015   HCT 37.4 09/28/2015   MCV 72.9* 09/28/2015   PLT 202 09/28/2015      Chemistry      Component Value Date/Time   NA 140 09/28/2015 0754   NA 142 03/16/2015 1012   K 3.6 09/28/2015 0754   K 3.6 03/16/2015 1012   CL 106 03/16/2015 1012   CO2 27 09/28/2015 0754   CO2 29 03/16/2015 1012   BUN 10.5 09/28/2015 0754   BUN 12 03/16/2015 1012   CREATININE 0.8 09/28/2015 0754   CREATININE 0.86 03/16/2015 1012      Component Value Date/Time   CALCIUM 9.5 09/28/2015 0754   CALCIUM 9.3 03/16/2015 1012   ALKPHOS 63 09/28/2015 0754   ALKPHOS 54 03/16/2015 1012   AST  27 09/28/2015 0754   AST 24 03/16/2015 1012   ALT 43 09/28/2015 0754   ALT 30 03/16/2015 1012   BILITOT 0.61 09/28/2015 0754   BILITOT 0.4 03/16/2015 1012       RADIOGRAPHIC STUDIES:I reviewed the skeletal survey I have personally reviewed the radiological images as listed and agreed with the findings in the report.   ASSESSMENT & PLAN:  Multiple myeloma in remission [facility]) She is doing well clinically while on treatment. Continue Revlimid at current dose adjustment. Recent bone marrow aspirate and biopsy from 05/03/2015 show residual plasma cells and the patient remained in VGPR I will see her per protocol in 3 months  Drug-induced pancytopenia ([place]) This is likely due to recent treatment. The patient denies recent history of fevers, cough, chills, diarrhea or dysuria. She is asymptomatic from the leukopenia. I will observe for now.  I will continue the chemotherapy at current dose without dosage adjustment.  If the leukopenia gets progressive worse in the future, I might have to delay her treatment or adjust the chemotherapy dose.    Migraine without  aura She is taking Tramadol for this I recommend follow-up with neurology  Bone pain Her symptoms of bone pain has improved. She has no new neurological deficit. She will continue to take high-dose vitamin D and oxycodone as needed. Due to history of osteonecrosis of the jaw, zometa is not prescribed    Orders Placed This Encounter  Procedures  . Comprehensive metabolic panel    Standing Status: Future     Number of Occurrences:      Standing Expiration Date: 11/08/2016  . SPEP & IFE with QIG    Standing Status: Future     Number of Occurrences:      Standing Expiration Date: 11/08/2016  . Kappa/lambda light chains    Standing Status: Future     Number of Occurrences:      Standing Expiration Date: 11/08/2016  . Beta 2 microglobulin, serum    Standing Status: Future     Number of Occurrences:      Standing Expiration Date: 11/08/2016   All questions were answered. The patient knows to call the clinic with any problems, questions or concerns. No barriers to learning was detected. I spent 25 minutes counseling the patient face to face. The total time spent in the appointment was 30 minutes and more than 50% was on counseling and review of test results     [facility], [place], MD 10/06/2015 8:35 AM

## 2015-10-06 NOTE — Assessment & Plan Note (Signed)
She is taking Tramadol for this I recommend follow-up with neurology

## 2015-10-06 NOTE — Assessment & Plan Note (Signed)
She is doing well clinically while on treatment. Continue Revlimid at current dose adjustment. 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  

## 2015-10-24 ENCOUNTER — Ambulatory Visit
Admission: RE | Admit: 2015-10-24 | Discharge: 2015-10-24 | Disposition: A | Discharge status: Home / Self Care | Payer: 59 | Source: Ambulatory Visit

## 2015-10-24 DIAGNOSIS — Z1231 Encounter for screening mammogram for malignant neoplasm of breast: Secondary | ICD-10-CM

## 2015-10-28 ENCOUNTER — Encounter: Payer: Self-pay | Admitting: Hematology and Oncology

## 2015-10-30 ENCOUNTER — Other Ambulatory Visit: Payer: Self-pay | Admitting: Hematology and Oncology

## 2015-10-30 DIAGNOSIS — C9000 Multiple myeloma not having achieved remission: Secondary | ICD-10-CM

## 2015-10-30 MED ORDER — LENALIDOMIDE 5 MG PO CAPS CTSU E1A11: ORAL_CAPSULE | ORAL | Status: DC

## 2015-11-02 ENCOUNTER — Encounter: Payer: 59 | Admitting: *Deleted

## 2015-11-02 ENCOUNTER — Encounter: Payer: Self-pay | Admitting: Hematology and Oncology

## 2015-11-02 ENCOUNTER — Other Ambulatory Visit (HOSPITAL_BASED_OUTPATIENT_CLINIC_OR_DEPARTMENT_OTHER): Payer: 59

## 2015-11-02 DIAGNOSIS — C9000 Multiple myeloma not having achieved remission: Secondary | ICD-10-CM | POA: Diagnosis not present

## 2015-11-02 DIAGNOSIS — Z006 Encounter for examination for normal comparison and control in clinical research program: Secondary | ICD-10-CM | POA: Diagnosis not present

## 2015-11-02 DIAGNOSIS — C9001 Multiple myeloma in remission: Secondary | ICD-10-CM

## 2015-11-02 LAB — CBC WITH DIFFERENTIAL/PLATELET
BASO%: 0.3 % (ref 0.0–2.0)
Basophils Absolute: 0 10*3/uL (ref 0.0–0.1)
EOS%: 2.4 % (ref 0.0–7.0)
Eosinophils Absolute: 0.1 10*3/uL (ref 0.0–0.5)
HCT: 36.5 % (ref 34.8–46.6)
HGB: 11.2 g/dL — ABNORMAL LOW (ref 11.6–15.9)
LYMPH%: 52 % — ABNORMAL HIGH (ref 14.0–49.7)
MCH: 22.5 pg — ABNORMAL LOW (ref 25.1–34.0)
MCHC: 30.6 g/dL — ABNORMAL LOW (ref 31.5–36.0)
MCV: 73.7 fL — ABNORMAL LOW (ref 79.5–101.0)
MONO#: 0.4 10*3/uL (ref 0.1–0.9)
MONO%: 13.3 % (ref 0.0–14.0)
NEUT#: 1.1 10*3/uL — ABNORMAL LOW (ref 1.5–6.5)
NEUT%: 32 % — ABNORMAL LOW (ref 38.4–76.8)
Platelets: 209 10*3/uL (ref 145–400)
RBC: 4.95 10*6/uL (ref 3.70–5.45)
RDW: 16.5 % — ABNORMAL HIGH (ref 11.2–14.5)
WBC: 3.3 10*3/uL — ABNORMAL LOW (ref 3.9–10.3)
lymph#: 1.7 10*3/uL (ref 0.9–3.3)

## 2015-11-02 MED ORDER — LENALIDOMIDE 5 MG PO CAPS CTSU E1A11: ORAL_CAPSULE | ORAL | Status: DC

## 2015-11-02 NOTE — Progress Notes (Signed)
11/02/2015 Patient in to clinic this morning for lab work prior to beginning treatment cycle 14. Patient returned completed cycle 13 Patient Medication Calendar, confirming correct dosing as prescribed. Patient given printed appointment calendar with Cycle 14 treatment days marked, to document doses for the upcoming cycle.  Patient reports the following complaints during the past cycle: - moderate fatigue with some limitations on her activities (grade 2) - mild insomnia - sensory neuropathy is generally the same, and ranges in grade from grade 1 to grade 2 - patient reported feeling down one day, with mild depressive symptoms that resolved - continues to experience moderate bone pain, headaches and back pain - occasional moderate hip, knee and wrist pain  Spoke with patient by phone after CBC results were received, with values within limits for continuing therapy. Patient will start Revlimid 5mg /day dosing today, for 21 days.   [name] BSN, RN, [place] 11/02/2015 9:13 AM

## 2015-11-22 ENCOUNTER — Encounter: Payer: Self-pay | Admitting: Hematology and Oncology

## 2015-11-22 NOTE — Progress Notes (Signed)
Mailed Life Insurance form on 11/21/15 to [employer]. Forwarded to medical records.

## 2015-11-22 NOTE — Progress Notes (Signed)
Faxed disability  forms to [place] at [phone]..forwarded to Medical Records.

## 2015-11-27 ENCOUNTER — Other Ambulatory Visit: Payer: Self-pay | Admitting: *Deleted

## 2015-11-27 DIAGNOSIS — C9001 Multiple myeloma in remission: Secondary | ICD-10-CM

## 2015-11-27 MED ORDER — LENALIDOMIDE 5 MG PO CAPS CTSU E1A11: ORAL_CAPSULE | ORAL | Status: DC

## 2015-11-28 ENCOUNTER — Other Ambulatory Visit: Payer: Self-pay | Admitting: Hematology and Oncology

## 2015-11-28 ENCOUNTER — Telehealth: Payer: Self-pay | Admitting: *Deleted

## 2015-11-28 DIAGNOSIS — M898X9 Other specified disorders of bone, unspecified site: Secondary | ICD-10-CM

## 2015-11-28 MED ORDER — HYDROMORPHONE HCL 2 MG PO TABS: 2.0000 mg | ORAL_TABLET | Freq: Four times a day (QID) | ORAL | Status: DC | PRN

## 2015-11-28 NOTE — Telephone Encounter (Signed)
Discussed note below with Ms Narine. Will get dilaudid rx and will follow up with nurse on Friday when she comes for labs

## 2015-11-28 NOTE — Telephone Encounter (Signed)
Patient left voicemail stating that she would like to have an oxycodone alternative. Patient is having left sided numbness with left arm/face whenever she takes this medication. Please advise. Message sent to MD Gorsuch/RN Tammi.

## 2015-11-28 NOTE — Telephone Encounter (Signed)
That is not a side-effect of oxycodone She is on Revlimid. That could increase risk of blood clot/stroke. If she has persistent symptoms, she needs evaluation to exclude TIA For oxycodone alternative, I could prescribe Dilaudid. When would she need a refill of pain meds?

## 2015-11-28 NOTE — Telephone Encounter (Signed)
err

## 2015-11-30 ENCOUNTER — Other Ambulatory Visit (HOSPITAL_BASED_OUTPATIENT_CLINIC_OR_DEPARTMENT_OTHER): Payer: 59

## 2015-11-30 ENCOUNTER — Encounter: Payer: Self-pay | Admitting: *Deleted

## 2015-11-30 ENCOUNTER — Encounter: Payer: Self-pay | Admitting: Hematology and Oncology

## 2015-11-30 DIAGNOSIS — C9000 Multiple myeloma not having achieved remission: Secondary | ICD-10-CM

## 2015-11-30 DIAGNOSIS — Z006 Encounter for examination for normal comparison and control in clinical research program: Secondary | ICD-10-CM

## 2015-11-30 DIAGNOSIS — C9001 Multiple myeloma in remission: Secondary | ICD-10-CM

## 2015-11-30 DIAGNOSIS — R5383 Other fatigue: Secondary | ICD-10-CM

## 2015-11-30 LAB — CBC WITH DIFFERENTIAL/PLATELET
BASO%: 0.3 % (ref 0.0–2.0)
Basophils Absolute: 0 10*3/uL (ref 0.0–0.1)
EOS%: 2.5 % (ref 0.0–7.0)
Eosinophils Absolute: 0.1 10*3/uL (ref 0.0–0.5)
HCT: 37.9 % (ref 34.8–46.6)
HGB: 11.8 g/dL (ref 11.6–15.9)
LYMPH%: 59.4 % — ABNORMAL HIGH (ref 14.0–49.7)
MCH: 23 pg — ABNORMAL LOW (ref 25.1–34.0)
MCHC: 31.1 g/dL — ABNORMAL LOW (ref 31.5–36.0)
MCV: 74 fL — ABNORMAL LOW (ref 79.5–101.0)
MONO#: 0.4 10*3/uL (ref 0.1–0.9)
MONO%: 10.1 % (ref 0.0–14.0)
NEUT#: 1 10*3/uL — ABNORMAL LOW (ref 1.5–6.5)
NEUT%: 27.7 % — ABNORMAL LOW (ref 38.4–76.8)
Platelets: 207 10*3/uL (ref 145–400)
RBC: 5.12 10*6/uL (ref 3.70–5.45)
RDW: 15.9 % — ABNORMAL HIGH (ref 11.2–14.5)
WBC: 3.6 10*3/uL — ABNORMAL LOW (ref 3.9–10.3)
lymph#: 2.1 10*3/uL (ref 0.9–3.3)

## 2015-11-30 LAB — TSH: TSH: 1.167 m(IU)/L (ref 0.308–3.960)

## 2015-11-30 MED ORDER — LENALIDOMIDE 5 MG PO CAPS CTSU E1A11: ORAL_CAPSULE | ORAL | Status: DC

## 2015-11-30 NOTE — Progress Notes (Signed)
11/30/15 at 8:42am- Patient in to the clinic this morning for lab work (CBC/diff and TSH) prior to beginning treatment cycle 15.  Patient returned completed cycle 14 Patient Medication Calendar, confirming correct dosing as prescribed.  Patient given printed appointment calendar with cycle 15 treatment days marked, to document doses for the upcoming cycle.    Patient reports the following complaints during this past cycle: -mild fatigue (grade 1)- pt reports her fatigue is relieved by rest -mild dyspnea (grade 1)- pt reports shortness of breath with moderate exertion -mild muscle weakness- trunk (grade 1)- pt reports mild symptoms of weakness -sensory neuropathy- pt denies worsening symptoms.  She reports her neuropathy symptoms as "stable" Upon review of her December calendar, the pt reported the following complaints:  Back/spine pain, hip pain, wrist pain, bone pain, leg pain, face numbness related to migraine headaches which began on 11/27/15.    The pt was seen by Sharlynn Oliphant, Dr. Calton Dach nurse.  The pt reported that she had not begun her new pain pills.  The pt stated that her left-sided facial numbness was improving today.  She said that she has had similar experiences in the past with numbness related to her history of migraines.  The research nurse obtained the pt's cbc/diff results and took the results to Dr. Alvy Bimler to review.  Dr. Alvy Bimler agreed that the pt should start her cycle 15 lenalidomide today.  Dr. Alvy Bimler was informed of the pt's complaints of numbness.  Dr. Alvy Bimler said that if her symptoms are improving then she would not evaluate her today.  Dr. Alvy Bimler said that if her symptoms worsen, then she would send the pt to a neurologist for consultation.  The pt was shown her cbc/diff results and instructed to begin her study treatment today.  The pt verbalized understanding.  The pt was informed to call Dr. Alvy Bimler if her left-sided numbness worsens.  The pt confirmed that she received  her study treatment in the mail on 11/29/15.  The pt was told that Tyrell Antonio, research nurse, would contact the pt about any updates to her January 2017 schedule.   Brion Aliment RN, BSN, CCRP Clinical Research Nurse 11/30/2015 9:12 AM

## 2015-12-14 ENCOUNTER — Encounter: Payer: Self-pay | Admitting: Hematology and Oncology

## 2015-12-14 NOTE — Progress Notes (Signed)
Patient left message notes were needed to be faxed to [facility]. I sent with last labs also.

## 2015-12-18 ENCOUNTER — Other Ambulatory Visit: Payer: Self-pay | Admitting: *Deleted

## 2015-12-18 ENCOUNTER — Telehealth: Payer: Self-pay | Admitting: *Deleted

## 2015-12-18 DIAGNOSIS — C9001 Multiple myeloma in remission: Secondary | ICD-10-CM

## 2015-12-18 NOTE — Telephone Encounter (Signed)
12/18/2015 Spoke with patient by phone to let her know that research nurse will not see her at this week's appointment for lab. Plans for the upcoming cycles were explained to the patient as follows: - Lab tests scheduled for this Thursday 12/20/15, one week in advance of MD visit, to allow time for myeloma panel results to be completed prior to MD visit. - MD visit one day early on 12/27/15. Patient to return completed pill calendar on that day. - Treatment will begin on Friday 12/28/15 for the next cycle of treatment. Patient voiced understanding of this plan. [name] BSN, RN, [place] 12/18/2015 9:21 AM

## 2015-12-19 ENCOUNTER — Encounter: Payer: Self-pay | Admitting: Hematology and Oncology

## 2015-12-19 NOTE — Progress Notes (Signed)
I left message for patient to advise what forms need to go to [place]? Last ones were done by dclark on 11/21/15

## 2015-12-20 ENCOUNTER — Encounter: Payer: Self-pay | Admitting: *Deleted

## 2015-12-20 ENCOUNTER — Encounter: Payer: Self-pay | Admitting: Hematology and Oncology

## 2015-12-20 ENCOUNTER — Encounter (HOSPITAL_BASED_OUTPATIENT_CLINIC_OR_DEPARTMENT_OTHER): Payer: BLUE CROSS/BLUE SHIELD | Admitting: Hematology and Oncology

## 2015-12-20 ENCOUNTER — Other Ambulatory Visit: Payer: Self-pay | Admitting: *Deleted

## 2015-12-20 DIAGNOSIS — Z006 Encounter for examination for normal comparison and control in clinical research program: Secondary | ICD-10-CM

## 2015-12-20 DIAGNOSIS — C9000 Multiple myeloma not having achieved remission: Secondary | ICD-10-CM | POA: Diagnosis not present

## 2015-12-20 DIAGNOSIS — C9001 Multiple myeloma in remission: Secondary | ICD-10-CM

## 2015-12-20 LAB — CBC WITH DIFFERENTIAL/PLATELET
BASO%: 0.5 % (ref 0.0–2.0)
Basophils Absolute: 0 10*3/uL (ref 0.0–0.1)
EOS%: 3.8 % (ref 0.0–7.0)
Eosinophils Absolute: 0.1 10*3/uL (ref 0.0–0.5)
HCT: 38.7 % (ref 34.8–46.6)
HGB: 11.9 g/dL (ref 11.6–15.9)
LYMPH%: 47.5 % (ref 14.0–49.7)
MCH: 22.6 pg — ABNORMAL LOW (ref 25.1–34.0)
MCHC: 30.7 g/dL — ABNORMAL LOW (ref 31.5–36.0)
MCV: 73.4 fL — ABNORMAL LOW (ref 79.5–101.0)
MONO#: 0.4 10*3/uL (ref 0.1–0.9)
MONO%: 14.2 % — ABNORMAL HIGH (ref 0.0–14.0)
NEUT#: 1.1 10*3/uL — ABNORMAL LOW (ref 1.5–6.5)
NEUT%: 34 % — ABNORMAL LOW (ref 38.4–76.8)
Platelets: 207 10*3/uL (ref 145–400)
RBC: 5.28 10*6/uL (ref 3.70–5.45)
RDW: 15.6 % — ABNORMAL HIGH (ref 11.2–14.5)
WBC: 3.1 10*3/uL — ABNORMAL LOW (ref 3.9–10.3)
lymph#: 1.5 10*3/uL (ref 0.9–3.3)

## 2015-12-20 LAB — COMPREHENSIVE METABOLIC PANEL
ALT: 33 U/L (ref 0–55)
AST: 23 U/L (ref 5–34)
Albumin: 3.7 g/dL (ref 3.5–5.0)
Alkaline Phosphatase: 61 U/L (ref 40–150)
Anion Gap: 8 mEq/L (ref 3–11)
BUN: 10 mg/dL (ref 7.0–26.0)
CO2: 26 mEq/L (ref 22–29)
Calcium: 9.4 mg/dL (ref 8.4–10.4)
Chloride: 105 mEq/L (ref 98–109)
Creatinine: 0.9 mg/dL (ref 0.6–1.1)
EGFR: 82 mL/min/{1.73_m2} — ABNORMAL LOW (ref >90)
Glucose: 92 mg/dl (ref 70–140)
Potassium: 3.8 mEq/L (ref 3.5–5.1)
Sodium: 139 mEq/L (ref 136–145)
Total Bilirubin: 0.77 mg/dL (ref 0.20–1.20)
Total Protein: 7.8 g/dL (ref 6.4–8.3)

## 2015-12-20 LAB — LACTATE DEHYDROGENASE: LDH: 180 U/L (ref 125–245)

## 2015-12-20 MED ORDER — LENALIDOMIDE 5 MG PO CAPS CTSU E1A11: ORAL_CAPSULE | ORAL | Status: DC

## 2015-12-20 NOTE — Progress Notes (Signed)
I faxed notes to [phone] [employer].

## 2015-12-20 NOTE — Telephone Encounter (Signed)
Faxed Revlimid Refill to [facility] [phone].

## 2015-12-21 LAB — KAPPA/LAMBDA LIGHT CHAINS
Ig Kappa Free Light Chain: 30.52 mg/L — ABNORMAL HIGH (ref 3.30–19.40)
Ig Lambda Free Light Chain: 22.22 mg/L (ref 5.71–26.30)
Kappa/Lambda FluidC Ratio: 1.37 (ref 0.26–1.65)

## 2015-12-21 LAB — BETA 2 MICROGLOBULIN, SERUM: Beta-2: 1.6 mg/L (ref 0.6–2.4)

## 2015-12-24 ENCOUNTER — Encounter: Payer: Self-pay | Admitting: Hematology and Oncology

## 2015-12-24 LAB — MULTIPLE MYELOMA PANEL, SERUM
Albumin SerPl Elph-Mcnc: 3.6 g/dL (ref 2.9–4.4)
Albumin/Glob SerPl: 1 (ref 0.7–1.7)
Alpha 1: 0.2 g/dL (ref 0.0–0.4)
Alpha2 Glob SerPl Elph-Mcnc: 0.6 g/dL (ref 0.4–1.0)
B-Globulin SerPl Elph-Mcnc: 1.2 g/dL (ref 0.7–1.3)
Gamma Glob SerPl Elph-Mcnc: 1.7 g/dL (ref 0.4–1.8)
Globulin, Total: 3.7 g/dL (ref 2.2–3.9)
IgA, Qn, Serum: 139 mg/dL (ref 87–352)
IgG, Qn, Serum: 1889 mg/dL — ABNORMAL HIGH (ref 700–1600)
IgM, Qn, Serum: 60 mg/dL (ref 26–217)
Total Protein: 7.3 g/dL (ref 6.0–8.5)

## 2015-12-25 ENCOUNTER — Encounter: Payer: Self-pay | Admitting: Hematology and Oncology

## 2015-12-25 NOTE — Progress Notes (Signed)
faxed office notes/labs to [employer] [phone]. See prev notes- I recd note for medical release today. I will fax again

## 2015-12-26 ENCOUNTER — Encounter: Payer: Self-pay | Admitting: Hematology and Oncology

## 2015-12-26 NOTE — Progress Notes (Signed)
I faxed [employer] auth form to [employer] for revlimid

## 2015-12-27 ENCOUNTER — Ambulatory Visit (HOSPITAL_BASED_OUTPATIENT_CLINIC_OR_DEPARTMENT_OTHER): Payer: BLUE CROSS/BLUE SHIELD | Admitting: Hematology and Oncology

## 2015-12-27 ENCOUNTER — Encounter: Payer: Self-pay | Admitting: Hematology and Oncology

## 2015-12-27 ENCOUNTER — Encounter: Payer: BLUE CROSS/BLUE SHIELD | Admitting: *Deleted

## 2015-12-27 ENCOUNTER — Telehealth: Payer: Self-pay | Admitting: Hematology and Oncology

## 2015-12-27 VITALS — BP 132/77 | HR 77 | Temp 98.8°F | Resp 18 | Wt 170.4 lb

## 2015-12-27 DIAGNOSIS — R197 Diarrhea, unspecified: Secondary | ICD-10-CM | POA: Diagnosis not present

## 2015-12-27 DIAGNOSIS — G629 Polyneuropathy, unspecified: Secondary | ICD-10-CM

## 2015-12-27 DIAGNOSIS — D61818 Other pancytopenia: Secondary | ICD-10-CM | POA: Insufficient documentation

## 2015-12-27 DIAGNOSIS — D61811 Other drug-induced pancytopenia: Secondary | ICD-10-CM

## 2015-12-27 DIAGNOSIS — Z006 Encounter for examination for normal comparison and control in clinical research program: Secondary | ICD-10-CM

## 2015-12-27 DIAGNOSIS — G620 Drug-induced polyneuropathy: Secondary | ICD-10-CM

## 2015-12-27 DIAGNOSIS — T451X5A Adverse effect of antineoplastic and immunosuppressive drugs, initial encounter: Secondary | ICD-10-CM

## 2015-12-27 DIAGNOSIS — C9001 Multiple myeloma in remission: Secondary | ICD-10-CM | POA: Diagnosis not present

## 2015-12-27 DIAGNOSIS — M898X9 Other specified disorders of bone, unspecified site: Secondary | ICD-10-CM

## 2015-12-27 DIAGNOSIS — G6289 Other specified polyneuropathies: Secondary | ICD-10-CM

## 2015-12-27 DIAGNOSIS — M899 Disorder of bone, unspecified: Secondary | ICD-10-CM

## 2015-12-27 HISTORY — DX: Polyneuropathy, unspecified: G62.9

## 2015-12-27 HISTORY — DX: Other pancytopenia: D61.818

## 2015-12-27 NOTE — Assessment & Plan Note (Signed)
She has history of chronic diarrhea, could be related to side effects of Revlimid. She continues conservative management.

## 2015-12-27 NOTE — Telephone Encounter (Signed)
per pof to sch pt appt-gave pt copy of avs °

## 2015-12-27 NOTE — Assessment & Plan Note (Signed)
She has history of neuropathy and recently complained of diffuse tingling and  Numbness sensation. I will check vitamin B-12 in the next visit for assessment

## 2015-12-27 NOTE — Progress Notes (Signed)
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   05/05/2014 Imaging Imaging study of the neck showed no explanation that could cause right neck pain. She is noted to have incidental left upper lung nodule. Plan to repeat imaging study in 3 months.   09/06/2014 Imaging Bone survey showed no evidence of fracture   09/14/2014 Bone Marrow Biopsy Bone marrow biopsy showed 8% residual plasma cells by manual count but none on the biopsy specimen   09/26/2014 -  Chemotherapy She is started on cycle 1 of maintenance Revlimid   01/31/2015 Imaging  chest x-ray showed pneumonia. Treatment was placed on hold.   05/03/2015 Bone Marrow Biopsy Accession: [id number] repeat bone marrow aspirate and biopsy show 5% residual plasma cells    INTERVAL HISTORY: Please see below for problem oriented charting.  she returns for further follow-up. She continues to have chronic diffuse muscular skeletal pain that comes and goes. She has prescription of oxycodone, Dilaudid and tramadol to take as needed. She started water aerobics 3 times a week and is doing better. She denies recent dental issues. No recent infection. She continues to have  chronic, intermittent diarrhea but it does not bother her.  she also complains of intermittent numbness which is diffuse in nature, more perceived after she wakes up but not noticeable during daytime. She denies drinking alcohol. She has history of diabetes  REVIEW OF SYSTEMS:   Constitutional: Denies fevers, chills or abnormal weight loss Eyes: Denies blurriness of vision Ears, nose, mouth, throat, and face: Denies mucositis or sore throat Respiratory: Denies cough, dyspnea or wheezes Cardiovascular: Denies palpitation, chest discomfort or lower extremity swelling Skin: Denies abnormal skin rashes Lymphatics: Denies new lymphadenopathy or easy bruising Behavioral/Psych: Mood is stable, no new changes  All other systems were reviewed with the patient and are negative.  I have reviewed the past medical history, past surgical history, social history and family history with the patient and they are unchanged from previous note.  ALLERGIES:  has No Known Allergies.  MEDICATIONS:  Current Outpatient Prescriptions  Medication Sig Dispense Refill  . aspirin 325 MG tablet Take 325 mg by mouth daily.    . carboxymethylcellulose (REFRESH PLUS) 0.5 % SOLN Place 1 drop into both eyes daily as needed (for dry eyes).     . Cholecalciferol (VITAMIN D3) 2000 UNITS TABS Take 2,000 Units by mouth daily.    . hydrochlorothiazide (HYDRODIURIL) 25 MG tablet Take 25 mg by mouth daily with breakfast.     . HYDROmorphone (DILAUDID) 2 MG tablet Take 1 tablet (2 mg total) by mouth every 6 (six) hours as needed for severe pain. 60 tablet 0  . lenalidomide (REVLIMID) 5 MG capsule Take 1 capsule daily on days 1-21. Repeat every 28 days. 21 capsule  0  . levothyroxine (SYNTHROID, LEVOTHROID) 75 MCG tablet Take 75 mcg by mouth daily before breakfast.    . Multiple Vitamins-Minerals (CENTRUM SILVER PO) Take 1 tablet by mouth daily.     . ONGLYZA 5 MG TABS tablet Take 5 mg by mouth.    . Oxycodone HCl 10 MG TABS Take 1  tablet (10 mg total) by mouth 3 (three) times daily as needed (for pain). 90 tablet 0  . polyethylene glycol (MIRALAX / GLYCOLAX) packet Take 17 g by mouth daily.    . traMADol (ULTRAM) 50 MG tablet Take 1 tablet (50 mg total) by mouth every 6 (six) hours as needed for moderate pain. 90 tablet 0  . venlafaxine XR (EFFEXOR-XR) 75 MG 24 hr capsule Take 1 capsule (75 mg total) by mouth daily with breakfast. 30 capsule 11  . verapamil (VERELAN PM) 120 MG 24 hr capsule Take 1 capsule by mouth  daily 30 capsule 11   No current facility-administered medications for this visit.    PHYSICAL EXAMINATION: ECOG PERFORMANCE STATUS: 1 - Symptomatic but completely ambulatory  Filed Vitals:   12/27/15 0831  BP: 132/77  Pulse: 77  Temp: 98.8 F (37.1 C)  Resp: 18   Filed Weights   12/27/15 0831  Weight: 170 lb 6.4 oz (77.293 kg)    GENERAL:alert, no distress and comfortable SKIN: skin color, texture, turgor are normal, no rashes or significant lesions EYES: normal, Conjunctiva are pink and non-injected, sclera clear OROPHARYNX:no exudate, no erythema and lips, buccal mucosa, and tongue normal  NECK: supple, thyroid normal size, non-tender, without nodularity LYMPH:  no palpable lymphadenopathy in the cervical, axillary or inguinal LUNGS: clear to auscultation and percussion with normal breathing effort HEART: regular rate & rhythm and no murmurs and no lower extremity edema ABDOMEN:abdomen soft, non-tender and normal bowel sounds Musculoskeletal:no cyanosis of digits and no clubbing  NEURO: alert & oriented x 3 with fluent speech, no focal motor/sensory deficits  LABORATORY DATA:  I have reviewed the data as listed    Component Value Date/Time   NA 139 12/20/2015 0804   NA 142 03/16/2015 1012   K 3.8 12/20/2015 0804   K 3.6 03/16/2015 1012   CL 106 03/16/2015 1012   CO2 26 12/20/2015 0804   CO2 29 03/16/2015 1012   GLUCOSE 92 12/20/2015 0804   GLUCOSE 89 03/16/2015 1012   BUN 10.0  12/20/2015 0804   BUN 12 03/16/2015 1012   CREATININE 0.9 12/20/2015 0804   CREATININE 0.86 03/16/2015 1012   CALCIUM 9.4 12/20/2015 0804   CALCIUM 9.3 03/16/2015 1012   PROT 7.8 12/20/2015 0804   PROT 7.3 12/20/2015 0804   PROT 7.4 03/16/2015 1012   ALBUMIN 3.7 12/20/2015 0804   ALBUMIN 3.9 03/16/2015 1012   AST 23 12/20/2015 0804   AST 24 03/16/2015 1012   ALT 33 12/20/2015 0804   ALT 30 03/16/2015 1012   ALKPHOS 61 12/20/2015 0804   ALKPHOS 54 03/16/2015 1012   BILITOT 0.77 12/20/2015 0804   BILITOT 0.4 03/16/2015 1012   GFRNONAA 73* 03/16/2015 1012   GFRAA 85* 03/16/2015 1012    No results found for: SPEP, UPEP  Lab Results  Component Value Date   WBC 3.1* 12/20/2015   NEUTROABS 1.1* 12/20/2015   HGB 11.9 12/20/2015   HCT 38.7 12/20/2015   MCV 73.4* 12/20/2015   PLT 207 12/20/2015      Chemistry      Component Value Date/Time   NA 139 12/20/2015 0804  NA 142 03/16/2015 1012   K 3.8 12/20/2015 0804   K 3.6 03/16/2015 1012   CL 106 03/16/2015 1012   CO2 26 12/20/2015 0804   CO2 29 03/16/2015 1012   BUN 10.0 12/20/2015 0804   BUN 12 03/16/2015 1012   CREATININE 0.9 12/20/2015 0804   CREATININE 0.86 03/16/2015 1012      Component Value Date/Time   CALCIUM 9.4 12/20/2015 0804   CALCIUM 9.3 03/16/2015 1012   ALKPHOS 61 12/20/2015 0804   ALKPHOS 54 03/16/2015 1012   AST 23 12/20/2015 0804   AST 24 03/16/2015 1012   ALT 33 12/20/2015 0804   ALT 30 03/16/2015 1012   BILITOT 0.77 12/20/2015 0804   BILITOT 0.4 03/16/2015 1012      ASSESSMENT & PLAN:  Multiple myeloma in remission ([place]) She is doing well clinically while on treatment. Continue Revlimid at current dose adjustment. 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  Drug-induced pancytopenia ([place]) This is likely due to recent treatment. The patient denies recent history of fevers, cough, chills, diarrhea or dysuria. She is asymptomatic from the leukopenia. I will observe for now.  I will continue the chemotherapy at current dose without dosage adjustment.  If the leukopenia gets progressive worse in the future, I might have to delay her treatment or adjust the chemotherapy dose.      Neuropathy due to chemotherapeutic drug  She has history of neuropathy and recently complained of diffuse tingling and  Numbness sensation. I will check vitamin B-12 in the next visit for assessment  Diarrhea  She has history of chronic diarrhea, could be related to side effects of Revlimid. She continues conservative management.  Bone pain  She have chronic bone pain which started with a diagnosis of multiple myeloma.  she is taking tramadol or pain medicine as needed.  she does not like the sedating side effects from treatment from narcotic  Prescription.  she is currently doing water aerobics 3 times a week and is feeling much better. I continued to encourage her to participate with activity as tolerated.   Orders Placed This Encounter  Procedures  . Vitamin B12    Standing Status: Future     Number of Occurrences:      Standing Expiration Date: 01/30/2017  . Comprehensive metabolic panel    Standing Status: Future     Number of Occurrences:      Standing Expiration Date: 01/30/2017  . Kappa/lambda light chains    Standing Status: Future     Number of Occurrences:      Standing Expiration Date: 01/30/2017  . Multiple Myeloma Panel (SPEP&IFE w/QIG)    Standing Status: Future     Number of Occurrences:      Standing Expiration Date: 01/30/2017  . Immunofixation interpretive, urine    Standing Status: Future     Number of Occurrences:      Standing Expiration Date: 01/30/2017  . Protein, urine, 24 hour     Standing Status: Future     Number of Occurrences:      Standing Expiration Date: 01/30/2017  . Protein Electro, 24-Hour Urine    Standing Status: Future     Number of Occurrences:  Standing Expiration Date: 01/30/2017  . IFE, 24hr Urine (w Total Protein)    Standing Status: Future     Number of Occurrences:      Standing Expiration Date: 01/30/2017  . Beta 2 microglobulin, serum    Standing Status: Future     Number of Occurrences:      Standing Expiration Date: 01/30/2017   All questions were answered. The patient knows to call the clinic with any problems, questions or concerns. No barriers to learning was detected. I spent 20 minutes counseling the patient face to face. The total time spent in the appointment was 25 minutes and more than 50% was on counseling and review of test results     [facility], [place], MD 12/27/2015 10:00 AM

## 2015-12-27 NOTE — Assessment & Plan Note (Signed)
This is likely due to recent treatment. The patient denies recent history of fevers, cough, chills, diarrhea or dysuria. She is asymptomatic from the leukopenia. I will observe for now.  I will continue the chemotherapy at current dose without dosage adjustment.  If the leukopenia gets progressive worse in the future, I might have to delay her treatment or adjust the chemotherapy dose.   

## 2015-12-27 NOTE — Assessment & Plan Note (Signed)
She have chronic bone pain which started with a diagnosis of multiple myeloma.  she is taking tramadol or pain medicine as needed.  she does not like the sedating side effects from treatment from narcotic  Prescription.  she is currently doing water aerobics 3 times a week and is feeling much better. I continued to encourage her to participate with activity as tolerated.

## 2015-12-27 NOTE — Assessment & Plan Note (Signed)
She is doing well clinically while on treatment. Continue Revlimid at current dose adjustment. 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

## 2015-12-28 ENCOUNTER — Telehealth: Payer: Self-pay | Admitting: Neurology

## 2015-12-28 MED ORDER — LENALIDOMIDE 5 MG PO CAPS CTSU E1A11: ORAL_CAPSULE | ORAL | Status: DC

## 2015-12-28 NOTE — Telephone Encounter (Signed)
Called Carla Perry back. She advised [employer] sent last 2 office visit notes. She needs to know from Dr Jaynee Eagles is pt needs to be on any work restrictions. She is also going to send fax to office as well. Advised I will call her back by Monday after speaking to Dr Jaynee Eagles. She verbalized understanding.

## 2015-12-28 NOTE — Progress Notes (Signed)
12/27/2015 Patient in to clinic this morning for assessment prior to beginning treatment cycle 16, scheduled for 12/28/2015. MD visit scheduled one day in advance, due to MD out of clinic on 12/28/2015. Accordingly, lab tests were obtained on 12/20/2015, one week prior to MD visit, to allow time for disease assessment tests to be resulted, prior to MD visit. Based on lab results review, patient will complete collection of 24 hour urine, in order to confirm response.  Patient reports that she has experienced two days of mild diarrhea, with a maximum of 4 stools per 24 hour period. She has had moderate insomnia and moderate fatigue (low energy). Her neuropathy unchanged (moderate, grade 2), although she also reports a period of noticeable numbness, felt by Dr. Alvy Bimler to be unrelated to treatment. She notes moderate weakness in her legs, associated with her ongoing fatigue. She experienced mild vertigo (grade 1) one day this cycle.  Other complaints were recorded by the patient on her medication calendar, that were moderate in nature, including: - joint pain (knee) - back pain - bone pain - extremity pain (legs) - headaches (right and left side)  Patient returned completed cycle 15 Patient Medication Calendar, confirming correct dosing as prescribed. Patient given printed appointment calendar with Cycle 16 treatment days marked, to document doses for the upcoming cycle. [name] BSN, RN, CCRP 12/28/2015 8:25 AM

## 2015-12-28 NOTE — Telephone Encounter (Signed)
Collen from [place] called to verify pt restriction, Please call  [phone],

## 2015-12-29 NOTE — Telephone Encounter (Signed)
I have not put patient on any restrictions. I saw her last July and appears she is doing well from a headache perspective. Jinny Blossom is seeing her as well, will forward thie to [facility] for her comment. thanks

## 2015-12-31 LAB — UIFE/LIGHT CHAINS/TP QN, 24-HR UR
% BETA, Urine: 0 %
ALBUMIN, U: 100 %
ALPHA 1 URINE: 0 %
ALPHA-2-GLOBULIN, U: 0 %
Free Kappa Lt Chains,Ur: 11.7 mg/L (ref 1.35–24.19)
Free Lambda Lt Chains,Ur: 0.41 mg/L (ref 0.24–6.66)
GAMMA GLOBULIN URINE: 0 %
Kappa/Lambda Ratio,U: 28.54 — ABNORMAL HIGH (ref 2.04–10.37)
PROTEIN,TOTAL,URINE: 6.2 mg/dL
Prot,24hr calculated: 96.1 mg/24 hr (ref 30.0–150.0)

## 2015-12-31 NOTE — Telephone Encounter (Signed)
LVM for Carla Perry to call back. Please let her know we did not place pt on any restrictions if she calls back.

## 2015-12-31 NOTE — Telephone Encounter (Signed)
Colleen with [employer] called back this morning, she was told pt was not place on work restrictions. Thank you

## 2015-12-31 NOTE — Telephone Encounter (Signed)
I did not put the patient on any restrictions.

## 2016-01-02 ENCOUNTER — Telehealth: Payer: Self-pay | Admitting: *Deleted

## 2016-01-02 NOTE — Telephone Encounter (Signed)
Patient Carla Perry form on [employer].

## 2016-01-02 NOTE — Telephone Encounter (Signed)
Per Dr. Jaynee Eagles we cannot fill this form out patient will have to go to her PCP.

## 2016-01-18 ENCOUNTER — Other Ambulatory Visit: Payer: Self-pay | Admitting: *Deleted

## 2016-01-18 DIAGNOSIS — C9001 Multiple myeloma in remission: Secondary | ICD-10-CM

## 2016-01-18 MED ORDER — LENALIDOMIDE 5 MG PO CAPS CTSU E1A11: ORAL_CAPSULE | ORAL | Status: DC

## 2016-01-21 ENCOUNTER — Encounter: Payer: Self-pay | Admitting: Hematology and Oncology

## 2016-01-25 ENCOUNTER — Encounter: Payer: Self-pay | Admitting: *Deleted

## 2016-01-25 ENCOUNTER — Other Ambulatory Visit (HOSPITAL_BASED_OUTPATIENT_CLINIC_OR_DEPARTMENT_OTHER): Payer: BLUE CROSS/BLUE SHIELD

## 2016-01-25 DIAGNOSIS — G6289 Other specified polyneuropathies: Secondary | ICD-10-CM | POA: Diagnosis not present

## 2016-01-25 DIAGNOSIS — C9001 Multiple myeloma in remission: Secondary | ICD-10-CM

## 2016-01-25 DIAGNOSIS — Z006 Encounter for examination for normal comparison and control in clinical research program: Secondary | ICD-10-CM

## 2016-01-25 DIAGNOSIS — D61818 Other pancytopenia: Secondary | ICD-10-CM | POA: Diagnosis not present

## 2016-01-25 LAB — CBC WITH DIFFERENTIAL/PLATELET
BASO%: 0.2 % (ref 0.0–2.0)
Basophils Absolute: 0 10*3/uL (ref 0.0–0.1)
EOS%: 2 % (ref 0.0–7.0)
Eosinophils Absolute: 0.1 10*3/uL (ref 0.0–0.5)
HCT: 37.7 % (ref 34.8–46.6)
HGB: 11.6 g/dL (ref 11.6–15.9)
LYMPH%: 55.4 % — ABNORMAL HIGH (ref 14.0–49.7)
MCH: 22.6 pg — ABNORMAL LOW (ref 25.1–34.0)
MCHC: 30.9 g/dL — ABNORMAL LOW (ref 31.5–36.0)
MCV: 73.3 fL — ABNORMAL LOW (ref 79.5–101.0)
MONO#: 0.4 10*3/uL (ref 0.1–0.9)
MONO%: 13 % (ref 0.0–14.0)
NEUT#: 1 10*3/uL — ABNORMAL LOW (ref 1.5–6.5)
NEUT%: 29.4 % — ABNORMAL LOW (ref 38.4–76.8)
Platelets: 219 10*3/uL (ref 145–400)
RBC: 5.14 10*6/uL (ref 3.70–5.45)
RDW: 15.4 % — ABNORMAL HIGH (ref 11.2–14.5)
WBC: 3.3 10*3/uL — ABNORMAL LOW (ref 3.9–10.3)
lymph#: 1.8 10*3/uL (ref 0.9–3.3)

## 2016-01-25 LAB — COMPREHENSIVE METABOLIC PANEL
ALT: 30 U/L (ref 0–55)
AST: 21 U/L (ref 5–34)
Albumin: 3.6 g/dL (ref 3.5–5.0)
Alkaline Phosphatase: 62 U/L (ref 40–150)
Anion Gap: 7 mEq/L (ref 3–11)
BUN: 12.4 mg/dL (ref 7.0–26.0)
CO2: 26 mEq/L (ref 22–29)
Calcium: 9.5 mg/dL (ref 8.4–10.4)
Chloride: 106 mEq/L (ref 98–109)
Creatinine: 0.9 mg/dL (ref 0.6–1.1)
EGFR: 85 mL/min/{1.73_m2} — ABNORMAL LOW (ref >90)
Glucose: 97 mg/dl (ref 70–140)
Potassium: 3.9 mEq/L (ref 3.5–5.1)
Sodium: 139 mEq/L (ref 136–145)
Total Bilirubin: 0.67 mg/dL (ref 0.20–1.20)
Total Protein: 7.6 g/dL (ref 6.4–8.3)

## 2016-01-25 NOTE — Progress Notes (Signed)
01/25/2016 Patient in to clinic for lab tests prior to beginning maintenance treatment Cycle 17. Patient returned completed cycle 16 Patient Medication Calendar, confirming correct dosing as prescribed. Patient given printed appointment calendar with Cycle 17 treatment days marked, to document doses for the upcoming cycle. Based on CBC results obtained today, patient meets criteria for continued treatment. Patient notified of above by phone. She is aware that she should contact the clinic number 24/7 for any problems she experiences. [name] BSN, RN, CCRP 01/25/2016 1:47 PM  Adverse Event Log Cycle: 16 (12/27/15 - 01/25/16)  Event Grade Comments  fatigue Grade 3 Mid-cycle, limiting ADLs  insomnia  Grade 3 Multiple sleepless nights; required medication  Back pain  Grade 2   Bone pain  Grade 2   Joint pain  Grade 2 Knee, ankle  Headaches  Grade 2 Right & left-sided  Diarrhea Grade 2   Neuropathy, sensory Grade 2 Generalized tingling/numbness, unchanged from prior.  WBC decreased Grade 1   ANC decreased Grade 2    [name] BSN, RN, CCRP 03/03/2016 10:54 AM

## 2016-01-26 LAB — VITAMIN B12: Vitamin B12: 625 pg/mL (ref 211–946)

## 2016-01-26 LAB — BETA 2 MICROGLOBULIN, SERUM: Beta-2: 1.5 mg/L (ref 0.6–2.4)

## 2016-01-28 ENCOUNTER — Encounter: Payer: Self-pay | Admitting: Hematology and Oncology

## 2016-01-28 LAB — KAPPA/LAMBDA LIGHT CHAINS
Ig Kappa Free Light Chain: 24.55 mg/L — ABNORMAL HIGH (ref 3.30–19.40)
Ig Lambda Free Light Chain: 19.13 mg/L (ref 5.71–26.30)
Kappa/Lambda FluidC Ratio: 1.28 (ref 0.26–1.65)

## 2016-01-28 MED ORDER — LENALIDOMIDE 5 MG PO CAPS CTSU E1A11: ORAL_CAPSULE | ORAL | Status: DC

## 2016-01-29 LAB — MULTIPLE MYELOMA PANEL, SERUM
Albumin SerPl Elph-Mcnc: 3.5 g/dL (ref 2.9–4.4)
Albumin/Glob SerPl: 1 (ref 0.7–1.7)
Alpha 1: 0.2 g/dL (ref 0.0–0.4)
Alpha2 Glob SerPl Elph-Mcnc: 0.5 g/dL (ref 0.4–1.0)
B-Globulin SerPl Elph-Mcnc: 1.1 g/dL (ref 0.7–1.3)
Gamma Glob SerPl Elph-Mcnc: 1.7 g/dL (ref 0.4–1.8)
Globulin, Total: 3.6 g/dL (ref 2.2–3.9)
IgA, Qn, Serum: 131 mg/dL (ref 87–352)
IgG, Qn, Serum: 1702 mg/dL — ABNORMAL HIGH (ref 700–1600)
IgM, Qn, Serum: 62 mg/dL (ref 26–217)
Total Protein: 7.1 g/dL (ref 6.0–8.5)

## 2016-02-11 ENCOUNTER — Encounter: Payer: Self-pay | Admitting: Hematology and Oncology

## 2016-02-11 ENCOUNTER — Other Ambulatory Visit: Payer: Self-pay | Admitting: Hematology and Oncology

## 2016-02-11 DIAGNOSIS — G4700 Insomnia, unspecified: Secondary | ICD-10-CM | POA: Insufficient documentation

## 2016-02-11 HISTORY — DX: Insomnia, unspecified: G47.00

## 2016-02-11 MED ORDER — TRAZODONE HCL 50 MG PO TABS: 50.0000 mg | ORAL_TABLET | Freq: Every day | ORAL | Status: DC

## 2016-02-13 ENCOUNTER — Encounter: Payer: Self-pay | Admitting: Hematology and Oncology

## 2016-02-13 NOTE — Progress Notes (Signed)
Sent to medical records [employer] forms that were faxed 07/26/15 and 1.24.17

## 2016-02-14 ENCOUNTER — Other Ambulatory Visit: Payer: Self-pay | Admitting: *Deleted

## 2016-02-14 DIAGNOSIS — C9001 Multiple myeloma in remission: Secondary | ICD-10-CM

## 2016-02-14 MED ORDER — LENALIDOMIDE 5 MG PO CAPS CTSU E1A11: ORAL_CAPSULE | ORAL | Status: DC

## 2016-02-18 ENCOUNTER — Encounter: Payer: Self-pay | Admitting: Hematology and Oncology

## 2016-02-22 ENCOUNTER — Encounter: Payer: BLUE CROSS/BLUE SHIELD | Admitting: *Deleted

## 2016-02-22 ENCOUNTER — Other Ambulatory Visit (HOSPITAL_BASED_OUTPATIENT_CLINIC_OR_DEPARTMENT_OTHER): Payer: BLUE CROSS/BLUE SHIELD

## 2016-02-22 DIAGNOSIS — R946 Abnormal results of thyroid function studies: Secondary | ICD-10-CM

## 2016-02-22 DIAGNOSIS — C9001 Multiple myeloma in remission: Secondary | ICD-10-CM

## 2016-02-22 DIAGNOSIS — Z006 Encounter for examination for normal comparison and control in clinical research program: Secondary | ICD-10-CM

## 2016-02-22 LAB — CBC WITH DIFFERENTIAL/PLATELET
BASO%: 0.5 % (ref 0.0–2.0)
Basophils Absolute: 0 10*3/uL (ref 0.0–0.1)
EOS%: 2.9 % (ref 0.0–7.0)
Eosinophils Absolute: 0.1 10*3/uL (ref 0.0–0.5)
HCT: 37.7 % (ref 34.8–46.6)
HGB: 11.5 g/dL — ABNORMAL LOW (ref 11.6–15.9)
LYMPH%: 54.2 % — ABNORMAL HIGH (ref 14.0–49.7)
MCH: 22.4 pg — ABNORMAL LOW (ref 25.1–34.0)
MCHC: 30.6 g/dL — ABNORMAL LOW (ref 31.5–36.0)
MCV: 73.3 fL — ABNORMAL LOW (ref 79.5–101.0)
MONO#: 0.4 10*3/uL (ref 0.1–0.9)
MONO%: 12.9 % (ref 0.0–14.0)
NEUT#: 1 10*3/uL — ABNORMAL LOW (ref 1.5–6.5)
NEUT%: 29.5 % — ABNORMAL LOW (ref 38.4–76.8)
Platelets: 201 10*3/uL (ref 145–400)
RBC: 5.14 10*6/uL (ref 3.70–5.45)
RDW: 15.7 % — ABNORMAL HIGH (ref 11.2–14.5)
WBC: 3.3 10*3/uL — ABNORMAL LOW (ref 3.9–10.3)
lymph#: 1.8 10*3/uL (ref 0.9–3.3)

## 2016-02-22 LAB — TSH: TSH: 2.343 m(IU)/L (ref 0.308–3.960)

## 2016-02-22 NOTE — Progress Notes (Signed)
02/22/2016 Patient in to clinic for lab tests prior to beginning maintenance treatment Cycle 18. Patient returned completed cycle 17 Patient Medication Calendar, confirming correct dosing as prescribed. Patient given printed appointment calendar with Cycle 18 treatment days marked, to document doses for the upcoming cycle. Based on CBC results obtained today, patient meets criteria for continued treatment. Voice mail message left on patient's home answering machine noting that she may continue treatment as scheduled. [name] BSN, RN, [place] 02/22/2016 8:52 AM   Adverse Event Log Cycle: 17 (01/25/16 - 02/22/16) Attribution to be reported in cumulative log for maintenance cycles 16-18. Event Grade Comments  fatigue Grade 2 Mid-cycle, limiting ADLs  insomnia  Grade 3 Multiple sleepless nights; required medication [Correction to C-16 - medication started in C-17]  Back pain  Grade 2   Bone pain  Grade 2   Joint pain  Grade 2 Knee, hip  Headaches  Grade 2 Right & left-sided   anemia Grade 1   Neutrophil count decreased  Grade 2   WBC decreased Grade 1   Dyspnea  Grade 1 Associated with fatigue  Leg pain Grade 2   Vertigo Grade 1   Abdominal pain Grade 1 Intermittent, during 1st two weeks of cycle  [name] BSN, RN, CCRP 03/03/2016 11:35 AM

## 2016-02-25 ENCOUNTER — Encounter: Payer: Self-pay | Admitting: Hematology and Oncology

## 2016-02-25 MED ORDER — LENALIDOMIDE 5 MG PO CAPS CTSU E1A11: ORAL_CAPSULE | ORAL | Status: DC

## 2016-03-07 ENCOUNTER — Other Ambulatory Visit: Payer: Self-pay | Admitting: *Deleted

## 2016-03-07 ENCOUNTER — Encounter: Payer: Self-pay | Admitting: *Deleted

## 2016-03-07 ENCOUNTER — Telehealth: Payer: Self-pay | Admitting: Hematology and Oncology

## 2016-03-07 ENCOUNTER — Telehealth: Payer: Self-pay

## 2016-03-07 DIAGNOSIS — C9001 Multiple myeloma in remission: Secondary | ICD-10-CM

## 2016-03-07 NOTE — Telephone Encounter (Signed)
The patient has planned colonoscopy and biopsy on 03/27/2016. I have spoken with her gastroenterologist and touched base with the research coordinator. I recommend she hold her treatment after the treatment break from 03/20/2016 to 03/27/2016 until after her colonoscopy and then she can resume her Revlimid. With her chronic low white blood cell counts, I'm concerned about increased risks of infection. I will see her back in the future for further review.

## 2016-03-07 NOTE — Telephone Encounter (Signed)
Spoke with patient per cindy s and she is aware of her new appointments.

## 2016-03-14 ENCOUNTER — Other Ambulatory Visit: Payer: BLUE CROSS/BLUE SHIELD

## 2016-03-14 ENCOUNTER — Other Ambulatory Visit: Payer: Self-pay | Admitting: *Deleted

## 2016-03-14 DIAGNOSIS — C9001 Multiple myeloma in remission: Secondary | ICD-10-CM

## 2016-03-14 MED ORDER — LENALIDOMIDE 5 MG PO CAPS CTSU E1A11: ORAL_CAPSULE | ORAL | Status: DC

## 2016-03-18 ENCOUNTER — Telehealth: Payer: Self-pay | Admitting: *Deleted

## 2016-03-18 ENCOUNTER — Encounter: Payer: Self-pay | Admitting: Neurology

## 2016-03-18 ENCOUNTER — Ambulatory Visit (INDEPENDENT_AMBULATORY_CARE_PROVIDER_SITE_OTHER): Payer: BLUE CROSS/BLUE SHIELD | Admitting: Neurology

## 2016-03-18 VITALS — BP 125/72 | HR 53 | Ht 67.0 in | Wt 168.4 lb

## 2016-03-18 DIAGNOSIS — R208 Other disturbances of skin sensation: Secondary | ICD-10-CM

## 2016-03-18 DIAGNOSIS — H5713 Ocular pain, bilateral: Secondary | ICD-10-CM

## 2016-03-18 DIAGNOSIS — H539 Unspecified visual disturbance: Secondary | ICD-10-CM

## 2016-03-18 DIAGNOSIS — C7951 Secondary malignant neoplasm of bone: Secondary | ICD-10-CM

## 2016-03-18 DIAGNOSIS — R200 Anesthesia of skin: Secondary | ICD-10-CM

## 2016-03-18 DIAGNOSIS — G9520 Unspecified cord compression: Secondary | ICD-10-CM | POA: Diagnosis not present

## 2016-03-18 DIAGNOSIS — G9529 Other cord compression: Secondary | ICD-10-CM

## 2016-03-18 DIAGNOSIS — G4489 Other headache syndrome: Secondary | ICD-10-CM

## 2016-03-18 DIAGNOSIS — R202 Paresthesia of skin: Secondary | ICD-10-CM

## 2016-03-18 MED ORDER — VENLAFAXINE HCL ER 150 MG PO CP24: 150.0000 mg | ORAL_CAPSULE | Freq: Every day | ORAL | Status: DC

## 2016-03-18 NOTE — Telephone Encounter (Signed)
Responded to voice mail message left by patient on Monday afternoon 03/17/2016. Patient inquired about plans for next cycle of treatment, to be delayed due to upcoming colonoscopy, reported by patient to be scheduled for 03/24/2016. Explained to patient that she has been scheduled for lab appointment on Thursday 4/20 at 0800 followed by an appointment with Dr. Alvy Bimler on Wednesday 03/26/2016 at 0930. Decision to be made at that time about restarting Revlimid on 4/26 or 4/27. Left my call back number [phone] if patient needs further clarification. [name] BSN, RN, [place] 03/18/2016 9:19 AM

## 2016-03-18 NOTE — Progress Notes (Signed)
[name]    Provider:  Dr Jaynee Perry Referring Provider: Wenda Low, MD Primary Care Physician:  Carla Low, MD   CC: Chronic migraines and episodes of body paresthesias  Interval update 03/18/2016: This is a lovely 60 year old female with a very complicated past medical history including hypertension, thyroid disorder, migraine, multiple myeloma s/p chemo currently participating in a research trial, neuropathy, insomnia, bone pain, back pain, joint pain, anemia, fatigue, leukopenia due to antineoplastic chemotherapy, right leg swelling, DVT, diabetes. I have seen patient in the past for chronic migraines but today she is here for a new problem, paresthesias that started February. Happens when waking up in the morning. Never happens otherwise. She feels an electrical current through her body with persistent tingling.  Starts in the thoracic or in the lumbar spine. Feels like electricity. Starts in the spine. She feels tingling all over, in the face, arms, legs from head to toe.  Will last several hours. A lot of times it is associated with a migraine. Nothing makes it better. Stretching aggravates the symptoms. She has pain in the spine associated with the paresthesias. Last night it started with numbness at 2am. She couldn't get back to sleep. She developed a headache. He still has residual headache and Her left eye still fells sore and head feels sore. Also a residual burning feeling. Arms feel like they are burning. Episodes becoming more frequent. 6 episodes in Feb, 8 in march, 13 so far in April. Becoming more frequent and lasting longer. She experiences facial numbness with the episodes, burning and tingling in the spine that spreads to her whole body. The symptoms are usually associated with headache or migraine. Moving around helps with the symptoms. No weakness. No vision changes but does describe eye pain. The headaches are pressure and pain.she still gets the vertigo  with the migraines. Headache left side of the head with the vertigo, no light or sound sensitivity. 5-10 minutes after the paresthesias she has the migraines/headaches. No inciting events. No trauma. She has Perry back pain, situated on the right side, radiates down the legs to the back of the legs, walking is not as good. Right leg goes numb. Paresthesias are more on the left.   Recent labs include normal B12. Normal TSH.  Interval update 06/19/2015: This is a former patient of Carla. Janann Perry who is seen in our office by our nurse practitioner Carla Perry. Patient is establishing care with me today. She is a 60 year old female with a history of chronic migraines. She was last seen in April. Today she returns and she is doing very well. She is on verapamil 120 mg extended release and venlafaxine XR 75 mg. She is doing very well. She has had maybe 3 migraines within the last few months. And they have been on the left side of the head with vertigo twice. When she has the vertigo, she has to lay in bed and sleep it off. But this is a significant improvement and she is very happy with this maintenance. She endorses nausea, photophobia and phonophobia. She takes tramadol for acute management. Discussed in detail that if she is doing well, we will keep her on her current migraine management. She can continue to follow up with Carla Perry as needed.   HISTORY OF PRESENT ILLNESS: Ms. Carla Perry is a 60 year old female with a history of chronic migraines. She returns today for follow-up. The patient is currently taking Effexor 37.5 mg daily as well as verapamil 120 mg daily. She reports that  at the last visit Carla. Janann Perry decreased her Effexor. She states since then she has had 13 headaches in March and she has had 4 headaches in April so far. She states her headaches are normally located on the left side and associated with left-sided numbness. She confirms nausea, photophobia and phonophobia. She also states that when the  headaches become severe its as if she is on the verge of having vertigo. She states that she can take tramadol and her headache will normally resolve in 1-2 hours. She states that if she gets a headache on the right side it usually resolves with Advil migraine. The patient does have multiple myeloma and takes a chemotherapy pill. She denies any new neurological symptoms. Denies any new medical issues. She returns today for evaluation.  HISTORY 08/02/14 Carla Perry): Carla Perry is a 60 y.o. female, former Carla Carla Perry patient, here as a follow up appointment for chronic migraines with last visit being on 08/09/2013 at which time she received Botox injections. She notes good benefit from these injections. Did not follow up for repeat injections. Since last visit she was unfortunately diagnosed with multiple myeloma and has been undergoing treatment with oncology. Reports so far things are going well. She has one more cycle and then she is finished. She reports she has held off on the Botox injections per recommendation of her oncologist, instructed she can restart once she is done with chemotherapy.   Continues to have headaches. Typically will have 1 to 3 headaches per month. Takes tramadol and falls asleep, when she wakes up the headache is gone. Describes it is a predominantly right sided temporal squeezing pounding type pain. + Nausea, no emesis, + photo and phonophobia. Headaches last 6 hours to all day. Notes she gets weak on the left side with the headaches and has baseline numbness on the left side of her body. Reports she has not had a MRI brain in years. She notes Carla Carla Perry started her on Effexor and Verapamil. She wishes to get off both of these medications as she feels they are not helping.   Recently saw eye doctor for right eye pain, told she may have glaucoma in her right eye, they will continue to monitor. They also told her that she has dry eyes, was given lubricating eye drops. The eye drops have  improved the eye pain.   Initial visit 05/2013: Former Carla Carla Perry patient. She reports being overall stable. Continues to have daily migrainous headache which have been ongoing for greater then 4 years. Predominantly R sided temporal, described as squeezing and pounding. These headaches are her baseline headache and are occuring on a near continuous pattern every day of the month. Is also having severe "flare ups" where the pain worsens and becomes a more sharp/intense pain. These episodes are occuring around 8 to 10 times per month. With these headaches she continues to have episodes of transient unilateral (predominantly R sided) weakness that self resolves. Also notes occasional unilateral sensory changes (again R sided predominant). Also has episodes of transient vertigo associated with the headache, has occurred 2 times in the past month, requiring her to miss work. She has had multiple MRIs during these events to rule out stroke and no ischemic changes have been found. Has been unable to tolerate most abortive agents and due to presence of hemiplegic/basilar symptoms she is not a candidate for triptans or DHE. In regards to prophylactic agents she has tried for >3-4 months the following agents and failed/did not  tolerate: Topamax (AED), Propranolol (BB), verapamil (CCB) and Effexor (SNRI). Remains on verapmil and Effexor per Carla Carla Perry but no benefit noted. Denies any family history of hemiplegic migraine.   Continues to have subjective memory difficulties, notes a "fogginess". Has had formal neuro-psych testing which showed an improvement in her overall cognitive function. Continues to have episodes of auditory hallucinations. Has not seen a psychiatrist for this.    Review of Systems: Patient complains of symptoms per HPI as well as the following symptoms: Joint pain, back pain, numbness. Pertinent negatives per HPI. All others negative.   Social History   Social History  . Marital Status: Married      Spouse Name: Arnell Sieving  . Number of Children: 2  . Years of Education: 14   Occupational History  .  Lab [employer]   Social History Main Topics  . Smoking status: Never Smoker   . Smokeless tobacco: Never Used  . Alcohol Use: No  . Drug Use: No  . Sexual Activity: Not on file   Other Topics Concern  . Not on file   Social History Narrative   Patient lives at home with her husband Special educational needs teacher). Patient has two years college.   Right handed.   Caffeine- None    Family History  Problem Relation Age of Onset  . Throat cancer Father   . Stroke Father   . Cancer Brother     prostate    Past Medical History  Diagnosis Date  . HBP (high blood pressure)   . Thyroid disorder   . Migraine   . Memory loss   . Anemia   . MGUS (monoclonal gammopathy of unknown significance)   . MGUS (monoclonal gammopathy of unknown significance) 10/06/2013  . Anemia, unspecified 10/06/2013  . Bone pain 10/21/2013  . Multiple myeloma, without mention of having achieved remission 11/16/2013  . Seizure ([place]) 1960    single seizure episode at age 61  . Leukopenia due to antineoplastic chemotherapy 12/27/2013  . Right leg swelling 02/07/2014  . DVT (deep venous thrombosis) ([place]) 02/07/2014  . Diverticulitis 07/18/2014  . Abnormal thyroid function test 08/30/2014  . Diabetes mellitus without complication ([place]) 47/6546    Steroid induced diabetes. has not picked oral med up from pharmacy as of 09-14-14  . Vitamin D deficiency 10/24/2014  . Bronchitis 01/31/2015  . Peripheral neuropathy ([place]) 12/27/2015  . Pancytopenia ([place]) 12/27/2015  . Insomnia 02/11/2016    Past Surgical History  Procedure Laterality Date  . Hemorrhoid surgery    . Portacath placement Right jan 2015  . Port-a-cath removal  10-2014    Current Outpatient Prescriptions  Medication Sig Dispense Refill  . aspirin 325 MG tablet Take 325 mg by mouth daily.    . carboxymethylcellulose (REFRESH PLUS) 0.5 % SOLN Place 1 drop into both eyes daily  as needed (for dry eyes).     . Cholecalciferol (VITAMIN D3) 2000 UNITS TABS Take 2,000 Units by mouth daily.    . hydrochlorothiazide (HYDRODIURIL) 25 MG tablet Take 25 mg by mouth daily with breakfast.     . HYDROmorphone (DILAUDID) 2 MG tablet Take 1 tablet (2 mg total) by mouth every 6 (six) hours as needed for severe pain. 60 tablet 0  . lenalidomide (REVLIMID) 5 MG capsule Take 1 capsule daily on days 1-21. Repeat every 28 days. 21 capsule 0  . levothyroxine (SYNTHROID, LEVOTHROID) 75 MCG tablet Take 75 mcg by mouth daily before breakfast.    . Multiple Vitamins-Minerals (CENTRUM SILVER PO)  Take 1 tablet by mouth daily.     . ONGLYZA 5 MG TABS tablet Take 5 mg by mouth.    . Oxycodone HCl 10 MG TABS Take 1 tablet (10 mg total) by mouth 3 (three) times daily as needed (for pain). 90 tablet 0  . polyethylene glycol (MIRALAX / GLYCOLAX) packet Take 17 g by mouth daily.    . traMADol (ULTRAM) 50 MG tablet Take 1 tablet (50 mg total) by mouth every 6 (six) hours as needed for moderate pain. 90 tablet 0  . traZODone (DESYREL) 50 MG tablet Take 1 tablet (50 mg total) by mouth at bedtime. 30 tablet 6  . venlafaxine XR (EFFEXOR-XR) 75 MG 24 hr capsule Take 1 capsule (75 mg total) by mouth daily with breakfast. 30 capsule 11  . verapamil (VERELAN PM) 120 MG 24 hr capsule Take 1 capsule by mouth  daily 30 capsule 11   No current facility-administered medications for this visit.    Allergies as of 03/18/2016  . (No Known Allergies)    Vitals: BP 125/72 mmHg  Pulse 53  Ht 5' 7"  (1.702 m)  Wt 168 lb 6.4 oz (76.386 kg)  BMI 26.37 kg/m2 Last Weight:  Wt Readings from Last 1 Encounters:  03/18/16 168 lb 6.4 oz (76.386 kg)   Last Height:   Ht Readings from Last 1 Encounters:  03/18/16 5' 7"  (1.702 m)    Physical exam: Exam: Gen: NAD, conversant, well nourised, well groomed  CV: RRR, no MRG. No Carotid Bruits. No peripheral edema, warm, nontender Eyes: Conjunctivae  clear without exudates or hemorrhage  Neuro: Detailed Neurologic Exam  Speech:  Speech is normal; fluent and spontaneous with normal comprehension.  Cognition:  The patient is oriented to person, place, and time;   recent and remote memory intact;   language fluent;   normal attention, concentration,   fund of knowledge Cranial Nerves:  The pupils are equal, round, and reactive to light. The fundi are flat. Visual fields are full to finger confrontation. Extraocular movements are intact. Trigeminal sensation is intact and the muscles of mastication are normal. The face is symmetric. The palate elevates in the midline. Hearing intact. Voice is normal. Shoulder shrug is normal. The tongue has normal motion without fasciculations.   Motor Observation:  No asymmetry, no atrophy, and no involuntary movements noted. Tone:  Normal muscle tone.   Gait: mildly antalgic. Can heel and toe walk, slight imbalance with tandem. Mild right biceps femoris weakness.   Posture:  Posture is normal. normal erect   Strength:  Strength is V/V in the upper and lower limbs.    Sensation: SENSORY LEVEL AT T12       Assessment/Plan: This is a lovely 60 year old female with a very complicated past medical history including hypertension, thyroid disorder, migraine, multiple myeloma s/p chemo currently participating in a research trial, neuropathy, insomnia, bone pain, back pain, joint pain, anemia, fatigue, leukopenia due to antineoplastic chemotherapy, right leg swelling, DVT, diabetes. I have seen patient in the past for chronic migraines but today she is here for a new problem, paresthesias that started February. The episodes wake her up in the middle of the night, are associated with numbness and paresthesias in the face and extremities, often followed by headache or migrainous symptoms. I'm not sure why she is having the symptoms, they're increasing in frequency and  severity. Could be migrainous phenomena, migraine aura as they started about 10 minutes before headache. She endorses a sensory level at T12  so will order imaging of the brain, cervical spine and thoracic spine to ensure no lesions especially given her history of multiple myeloma and chemotherapy. In the meantime we can increase venlafaxine, she has done very well on this for her migraines in the past.  To prevent or relieve headaches, try the following: Cool Compress. Lie down and place a cool compress on your head.  Avoid headache triggers. If certain foods or odors seem to have triggered your migraines in the past, avoid them. A headache diary might help you identify triggers.  Include physical activity in your daily routine. Try a daily walk or other moderate aerobic exercise.  Manage stress. Find healthy ways to cope with the stressors, such as delegating tasks on your to-do list.  Practice relaxation techniques. Try deep breathing, yoga, massage and visualization.  Eat regularly. Eating regularly scheduled meals and maintaining a healthy diet might help prevent headaches. Also, drink plenty of fluids.  Follow a regular sleep schedule. Sleep deprivation might contribute to headaches Consider biofeedback. With this mind-body technique, you learn to control certain bodily functions - such as muscle tension, heart rate and blood pressure - to prevent headaches or reduce headache pain.    Proceed to emergency room if you experience new or worsening symptoms or symptoms do not resolve, if you have new neurologic symptoms or if headache is severe, or for any concerning symptom.    Sarina Ill, MD [facility] [address] [place], [place] [address]  Phone [phone] Fax [phone]  A total of 45 minutes was spent face-to-face with this patient. Over half this time was spent on counseling patient on the paresthesias, migraines diagnosis and different  diagnostic and therapeutic options available.

## 2016-03-18 NOTE — Patient Instructions (Signed)
Remember to drink plenty of fluid, eat healthy meals and do not skip any meals. Try to eat protein with a every meal and eat a healthy snack such as fruit or nuts in between meals. Try to keep a regular sleep-wake schedule and try to exercise daily, particularly in the form of walking, 20-30 minutes a day, if you can.   As far as your medications are concerned, I would like to suggest; Increase Venlafaxine to 150 daily  As far as diagnostic testing: MRI brain, cervical spine and thoracic spine  I would like to see you back in 3 months, sooner if we need to. Please call us with any interim questions, concerns, problems, updates or refill requests.   Our phone number is [phone]. We also have an after hours call service for urgent matters and there is a physician on-call for urgent questions. For any emergencies you know to call 911 or go to the nearest emergency room

## 2016-03-19 DIAGNOSIS — R519 Headache, unspecified: Secondary | ICD-10-CM | POA: Insufficient documentation

## 2016-03-19 DIAGNOSIS — R202 Paresthesia of skin: Secondary | ICD-10-CM | POA: Insufficient documentation

## 2016-03-19 DIAGNOSIS — R51 Headache: Secondary | ICD-10-CM

## 2016-03-20 ENCOUNTER — Telehealth: Payer: Self-pay | Admitting: *Deleted

## 2016-03-20 ENCOUNTER — Other Ambulatory Visit: Payer: Self-pay | Admitting: *Deleted

## 2016-03-20 ENCOUNTER — Other Ambulatory Visit (HOSPITAL_BASED_OUTPATIENT_CLINIC_OR_DEPARTMENT_OTHER): Payer: BLUE CROSS/BLUE SHIELD

## 2016-03-20 ENCOUNTER — Telehealth: Payer: Self-pay | Admitting: Hematology and Oncology

## 2016-03-20 DIAGNOSIS — C9001 Multiple myeloma in remission: Secondary | ICD-10-CM

## 2016-03-20 LAB — CBC WITH DIFFERENTIAL/PLATELET
BASO%: 0.3 % (ref 0.0–2.0)
Basophils Absolute: 0 10*3/uL (ref 0.0–0.1)
EOS%: 2.5 % (ref 0.0–7.0)
Eosinophils Absolute: 0.1 10*3/uL (ref 0.0–0.5)
HCT: 37.1 % (ref 34.8–46.6)
HGB: 11.6 g/dL (ref 11.6–15.9)
LYMPH%: 56.8 % — ABNORMAL HIGH (ref 14.0–49.7)
MCH: 23.2 pg — ABNORMAL LOW (ref 25.1–34.0)
MCHC: 31.3 g/dL — ABNORMAL LOW (ref 31.5–36.0)
MCV: 74.1 fL — ABNORMAL LOW (ref 79.5–101.0)
MONO#: 0.4 10*3/uL (ref 0.1–0.9)
MONO%: 11 % (ref 0.0–14.0)
NEUT#: 0.9 10*3/uL — ABNORMAL LOW (ref 1.5–6.5)
NEUT%: 29.4 % — ABNORMAL LOW (ref 38.4–76.8)
Platelets: 191 10*3/uL (ref 145–400)
RBC: 5.01 10*6/uL (ref 3.70–5.45)
RDW: 15.7 % — ABNORMAL HIGH (ref 11.2–14.5)
WBC: 3.2 10*3/uL — ABNORMAL LOW (ref 3.9–10.3)
lymph#: 1.8 10*3/uL (ref 0.9–3.3)

## 2016-03-20 LAB — COMPREHENSIVE METABOLIC PANEL
ALT: 32 U/L (ref 0–55)
AST: 24 U/L (ref 5–34)
Albumin: 3.4 g/dL — ABNORMAL LOW (ref 3.5–5.0)
Alkaline Phosphatase: 59 U/L (ref 40–150)
Anion Gap: 8 mEq/L (ref 3–11)
BUN: 11.9 mg/dL (ref 7.0–26.0)
CO2: 27 mEq/L (ref 22–29)
Calcium: 9.4 mg/dL (ref 8.4–10.4)
Chloride: 106 mEq/L (ref 98–109)
Creatinine: 0.9 mg/dL (ref 0.6–1.1)
EGFR: 78 mL/min/{1.73_m2} — ABNORMAL LOW (ref >90)
Glucose: 85 mg/dl (ref 70–140)
Potassium: 3.9 mEq/L (ref 3.5–5.1)
Sodium: 141 mEq/L (ref 136–145)
Total Bilirubin: 0.68 mg/dL (ref 0.20–1.20)
Total Protein: 7.5 g/dL (ref 6.4–8.3)

## 2016-03-20 LAB — LACTATE DEHYDROGENASE: LDH: 184 U/L (ref 125–245)

## 2016-03-20 MED ORDER — VENLAFAXINE HCL ER 150 MG PO CP24: 150.0000 mg | ORAL_CAPSULE | Freq: Every day | ORAL | Status: DC

## 2016-03-20 NOTE — Telephone Encounter (Signed)
Sent rx effexor XR electronically to requested pharmacy per pt below.

## 2016-03-20 NOTE — Telephone Encounter (Signed)
Patient called back to advise, Pharmacy is [facility].

## 2016-03-20 NOTE — Telephone Encounter (Signed)
lvm to inform patient of added lab appt on 4/26 at 9am per 4/20 pof

## 2016-03-20 NOTE — Telephone Encounter (Signed)
Called pt to verify which pharmacy she is using d/t multiple pharmacies on file.  [facility]. Gave [facility] phone number. Need to send rx effexor 150mg  to that pharmacy. Got sent to [facility] by mistake.    Called [facility] RE: rx effecxor 150mg  that was sent. We received fax in office not specifying what they needed. Spoke to [place]. She stated [facility] only does specialty medications. Effexor is not included in those. This medication will have to go to pt local pharmacy.

## 2016-03-21 ENCOUNTER — Ambulatory Visit: Payer: BLUE CROSS/BLUE SHIELD | Admitting: Hematology and Oncology

## 2016-03-21 LAB — KAPPA/LAMBDA LIGHT CHAINS
Ig Kappa Free Light Chain: 29.11 mg/L — ABNORMAL HIGH (ref 3.30–19.40)
Ig Lambda Free Light Chain: 20.17 mg/L (ref 5.71–26.30)
Kappa/Lambda FluidC Ratio: 1.44 (ref 0.26–1.65)

## 2016-03-24 ENCOUNTER — Other Ambulatory Visit: Payer: Self-pay | Admitting: Gastroenterology

## 2016-03-24 LAB — MULTIPLE MYELOMA PANEL, SERUM
Albumin SerPl Elph-Mcnc: 3.9 g/dL (ref 2.9–4.4)
Albumin/Glob SerPl: 1.2 (ref 0.7–1.7)
Alpha 1: 0.1 g/dL (ref 0.0–0.4)
Alpha2 Glob SerPl Elph-Mcnc: 0.5 g/dL (ref 0.4–1.0)
B-Globulin SerPl Elph-Mcnc: 1.1 g/dL (ref 0.7–1.3)
Gamma Glob SerPl Elph-Mcnc: 1.7 g/dL (ref 0.4–1.8)
Globulin, Total: 3.4 g/dL (ref 2.2–3.9)
IgA, Qn, Serum: 146 mg/dL (ref 87–352)
IgG, Qn, Serum: 1825 mg/dL — ABNORMAL HIGH (ref 700–1600)
IgM, Qn, Serum: 59 mg/dL (ref 26–217)
Total Protein: 7.3 g/dL (ref 6.0–8.5)

## 2016-03-26 ENCOUNTER — Telehealth: Payer: Self-pay | Admitting: Hematology and Oncology

## 2016-03-26 ENCOUNTER — Ambulatory Visit (HOSPITAL_BASED_OUTPATIENT_CLINIC_OR_DEPARTMENT_OTHER): Payer: BLUE CROSS/BLUE SHIELD | Admitting: Hematology and Oncology

## 2016-03-26 ENCOUNTER — Other Ambulatory Visit: Payer: BLUE CROSS/BLUE SHIELD

## 2016-03-26 ENCOUNTER — Encounter: Payer: BLUE CROSS/BLUE SHIELD | Admitting: *Deleted

## 2016-03-26 ENCOUNTER — Encounter: Payer: Self-pay | Admitting: Hematology and Oncology

## 2016-03-26 ENCOUNTER — Other Ambulatory Visit (HOSPITAL_BASED_OUTPATIENT_CLINIC_OR_DEPARTMENT_OTHER): Payer: BLUE CROSS/BLUE SHIELD

## 2016-03-26 VITALS — BP 138/73 | HR 59 | Temp 98.2°F | Resp 18 | Ht 67.0 in | Wt 168.7 lb

## 2016-03-26 DIAGNOSIS — Z006 Encounter for examination for normal comparison and control in clinical research program: Secondary | ICD-10-CM

## 2016-03-26 DIAGNOSIS — M899 Disorder of bone, unspecified: Secondary | ICD-10-CM | POA: Diagnosis not present

## 2016-03-26 DIAGNOSIS — C9001 Multiple myeloma in remission: Secondary | ICD-10-CM | POA: Diagnosis not present

## 2016-03-26 DIAGNOSIS — M898X9 Other specified disorders of bone, unspecified site: Secondary | ICD-10-CM

## 2016-03-26 DIAGNOSIS — D61811 Other drug-induced pancytopenia: Secondary | ICD-10-CM | POA: Diagnosis not present

## 2016-03-26 DIAGNOSIS — R197 Diarrhea, unspecified: Secondary | ICD-10-CM

## 2016-03-26 DIAGNOSIS — G893 Neoplasm related pain (acute) (chronic): Secondary | ICD-10-CM

## 2016-03-26 LAB — CBC WITH DIFFERENTIAL/PLATELET
BASO%: 0.3 % (ref 0.0–2.0)
Basophils Absolute: 0 10*3/uL (ref 0.0–0.1)
EOS%: 1.6 % (ref 0.0–7.0)
Eosinophils Absolute: 0.1 10*3/uL (ref 0.0–0.5)
HCT: 35.6 % (ref 34.8–46.6)
HGB: 11.3 g/dL — ABNORMAL LOW (ref 11.6–15.9)
LYMPH%: 51.4 % — ABNORMAL HIGH (ref 14.0–49.7)
MCH: 23.3 pg — ABNORMAL LOW (ref 25.1–34.0)
MCHC: 31.7 g/dL (ref 31.5–36.0)
MCV: 73.6 fL — ABNORMAL LOW (ref 79.5–101.0)
MONO#: 0.4 10*3/uL (ref 0.1–0.9)
MONO%: 11.5 % (ref 0.0–14.0)
NEUT#: 1.3 10*3/uL — ABNORMAL LOW (ref 1.5–6.5)
NEUT%: 35.2 % — ABNORMAL LOW (ref 38.4–76.8)
Platelets: 225 10*3/uL (ref 145–400)
RBC: 4.84 10*6/uL (ref 3.70–5.45)
RDW: 16.1 % — ABNORMAL HIGH (ref 11.2–14.5)
WBC: 3.6 10*3/uL — ABNORMAL LOW (ref 3.9–10.3)
lymph#: 1.9 10*3/uL (ref 0.9–3.3)

## 2016-03-26 MED ORDER — OXYCODONE HCL 10 MG PO TABS: 10.0000 mg | ORAL_TABLET | Freq: Three times a day (TID) | ORAL | Status: DC | PRN

## 2016-03-26 NOTE — Assessment & Plan Note (Signed)
She is doing well clinically while on treatment. Continue Revlimid at current dose adjustment. 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

## 2016-03-26 NOTE — Telephone Encounter (Signed)
Gave pt appt & avs °

## 2016-03-26 NOTE — Assessment & Plan Note (Signed)
She have chronic bone pain which started with a diagnosis of multiple myeloma. She is taking tramadol or pain medicine as needed. She does not like the sedating side effects from treatment from narcotic prescription. She is currently doing water aerobics 3 times a week and is feeling much better. I continued to encourage her to participate with activity as tolerated. I refill her prescription oxycodone today. 

## 2016-03-26 NOTE — Assessment & Plan Note (Signed)
She has intermittent diarrhea that could be related to Revlimid. She had recent colonoscopy, results are good but biopsy of colon polyps are pending. I would defer to her GI doctor for further management

## 2016-03-26 NOTE — Progress Notes (Signed)
03/26/2016 Patient in to clinic for assessment prior to beginning maintenance treatment Cycle 19. Patient returned completed cycle 18 Patient Medication Calendar, confirming correct dosing as prescribed.   Due to planned colonoscopy on March 24, 2016, Cycle 19 treatment start was delayed from 03/21/16 to 03/27/16, as allowed by protocol section 5.4. End of Cycle 18 lab tests were collected on 03/20/2016, one week in advance of delayed cycle start, to allow time for disease assessment tests to be resulted prior to the MD visit.   Due to [place] of 900 on Cycle 18, Day 28, CBC was repeated today. Based on CBC results obtained today, patient meets criteria for continued treatment. Patient given printed appointment calendar with Cycle 19 treatment days marked, to document doses for the upcoming cycle, which will begin tomorrow morning. Patient voiced understanding. Adverse events are outlined below. Patient and MD are aware of bone marrow biopsy and metastatic bone survey, to be performed at the end of cycle 24, approximately October 2017. Treatment is expected to continue (indefinite Revlimid) until evidence of disease progression.  [name] BSN, RN, [place] 03/26/2016 4:14 PM  Adverse Event Log  Study/Protocol: CTSU ECOG [id number] Cycles: Maintenance 16-18 (12/27/15 - 03/26/16)  Event Grade Attribution to lenalidomide Comments  anemia Grade 1 Probable Improved, then worsened  Fatigue  Grade 2-3  Probable Also reported as low energy.  Sensory neuropathy  Grade 2 Unlikely Unchanged from previous  Insomnia  Grade 1-3  Unlikely Improved after starting sleeping pill.  Neutrophil count decreased Grade 2-3  Definite   Dyspnea  Grade 1 Unlikely Associated with fatigue.  Diarrhea Grade 2 Probable Maximum 6 stools per day; also reported as "stomach problems"   Non-reportable AEs (grade): - Moderate pain (2) o Bone pain o Joint pain (hip, knee, ankle) o Leg pain o Back pain - Abdominal pain - mild in nature  (1) - Headaches - moderate (2) - Leukopenia (1) - Hypoalbuminemia (1) - Decreased eGFR (1) - Weight loss (1) - Vertigo (1) - Paresthesias - described as "nerve problems", tingling and burning spine pain (2)  [name] BSN, RN, [place] 04/02/2016 2:18 PM

## 2016-03-26 NOTE — Assessment & Plan Note (Signed)
This is likely due to recent treatment. The patient denies recent history of fevers, cough, chills, diarrhea or dysuria. She is asymptomatic from the leukopenia. I will observe for now.  I will continue the chemotherapy at current dose without dosage adjustment.  If the leukopenia gets progressive worse in the future, I might have to delay her treatment or adjust the chemotherapy dose per protocol  

## 2016-03-26 NOTE — Progress Notes (Signed)
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   05/05/2014 Imaging Imaging study of the neck showed no explanation that could cause right neck pain. She is noted to have incidental left upper lung nodule. Plan to repeat imaging study in 3 months.   09/06/2014 Imaging Bone survey showed no evidence of fracture   09/14/2014 Bone Marrow Biopsy Bone marrow biopsy showed 8% residual plasma cells by manual count but none on the biopsy specimen   09/26/2014 -  Chemotherapy She is started on cycle 1 of maintenance Revlimid   01/31/2015 Imaging  chest x-ray showed pneumonia. Treatment was placed on hold.   05/03/2015 Bone Marrow Biopsy Accession: [id number] repeat bone marrow aspirate and biopsy show 5% residual plasma cells    INTERVAL HISTORY: Please see below for problem oriented charting. She returns for further follow-up. She had recent colonoscopy. Results are good but biopsy were pending. She continues to have mild intermittent diarrhea. She was prescribed something new for migraines, which I don't think is related to treatment. No recent infection. Neuropathy are stable/improving. Her chronic bone pain is stable  with pain medicine as needed intermittently. She continues to take high-dose vitamin D and calcium supplement  REVIEW OF SYSTEMS:   Constitutional: Denies fevers, chills or abnormal weight loss Eyes: Denies blurriness of vision Ears, nose, mouth, throat, and face: Denies mucositis or sore throat Respiratory: Denies cough, dyspnea or wheezes Cardiovascular: Denies palpitation, chest discomfort or lower extremity swelling Skin: Denies abnormal skin rashes Lymphatics: Denies new lymphadenopathy or easy bruising Neurological:Denies numbness, tingling or new weaknesses Behavioral/Psych: Mood is stable, no new changes  All other systems were reviewed with the patient and are negative.  I have reviewed the past medical history, past surgical history, social history and family history with the patient and they are unchanged from previous note.  ALLERGIES:  has No Known Allergies.  MEDICATIONS:  Current Outpatient Prescriptions  Medication Sig Dispense Refill  . aspirin 325 MG tablet Take 325 mg by mouth daily.    . carboxymethylcellulose (REFRESH PLUS) 0.5 % SOLN Place 1 drop into both eyes daily as needed (for dry eyes).     . Cholecalciferol (VITAMIN D3) 2000 UNITS TABS Take 2,000 Units by mouth daily.    . hydrochlorothiazide (HYDRODIURIL) 25 MG tablet Take 25 mg by mouth daily with breakfast.     . HYDROmorphone (DILAUDID) 2 MG tablet Take 1 tablet (2 mg total) by mouth every 6 (six) hours as needed for severe pain. 60 tablet 0  . lenalidomide (REVLIMID) 5 MG capsule Take 1 capsule daily on days 1-21. Repeat every 28 days. 21 capsule 0  . levothyroxine (SYNTHROID, LEVOTHROID) 75 MCG tablet Take 75 mcg by mouth daily before breakfast.    . Multiple Vitamins-Minerals (CENTRUM  SILVER PO) Take 1 tablet by mouth daily.     . ONGLYZA 5 MG TABS tablet Take 5 mg by mouth.    . Oxycodone HCl 10 MG TABS Take 1 tablet (10 mg total) by mouth 3 (three) times daily as needed (for pain). 90 tablet 0  .  polyethylene glycol (MIRALAX / GLYCOLAX) packet Take 17 g by mouth daily.    . traMADol (ULTRAM) 50 MG tablet Take 1 tablet (50 mg total) by mouth every 6 (six) hours as needed for moderate pain. 90 tablet 0  . traZODone (DESYREL) 50 MG tablet Take 1 tablet (50 mg total) by mouth at bedtime. 30 tablet 6  . venlafaxine XR (EFFEXOR-XR) 150 MG 24 hr capsule Take 1 capsule (150 mg total) by mouth daily with breakfast. 30 capsule 12  . verapamil (VERELAN PM) 120 MG 24 hr capsule Take 1 capsule by mouth  daily 30 capsule 11   No current facility-administered medications for this visit.    PHYSICAL EXAMINATION: ECOG PERFORMANCE STATUS: 0 - Asymptomatic  Filed Vitals:   03/26/16 0900  BP: 138/73  Pulse: 59  Temp: 98.2 F (36.8 C)  Resp: 18   Filed Weights   03/26/16 0900  Weight: 168 lb 11.2 oz (76.522 kg)    GENERAL:alert, no distress and comfortable SKIN: skin color, texture, turgor are normal, no rashes or significant lesions EYES: normal, Conjunctiva are pink and non-injected, sclera clear OROPHARYNX:no exudate, no erythema and lips, buccal mucosa, and tongue normal  NECK: supple, thyroid normal size, non-tender, without nodularity LYMPH:  no palpable lymphadenopathy in the cervical, axillary or inguinal LUNGS: clear to auscultation and percussion with normal breathing effort HEART: regular rate & rhythm and no murmurs and no lower extremity edema ABDOMEN:abdomen soft, non-tender and normal bowel sounds Musculoskeletal:no cyanosis of digits and no clubbing  NEURO: alert & oriented x 3 with fluent speech, no focal motor/sensory deficits  LABORATORY DATA:  I have reviewed the data as listed    Component Value Date/Time   NA 141 03/20/2016 0759   NA 142 03/16/2015 1012   K 3.9 03/20/2016 0759   K 3.6 03/16/2015 1012   CL 106 03/16/2015 1012   CO2 27 03/20/2016 0759   CO2 29 03/16/2015 1012   GLUCOSE 85 03/20/2016 0759   GLUCOSE 89 03/16/2015 1012   BUN 11.9 03/20/2016 0759    BUN 12 03/16/2015 1012   CREATININE 0.9 03/20/2016 0759   CREATININE 0.86 03/16/2015 1012   CALCIUM 9.4 03/20/2016 0759   CALCIUM 9.3 03/16/2015 1012   PROT 7.5 03/20/2016 0759   PROT 7.3 03/20/2016 0759   PROT 7.4 03/16/2015 1012   ALBUMIN 3.4* 03/20/2016 0759   ALBUMIN 3.9 03/16/2015 1012   AST 24 03/20/2016 0759   AST 24 03/16/2015 1012   ALT 32 03/20/2016 0759   ALT 30 03/16/2015 1012   ALKPHOS 59 03/20/2016 0759   ALKPHOS 54 03/16/2015 1012   BILITOT 0.68 03/20/2016 0759   BILITOT 0.4 03/16/2015 1012   GFRNONAA 73* 03/16/2015 1012   GFRAA 85* 03/16/2015 1012    No results found for: SPEP, UPEP  Lab Results  Component Value Date   WBC 3.6* 03/26/2016   NEUTROABS 1.3* 03/26/2016   HGB 11.3* 03/26/2016   HCT 35.6 03/26/2016   MCV 73.6* 03/26/2016   PLT 225 03/26/2016      Chemistry      Component Value Date/Time   NA 141 03/20/2016 0759   NA 142 03/16/2015 1012  K 3.9 03/20/2016 0759   K 3.6 03/16/2015 1012   CL 106 03/16/2015 1012   CO2 27 03/20/2016 0759   CO2 29 03/16/2015 1012   BUN 11.9 03/20/2016 0759   BUN 12 03/16/2015 1012   CREATININE 0.9 03/20/2016 0759   CREATININE 0.86 03/16/2015 1012      Component Value Date/Time   CALCIUM 9.4 03/20/2016 0759   CALCIUM 9.3 03/16/2015 1012   ALKPHOS 59 03/20/2016 0759   ALKPHOS 54 03/16/2015 1012   AST 24 03/20/2016 0759   AST 24 03/16/2015 1012   ALT 32 03/20/2016 0759   ALT 30 03/16/2015 1012   BILITOT 0.68 03/20/2016 0759   BILITOT 0.4 03/16/2015 1012     ASSESSMENT & PLAN:  Multiple myeloma in remission ([place]) She is doing well clinically while on treatment. Continue Revlimid at current dose adjustment. Recent bone marrow aspirate and biopsy from 05/03/2015 show residual plasma cells and the patient remained in [facility] I will see her per protocol in 3 months  she will continue aspirin for DVT prophylaxis along with calcium and vitamin D supplementation  She is not receiving Zometa due to history  of osteonecrosis of the jaw.  Drug-induced pancytopenia ([place]) This is likely due to recent treatment. The patient denies recent history of fevers, cough, chills, diarrhea or dysuria. She is asymptomatic from the leukopenia. I will observe for now.  I will continue the chemotherapy at current dose without dosage adjustment.  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 She is taking tramadol or pain medicine as needed. She does not like the sedating side effects from treatment from narcotic prescription. She is currently doing water aerobics 3 times a week and is feeling much better. I continued to encourage her to participate with activity as tolerated. I refill her prescription oxycodone today.  Diarrhea She has intermittent diarrhea that could be related to Revlimid. She had recent colonoscopy, results are good but biopsy of colon polyps are pending. I would defer to her GI doctor for further management   No orders of the defined types were placed in this encounter.   All questions were answered. The patient knows to call the clinic with any problems, questions or concerns. No barriers to learning was detected. I spent 15 minutes counseling the patient face to face. The total time spent in the appointment was 20 minutes and more than 50% was on counseling and review of test results     [facility], [place], MD 03/26/2016 10:17 AM

## 2016-03-27 MED ORDER — LENALIDOMIDE 5 MG PO CAPS CTSU E1A11: ORAL_CAPSULE | ORAL | Status: DC

## 2016-04-02 ENCOUNTER — Ambulatory Visit (INDEPENDENT_AMBULATORY_CARE_PROVIDER_SITE_OTHER): Payer: BLUE CROSS/BLUE SHIELD

## 2016-04-02 DIAGNOSIS — R208 Other disturbances of skin sensation: Secondary | ICD-10-CM | POA: Diagnosis not present

## 2016-04-02 DIAGNOSIS — C7951 Secondary malignant neoplasm of bone: Secondary | ICD-10-CM | POA: Diagnosis not present

## 2016-04-02 DIAGNOSIS — G9529 Other cord compression: Secondary | ICD-10-CM

## 2016-04-02 DIAGNOSIS — H539 Unspecified visual disturbance: Secondary | ICD-10-CM

## 2016-04-02 DIAGNOSIS — R200 Anesthesia of skin: Secondary | ICD-10-CM | POA: Diagnosis not present

## 2016-04-02 DIAGNOSIS — G9520 Unspecified cord compression: Secondary | ICD-10-CM

## 2016-04-02 DIAGNOSIS — H5713 Ocular pain, bilateral: Secondary | ICD-10-CM | POA: Diagnosis not present

## 2016-04-02 DIAGNOSIS — R202 Paresthesia of skin: Secondary | ICD-10-CM

## 2016-04-02 DIAGNOSIS — G4489 Other headache syndrome: Secondary | ICD-10-CM

## 2016-04-02 MED ORDER — GADOPENTETATE DIMEGLUMINE 469.01 MG/ML IV SOLN: 15.0000 mL | Freq: Once | INTRAVENOUS | Status: DC | PRN

## 2016-04-07 ENCOUNTER — Telehealth: Payer: Self-pay | Admitting: *Deleted

## 2016-04-07 NOTE — Telephone Encounter (Signed)
Called and spoke to pt about MRI results per Dr Jaynee Eagles note below. Pt verbalized understanding.

## 2016-04-07 NOTE — Telephone Encounter (Signed)
-----   Message from Melvenia Beam, MD sent at 04/06/2016  9:53 PM EDT ----- MRi of the brain, cervical spine and thoracic spine show no reason for her symptoms. They are largely normal for age, minimal degenerative changes in the cervical and thoracic areas without nerve root pinching and without spinal pathology. Overlal looks good thanks

## 2016-04-15 ENCOUNTER — Other Ambulatory Visit: Payer: Self-pay | Admitting: *Deleted

## 2016-04-15 DIAGNOSIS — C9001 Multiple myeloma in remission: Secondary | ICD-10-CM

## 2016-04-15 MED ORDER — LENALIDOMIDE 5 MG PO CAPS CTSU E1A11: ORAL_CAPSULE | ORAL | Status: DC

## 2016-04-24 ENCOUNTER — Encounter: Payer: BLUE CROSS/BLUE SHIELD | Admitting: *Deleted

## 2016-04-24 ENCOUNTER — Encounter: Payer: Self-pay | Admitting: Hematology and Oncology

## 2016-04-24 ENCOUNTER — Other Ambulatory Visit (HOSPITAL_BASED_OUTPATIENT_CLINIC_OR_DEPARTMENT_OTHER): Payer: BLUE CROSS/BLUE SHIELD

## 2016-04-24 DIAGNOSIS — C9001 Multiple myeloma in remission: Secondary | ICD-10-CM

## 2016-04-24 DIAGNOSIS — Z006 Encounter for examination for normal comparison and control in clinical research program: Secondary | ICD-10-CM

## 2016-04-24 LAB — CBC WITH DIFFERENTIAL/PLATELET
BASO%: 0.5 % (ref 0.0–2.0)
Basophils Absolute: 0 10*3/uL (ref 0.0–0.1)
EOS%: 2.1 % (ref 0.0–7.0)
Eosinophils Absolute: 0.1 10*3/uL (ref 0.0–0.5)
HCT: 36 % (ref 34.8–46.6)
HGB: 11.4 g/dL — ABNORMAL LOW (ref 11.6–15.9)
LYMPH%: 57.5 % — ABNORMAL HIGH (ref 14.0–49.7)
MCH: 23.4 pg — ABNORMAL LOW (ref 25.1–34.0)
MCHC: 31.7 g/dL (ref 31.5–36.0)
MCV: 73.8 fL — ABNORMAL LOW (ref 79.5–101.0)
MONO#: 0.5 10*3/uL (ref 0.1–0.9)
MONO%: 12.1 % (ref 0.0–14.0)
NEUT#: 1.1 10*3/uL — ABNORMAL LOW (ref 1.5–6.5)
NEUT%: 27.8 % — ABNORMAL LOW (ref 38.4–76.8)
Platelets: 216 10*3/uL (ref 145–400)
RBC: 4.88 10*6/uL (ref 3.70–5.45)
RDW: 15.5 % — ABNORMAL HIGH (ref 11.2–14.5)
WBC: 3.8 10*3/uL — ABNORMAL LOW (ref 3.9–10.3)
lymph#: 2.2 10*3/uL (ref 0.9–3.3)

## 2016-04-24 MED ORDER — LENALIDOMIDE 5 MG PO CAPS CTSU E1A11: ORAL_CAPSULE | ORAL | Status: DC

## 2016-04-24 NOTE — Progress Notes (Signed)
04/24/2016 Patient in to clinic for CBC prior to beginning maintenance treatment Cycle 20. Patient returned completed cycle 19 Patient Medication Calendar, confirming correct dosing as prescribed.    Based on CBC results obtained today, patient meets criteria for continued treatment. Patient given printed appointment calendar with Cycle 20 treatment days marked, to document doses for the upcoming cycle, which will begin today, following confirmation of adequate blood counts. Patient voiced understanding. Adverse events are outlined below.  Notified patient by mobile phone that her blood counts were acceptable for continued treatment, and instructed patient to resume dosing of Revlimid 5mg  daily days 1-21 beginning today.  [name] BSN, RN, [place] 04/24/2016 9:20 AM  Adverse Event Log  Study/Protocol: [facility] ECOG [id number] Cycle: Maintenance 19 (03/27/16 - 04/24/16)  Event Grade Comments  Anemia Grade 1   Fatigue  Grade 2  In bed >50% of day for a couple of days.  Sensory neuropathy  Grade 2   Insomnia  Grade 2  Improved with sleeping pill.  Neutrophil count decreased Grade 2    Diarrhea Grade 1 (maximum 4/day) Associated with abdominal pain and felt to be related to consumption of vegetables, per patient.   Non-reportable AEs (grade): - Moderate pain (2) o Joint pain (hip, knee) o Leg pain o Back pain - Abdominal pain - mild in nature (1) - Headaches - moderate (2) - Leukopenia (1) - Vertigo (1) - Depression (1) - lasted for a couple of days  [name] BSN, RN, [place] 04/24/2016 9:20 AM

## 2016-04-30 ENCOUNTER — Encounter: Payer: Self-pay | Admitting: Neurology

## 2016-05-14 ENCOUNTER — Encounter: Payer: Self-pay | Admitting: *Deleted

## 2016-05-14 ENCOUNTER — Other Ambulatory Visit: Payer: Self-pay | Admitting: *Deleted

## 2016-05-14 DIAGNOSIS — C9001 Multiple myeloma in remission: Secondary | ICD-10-CM

## 2016-05-14 MED ORDER — LENALIDOMIDE 5 MG PO CAPS CTSU E1A11: ORAL_CAPSULE | ORAL | Status: DC

## 2016-05-22 ENCOUNTER — Other Ambulatory Visit (HOSPITAL_BASED_OUTPATIENT_CLINIC_OR_DEPARTMENT_OTHER): Payer: BLUE CROSS/BLUE SHIELD

## 2016-05-22 ENCOUNTER — Telehealth: Payer: Self-pay | Admitting: Hematology and Oncology

## 2016-05-22 ENCOUNTER — Encounter: Payer: BLUE CROSS/BLUE SHIELD | Admitting: *Deleted

## 2016-05-22 DIAGNOSIS — Z79899 Other long term (current) drug therapy: Secondary | ICD-10-CM

## 2016-05-22 DIAGNOSIS — C9001 Multiple myeloma in remission: Secondary | ICD-10-CM

## 2016-05-22 DIAGNOSIS — Z006 Encounter for examination for normal comparison and control in clinical research program: Secondary | ICD-10-CM

## 2016-05-22 LAB — CBC WITH DIFFERENTIAL/PLATELET
BASO%: 0.6 % (ref 0.0–2.0)
Basophils Absolute: 0 10*3/uL (ref 0.0–0.1)
EOS%: 3.5 % (ref 0.0–7.0)
Eosinophils Absolute: 0.1 10*3/uL (ref 0.0–0.5)
HCT: 36.7 % (ref 34.8–46.6)
HGB: 11.4 g/dL — ABNORMAL LOW (ref 11.6–15.9)
LYMPH%: 59.5 % — ABNORMAL HIGH (ref 14.0–49.7)
MCH: 22.8 pg — ABNORMAL LOW (ref 25.1–34.0)
MCHC: 31.1 g/dL — ABNORMAL LOW (ref 31.5–36.0)
MCV: 73.2 fL — ABNORMAL LOW (ref 79.5–101.0)
MONO#: 0.4 10*3/uL (ref 0.1–0.9)
MONO%: 12.1 % (ref 0.0–14.0)
NEUT#: 0.8 10*3/uL — ABNORMAL LOW (ref 1.5–6.5)
NEUT%: 24.3 % — ABNORMAL LOW (ref 38.4–76.8)
Platelets: 181 10*3/uL (ref 145–400)
RBC: 5.01 10*6/uL (ref 3.70–5.45)
RDW: 15.6 % — ABNORMAL HIGH (ref 11.2–14.5)
WBC: 3.3 10*3/uL — ABNORMAL LOW (ref 3.9–10.3)
lymph#: 2 10*3/uL (ref 0.9–3.3)

## 2016-05-22 LAB — TSH: TSH: 1.251 m(IU)/L (ref 0.308–3.960)

## 2016-05-22 NOTE — Progress Notes (Signed)
05/22/2016 Patient in to clinic for lab tests (CBC and TSH) prior to beginning maintenance treatment Cycle 21. Patient returned completed cycle 20 Patient Medication Calendar, confirming correct dosing as prescribed.   Patient reports no new complaints, however, she does note that she is having to take pain medication (oxycodone) daily, and is taking it earlier in the day than she did previously. Pain is primarily in her back and left hip area. She takes one pain pill daily on average six days per week; there was one day where she took two pain pills.  Due to [place] of 800 today, treatment will be delayed. Per Dr. Alvy Bimler, patient to return to clinic in one week to recheck CBC. Patient notified of above by phone, and instructed not to begin Revlimid today. She can expect a call later in the day regarding her return appointment. Patient verbalized understanding.  Adverse Event Log Study/Protocol: [facility] ECOG [id number] Cycle: Maintenance 20 (04/24/16 - 05/22/16) Event Grade Comments  Anemia Grade 1   Fatigue  Grade 2  In bed >50% of day for one day.  Sensory neuropathy  Grade 2 Moderate symptoms  Insomnia  Grade 2  Improved with sleeping pill.  Neutrophil count decreased Grade 3    Dyspnea Grade 1 Associated with fatigue   Non-reportable AEs (grade): - Moderate pain (2) o Joint pain (bilateral hip, knee) o Leg pain o Back pain o Bone pain - Abdominal pain - mild in nature (1) - Headaches - minimal this month (1) - Left-sided weakness - mild, not interfering with function (1) - Leukopenia (1)  [name] BSN, RN, [place] 05/22/2016 11:06 AM

## 2016-05-22 NOTE — Telephone Encounter (Signed)
spoke w/ pt confirmed 6/29 apt

## 2016-05-29 ENCOUNTER — Encounter: Payer: BLUE CROSS/BLUE SHIELD | Admitting: *Deleted

## 2016-05-29 ENCOUNTER — Other Ambulatory Visit (HOSPITAL_BASED_OUTPATIENT_CLINIC_OR_DEPARTMENT_OTHER): Payer: BLUE CROSS/BLUE SHIELD

## 2016-05-29 DIAGNOSIS — C9001 Multiple myeloma in remission: Secondary | ICD-10-CM | POA: Diagnosis not present

## 2016-05-29 DIAGNOSIS — Z006 Encounter for examination for normal comparison and control in clinical research program: Secondary | ICD-10-CM

## 2016-05-29 LAB — CBC WITH DIFFERENTIAL/PLATELET
BASO%: 0.5 % (ref 0.0–2.0)
Basophils Absolute: 0 10*3/uL (ref 0.0–0.1)
EOS%: 2.2 % (ref 0.0–7.0)
Eosinophils Absolute: 0.1 10*3/uL (ref 0.0–0.5)
HCT: 37.5 % (ref 34.8–46.6)
HGB: 11.5 g/dL — ABNORMAL LOW (ref 11.6–15.9)
LYMPH%: 63.4 % — ABNORMAL HIGH (ref 14.0–49.7)
MCH: 22.6 pg — ABNORMAL LOW (ref 25.1–34.0)
MCHC: 30.8 g/dL — ABNORMAL LOW (ref 31.5–36.0)
MCV: 73.4 fL — ABNORMAL LOW (ref 79.5–101.0)
MONO#: 0.4 10*3/uL (ref 0.1–0.9)
MONO%: 10.9 % (ref 0.0–14.0)
NEUT#: 0.9 10*3/uL — ABNORMAL LOW (ref 1.5–6.5)
NEUT%: 23 % — ABNORMAL LOW (ref 38.4–76.8)
Platelets: 226 10*3/uL (ref 145–400)
RBC: 5.1 10*6/uL (ref 3.70–5.45)
RDW: 15.7 % — ABNORMAL HIGH (ref 11.2–14.5)
WBC: 3.9 10*3/uL (ref 3.9–10.3)
lymph#: 2.5 10*3/uL (ref 0.9–3.3)

## 2016-05-29 NOTE — Progress Notes (Signed)
05/29/2016 Patient in to clinic today for hematology tests, after one week delay of treatment cycle 21. Patient given printed appointment calendar with Cycle 21 treatment days marked, to document doses for the upcoming cycle, which will begin today, following confirmation of adequate blood counts. Patient voiced understanding. She reports no new complaints today, just ongoing pain that she takes one pain pill for daily. She reports that she is up to swimming one mile (one hour, sixty-six laps), three days per week. After patient's departure from clinic, CBC results were obtained, showing ANC of 900, below the required value for retreatment. Spoke with patient via her mobile phone, noting that counts are too low to resume treatment today. Instead, she will return to the clinic in one week for re-evaluation. Also explained to patient that a dose reduction is likely, as has been previously discussed with her, going to every other day dosing. Patient voice understanding, and will not resume Revlimid at this time, but will be notified of a lab appointment for one week from today. [name] BSN, RN, CCRP 05/29/2016 8:16 AM

## 2016-06-05 ENCOUNTER — Other Ambulatory Visit (HOSPITAL_BASED_OUTPATIENT_CLINIC_OR_DEPARTMENT_OTHER): Payer: BLUE CROSS/BLUE SHIELD

## 2016-06-05 ENCOUNTER — Encounter: Payer: BLUE CROSS/BLUE SHIELD | Admitting: *Deleted

## 2016-06-05 ENCOUNTER — Encounter: Payer: Self-pay | Admitting: Hematology and Oncology

## 2016-06-05 ENCOUNTER — Telehealth: Payer: Self-pay | Admitting: Hematology and Oncology

## 2016-06-05 DIAGNOSIS — Z006 Encounter for examination for normal comparison and control in clinical research program: Secondary | ICD-10-CM | POA: Diagnosis not present

## 2016-06-05 DIAGNOSIS — C9001 Multiple myeloma in remission: Secondary | ICD-10-CM | POA: Diagnosis not present

## 2016-06-05 LAB — CBC WITH DIFFERENTIAL/PLATELET
BASO%: 0.3 % (ref 0.0–2.0)
Basophils Absolute: 0 10*3/uL (ref 0.0–0.1)
EOS%: 1.8 % (ref 0.0–7.0)
Eosinophils Absolute: 0.1 10*3/uL (ref 0.0–0.5)
HCT: 37.5 % (ref 34.8–46.6)
HGB: 11.9 g/dL (ref 11.6–15.9)
LYMPH%: 56.6 % — ABNORMAL HIGH (ref 14.0–49.7)
MCH: 23.3 pg — ABNORMAL LOW (ref 25.1–34.0)
MCHC: 31.7 g/dL (ref 31.5–36.0)
MCV: 73.5 fL — ABNORMAL LOW (ref 79.5–101.0)
MONO#: 0.4 10*3/uL (ref 0.1–0.9)
MONO%: 9.3 % (ref 0.0–14.0)
NEUT#: 1.2 10*3/uL — ABNORMAL LOW (ref 1.5–6.5)
NEUT%: 32 % — ABNORMAL LOW (ref 38.4–76.8)
Platelets: 240 10*3/uL (ref 145–400)
RBC: 5.1 10*6/uL (ref 3.70–5.45)
RDW: 15.7 % — ABNORMAL HIGH (ref 11.2–14.5)
WBC: 3.9 10*3/uL (ref 3.9–10.3)
lymph#: 2.2 10*3/uL (ref 0.9–3.3)

## 2016-06-05 NOTE — Telephone Encounter (Signed)
left msg confiming apt times

## 2016-06-05 NOTE — Progress Notes (Signed)
06/05/2016 Patient in to clinic today for hematology tests, after second week of delay for treatment cycle 21. Patient given printed appointment calendar with Cycle 21 treatment days marked with dose reduction, to document doses for the upcoming cycle, which will begin today, following confirmation of adequate blood counts. Patient given instructions to take Revlimid once every other day, beginning today, and ending on Day 21 (06/25/16). She was notified that she will have pills remaining at the end of this cycle, due to dose reduction. Patient voiced understanding. She reports ongoing back pain daily, requiring daily pain medication, with one day where she took two pain pills (AM and PM) instead of just one.  Spoke with patient via her mobile phone, confirming counts acceptable to resume treatment today.Patient is aware that it will be necessary to reschedule her upcoming appointments, due to the delay in treatment. [name] BSN, RN, [place] 06/05/2016 8:49 AM

## 2016-06-12 ENCOUNTER — Other Ambulatory Visit: Payer: BLUE CROSS/BLUE SHIELD

## 2016-06-13 ENCOUNTER — Other Ambulatory Visit: Payer: Self-pay | Admitting: *Deleted

## 2016-06-17 ENCOUNTER — Ambulatory Visit: Payer: 59 | Admitting: Neurology

## 2016-06-17 ENCOUNTER — Other Ambulatory Visit: Payer: Self-pay | Admitting: *Deleted

## 2016-06-17 MED ORDER — LENALIDOMIDE 5 MG PO CAPS: ORAL_CAPSULE | ORAL | Status: DC

## 2016-06-19 ENCOUNTER — Ambulatory Visit: Payer: BLUE CROSS/BLUE SHIELD | Admitting: Hematology and Oncology

## 2016-06-25 ENCOUNTER — Encounter: Payer: Self-pay | Admitting: Neurology

## 2016-06-25 ENCOUNTER — Ambulatory Visit (INDEPENDENT_AMBULATORY_CARE_PROVIDER_SITE_OTHER): Payer: BLUE CROSS/BLUE SHIELD | Admitting: Neurology

## 2016-06-25 VITALS — BP 125/76 | HR 74 | Ht 67.0 in | Wt 169.0 lb

## 2016-06-25 DIAGNOSIS — R200 Anesthesia of skin: Secondary | ICD-10-CM

## 2016-06-25 DIAGNOSIS — C9000 Multiple myeloma not having achieved remission: Secondary | ICD-10-CM

## 2016-06-25 DIAGNOSIS — M5440 Lumbago with sciatica, unspecified side: Secondary | ICD-10-CM

## 2016-06-25 DIAGNOSIS — M5417 Radiculopathy, lumbosacral region: Secondary | ICD-10-CM

## 2016-06-25 DIAGNOSIS — R202 Paresthesia of skin: Secondary | ICD-10-CM | POA: Diagnosis not present

## 2016-06-25 NOTE — Progress Notes (Signed)
[facility]    Provider:  Dr Jaynee Eagles Referring Provider: Wenda Low, MD Primary Care Physician:  Wenda Low, MD    CC: Chronic migraines and episodes of body paresthesias  Interval history 06/25/2016: She gets 3-4 migraines a month, they last an hour and go away with the tramadol. She still have low back pain and radiation into the legs. She has numbness and weakness in the legs intermittently but MRI of the neural axis was negative. Kneeling thing we didn't do was imaging MRI of the lumbar spine. Considering that she continues to have numbness and weakness in the legs will image MRI of the lumbar spine.  Interval update 03/18/2016: This is a lovely 60 year old female with a very complicated past medical history including hypertension, thyroid disorder, migraine, multiple myeloma s/p chemo currently participating in a research trial, neuropathy, insomnia, bone pain, back pain, joint pain, anemia, fatigue, leukopenia due to antineoplastic chemotherapy, right leg swelling, DVT, diabetes. I have seen patient in the past for chronic migraines but today she is here for a new problem, paresthesias that started February. Happens when waking up in the morning. Never happens otherwise. She feels an electrical current through her body with persistent tingling.  Starts in the thoracic or in the lumbar spine. Feels like electricity. Starts in the spine. She feels tingling all over, in the face, arms, legs from head to toe.  Will last several hours. A lot of times it is associated with a migraine. Nothing makes it better. Stretching aggravates the symptoms. She has pain in the spine associated with the paresthesias. Last night it started with numbness at 2am. She couldn't get back to sleep. She developed a headache. He still has residual headache and Her left eye still fells sore and head feels sore. Also a residual burning feeling. Arms feel like they are burning. Episodes becoming more  frequent. 6 episodes in Feb, 8 in march, 13 so far in April. Becoming more frequent and lasting longer. She experiences facial numbness with the episodes, burning and tingling in the spine that spreads to her whole body. The symptoms are usually associated with headache or migraine. Moving around helps with the symptoms. No weakness. No vision changes but does describe eye pain. The headaches are pressure and pain.she still gets the vertigo with the migraines. Headache left side of the head with the vertigo, no light or sound sensitivity. 5-10 minutes after the paresthesias she has the migraines/headaches. No inciting events. No trauma. She has low back pain, situated on the right side, radiates down the legs to the back of the legs, walking is not as good. Right leg goes numb. Paresthesias are more on the left.   Recent labs include normal B12. Normal TSH.  Interval update 06/19/2015: This is a former patient of Dr. Janann Colonel who is seen in our office by our nurse practitioner [name]. Patient is establishing care with me today. She is a 60 year old female with a history of chronic migraines. She was last seen in April. Today she returns and she is doing very well. She is on verapamil 120 mg extended release and venlafaxine XR 75 mg. She is doing very well. She has had maybe 3 migraines within the last few months. And they have been on the left side of the head with vertigo twice. When she has the vertigo, she has to lay in bed and sleep it off. But this is a significant improvement and she is very happy with this maintenance. She endorses nausea,  photophobia and phonophobia. She takes tramadol for acute management. Discussed in detail that if she is doing well, we will keep her on her current migraine management. She can continue to follow up with [name] as needed.   HISTORY OF PRESENT ILLNESS: Ms. Sylvan is a 60 year old female with a history of chronic migraines. She returns today for  follow-up. The patient is currently taking Effexor 37.5 mg daily as well as verapamil 120 mg daily. She reports that at the last visit Dr. Janann Colonel decreased her Effexor. She states since then she has had 13 headaches in March and she has had 4 headaches in April so far. She states her headaches are normally located on the left side and associated with left-sided numbness. She confirms nausea, photophobia and phonophobia. She also states that when the headaches become severe its as if she is on the verge of having vertigo. She states that she can take tramadol and her headache will normally resolve in 1-2 hours. She states that if she gets a headache on the right side it usually resolves with Advil migraine. The patient does have multiple myeloma and takes a chemotherapy pill. She denies any new neurological symptoms. Denies any new medical issues. She returns today for evaluation.  HISTORY 08/02/14 Janann Colonel): Shaletha JANYLAH BELGRAVE is a 60 y.o. female, former Dr Erling Cruz patient, here as a follow up appointment for chronic migraines with last visit being on 08/09/2013 at which time she received Botox injections. She notes good benefit from these injections. Did not follow up for repeat injections. Since last visit she was unfortunately diagnosed with multiple myeloma and has been undergoing treatment with oncology. Reports so far things are going well. She has one more cycle and then she is finished. She reports she has held off on the Botox injections per recommendation of her oncologist, instructed she can restart once she is done with chemotherapy.   Continues to have headaches. Typically will have 1 to 3 headaches per month. Takes tramadol and falls asleep, when she wakes up the headache is gone. Describes it is a predominantly right sided temporal squeezing pounding type pain. + Nausea, no emesis, + photo and phonophobia. Headaches last 6 hours to all day. Notes she gets weak on the left side with the headaches and has  baseline numbness on the left side of her body. Reports she has not had a MRI brain in years. She notes Dr Erling Cruz started her on Effexor and Verapamil. She wishes to get off both of these medications as she feels they are not helping.   Recently saw eye doctor for right eye pain, told she may have glaucoma in her right eye, they will continue to monitor. They also told her that she has dry eyes, was given lubricating eye drops. The eye drops have improved the eye pain.   Initial visit 05/2013: Former Dr Erling Cruz patient. She reports being overall stable. Continues to have daily migrainous headache which have been ongoing for greater then 4 years. Predominantly R sided temporal, described as squeezing and pounding. These headaches are her baseline headache and are occuring on a near continuous pattern every day of the month. Is also having severe "flare ups" where the pain worsens and becomes a more sharp/intense pain. These episodes are occuring around 8 to 10 times per month. With these headaches she continues to have episodes of transient unilateral (predominantly R sided) weakness that self resolves. Also notes occasional unilateral sensory changes (again R sided predominant). Also has episodes  of transient vertigo associated with the headache, has occurred 2 times in the past month, requiring her to miss work. She has had multiple MRIs during these events to rule out stroke and no ischemic changes have been found. Has been unable to tolerate most abortive agents and due to presence of hemiplegic/basilar symptoms she is not a candidate for triptans or DHE. In regards to prophylactic agents she has tried for >3-4 months the following agents and failed/did not tolerate: Topamax (AED), Propranolol (BB), verapamil (CCB) and Effexor (SNRI). Remains on verapmil and Effexor per Dr Erling Cruz but no benefit noted. Denies any family history of hemiplegic migraine.   Continues to have subjective memory difficulties, notes a  "fogginess". Has had formal neuro-psych testing which showed an improvement in her overall cognitive function. Continues to have episodes of auditory hallucinations. Has not seen a psychiatrist for this.    Review of Systems: Patient complains of symptoms per HPI as well as the following symptoms: Joint pain, back pain, numbness. Pertinent negatives per HPI. All others negative.  Social History   Social History  . Marital status: Married    Spouse name: Arnell Sieving  . Number of children: 2  . Years of education: 14   Occupational History  .  Lab [employer]   Social History Main Topics  . Smoking status: Never Smoker  . Smokeless tobacco: Never Used  . Alcohol use No  . Drug use: No  . Sexual activity: Not on file   Other Topics Concern  . Not on file   Social History Narrative   Patient lives at home with her husband Special educational needs teacher). Patient has two years college.   Right handed.   Caffeine- None    Family History  Problem Relation Age of Onset  . Throat cancer Father   . Stroke Father   . Cancer Brother     prostate    Past Medical History:  Diagnosis Date  . Abnormal thyroid function test 08/30/2014  . Anemia   . Anemia, unspecified 10/06/2013  . Bone pain 10/21/2013  . Bronchitis 01/31/2015  . Diabetes mellitus without complication ([place]) 05/1218   Steroid induced diabetes. has not picked oral med up from pharmacy as of 09-14-14  . Diverticulitis 07/18/2014  . DVT (deep venous thrombosis) ([place]) 02/07/2014  . HBP (high blood pressure)   . Insomnia 02/11/2016  . Leukopenia due to antineoplastic chemotherapy 12/27/2013  . Memory loss   . MGUS (monoclonal gammopathy of unknown significance)   . MGUS (monoclonal gammopathy of unknown significance) 10/06/2013  . Migraine   . Multiple myeloma, without mention of having achieved remission 11/16/2013  . Pancytopenia ([place]) 12/27/2015  . Peripheral neuropathy ([place]) 12/27/2015  . Right leg swelling 02/07/2014  . Seizure ([place]) 1960   single  seizure episode at age 23  . Thyroid disorder   . Vitamin D deficiency 10/24/2014    Past Surgical History:  Procedure Laterality Date  . HEMORRHOID SURGERY    . PORT-A-CATH REMOVAL  10-2014  . PORTACATH PLACEMENT Right jan 2015    Current Outpatient Prescriptions  Medication Sig Dispense Refill  . aspirin 325 MG tablet Take 325 mg by mouth daily.    . carboxymethylcellulose (REFRESH PLUS) 0.5 % SOLN Place 1 drop into both eyes daily as needed (for dry eyes).     . Cholecalciferol (VITAMIN D3) 2000 UNITS TABS Take 2,000 Units by mouth daily.    . hydrochlorothiazide (HYDRODIURIL) 25 MG tablet Take 25 mg by mouth daily with breakfast.     .  HYDROmorphone (DILAUDID) 2 MG tablet Take 1 tablet (2 mg total) by mouth every 6 (six) hours as needed for severe pain. 60 tablet 0  . [START ON 07/03/2016] lenalidomide (REVLIMID) 5 MG capsule Take 5 mg every other day, Days 1-21, off 7 days. Start 07/03/16 11 capsule 0  . levothyroxine (SYNTHROID, LEVOTHROID) 75 MCG tablet Take 75 mcg by mouth daily before breakfast.    . Multiple Vitamins-Minerals (CENTRUM SILVER PO) Take 1 tablet by mouth daily.     . ONGLYZA 5 MG TABS tablet Take 5 mg by mouth.    . Oxycodone HCl 10 MG TABS Take 1 tablet (10 mg total) by mouth 3 (three) times daily as needed (for pain). 90 tablet 0  . polyethylene glycol (MIRALAX / GLYCOLAX) packet Take 17 g by mouth daily.    . traMADol (ULTRAM) 50 MG tablet Take 1 tablet (50 mg total) by mouth every 6 (six) hours as needed for moderate pain. 90 tablet 0  . traZODone (DESYREL) 50 MG tablet Take 1 tablet (50 mg total) by mouth at bedtime. 30 tablet 6  . venlafaxine XR (EFFEXOR-XR) 150 MG 24 hr capsule Take 1 capsule (150 mg total) by mouth daily with breakfast. 30 capsule 12  . verapamil (VERELAN PM) 120 MG 24 hr capsule Take 1 capsule by mouth  daily 30 capsule 11   No current facility-administered medications for this visit.    Facility-Administered Medications Ordered in Other  Visits  Medication Dose Route Frequency Provider Last Rate Last Dose  . gadopentetate dimeglumine (MAGNEVIST) injection 15 mL  15 mL Intravenous Once PRN Melvenia Beam, MD        Allergies as of 06/25/2016  . (No Known Allergies)    Vitals: BP 125/76 (BP Location: Right Arm, Patient Position: Sitting, Cuff Size: Normal)   Pulse 74   Ht _0  (1.702 m)   Wt 169 lb (76.7 kg)   BMI 26.47 kg/m  Last Weight:  Wt Readings from Last 1 Encounters:  06/25/16 169 lb (76.7 kg)   Last Height:   Ht Readings from Last 1 Encounters:  06/25/16 _1  (1.702 m)     Neuro: Detailed Neurologic Exam  Speech:  Speech is normal; fluent and spontaneous with normal comprehension.  Cognition:  The patient is oriented to person, place, and time;   recent and remote memory intact;   language fluent;   normal attention, concentration,   fund of knowledge Cranial Nerves:  The pupils are equal, round, and reactive to light. The fundi are flat. Visual fields are full to finger confrontation. Extraocular movements are intact. Trigeminal sensation is intact and the muscles of mastication are normal. The face is symmetric. The palate elevates in the midline. Hearing intact. Voice is normal. Shoulder shrug is normal. The tongue has normal motion without fasciculations.   Motor Observation:  No asymmetry, no atrophy, and no involuntary movements noted. Tone:  Normal muscle tone.   Gait: mildly antalgic. Can heel and toe walk, slight imbalance with tandem. Mild right biceps femoris weakness.   Posture:  Posture is normal. normal erect   Strength:  Strength is V/V in the upper and lower limbs.    Sensation: Still has a SENSORY LEVEL AT T12       Assessment/Plan: This is a lovely 60 year old female with a very complicated past medical history including hypertension, thyroid disorder, migraine, multiple myeloma s/p chemo currently participating in a  research trial, neuropathy, insomnia, bone pain, back pain, joint pain,  anemia, fatigue, leukopenia due to antineoplastic chemotherapy, right leg swelling, DVT, diabetes. She has continued numbness and weakness in the lower extremities. MRI imaging of the brain, cervical spinal and thoracic spine were negative. She still has numbness from the abdomen down the legs. She still has weakness in the legs. She still has low back pain and radiation. We'll order MRI of the lumbar spine       Assessment/Plan:    Sarina Ill, MD  [facility] [address] [place], [place] [address]  Phone [phone] Fax [phone]  A total of 30 minutes was spent face-to-face with this patient. Over half this time was spent on counseling patient on the migraine, LE weakness and numbness diagnosis and different diagnostic and therapeutic options available.

## 2016-06-26 ENCOUNTER — Other Ambulatory Visit (HOSPITAL_BASED_OUTPATIENT_CLINIC_OR_DEPARTMENT_OTHER): Payer: BLUE CROSS/BLUE SHIELD

## 2016-06-26 DIAGNOSIS — C9001 Multiple myeloma in remission: Secondary | ICD-10-CM

## 2016-06-26 LAB — COMPREHENSIVE METABOLIC PANEL
ALT: 30 U/L (ref 0–55)
AST: 23 U/L (ref 5–34)
Albumin: 3.7 g/dL (ref 3.5–5.0)
Alkaline Phosphatase: 62 U/L (ref 40–150)
Anion Gap: 10 mEq/L (ref 3–11)
BUN: 10.2 mg/dL (ref 7.0–26.0)
CO2: 26 mEq/L (ref 22–29)
Calcium: 9.4 mg/dL (ref 8.4–10.4)
Chloride: 104 mEq/L (ref 98–109)
Creatinine: 0.9 mg/dL (ref 0.6–1.1)
EGFR: 84 mL/min/{1.73_m2} — ABNORMAL LOW (ref >90)
Glucose: 90 mg/dl (ref 70–140)
Potassium: 3.5 mEq/L (ref 3.5–5.1)
Sodium: 140 mEq/L (ref 136–145)
Total Bilirubin: 0.69 mg/dL (ref 0.20–1.20)
Total Protein: 8 g/dL (ref 6.4–8.3)

## 2016-06-26 LAB — CBC WITH DIFFERENTIAL/PLATELET
BASO%: 0.6 % (ref 0.0–2.0)
Basophils Absolute: 0 10*3/uL (ref 0.0–0.1)
EOS%: 2.2 % (ref 0.0–7.0)
Eosinophils Absolute: 0.1 10*3/uL (ref 0.0–0.5)
HCT: 38.3 % (ref 34.8–46.6)
HGB: 12.3 g/dL (ref 11.6–15.9)
LYMPH%: 53.8 % — ABNORMAL HIGH (ref 14.0–49.7)
MCH: 23.6 pg — ABNORMAL LOW (ref 25.1–34.0)
MCHC: 32.1 g/dL (ref 31.5–36.0)
MCV: 73.4 fL — ABNORMAL LOW (ref 79.5–101.0)
MONO#: 0.4 10*3/uL (ref 0.1–0.9)
MONO%: 11.6 % (ref 0.0–14.0)
NEUT#: 1 10*3/uL — ABNORMAL LOW (ref 1.5–6.5)
NEUT%: 31.8 % — ABNORMAL LOW (ref 38.4–76.8)
Platelets: 185 10*3/uL (ref 145–400)
RBC: 5.22 10*6/uL (ref 3.70–5.45)
RDW: 15.5 % — ABNORMAL HIGH (ref 11.2–14.5)
WBC: 3.2 10*3/uL — ABNORMAL LOW (ref 3.9–10.3)
lymph#: 1.7 10*3/uL (ref 0.9–3.3)

## 2016-06-26 LAB — LACTATE DEHYDROGENASE: LDH: 192 U/L (ref 125–245)

## 2016-06-27 ENCOUNTER — Other Ambulatory Visit: Payer: Self-pay | Admitting: *Deleted

## 2016-06-27 LAB — KAPPA/LAMBDA LIGHT CHAINS
Ig Kappa Free Light Chain: 27.2 mg/L — ABNORMAL HIGH (ref 3.3–19.4)
Ig Lambda Free Light Chain: 21.8 mg/L (ref 5.7–26.3)
Kappa/Lambda FluidC Ratio: 1.25 (ref 0.26–1.65)

## 2016-06-30 LAB — MULTIPLE MYELOMA PANEL, SERUM
Albumin SerPl Elph-Mcnc: 3.5 g/dL (ref 2.9–4.4)
Albumin/Glob SerPl: 1.1 (ref 0.7–1.7)
Alpha 1: 0.2 g/dL (ref 0.0–0.4)
Alpha2 Glob SerPl Elph-Mcnc: 0.5 g/dL (ref 0.4–1.0)
B-Globulin SerPl Elph-Mcnc: 1.1 g/dL (ref 0.7–1.3)
Gamma Glob SerPl Elph-Mcnc: 1.8 g/dL (ref 0.4–1.8)
Globulin, Total: 3.5 g/dL (ref 2.2–3.9)
IgA, Qn, Serum: 137 mg/dL (ref 87–352)
IgG, Qn, Serum: 1959 mg/dL — ABNORMAL HIGH (ref 700–1600)
IgM, Qn, Serum: 63 mg/dL (ref 26–217)
Total Protein: 7 g/dL (ref 6.0–8.5)

## 2016-07-02 ENCOUNTER — Telehealth: Payer: Self-pay | Admitting: Neurology

## 2016-07-02 ENCOUNTER — Encounter: Payer: Self-pay | Admitting: Hematology and Oncology

## 2016-07-02 ENCOUNTER — Other Ambulatory Visit: Payer: Self-pay | Admitting: *Deleted

## 2016-07-02 DIAGNOSIS — C9001 Multiple myeloma in remission: Secondary | ICD-10-CM

## 2016-07-02 MED ORDER — LENALIDOMIDE 5 MG PO CAPS CTSU E1A11: ORAL_CAPSULE | ORAL | 0 refills | Status: DC

## 2016-07-02 NOTE — Telephone Encounter (Signed)
This pt's MRI is requiring a peer to peer. Please call [phone]. This expires in 2 days. Thank you

## 2016-07-02 NOTE — Addendum Note (Signed)
Addended by: Benson Norway on: 07/02/2016 12:30 PM   Modules accepted: Orders

## 2016-07-02 NOTE — Telephone Encounter (Signed)
Called but office is closed will try again in the morning

## 2016-07-03 ENCOUNTER — Telehealth: Payer: Self-pay | Admitting: Hematology and Oncology

## 2016-07-03 ENCOUNTER — Encounter: Payer: Self-pay | Admitting: Hematology and Oncology

## 2016-07-03 ENCOUNTER — Encounter: Payer: BLUE CROSS/BLUE SHIELD | Admitting: *Deleted

## 2016-07-03 ENCOUNTER — Ambulatory Visit (HOSPITAL_BASED_OUTPATIENT_CLINIC_OR_DEPARTMENT_OTHER): Payer: BLUE CROSS/BLUE SHIELD | Admitting: Hematology and Oncology

## 2016-07-03 ENCOUNTER — Other Ambulatory Visit: Payer: Self-pay | Admitting: *Deleted

## 2016-07-03 VITALS — BP 141/86 | HR 68 | Temp 98.0°F | Resp 18 | Ht 67.0 in | Wt 166.3 lb

## 2016-07-03 DIAGNOSIS — D61811 Other drug-induced pancytopenia: Secondary | ICD-10-CM | POA: Diagnosis not present

## 2016-07-03 DIAGNOSIS — C9001 Multiple myeloma in remission: Secondary | ICD-10-CM

## 2016-07-03 DIAGNOSIS — G893 Neoplasm related pain (acute) (chronic): Secondary | ICD-10-CM | POA: Diagnosis not present

## 2016-07-03 DIAGNOSIS — M899 Disorder of bone, unspecified: Secondary | ICD-10-CM

## 2016-07-03 DIAGNOSIS — M898X9 Other specified disorders of bone, unspecified site: Secondary | ICD-10-CM

## 2016-07-03 DIAGNOSIS — Z006 Encounter for examination for normal comparison and control in clinical research program: Secondary | ICD-10-CM | POA: Diagnosis not present

## 2016-07-03 MED ORDER — LENALIDOMIDE 5 MG PO CAPS CTSU E1A11: ORAL_CAPSULE | ORAL | 0 refills | Status: DC

## 2016-07-03 MED ORDER — OXYCODONE HCL 10 MG PO TABS: 10.0000 mg | ORAL_TABLET | Freq: Three times a day (TID) | ORAL | 0 refills | Status: DC | PRN

## 2016-07-03 NOTE — Progress Notes (Addendum)
07/03/2016 Patient in to clinic today for MD visit prior to beginning maintenance cycle 22. Patient returned completed cycle 21 Medication Calendar, confirming correct dosing as prescribed (dose level -3, Revlimid 72m every other day, Days 1-21)..  End of Cycle 21 lab tests were collected on 06/26/2016, one week in advance of delayed cycle start, to allow time for disease assessment tests to be resulted prior to the MD visit.   Based on lab results obtained on 06/26/16, patient meets criteria for continued treatment. Patient given printed appointment calendar with Cycle 22 treatment days marked, to document doses for the upcoming cycle, beginning today. Patient voiced understanding. Patient reports that she did not receive a refill of study medication - she was contacted by the mail-order pharmacy, and when she stated that she had drug remaining from prior refill, due to dose reduction, the pharmacy order was cancelled. Notified Dr. GCalton Dachnurse, TSharlynn Oliphant of this information, noting that patient has only 10 doses remaining from the prior refill, but will need a total of 11 to complete this treatment cycle. Ms. HMatthew Sarasto follow up regarding a prescription to cover this dose.  Adverse events are outlined below.  Patient and MD are aware of bone marrow biopsy and metastatic bone survey, to be performed at the end of cycle 24, approximately October 2017. Per Dr. GAlvy Bimler bone marrow appointment can be requested at the beginning of October. Treatment is expected to continue (indefinite Revlimid) until evidence of disease progression.  Cindy S. SBrigitte PulseBSN, RN, CBangor Base8/01/2016 3:33 PM  Adverse Event Log  Study/Protocol: CTSU ECOG [id number] Cycles: Maintenance 19-21 (03/27/16 - 07/02/2016)  Event Grade Attribution to lenalidomide Comments  anemia Grade 1 Probable   Fatigue  Grade 2 Probable Also reported as low energy.  Sensory neuropathy  Grade 2 Unlikely Unchanged from previous, moderate symptoms   Insomnia  Grade 2 Unlikely Has improved, since taking pain pill in evening  Neutrophil count decreased Grade 2-3  Definite   Dyspnea  Grade 1 Unlikely With moderate exertion, Cycles 20-21  Diarrhea Grade 1 Probable During cycle 19   Non-reportable AEs (grade): - Moderate pain (2) o Bone pain o Joint pain (hip, knee) o Leg pain  o Back pain o Generalized (reported 05/29/2016) - Abdominal pain, mild (1) Cycle 19 - Headaches, migraines - moderate (2) - Leukopenia (1) - Decreased eGFR (1) - Vertigo (1) Cycle 19 - Depression (1) Cycle 19 - left-sided weakness, mild (1) Cycle 20 - lower extremity weakness, reported by patient, resolved (1) - impaired balance, mild (1) Cindy S. SBrigitte PulseBSN, RN, CCRP 07/08/2016 4:14 PM

## 2016-07-03 NOTE — Progress Notes (Signed)
Carla Perry OFFICE PROGRESS NOTE  Patient Care Team: Wenda Low, MD as PCP - General (Internal Medicine) Heath Lark, MD as Consulting Physician (Hematology and Oncology)  SUMMARY OF ONCOLOGIC HISTORY: Oncology History   ISS stage 1 IgG lambda subtype (serum albumin 3.6, Beta2 microglobulin 2.32) Durie Salmon Stage 1     Multiple myeloma in remission ([place])   10/10/2013 Imaging    Skeletal survery was negative     11/09/2013 Bone Marrow Biopsy    BM biopsy confirmed myeloma, 76% involved, IgG lambda subtype     12/06/2013 - 08/29/2014 Chemotherapy    Sh received chemo with revlimid, Velcade, Dexamethasone and Zometa. 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     05/05/2014 Imaging    Imaging study of the neck showed no explanation that could cause right neck pain. She is noted to have incidental left upper lung nodule. Plan to repeat imaging study in 3 months.     09/06/2014 Imaging    Bone survey showed no evidence of fracture     09/14/2014 Bone Marrow Biopsy    Bone marrow biopsy showed 8% residual plasma cells by manual count but none on the biopsy specimen     09/26/2014 -  Chemotherapy    She is started on cycle 1 of maintenance Revlimid     01/31/2015 Imaging     chest x-ray showed pneumonia. Treatment was placed on hold.     05/03/2015 Bone Marrow Biopsy    Accession: [id number] repeat bone marrow aspirate and biopsy show 5% residual plasma cells      INTERVAL HISTORY: Please see below for problem oriented charting. She feels well. Denies recent infection. She continues to have intermittent bone pain, well-controlled with current prescription pain medicine. She takes them rarely. No recent new complaints  REVIEW OF SYSTEMS:   Constitutional: Denies fevers, chills or  abnormal weight loss Eyes: Denies blurriness of vision Ears, nose, mouth, throat, and face: Denies mucositis or sore throat Respiratory: Denies cough, dyspnea or wheezes Cardiovascular: Denies palpitation, chest discomfort or lower extremity swelling Gastrointestinal:  Denies nausea, heartburn or change in bowel habits Skin: Denies abnormal skin rashes Lymphatics: Denies new lymphadenopathy or easy bruising Neurological:Denies numbness, tingling or new weaknesses Behavioral/Psych: Mood is stable, no new changes  All other systems were reviewed with the patient and are negative.  I have reviewed the past medical history, past surgical history, social history and family history with the patient and they are unchanged from previous note.  ALLERGIES:  has No Known Allergies.  MEDICATIONS:  Current Outpatient Prescriptions  Medication Sig Dispense Refill  . aspirin 325 MG tablet Take 325 mg by mouth daily.    . carboxymethylcellulose (REFRESH PLUS) 0.5 % SOLN Place 1 drop into both eyes daily as needed (for dry eyes).     . Cholecalciferol (VITAMIN D3) 2000 UNITS TABS Take 2,000 Units by mouth daily.    . hydrochlorothiazide (HYDRODIURIL) 25 MG tablet Take 25 mg by mouth daily with breakfast.     . lenalidomide (REVLIMID) 5 MG capsule Take 1 capsule every other day for 21 days, off 7 days. Repeat every 28 days. 11 capsule 0  . levothyroxine (SYNTHROID, LEVOTHROID) 75 MCG tablet Take 75 mcg by mouth daily before breakfast.    .  Multiple Vitamins-Minerals (CENTRUM SILVER PO) Take 1 tablet by mouth daily.     . ONGLYZA 5 MG TABS tablet Take 5 mg by mouth.    . Oxycodone HCl 10 MG TABS Take 1 tablet (10 mg total) by mouth 3 (three) times daily as needed (for pain). 90 tablet 0  . polyethylene glycol (MIRALAX / GLYCOLAX) packet Take 17 g by mouth daily.    . traMADol (ULTRAM) 50 MG tablet Take 1 tablet (50 mg total) by mouth every 6 (six) hours as needed for moderate pain. 90 tablet 0  .  traZODone (DESYREL) 50 MG tablet Take 1 tablet (50 mg total) by mouth at bedtime. 30 tablet 6  . venlafaxine XR (EFFEXOR-XR) 150 MG 24 hr capsule Take 1 capsule (150 mg total) by mouth daily with breakfast. 30 capsule 12  . verapamil (VERELAN PM) 120 MG 24 hr capsule Take 1 capsule by mouth  daily 30 capsule 11   No current facility-administered medications for this visit.    Facility-Administered Medications Ordered in Other Visits  Medication Dose Route Frequency Provider Last Rate Last Dose  . gadopentetate dimeglumine (MAGNEVIST) injection 15 mL  15 mL Intravenous Once PRN Melvenia Beam, MD        PHYSICAL EXAMINATION: ECOG PERFORMANCE STATUS: 1 - Symptomatic but completely ambulatory  Vitals:   07/03/16 0832  BP: (!) 141/86  Pulse: 68  Resp: 18  Temp: 98 F (36.7 C)   Filed Weights   07/03/16 0832  Weight: 166 lb 4.8 oz (75.4 kg)    GENERAL:alert, no distress and comfortable SKIN: skin color, texture, turgor are normal, no rashes or significant lesions EYES: normal, Conjunctiva are pink and non-injected, sclera clear OROPHARYNX:no exudate, no erythema and lips, buccal mucosa, and tongue normal  NECK: supple, thyroid normal size, non-tender, without nodularity LYMPH:  no palpable lymphadenopathy in the cervical, axillary or inguinal LUNGS: clear to auscultation and percussion with normal breathing effort HEART: regular rate & rhythm and no murmurs and no lower extremity edema ABDOMEN:abdomen soft, non-tender and normal bowel sounds Musculoskeletal:no cyanosis of digits and no clubbing  NEURO: alert & oriented x 3 with fluent speech, no focal motor/sensory deficits  LABORATORY DATA:  I have reviewed the data as listed    Component Value Date/Time   NA 140 06/26/2016 0741   K 3.5 06/26/2016 0741   CL 106 03/16/2015 1012   CO2 26 06/26/2016 0741   GLUCOSE 90 06/26/2016 0741   BUN 10.2 06/26/2016 0741   CREATININE 0.9 06/26/2016 0741   CALCIUM 9.4 06/26/2016 0741    PROT 8.0 06/26/2016 0741   ALBUMIN 3.7 06/26/2016 0741   AST 23 06/26/2016 0741   ALT 30 06/26/2016 0741   ALKPHOS 62 06/26/2016 0741   BILITOT 0.69 06/26/2016 0741   GFRNONAA 73 (L) 03/16/2015 1012   GFRAA 85 (L) 03/16/2015 1012    No results found for: SPEP, UPEP  Lab Results  Component Value Date   WBC 3.2 (L) 06/26/2016   NEUTROABS 1.0 (L) 06/26/2016   HGB 12.3 06/26/2016   HCT 38.3 06/26/2016   MCV 73.4 (L) 06/26/2016   PLT 185 06/26/2016      Chemistry      Component Value Date/Time   NA 140 06/26/2016 0741   K 3.5 06/26/2016 0741   CL 106 03/16/2015 1012   CO2 26 06/26/2016 0741   BUN 10.2 06/26/2016 0741   CREATININE 0.9 06/26/2016 0741      Component Value Date/Time  CALCIUM 9.4 06/26/2016 0741   ALKPHOS 62 06/26/2016 0741   AST 23 06/26/2016 0741   ALT 30 06/26/2016 0741   BILITOT 0.69 06/26/2016 0741       ASSESSMENT & PLAN:  Multiple myeloma in remission ([place]) She is doing well clinically while on treatment. Continue Revlimid at current dose adjustment. 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  Drug-induced pancytopenia ([place]) This is likely due to recent treatment. The patient denies recent history of fevers, cough, chills, diarrhea or dysuria. She is asymptomatic from the leukopenia. I will observe for now.  I will continue the chemotherapy at current dose without dosage adjustment.  If the leukopenia gets progressive worse in the future, I might have to delay her treatment or adjust the chemotherapy dose per protocol    Malignant bone pain  She have chronic bone pain which started with a diagnosis of multiple myeloma.  she is taking pain medicine as needed.  she  is currently doing water aerobics 3 times a week and is feeling much better. I continued to encourage her to participate with activity as tolerated.   No orders of the defined types were placed in this encounter.  All questions were answered. The patient knows to call the clinic with any problems, questions or concerns. No barriers to learning was detected. I spent 15 minutes counseling the patient face to face. The total time spent in the appointment was 20 minutes and more than 50% was on counseling and review of test results     [facility], Dominick Morella, MD 07/03/2016 4:07 PM

## 2016-07-03 NOTE — Telephone Encounter (Signed)
Gave pt cal & avs °

## 2016-07-03 NOTE — Assessment & Plan Note (Signed)
This is likely due to recent treatment. The patient denies recent history of fevers, cough, chills, diarrhea or dysuria. She is asymptomatic from the leukopenia. I will observe for now.  I will continue the chemotherapy at current dose without dosage adjustment.  If the leukopenia gets progressive worse in the future, I might have to delay her treatment or adjust the chemotherapy dose per protocol  

## 2016-07-03 NOTE — Assessment & Plan Note (Signed)
She have chronic bone pain which started with a diagnosis of multiple myeloma.  she is taking pain medicine as needed.  she is currently doing water aerobics 3 times a week and is feeling much better. I continued to encourage her to participate with activity as tolerated.

## 2016-07-03 NOTE — Assessment & Plan Note (Signed)
She is doing well clinically while on treatment. Continue Revlimid at current dose adjustment. 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

## 2016-07-09 ENCOUNTER — Other Ambulatory Visit: Payer: BLUE CROSS/BLUE SHIELD

## 2016-07-28 ENCOUNTER — Encounter: Payer: Self-pay | Admitting: Hematology and Oncology

## 2016-07-29 ENCOUNTER — Other Ambulatory Visit: Payer: Self-pay | Admitting: *Deleted

## 2016-07-29 DIAGNOSIS — C9001 Multiple myeloma in remission: Secondary | ICD-10-CM

## 2016-07-29 MED ORDER — LENALIDOMIDE 5 MG PO CAPS CTSU E1A11: ORAL_CAPSULE | ORAL | 0 refills | Status: DC

## 2016-07-30 ENCOUNTER — Other Ambulatory Visit: Payer: Self-pay | Admitting: *Deleted

## 2016-07-30 ENCOUNTER — Encounter: Payer: Self-pay | Admitting: *Deleted

## 2016-07-30 DIAGNOSIS — C9001 Multiple myeloma in remission: Secondary | ICD-10-CM

## 2016-07-30 NOTE — Telephone Encounter (Signed)
Refill on Revlimid could not be E-scribed to [facility] for some reason it would not escribe to them.  I faxed Rx to them at Fax [phone]

## 2016-07-31 ENCOUNTER — Telehealth: Payer: Self-pay

## 2016-07-31 ENCOUNTER — Other Ambulatory Visit (HOSPITAL_BASED_OUTPATIENT_CLINIC_OR_DEPARTMENT_OTHER): Payer: BLUE CROSS/BLUE SHIELD

## 2016-07-31 ENCOUNTER — Encounter: Payer: BLUE CROSS/BLUE SHIELD | Admitting: *Deleted

## 2016-07-31 ENCOUNTER — Telehealth: Payer: Self-pay | Admitting: *Deleted

## 2016-07-31 DIAGNOSIS — Z006 Encounter for examination for normal comparison and control in clinical research program: Secondary | ICD-10-CM

## 2016-07-31 DIAGNOSIS — C9001 Multiple myeloma in remission: Secondary | ICD-10-CM | POA: Diagnosis not present

## 2016-07-31 LAB — CBC WITH DIFFERENTIAL/PLATELET
BASO%: 0.3 % (ref 0.0–2.0)
Basophils Absolute: 0 10*3/uL (ref 0.0–0.1)
EOS%: 2.7 % (ref 0.0–7.0)
Eosinophils Absolute: 0.1 10*3/uL (ref 0.0–0.5)
HCT: 37.5 % (ref 34.8–46.6)
HGB: 11.7 g/dL (ref 11.6–15.9)
LYMPH%: 62.4 % — ABNORMAL HIGH (ref 14.0–49.7)
MCH: 23.2 pg — ABNORMAL LOW (ref 25.1–34.0)
MCHC: 31.2 g/dL — ABNORMAL LOW (ref 31.5–36.0)
MCV: 74.4 fL — ABNORMAL LOW (ref 79.5–101.0)
MONO#: 0.4 10*3/uL (ref 0.1–0.9)
MONO%: 11.4 % (ref 0.0–14.0)
NEUT#: 0.9 10*3/uL — ABNORMAL LOW (ref 1.5–6.5)
NEUT%: 23.2 % — ABNORMAL LOW (ref 38.4–76.8)
Platelets: 176 10*3/uL (ref 145–400)
RBC: 5.04 10*6/uL (ref 3.70–5.45)
RDW: 15.3 % — ABNORMAL HIGH (ref 11.2–14.5)
WBC: 3.7 10*3/uL — ABNORMAL LOW (ref 3.9–10.3)
lymph#: 2.3 10*3/uL (ref 0.9–3.3)

## 2016-07-31 MED ORDER — OXYCODONE HCL 15 MG PO TABS: 15.0000 mg | ORAL_TABLET | Freq: Three times a day (TID) | ORAL | 0 refills | Status: DC | PRN

## 2016-07-31 MED ORDER — OXYCODONE HCL 15 MG PO TABS
15.0000 mg | ORAL_TABLET | Freq: Three times a day (TID) | ORAL | 0 refills | Status: DC | PRN
Start: 2016-07-31 — End: 2016-07-31

## 2016-07-31 NOTE — Telephone Encounter (Signed)
Informed pt of Rx for oxycodone 15 mg ready to pick up and handicap parking form also ready to pick up.  Pt verbalized understanding and will be here in the morning.

## 2016-07-31 NOTE — Progress Notes (Addendum)
07/31/2016 Patient in to clinic today for evaluation prior to beginning Maintenance treatment cycle 23. Patient reports that Revlimid refill shipment is expected to arrive at 10am today. Confirmed continued dosing of 5mg  every other day, for Days 1-21 of the treatment cycle. Patient reports ongoing complaints as noted in AE table and list below. She does indicate an increased need for pain medication over the past couple of weeks, taking two pain pills some days when she would normally only take one. She reports more pain in her hips, particularly when walking through stores. Encouraged patient to contact Dr. Calton Dach nurse to notify her of these symptoms, to see if any changes in her pain medication could be made.   After leaving clinic, it was discovered that patient's Moody was 900, and therefore not high enough to begin treatment today. Patient was notified to hold Revlimid, and to return to clinic in one week on 08/07/16 for repeat CBC, per Dr. Alvy Bimler. Patient verbalized understanding. [name] BSN, RN, [place] 07/31/2016 9:50 AM  Adverse Event Log Study/Protocol: CTSU ECOG E1A11 Maintenance Cycle 22: 07/03/2016 - 07/30/2016  Event Grade Comments  Fatigue  Grade 2  Requiring additional rest on a few days  Sensory neuropathy  Grade 2 Moderate symptoms, unchanged from previous  Insomnia  Grade 2  Moderate difficulty, takes sleeping pill regularly.  Neutrophil count decreased Grade 3    Dyspnea Grade 1 Associated with fatigue  Pyogenic granuloma Grade 2 Removed by shave biopsy 07/09/2016   Non-reportable AEs (grade): - Moderate pain (2) o Joint pain (bilateral hip, knee, ankle) o Leg pain o Back pain o Bone pain o Generalized pain - Headaches, migraines - moderate (2) - Leukopenia (1)  [name] BSN, RN, [place] 07/31/2016 9:51 AM

## 2016-07-31 NOTE — Telephone Encounter (Signed)
Pt c/o back and hip pain.  She says the oxycodone 10 mg tablets do not take care of all her pain.  She has been taking one pill in the morning and one in the evening but says it doesn't work for her pain.  Informed pt she can take three times a day according to Rx.  Pt says she doesn't want to take it more often, she wants to know if she can go up to "the next level" and get a stronger RX for oxycodone.  Or is there something else that would be more effective?   Asked pt if she is willing to try two pills if Dr. Alvy Bimler recommends that?  Pt says she would rather have something "in between."   She thinks two 10 mg oxycodone will be too strong.   Pt also requests handicap parking placard.  Form left on Dr. Calton Dach desk for signature. Pt will pick up tomorrow.

## 2016-08-07 ENCOUNTER — Other Ambulatory Visit (HOSPITAL_BASED_OUTPATIENT_CLINIC_OR_DEPARTMENT_OTHER): Payer: BLUE CROSS/BLUE SHIELD

## 2016-08-07 ENCOUNTER — Telehealth: Payer: Self-pay | Admitting: *Deleted

## 2016-08-07 ENCOUNTER — Encounter: Payer: Self-pay | Admitting: Hematology and Oncology

## 2016-08-07 DIAGNOSIS — Z79899 Other long term (current) drug therapy: Secondary | ICD-10-CM | POA: Diagnosis not present

## 2016-08-07 DIAGNOSIS — C9001 Multiple myeloma in remission: Secondary | ICD-10-CM

## 2016-08-07 DIAGNOSIS — Z006 Encounter for examination for normal comparison and control in clinical research program: Secondary | ICD-10-CM

## 2016-08-07 LAB — CBC WITH DIFFERENTIAL/PLATELET
BASO%: 0.2 % (ref 0.0–2.0)
Basophils Absolute: 0 10*3/uL (ref 0.0–0.1)
EOS%: 1.7 % (ref 0.0–7.0)
Eosinophils Absolute: 0.1 10*3/uL (ref 0.0–0.5)
HCT: 38.3 % (ref 34.8–46.6)
HGB: 11.9 g/dL (ref 11.6–15.9)
LYMPH%: 49.8 % — ABNORMAL HIGH (ref 14.0–49.7)
MCH: 22.9 pg — ABNORMAL LOW (ref 25.1–34.0)
MCHC: 31 g/dL — ABNORMAL LOW (ref 31.5–36.0)
MCV: 74 fL — ABNORMAL LOW (ref 79.5–101.0)
MONO#: 0.4 10*3/uL (ref 0.1–0.9)
MONO%: 12.1 % (ref 0.0–14.0)
NEUT#: 1.1 10*3/uL — ABNORMAL LOW (ref 1.5–6.5)
NEUT%: 36.2 % — ABNORMAL LOW (ref 38.4–76.8)
Platelets: 222 10*3/uL (ref 145–400)
RBC: 5.18 10*6/uL (ref 3.70–5.45)
RDW: 15.3 % — ABNORMAL HIGH (ref 11.2–14.5)
WBC: 3 10*3/uL — ABNORMAL LOW (ref 3.9–10.3)
lymph#: 1.5 10*3/uL (ref 0.9–3.3)

## 2016-08-07 LAB — TSH: TSH: 1.108 m(IU)/L (ref 0.308–3.960)

## 2016-08-07 MED ORDER — LENALIDOMIDE 5 MG PO CAPS CTSU E1A11: ORAL_CAPSULE | ORAL | 0 refills | Status: DC

## 2016-08-07 NOTE — Telephone Encounter (Signed)
08/07/2016 Spoke with patient by phone regarding CBC results from today. ANC is now 1100 and meets requirements for continuing treatment. No dose modification indicated, since neutropenia was not sustained for more than 7 days. Patient does not report any new symptoms over the past week. Patient was given printed appointment calendar by Research Assistant Vernetta Honey at today's lab visit, with Cycle 23 treatment days marked. Patient to document doses for the upcoming cycle, beginning today. Also noted that subsequent appointments will need to be rescheduled, due to one week delay in start of treatment cycle 23. Patient voiced understanding, and states that she will look for appointment changes in MyChart, when they occur. [name] BSN, RN, [place] 08/07/2016 10:16 AM

## 2016-08-12 ENCOUNTER — Telehealth: Payer: Self-pay | Admitting: Hematology and Oncology

## 2016-08-12 NOTE — Telephone Encounter (Signed)
Spoke with pt to confirm r/s appts per los

## 2016-08-13 ENCOUNTER — Encounter: Payer: Self-pay | Admitting: Hematology and Oncology

## 2016-08-14 ENCOUNTER — Emergency Department (HOSPITAL_COMMUNITY): Payer: BLUE CROSS/BLUE SHIELD

## 2016-08-14 ENCOUNTER — Encounter: Payer: Self-pay | Admitting: Hematology and Oncology

## 2016-08-14 ENCOUNTER — Emergency Department (HOSPITAL_COMMUNITY)
Admission: EM | Admit: 2016-08-14 | Discharge: 2016-08-14 | Disposition: A | Discharge status: Home / Self Care | Payer: BLUE CROSS/BLUE SHIELD | Attending: Emergency Medicine | Admitting: Emergency Medicine

## 2016-08-14 ENCOUNTER — Telehealth: Payer: Self-pay | Admitting: *Deleted

## 2016-08-14 ENCOUNTER — Encounter (HOSPITAL_COMMUNITY): Payer: Self-pay | Admitting: Emergency Medicine

## 2016-08-14 ENCOUNTER — Other Ambulatory Visit: Payer: Self-pay

## 2016-08-14 DIAGNOSIS — Y929 Unspecified place or not applicable: Secondary | ICD-10-CM | POA: Diagnosis not present

## 2016-08-14 DIAGNOSIS — Z79899 Other long term (current) drug therapy: Secondary | ICD-10-CM | POA: Diagnosis not present

## 2016-08-14 DIAGNOSIS — E119 Type 2 diabetes mellitus without complications: Secondary | ICD-10-CM | POA: Insufficient documentation

## 2016-08-14 DIAGNOSIS — Y9311 Activity, swimming: Secondary | ICD-10-CM | POA: Diagnosis not present

## 2016-08-14 DIAGNOSIS — Y998 Other external cause status: Secondary | ICD-10-CM | POA: Diagnosis not present

## 2016-08-14 DIAGNOSIS — X503XXA Overexertion from repetitive movements, initial encounter: Secondary | ICD-10-CM | POA: Insufficient documentation

## 2016-08-14 DIAGNOSIS — S46912A Strain of unspecified muscle, fascia and tendon at shoulder and upper arm level, left arm, initial encounter: Secondary | ICD-10-CM

## 2016-08-14 DIAGNOSIS — S4992XA Unspecified injury of left shoulder and upper arm, initial encounter: Secondary | ICD-10-CM | POA: Diagnosis present

## 2016-08-14 LAB — CBC
HCT: 38 % (ref 36.0–46.0)
Hemoglobin: 12 g/dL (ref 12.0–15.0)
MCH: 22.9 pg — ABNORMAL LOW (ref 26.0–34.0)
MCHC: 31.6 g/dL (ref 30.0–36.0)
MCV: 72.7 fL — ABNORMAL LOW (ref 78.0–100.0)
Platelets: 267 10*3/uL (ref 150–400)
RBC: 5.23 MIL/uL — ABNORMAL HIGH (ref 3.87–5.11)
RDW: 15 % (ref 11.5–15.5)
WBC: 3.4 10*3/uL — ABNORMAL LOW (ref 4.0–10.5)

## 2016-08-14 LAB — BASIC METABOLIC PANEL
Anion gap: 7 (ref 5–15)
BUN: 10 mg/dL (ref 6–20)
CO2: 26 mmol/L (ref 22–32)
Calcium: 9.4 mg/dL (ref 8.9–10.3)
Chloride: 105 mmol/L (ref 101–111)
Creatinine, Ser: 0.84 mg/dL (ref 0.44–1.00)
GFR calc Af Amer: >60 mL/min (ref >60)
GFR calc non Af Amer: >60 mL/min (ref >60)
Glucose, Bld: 130 mg/dL — ABNORMAL HIGH (ref 65–99)
Potassium: 3.7 mmol/L (ref 3.5–5.1)
Sodium: 138 mmol/L (ref 135–145)

## 2016-08-14 LAB — I-STAT TROPONIN, ED: Troponin i, poc: 0 ng/mL (ref 0.00–0.08)

## 2016-08-14 MED ORDER — NAPROXEN 500 MG PO TABS: 500.0000 mg | ORAL_TABLET | Freq: Two times a day (BID) | ORAL | 0 refills | Status: AC

## 2016-08-14 NOTE — ED Triage Notes (Addendum)
Patient complaining of numbness to left shoulder and arm and aching in left leg since last night. Also reports pain on left side with inspiration. Denies dizziness, vision changes.

## 2016-08-14 NOTE — Telephone Encounter (Signed)
Called pt in response to Mychart email she sent c/o chest pain and left arm/ shoulder pain for the last 3 nights.  Pt reports intermittent sharp/ shooting chest pains w/ pain/ soreness under her left axilla.  Aching pain/ numbness down left arm.  States pain does not change w/ movement.  Happened past 3 nights while resting.  Took oxycodone last night and it "eased up" some.   Denies chest pains now.  Says left axilla is sore to touch.  Instructed pt per Dr. Alvy Bimler to either go to PCP or ED today to evaluate chest and left arm pain.  It could be muscular or a "pinched nerve" but she needs to make sure it is not Cardiac in nature.  If PCP cannot see her today then she should go to ED.  Pt verbalized understanding.

## 2016-08-14 NOTE — ED Provider Notes (Signed)
[facility] DEPT Provider Note   CSN: [id number] Arrival date & time: 08/14/16  1109     History   Chief Complaint Chief Complaint  Patient presents with  . Shoulder Pain    HPI Carla Perry is a 60 y.o. female.  History of multiple myeloma   The history is provided by the patient.  Shoulder Pain   This is a new problem. The current episode started yesterday (last night). The problem occurs constantly. The problem has not changed since onset.The pain is present in the left shoulder (left armpit). Quality: soreness. The pain is moderate. Treatments tried: oxycontin. The treatment provided no relief. There has been no history of extremity trauma.    Past Medical History:  Diagnosis Date  . Abnormal thyroid function test 08/30/2014  . Anemia   . Anemia, unspecified 10/06/2013  . Bone pain 10/21/2013  . Bronchitis 01/31/2015  . Diabetes mellitus without complication ([place]) 68/1275   Steroid induced diabetes. has not picked oral med up from pharmacy as of 09-14-14  . Diverticulitis 07/18/2014  . DVT (deep venous thrombosis) ([place]) 02/07/2014  . HBP (high blood pressure)   . Insomnia 02/11/2016  . Leukopenia due to antineoplastic chemotherapy 12/27/2013  . Memory loss   . MGUS (monoclonal gammopathy of unknown significance)   . MGUS (monoclonal gammopathy of unknown significance) 10/06/2013  . Migraine   . Multiple myeloma, without mention of having achieved remission 11/16/2013  . Pancytopenia ([place]) 12/27/2015  . Peripheral neuropathy ([place]) 12/27/2015  . Right leg swelling 02/07/2014  . Seizure ([place]) 1960   single seizure episode at age 41  . Thyroid disorder   . Vitamin D deficiency 10/24/2014    Patient Active Problem List   Diagnosis Date Noted  . Paresthesias 03/19/2016  . Headache 03/19/2016  . Insomnia 02/11/2016  . Peripheral neuropathy ([place]) 12/27/2015  . Pancytopenia ([place]) 12/27/2015  . Disability examination 03/22/2015  . Migraine without aura 02/16/2015  .  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 Bronchitis 01/31/2015  . Diarrhea 12/26/2014  . Malignant bone pain 12/26/2014  . Vitamin D deficiency 10/24/2014  . Abnormal thyroid function test 08/30/2014  . Fatigue 08/23/2014  . Generalized postprandial abdominal pain 08/22/2014  . Scleral hemorrhage of right eye 08/22/2014  . Hypokalemia 07/25/2014  . Diverticulitis 07/18/2014  . Hematochezia due to medication 05/23/2014  . Neck pain on right side 05/02/2014  . 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 Drug-induced pancytopenia ([place]) 05/02/2014  . Right leg swelling 02/07/2014  . DVT (deep venous thrombosis) ([place]) 02/07/2014  . Multiple myeloma in remission ([place]) 11/16/2013  . Bone pain 10/21/2013  . 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 Intractable chronic migraine without aura 06/28/2013    Past Surgical History:  Procedure Laterality Date  . HEMORRHOID SURGERY    . PORT-A-CATH REMOVAL  10-2014  . PORTACATH PLACEMENT Right jan 2015    OB History    No data available       Home Medications    Prior to Admission medications   Medication Sig Start Date End Date Taking? Authorizing Provider  carboxymethylcellulose (REFRESH PLUS) 0.5 % SOLN Place 1 drop into both eyes daily as needed (for dry eyes).    Yes Historical Provider, MD  Cholecalciferol (VITAMIN D3) 2000 UNITS TABS Take 2,000 Units by mouth daily.   Yes Historical Provider, MD  hydrochlorothiazide (HYDRODIURIL) 25 MG tablet Take 25 mg by mouth every evening.    Yes Historical Provider, MD  lenalidomide (REVLIMID) 5 MG capsule Take 1 capsule every other day for 21 days, off 7 days. Repeat every 28 days. 08/07/16  Yes Heath Lark, MD  levothyroxine (SYNTHROID, LEVOTHROID) 75 MCG tablet Take 75 mcg by mouth daily before breakfast.   Yes Historical Provider, MD  Multiple Vitamins-Minerals (CENTRUM SILVER PO) Take 1 tablet by mouth daily.    Yes Historical Provider, MD  ONGLYZA 5 MG TABS tablet  Take 5 mg by mouth daily.  01/29/15  Yes Historical Provider, MD  oxyCODONE (ROXICODONE) 15 MG immediate release tablet Take 1 tablet (15 mg total) by mouth 3 (three) times daily as needed for pain. 07/31/16  Yes Heath Lark, MD  Oxycodone HCl 10 MG TABS TAKE 1 TABLET THREE TIMES A DAY AS NEEDED FOR PAIN 07/04/16  Yes Historical Provider, MD  polyethylene glycol (MIRALAX / GLYCOLAX) packet Take 17 g by mouth daily as needed for mild constipation.    Yes Historical Provider, MD  RESTASIS 0.05 % ophthalmic emulsion PLACE 1 DROP IN BOTH EYES IN THE EVENING 07/28/16  Yes Historical Provider, MD  traMADol (ULTRAM) 50 MG tablet Take 1 tablet (50 mg total) by mouth every 6 (six) hours as needed for moderate pain. 10/05/15  Yes Heath Lark, MD  traZODone (DESYREL) 50 MG tablet Take 1 tablet (50 mg total) by mouth at bedtime. 02/11/16  Yes Heath Lark, MD  venlafaxine XR (EFFEXOR-XR) 150 MG 24 hr capsule Take 1 capsule (150 mg total) by mouth daily with breakfast. 03/20/16  Yes Melvenia Beam, MD  verapamil (VERELAN PM) 120 MG 24 hr capsule Take 1 capsule by mouth  daily 06/19/15  Yes Melvenia Beam, MD    Family History Family History  Problem Relation Age of Onset  . Throat cancer Father   . Stroke Father   . Cancer Brother     prostate    Social History Social History  Substance Use Topics  . Smoking status: Never Smoker  . Smokeless tobacco: Never Used  . Alcohol use No     Allergies   Review of patient's allergies indicates no known allergies.   Review of Systems Review of Systems  All other systems reviewed and are negative.    Physical Exam Updated Vital Signs BP 141/81 (BP Location: Left Arm)   Pulse 62   Temp 98.1 F (36.7 C) (Oral)   Resp 20   Ht 5' 7"  (1.702 m)   Wt 168 lb (76.2 kg)   SpO2 100%   BMI 26.31 kg/m   Physical Exam  Constitutional: She is oriented to person, place, and time. She appears well-developed and well-nourished. No distress.  HENT:  Head:  Normocephalic.  Eyes: Conjunctivae are normal.  Neck: Neck supple. No tracheal deviation present.  Cardiovascular: Normal rate and regular rhythm.   Pulmonary/Chest: Effort normal. No respiratory distress.  Abdominal: Soft. She exhibits no distension.  Musculoskeletal:       Left shoulder: She exhibits tenderness (posteriorly). She exhibits normal range of motion.  Symptoms reproducible with shoulder flexion from extension position.   Neurological: She is alert and oriented to person, place, and time.  Skin: Skin is warm and dry.  Psychiatric: She has a normal mood and affect.     ED Treatments / Results  Labs (all labs ordered are listed, but only abnormal results are displayed) Labs Reviewed  BASIC METABOLIC PANEL - Abnormal; Notable for the following:       Result Value   Glucose, Bld 130 (*)  All other components within normal limits  CBC - Abnormal; Notable for the following:    WBC 3.4 (*)    RBC 5.23 (*)    MCV 72.7 (*)    MCH 22.9 (*)    All other components within normal limits  I-STAT TROPOININ, ED    EKG  EKG Interpretation  Date/Time:  Thursday August 14 2016 11:44:20 EDT Ventricular Rate:  62 PR Interval:    QRS Duration: 90 QT Interval:  350 QTC Calculation: 356 R Axis:   -17 Text Interpretation:  Sinus rhythm RSR' in V1 or V2, right VCD or RVH Left ventricular hypertrophy ST elevation, consider inferior injury No STEMI. Compared to prior tracings.  Confirmed by LONG MD, JOSHUA [phone]) on 08/14/2016 11:48:40 AM Also confirmed by LONG MD, JOSHUA [phone]), editor [place] [facility], Leda Gauze [phone])  on 08/14/2016 1:50:16 PM       Radiology Dg Chest 2 View  Result Date: 08/14/2016 CLINICAL DATA:  Chest pain and left shoulder and arm. EXAM: CHEST  2 VIEW COMPARISON:  01/31/2015 FINDINGS: The heart size and mediastinal contours are within normal limits. Both lungs are clear. The visualized skeletal structures are unremarkable. IMPRESSION: Normal chest x-ray.  Electronically Signed   By: Marijo Sanes M.D.   On: 08/14/2016 13:33    Procedures Procedures (including critical care time)  Medications Ordered in ED Medications - No data to display   Initial Impression / Assessment and Plan / ED Course  I have reviewed the triage vital signs and the nursing notes.  Pertinent labs & imaging results that were available during my care of the patient were reviewed by me and considered in my medical decision making (see chart for details).  Clinical Course    59 y.o. female presents with left shoulder pain after swimming a mile of freestyle the day previous. Symptoms reproducible with swimming mechanics. No bony lesion noted on CXR. Patient was recommended to take short course of scheduled NSAIDs and engage in early mobility as definitive treatment.   Final Clinical Impressions(s) / ED Diagnoses   Final diagnoses:  Left shoulder strain, initial encounter    New Prescriptions Discharge Medication List as of 08/14/2016  5:30 PM    START taking these medications   Details  naproxen (NAPROSYN) 500 MG tablet Take 1 tablet (500 mg total) by mouth 2 (two) times daily with a meal., Starting Thu 08/14/2016, Until Tue 08/19/2016, Print         Leo Grosser, MD 08/14/16 2200

## 2016-08-23 ENCOUNTER — Other Ambulatory Visit: Payer: Self-pay | Admitting: Hematology and Oncology

## 2016-08-26 ENCOUNTER — Telehealth: Payer: Self-pay | Admitting: *Deleted

## 2016-08-26 ENCOUNTER — Ambulatory Visit (HOSPITAL_COMMUNITY)
Admission: RE | Admit: 2016-08-26 | Discharge: 2016-08-26 | Disposition: A | Discharge status: Home / Self Care | Payer: BLUE CROSS/BLUE SHIELD | Source: Ambulatory Visit | Attending: Nurse Practitioner | Admitting: Nurse Practitioner

## 2016-08-26 ENCOUNTER — Ambulatory Visit (HOSPITAL_BASED_OUTPATIENT_CLINIC_OR_DEPARTMENT_OTHER): Payer: BLUE CROSS/BLUE SHIELD | Admitting: Nurse Practitioner

## 2016-08-26 VITALS — BP 116/62 | HR 58 | Temp 98.4°F | Resp 18 | Ht 67.0 in | Wt 170.8 lb

## 2016-08-26 DIAGNOSIS — C9001 Multiple myeloma in remission: Secondary | ICD-10-CM

## 2016-08-26 DIAGNOSIS — R300 Dysuria: Secondary | ICD-10-CM

## 2016-08-26 DIAGNOSIS — M5441 Lumbago with sciatica, right side: Secondary | ICD-10-CM

## 2016-08-26 DIAGNOSIS — M545 Low back pain: Secondary | ICD-10-CM

## 2016-08-26 LAB — URINALYSIS, MICROSCOPIC - CHCC
Bilirubin (Urine): NEGATIVE
Blood: NEGATIVE
Glucose: NEGATIVE mg/dL
Ketones: NEGATIVE mg/dL
Leukocyte Esterase: NEGATIVE
Nitrite: NEGATIVE
Protein: NEGATIVE mg/dL
Specific Gravity, Urine: 1.02 (ref 1.003–1.035)
Urobilinogen, UR: 0.2 mg/dL (ref 0.2–1)
pH: 6 (ref 4.6–8.0)

## 2016-08-26 MED ORDER — PREDNISONE 20 MG PO TABS
20.0000 mg | ORAL_TABLET | Freq: Every day | ORAL | 0 refills | Status: DC
Start: 2016-08-26 — End: 2017-04-07

## 2016-08-26 NOTE — Telephone Encounter (Signed)
Pt left VM c/o "severe back pain" not relieved with oxycodone.  She asks if she can see Dr. Alvy Bimler today? Called pt back and she reports "really bad" pain across her lower back and tailbone which is worse w/ movement or trying to pick up anything.  She says she took 25 mg of oxycodone and it has not helped.  Discussed w/ Dr. Alvy Bimler and Selena Lesser.  Urgent scheduling message sent to add on for lab and [facility] today.   Informed pt and she will be here within 30 minutes.

## 2016-08-27 ENCOUNTER — Other Ambulatory Visit: Payer: Self-pay | Admitting: Nurse Practitioner

## 2016-08-27 ENCOUNTER — Telehealth: Payer: Self-pay

## 2016-08-27 ENCOUNTER — Telehealth: Payer: Self-pay | Admitting: Nurse Practitioner

## 2016-08-27 ENCOUNTER — Encounter: Payer: Self-pay | Admitting: Nurse Practitioner

## 2016-08-27 ENCOUNTER — Ambulatory Visit (HOSPITAL_COMMUNITY)
Admission: RE | Admit: 2016-08-27 | Discharge: 2016-08-27 | Disposition: A | Discharge status: Home / Self Care | Payer: BLUE CROSS/BLUE SHIELD | Source: Ambulatory Visit | Attending: Nurse Practitioner | Admitting: Nurse Practitioner

## 2016-08-27 DIAGNOSIS — M545 Low back pain, unspecified: Secondary | ICD-10-CM | POA: Insufficient documentation

## 2016-08-27 DIAGNOSIS — M5441 Lumbago with sciatica, right side: Secondary | ICD-10-CM

## 2016-08-27 DIAGNOSIS — C9001 Multiple myeloma in remission: Secondary | ICD-10-CM

## 2016-08-27 DIAGNOSIS — M1288 Other specific arthropathies, not elsewhere classified, other specified site: Secondary | ICD-10-CM | POA: Diagnosis not present

## 2016-08-27 MED ORDER — GADOBENATE DIMEGLUMINE 529 MG/ML IV SOLN
20.0000 mL | Freq: Once | INTRAVENOUS | Status: AC | PRN
  Administered 2016-08-27: 16 mL via INTRAVENOUS

## 2016-08-27 NOTE — Assessment & Plan Note (Signed)
Patient presented to the [facility] today with complaint of acute onset low back pain that radiates down her right leg.  She denies any known injury or trauma to the site.  She denies any UTI symptoms.  She denies any recent fevers or chills.  Patient also denies any urinary or bowel incontinence or other neurological issues.  Patient observed with decreased range of motion; and needed assistance walking due to the pain in her lower back.  There was no obvious tenderness with palpation.  Urinalysis obtained today was essentially normal; but awaiting culture results.  Plain film lumbar spine x-ray was also essentially normal as well.  Will obtain a lumbar MRI with and without contrast tomorrow morning for further evaluation.  Differential diagnosis includes progressive myeloma in the spine, muscle strain, or sciatica.  In the meantime-patient was advised to take her pain medication as directed.  She may increase her pain medication to double if needed.  She will also be prescribed prednisone 20 mg on a daily basis for the next few days to see if this helps as well.  Patient was also encouraged to go directly to the emergency department for any worsening symptoms whatsoever.

## 2016-08-27 NOTE — Assessment & Plan Note (Signed)
Patient continues to undergo clinical trial of Revlimid.  She presented to the [facility] today with complaint of low back pain.  See further notes for details.  She is scheduled for labs on 09/04/2016 and 09/25/2016.  She is scheduled for follow-up visit on 10/02/2016.

## 2016-08-27 NOTE — Telephone Encounter (Signed)
Faxed demographics, mri report and ov note from 9/26 to Dr Linden Dolin.

## 2016-08-27 NOTE — Telephone Encounter (Signed)
Reviewed lumbar MRI results with Dr. Alvy Bimler this morning; and then called patient to review the results as well.  This provider was able to obtain an initial consult with orthopedic surgeon, Dr. Gladstone Lighter at [facility] for this afternoon.  08/26/2016 at 3:45 PM for further evaluation and management.  Patient was advised that she would need to take her insurance card, photo ID, and her copayment for the visit.  Patient was also reminded that she should review with the orthopedist that she only has 3 more days of the prednisone to take on a daily basis as directed.  Will fax over the visit note from yesterday; as well as the lumbar MRI results to the [facility] orthopedic office.  Fax number is [phone].

## 2016-08-27 NOTE — Progress Notes (Signed)
SYMPTOM MANAGEMENT CLINIC    Chief Complaint: Lumbar pain  HPI:  Carla Perry 61 y.o. female diagnosed with multiple myeloma.  Currently undergoing clinical trial of Revlimid.   Oncology History   ISS stage 1 IgG lambda subtype (serum albumin 3.6, Beta2 microglobulin 2.32) Durie Salmon Stage 1     Multiple myeloma in remission ([place])   10/10/2013 Imaging    Skeletal survery was negative      11/09/2013 Bone Marrow Biopsy    BM biopsy confirmed myeloma, 76% involved, IgG lambda subtype      12/06/2013 - 08/29/2014 Chemotherapy    Sh received chemo with revlimid, Velcade, Dexamethasone and Zometa. 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      05/05/2014 Imaging    Imaging study of the neck showed no explanation that could cause right neck pain. She is noted to have incidental left upper lung nodule. Plan to repeat imaging study in 3 months.      09/06/2014 Imaging    Bone survey showed no evidence of fracture      09/14/2014 Bone Marrow Biopsy    Bone marrow biopsy showed 8% residual plasma cells by manual count but none on the biopsy specimen      09/26/2014 -  Chemotherapy    She is started on cycle 1 of maintenance Revlimid      01/31/2015 Imaging     chest x-ray showed pneumonia. Treatment was placed on hold.      05/03/2015 Bone Marrow Biopsy    Accession: [id number] repeat bone marrow aspirate and biopsy show 5% residual plasma cells       Review of Systems  Musculoskeletal: Positive for back pain.  All other systems reviewed and are negative.   Past Medical History:  Diagnosis Date  . Abnormal thyroid function test 08/30/2014  . Anemia   . Anemia, unspecified 10/06/2013  . Bone pain 10/21/2013  . Bronchitis 01/31/2015  . Diabetes mellitus without complication ([place])  44/0347   Steroid induced diabetes. has not picked oral med up from pharmacy as of 09-14-14  . Diverticulitis 07/18/2014  . DVT (deep venous thrombosis) ([place]) 02/07/2014  . HBP (high blood pressure)   . Insomnia 02/11/2016  . Leukopenia due to antineoplastic chemotherapy 12/27/2013  . Memory loss   . MGUS (monoclonal gammopathy of unknown significance)   . MGUS (monoclonal gammopathy of unknown significance) 10/06/2013  . Migraine   . Multiple myeloma, without mention of having achieved remission 11/16/2013  . Pancytopenia ([place]) 12/27/2015  . Peripheral neuropathy ([place]) 12/27/2015  . Right leg swelling 02/07/2014  . Seizure ([place]) 1960   single seizure episode at age 69  . Thyroid disorder   . Vitamin D deficiency 10/24/2014    Past Surgical History:  Procedure Laterality Date  . HEMORRHOID SURGERY    . PORT-A-CATH REMOVAL  10-2014  . 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    has No Known Allergies.    Medication List       Accurate as of 08/26/16 11:59 PM. Always use your most recent med list.          carboxymethylcellulose 0.5 % Soln Commonly known as:  REFRESH PLUS Place 1 drop into both eyes daily as needed (for dry eyes).   CENTRUM SILVER PO Take 1 tablet by mouth daily.   hydrochlorothiazide 25 MG tablet Commonly  known as:  HYDRODIURIL Take 25 mg by mouth every evening.   lenalidomide 5 MG capsule Commonly known as:  REVLIMID Take 1 capsule every other day for 21 days, off 7 days. Repeat every 28 days.   levothyroxine 75 MCG tablet Commonly known as:  SYNTHROID, LEVOTHROID Take 75 mcg by mouth daily before breakfast.   ONGLYZA 5 MG Tabs tablet Generic drug:  saxagliptin HCl Take 5 mg by mouth daily.   Oxycodone HCl 10 MG Tabs TAKE 1 TABLET THREE TIMES A DAY AS NEEDED FOR PAIN   oxyCODONE 15 MG immediate release tablet Commonly known as:  ROXICODONE Take 1 tablet (15 mg total) by mouth 3 (three) times daily as needed for pain.   polyethylene glycol packet Commonly known as:  MIRALAX / GLYCOLAX Take 17 g by mouth daily as needed for mild constipation.   predniSONE 20 MG tablet Commonly known as:  DELTASONE Take 1 tablet (20 mg total) by mouth daily.   RESTASIS 0.05 % ophthalmic emulsion Generic drug:  cycloSPORINE PLACE 1 DROP IN BOTH EYES IN THE EVENING   traMADol 50 MG tablet Commonly known as:  ULTRAM Take 1 tablet (50 mg total) by mouth every 6 (six) hours as needed for moderate pain.   traZODone 50 MG tablet Commonly known as:  DESYREL TAKE 1 TABLET (50 MG TOTAL) BY MOUTH AT BEDTIME.   venlafaxine XR 150 MG 24 hr capsule Commonly known as:  EFFEXOR-XR Take 1 capsule (150 mg total) by mouth daily with breakfast.   verapamil 120 MG 24 hr capsule Commonly known as:  VERELAN PM Take 1 capsule by mouth  daily   Vitamin D3 2000 units Tabs Take 2,000 Units by mouth daily.        PHYSICAL EXAMINATION  Oncology Vitals 08/26/2016 08/14/2016  Height 170 cm -  Weight 77.474 kg -  Weight (lbs) 170 lbs 13 oz -  BMI (kg/m2) 26.75 kg/m2 -  Temp 98.4 -  Pulse 58 56  Resp 18 18  SpO2 100 100  BSA (m2) 1.91 m2 -   BP Readings from Last 2 Encounters:  08/26/16 116/62  08/14/16 151/95    Physical Exam  Constitutional: She is oriented to person, place, and time and  well-developed, well-nourished, and in no distress.  HENT:  Head: Normocephalic and atraumatic.  Eyes: Conjunctivae and EOM are normal. Pupils are equal, round, and reactive to light.  Neck: Normal range of motion.  Pulmonary/Chest: She is in respiratory distress.  Musculoskeletal: She exhibits no edema or tenderness.  Decreased range of motion secondary to significant lower back pain.  Neurological: She is alert and oriented to person, place, and time.  Skin: Skin is warm and dry.  Psychiatric: Affect normal.  Nursing note and vitals reviewed.   LABORATORY DATA:. Office Visit on 08/26/2016  Component Date Value Ref Range Status  . Glucose 08/26/2016 Negative  Negative  mg/dL Final  . Bilirubin (Urine) 08/26/2016 Negative  Negative Final  . Ketones 08/26/2016 Negative  Negative mg/dL Final  . Specific Gravity, Urine 08/26/2016 1.020  1.003 - 1.035 Final  . Blood 08/26/2016 Negative  Negative Final  . pH 08/26/2016 6.0  4.6 - 8.0 Final  . Protein 08/26/2016 Negative  Negative- <30 mg/dL Final  . Urobilinogen, UR 08/26/2016 0.2  0.2 - 1 mg/dL Final  . Nitrite 08/26/2016 Negative  Negative Final  . Leukocyte Esterase 08/26/2016 Negative  Negative Final  . RBC / HPF 08/26/2016 0-2  0 - 2 Final  . WBC, UA 08/26/2016 3-6  0 - 2 Final  . Bacteria, UA 08/26/2016 Trace  Negative- Trace Final  . Epithelial Cells 08/26/2016 Occasional  Negative- Few Final    RADIOGRAPHIC STUDIES: Dg Lumbar Spine Complete  Result Date: 08/26/2016 CLINICAL DATA:  New onset of low back pain x 2 days without injury - pt being treated for multiple myeloma - pain across lower back with radiation of pain down right leg EXAM: LUMBAR SPINE - COMPLETE 4+ VIEW COMPARISON:  None. FINDINGS: Normal alignment of lumbar vertebral bodies. No loss of vertebral body height or disc height. No pars fracture. No subluxation. IMPRESSION: No acute osseous abnormality.  No evidence of myeloma or fracture. Electronically Signed   By:  Suzy Bouchard M.D.   On: 08/26/2016 16:19   Mr Lumbar Spine W Wo Contrast  Result Date: 08/27/2016 CLINICAL DATA:  New onset of low back pain for 3 days with no injury. History of multiple myeloma. Radiating right leg pain and left leg numbness. EXAM: MRI LUMBAR SPINE WITHOUT AND WITH CONTRAST TECHNIQUE: Multiplanar and multiecho pulse sequences of the lumbar spine were obtained without and with intravenous contrast. CONTRAST:  31m MULTIHANCE GADOBENATE DIMEGLUMINE 529 MG/ML IV SOLN COMPARISON:  None. FINDINGS: Segmentation: Standard when accounting for small ribs at the T12 level by chest x-ray. Alignment:  Physiologic. Vertebrae: No fracture, evidence of discitis, or bone lesion. There is a fatty intensity rounded structure in the right sacral ala with signal and morphology consistent with hemangioma. Mildly heterogeneous fatty signal also in the posterior ilium bilaterally. Conus medullaris: Extends to the L1-2 level and appears normal. Paraspinal and other soft tissues: Negative Disc levels: T12- L1: Unremarkable. L1-L2: Unremarkable. L2-L3: Unremarkable. L3-L4: Mild hyper trophic facet arthropathy with ligamentum flavum thickening. No impingement L4-L5: Facet arthropathy with marked ligamentum flavum thickening. Circumferential disc bulging. Overall moderate canal stenosis with notable bilateral subarticular recess effacement causing bilateral L5 impingement. Patent foramina. L5-S1:Disc narrowing mild bulging and ventral spurring. Mild facet spurring. No impingement. IMPRESSION: 1. No acute finding or evidence of myeloma. 2. Lumbar facet arthropathy that is focally advanced at L4-5 where there is bilateral L5 impingement in the subarticular recesses. Electronically Signed   By: JMonte FantasiaM.D.   On: 08/27/2016 07:48    ASSESSMENT/PLAN:    Multiple myeloma in remission ([facility] Patient continues to undergo clinical trial of Revlimid.  [facility] [place]  [facility].  See further notes for details.  She is scheduled for labs on 09/04/2016 and 09/25/2016.  She is scheduled for follow-up visit on 10/02/2016.  Lumbar pain [facility] of acute onset low back pain that radiates down her right leg.  She denies any known injury or trauma to the site.  She denies any UTI symptoms.  She denies any recent fevers or chills.  Patient also  denies any urinary or bowel incontinence or other neurological issues.  Patient observed with decreased range of motion; and needed assistance walking due to the pain in her lower back.  There was no obvious tenderness with palpation.  Urinalysis obtained today was essentially normal; but awaiting culture results.  Plain film lumbar spine x-ray was also essentially normal as well.  Will obtain a lumbar MRI with and without contrast tomorrow morning for further evaluation.  Differential diagnosis includes progressive myeloma in the spine, muscle strain, or sciatica.  In the meantime-patient was advised to take her pain medication as directed.  She may increase her pain medication to double if needed.  She will also be prescribed prednisone 20 mg on a daily basis for the next few days to see if this helps as well.  Patient was also encouraged to go directly to the emergency department for any worsening symptoms whatsoever.   Patient stated understanding of all instructions; and was in agreement with this plan of care. The patient knows to call the clinic with any problems, questions or concerns.   Total time spent with patient was 40 miinutes;  with greater than 75 percent of that time spent in face to face counseling regarding patient's symptoms,  and coordination of care and follow up.  Disclaimer:This dictation was prepared with Dragon/digital dictation along with [employer]. Any transcriptional errors that result from this process are unintentional.  Drue Second, NP 08/27/2016

## 2016-08-28 ENCOUNTER — Other Ambulatory Visit: Payer: BLUE CROSS/BLUE SHIELD

## 2016-08-28 ENCOUNTER — Other Ambulatory Visit: Payer: Self-pay | Admitting: *Deleted

## 2016-08-28 DIAGNOSIS — C9001 Multiple myeloma in remission: Secondary | ICD-10-CM

## 2016-08-28 LAB — URINE CULTURE: Organism ID, Bacteria: NO GROWTH

## 2016-08-28 MED ORDER — LENALIDOMIDE 5 MG PO CAPS CTSU E1A11: ORAL_CAPSULE | ORAL | 0 refills | Status: DC

## 2016-08-28 NOTE — Telephone Encounter (Signed)
Faxed Revlimid Refill to [facility].   For some reason Revlimid Rx cannot be escribed to [facility], so I faxed it.

## 2016-09-01 ENCOUNTER — Telehealth: Payer: Self-pay | Admitting: Hematology and Oncology

## 2016-09-01 NOTE — Telephone Encounter (Signed)
Ortho referral..............I spoke with Tameka at [facility] and per [place] patient was seen by Dr. Cay Schillings 9/27. No other appointment info per 9/25 los.

## 2016-09-03 ENCOUNTER — Encounter: Payer: Self-pay | Admitting: Hematology and Oncology

## 2016-09-03 ENCOUNTER — Telehealth: Payer: Self-pay | Admitting: *Deleted

## 2016-09-03 DIAGNOSIS — L980 Pyogenic granuloma: Secondary | ICD-10-CM | POA: Insufficient documentation

## 2016-09-03 NOTE — Telephone Encounter (Signed)
TC to patient to follow up after her orthopedic appt. She saw Dr. Linden Dolin and was prescribed muscle relaxant. She states it has not helped much but it going to give it more time.  She states she will call him back for another appt if no better in the next week or so.

## 2016-09-04 ENCOUNTER — Telehealth: Payer: Self-pay

## 2016-09-04 ENCOUNTER — Encounter: Payer: BLUE CROSS/BLUE SHIELD | Admitting: *Deleted

## 2016-09-04 ENCOUNTER — Ambulatory Visit (HOSPITAL_BASED_OUTPATIENT_CLINIC_OR_DEPARTMENT_OTHER): Payer: BLUE CROSS/BLUE SHIELD

## 2016-09-04 ENCOUNTER — Encounter: Payer: Self-pay | Admitting: *Deleted

## 2016-09-04 DIAGNOSIS — C9001 Multiple myeloma in remission: Secondary | ICD-10-CM

## 2016-09-04 DIAGNOSIS — Z006 Encounter for examination for normal comparison and control in clinical research program: Secondary | ICD-10-CM

## 2016-09-04 LAB — CBC WITH DIFFERENTIAL/PLATELET
BASO%: 0.4 % (ref 0.0–2.0)
Basophils Absolute: 0 10*3/uL (ref 0.0–0.1)
EOS%: 2.2 % (ref 0.0–7.0)
Eosinophils Absolute: 0.1 10*3/uL (ref 0.0–0.5)
HCT: 38.7 % (ref 34.8–46.6)
HGB: 11.9 g/dL (ref 11.6–15.9)
LYMPH%: 61.5 % — ABNORMAL HIGH (ref 14.0–49.7)
MCH: 22.9 pg — ABNORMAL LOW (ref 25.1–34.0)
MCHC: 30.8 g/dL — ABNORMAL LOW (ref 31.5–36.0)
MCV: 74.2 fL — ABNORMAL LOW (ref 79.5–101.0)
MONO#: 0.4 10*3/uL (ref 0.1–0.9)
MONO%: 10.4 % (ref 0.0–14.0)
NEUT#: 0.9 10*3/uL — ABNORMAL LOW (ref 1.5–6.5)
NEUT%: 25.5 % — ABNORMAL LOW (ref 38.4–76.8)
Platelets: 213 10*3/uL (ref 145–400)
RBC: 5.22 10*6/uL (ref 3.70–5.45)
RDW: 15.1 % — ABNORMAL HIGH (ref 11.2–14.5)
WBC: 3.7 10*3/uL — ABNORMAL LOW (ref 3.9–10.3)
lymph#: 2.3 10*3/uL (ref 0.9–3.3)

## 2016-09-04 NOTE — Progress Notes (Signed)
09/04/2016 Patient in to clinic today for evaluation prior to beginning Maintenance treatment cycle 24. Patient returned completed cycle 23 Medication Calendar, confirming correct dosing as prescribed (dose level -3, Revlimid 5mg  every other day, Days 1-21).   Patient's ANC was 900, and therefore not high enough to begin treatment today. Patient was notified to hold Revlimid, and she will return to clinic in one week on 09/11/16 for repeat CBC. Patient verbalized understanding. Discussed delay in bone marrow procedure, to be scheduled at the end of Cycle 24, and which will occur sometime in November. Patient states that she will need assistance to best coordinate this procedure, due to her husband's upcoming knee surgery scheduled for October 30th.   See separate Research Consent Form encounter for details regarding reconsent for protocol Addendum #9 and Addendum #10.  Patient reports ongoing complaints as noted in AE table and list below. She reports taking pain medication daily, using the increased dose. She has had an ED visit for left shoulder pain, felt to be due to muscle strain, followed by onset of severe back pain last week, for which she was seen by an orthopedic doctor.  Adverse Event Log Study/Protocol: [facility] ECOG E1A11 Maintenance Cycle 23: 08/07/2016 - 09/03/2016  Event Grade Comments  Fatigue  Grade 2  Moderate, occasionally limiting ADLs  Sensory neuropathy  Grade 2 Moderate symptoms, unchanged from previous  Insomnia  Grade 2  Moderate difficulty, takes sleeping pill regularly.  Neutrophil count decreased Grade 3     Dyspnea Grade 1 Associated with fatigue  Musculoskeletal, other  Grade 3 Left shoulder strain (ED 08/14/16)  Back pain Grade 3 Lumbar back pain    Non-reportable AEs (grade): - Moderate pain (2) ? Hip pain ? Leg pain ? Back pain ? Bone pain ? Generalized pain - Headaches, migraines - moderate (2) - Leukopenia (1)   [name] BSN, RN, [place] 09/04/2016 8:56  AM

## 2016-09-07 NOTE — Progress Notes (Deleted)
Adverse Event Log Study/Protocol: CTSU ECOG E1A11 Maintenance Cycle 22: 07/03/2016 - 07/30/2016  Event Grade Comments  Pyogenic granuloma Grade 2  Removed by shave biopsy 07/09/2016  [name] BSN, RN, [place] 09/07/2016 9:49 AM

## 2016-09-11 ENCOUNTER — Other Ambulatory Visit (HOSPITAL_BASED_OUTPATIENT_CLINIC_OR_DEPARTMENT_OTHER): Payer: BLUE CROSS/BLUE SHIELD

## 2016-09-11 ENCOUNTER — Encounter: Payer: Self-pay | Admitting: Medical Oncology

## 2016-09-11 ENCOUNTER — Encounter: Payer: Self-pay | Admitting: Hematology and Oncology

## 2016-09-11 DIAGNOSIS — C9001 Multiple myeloma in remission: Secondary | ICD-10-CM | POA: Diagnosis not present

## 2016-09-11 DIAGNOSIS — Z006 Encounter for examination for normal comparison and control in clinical research program: Secondary | ICD-10-CM

## 2016-09-11 LAB — CBC WITH DIFFERENTIAL/PLATELET
BASO%: 0.5 % (ref 0.0–2.0)
Basophils Absolute: 0 10*3/uL (ref 0.0–0.1)
EOS%: 1.5 % (ref 0.0–7.0)
Eosinophils Absolute: 0.1 10*3/uL (ref 0.0–0.5)
HCT: 36.8 % (ref 34.8–46.6)
HGB: 11.7 g/dL (ref 11.6–15.9)
LYMPH%: 43.9 % (ref 14.0–49.7)
MCH: 23.6 pg — ABNORMAL LOW (ref 25.1–34.0)
MCHC: 31.8 g/dL (ref 31.5–36.0)
MCV: 74.2 fL — ABNORMAL LOW (ref 79.5–101.0)
MONO#: 0.3 10*3/uL (ref 0.1–0.9)
MONO%: 8.6 % (ref 0.0–14.0)
NEUT#: 1.8 10*3/uL (ref 1.5–6.5)
NEUT%: 45.5 % (ref 38.4–76.8)
Platelets: 205 10*3/uL (ref 145–400)
RBC: 4.96 10*6/uL (ref 3.70–5.45)
RDW: 15.2 % — ABNORMAL HIGH (ref 11.2–14.5)
WBC: 3.9 10*3/uL (ref 3.9–10.3)
lymph#: 1.7 10*3/uL (ref 0.9–3.3)

## 2016-09-11 MED ORDER — LENALIDOMIDE 5 MG PO CAPS CTSU E1A11: ORAL_CAPSULE | ORAL | 0 refills | Status: DC

## 2016-09-11 NOTE — Progress Notes (Signed)
E7O35: Cycle 24 I met with patient this morning, in lobby, prior to her scheduled lab appointment. I met patient for research nurse, Tyrell Antonio, who is out of office today. Patient here this morning for CBC recheck prior to starting cycle 24. Patient was delayed last week due to grade 3 ANC. Today's ANC resulted within normal and treatable level of ANC @ 1.8. I reviewed with patient any new issues or concerns that she may have had since her visit last week, and patient denied any new symptoms or concerns. Patient does confirm back pain continues, no changes to severity. Patient was informed, by phone, of lab results, and informed to start with Revlimid as previously instructed, patient gave verbal understanding. While in clinic today, patient was provided with Medication Calendar for documentation of self administration of Revlimid, with treatment days marked for the days patient is to take medication. All patient's questions answered to her satisfaction, patient was encouraged to call clinic with any issues or concerns she may have.  Patient was also provided today with her copy of signed reconsent forms for protocol Addendum #9 and Addendum #10, for her records.  Adele Dan, RN, BSN Clinical Research 09/11/2016 8:45 AM

## 2016-09-16 ENCOUNTER — Other Ambulatory Visit: Payer: Self-pay | Admitting: Hematology and Oncology

## 2016-09-16 DIAGNOSIS — C9001 Multiple myeloma in remission: Secondary | ICD-10-CM

## 2016-09-18 ENCOUNTER — Other Ambulatory Visit: Payer: BLUE CROSS/BLUE SHIELD

## 2016-09-24 ENCOUNTER — Other Ambulatory Visit: Payer: Self-pay | Admitting: *Deleted

## 2016-09-24 ENCOUNTER — Telehealth: Payer: Self-pay

## 2016-09-24 DIAGNOSIS — C9001 Multiple myeloma in remission: Secondary | ICD-10-CM

## 2016-09-24 MED ORDER — LENALIDOMIDE 5 MG PO CAPS CTSU E1A11: ORAL_CAPSULE | ORAL | 0 refills | Status: DC

## 2016-09-24 NOTE — Telephone Encounter (Signed)
Spoke with patient and she is aware that she does not need to come in for labs on 10/26 as she will have labs drawn prior to her bone marrow bx at medical day.  We have also moved her Dr Alvy Bimler appointments and a new calendar has been mailed to her.   Webb Silversmith

## 2016-09-25 ENCOUNTER — Other Ambulatory Visit: Payer: BLUE CROSS/BLUE SHIELD

## 2016-09-25 ENCOUNTER — Other Ambulatory Visit: Payer: Self-pay | Admitting: *Deleted

## 2016-09-25 ENCOUNTER — Ambulatory Visit: Payer: BLUE CROSS/BLUE SHIELD | Admitting: Hematology and Oncology

## 2016-09-25 DIAGNOSIS — C9001 Multiple myeloma in remission: Secondary | ICD-10-CM

## 2016-10-02 ENCOUNTER — Ambulatory Visit: Payer: BLUE CROSS/BLUE SHIELD | Admitting: Hematology and Oncology

## 2016-10-03 ENCOUNTER — Other Ambulatory Visit: Payer: Self-pay | Admitting: Obstetrics & Gynecology

## 2016-10-03 ENCOUNTER — Other Ambulatory Visit (HOSPITAL_COMMUNITY)
Admission: RE | Admit: 2016-10-03 | Discharge: 2016-10-03 | Disposition: A | Discharge status: Home / Self Care | Payer: BLUE CROSS/BLUE SHIELD | Source: Ambulatory Visit | Attending: Obstetrics & Gynecology | Admitting: Obstetrics & Gynecology

## 2016-10-03 DIAGNOSIS — Z1151 Encounter for screening for human papillomavirus (HPV): Secondary | ICD-10-CM | POA: Insufficient documentation

## 2016-10-03 DIAGNOSIS — Z01419 Encounter for gynecological examination (general) (routine) without abnormal findings: Secondary | ICD-10-CM | POA: Insufficient documentation

## 2016-10-08 ENCOUNTER — Other Ambulatory Visit: Payer: Self-pay | Admitting: Hematology and Oncology

## 2016-10-08 DIAGNOSIS — C9001 Multiple myeloma in remission: Secondary | ICD-10-CM

## 2016-10-08 LAB — CYTOLOGY - PAP
Diagnosis: NEGATIVE
HPV: NOT DETECTED

## 2016-10-13 ENCOUNTER — Telehealth: Payer: Self-pay

## 2016-10-13 ENCOUNTER — Ambulatory Visit (HOSPITAL_COMMUNITY)
Admission: RE | Admit: 2016-10-13 | Discharge: 2016-10-13 | Disposition: A | Discharge status: Home / Self Care | Payer: BLUE CROSS/BLUE SHIELD | Source: Ambulatory Visit | Attending: Hematology and Oncology | Admitting: Hematology and Oncology

## 2016-10-13 DIAGNOSIS — C9001 Multiple myeloma in remission: Secondary | ICD-10-CM | POA: Insufficient documentation

## 2016-10-13 DIAGNOSIS — D72819 Decreased white blood cell count, unspecified: Secondary | ICD-10-CM | POA: Insufficient documentation

## 2016-10-13 DIAGNOSIS — D649 Anemia, unspecified: Secondary | ICD-10-CM | POA: Insufficient documentation

## 2016-10-13 NOTE — Telephone Encounter (Signed)
Confirmed with patient regarding NPO for bone marrow biopsy 10/14/2016.

## 2016-10-14 ENCOUNTER — Ambulatory Visit: Payer: BLUE CROSS/BLUE SHIELD | Admitting: Hematology and Oncology

## 2016-10-14 ENCOUNTER — Encounter (HOSPITAL_COMMUNITY): Payer: Self-pay

## 2016-10-14 ENCOUNTER — Ambulatory Visit (HOSPITAL_BASED_OUTPATIENT_CLINIC_OR_DEPARTMENT_OTHER)
Admission: RE | Admit: 2016-10-14 | Discharge: 2016-10-14 | Disposition: A | Discharge status: Home / Self Care | Payer: BLUE CROSS/BLUE SHIELD | Source: Ambulatory Visit | Attending: Hematology and Oncology | Admitting: Hematology and Oncology

## 2016-10-14 ENCOUNTER — Other Ambulatory Visit: Payer: Self-pay | Admitting: *Deleted

## 2016-10-14 DIAGNOSIS — Z006 Encounter for examination for normal comparison and control in clinical research program: Secondary | ICD-10-CM | POA: Diagnosis not present

## 2016-10-14 DIAGNOSIS — C9001 Multiple myeloma in remission: Secondary | ICD-10-CM

## 2016-10-14 LAB — COMPREHENSIVE METABOLIC PANEL
ALT: 23 U/L (ref 14–54)
AST: 26 U/L (ref 15–41)
Albumin: 3.7 g/dL (ref 3.5–5.0)
Alkaline Phosphatase: 46 U/L (ref 38–126)
Anion gap: 7 (ref 5–15)
BUN: 15 mg/dL (ref 6–20)
CO2: 27 mmol/L (ref 22–32)
Calcium: 9 mg/dL (ref 8.9–10.3)
Chloride: 105 mmol/L (ref 101–111)
Creatinine, Ser: 0.92 mg/dL (ref 0.44–1.00)
GFR calc Af Amer: >60 mL/min (ref >60)
GFR calc non Af Amer: >60 mL/min (ref >60)
Glucose, Bld: 94 mg/dL (ref 65–99)
Potassium: 3.7 mmol/L (ref 3.5–5.1)
Sodium: 139 mmol/L (ref 135–145)
Total Bilirubin: 0.7 mg/dL (ref 0.3–1.2)
Total Protein: 7.1 g/dL (ref 6.5–8.1)

## 2016-10-14 LAB — CBC WITH DIFFERENTIAL/PLATELET
Basophils Absolute: 0 10*3/uL (ref 0.0–0.1)
Basophils Relative: 0 %
Eosinophils Absolute: 0.1 10*3/uL (ref 0.0–0.7)
Eosinophils Relative: 2 %
HCT: 36.9 % (ref 36.0–46.0)
Hemoglobin: 11.7 g/dL — ABNORMAL LOW (ref 12.0–15.0)
Lymphocytes Relative: 58 %
Lymphs Abs: 1.8 10*3/uL (ref 0.7–4.0)
MCH: 23.4 pg — ABNORMAL LOW (ref 26.0–34.0)
MCHC: 31.7 g/dL (ref 30.0–36.0)
MCV: 73.9 fL — ABNORMAL LOW (ref 78.0–100.0)
Monocytes Absolute: 0.4 10*3/uL (ref 0.1–1.0)
Monocytes Relative: 11 %
Neutro Abs: 1 10*3/uL — ABNORMAL LOW (ref 1.7–7.7)
Neutrophils Relative %: 29 %
Platelets: 239 10*3/uL (ref 150–400)
RBC: 4.99 MIL/uL (ref 3.87–5.11)
RDW: 15 % (ref 11.5–15.5)
WBC: 3.3 10*3/uL — ABNORMAL LOW (ref 4.0–10.5)

## 2016-10-14 LAB — BONE MARROW EXAM

## 2016-10-14 LAB — LACTATE DEHYDROGENASE: LDH: 165 U/L (ref 98–192)

## 2016-10-14 LAB — RESEARCH LABS

## 2016-10-14 MED ORDER — SODIUM CHLORIDE 0.9 % IV SOLN
Freq: Once | INTRAVENOUS | Status: AC
  Administered 2016-10-14: 07:00:00 via INTRAVENOUS

## 2016-10-14 MED ORDER — MIDAZOLAM HCL 10 MG/2ML IJ SOLN: 10.0000 mg | Freq: Once | INTRAMUSCULAR | Status: DC

## 2016-10-14 MED ORDER — FENTANYL CITRATE (PF) 100 MCG/2ML IJ SOLN
100.0000 ug | Freq: Once | INTRAMUSCULAR | Status: DC
  Filled 2016-10-14: qty 2

## 2016-10-14 MED ORDER — MIDAZOLAM HCL 5 MG/ML IJ SOLN
10.0000 mg | Freq: Once | INTRAMUSCULAR | Status: DC
  Filled 2016-10-14: qty 2

## 2016-10-14 MED ORDER — FENTANYL CITRATE (PF) 100 MCG/2ML IJ SOLN
INTRAMUSCULAR | Status: AC | PRN
  Administered 2016-10-14 (×2): 25 ug via INTRAVENOUS

## 2016-10-14 MED ORDER — MIDAZOLAM HCL 2 MG/2ML IJ SOLN
INTRAMUSCULAR | Status: AC | PRN
  Administered 2016-10-14: 1 mg via INTRAVENOUS
  Administered 2016-10-14 (×2): 2 mg via INTRAVENOUS

## 2016-10-14 NOTE — Discharge Instructions (Signed)
Do not drive  For 24 hours Do not go into public places today May resume your regular diet and take home medications as usual May experience small amount of tingling in leg (biopsy side) May take shower and remove bandage in am For any questions or concerns, call Dr Alvy Bimler If bleeding occurs at site, hold pressure x10 minutes  If continues, call Dr Alvy Bimler  [phone]  Bone Marrow Aspiration and Bone Marrow Biopsy, Adult, Care After This sheet gives you information about how to care for yourself after your procedure. Your health care provider may also give you more specific instructions. If you have problems or questions, contact your health care provider. What can I expect after the procedure? After the procedure, it is common to have:  Mild pain and tenderness.  Swelling.  Bruising. Follow these instructions at home:  Take over-the-counter or prescription medicines only as told by your health care provider.  Do not take baths, swim, or use a hot tub until your health care provider approves. Ask if you can take a shower or have a sponge bath.  Follow instructions from your health care provider about how to take care of the puncture site. Make sure you:  Wash your hands with soap and water before you change your bandage (dressing). If soap and water are not available, use hand sanitizer.  Change your dressing as told by your health care provider.  Check your puncture siteevery day for signs of infection. Check for:  More redness, swelling, or pain.  More fluid or blood.  Warmth.  Pus or a bad smell.  Return to your normal activities as told by your health care provider. Ask your health care provider what activities are safe for you.  Do not drive for 24 hours if you were given a medicine to help you relax (sedative).  Keep all follow-up visits as told by your health care provider. This is important. Contact a health care provider if:  You have more redness,  swelling, or pain around the puncture site.  You have more fluid or blood coming from the puncture site.  Your puncture site feels warm to the touch.  You have pus or a bad smell coming from the puncture site.  You have a fever.  Your pain is not controlled with medicine. This information is not intended to replace advice given to you by your health care provider. Make sure you discuss any questions you have with your health care provider. Document Released: 06/06/2005 Document Revised: 06/06/2016 Document Reviewed: 04/30/2016 Elsevier Interactive Patient Education  2017 [place].  Moderate Conscious Sedation, Adult, Care After These instructions provide you with information about caring for yourself after your procedure. Your health care provider may also give you more specific instructions. Your treatment has been planned according to current medical practices, but problems sometimes occur. Call your health care provider if you have any problems or questions after your procedure. What can I expect after the procedure? After your procedure, it is common:  To feel sleepy for several hours.  To feel clumsy and have poor balance for several hours.  To have poor judgment for several hours.  To vomit if you eat too soon. Follow these instructions at home: For at least 24 hours after the procedure:   Do not:  Participate in activities where you could fall or become injured.  Drive.  Use heavy machinery.  Drink alcohol.  Take sleeping pills or medicines that cause drowsiness.  Make important  decisions or sign legal documents.  Take care of children on your own.  Rest. Eating and drinking  Follow the diet recommended by your health care provider.  If you vomit:  Drink water, juice, or soup when you can drink without vomiting.  Make sure you have little or no nausea before eating solid foods. General instructions  Have a responsible adult stay with you until you  are awake and alert.  Take over-the-counter and prescription medicines only as told by your health care provider.  If you smoke, do not smoke without supervision.  Keep all follow-up visits as told by your health care provider. This is important. Contact a health care provider if:  You keep feeling nauseous or you keep vomiting.  You feel light-headed.  You develop a rash.  You have a fever. Get help right away if:  You have trouble breathing. This information is not intended to replace advice given to you by your health care provider. Make sure you discuss any questions you have with your health care provider. Document Released: 09/07/2013 Document Revised: 04/21/2016 Document Reviewed: 03/08/2016 Elsevier Interactive Patient Education  2017 [employer].

## 2016-10-14 NOTE — Sedation Documentation (Signed)
Patient denies pain and is resting comfortably.  

## 2016-10-14 NOTE — Sedation Documentation (Signed)
Patient is resting comfortably. 

## 2016-10-14 NOTE — Sedation Documentation (Signed)
Medication dose calculated and verified for: Fentanyl 50 mcg IV and Versed 5 mg IV 

## 2016-10-14 NOTE — Sedation Documentation (Signed)
Vital signs stable. 

## 2016-10-14 NOTE — Sedation Documentation (Signed)
Procedure ends. Bandaid placed on post iliac area. Pt placed supine.Friend was updated as to status

## 2016-10-14 NOTE — Procedures (Signed)
Brief examination was performed. ENT: adequate airway clearance Heart: regular rate and rhythm.No Murmurs Lungs: clear to auscultation, no wheezes, normal respiratory effort  American Society of Anesthesiologists ASA scale 1  Mallampati Score of 2  Bone Marrow Biopsy and Aspiration Procedure Note   Informed consent was obtained and potential risks including bleeding, infection and pain were reviewed with the patient. I verified that the patient has been fasting since midnight.  The patient's name, date of birth, identification, consent and allergies were verified prior to the start of procedure and time out was performed.  A total of 5 mg of IV Versed and 50 mcg of IV fentanyl were given.  The right posterior iliac crest was chosen as the site of biopsy.  The skin was prepped with Betadine solution.   8 cc of 1% lidocaine was used to provide local anaesthesia.   A total of 15 cc of bone marrow aspirate was obtained followed by 2 separate biopsies are obtained for diagnostic and research purposes.   The procedure was tolerated well and there were no complications.  The patient was stable at the end of the procedure.  Specimens sent for flow cytometry, cytogenetics and additional studies.

## 2016-10-14 NOTE — Sedation Documentation (Signed)
bandaid right post iliac area CDI

## 2016-10-15 ENCOUNTER — Other Ambulatory Visit: Payer: Self-pay | Admitting: Obstetrics & Gynecology

## 2016-10-15 DIAGNOSIS — Z1231 Encounter for screening mammogram for malignant neoplasm of breast: Secondary | ICD-10-CM

## 2016-10-15 LAB — KAPPA/LAMBDA LIGHT CHAINS
Kappa free light chain: 15.5 mg/L (ref 3.3–19.4)
Kappa, lambda light chain ratio: 1.04 (ref 0.26–1.65)
Lambda free light chains: 14.9 mg/L (ref 5.7–26.3)

## 2016-10-16 ENCOUNTER — Encounter: Payer: Self-pay | Admitting: *Deleted

## 2016-10-16 LAB — MULTIPLE MYELOMA PANEL, SERUM
Albumin SerPl Elph-Mcnc: 3.4 g/dL (ref 2.9–4.4)
Albumin/Glob SerPl: 1.1 (ref 0.7–1.7)
Alpha 1: 0.2 g/dL (ref 0.0–0.4)
Alpha2 Glob SerPl Elph-Mcnc: 0.5 g/dL (ref 0.4–1.0)
B-Globulin SerPl Elph-Mcnc: 1 g/dL (ref 0.7–1.3)
Gamma Glob SerPl Elph-Mcnc: 1.6 g/dL (ref 0.4–1.8)
Globulin, Total: 3.3 g/dL (ref 2.2–3.9)
IgA: 135 mg/dL (ref 87–352)
IgG (Immunoglobin G), Serum: 1645 mg/dL — ABNORMAL HIGH (ref 700–1600)
IgM, Serum: 61 mg/dL (ref 26–217)
Total Protein ELP: 6.7 g/dL (ref 6.0–8.5)

## 2016-10-20 ENCOUNTER — Other Ambulatory Visit: Payer: Self-pay | Admitting: *Deleted

## 2016-10-20 DIAGNOSIS — C9001 Multiple myeloma in remission: Secondary | ICD-10-CM

## 2016-10-21 ENCOUNTER — Encounter: Payer: BLUE CROSS/BLUE SHIELD | Admitting: *Deleted

## 2016-10-21 ENCOUNTER — Other Ambulatory Visit (HOSPITAL_BASED_OUTPATIENT_CLINIC_OR_DEPARTMENT_OTHER): Payer: BLUE CROSS/BLUE SHIELD

## 2016-10-21 ENCOUNTER — Ambulatory Visit (HOSPITAL_BASED_OUTPATIENT_CLINIC_OR_DEPARTMENT_OTHER): Payer: BLUE CROSS/BLUE SHIELD | Admitting: Hematology and Oncology

## 2016-10-21 ENCOUNTER — Encounter: Payer: Self-pay | Admitting: Hematology and Oncology

## 2016-10-21 DIAGNOSIS — Z79899 Other long term (current) drug therapy: Secondary | ICD-10-CM | POA: Diagnosis not present

## 2016-10-21 DIAGNOSIS — D61811 Other drug-induced pancytopenia: Secondary | ICD-10-CM | POA: Diagnosis not present

## 2016-10-21 DIAGNOSIS — Z006 Encounter for examination for normal comparison and control in clinical research program: Secondary | ICD-10-CM | POA: Diagnosis not present

## 2016-10-21 DIAGNOSIS — C9001 Multiple myeloma in remission: Secondary | ICD-10-CM

## 2016-10-21 DIAGNOSIS — M898X9 Other specified disorders of bone, unspecified site: Secondary | ICD-10-CM | POA: Diagnosis not present

## 2016-10-21 LAB — CBC WITH DIFFERENTIAL/PLATELET
BASO%: 0.3 % (ref 0.0–2.0)
Basophils Absolute: 0 10*3/uL (ref 0.0–0.1)
EOS%: 2.1 % (ref 0.0–7.0)
Eosinophils Absolute: 0.1 10*3/uL (ref 0.0–0.5)
HCT: 37.2 % (ref 34.8–46.6)
HGB: 11.4 g/dL — ABNORMAL LOW (ref 11.6–15.9)
LYMPH%: 51.1 % — ABNORMAL HIGH (ref 14.0–49.7)
MCH: 22.8 pg — ABNORMAL LOW (ref 25.1–34.0)
MCHC: 30.6 g/dL — ABNORMAL LOW (ref 31.5–36.0)
MCV: 74.5 fL — ABNORMAL LOW (ref 79.5–101.0)
MONO#: 0.3 10*3/uL (ref 0.1–0.9)
MONO%: 10 % (ref 0.0–14.0)
NEUT#: 1.2 10*3/uL — ABNORMAL LOW (ref 1.5–6.5)
NEUT%: 36.5 % — ABNORMAL LOW (ref 38.4–76.8)
Platelets: 219 10*3/uL (ref 145–400)
RBC: 5 10*6/uL (ref 3.70–5.45)
RDW: 14.9 % — ABNORMAL HIGH (ref 11.2–14.5)
WBC: 3.2 10*3/uL — ABNORMAL LOW (ref 3.9–10.3)
lymph#: 1.6 10*3/uL (ref 0.9–3.3)

## 2016-10-21 LAB — TSH: TSH: 2.102 m(IU)/L (ref 0.308–3.960)

## 2016-10-21 MED ORDER — LENALIDOMIDE 5 MG PO CAPS CTSU E1A11: ORAL_CAPSULE | ORAL | 0 refills | Status: DC

## 2016-10-21 MED ORDER — OXYCODONE HCL 15 MG PO TABS: 15.0000 mg | ORAL_TABLET | Freq: Three times a day (TID) | ORAL | 0 refills | Status: DC | PRN

## 2016-10-21 NOTE — Assessment & Plan Note (Signed)
I reviewed test results with her at her husband. So far, her bone marrow biopsy and blood work confirmed complete remission. We are waiting for 24-hour urine collection and further testing. We discussed further care. The patient is comfortable to remain on Revlimid indefinitely. She will continue calcium with vitamin D supplement along with aspirin for DVT prophylaxis. 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 I will see her back in 3 months for further review.

## 2016-10-21 NOTE — Assessment & Plan Note (Signed)
This is likely due to recent treatment. The patient denies recent history of fevers, cough, chills, diarrhea or dysuria. She is asymptomatic from the leukopenia. I will observe for now.  I will continue the chemotherapy at current dose without dosage adjustment.  If the leukopenia gets progressive worse in the future, I might have to delay her treatment or adjust the chemotherapy dose per protocol  

## 2016-10-21 NOTE — Progress Notes (Signed)
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 Patient particpated in clinical research [facility] [id number]      02/23/2014 Bone Marrow Biopsy    Repeat bone marrow biopsy showed 5% involvement      03/31/2014 Adverse Reaction    Zometa was discontinued due to osteonecrosis of the jaw.      05/05/2014 Imaging    Imaging study of the neck showed no explanation that could cause right neck pain. She is noted to have incidental left upper lung nodule. Plan to repeat imaging study in 3 months.      09/06/2014 Imaging    Bone survey showed no evidence of fracture      09/14/2014 Bone Marrow Biopsy    Bone marrow biopsy showed 8% residual plasma cells by manual count but none on the biopsy specimen      09/26/2014 -  Chemotherapy    She is started on cycle 1 of maintenance Revlimid      01/31/2015 Imaging     chest x-ray showed pneumonia. Treatment was placed on hold.      05/03/2015 Bone Marrow Biopsy    Accession: [id number] repeat bone marrow aspirate and biopsy show 5% residual plasma cells      10/14/2016 Bone Marrow Biopsy    Bone marrow biopsy showed the plasma cells represent 4% of all cells with lack of large aggregates or sheets. To assess the plasma cell clonality, immunohistochemical stains is performed and it lack clonality        INTERVAL HISTORY: Please see below for problem oriented charting. She returns to review test results. She continues to have chronic bone pain, averaging 2 pills of narcotic prescription per day. She is able to exercise somewhat. Denies recent infection. The patient denies any recent signs or symptoms of bleeding such as spontaneous epistaxis, hematuria or hematochezia. She continues to have mild diarrhea, stable.  REVIEW OF SYSTEMS:   Constitutional: Denies fevers, chills or abnormal weight loss Eyes: Denies blurriness of vision Ears, nose, mouth, throat, and face: Denies mucositis or sore throat Respiratory: Denies cough, dyspnea or wheezes Cardiovascular: Denies palpitation, chest discomfort or lower extremity swelling Gastrointestinal:  Denies nausea, heartburn or change in bowel habits Skin: Denies abnormal skin rashes Lymphatics: Denies new lymphadenopathy or easy bruising Neurological:Denies numbness, tingling or new weaknesses Behavioral/Psych: Mood is stable, no new changes  All other systems were reviewed with the patient and are negative.  I have reviewed the past medical history, past surgical history, social history and family history with the patient and they are unchanged from previous note.  ALLERGIES:  has No Known Allergies.  MEDICATIONS:  Current Outpatient Prescriptions  Medication Sig Dispense Refill  . carboxymethylcellulose (REFRESH PLUS) 0.5 % SOLN Place 1 drop into both eyes daily as needed (for dry eyes).     Marland Kitchen  Cholecalciferol (VITAMIN D3) 2000 UNITS TABS Take 2,000 Units by mouth daily.    . hydrochlorothiazide (HYDRODIURIL) 25 MG tablet Take 25 mg by mouth every evening.     Marland Kitchen lenalidomide (REVLIMID) 5 MG capsule Take 1 capsule every other day for 21 days, off 7 days. Repeat every 28 days. 11 capsule 0  . levothyroxine (SYNTHROID, LEVOTHROID) 75 MCG tablet Take 75 mcg by mouth daily before breakfast.    . Multiple Vitamins-Minerals (CENTRUM SILVER  PO) Take 1 tablet by mouth daily.     . ONGLYZA 5 MG TABS tablet Take 5 mg by mouth daily.     Marland Kitchen oxyCODONE (ROXICODONE) 15 MG immediate release tablet Take 1 tablet (15 mg total) by mouth 3 (three) times daily as needed for pain. 90 tablet 0  . polyethylene glycol (MIRALAX / GLYCOLAX) packet Take 17 g by mouth daily as needed for mild constipation.     . RESTASIS 0.05 % ophthalmic emulsion PLACE 1 DROP IN BOTH EYES IN THE EVENING  4  . traMADol (ULTRAM) 50 MG tablet Take 1 tablet (50 mg total) by mouth every 6 (six) hours as needed for moderate pain. 90 tablet 0  . traZODone (DESYREL) 50 MG tablet TAKE 1 TABLET (50 MG TOTAL) BY MOUTH AT BEDTIME. 30 tablet 6  . venlafaxine XR (EFFEXOR-XR) 150 MG 24 hr capsule Take 1 capsule (150 mg total) by mouth daily with breakfast. 30 capsule 12  . verapamil (VERELAN PM) 120 MG 24 hr capsule Take 1 capsule by mouth  daily 30 capsule 11  . predniSONE (DELTASONE) 20 MG tablet Take 1 tablet (20 mg total) by mouth daily. (Patient not taking: Reported on 10/21/2016) 5 tablet 0   No current facility-administered medications for this visit.    Facility-Administered Medications Ordered in Other Visits  Medication Dose Route Frequency Provider Last Rate Last Dose  . gadopentetate dimeglumine (MAGNEVIST) injection 15 mL  15 mL Intravenous Once PRN Melvenia Beam, MD        PHYSICAL EXAMINATION: ECOG PERFORMANCE STATUS: 1 - Symptomatic but completely ambulatory  Vitals:   10/21/16 0915  BP: 134/85  Pulse: 61  Resp: 18  Temp: 98.2 F (36.8 C)   Filed Weights   10/21/16 0915  Weight: 171 lb (77.6 kg)    GENERAL:alert, no distress and comfortable SKIN: skin color, texture, turgor are normal, no rashes or significant lesions EYES: normal, Conjunctiva are pink and non-injected, sclera clear OROPHARYNX:no exudate, no erythema and lips, buccal mucosa, and tongue normal  NECK: supple, thyroid normal size, non-tender, without nodularity LYMPH:  no palpable  lymphadenopathy in the cervical, axillary or inguinal LUNGS: clear to auscultation and percussion with normal breathing effort HEART: regular rate & rhythm and no murmurs and no lower extremity edema ABDOMEN:abdomen soft, non-tender and normal bowel sounds Musculoskeletal:no cyanosis of digits and no clubbing  NEURO: alert & oriented x 3 with fluent speech, no focal motor/sensory deficits  LABORATORY DATA:  I have reviewed the data as listed    Component Value Date/Time   NA 139 10/14/2016 0835   NA 140 06/26/2016 0741   K 3.7 10/14/2016 0835   K 3.5 06/26/2016 0741   CL 105 10/14/2016 0835   CO2 27 10/14/2016 0835   CO2 26 06/26/2016 0741   GLUCOSE 94 10/14/2016 0835   GLUCOSE 90 06/26/2016 0741   BUN 15 10/14/2016 0835   BUN 10.2 06/26/2016 0741   CREATININE 0.92 10/14/2016 0835   CREATININE 0.9 06/26/2016 0741  CALCIUM 9.0 10/14/2016 0835   CALCIUM 9.4 06/26/2016 0741   PROT 7.1 10/14/2016 0835   PROT 8.0 06/26/2016 0741   ALBUMIN 3.7 10/14/2016 0835   ALBUMIN 3.7 06/26/2016 0741   AST 26 10/14/2016 0835   AST 23 06/26/2016 0741   ALT 23 10/14/2016 0835   ALT 30 06/26/2016 0741   ALKPHOS 46 10/14/2016 0835   ALKPHOS 62 06/26/2016 0741   BILITOT 0.7 10/14/2016 0835   BILITOT 0.69 06/26/2016 0741   GFRNONAA >60 10/14/2016 0835   GFRAA >60 10/14/2016 0835    No results found for: SPEP, UPEP  Lab Results  Component Value Date   WBC 3.2 (L) 10/21/2016   NEUTROABS 1.2 (L) 10/21/2016   HGB 11.4 (L) 10/21/2016   HCT 37.2 10/21/2016   MCV 74.5 (L) 10/21/2016   PLT 219 10/21/2016      Chemistry      Component Value Date/Time   NA 139 10/14/2016 0835   NA 140 06/26/2016 0741   K 3.7 10/14/2016 0835   K 3.5 06/26/2016 0741   CL 105 10/14/2016 0835   CO2 27 10/14/2016 0835   CO2 26 06/26/2016 0741   BUN 15 10/14/2016 0835   BUN 10.2 06/26/2016 0741   CREATININE 0.92 10/14/2016 0835   CREATININE 0.9 06/26/2016 0741      Component Value Date/Time   CALCIUM  9.0 10/14/2016 0835   CALCIUM 9.4 06/26/2016 0741   ALKPHOS 46 10/14/2016 0835   ALKPHOS 62 06/26/2016 0741   AST 26 10/14/2016 0835   AST 23 06/26/2016 0741   ALT 23 10/14/2016 0835   ALT 30 06/26/2016 0741   BILITOT 0.7 10/14/2016 0835   BILITOT 0.69 06/26/2016 0741      ASSESSMENT & PLAN:  Multiple myeloma in remission ([place]) I reviewed test results with her at her husband. So far, her bone marrow biopsy and blood work confirmed complete remission. We are waiting for 24-hour urine collection and further testing. We discussed further care. The patient is comfortable to remain on Revlimid indefinitely. She will continue calcium with vitamin D supplement along with aspirin for DVT prophylaxis. 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 I will see her back in 3 months for further review.  Drug-induced pancytopenia ([place]) This is likely due to recent treatment. The patient denies recent history of fevers, cough, chills, diarrhea or dysuria. She is asymptomatic from the leukopenia. I will observe for now.  I will continue the chemotherapy at current dose without dosage adjustment.  If the leukopenia gets progressive worse in the future, I might have to delay her treatment or adjust the chemotherapy dose per protocol    Bone pain  She have chronic bone pain which started with a diagnosis of multiple myeloma. We discussed chronic pain management. MRI did suggest she had evidence of degenerative disc disease and spinal stenosis. After much discussion, the patient is comfortable to remain on chronic narcotic prescription. We discussed narcotic refill policy   No orders of the defined types were placed in this encounter.  All questions were answered. The patient knows to call the clinic with any problems, questions or concerns. No barriers to learning was detected. I spent 20 minutes counseling the patient face to face. The total time spent in the  appointment was 25 minutes and more than 50% was on counseling and review of test results     Heath Lark, MD 10/21/2016 9:52 AM

## 2016-10-21 NOTE — Assessment & Plan Note (Signed)
She have chronic bone pain which started with a diagnosis of multiple myeloma. We discussed chronic pain management. MRI did suggest she had evidence of degenerative disc disease and spinal stenosis. After much discussion, the patient is comfortable to remain on chronic narcotic prescription. We discussed narcotic refill policy

## 2016-10-27 ENCOUNTER — Other Ambulatory Visit: Payer: Self-pay | Admitting: *Deleted

## 2016-10-27 DIAGNOSIS — C9001 Multiple myeloma in remission: Secondary | ICD-10-CM

## 2016-10-27 LAB — TISSUE HYBRIDIZATION (BONE MARROW)-NCBH

## 2016-10-27 LAB — CHROMOSOME ANALYSIS, BONE MARROW

## 2016-10-27 MED ORDER — LENALIDOMIDE 5 MG PO CAPS CTSU E1A11: ORAL_CAPSULE | ORAL | 0 refills | Status: DC

## 2016-10-27 NOTE — Telephone Encounter (Signed)
Revlimid will not E-scribe to [facility] for some reason. (Able to E-scribe Revlimid to all other specialty pharmacies except [facility]).  Paper Rx placed on Dr. Calton Dach desk for her signature.  Dr. Alvy Bimler out of office today.  Will fax Rx to [facility] tomorrow after it is signed.

## 2016-10-28 LAB — UIFE/LIGHT CHAINS/TP QN, 24-HR UR
FR KAPPA LT CH,24HR: 16 mg/24 hr
FR LAMBDA LT CH,24HR: 1 mg/24 hr
Free Kappa Lt Chains,Ur: 8.22 mg/L (ref 1.35–24.19)
Free Lambda Lt Chains,Ur: 0.31 mg/L (ref 0.24–6.66)
Kappa/Lambda Ratio,U: 26.52 — ABNORMAL HIGH (ref 2.04–10.37)
PROTEIN,TOTAL,URINE: 5 mg/dL
Prot,24hr calculated: 98 mg/24 hr (ref 30–150)

## 2016-10-28 LAB — UPEP/TP, 24-HR URINE
Albumin, U: 0 %
Alpha-1-Globulin, U: 0 %
Alpha-2-Globulin, U: 0 %
Beta Globulin, U: 0 %
Gamma Globulin, U: 0 %

## 2016-10-31 ENCOUNTER — Encounter (HOSPITAL_COMMUNITY): Payer: Self-pay

## 2016-11-05 ENCOUNTER — Ambulatory Visit
Admission: RE | Admit: 2016-11-05 | Discharge: 2016-11-05 | Disposition: A | Discharge status: Home / Self Care | Payer: BLUE CROSS/BLUE SHIELD | Source: Ambulatory Visit | Attending: Obstetrics & Gynecology | Admitting: Obstetrics & Gynecology

## 2016-11-05 DIAGNOSIS — Z1231 Encounter for screening mammogram for malignant neoplasm of breast: Secondary | ICD-10-CM

## 2016-11-18 ENCOUNTER — Other Ambulatory Visit (HOSPITAL_BASED_OUTPATIENT_CLINIC_OR_DEPARTMENT_OTHER): Payer: BLUE CROSS/BLUE SHIELD

## 2016-11-18 ENCOUNTER — Encounter: Payer: BLUE CROSS/BLUE SHIELD | Admitting: *Deleted

## 2016-11-18 ENCOUNTER — Encounter: Payer: Self-pay | Admitting: Hematology and Oncology

## 2016-11-18 DIAGNOSIS — C9001 Multiple myeloma in remission: Secondary | ICD-10-CM | POA: Diagnosis not present

## 2016-11-18 DIAGNOSIS — Z006 Encounter for examination for normal comparison and control in clinical research program: Secondary | ICD-10-CM

## 2016-11-18 LAB — CBC WITH DIFFERENTIAL/PLATELET
BASO%: 0.4 % (ref 0.0–2.0)
Basophils Absolute: 0 10*3/uL (ref 0.0–0.1)
EOS%: 1.2 % (ref 0.0–7.0)
Eosinophils Absolute: 0 10*3/uL (ref 0.0–0.5)
HCT: 37.4 % (ref 34.8–46.6)
HGB: 11.5 g/dL — ABNORMAL LOW (ref 11.6–15.9)
LYMPH%: 43.2 % (ref 14.0–49.7)
MCH: 22.7 pg — ABNORMAL LOW (ref 25.1–34.0)
MCHC: 30.7 g/dL — ABNORMAL LOW (ref 31.5–36.0)
MCV: 73.9 fL — ABNORMAL LOW (ref 79.5–101.0)
MONO#: 0.5 10*3/uL (ref 0.1–0.9)
MONO%: 11.8 % (ref 0.0–14.0)
NEUT#: 1.7 10*3/uL (ref 1.5–6.5)
NEUT%: 43.4 % (ref 38.4–76.8)
Platelets: 222 10*3/uL (ref 145–400)
RBC: 5.06 10*6/uL (ref 3.70–5.45)
RDW: 14.4 % (ref 11.2–14.5)
WBC: 4 10*3/uL (ref 3.9–10.3)
lymph#: 1.7 10*3/uL (ref 0.9–3.3)

## 2016-11-18 MED ORDER — LENALIDOMIDE 5 MG PO CAPS CTSU E1A11: ORAL_CAPSULE | ORAL | 0 refills | Status: DC

## 2016-11-18 NOTE — Progress Notes (Signed)
11/18/2016 Patient in to clinic today, accompanied by her husband, Arnell Sieving, for hematology tests, prior to beginning maintenance treatment Cycle 26. Patient returned completed cycle 25 Medication Calendar, confirming correct dosing as prescribed (dose level -3, Revlimid 5mg  every other day, Days 1-21). Patient given printed appointment calendar with Cycle 26 treatment days marked, to document doses for the upcoming cycle, which will begin today, following confirmation of adequate blood counts. Patient given instructions to take Revlimid once every other day, beginning today, and ending on Day 21, once blood counts have been confirmed as meeting protocol requirements. Patient voiced understanding. She reports ongoing back pain, joint pain and fatigue, as recorded on medication calendar, requiring daily pain medication. She continues to have difficulty sleeping, but this is improved by taking a sleeping pill every evening.   Spoke with patient via her mobile phone after her departure from the clinic, confirming counts acceptable to resume treatment today. [name] BSN, RN, [place] 11/18/2016 10:39 AM

## 2016-11-18 NOTE — Progress Notes (Signed)
10/21/2016 Patient in to clinic today for review of bone marrow, myeloma panel and hematology and chemistry results, prior to beginning treatment Cycle 25 today. See MD note for details. Patient reports ongoing complaints as noted in AE table and list below. Based on lab results review and history and physical by Dr. Alvy Bimler, patient meets criteria for continued treatment with Revlimid. 24-hour urine sample to be collected to confirm response.  Adverse Event Log Study/Protocol: [facility] ECOG [id number] Maintenance Cycle 24: 09/11/2016 - 10/21/2016  Event Grade Attribution to lenalidomide Comments  Fatigue  Grade 2  Probable Moderate, occasionally limiting ADLs  Sensory neuropathy  Grade 2 Unlikely Moderate symptoms, unchanged from previous  Insomnia  Grade 2  Unlikely Moderate difficulty, takes sleeping pill regularly.  Neutrophil count decreased Grade 2-3  Definite    Dyspnea Grade 1 Unlikely Associated with fatigue  Musculoskeletal, other  Grade 3 Unlikely Left shoulder strain (ED 08/14/16)  Back pain Grade 3 Unlikely Lumbar back pain  Anemia Grade 1 Definite     Non-reportable AEs (grade): - Moderate pain (2) ? Joint pain - ankle, wrist, hip, knee ? Leg pain ? Back pain ? Bone pain ? Generalized pain - Headaches, migraines - moderate (2) - Leukopenia (1) - Pyogenic granuloma (2) - Constipation (1) - Weight loss (1) - Diarrhea (1) [name] BSN, RN, CCRP 12/21/2016 10:12 AM

## 2016-12-03 ENCOUNTER — Other Ambulatory Visit: Payer: Self-pay | Admitting: *Deleted

## 2016-12-03 DIAGNOSIS — C9001 Multiple myeloma in remission: Secondary | ICD-10-CM

## 2016-12-03 MED ORDER — LENALIDOMIDE 5 MG PO CAPS CTSU E1A11: ORAL_CAPSULE | ORAL | 0 refills | Status: DC

## 2016-12-08 ENCOUNTER — Telehealth: Payer: Self-pay | Admitting: Pharmacist

## 2016-12-08 NOTE — Telephone Encounter (Signed)
Oral Chemotherapy Pharmacist Encounter  Received notification from [facility] Patient Support that patient has been successfully enrolled into their Commercial Co-pay Program. Dates: 12/04/16-11/30/17 Under this program, [employer] will provide support to reduce patient's co-payment to $25/prescription up to a maximum benefit of $10,000.  I called [facility] to make sure they were aware if this copay support since they are filling this prescription. They are aware, new prescription processed for delivery 1/8 or 12/09/16, copay $25.  No other needs from [facility] at this time. We will place the approval letter to be scanned into patient's chart and will sign off at this time. Please let us know if we can be of further support in the future.  Johny Drilling, PharmD, BCPS, BCOP 12/08/2016  2:43 PM [facility] Oncology [facility] [phone]

## 2016-12-16 ENCOUNTER — Other Ambulatory Visit (HOSPITAL_BASED_OUTPATIENT_CLINIC_OR_DEPARTMENT_OTHER): Payer: BLUE CROSS/BLUE SHIELD

## 2016-12-16 ENCOUNTER — Encounter: Payer: BLUE CROSS/BLUE SHIELD | Admitting: *Deleted

## 2016-12-16 ENCOUNTER — Encounter: Payer: Self-pay | Admitting: Hematology and Oncology

## 2016-12-16 DIAGNOSIS — C9001 Multiple myeloma in remission: Secondary | ICD-10-CM

## 2016-12-16 DIAGNOSIS — Z006 Encounter for examination for normal comparison and control in clinical research program: Secondary | ICD-10-CM | POA: Diagnosis not present

## 2016-12-16 LAB — CBC WITH DIFFERENTIAL/PLATELET
BASO%: 0.6 % (ref 0.0–2.0)
Basophils Absolute: 0 10*3/uL (ref 0.0–0.1)
EOS%: 1.7 % (ref 0.0–7.0)
Eosinophils Absolute: 0.1 10*3/uL (ref 0.0–0.5)
HCT: 38.3 % (ref 34.8–46.6)
HGB: 11.8 g/dL (ref 11.6–15.9)
LYMPH%: 50.4 % — ABNORMAL HIGH (ref 14.0–49.7)
MCH: 22.9 pg — ABNORMAL LOW (ref 25.1–34.0)
MCHC: 30.8 g/dL — ABNORMAL LOW (ref 31.5–36.0)
MCV: 74.4 fL — ABNORMAL LOW (ref 79.5–101.0)
MONO#: 0.4 10*3/uL (ref 0.1–0.9)
MONO%: 12.2 % (ref 0.0–14.0)
NEUT#: 1.3 10*3/uL — ABNORMAL LOW (ref 1.5–6.5)
NEUT%: 35.1 % — ABNORMAL LOW (ref 38.4–76.8)
Platelets: 192 10*3/uL (ref 145–400)
RBC: 5.15 10*6/uL (ref 3.70–5.45)
RDW: 15.3 % — ABNORMAL HIGH (ref 11.2–14.5)
WBC: 3.6 10*3/uL — ABNORMAL LOW (ref 3.9–10.3)
lymph#: 1.8 10*3/uL (ref 0.9–3.3)

## 2016-12-16 MED ORDER — LENALIDOMIDE 5 MG PO CAPS CTSU E1A11: ORAL_CAPSULE | ORAL | 0 refills | Status: DC

## 2016-12-16 NOTE — Progress Notes (Signed)
12/16/2016 Patient in to clinic today for hematology evaluation prior to receiving treatment cycle 27. Patient reports ongoing pain as previously noted, as well as moderate fatigue and difficulty sleeping (takes sleeping pill). Her neuropathy symptoms are unchanged. She reports one episode of mild nausea, without vomiting, after taking two sleeping pills close together on 12/06/2016. Patient returned completed cycle 26 Medication Calendar, confirming correct dosing as prescribed(dose level -3, Revlimid 5mg  every other day, Days 1-21). Patient given printed appointment calendar with Cycle 27 treatment days marked, to document doses for the upcoming cycle, which will begin today, following confirmation of adequate blood counts. Patient given instructions to take Revlimid once every other day, beginning today, and ending on Day 21, Patient instructed to await notification from research nurse before beginning new cycle of lenalidomide, once CBC results have been reviewed. Per patient, telephone call should be made to home phone number, as she will be driving, and a message left with her husband who is at home. Spoke with patient's husband, Arnell Sieving, to let him know that lab test results were acceptable and that patient should begin dosing with lenalidomide today as instructed. [name] BSN, RN, [place] 12/16/2016 11:09 AM

## 2016-12-21 NOTE — Progress Notes (Signed)
Adverse Event log covers the period for Maintenance Cycles 22-24, 07/03/2016 - 10/21/2016. [name] BSN, RN, CCRP 12/21/2016 10:22 AM

## 2016-12-24 ENCOUNTER — Telehealth: Payer: Self-pay | Admitting: *Deleted

## 2016-12-24 ENCOUNTER — Encounter: Payer: Self-pay | Admitting: Hematology and Oncology

## 2016-12-24 DIAGNOSIS — C9001 Multiple myeloma in remission: Secondary | ICD-10-CM

## 2016-12-24 MED ORDER — VENLAFAXINE HCL ER 150 MG PO CP24: 150.0000 mg | ORAL_CAPSULE | Freq: Every day | ORAL | 1 refills | Status: DC

## 2016-12-24 NOTE — Telephone Encounter (Signed)
Dr Jaynee Eagles- I had already sent in a 90 day supply with 1 refills. Is that ok?

## 2016-12-24 NOTE — Telephone Encounter (Signed)
Thanks Terrence Dupont, send her a 90-day supply with 2 refills as well so she has enough to get her through to the appointment in July thanks

## 2016-12-24 NOTE — Telephone Encounter (Signed)
Perfect thanks

## 2016-12-24 NOTE — Telephone Encounter (Signed)
Pt sent My Chart message to Dr. Alvy Bimler;  "I have had a cold for the past four days and it does not appear to be getting better. one lung seems clear but the other one tires me out. have very little energy. could Dr. Magdalen Spatz presribe something for me. this was very hard for me to type."

## 2016-12-24 NOTE — Telephone Encounter (Signed)
I have to leave soon, unfortunately tomorrow my clinic is full She needs to be seen by symptom management or PCP

## 2016-12-24 NOTE — Telephone Encounter (Signed)
Called pt on mobile number and verified she is taking venlafaxine 150mg  capsule (1 cap po daily). She is doing well on medication. Made 1 yr f/u on 06/25/17 with AA,MD at 830am, check in 800am. Advised I will send in refill for 90 day supply to her pharmacy as requested.  She verbalized understanding.

## 2016-12-24 NOTE — Telephone Encounter (Signed)
Tried calling pt on home number: [phone]. Got busy signal  Received fax refill request from [facility] on [address] for venlafaxine 90 day supply. Patient last seen by Dr Jaynee Eagles on 06/25/16. Dr Jaynee Eagles wanted her to f/u in 1 yr. No f/u scheduled.  Need to verify she is still taking medication as prescribed and set up f/u.

## 2016-12-24 NOTE — Telephone Encounter (Signed)
Returned call to patient. Selena Lesser, NP has agreed to see patient in am. Informed patient to come in to outpatient radiology for 2V CXR at 0900 tomorrow morning (12/25/2016), then check-in @ [facility] for labs and OV. Instructed patient to go to ED if symptoms worsen between now and her scheduled appts tomorrow. Patient verbalized understanding.

## 2016-12-25 ENCOUNTER — Other Ambulatory Visit (HOSPITAL_BASED_OUTPATIENT_CLINIC_OR_DEPARTMENT_OTHER): Payer: BLUE CROSS/BLUE SHIELD

## 2016-12-25 ENCOUNTER — Ambulatory Visit (HOSPITAL_COMMUNITY)
Admission: RE | Admit: 2016-12-25 | Discharge: 2016-12-25 | Disposition: A | Discharge status: Home / Self Care | Payer: BLUE CROSS/BLUE SHIELD | Source: Ambulatory Visit | Attending: Nurse Practitioner | Admitting: Nurse Practitioner

## 2016-12-25 ENCOUNTER — Ambulatory Visit (HOSPITAL_BASED_OUTPATIENT_CLINIC_OR_DEPARTMENT_OTHER): Payer: BLUE CROSS/BLUE SHIELD | Admitting: Nurse Practitioner

## 2016-12-25 ENCOUNTER — Encounter: Payer: Self-pay | Admitting: Nurse Practitioner

## 2016-12-25 ENCOUNTER — Other Ambulatory Visit: Payer: Self-pay | Admitting: *Deleted

## 2016-12-25 ENCOUNTER — Other Ambulatory Visit (HOSPITAL_COMMUNITY)
Admission: RE | Admit: 2016-12-25 | Discharge: 2016-12-25 | Disposition: A | Discharge status: Home / Self Care | Payer: BLUE CROSS/BLUE SHIELD | Source: Ambulatory Visit | Attending: Nurse Practitioner | Admitting: Nurse Practitioner

## 2016-12-25 ENCOUNTER — Other Ambulatory Visit: Payer: Self-pay | Admitting: Hematology and Oncology

## 2016-12-25 VITALS — BP 117/72 | HR 78 | Temp 98.3°F | Resp 18 | Ht 67.0 in | Wt 165.0 lb

## 2016-12-25 DIAGNOSIS — D701 Agranulocytosis secondary to cancer chemotherapy: Secondary | ICD-10-CM | POA: Diagnosis not present

## 2016-12-25 DIAGNOSIS — R5081 Fever presenting with conditions classified elsewhere: Secondary | ICD-10-CM

## 2016-12-25 DIAGNOSIS — R911 Solitary pulmonary nodule: Secondary | ICD-10-CM | POA: Insufficient documentation

## 2016-12-25 DIAGNOSIS — C9001 Multiple myeloma in remission: Secondary | ICD-10-CM

## 2016-12-25 DIAGNOSIS — J209 Acute bronchitis, unspecified: Secondary | ICD-10-CM

## 2016-12-25 DIAGNOSIS — J101 Influenza due to other identified influenza virus with other respiratory manifestations: Secondary | ICD-10-CM

## 2016-12-25 DIAGNOSIS — R918 Other nonspecific abnormal finding of lung field: Secondary | ICD-10-CM | POA: Diagnosis not present

## 2016-12-25 DIAGNOSIS — J069 Acute upper respiratory infection, unspecified: Secondary | ICD-10-CM | POA: Diagnosis not present

## 2016-12-25 DIAGNOSIS — T451X5A Adverse effect of antineoplastic and immunosuppressive drugs, initial encounter: Secondary | ICD-10-CM | POA: Insufficient documentation

## 2016-12-25 LAB — CBC WITH DIFFERENTIAL/PLATELET
BASO%: 0.4 % (ref 0.0–2.0)
Basophils Absolute: 0 10*3/uL (ref 0.0–0.1)
EOS%: 0.7 % (ref 0.0–7.0)
Eosinophils Absolute: 0 10*3/uL (ref 0.0–0.5)
HCT: 38.8 % (ref 34.8–46.6)
HGB: 12.6 g/dL (ref 11.6–15.9)
LYMPH%: 46.3 % (ref 14.0–49.7)
MCH: 23.2 pg — ABNORMAL LOW (ref 25.1–34.0)
MCHC: 32.5 g/dL (ref 31.5–36.0)
MCV: 71.5 fL — ABNORMAL LOW (ref 79.5–101.0)
MONO#: 0.3 10*3/uL (ref 0.1–0.9)
MONO%: 9.9 % (ref 0.0–14.0)
NEUT#: 1.2 10*3/uL — ABNORMAL LOW (ref 1.5–6.5)
NEUT%: 42.7 % (ref 38.4–76.8)
Platelets: 184 10*3/uL (ref 145–400)
RBC: 5.43 10*6/uL (ref 3.70–5.45)
RDW: 14.6 % — ABNORMAL HIGH (ref 11.2–14.5)
WBC: 2.8 10*3/uL — ABNORMAL LOW (ref 3.9–10.3)
lymph#: 1.3 10*3/uL (ref 0.9–3.3)

## 2016-12-25 LAB — COMPREHENSIVE METABOLIC PANEL
ALT: 29 U/L (ref 0–55)
AST: 29 U/L (ref 5–34)
Albumin: 3.6 g/dL (ref 3.5–5.0)
Alkaline Phosphatase: 64 U/L (ref 40–150)
Anion Gap: 10 mEq/L (ref 3–11)
BUN: 10.2 mg/dL (ref 7.0–26.0)
CO2: 24 mEq/L (ref 22–29)
Calcium: 9.1 mg/dL (ref 8.4–10.4)
Chloride: 103 mEq/L (ref 98–109)
Creatinine: 0.9 mg/dL (ref 0.6–1.1)
EGFR: 76 mL/min/{1.73_m2} — ABNORMAL LOW (ref >90)
Glucose: 107 mg/dl (ref 70–140)
Potassium: 3.7 mEq/L (ref 3.5–5.1)
Sodium: 137 mEq/L (ref 136–145)
Total Bilirubin: 0.5 mg/dL (ref 0.20–1.20)
Total Protein: 7.9 g/dL (ref 6.4–8.3)

## 2016-12-25 LAB — INFLUENZA PANEL BY PCR (TYPE A & B)
Influenza A By PCR: NEGATIVE
Influenza B By PCR: POSITIVE — AB

## 2016-12-25 MED ORDER — OSELTAMIVIR PHOSPHATE 75 MG PO CAPS: 75.0000 mg | ORAL_CAPSULE | Freq: Two times a day (BID) | ORAL | 0 refills | Status: DC

## 2016-12-25 NOTE — Assessment & Plan Note (Addendum)
Patient is undergoing a clinical trial of Revlimid oral therapy as directed.  She states that she takes the Revlimid every other day for a total of 21 days.  She states that she is in the midst of the 21 day Revlimid at this present time.  Patient is scheduled to return for labs only on 01/06/2017.  She is scheduled for follow-up visit with Dr. Alvy Bimler on 01/13/2017. ________________________________________________________________________________  Update: Patient did test positive for influenza B today; and per instructions of the medical research nurse-patient will hold the oral Revlimid treatment until further notice.  Also of note-the Revlimid oral therapy should not be considered the causative agent resulting in the positive influenza B diagnosis.  Patient states that her husband had the same symptoms prior to her; and the flu is considered a contagious viral disease.

## 2016-12-25 NOTE — Progress Notes (Signed)
SYMPTOM MANAGEMENT CLINIC    Chief Complaint: URI, bronchitis, questionable flu  HPI:  Carla Perry 61 y.o. female diagnosed with multiple myeloma.  Currently undergoing a clinical trial of Revlimid oral therapy.   Oncology History   ISS stage 1 IgG lambda subtype (serum albumin 3.6, Beta2 microglobulin 2.32) Durie Salmon Stage 1     Multiple myeloma in remission ([place])   10/10/2013 Imaging    Skeletal survery was negative      11/09/2013 Bone Marrow Biopsy    BM biopsy confirmed myeloma, 76% involved, IgG lambda subtype      12/06/2013 - 08/29/2014 Chemotherapy    Sh received chemo with revlimid, Velcade, Dexamethasone and Zometa. 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      05/05/2014 Imaging    Imaging study of the neck showed no explanation that could cause right neck pain. She is noted to have incidental left upper lung nodule. Plan to repeat imaging study in 3 months.      09/06/2014 Imaging    Bone survey showed no evidence of fracture      09/14/2014 Bone Marrow Biopsy    Bone marrow biopsy showed 8% residual plasma cells by manual count but none on the biopsy specimen      09/26/2014 -  Chemotherapy    She is started on cycle 1 of maintenance Revlimid      01/31/2015 Imaging     chest x-ray showed pneumonia. Treatment was placed on hold.      05/03/2015 Bone Marrow Biopsy    Accession: [id number] repeat bone marrow aspirate and biopsy show 5% residual plasma cells      10/14/2016 Bone Marrow Biopsy    Bone marrow biopsy showed the plasma cells represent 4% of all cells with lack of large aggregates or sheets. To assess the plasma cell clonality, immunohistochemical stains is performed and it lack clonality. Normal cytogenetics and FISH       Review of Systems    Constitutional: Positive for chills, fever, malaise/fatigue and weight loss.  HENT: Positive for congestion and sore throat.   Respiratory: Positive for cough.   Musculoskeletal: Positive for myalgias.  All other systems reviewed and are negative.   Past Medical History:  Diagnosis Date  . Abnormal thyroid function test 08/30/2014  . Anemia   . Anemia, unspecified 10/06/2013  . Bone pain 10/21/2013  . Bronchitis 01/31/2015  . Diabetes mellitus without complication ([place]) 56/2130   Steroid induced diabetes. has not picked oral med up from pharmacy as of 09-14-14  . Diverticulitis 07/18/2014  . DVT (deep venous thrombosis) ([place]) 02/07/2014  . HBP (high blood pressure)   . Insomnia 02/11/2016  . Leukopenia due to antineoplastic chemotherapy ([place]) 12/27/2013  . Memory loss   . MGUS (monoclonal gammopathy of unknown significance)   . MGUS (monoclonal gammopathy of unknown significance) 10/06/2013  . Migraine   . Multiple myeloma, without mention of having achieved remission 11/16/2013  . Pancytopenia ([place]) 12/27/2015  . Peripheral neuropathy ([place]) 12/27/2015  . Right leg swelling 02/07/2014  . Seizure ([place]) 1960   single seizure episode at age 73  . Thyroid disorder   . Vitamin D deficiency 10/24/2014    Past Surgical History:  Procedure Laterality Date  . HEMORRHOID SURGERY    .  PORT-A-CATH REMOVAL  10-2014  . 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    has No Known Allergies.  Allergies as of 12/25/2016   No Known Allergies     Medication List       Accurate as of 12/25/16 12:58 PM. Always use your most recent med list.          carboxymethylcellulose 0.5 % Soln Commonly known as:  REFRESH PLUS Place 1 drop into both eyes daily as needed (for dry eyes).   CENTRUM SILVER PO Take 1 tablet by mouth daily.   hydrochlorothiazide 25 MG tablet Commonly known as:  HYDRODIURIL Take 25 mg by mouth every evening.   lenalidomide 5 MG capsule Commonly known as:  REVLIMID Take 1 capsule every other day for 21 days, off 7 days. Repeat every 28 days.   levothyroxine 75 MCG tablet Commonly known as:  SYNTHROID, LEVOTHROID Take 75 mcg by mouth daily before breakfast.   ONGLYZA 5 MG Tabs tablet Generic drug:  saxagliptin HCl Take 5 mg by mouth daily.   oseltamivir 75 MG capsule Commonly known as:  TAMIFLU Take 1 capsule (75 mg total) by mouth 2 (two) times daily.   oxyCODONE 15 MG immediate release tablet Commonly known as:  ROXICODONE Take 1 tablet (15 mg total) by mouth 3 (three) times daily as needed for pain.   polyethylene glycol packet Commonly known as:  MIRALAX / GLYCOLAX Take 17 g by mouth daily as needed for mild constipation.   predniSONE 20 MG tablet Commonly known as:  DELTASONE Take 1 tablet (20 mg total) by mouth daily.   RESTASIS 0.05 % ophthalmic emulsion Generic drug:  cycloSPORINE PLACE 1 DROP IN BOTH EYES IN THE EVENING   traMADol 50 MG tablet Commonly known as:  ULTRAM Take 1 tablet (50 mg total) by mouth every 6 (six) hours as needed for moderate pain.   traZODone 50 MG tablet Commonly known as:  DESYREL TAKE 1 TABLET (50 MG TOTAL) BY MOUTH AT BEDTIME.   venlafaxine XR 150 MG 24 hr capsule Commonly  known as:  EFFEXOR-XR Take 1 capsule (150 mg total) by mouth daily with breakfast.   verapamil 120 MG 24 hr capsule Commonly known as:  VERELAN PM Take 1 capsule by mouth  daily   Vitamin D3 2000 units Tabs Take 2,000 Units by mouth daily.   vitamin E 1000 UNIT capsule Generic drug:  vitamin E Take 1,000 Units by mouth daily.        PHYSICAL EXAMINATION  Oncology Vitals 12/25/2016 10/21/2016  Height 170 cm 170 cm  Weight 74.844 kg 77.565 kg  Weight (lbs) 165 lbs 171 lbs  BMI (kg/m2) 25.84 kg/m2 26.78 kg/m2  Temp 98.3 98.2  Pulse 78 61  Resp 18 18  SpO2 100 100  BSA (m2) 1.88 m2 1.92 m2   BP Readings from Last 2 Encounters:  12/25/16 117/72  10/21/16 134/85    Physical Exam  Constitutional: She is oriented to person, place, and time. Vital signs are normal. She appears unhealthy.  HENT:  Head: Normocephalic and atraumatic.  Mouth/Throat: Oropharynx is clear and moist.  Eyes: Conjunctivae and EOM are normal. Pupils are equal, round, and reactive to light. Right eye exhibits no discharge. Left eye exhibits no discharge. No scleral icterus.  Neck: Normal range of motion. Neck supple. No JVD present. No tracheal deviation present. No thyromegaly present.  Cardiovascular: Normal rate, regular rhythm, normal heart sounds and intact distal pulses.   Pulmonary/Chest: Effort normal and breath sounds normal. No respiratory distress. She has no wheezes. She has no rales. She exhibits no tenderness.  Congested cough  Abdominal: Soft. Bowel sounds are normal. She exhibits no distension and no mass. There is no tenderness. There is no rebound and no guarding.  Musculoskeletal: Normal range of motion. She exhibits no edema or tenderness.  Lymphadenopathy:    She has no cervical adenopathy.  Neurological: She is alert and oriented to person, place, and time. Gait normal.  Skin: Skin is warm and dry. No rash noted. No erythema. No pallor.  Psychiatric: Affect normal.  Nursing note  and vitals reviewed.   LABORATORY DATA:. Hospital Outpatient Visit on 12/25/2016  Component Date Value Ref Range Status  . Influenza A By PCR 12/25/2016 NEGATIVE  NEGATIVE Final  . Influenza B By PCR 12/25/2016 POSITIVE* NEGATIVE Final   Comment: (NOTE) The Xpert Xpress Flu assay is intended as an aid in the diagnosis of  influenza and should not be used as a sole basis for treatment.  This  assay is FDA approved for nasopharyngeal swab specimens only. Nasal  washings and aspirates are unacceptable for Xpert Xpress Flu testing.   Appointment on 12/25/2016  Component Date Value Ref Range Status  . WBC 12/25/2016 2.8* 3.9 - 10.3 10e3/uL Final  . NEUT# 12/25/2016 1.2* 1.5 - 6.5 10e3/uL Final  . HGB 12/25/2016 12.6  11.6 - 15.9 g/dL Final  . HCT 12/25/2016 38.8  34.8 - 46.6 % Final  . Platelets 12/25/2016 184  145 - 400 10e3/uL Final  . MCV 12/25/2016 71.5* 79.5 - 101.0 fL Final  . MCH 12/25/2016 23.2* 25.1 - 34.0 pg Final  . MCHC 12/25/2016 32.5  31.5 - 36.0 g/dL Final  . RBC 12/25/2016 5.43  3.70 - 5.45 10e6/uL Final  . RDW 12/25/2016 14.6* 11.2 - 14.5 % Final  . lymph# 12/25/2016 1.3  0.9 - 3.3 10e3/uL Final  . MONO# 12/25/2016 0.3  0.1 - 0.9 10e3/uL Final  . Eosinophils Absolute 12/25/2016 0.0  0.0 - 0.5 10e3/uL Final  . Basophils Absolute 12/25/2016 0.0  0.0 - 0.1 10e3/uL Final  . NEUT% 12/25/2016 42.7  38.4 - 76.8 % Final  . LYMPH% 12/25/2016 46.3  14.0 - 49.7 % Final  . MONO% 12/25/2016 9.9  0.0 - 14.0 % Final  . EOS% 12/25/2016 0.7  0.0 - 7.0 % Final  . BASO% 12/25/2016 0.4  0.0 - 2.0 % Final  . Sodium 12/25/2016 137  136 - 145 mEq/L Final  . Potassium 12/25/2016 3.7  3.5 - 5.1 mEq/L Final  . Chloride 12/25/2016 103  98 - 109 mEq/L Final  . CO2 12/25/2016 24  22 - 29 mEq/L Final  . Glucose 12/25/2016 107  70 - 140 mg/dl Final  . BUN 12/25/2016 10.2  7.0 - 26.0 mg/dL Final  . Creatinine 12/25/2016 0.9  0.6 - 1.1 mg/dL Final  . Total Bilirubin 12/25/2016 0.50  0.20 - 1.20  mg/dL Final  . Alkaline Phosphatase 12/25/2016 64  40 - 150 U/L Final  . AST 12/25/2016 29  5 - 34 U/L Final  . ALT 12/25/2016 29  0 - 55 U/L Final  . Total Protein 12/25/2016 7.9  6.4 - 8.3 g/dL Final  . Albumin 12/25/2016 3.6  3.5 - 5.0 g/dL Final  . Calcium 12/25/2016 9.1  8.4 - 10.4 mg/dL Final  . Anion Gap 12/25/2016 10  3 - 11 mEq/L Final  . EGFR 12/25/2016 76* >90 ml/min/1.73 m2 Final    RADIOGRAPHIC STUDIES: Dg Chest 2 View  Result Date: 12/25/2016 CLINICAL DATA:  61 year old female with Posterior left chest pain shortness of breath fever and cough. multiple myeloma in remission. Initial encounter. EXAM: CHEST  2 VIEW COMPARISON:  Skeletal survey 10/13/2016. Chest radiographs 08/14/2016. FINDINGS: Stable and normal lung volumes. Normal cardiac size and mediastinal contours. Visualized tracheal air column is within normal limits. There is a 10 mm nodular density projecting in the right mid lung partially over the posterior seventh rib (arrow). This somewhat resembles EKG button artifact. Otherwise the lungs are stable and clear with no pneumothorax or pleural effusion. Negative visible bowel gas pattern. Visualized osseous structures appear stable and within normal limits. IMPRESSION: 1. New right mid lung pulmonary nodule (10 mm) versus EKG button artifact. If no EKG or other external object was in place at the time of this image then consider a follow-up Chest CT (noncontrast should suffice) versus repeat chest x-rays in several weeks to re-evaluate. 2. Otherwise stable and negative chest. Electronically Signed   By: Genevie Ann M.D.   On: 12/25/2016 08:33    ASSESSMENT/PLAN:    Multiple myeloma in remission [facility]) Patient is undergoing a clinical trial of Revlimid oral therapy as directed.  She states that she takes the Revlimid every other day for a total of 21 days.  She states that she is in the midst of the 21 day Revlimid at this present time.  Patient is scheduled to return for labs  only on 01/06/2017.  She is scheduled for follow-up visit with Dr. Alvy Bimler on 01/13/2017. ________________________________________________________________________________  Update: Patient did test positive for influenza B today; and per instructions of the medical research nurse-patient will hold the oral Revlimid treatment until further notice.  Also of note-the Revlimid oral therapy should not be considered the causative agent resulting in the positive influenza B diagnosis.  Patient states that her husband had the same symptoms prior to her; and the flu is considered a contagious viral disease.    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  She states that she is in the midst of the Revlimid at this current time.  She presented to the [facility] today with complaint of URI/bronchitis symptoms.  ANC today is decreased down to 1.2.  Patient is afebrile today.  See further notes for details of today's visit.  Influenza B Patient states that she developed cold symptoms which included nasal congestion, sinus drainage, a congested cough, body aches, and a fever to maximum 101 within the past few days.  She also notes that her husband had the same symptoms just prior to her developing the symptoms.  Exam today reveals patient has significant nasal congestion; but posterior oropharynx is clear with no erythema or exudate.  Lungs are essentially clear bilaterally; the patient does have a congested cough.  Vital signs were essentially stable as well and patient was afebrile with temperature 98.3.  Patient was found to be slightly neutropenic with ANC 1.2 today.  X-ray obtained today revealed a questionable new right middle lobe nodule.  Reviewed the x-ray results with Dr. Alvy Bimler; and she stated that she would order a repeat chest x-ray for further evaluation.  When she sees the patient within the next few weeks.  Have obtained a flu swab  and awaiting results at this time.  If flu swab is positive-patient will be prescribed Tamiflu.  If flu swab is negative-patient will be prescribed antibiotics for treatment of URI/bronchitis symptoms.  Also, clinical trial recommendations advise holding the Revlimid and observing until the patient begins to feel better.  Will review all findings with the [place] for further guidance as well. _____________________________________________  Update: Patient's flu swab came back with positive influenza B.  Will prescribe tamoxifen 75 mg twice daily for treatment of the influenza.  Also, this provider reviewed all results with the clinical research nurse; and then advised patient to hold the Revlimid oral therapy until she is advised to resume her Revlimid per the clinical research nurse.  Note: One should not consider that the Revlimid was the causative agent of the influenza B diagnosis.   Patient stated understanding of all instructions; and was in agreement with this plan of care. The patient knows to call the clinic with any problems, questions or concerns.   Total time spent with patient was 40 minutes;  with greater than 75 percent of that time spent in face to face counseling regarding patient's symptoms,  and coordination of care and follow up.  Disclaimer:This dictation was prepared with Dragon/digital dictation along with [employer]. Any transcriptional errors that result from this process are unintentional.  Drue Second, NP 12/25/2016

## 2016-12-25 NOTE — Assessment & Plan Note (Addendum)
Patient states that she developed cold symptoms which included nasal congestion, sinus drainage, a congested cough, body aches, and a fever to maximum 101 within the past few days.  She also notes that her husband had the same symptoms just prior to her developing the symptoms.  Exam today reveals patient has significant nasal congestion; but posterior oropharynx is clear with no erythema or exudate.  Lungs are essentially clear bilaterally; the patient does have a congested cough.  Vital signs were essentially stable as well and patient was afebrile with temperature 98.3.  Patient was found to be slightly neutropenic with ANC 1.2 today.  X-ray obtained today revealed a questionable new right middle lobe nodule.  Reviewed the x-ray results with Dr. Alvy Bimler; and she stated that she would order a repeat chest x-ray for further evaluation.  When she sees the patient within the next few weeks.  Have obtained a flu swab and awaiting results at this time.  If flu swab is positive-patient will be prescribed Tamiflu.  If flu swab is negative-patient will be prescribed antibiotics for treatment of URI/bronchitis symptoms.  Also, clinical trial recommendations advise holding the Revlimid and observing until the patient begins to feel better.  Will review all findings with the [place] for further guidance as well. _____________________________________________  Update: Patient's flu swab came back with positive influenza B.  Will prescribe tamoxifen 75 mg twice daily for treatment of the influenza.  Also, this provider reviewed all results with the clinical research nurse; and then advised patient to hold the Revlimid oral therapy until she is advised to resume her Revlimid per the clinical research nurse.  Note: One should not consider that the Revlimid was the causative agent of the influenza B diagnosis.

## 2016-12-25 NOTE — Assessment & Plan Note (Signed)
Patient continues to take her Revlimid per clinical trial dose.  She states that she is in the midst of the Revlimid at this current time.  She presented to the [facility] today with complaint of URI/bronchitis symptoms.  ANC today is decreased down to 1.2.  Patient is afebrile today.  See further notes for details of today's visit.

## 2016-12-29 ENCOUNTER — Telehealth: Payer: Self-pay | Admitting: *Deleted

## 2016-12-29 NOTE — Telephone Encounter (Signed)
Notified that is OK to resume revlimid on 2/1

## 2016-12-29 NOTE — Telephone Encounter (Signed)
TCT to patient to follow up on her [facility] visit on 12/25/16.  Spoke with patient. She states she is feeling better, though remains fatigued. Denies cough, fever , chills.  Completed tamiflu this morning.  Pt is asking when she should resume her Revlimid.  Will forward this to Dr. Alvy Bimler and her nurse.  No other concerns verbalized.

## 2016-12-29 NOTE — Telephone Encounter (Signed)
She may resume again on 2/1

## 2016-12-31 ENCOUNTER — Other Ambulatory Visit: Payer: Self-pay

## 2016-12-31 ENCOUNTER — Telehealth: Payer: Self-pay

## 2016-12-31 DIAGNOSIS — C9001 Multiple myeloma in remission: Secondary | ICD-10-CM

## 2016-12-31 MED ORDER — LENALIDOMIDE 5 MG PO CAPS CTSU E1A11: ORAL_CAPSULE | ORAL | 0 refills | Status: DC

## 2016-12-31 NOTE — Telephone Encounter (Signed)
Talked with Carla Perry regarding Revlimid. Instructed to start Revlimid  back on 01-01-17 and stop on 01-05-17. Instructed Revlimid prescription to be refilled with only 8 tablets because patient has 3 tablets left over.

## 2017-01-02 ENCOUNTER — Other Ambulatory Visit: Payer: Self-pay | Admitting: Hematology and Oncology

## 2017-01-02 ENCOUNTER — Telehealth: Payer: Self-pay

## 2017-01-02 ENCOUNTER — Ambulatory Visit (HOSPITAL_COMMUNITY)
Admission: RE | Admit: 2017-01-02 | Discharge: 2017-01-02 | Disposition: A | Discharge status: Home / Self Care | Payer: BLUE CROSS/BLUE SHIELD | Source: Ambulatory Visit | Attending: Hematology and Oncology | Admitting: Hematology and Oncology

## 2017-01-02 DIAGNOSIS — R911 Solitary pulmonary nodule: Secondary | ICD-10-CM

## 2017-01-02 NOTE — Telephone Encounter (Signed)
Called patient to left her know of CT of the Chest ordered. Verbalized understanding.

## 2017-01-02 NOTE — Telephone Encounter (Signed)
Opened in error

## 2017-01-02 NOTE — Telephone Encounter (Signed)
-----   Message from Cathlean Cower, RN sent at 01/02/2017 10:30 AM EST ----- Regarding: FW: CXR   ----- Message ----- From: Heath Lark, MD Sent: 01/02/2017  10:19 AM To: Cathlean Cower, RN Subject: CXR                                            Pls let her know I ordered CT chest to be done before she returns to follow on lung nodule ----- Message ----- From: Interface, Rad Results In Sent: 01/02/2017   9:50 AM To: Heath Lark, MD

## 2017-01-06 ENCOUNTER — Ambulatory Visit (HOSPITAL_BASED_OUTPATIENT_CLINIC_OR_DEPARTMENT_OTHER): Payer: BLUE CROSS/BLUE SHIELD

## 2017-01-06 DIAGNOSIS — C9001 Multiple myeloma in remission: Secondary | ICD-10-CM | POA: Diagnosis not present

## 2017-01-06 DIAGNOSIS — Z79899 Other long term (current) drug therapy: Secondary | ICD-10-CM

## 2017-01-06 DIAGNOSIS — Z006 Encounter for examination for normal comparison and control in clinical research program: Secondary | ICD-10-CM

## 2017-01-06 LAB — CBC WITH DIFFERENTIAL/PLATELET
BASO%: 0.7 % (ref 0.0–2.0)
Basophils Absolute: 0 10*3/uL (ref 0.0–0.1)
EOS%: 2.7 % (ref 0.0–7.0)
Eosinophils Absolute: 0.1 10*3/uL (ref 0.0–0.5)
HCT: 37.7 % (ref 34.8–46.6)
HGB: 11.8 g/dL (ref 11.6–15.9)
LYMPH%: 44.8 % (ref 14.0–49.7)
MCH: 23.1 pg — ABNORMAL LOW (ref 25.1–34.0)
MCHC: 31.3 g/dL — ABNORMAL LOW (ref 31.5–36.0)
MCV: 74 fL — ABNORMAL LOW (ref 79.5–101.0)
MONO#: 0.4 10*3/uL (ref 0.1–0.9)
MONO%: 11.8 % (ref 0.0–14.0)
NEUT#: 1.4 10*3/uL — ABNORMAL LOW (ref 1.5–6.5)
NEUT%: 40 % (ref 38.4–76.8)
Platelets: 288 10*3/uL (ref 145–400)
RBC: 5.09 10*6/uL (ref 3.70–5.45)
RDW: 14.9 % — ABNORMAL HIGH (ref 11.2–14.5)
WBC: 3.5 10*3/uL — ABNORMAL LOW (ref 3.9–10.3)
lymph#: 1.6 10*3/uL (ref 0.9–3.3)

## 2017-01-06 LAB — COMPREHENSIVE METABOLIC PANEL
ALT: 36 U/L (ref 0–55)
AST: 20 U/L (ref 5–34)
Albumin: 3.5 g/dL (ref 3.5–5.0)
Alkaline Phosphatase: 59 U/L (ref 40–150)
Anion Gap: 8 mEq/L (ref 3–11)
BUN: 11.2 mg/dL (ref 7.0–26.0)
CO2: 28 mEq/L (ref 22–29)
Calcium: 9.6 mg/dL (ref 8.4–10.4)
Chloride: 106 mEq/L (ref 98–109)
Creatinine: 0.8 mg/dL (ref 0.6–1.1)
EGFR: 88 mL/min/{1.73_m2} — ABNORMAL LOW (ref >90)
Glucose: 86 mg/dl (ref 70–140)
Potassium: 3.8 mEq/L (ref 3.5–5.1)
Sodium: 141 mEq/L (ref 136–145)
Total Bilirubin: 0.32 mg/dL (ref 0.20–1.20)
Total Protein: 7.4 g/dL (ref 6.4–8.3)

## 2017-01-06 LAB — TSH: TSH: 0.653 m(IU)/L (ref 0.308–3.960)

## 2017-01-06 LAB — LACTATE DEHYDROGENASE: LDH: 175 U/L (ref 125–245)

## 2017-01-07 LAB — KAPPA/LAMBDA LIGHT CHAINS
Ig Kappa Free Light Chain: 19.8 mg/L — ABNORMAL HIGH (ref 3.3–19.4)
Ig Lambda Free Light Chain: 17.7 mg/L (ref 5.7–26.3)
Kappa/Lambda FluidC Ratio: 1.12 (ref 0.26–1.65)

## 2017-01-09 ENCOUNTER — Ambulatory Visit (HOSPITAL_COMMUNITY)
Admission: RE | Admit: 2017-01-09 | Discharge: 2017-01-09 | Disposition: A | Discharge status: Home / Self Care | Payer: BLUE CROSS/BLUE SHIELD | Source: Ambulatory Visit | Attending: Hematology and Oncology | Admitting: Hematology and Oncology

## 2017-01-09 DIAGNOSIS — I2510 Atherosclerotic heart disease of native coronary artery without angina pectoris: Secondary | ICD-10-CM | POA: Diagnosis not present

## 2017-01-09 DIAGNOSIS — I700 Atherosclerosis of aorta: Secondary | ICD-10-CM | POA: Insufficient documentation

## 2017-01-09 DIAGNOSIS — R911 Solitary pulmonary nodule: Secondary | ICD-10-CM

## 2017-01-09 DIAGNOSIS — R918 Other nonspecific abnormal finding of lung field: Secondary | ICD-10-CM | POA: Diagnosis not present

## 2017-01-09 LAB — MULTIPLE MYELOMA PANEL, SERUM
Albumin SerPl Elph-Mcnc: 3.6 g/dL (ref 2.9–4.4)
Albumin/Glob SerPl: 1.1 (ref 0.7–1.7)
Alpha 1: 0.2 g/dL (ref 0.0–0.4)
Alpha2 Glob SerPl Elph-Mcnc: 0.6 g/dL (ref 0.4–1.0)
B-Globulin SerPl Elph-Mcnc: 1 g/dL (ref 0.7–1.3)
Gamma Glob SerPl Elph-Mcnc: 1.6 g/dL (ref 0.4–1.8)
Globulin, Total: 3.4 g/dL (ref 2.2–3.9)
IgA, Qn, Serum: 144 mg/dL (ref 87–352)
IgG, Qn, Serum: 1576 mg/dL (ref 700–1600)
IgM, Qn, Serum: 77 mg/dL (ref 26–217)
Total Protein: 7 g/dL (ref 6.0–8.5)

## 2017-01-12 ENCOUNTER — Encounter: Payer: Self-pay | Admitting: *Deleted

## 2017-01-13 ENCOUNTER — Telehealth: Payer: Self-pay | Admitting: Hematology and Oncology

## 2017-01-13 ENCOUNTER — Other Ambulatory Visit: Payer: Self-pay | Admitting: Hematology and Oncology

## 2017-01-13 ENCOUNTER — Encounter: Payer: BLUE CROSS/BLUE SHIELD | Admitting: *Deleted

## 2017-01-13 ENCOUNTER — Encounter: Payer: Self-pay | Admitting: *Deleted

## 2017-01-13 ENCOUNTER — Encounter: Payer: Self-pay | Admitting: Hematology and Oncology

## 2017-01-13 ENCOUNTER — Ambulatory Visit (HOSPITAL_BASED_OUTPATIENT_CLINIC_OR_DEPARTMENT_OTHER): Payer: BLUE CROSS/BLUE SHIELD | Admitting: Hematology and Oncology

## 2017-01-13 DIAGNOSIS — R911 Solitary pulmonary nodule: Secondary | ICD-10-CM | POA: Diagnosis not present

## 2017-01-13 DIAGNOSIS — Z006 Encounter for examination for normal comparison and control in clinical research program: Secondary | ICD-10-CM | POA: Diagnosis not present

## 2017-01-13 DIAGNOSIS — D61811 Other drug-induced pancytopenia: Secondary | ICD-10-CM | POA: Diagnosis not present

## 2017-01-13 DIAGNOSIS — G893 Neoplasm related pain (acute) (chronic): Secondary | ICD-10-CM | POA: Insufficient documentation

## 2017-01-13 DIAGNOSIS — R946 Abnormal results of thyroid function studies: Secondary | ICD-10-CM

## 2017-01-13 DIAGNOSIS — C9001 Multiple myeloma in remission: Secondary | ICD-10-CM

## 2017-01-13 MED ORDER — LENALIDOMIDE 5 MG PO CAPS CTSU E1A11: ORAL_CAPSULE | ORAL | 0 refills | Status: DC

## 2017-01-13 MED ORDER — ASPIRIN 325 MG PO TABS: 325.0000 mg | ORAL_TABLET | Freq: Every day | ORAL | Status: DC

## 2017-01-13 MED ORDER — OXYCODONE HCL 15 MG PO TABS: 15.0000 mg | ORAL_TABLET | Freq: Three times a day (TID) | ORAL | 0 refills | Status: DC | PRN

## 2017-01-13 NOTE — Assessment & Plan Note (Signed)
The lung nodule does not look suspicious for malignancy. I plan to repeat imaging study in 6 months, due in August 2018. She is not symptomatic

## 2017-01-13 NOTE — Telephone Encounter (Signed)
Appointments scheduled per 2/13 LOS. Patient given AVS report and calendars with future scheduled appointments. °

## 2017-01-13 NOTE — Assessment & Plan Note (Signed)
I reviewed test results with her at her husband. So far, her bone marrow biopsy and blood work confirmed complete remission. The patient is comfortable to remain on Revlimid indefinitely. She will continue treatment per research protocol She will continue calcium with vitamin D supplement along with aspirin for DVT prophylaxis. 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 I will see her back in 3 months for further review.

## 2017-01-13 NOTE — Progress Notes (Signed)
[facility] OFFICE PROGRESS NOTE  Patient Care Team: Wenda Low, MD as PCP - General (Internal Medicine) Heath Lark, MD as Consulting Physician (Hematology and Oncology) Benson Norway, RN as Registered Nurse (Oncology)  SUMMARY OF ONCOLOGIC HISTORY: Oncology History   ISS stage 1 IgG lambda subtype (serum albumin 3.6, Beta2 microglobulin 2.32) Durie Salmon Stage 1     Multiple myeloma in remission ([place])   10/10/2013 Imaging    Skeletal survery was negative      11/09/2013 Bone Marrow Biopsy    BM biopsy confirmed myeloma, 76% involved, IgG lambda subtype      12/06/2013 - 08/29/2014 Chemotherapy    Sh received chemo with revlimid, Velcade, Dexamethasone and Zometa. 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      05/05/2014 Imaging    Imaging study of the neck showed no explanation that could cause right neck pain. She is noted to have incidental left upper lung nodule. Plan to repeat imaging study in 3 months.      09/06/2014 Imaging    Bone survey showed no evidence of fracture      09/14/2014 Bone Marrow Biopsy    Bone marrow biopsy showed 8% residual plasma cells by manual count but none on the biopsy specimen      09/26/2014 -  Chemotherapy    She is started on cycle 1 of maintenance Revlimid      01/31/2015 Imaging     chest x-ray showed pneumonia. Treatment was placed on hold.      05/03/2015 Bone Marrow Biopsy    Accession: [id number] repeat bone marrow aspirate and biopsy show 5% residual plasma cells      10/14/2016 Bone Marrow Biopsy    Bone marrow biopsy showed the plasma cells represent 4% of all cells with lack of large aggregates or sheets. To assess the plasma cell clonality, immunohistochemical stains is performed and it lack clonality. Normal  cytogenetics and FISH      01/09/2017 Imaging    CT chest showed ground-glass 1.5 cm apical left upper lobe pulmonary nodule. Initial follow-up with CT at 6-12 months is recommended to confirm persistence. If persistent, repeat CT is recommended every 2 years until 5 years of stability has been established. This recommendation follows the consensus statement: Guidelines for Management of Incidental Pulmonary Nodules Detected on CT Images: From the Fleischner Society 2017; Radiology 2017; 284:228-243. 2. Mild patchy ground-glass opacities in the right upper lobe, probably inflammatory, which can also be reassessed on follow-up chest CT performed for the above dominant ground-glass nodule. 3. Solitary 3 mm right lower lobe solid pulmonary nodule, which can also be reassessed on follow-up chest CT performed for the above dominant ground-glass nodule. 4. No thoracic adenopathy. 5. Aortic atherosclerosis. Two-vessel coronary atherosclerosis.       INTERVAL HISTORY: Please see below for problem oriented charting. She returns with her husband. She has recovered fully from recent influenza infection. No further cough, fever or chills. She continues to have chronic severe pain in her back. She has to stop intermittently walking long distance due to dyspnea. She is able to swim. The patient denies any recent signs or symptoms of bleeding such as spontaneous epistaxis, hematuria or hematochezia. The current prescription pain medicine is adequate to control  the pain  REVIEW OF SYSTEMS:   Constitutional: Denies fevers, chills or abnormal weight loss Eyes: Denies blurriness of vision Ears, nose, mouth, throat, and face: Denies mucositis or sore throat Respiratory: Denies cough, dyspnea or wheezes Cardiovascular: Denies palpitation, chest discomfort or lower extremity swelling Gastrointestinal:  Denies nausea, heartburn or change in bowel habits Skin: Denies abnormal skin rashes Lymphatics: Denies new  lymphadenopathy or easy bruising Neurological:Denies numbness, tingling or new weaknesses Behavioral/Psych: Mood is stable, no new changes  All other systems were reviewed with the patient and are negative.  I have reviewed the past medical history, past surgical history, social history and family history with the patient and they are unchanged from previous note.  ALLERGIES:  has No Known Allergies.  MEDICATIONS:  Current Outpatient Prescriptions  Medication Sig Dispense Refill  . carboxymethylcellulose (REFRESH PLUS) 0.5 % SOLN Place 1 drop into both eyes daily as needed (for dry eyes).     . Cholecalciferol (VITAMIN D3) 2000 UNITS TABS Take 2,000 Units by mouth daily.    . hydrochlorothiazide (HYDRODIURIL) 25 MG tablet Take 25 mg by mouth every evening.     Marland Kitchen lenalidomide (REVLIMID) 5 MG capsule Take 1 capsule every other day for 21 days, off 7 days. Repeat every 28 days. 8 capsule 0  . levothyroxine (SYNTHROID, LEVOTHROID) 75 MCG tablet Take 75 mcg by mouth daily before breakfast.    . Multiple Vitamins-Minerals (CENTRUM SILVER PO) Take 1 tablet by mouth daily.     . ONGLYZA 5 MG TABS tablet Take 5 mg by mouth daily.     Marland Kitchen oxyCODONE (ROXICODONE) 15 MG immediate release tablet Take 1 tablet (15 mg total) by mouth 3 (three) times daily as needed for pain. 90 tablet 0  . polyethylene glycol (MIRALAX / GLYCOLAX) packet Take 17 g by mouth daily as needed for mild constipation.     . predniSONE (DELTASONE) 20 MG tablet Take 1 tablet (20 mg total) by mouth daily. 5 tablet 0  . RESTASIS 0.05 % ophthalmic emulsion PLACE 1 DROP IN BOTH EYES IN THE EVENING  4  . traMADol (ULTRAM) 50 MG tablet Take 1 tablet (50 mg total) by mouth every 6 (six) hours as needed for moderate pain. 90 tablet 0  . traZODone (DESYREL) 50 MG tablet TAKE 1 TABLET (50 MG TOTAL) BY MOUTH AT BEDTIME. 30 tablet 6  . venlafaxine XR (EFFEXOR-XR) 150 MG 24 hr capsule Take 1 capsule (150 mg total) by mouth daily with breakfast. 90  capsule 1  . verapamil (VERELAN PM) 120 MG 24 hr capsule Take 1 capsule by mouth  daily 30 capsule 11  . vitamin E (VITAMIN E) 1000 UNIT capsule Take 400 Units by mouth daily.      No current facility-administered medications for this visit.    Facility-Administered Medications Ordered in Other Visits  Medication Dose Route Frequency Provider Last Rate Last Dose  . gadopentetate dimeglumine (MAGNEVIST) injection 15 mL  15 mL Intravenous Once PRN Melvenia Beam, MD        PHYSICAL EXAMINATION: ECOG PERFORMANCE STATUS: 1 - Symptomatic but completely ambulatory  Vitals:   01/13/17 0835  BP: 132/76  Pulse: 64  Resp: 20  Temp: 98.4 F (36.9 C)   Filed Weights   01/13/17 0835  Weight: 166 lb 11.2 oz (75.6 kg)    GENERAL:alert, no distress and comfortable SKIN: skin color, texture, turgor are normal, no rashes or significant lesions EYES: normal, Conjunctiva are pink and non-injected, sclera clear OROPHARYNX:no exudate,  no erythema and lips, buccal mucosa, and tongue normal  NECK: supple, thyroid normal size, non-tender, without nodularity LYMPH:  no palpable lymphadenopathy in the cervical, axillary or inguinal LUNGS: clear to auscultation and percussion with normal breathing effort HEART: regular rate & rhythm and no murmurs and no lower extremity edema ABDOMEN:abdomen soft, non-tender and normal bowel sounds Musculoskeletal:no cyanosis of digits and no clubbing  NEURO: alert & oriented x 3 with fluent speech, no focal motor/sensory deficits  LABORATORY DATA:  I have reviewed the data as listed    Component Value Date/Time   NA 141 01/06/2017 1020   K 3.8 01/06/2017 1020   CL 105 10/14/2016 0835   CO2 28 01/06/2017 1020   GLUCOSE 86 01/06/2017 1020   BUN 11.2 01/06/2017 1020   CREATININE 0.8 01/06/2017 1020   CALCIUM 9.6 01/06/2017 1020   PROT 7.0 01/06/2017 1033   PROT 7.4 01/06/2017 1020   ALBUMIN 3.5 01/06/2017 1020   AST 20 01/06/2017 1020   ALT 36 01/06/2017  1020   ALKPHOS 59 01/06/2017 1020   BILITOT 0.32 01/06/2017 1020   GFRNONAA >60 10/14/2016 0835   GFRAA >60 10/14/2016 0835    No results found for: SPEP, UPEP  Lab Results  Component Value Date   WBC 3.5 (L) 01/06/2017   NEUTROABS 1.4 (L) 01/06/2017   HGB 11.8 01/06/2017   HCT 37.7 01/06/2017   MCV 74.0 (L) 01/06/2017   PLT 288 01/06/2017      Chemistry      Component Value Date/Time   NA 141 01/06/2017 1020   K 3.8 01/06/2017 1020   CL 105 10/14/2016 0835   CO2 28 01/06/2017 1020   BUN 11.2 01/06/2017 1020   CREATININE 0.8 01/06/2017 1020      Component Value Date/Time   CALCIUM 9.6 01/06/2017 1020   ALKPHOS 59 01/06/2017 1020   AST 20 01/06/2017 1020   ALT 36 01/06/2017 1020   BILITOT 0.32 01/06/2017 1020       RADIOGRAPHIC STUDIES: I have personally reviewed the radiological images as listed and agreed with the findings in the report. Dg Chest 2 View  Result Date: 01/02/2017 CLINICAL DATA:  Follow-up nodule. EXAM: CHEST  2 VIEW COMPARISON:  12/25/2016. FINDINGS: Mediastinum and hilar structures are normal. Heart size normal. Faint questionable pulmonary nodule again noted on the right. This may represent overlapping vascular structures however to exclude a focal lesion nonenhanced chest CT suggested. No pleural effusion or pneumothorax. No acute bony abnormality. IMPRESSION: Faint questionable pulmonary nodule on the right again noted. This may represent overlying vascular structures however to exclude a focal lesion nonenhanced suggested . Electronically Signed   By: Marcello Moores  Register   On: 01/02/2017 09:48   Dg Chest 2 View  Result Date: 12/25/2016 CLINICAL DATA:  61 year old female with Posterior left chest pain shortness of breath fever and cough. multiple myeloma in remission. Initial encounter. EXAM: CHEST  2 VIEW COMPARISON:  Skeletal survey 10/13/2016. Chest radiographs 08/14/2016. FINDINGS: Stable and normal lung volumes. Normal cardiac size and mediastinal  contours. Visualized tracheal air column is within normal limits. There is a 10 mm nodular density projecting in the right mid lung partially over the posterior seventh rib (arrow). This somewhat resembles EKG button artifact. Otherwise the lungs are stable and clear with no pneumothorax or pleural effusion. Negative visible bowel gas pattern. Visualized osseous structures appear stable and within normal limits. IMPRESSION: 1. New right mid lung pulmonary nodule (10 mm) versus EKG button artifact. If no  EKG or other external object was in place at the time of this image then consider a follow-up Chest CT (noncontrast should suffice) versus repeat chest x-rays in several weeks to re-evaluate. 2. Otherwise stable and negative chest. Electronically Signed   By: Genevie Ann M.D.   On: 12/25/2016 08:33   Ct Chest Wo Contrast  Result Date: 01/09/2017 CLINICAL DATA:  Vaginal wall right mid pulmonary nodule on chest radiographs. History of multiple myeloma with ongoing oral chemotherapy. EXAM: CT CHEST WITHOUT CONTRAST TECHNIQUE: Multidetector CT imaging of the chest was performed following the standard protocol without IV contrast. COMPARISON:  01/02/2017 chest radiograph. FINDINGS: Cardiovascular: Normal heart size. No significant pericardial fluid/thickening. Left anterior descending and right coronary atherosclerosis. Atherosclerotic nonaneurysmal thoracic aorta. Normal caliber pulmonary arteries. There is a nonocclusive coarse calcification in the lumen of the right brachiocephalic vein. Mediastinum/Nodes: No discrete thyroid nodules. Unremarkable esophagus. No pathologically enlarged axillary, mediastinal or gross hilar lymph nodes, noting limited sensitivity for the detection of hilar adenopathy on this noncontrast study. Lungs/Pleura: No pneumothorax. No pleural effusion. Apical left upper lobe ground-glass 1.5 x 1.1 cm pulmonary nodule (series 5/ image 28). Vague patchy mild ground-glass opacities throughout the  periphery of the right upper lobe. Solid 3 mm pulmonary nodule in the superior segment right lower lobe (series 5/ image 63). No acute consolidative airspace disease, lung masses or additional significant pulmonary nodules. Upper abdomen: Simple 4.7 cm upper right renal cyst. Musculoskeletal: No aggressive appearing focal osseous lesions. Mild thoracic spondylosis. IMPRESSION: 1. Ground-glass 1.5 cm apical left upper lobe pulmonary nodule. Initial follow-up with CT at 6-12 months is recommended to confirm persistence. If persistent, repeat CT is recommended every 2 years until 5 years of stability has been established. This recommendation follows the consensus statement: Guidelines for Management of Incidental Pulmonary Nodules Detected on CT Images: From the Fleischner Society 2017; Radiology 2017; 284:228-243. 2. Mild patchy ground-glass opacities in the right upper lobe, probably inflammatory, which can also be reassessed on follow-up chest CT performed for the above dominant ground-glass nodule. 3. Solitary 3 mm right lower lobe solid pulmonary nodule, which can also be reassessed on follow-up chest CT performed for the above dominant ground-glass nodule. 4. No thoracic adenopathy. 5. Aortic atherosclerosis.  Two-vessel coronary atherosclerosis. Electronically Signed   By: Ilona Sorrel M.D.   On: 01/09/2017 09:05    ASSESSMENT & PLAN:  Multiple myeloma in remission [facility]) I reviewed test results with her at her husband. So far, her bone marrow biopsy and blood work confirmed complete remission. The patient is comfortable to remain on Revlimid indefinitely. She will continue treatment per research protocol She will continue calcium with vitamin D supplement along with aspirin for DVT prophylaxis. 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 I will see her back in 3 months for further review.  Lung nodule seen on imaging study The lung nodule does not look suspicious for  malignancy. I plan to repeat imaging study in 6 months, due in August 2018. She is not symptomatic  Drug-induced pancytopenia ([place]) This is likely due to recent treatment. The patient denies recent history of fevers, cough, chills, diarrhea or dysuria. She is asymptomatic from the leukopenia. I will observe for now.  I will continue the chemotherapy at current dose without dosage adjustment.  If the leukopenia gets progressive worse in the future, I might have to delay her treatment or adjust the chemotherapy dose per protocol   Cancer associated pain  She have chronic bone pain which started with a diagnosis of multiple myeloma. We discussed chronic pain management. MRI did suggest she had evidence of degenerative disc disease and spinal stenosis. After much discussion, the patient is comfortable to remain on chronic narcotic prescription. We discussed narcotic refill policy She is also quite disabled, unable to mobilize long distance due to this. I signed her requests for application for disability parking permit  Abnormal thyroid function test The thyroid function test had normalized recently. Continue medical management   No orders of the defined types were placed in this encounter.  All questions were answered. The patient knows to call the clinic with any problems, questions or concerns. No barriers to learning was detected. I spent 25 minutes counseling the patient face to face. The total time spent in the appointment was 30 minutes and more than 50% was on counseling and review of test results     Heath Lark, MD 01/13/2017 9:15 AM

## 2017-01-13 NOTE — Assessment & Plan Note (Signed)
The thyroid function test had normalized recently. Continue medical management

## 2017-01-13 NOTE — Assessment & Plan Note (Signed)
She have chronic bone pain which started with a diagnosis of multiple myeloma. We discussed chronic pain management. MRI did suggest she had evidence of degenerative disc disease and spinal stenosis. After much discussion, the patient is comfortable to remain on chronic narcotic prescription. We discussed narcotic refill policy She is also quite disabled, unable to mobilize long distance due to this. I signed her requests for application for disability parking permit

## 2017-01-13 NOTE — Assessment & Plan Note (Signed)
This is likely due to recent treatment. The patient denies recent history of fevers, cough, chills, diarrhea or dysuria. She is asymptomatic from the leukopenia. I will observe for now.  I will continue the chemotherapy at current dose without dosage adjustment.  If the leukopenia gets progressive worse in the future, I might have to delay her treatment or adjust the chemotherapy dose per protocol

## 2017-01-13 NOTE — Progress Notes (Signed)
01/13/2017 Patient in to clinic today for evaluation prior to beginning treatment Cycle 28 today. Patient returned completed cycle 27 Medication Calendar, confirming dosing at dose level -3, Revlimid 5mg  every other day, Days 1-21, however, patient did experience an interruption in dosing due to onset of influenza mid-cycle. After a one week break, treatment was resumed through Day 21. Patient reports ongoing complaints as noted in AE table and list below. Based on lab results review and history and physical by Dr. Alvy Bimler, patient meets criteria for continued treatment with Revlimid.  [name] BSN, RN, [place] 01/13/2017 1:09 PM  Adverse Event Log Study/Protocol: [facility] ECOG E1A11 Maintenance Cycles 25-27: 10/21/2016 - 01/13/2017 (end of Cycle 27 = 01/12/17) Event Grade Attribution to lenalidomide Cycle # Comments  Fatigue  Grade 2  Probable 25-27  Moderate, occasionally limiting ADLs  Sensory neuropathy  Grade 2 Unlikely 25-27 Moderate symptoms, unchanged from previous  Insomnia  Grade 2  Unlikely 25-27 Moderate difficulty, takes sleeping pill regularly  Neutrophil count decreased Grade 2  Definite 26-27     Dyspnea Grade 1 Unlikely 25 & 27 Associated with fatigue, influenza  Anemia Grade 1 Definite 25    Nausea Grade 1 Unlikely 26 Mild nausea, no vomiting  Fever Grade 1 Unlikely 27 Associated with influenza  Non-reportable AEs (grade): - Moderate pain (2) . Joint pain - ankle, wrist, hip, knee . Back pain . Bone pain . Rib pain . - Leukopenia (1-2) - Headaches, migraines - moderate (2) - Vertigo (1) - Fall (2) - Left-sided stiffness (2) - Dry eye (2) - Weight loss (1) - Influenza (2) [name] BSN, RN, CCRP 03/11/2017 8:55 AM

## 2017-01-14 ENCOUNTER — Telehealth: Payer: Self-pay | Admitting: *Deleted

## 2017-01-14 NOTE — Telephone Encounter (Signed)
"  Carla Perry with [facility] calling about this patient.  She has three Revlimid on hand from previous cycle she was unable to complete due to flu.  Patient says she only needs eight pills for this order.  What was faxed and sent eRx read the same quantity of eleven.  Could someone in the office give pharmacist an order for this quantity."    Call transferred to pharmacist.  This nurse provided quantity change.

## 2017-02-03 ENCOUNTER — Other Ambulatory Visit: Payer: Self-pay | Admitting: *Deleted

## 2017-02-03 DIAGNOSIS — C9001 Multiple myeloma in remission: Secondary | ICD-10-CM

## 2017-02-03 MED ORDER — LENALIDOMIDE 5 MG PO CAPS CTSU E1A11: ORAL_CAPSULE | ORAL | 0 refills | Status: DC

## 2017-02-03 NOTE — Telephone Encounter (Signed)
Revlimid refill called into [facility]

## 2017-02-05 ENCOUNTER — Telehealth: Payer: Self-pay

## 2017-02-05 DIAGNOSIS — C9001 Multiple myeloma in remission: Secondary | ICD-10-CM

## 2017-02-05 MED ORDER — LENALIDOMIDE 5 MG PO CAPS CTSU E1A11: ORAL_CAPSULE | ORAL | 0 refills | Status: DC

## 2017-02-05 NOTE — Telephone Encounter (Signed)
I am not aware she is taking Revlimid every other day Please do not amend any doses until you talk to [facility]

## 2017-02-05 NOTE — Telephone Encounter (Signed)
[facility] pharmacist with [facility] called asking for script for revlimid to be corrected. Quantity should be 11 and it was written for #21. Pt is taking every other day for 21 days and 7 days off.  Chart examined and Rx resent with #11.

## 2017-02-06 ENCOUNTER — Encounter: Payer: Self-pay | Admitting: Hematology and Oncology

## 2017-02-09 ENCOUNTER — Telehealth: Payer: Self-pay

## 2017-02-09 NOTE — Telephone Encounter (Signed)
Carla Perry called the patient to make her lab only appointment later tomorrow due to the weather.  Appointment moved.   Webb Silversmith

## 2017-02-10 ENCOUNTER — Other Ambulatory Visit: Payer: BLUE CROSS/BLUE SHIELD

## 2017-02-10 ENCOUNTER — Encounter: Payer: BLUE CROSS/BLUE SHIELD | Admitting: *Deleted

## 2017-02-10 ENCOUNTER — Encounter: Payer: Self-pay | Admitting: Hematology and Oncology

## 2017-02-10 ENCOUNTER — Other Ambulatory Visit (HOSPITAL_BASED_OUTPATIENT_CLINIC_OR_DEPARTMENT_OTHER): Payer: BLUE CROSS/BLUE SHIELD

## 2017-02-10 DIAGNOSIS — Z006 Encounter for examination for normal comparison and control in clinical research program: Secondary | ICD-10-CM

## 2017-02-10 DIAGNOSIS — C9001 Multiple myeloma in remission: Secondary | ICD-10-CM | POA: Diagnosis not present

## 2017-02-10 LAB — CBC WITH DIFFERENTIAL/PLATELET
BASO%: 0.2 % (ref 0.0–2.0)
Basophils Absolute: 0 10*3/uL (ref 0.0–0.1)
EOS%: 2.1 % (ref 0.0–7.0)
Eosinophils Absolute: 0.1 10*3/uL (ref 0.0–0.5)
HCT: 37.6 % (ref 34.8–46.6)
HGB: 11.7 g/dL (ref 11.6–15.9)
LYMPH%: 47 % (ref 14.0–49.7)
MCH: 22.8 pg — ABNORMAL LOW (ref 25.1–34.0)
MCHC: 31.1 g/dL — ABNORMAL LOW (ref 31.5–36.0)
MCV: 73.5 fL — ABNORMAL LOW (ref 79.5–101.0)
MONO#: 0.5 10*3/uL (ref 0.1–0.9)
MONO%: 13.4 % (ref 0.0–14.0)
NEUT#: 1.3 10*3/uL — ABNORMAL LOW (ref 1.5–6.5)
NEUT%: 37.3 % — ABNORMAL LOW (ref 38.4–76.8)
Platelets: 231 10*3/uL (ref 145–400)
RBC: 5.12 10*6/uL (ref 3.70–5.45)
RDW: 15.8 % — ABNORMAL HIGH (ref 11.2–14.5)
WBC: 3.4 10*3/uL — ABNORMAL LOW (ref 3.9–10.3)
lymph#: 1.6 10*3/uL (ref 0.9–3.3)

## 2017-02-10 MED ORDER — LENALIDOMIDE 5 MG PO CAPS CTSU E1A11: ORAL_CAPSULE | ORAL | 0 refills | Status: DC

## 2017-02-10 NOTE — Progress Notes (Signed)
02/10/2017 Patient in to clinic today accompanied by her husband, for lab visit and assessment prior to beginning treatment Cycle 29. Patient returned completed cycle 28 Medication Calendar, confirming dosing at dose level -3, Revlimid 5mg  every other day, Days 1-21. Patient reports ongoing complaints as noted in AE table and list below.Based on lab results review and history and physical by Dr. Alvy Bimler, patient meets criteria for continued treatment with Revlimid. Patient given printed appointment calendar with Cycle 29 treatment days marked, to document doses for the upcoming cycle, which will begin today, following confirmation of adequate blood counts. Patient instructed to await notification from research nurse before beginning new cycle of lenalidomide, once CBC results have been reviewed.  Attempted to reach patient by mobile phone, following review of lab results, however, voice mail box was invalid. Left message at patient's home phone number to resume treatment with lenalidomide today, as previously instructed. [name] BSN, RN, CCRP 02/10/2017 11:59 AM   02/10/2017 Spoke with patient by phone, confirming start of new treatment cycle, beginning today. [name] BSN, RN, [place] 02/10/2017 2:04 PM    Adverse Event Log Study/Protocol: [facility] ECOG E1A11 Maintenance Cycles 28: 01/13/2017 - 02/10/2017 (end of cycle = 02/09/17) Event Grade Comments  Fatigue  Grade 2  Moderate, occasionally limiting ADLs  Sensory neuropathy  Grade 2 Moderate symptoms, unchanged from previous  Insomnia  Grade 2  Moderate difficulty, takes sleeping pill regularly  Neutrophil count decreased Grade 2     Non-reportable AEs (grade): - Moderate pain (2) . Joint pain - ankle, wrist, hip, knee . Back pain . Bone pain . Muscle pain (legs) - Headaches, migraines - moderate (2)       - Leukopenia (1)  - Weakness, symptomatic to patient (1) [name] BSN, RN, CCRP 03/31/2017 1:57 PM

## 2017-02-12 ENCOUNTER — Encounter: Payer: Self-pay | Admitting: Hematology and Oncology

## 2017-02-16 ENCOUNTER — Encounter: Payer: Self-pay | Admitting: Hematology and Oncology

## 2017-02-23 ENCOUNTER — Telehealth: Payer: Self-pay | Admitting: *Deleted

## 2017-02-23 NOTE — Telephone Encounter (Signed)
I suggest she call us back in 2 days to report I can also give her long acting MS contin if not helpful

## 2017-02-23 NOTE — Telephone Encounter (Signed)
Pt reports she has been having worse pain in her lower back, hip and tailbone every day since she started this cycle of revlimid. Pain is not in her spine as suspected by scan. States she is using oxycodone 15 mg BID (AM and PM), but pain continues all day long. Discussed using TID as ordered to see if she gets any relief. She will try TID for now

## 2017-02-23 NOTE — Telephone Encounter (Signed)
Notified of message below. Will call us in a few days

## 2017-02-25 ENCOUNTER — Other Ambulatory Visit: Payer: Self-pay | Admitting: *Deleted

## 2017-02-25 DIAGNOSIS — C9001 Multiple myeloma in remission: Secondary | ICD-10-CM

## 2017-02-25 MED ORDER — LENALIDOMIDE 5 MG PO CAPS CTSU E1A11
ORAL_CAPSULE | ORAL | 0 refills | Status: DC
Start: 2017-02-25 — End: 2017-03-10

## 2017-02-25 MED ORDER — LENALIDOMIDE 5 MG PO CAPS CTSU E1A11: ORAL_CAPSULE | ORAL | 0 refills | Status: DC

## 2017-02-25 NOTE — Telephone Encounter (Signed)
Revlimid refill faxed to [facility]. # [phone]

## 2017-03-06 ENCOUNTER — Other Ambulatory Visit: Payer: Self-pay | Admitting: Hematology and Oncology

## 2017-03-06 MED ORDER — OXYCODONE HCL 15 MG PO TABS: 15.0000 mg | ORAL_TABLET | Freq: Three times a day (TID) | ORAL | 0 refills | Status: DC | PRN

## 2017-03-10 ENCOUNTER — Encounter: Payer: Self-pay | Admitting: Hematology and Oncology

## 2017-03-10 ENCOUNTER — Other Ambulatory Visit (HOSPITAL_BASED_OUTPATIENT_CLINIC_OR_DEPARTMENT_OTHER): Payer: BLUE CROSS/BLUE SHIELD

## 2017-03-10 ENCOUNTER — Encounter: Payer: BLUE CROSS/BLUE SHIELD | Admitting: *Deleted

## 2017-03-10 DIAGNOSIS — Z006 Encounter for examination for normal comparison and control in clinical research program: Secondary | ICD-10-CM

## 2017-03-10 DIAGNOSIS — C9001 Multiple myeloma in remission: Secondary | ICD-10-CM

## 2017-03-10 LAB — CBC WITH DIFFERENTIAL/PLATELET
BASO%: 0.3 % (ref 0.0–2.0)
Basophils Absolute: 0 10*3/uL (ref 0.0–0.1)
EOS%: 2.9 % (ref 0.0–7.0)
Eosinophils Absolute: 0.1 10*3/uL (ref 0.0–0.5)
HCT: 36.6 % (ref 34.8–46.6)
HGB: 11.5 g/dL — ABNORMAL LOW (ref 11.6–15.9)
LYMPH%: 54.4 % — ABNORMAL HIGH (ref 14.0–49.7)
MCH: 22.9 pg — ABNORMAL LOW (ref 25.1–34.0)
MCHC: 31.6 g/dL (ref 31.5–36.0)
MCV: 72.6 fL — ABNORMAL LOW (ref 79.5–101.0)
MONO#: 0.5 10*3/uL (ref 0.1–0.9)
MONO%: 13.7 % (ref 0.0–14.0)
NEUT#: 1 10*3/uL — ABNORMAL LOW (ref 1.5–6.5)
NEUT%: 28.7 % — ABNORMAL LOW (ref 38.4–76.8)
Platelets: 197 10*3/uL (ref 145–400)
RBC: 5.03 10*6/uL (ref 3.70–5.45)
RDW: 15.5 % — ABNORMAL HIGH (ref 11.2–14.5)
WBC: 3.6 10*3/uL — ABNORMAL LOW (ref 3.9–10.3)
lymph#: 1.9 10*3/uL (ref 0.9–3.3)

## 2017-03-10 MED ORDER — LENALIDOMIDE 5 MG PO CAPS CTSU E1A11: ORAL_CAPSULE | ORAL | 0 refills | Status: DC

## 2017-03-10 NOTE — Progress Notes (Signed)
03/10/2017 Patient in to clinic today for hematology evaluation prior to receiving treatment cycle 30. Patient reports ongoing pain as previously noted, as well as moderate fatigue and difficulty sleeping (takes sleeping pill). Her neuropathy symptoms are unchanged. She reports improved pain control of lower back and tailbone pain by taking 3 pain pills daily. Patient returned completed cycle 29 Medication Calendar, confirming correct dosing as prescribed(dose level -3, Revlimid 5mg  every other day, Days 1-21).Patient given printed appointment calendar with Cycle 30 treatment days marked, to document doses for the upcoming cycle, which will begin today, following confirmation of adequate blood counts. Patient given instructions to take Revlimid once every other day, beginning today, and ending on Day 21, Patient instructed to await notification from research nurse before beginning new cycle of lenalidomide, once CBC results have been reviewed. Per Dr. Alvy Bimler, lab results are acceptable for continued treatment, with a new cycle beginning today. Spoke with patient by phone to let her know that lab test results were acceptable and that patient should begin dosing with lenalidomide today as instructed. Patient verbalized understanding. [name] BSN, RN, CCRP 03/10/2017 9:25 AM  Adverse Event Log Study/Protocol: [facility] ECOG E1A11 Maintenance Cycle 29: 02/10/2017 - 03/10/2017 (end of cycle = 03/09/17) Event Grade Comments  Fatigue  Grade 2  Moderate, occasionally limiting ADLs  Sensory neuropathy  Grade 2 Moderate symptoms, unchanged from previous  Insomnia  Grade 2  Moderate difficulty, takes sleeping pill regularly  Neutrophil count decreased Grade 2     Anemia Grade 1   Nausea Grade 1 Mild nausea x 1 day  Non-reportable AEs (grade): - Moderate pain (2) . Joint pain - ankle, wrist, hip (worsened last week of March, not severe), knee . Back pain - worsened last week of March, but not severe . Bone  pain . Tailbone pain . Generalized pain . Extremity pain - leg - Headaches, migraines - moderate (2)       - Leukopenia (1)  [name] BSN, RN, CCRP 03/31/2017 4:02 PM

## 2017-03-27 ENCOUNTER — Other Ambulatory Visit: Payer: Self-pay | Admitting: Medical Oncology

## 2017-03-27 DIAGNOSIS — C9001 Multiple myeloma in remission: Secondary | ICD-10-CM

## 2017-03-27 MED ORDER — LENALIDOMIDE 5 MG PO CAPS CTSU E1A11: ORAL_CAPSULE | ORAL | 0 refills | Status: DC

## 2017-03-31 ENCOUNTER — Other Ambulatory Visit (HOSPITAL_BASED_OUTPATIENT_CLINIC_OR_DEPARTMENT_OTHER): Payer: BLUE CROSS/BLUE SHIELD

## 2017-03-31 DIAGNOSIS — Z006 Encounter for examination for normal comparison and control in clinical research program: Secondary | ICD-10-CM

## 2017-03-31 DIAGNOSIS — C9001 Multiple myeloma in remission: Secondary | ICD-10-CM | POA: Diagnosis not present

## 2017-03-31 LAB — COMPREHENSIVE METABOLIC PANEL
ALT: 22 U/L (ref 0–55)
AST: 22 U/L (ref 5–34)
Albumin: 3.7 g/dL (ref 3.5–5.0)
Alkaline Phosphatase: 52 U/L (ref 40–150)
Anion Gap: 8 mEq/L (ref 3–11)
BUN: 12 mg/dL (ref 7.0–26.0)
CO2: 28 mEq/L (ref 22–29)
Calcium: 9.3 mg/dL (ref 8.4–10.4)
Chloride: 104 mEq/L (ref 98–109)
Creatinine: 0.9 mg/dL (ref 0.6–1.1)
EGFR: 77 mL/min/{1.73_m2} — ABNORMAL LOW (ref >90)
Glucose: 93 mg/dl (ref 70–140)
Potassium: 3.6 mEq/L (ref 3.5–5.1)
Sodium: 140 mEq/L (ref 136–145)
Total Bilirubin: 0.53 mg/dL (ref 0.20–1.20)
Total Protein: 7.7 g/dL (ref 6.4–8.3)

## 2017-03-31 LAB — CBC WITH DIFFERENTIAL/PLATELET
BASO%: 0.6 % (ref 0.0–2.0)
Basophils Absolute: 0 10*3/uL (ref 0.0–0.1)
EOS%: 3.5 % (ref 0.0–7.0)
Eosinophils Absolute: 0.1 10*3/uL (ref 0.0–0.5)
HCT: 38.9 % (ref 34.8–46.6)
HGB: 11.9 g/dL (ref 11.6–15.9)
LYMPH%: 45.4 % (ref 14.0–49.7)
MCH: 22.6 pg — ABNORMAL LOW (ref 25.1–34.0)
MCHC: 30.6 g/dL — ABNORMAL LOW (ref 31.5–36.0)
MCV: 74 fL — ABNORMAL LOW (ref 79.5–101.0)
MONO#: 0.5 10*3/uL (ref 0.1–0.9)
MONO%: 14.2 % — ABNORMAL HIGH (ref 0.0–14.0)
NEUT#: 1.2 10*3/uL — ABNORMAL LOW (ref 1.5–6.5)
NEUT%: 36.3 % — ABNORMAL LOW (ref 38.4–76.8)
Platelets: 207 10*3/uL (ref 145–400)
RBC: 5.26 10*6/uL (ref 3.70–5.45)
RDW: 15.4 % — ABNORMAL HIGH (ref 11.2–14.5)
WBC: 3.2 10*3/uL — ABNORMAL LOW (ref 3.9–10.3)
lymph#: 1.4 10*3/uL (ref 0.9–3.3)

## 2017-03-31 LAB — LACTATE DEHYDROGENASE: LDH: 190 U/L (ref 125–245)

## 2017-03-31 LAB — TSH: TSH: 1.014 m(IU)/L (ref 0.308–3.960)

## 2017-04-01 LAB — KAPPA/LAMBDA LIGHT CHAINS
Ig Kappa Free Light Chain: 23.8 mg/L — ABNORMAL HIGH (ref 3.3–19.4)
Ig Lambda Free Light Chain: 19 mg/L (ref 5.7–26.3)
Kappa/Lambda FluidC Ratio: 1.25 (ref 0.26–1.65)

## 2017-04-06 LAB — MULTIPLE MYELOMA PANEL, SERUM
Albumin SerPl Elph-Mcnc: 3.5 g/dL (ref 2.9–4.4)
Albumin/Glob SerPl: 1.1 (ref 0.7–1.7)
Alpha 1: 0.2 g/dL (ref 0.0–0.4)
Alpha2 Glob SerPl Elph-Mcnc: 0.5 g/dL (ref 0.4–1.0)
B-Globulin SerPl Elph-Mcnc: 1 g/dL (ref 0.7–1.3)
Gamma Glob SerPl Elph-Mcnc: 1.8 g/dL (ref 0.4–1.8)
Globulin, Total: 3.5 g/dL (ref 2.2–3.9)
IgA, Qn, Serum: 135 mg/dL (ref 87–352)
IgG, Qn, Serum: 1555 mg/dL (ref 700–1600)
IgM, Qn, Serum: 65 mg/dL (ref 26–217)
Total Protein: 7 g/dL (ref 6.0–8.5)

## 2017-04-07 ENCOUNTER — Encounter: Payer: Self-pay | Admitting: Hematology and Oncology

## 2017-04-07 ENCOUNTER — Encounter: Payer: BLUE CROSS/BLUE SHIELD | Admitting: *Deleted

## 2017-04-07 ENCOUNTER — Telehealth: Payer: Self-pay | Admitting: Hematology and Oncology

## 2017-04-07 ENCOUNTER — Ambulatory Visit (HOSPITAL_BASED_OUTPATIENT_CLINIC_OR_DEPARTMENT_OTHER): Payer: BLUE CROSS/BLUE SHIELD | Admitting: Hematology and Oncology

## 2017-04-07 VITALS — BP 117/58 | HR 78 | Temp 98.5°F | Resp 18 | Ht 67.0 in | Wt 169.6 lb

## 2017-04-07 DIAGNOSIS — C9001 Multiple myeloma in remission: Secondary | ICD-10-CM | POA: Diagnosis not present

## 2017-04-07 DIAGNOSIS — G893 Neoplasm related pain (acute) (chronic): Secondary | ICD-10-CM

## 2017-04-07 DIAGNOSIS — R911 Solitary pulmonary nodule: Secondary | ICD-10-CM | POA: Diagnosis not present

## 2017-04-07 DIAGNOSIS — D61811 Other drug-induced pancytopenia: Secondary | ICD-10-CM

## 2017-04-07 DIAGNOSIS — Z006 Encounter for examination for normal comparison and control in clinical research program: Secondary | ICD-10-CM

## 2017-04-07 DIAGNOSIS — C9000 Multiple myeloma not having achieved remission: Secondary | ICD-10-CM

## 2017-04-07 DIAGNOSIS — D61810 Antineoplastic chemotherapy induced pancytopenia: Secondary | ICD-10-CM | POA: Diagnosis not present

## 2017-04-07 MED ORDER — LENALIDOMIDE 5 MG PO CAPS CTSU E1A11: ORAL_CAPSULE | ORAL | 0 refills | Status: DC

## 2017-04-07 MED ORDER — OXYCODONE HCL 15 MG PO TABS: 15.0000 mg | ORAL_TABLET | Freq: Three times a day (TID) | ORAL | 0 refills | Status: DC | PRN

## 2017-04-07 MED ORDER — TRAMADOL HCL 50 MG PO TABS: 50.0000 mg | ORAL_TABLET | Freq: Four times a day (QID) | ORAL | 0 refills | Status: DC | PRN

## 2017-04-07 NOTE — Telephone Encounter (Signed)
Gave patient AVS and calender per 5/8 los.  

## 2017-04-07 NOTE — Assessment & Plan Note (Signed)
This is likely due to recent treatment. The patient denies recent history of fevers, cough, chills, diarrhea or dysuria. She is asymptomatic from the leukopenia. I will observe for now.  I will continue the chemotherapy at current dose without dosage adjustment.  If the leukopenia gets progressive worse in the future, I might have to delay her treatment or adjust the chemotherapy dose per protocol

## 2017-04-07 NOTE — Assessment & Plan Note (Signed)
She have chronic bone pain which started with a diagnosis of multiple myeloma. We discussed chronic pain management. MRI did suggest she had evidence of degenerative disc disease and spinal stenosis. After much discussion, the patient is comfortable to remain on chronic narcotic prescription. I refilled her prescriptions We discussed narcotic refill policy 

## 2017-04-07 NOTE — Progress Notes (Signed)
04/07/2017 Patient in to clinic today for evaluation prior to beginning treatment Cycle 31 today. Upon arrival to clinic, patient completed her patient reported outcomes questionnaires independently. Based on lab results review from 03/31/2017 collection, 24-hour urine will be collected to confirm CR status. Patient to obtain collection jug today and return sample this week. Patient reports ongoing complaints as noted in AE table and list below. She states that pain now seems to be well-controlled by taking 3 pain pills per day. She feels like episode of diarrhea that occurred this past Sunday and Monday was related to having eaten some spinach. Based on lab results review and history and physical by Dr. Alvy Bimler, patient meets criteria for continued treatment with Revlimid. Patient returned completed cycle 30 Medication Calendar, confirming dosing at dose level -3, Revlimid 5mg  every other day, Days 1-21. Patient given printed appointment calendar with Cycle 31 treatment days marked, to document doses for the next treatment cycle, which will begin today. [name] BSN, RN, CCRP 04/07/2017 9:50 AM   Adverse Event Log Study/Protocol: [facility] ECOG E1A11 Maintenance Cycles 28-30: 01/13/2017 - 04/07/2017 (end of Cycle 30= 04/06/17) Event Grade Attribution to lenalidomide Cycle # Comments  Fatigue  Grade 2  Probable 28-30 Moderate, occasionally limiting ADLs  Sensory neuropathy  Grade 2 Unlikely 28-30 Moderate symptoms, unchanged from previous  Insomnia  Grade 2  Unlikely 28-30 Moderate difficulty, takes sleeping pill regularly  Neutrophil count decreased Grade 2  Definite 28-30    Anemia Grade 1 Definite 29    Nausea Grade 1 Possible 29 Mild nausea, no vomiting  Diarrhea Grade 1 Probable 30   Non-reportable AEs (grade): - Moderate pain (2) . Joint pain - ankle, wrist, hip, knee . Back pain . Bone pain . Muscle pain/legs . Tailbone pain . Generalized pain . Extremity pain - Leukopenia (1) - Headaches,  migraines - moderate (2) - Weakness (1) - Weight loss (1) [name] BSN, RN, [place] 05/15/2017 11:16 AM

## 2017-04-07 NOTE — Progress Notes (Signed)
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      05/05/2014 Imaging    Imaging study of the neck showed no explanation that could cause right neck pain. She is noted to have incidental left upper lung nodule. Plan to repeat imaging study in 3 months.      09/06/2014 Imaging    Bone survey showed no evidence of fracture      09/14/2014 Bone Marrow Biopsy    Bone marrow biopsy showed 8% residual plasma cells by manual count but none on the biopsy specimen      09/26/2014 -  Chemotherapy    She is started on cycle 1 of maintenance Revlimid      01/31/2015 Imaging     chest x-ray showed pneumonia. Treatment was placed on hold.      05/03/2015 Bone Marrow Biopsy    Accession: [id number] repeat bone marrow aspirate and biopsy show 5% residual plasma cells      10/14/2016 Bone Marrow Biopsy    Bone marrow biopsy showed the plasma cells represent 4% of all cells with lack of large aggregates or sheets. To assess the plasma cell clonality, immunohistochemical stains is performed and it lack clonality. Normal  cytogenetics and FISH      01/09/2017 Imaging    CT chest showed ground-glass 1.5 cm apical left upper lobe pulmonary nodule. Initial follow-up with CT at 6-12 months is recommended to confirm persistence. If persistent, repeat CT is recommended every 2 years until 5 years of stability has been established. This recommendation follows the consensus statement: Guidelines for Management of Incidental Pulmonary Nodules Detected on CT Images: From the Fleischner Society 2017; Radiology 2017; 284:228-243. 2. Mild patchy ground-glass opacities in the right upper lobe, probably inflammatory, which can also be reassessed on follow-up chest CT performed for the above dominant ground-glass nodule. 3. Solitary 3 mm right lower lobe solid pulmonary nodule, which can also be reassessed on follow-up chest CT performed for the above dominant ground-glass nodule. 4. No thoracic adenopathy. 5. Aortic atherosclerosis. Two-vessel coronary atherosclerosis.       INTERVAL HISTORY: Please see below for problem oriented charting. She returns for further follow-up Her bone pain is stable with current prescription pain medicine She denies recent cough, chest pain or infections She is active and try to exercise on a regular basis She continues to have intermittent diarrhea, stable  REVIEW OF SYSTEMS:   Constitutional: Denies fevers, chills or abnormal weight loss Eyes: Denies blurriness of vision Ears, nose, mouth, throat, and face: Denies mucositis or sore throat  Respiratory: Denies cough, dyspnea or wheezes Cardiovascular: Denies palpitation, chest discomfort or lower extremity swelling Gastrointestinal:  Denies nausea, heartburn or change in bowel habits Skin: Denies abnormal skin rashes Lymphatics: Denies new lymphadenopathy or easy bruising Neurological:Denies numbness, tingling or new weaknesses Behavioral/Psych: Mood is stable, no new changes  All other systems were reviewed with the patient and are  negative.  I have reviewed the past medical history, past surgical history, social history and family history with the patient and they are unchanged from previous note.  ALLERGIES:  has No Known Allergies.  MEDICATIONS:  Current Outpatient Prescriptions  Medication Sig Dispense Refill  . aspirin 325 MG tablet Take 1 tablet (325 mg total) by mouth daily.    . carboxymethylcellulose (REFRESH PLUS) 0.5 % SOLN Place 1 drop into both eyes daily as needed (for dry eyes).     . Cholecalciferol (VITAMIN D3) 2000 UNITS TABS Take 2,000 Units by mouth daily.    . hydrochlorothiazide (HYDRODIURIL) 25 MG tablet Take 25 mg by mouth every evening.     Marland Kitchen levothyroxine (SYNTHROID, LEVOTHROID) 75 MCG tablet Take 75 mcg by mouth daily before breakfast.    . Multiple Vitamins-Minerals (CENTRUM SILVER PO) Take 1 tablet by mouth daily.     . ONGLYZA 5 MG TABS tablet Take 5 mg by mouth daily.     Marland Kitchen oxyCODONE (ROXICODONE) 15 MG immediate release tablet Take 1 tablet (15 mg total) by mouth 3 (three) times daily as needed for pain. 90 tablet 0  . polyethylene glycol (MIRALAX / GLYCOLAX) packet Take 17 g by mouth daily as needed for mild constipation.     . RESTASIS 0.05 % ophthalmic emulsion PLACE 1 DROP IN BOTH EYES IN THE EVENING  4  . traMADol (ULTRAM) 50 MG tablet Take 1 tablet (50 mg total) by mouth every 6 (six) hours as needed for moderate pain. 90 tablet 0  . traZODone (DESYREL) 50 MG tablet TAKE 1 TABLET (50 MG TOTAL) BY MOUTH AT BEDTIME. 30 tablet 6  . venlafaxine XR (EFFEXOR-XR) 150 MG 24 hr capsule Take 1 capsule (150 mg total) by mouth daily with breakfast. 90 capsule 1  . verapamil (VERELAN PM) 120 MG 24 hr capsule Take 1 capsule by mouth  daily 30 capsule 11  . vitamin E (VITAMIN E) 1000 UNIT capsule Take 400 Units by mouth daily.     Marland Kitchen lenalidomide (REVLIMID) 5 MG capsule Take 1 capsule every other day for 21 days, off 7 days. Repeat every 28 days. 11 capsule 0   No current facility-administered  medications for this visit.    Facility-Administered Medications Ordered in Other Visits  Medication Dose Route Frequency Provider Last Rate Last Dose  . gadopentetate dimeglumine (MAGNEVIST) injection 15 mL  15 mL Intravenous Once PRN Melvenia Beam, MD        PHYSICAL EXAMINATION: ECOG PERFORMANCE STATUS: 1 - Symptomatic but completely ambulatory  Vitals:   04/07/17 0843  BP: (!) 117/58  Pulse: 78  Resp: 18  Temp: 98.5 F (36.9 C)   Filed Weights   04/07/17 0843  Weight: 169 lb 9.6 oz (76.9 kg)    GENERAL:alert, no distress and comfortable SKIN: skin color, texture, turgor are normal, no rashes or significant lesions EYES: normal, Conjunctiva are pink and non-injected, sclera clear OROPHARYNX:no exudate, no erythema and lips, buccal mucosa, and tongue normal  NECK: supple, thyroid normal size, non-tender, without nodularity LYMPH:  no palpable lymphadenopathy in the cervical, axillary or inguinal LUNGS: clear to auscultation  and percussion with normal breathing effort HEART: regular rate & rhythm and no murmurs and no lower extremity edema ABDOMEN:abdomen soft, non-tender and normal bowel sounds Musculoskeletal:no cyanosis of digits and no clubbing  NEURO: alert & oriented x 3 with fluent speech, no focal motor/sensory deficits  LABORATORY DATA:  I have reviewed the data as listed    Component Value Date/Time   NA 140 03/31/2017 0752   K 3.6 03/31/2017 0752   CL 105 10/14/2016 0835   CO2 28 03/31/2017 0752   GLUCOSE 93 03/31/2017 0752   BUN 12.0 03/31/2017 0752   CREATININE 0.9 03/31/2017 0752   CALCIUM 9.3 03/31/2017 0752   PROT 7.0 03/31/2017 0753   PROT 7.7 03/31/2017 0752   ALBUMIN 3.7 03/31/2017 0752   AST 22 03/31/2017 0752   ALT 22 03/31/2017 0752   ALKPHOS 52 03/31/2017 0752   BILITOT 0.53 03/31/2017 0752   GFRNONAA >60 10/14/2016 0835   GFRAA >60 10/14/2016 0835    No results found for: SPEP, UPEP  Lab Results  Component Value Date   WBC 3.2  (L) 03/31/2017   NEUTROABS 1.2 (L) 03/31/2017   HGB 11.9 03/31/2017   HCT 38.9 03/31/2017   MCV 74.0 (L) 03/31/2017   PLT 207 03/31/2017      Chemistry      Component Value Date/Time   NA 140 03/31/2017 0752   K 3.6 03/31/2017 0752   CL 105 10/14/2016 0835   CO2 28 03/31/2017 0752   BUN 12.0 03/31/2017 0752   CREATININE 0.9 03/31/2017 0752      Component Value Date/Time   CALCIUM 9.3 03/31/2017 0752   ALKPHOS 52 03/31/2017 0752   AST 22 03/31/2017 0752   ALT 22 03/31/2017 0752   BILITOT 0.53 03/31/2017 0752      ASSESSMENT & PLAN:  Multiple myeloma in remission ([place]) I reviewed test results with her  So far, her last bone marrow biopsy and recent blood work confirmed complete remission. The patient is comfortable to remain on Revlimid indefinitely. She will continue treatment per research protocol She will continue calcium with vitamin D supplement along with aspirin for DVT prophylaxis. 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 I will see her back in 3 months for further review.  Drug-induced pancytopenia ([place]) This is likely due to recent treatment. The patient denies recent history of fevers, cough, chills, diarrhea or dysuria. She is asymptomatic from the leukopenia. I will observe for now.  I will continue the chemotherapy at current dose without dosage adjustment.  If the leukopenia gets progressive worse in the future, I might have to delay her treatment or adjust the chemotherapy dose per protocol   Cancer associated pain  She have chronic bone pain which started with a diagnosis of multiple myeloma. We discussed chronic pain management. MRI did suggest she had evidence of degenerative disc disease and spinal stenosis. After much discussion, the patient is comfortable to remain on chronic narcotic prescription. I refilled her prescriptions We discussed narcotic refill policy  Lung nodule seen on imaging study Previous imaging  detected lung nodules It did not look suspicious for malignancy. I plan to repeat imaging study in 6 months, due in August 2018. She is not symptomatic   Orders Placed This Encounter  Procedures  . CT CHEST WO CONTRAST    Standing Status:   Future    Standing Expiration Date:   06/07/2018    Order Specific Question:   Reason  for Exam (SYMPTOM  OR DIAGNOSIS REQUIRED)    Answer:   lung nodules on CT    Order Specific Question:   Is the patient pregnant?    Answer:   No    Order Specific Question:   Preferred imaging location?    Answer:   [facility]   All questions were answered. The patient knows to call the clinic with any problems, questions or concerns. No barriers to learning was detected. I spent 15 minutes counseling the patient face to face. The total time spent in the appointment was 20 minutes and more than 50% was on counseling and review of test results     Heath Lark, MD 04/07/2017 9:44 AM

## 2017-04-07 NOTE — Assessment & Plan Note (Addendum)
Previous imaging detected lung nodules It did not look suspicious for malignancy. I plan to repeat imaging study in 6 months, due in August 2018. She is not symptomatic

## 2017-04-07 NOTE — Assessment & Plan Note (Signed)
I reviewed test results with her  So far, her last bone marrow biopsy and recent blood work confirmed complete remission. The patient is comfortable to remain on Revlimid indefinitely. She will continue treatment per research protocol She will continue calcium with vitamin D supplement along with aspirin for DVT prophylaxis. 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 I will see her back in 3 months for further review.

## 2017-04-10 ENCOUNTER — Other Ambulatory Visit: Payer: Self-pay

## 2017-04-10 ENCOUNTER — Ambulatory Visit: Payer: BLUE CROSS/BLUE SHIELD

## 2017-04-10 DIAGNOSIS — C9001 Multiple myeloma in remission: Secondary | ICD-10-CM

## 2017-04-14 LAB — UPEP/TP, 24-HR URINE
Albumin, U: 100 %
Alpha-1-Globulin, U: 0 %
Alpha-2-Globulin, U: 0 %
Beta Globulin, U: 0 %
Gamma Globulin, U: 0 %

## 2017-04-15 LAB — UIFE/LIGHT CHAINS/TP QN, 24-HR UR
FR KAPPA LT CH,24HR: 25 mg/24 hr
FR LAMBDA LT CH,24HR: 1 mg/24 hr
Free Kappa Lt Chains,Ur: 11.5 mg/L (ref 1.35–24.19)
Free Lambda Lt Chains,Ur: 0.37 mg/L (ref 0.24–6.66)
Kappa/Lambda Ratio,U: 31.08 — ABNORMAL HIGH (ref 2.04–10.37)
PROTEIN,TOTAL,URINE: 5.9 mg/dL
Prot,24hr calculated: 130 mg/24 hr (ref 30–150)

## 2017-04-22 ENCOUNTER — Encounter: Payer: Self-pay | Admitting: Hematology and Oncology

## 2017-04-24 ENCOUNTER — Other Ambulatory Visit: Payer: Self-pay | Admitting: *Deleted

## 2017-04-24 DIAGNOSIS — C9001 Multiple myeloma in remission: Secondary | ICD-10-CM

## 2017-04-24 MED ORDER — LENALIDOMIDE 5 MG PO CAPS: ORAL_CAPSULE | ORAL | 0 refills | Status: DC

## 2017-04-24 MED ORDER — LENALIDOMIDE 5 MG PO CAPS CTSU E1A11: ORAL_CAPSULE | ORAL | 0 refills | Status: DC

## 2017-05-05 ENCOUNTER — Other Ambulatory Visit (HOSPITAL_BASED_OUTPATIENT_CLINIC_OR_DEPARTMENT_OTHER): Payer: BLUE CROSS/BLUE SHIELD

## 2017-05-05 ENCOUNTER — Encounter: Payer: BLUE CROSS/BLUE SHIELD | Admitting: *Deleted

## 2017-05-05 ENCOUNTER — Telehealth: Payer: Self-pay

## 2017-05-05 DIAGNOSIS — C9001 Multiple myeloma in remission: Secondary | ICD-10-CM | POA: Diagnosis not present

## 2017-05-05 DIAGNOSIS — Z006 Encounter for examination for normal comparison and control in clinical research program: Secondary | ICD-10-CM | POA: Diagnosis not present

## 2017-05-05 LAB — CBC WITH DIFFERENTIAL/PLATELET
BASO%: 0.3 % (ref 0.0–2.0)
Basophils Absolute: 0 10*3/uL (ref 0.0–0.1)
EOS%: 2.7 % (ref 0.0–7.0)
Eosinophils Absolute: 0.1 10*3/uL (ref 0.0–0.5)
HCT: 37.6 % (ref 34.8–46.6)
HGB: 11.4 g/dL — ABNORMAL LOW (ref 11.6–15.9)
LYMPH%: 58.8 % — ABNORMAL HIGH (ref 14.0–49.7)
MCH: 22.7 pg — ABNORMAL LOW (ref 25.1–34.0)
MCHC: 30.3 g/dL — ABNORMAL LOW (ref 31.5–36.0)
MCV: 74.8 fL — ABNORMAL LOW (ref 79.5–101.0)
MONO#: 0.4 10*3/uL (ref 0.1–0.9)
MONO%: 10.7 % (ref 0.0–14.0)
NEUT#: 0.9 10*3/uL — ABNORMAL LOW (ref 1.5–6.5)
NEUT%: 27.5 % — ABNORMAL LOW (ref 38.4–76.8)
Platelets: 221 10*3/uL (ref 145–400)
RBC: 5.03 10*6/uL (ref 3.70–5.45)
RDW: 15.1 % — ABNORMAL HIGH (ref 11.2–14.5)
WBC: 3.4 10*3/uL — ABNORMAL LOW (ref 3.9–10.3)
lymph#: 2 10*3/uL (ref 0.9–3.3)

## 2017-05-05 NOTE — Progress Notes (Signed)
05/05/2017 Patient in to clinic today for evaluation prior to beginning Maintenance treatment cycle 32. Patient returned completed cycle 31 Medication Calendar, confirming correct dosing as prescribed(dose level -3, Revlimid 5mg  every other day, Days 1-21).   Patient's ANC was 900, and therefore not high enough to begin treatment today. Patient was notified to hold Revlimid, and she will return to clinic in one week on 05/12/2017 for repeat CBC, as confirmed by Dr. Alvy Bimler. Patient verbalized understanding.   Patient reports ongoing complaints as noted in AE table and list below. She reports taking pain medication three times daily, which is controlling her pain adequately.  Adverse Event Log Study/Protocol: [facility] ECOG E1A11 Maintenance Cycle 31: 04/07/2017 - 05/05/2017 (Day 29) Event Grade Comments  Fatigue  Grade 2  Moderate, occasionally limiting ADLs  Sensory neuropathy  Grade 2 Moderate symptoms, unchanged from previous  Insomnia  Grade 2  Moderate difficulty, takes sleeping pill regularly.  Neutrophil count decreased Grade 3    Anemia Grade 1    Non-reportable AEs (grade): - Moderate pain (2)  Joint pain - knee, ankle, wrist, shoulder  Back pain  Bone pain - Headaches, migraines- moderate(2) - Depression (felt sad for one day) - moderate symptoms, grade 2 - Leukopenia (1) [name] BSN, RN, [place] 05/05/2017 9:09 AM

## 2017-05-05 NOTE — Telephone Encounter (Signed)
Spoke with patient per email from [facility] to add lab on 05/12/17,done and aware

## 2017-05-10 ENCOUNTER — Other Ambulatory Visit: Payer: Self-pay | Admitting: Hematology and Oncology

## 2017-05-10 ENCOUNTER — Encounter: Payer: Self-pay | Admitting: Hematology and Oncology

## 2017-05-11 ENCOUNTER — Other Ambulatory Visit: Payer: Self-pay | Admitting: Hematology and Oncology

## 2017-05-11 ENCOUNTER — Telehealth: Payer: Self-pay

## 2017-05-11 MED ORDER — OXYCODONE HCL 15 MG PO TABS: 15.0000 mg | ORAL_TABLET | Freq: Three times a day (TID) | ORAL | 0 refills | Status: DC | PRN

## 2017-05-11 NOTE — Telephone Encounter (Signed)
Called and told Rx for Oxycodone is available for pickup.

## 2017-05-12 ENCOUNTER — Other Ambulatory Visit (HOSPITAL_BASED_OUTPATIENT_CLINIC_OR_DEPARTMENT_OTHER): Payer: BLUE CROSS/BLUE SHIELD

## 2017-05-12 ENCOUNTER — Telehealth: Payer: Self-pay

## 2017-05-12 ENCOUNTER — Encounter: Payer: Self-pay | Admitting: Hematology and Oncology

## 2017-05-12 ENCOUNTER — Encounter: Payer: BLUE CROSS/BLUE SHIELD | Admitting: *Deleted

## 2017-05-12 DIAGNOSIS — Z006 Encounter for examination for normal comparison and control in clinical research program: Secondary | ICD-10-CM | POA: Diagnosis not present

## 2017-05-12 DIAGNOSIS — C9001 Multiple myeloma in remission: Secondary | ICD-10-CM

## 2017-05-12 LAB — CBC WITH DIFFERENTIAL/PLATELET
BASO%: 0.9 % (ref 0.0–2.0)
Basophils Absolute: 0 10*3/uL (ref 0.0–0.1)
EOS%: 2.1 % (ref 0.0–7.0)
Eosinophils Absolute: 0.1 10*3/uL (ref 0.0–0.5)
HCT: 37.1 % (ref 34.8–46.6)
HGB: 11.7 g/dL (ref 11.6–15.9)
LYMPH%: 49.9 % — ABNORMAL HIGH (ref 14.0–49.7)
MCH: 23.2 pg — ABNORMAL LOW (ref 25.1–34.0)
MCHC: 31.6 g/dL (ref 31.5–36.0)
MCV: 73.6 fL — ABNORMAL LOW (ref 79.5–101.0)
MONO#: 0.5 10*3/uL (ref 0.1–0.9)
MONO%: 14.4 % — ABNORMAL HIGH (ref 0.0–14.0)
NEUT#: 1.2 10*3/uL — ABNORMAL LOW (ref 1.5–6.5)
NEUT%: 32.7 % — ABNORMAL LOW (ref 38.4–76.8)
Platelets: 205 10*3/uL (ref 145–400)
RBC: 5.04 10*6/uL (ref 3.70–5.45)
RDW: 15.1 % — ABNORMAL HIGH (ref 11.2–14.5)
WBC: 3.7 10*3/uL — ABNORMAL LOW (ref 3.9–10.3)
lymph#: 1.9 10*3/uL (ref 0.9–3.3)

## 2017-05-12 MED ORDER — LENALIDOMIDE 5 MG PO CAPS CTSU E1A11: ORAL_CAPSULE | ORAL | 0 refills | Status: DC

## 2017-05-12 NOTE — Addendum Note (Signed)
Addended by: Benson Norway on: 05/12/2017 11:51 AM   Modules accepted: Orders

## 2017-05-12 NOTE — Telephone Encounter (Signed)
Patients appts rescheduled per email from [place].  A printout of new appts has been mailed to the patient. Carla Perry

## 2017-05-12 NOTE — Progress Notes (Signed)
05/12/2017 Patient in to clinic accompanied by her husband, Arnell Sieving, for repeat CBC today. Patient will be contacted with results and is not to begin Revlimid until instructed to do so. Patient was given printed appointment calendar with Cycle 32 treatment days marked. Also noted that subsequent appointments will need to be rescheduled, due to one week delay in start of treatment cycle 32. Patient voiced understanding. [name] BSN, RN, [place] 05/12/2017 8:19 AM  Spoke with patient by phone regarding CBC results from today. ANC is now 1200 and meets requirements for continuing treatment. No dose modification indicated, since neutropenia was not sustained for more than 7 days. Patient voiced understanding, and will document doses for the upcoming cycle, beginning today. [name] BSN, RN, [facility] 05/12/2017 8:39 AM

## 2017-05-27 ENCOUNTER — Other Ambulatory Visit: Payer: Self-pay | Admitting: Hematology and Oncology

## 2017-05-27 DIAGNOSIS — C9001 Multiple myeloma in remission: Secondary | ICD-10-CM

## 2017-06-01 ENCOUNTER — Other Ambulatory Visit: Payer: Self-pay | Admitting: *Deleted

## 2017-06-01 DIAGNOSIS — C9001 Multiple myeloma in remission: Secondary | ICD-10-CM

## 2017-06-01 MED ORDER — LENALIDOMIDE 5 MG PO CAPS CTSU E1A11: ORAL_CAPSULE | ORAL | 0 refills | Status: DC

## 2017-06-01 NOTE — Telephone Encounter (Signed)
Faxed Revlimid refill to [facility]

## 2017-06-02 ENCOUNTER — Other Ambulatory Visit: Payer: BLUE CROSS/BLUE SHIELD

## 2017-06-09 ENCOUNTER — Other Ambulatory Visit (HOSPITAL_BASED_OUTPATIENT_CLINIC_OR_DEPARTMENT_OTHER): Payer: Medicare Other

## 2017-06-09 ENCOUNTER — Encounter: Payer: Self-pay | Admitting: Hematology and Oncology

## 2017-06-09 ENCOUNTER — Encounter: Payer: Medicare Other | Admitting: *Deleted

## 2017-06-09 DIAGNOSIS — C9001 Multiple myeloma in remission: Secondary | ICD-10-CM

## 2017-06-09 DIAGNOSIS — Z006 Encounter for examination for normal comparison and control in clinical research program: Secondary | ICD-10-CM | POA: Diagnosis not present

## 2017-06-09 LAB — CBC WITH DIFFERENTIAL/PLATELET
BASO%: 0.3 % (ref 0.0–2.0)
Basophils Absolute: 0 10*3/uL (ref 0.0–0.1)
EOS%: 2.7 % (ref 0.0–7.0)
Eosinophils Absolute: 0.1 10*3/uL (ref 0.0–0.5)
HCT: 38.3 % (ref 34.8–46.6)
HGB: 11.8 g/dL (ref 11.6–15.9)
LYMPH%: 54.1 % — ABNORMAL HIGH (ref 14.0–49.7)
MCH: 23 pg — ABNORMAL LOW (ref 25.1–34.0)
MCHC: 30.8 g/dL — ABNORMAL LOW (ref 31.5–36.0)
MCV: 74.5 fL — ABNORMAL LOW (ref 79.5–101.0)
MONO#: 0.3 10*3/uL (ref 0.1–0.9)
MONO%: 9.1 % (ref 0.0–14.0)
NEUT#: 1.1 10*3/uL — ABNORMAL LOW (ref 1.5–6.5)
NEUT%: 33.8 % — ABNORMAL LOW (ref 38.4–76.8)
Platelets: 209 10*3/uL (ref 145–400)
RBC: 5.14 10*6/uL (ref 3.70–5.45)
RDW: 15.1 % — ABNORMAL HIGH (ref 11.2–14.5)
WBC: 3.3 10*3/uL — ABNORMAL LOW (ref 3.9–10.3)
lymph#: 1.8 10*3/uL (ref 0.9–3.3)

## 2017-06-09 MED ORDER — LENALIDOMIDE 5 MG PO CAPS CTSU E1A11: ORAL_CAPSULE | ORAL | 0 refills | Status: DC

## 2017-06-09 NOTE — Progress Notes (Signed)
06/09/2017 Patient in to clinic today for evaluation prior to beginning Maintenance treatment cycle 33. Patient returned completed cycle 32Medication Calendar, confirming correct dosing as prescribed(dose level -3, Revlimid 5mg  every other day, Days 1-21). Patient was given printed appointment calendar with Cycle 33treatment days marked. Patient reports ongoing complaints as noted in AE table and list below. She reports taking pain medication three times daily, which is controlling her pain adequately. Notified patient by phone that test results today meet the requirements for continuing treatment and that she may begin dosing with Revlimid today, as previously instructed. [name] BSN, RN, [place] 06/09/2017 8:53 AM  Adverse Event Log Study/Protocol: [facility] ECOG E1A11 Maintenance Cycle 32: 05/12/2017 - 06/08/2017 Event Grade Comments  Fatigue  Grade 2  Moderate, occasionally limiting ADLs  Sensory neuropathy  Grade 2 Moderate symptoms, unchanged from previous  Insomnia  Grade 2  Moderate difficulty, takes sleeping pill regularly.  Neutrophil count decreased Grade 2    Diarrhea Grade 1   Anemia Grade 1    Non-reportable AEs (grade): - Moderate pain (2)  Wrist pain  Back pain  Bone pain - Headaches, migraines- moderate(2) - Leukopenia (1) - Rash, left thorax (1) [name] BSN, RN, CCRP 06/09/2017 8:59 AM

## 2017-06-14 ENCOUNTER — Other Ambulatory Visit: Payer: Self-pay | Admitting: Hematology and Oncology

## 2017-06-14 DIAGNOSIS — C9001 Multiple myeloma in remission: Secondary | ICD-10-CM

## 2017-06-16 ENCOUNTER — Other Ambulatory Visit: Payer: Self-pay

## 2017-06-16 ENCOUNTER — Encounter: Payer: Self-pay | Admitting: *Deleted

## 2017-06-16 MED ORDER — OXYCODONE HCL 15 MG PO TABS: 15.0000 mg | ORAL_TABLET | Freq: Three times a day (TID) | ORAL | 0 refills | Status: DC | PRN

## 2017-06-22 ENCOUNTER — Telehealth: Payer: Self-pay | Admitting: Pharmacist

## 2017-06-22 ENCOUNTER — Other Ambulatory Visit: Payer: Self-pay | Admitting: *Deleted

## 2017-06-22 DIAGNOSIS — C9001 Multiple myeloma in remission: Secondary | ICD-10-CM

## 2017-06-22 MED ORDER — LENALIDOMIDE 5 MG PO CAPS CTSU E1A11: ORAL_CAPSULE | ORAL | 0 refills | Status: DC

## 2017-06-22 MED ORDER — OXYCODONE HCL 15 MG PO TABS: 15.0000 mg | ORAL_TABLET | Freq: Three times a day (TID) | ORAL | 0 refills | Status: DC | PRN

## 2017-06-22 NOTE — Telephone Encounter (Signed)
Oral Chemotherapy Pharmacist Encounter  Received message with questions about new prescription insurance coverage for patient's Revlimid. Patient now with Medicare prescription part D plan. I instructed patient to call pharmacy with updated insurance information, ask for copayment information, and then request pharmacy assist in finding copayment grant foundation coverage, if needed, as there are open funds for multiple myeloma.  Patient expressed understanding and will call pharmacy number on her Revlimid bottle to start insurance process.  Patient knows to call the office with questions or concerns.  Oral oncology Clinic will continue to follow.  Johny Drilling, PharmD, BCPS, BCOP 06/22/2017  2:26 PM Oral Oncology Clinic [phone]

## 2017-06-23 ENCOUNTER — Telehealth: Payer: Self-pay | Admitting: Pharmacy Technician

## 2017-06-23 NOTE — Telephone Encounter (Signed)
Oral Oncology Patient Advocate Encounter  Received a message from the oral oncology pharmacist that the patient has new insurance coverage for her Revlimid.  A new prior authorization for Revlimid is required for this new plan.   PA submitted on Cover My Meds and approved.  Key [id number]  PA# [id number].  Approval effective through 11/30/2017.   Fabio Asa. Melynda Keller, [place] [facility] Patient Advocate [phone] 06/23/2017 11:52 AM

## 2017-06-25 ENCOUNTER — Ambulatory Visit (INDEPENDENT_AMBULATORY_CARE_PROVIDER_SITE_OTHER): Payer: Medicare Other | Admitting: Neurology

## 2017-06-25 ENCOUNTER — Encounter: Payer: Self-pay | Admitting: Neurology

## 2017-06-25 ENCOUNTER — Other Ambulatory Visit: Payer: Self-pay | Admitting: Oncology

## 2017-06-25 VITALS — BP 122/72 | HR 54 | Wt 170.2 lb

## 2017-06-25 DIAGNOSIS — C9001 Multiple myeloma in remission: Secondary | ICD-10-CM

## 2017-06-25 DIAGNOSIS — G43709 Chronic migraine without aura, not intractable, without status migrainosus: Secondary | ICD-10-CM | POA: Diagnosis not present

## 2017-06-25 NOTE — Progress Notes (Signed)
[facility]   Provider:  Dr Jaynee Eagles Referring Provider: Wenda Low, MD Primary Care Physician:  Wenda Low, MD    CC: Chronic migraines and episodes of body paresthesias  Interval history 06/25/2017: Carla Perry has constant pressure on the left side of the head, Carla Perry has 6/10 daily headache, migraines are on the left side, can last days with light and sound sensitivity, nausea, no vomiting, pounding pain severe, no aura, no medication overuse. Carla Perry takes 2 tramadol at the onset of migraine. Dr. Erling Cruz had put her on Verapamil and Effexor. Carla Perry does feel that the Verapamil helps, if Carla Perry doesn't take it Carla Perry get a migraine.   Interval history 06/25/2016: Carla Perry gets 3-4 migraines a month, they last an hour and go away with the tramadol. Carla Perry still have low back pain and radiation into the legs. Carla Perry has numbness and weakness in the legs intermittently but MRI of the neural axis was negative. Kneeling thing we didn't do was imaging MRI of the lumbar spine. Considering that Carla Perry continues to have numbness and weakness in the legs will image MRI of the lumbar spine.  Interval update 03/18/2016: This is a lovely 61 year old female with a very complicated past medical history including hypertension, thyroid disorder, migraine, multiple myeloma s/p chemo currently participating in a research trial, neuropathy, insomnia, bone pain, back pain, joint pain, anemia, fatigue, leukopenia due to antineoplastic chemotherapy, right leg swelling, DVT, diabetes. I have seen Carla Perry in the past for chronic migraines but today Carla Perry is here for a new problem,paresthesias that started February. Happens when waking up in the morning. Never happens otherwise. Carla Perry feels an electrical current through her body with persistent tingling. Starts in the thoracic or in the lumbar spine. Feels like electricity. Starts in the spine. Carla Perry feels tingling all over, in the face, arms, legs from head to toe. Will last several  hours. A lot of times it is associated with a migraine. Nothing makes it better. Stretching aggravates the symptoms. Carla Perry has pain in the spine associated with the paresthesias. Last night it started with numbness at 2am. Carla Perry couldn't get back to sleep. Carla Perry developed a headache. He still has residual headache and Her left eye still fells sore and head feels sore. Also a residual burning feeling. Arms feel like they are burning. Episodes becoming more frequent. 6 episodes in Feb, 8 in march, 13 so far in April. Becoming more frequent and lasting longer. Carla Perry experiences facial numbness with the episodes, burning and tingling in the spine that spreads to her whole body. The symptoms are usually associated with headache or migraine. Moving around helps with the symptoms. No weakness. No vision changes but does describe eye pain. The headaches are pressure and pain.Carla Perry still gets the vertigo with the migraines. Headache left side of the head with the vertigo, no light or sound sensitivity. 5-10 minutes after the paresthesias Carla Perry has the migraines/headaches. No inciting events. No trauma. Carla Perry has low back pain, situated on the right side, radiates down the legs to the back of the legs, walking is not as good. Right leg goes numb. Paresthesias are more on the left.   Recent labs include normal B12. Normal TSH.  Interval update7/19/2016: This is a former Carla Perry of Dr. Janann Colonel who is seen in our office by our nurse practitioner [name]. Carla Perry is establishing care with me today. Carla Perry is a 61 year old female with a history of chronic migraines. Carla Perry was last seen in April. Today Carla Perry returns and Carla Perry is doing very well.  Carla Perry is on verapamil 120 mg extended release and venlafaxine XR 75 mg. Carla Perry is doing very well. Carla Perry has had maybe 3 migraines within the last few months. And they have been on the left side of the head with vertigo twice. When Carla Perry has the vertigo, Carla Perry has to lay in bed and sleep it off. But this is a  significant improvement and Carla Perry is very happy with this maintenance. Carla Perry endorses nausea, photophobia and phonophobia. Carla Perry takes tramadol for acute management. Discussed in detail that if Carla Perry is doing well, we will keep her on her current migraine management. Carla Perry can continue to follow up with [name] as needed.   HISTORY OF PRESENT ILLNESS: Carla Perry is a 61 year old female with a history of chronic migraines. Carla Perry returns today for follow-up. The Carla Perry is currently taking Effexor 37.5 mg daily as well as verapamil 120 mg daily. Carla Perry reports that at the last visit Dr. Janann Colonel decreased her Effexor. Carla Perry states since then Carla Perry has had 13 headaches in March and Carla Perry has had 4 headaches in April so far. Carla Perry states her headaches are normally located on the left side and associated with left-sided numbness. Carla Perry confirms nausea, photophobia and phonophobia. Carla Perry also states that when the headaches become severe its as if Carla Perry is on the verge of having vertigo. Carla Perry states that Carla Perry can take tramadol and her headache will normally resolve in 1-2 hours. Carla Perry states that if Carla Perry gets a headache on the right side it usually resolves with Advil migraine. The Carla Perry does have multiple myeloma and takes a chemotherapy pill. Carla Perry denies any new neurological symptoms. Denies any new medical issues. Carla Perry returns today for evaluation.  HISTORY 08/02/14 Janann Colonel): Carla Perry is a 62 y.o. female, former Dr Erling Cruz Carla Perry, here as a follow up appointment for chronic migraines with last visit being on 08/09/2013 at which time Carla Perry received Botox injections. Carla Perry notes good benefit from these injections. Did not follow up for repeat injections. Since last visit Carla Perry was unfortunately diagnosed with multiple myeloma and has been undergoing treatment with oncology. Reports so far things are going well. Carla Perry has one more cycle and then Carla Perry is finished. Carla Perry reports Carla Perry has held off on the Botox injections per recommendation of her  oncologist, instructed Carla Perry can restart once Carla Perry is done with chemotherapy.   Continues to have headaches. Typically will have 1 to 3 headaches per month. Takes tramadol and falls asleep, when Carla Perry wakes up the headache is gone. Describes it is a predominantly right sided temporal squeezing pounding type pain. + Nausea, no emesis, + photo and phonophobia. Headaches last 6 hours to all day. Notes Carla Perry gets weak on the left side with the headaches and has baseline numbness on the left side of her body. Reports Carla Perry has not had a MRI brain in years. Carla Perry notes Dr Erling Cruz started her on Effexor and Verapamil. Carla Perry wishes to get off both of these medications as Carla Perry feels they are not helping.   Recently saw eye doctor for right eye pain, told Carla Perry may have glaucoma in her right eye, they will continue to monitor. They also told her that Carla Perry has dry eyes, was given lubricating eye drops. The eye drops have improved the eye pain.   Initial visit 05/2013: Former Dr Erling Cruz Carla Perry. Carla Perry reports being overall stable. Continues to have daily migrainous headache which have been ongoing for greater then 4 years. Predominantly R sided temporal, described as squeezing and pounding. These headaches  are her baseline headache and are occuring on a near continuous pattern every day of the month. Is also having severe "flare ups" where the pain worsens and becomes a more sharp/intense pain. These episodes are occuring around 8 to 10 times per month. With these headaches Carla Perry continues to have episodes of transient unilateral (predominantly R sided) weakness that self resolves. Also notes occasional unilateral sensory changes (again R sided predominant). Also has episodes of transient vertigo associated with the headache, has occurred 2 times in the past month, requiring her to miss work. Carla Perry has had multiple MRIs during these events to rule out stroke and no ischemic changes have been found. Has been unable to tolerate most abortive agents  and due to presence of hemiplegic/basilar symptoms Carla Perry is not a candidate for triptans or DHE. In regards to prophylactic agents Carla Perry has tried for >3-4 months the following agents and failed/did not tolerate: Topamax (AED), Propranolol (BB), verapamil (CCB) and Effexor (SNRI). Remains on verapmil and Effexor per Dr Erling Cruz but no benefit noted. Denies any family history of hemiplegic migraine.   Continues to have subjective memory difficulties, notes a "fogginess". Has had formal neuro-psych testing which showed an improvement in her overall cognitive function. Continues to have episodes of auditory hallucinations. Has not seen a psychiatrist for this.    Review of Systems: Carla Perry complains of symptoms per HPI as well as the following symptoms: Joint pain, back pain, numbness. Pertinent negatives per HPI. All others negative.  Social History   Social History  . Marital status: Married    Spouse name: Arnell Sieving  . Number of children: 2  . Years of education: 14   Occupational History  .  Lab [employer]   Social History Main Topics  . Smoking status: Never Smoker  . Smokeless tobacco: Never Used  . Alcohol use No  . Drug use: No  . Sexual activity: Not on file   Other Topics Concern  . Not on file   Social History Narrative   Carla Perry lives at home with her husband Special educational needs teacher). Carla Perry has two years college.   Right handed.   Caffeine- None    Family History  Problem Relation Age of Onset  . Throat cancer Father   . Stroke Father   . Cancer Brother        prostate    Past Medical History:  Diagnosis Date  . Abnormal thyroid function test 08/30/2014  . Anemia   . Anemia, unspecified 10/06/2013  . Bone pain 10/21/2013  . Bronchitis 01/31/2015  . Diabetes mellitus without complication ([place]) 59/1638   Steroid induced diabetes. has not picked oral med up from pharmacy as of 09-14-14  . Diverticulitis 07/18/2014  . DVT (deep venous thrombosis) ([place]) 02/07/2014  . HBP (high blood  pressure)   . Insomnia 02/11/2016  . Leukopenia due to antineoplastic chemotherapy ([place]) 12/27/2013  . Memory loss   . MGUS (monoclonal gammopathy of unknown significance)   . MGUS (monoclonal gammopathy of unknown significance) 10/06/2013  . Migraine   . Multiple myeloma, without mention of having achieved remission 11/16/2013  . Pancytopenia ([place]) 12/27/2015  . Peripheral neuropathy 12/27/2015  . Right leg swelling 02/07/2014  . Seizure ([place]) 1960   single seizure episode at age 42  . Thyroid disorder   . Vitamin D deficiency 10/24/2014    Past Surgical History:  Procedure Laterality Date  . HEMORRHOID SURGERY    . PORT-A-CATH REMOVAL  10-2014  . PORTACATH PLACEMENT Right jan 2015  Current Outpatient Prescriptions  Medication Sig Dispense Refill  . aspirin 325 MG tablet Take 1 tablet (325 mg total) by mouth daily.    . carboxymethylcellulose (REFRESH PLUS) 0.5 % SOLN Place 1 drop into both eyes daily as needed (for dry eyes).     . Cholecalciferol (VITAMIN D3) 2000 UNITS TABS Take 2,000 Units by mouth daily.    . hydrochlorothiazide (HYDRODIURIL) 25 MG tablet Take 25 mg by mouth every evening.     Marland Kitchen lenalidomide (REVLIMID) 5 MG capsule Take 1 capsule every other day for 21 days, off 7 days. Repeat every 28 days. 11 capsule 0  . levothyroxine (SYNTHROID, LEVOTHROID) 75 MCG tablet Take 75 mcg by mouth daily before breakfast.    . Multiple Vitamins-Minerals (CENTRUM SILVER PO) Take 1 tablet by mouth daily.     . ONGLYZA 5 MG TABS tablet Take 5 mg by mouth daily.     Marland Kitchen oxyCODONE (ROXICODONE) 15 MG immediate release tablet Take 1 tablet (15 mg total) by mouth 3 (three) times daily as needed for pain. 90 tablet 0  . polyethylene glycol (MIRALAX / GLYCOLAX) packet Take 17 g by mouth daily as needed for mild constipation.     . RESTASIS 0.05 % ophthalmic emulsion PLACE 1 DROP IN BOTH EYES IN THE EVENING  4  . traMADol (ULTRAM) 50 MG tablet Take 1 tablet (50 mg total) by mouth every 6 (six)  hours as needed for moderate pain. 90 tablet 0  . traZODone (DESYREL) 50 MG tablet TAKE 1 TABLET (50 MG TOTAL) BY MOUTH AT BEDTIME. 30 tablet 6  . venlafaxine XR (EFFEXOR-XR) 150 MG 24 hr capsule Take 1 capsule (150 mg total) by mouth daily with breakfast. 90 capsule 1  . verapamil (VERELAN PM) 120 MG 24 hr capsule Take 1 capsule by mouth  daily 30 capsule 11  . vitamin E (VITAMIN E) 1000 UNIT capsule Take 400 Units by mouth daily.      No current facility-administered medications for this visit.    Facility-Administered Medications Ordered in Other Visits  Medication Dose Route Frequency Provider Last Rate Last Dose  . gadopentetate dimeglumine (MAGNEVIST) injection 15 mL  15 mL Intravenous Once PRN Melvenia Beam, MD        Allergies as of 06/25/2017  . (No Known Allergies)    Vitals: BP 122/72   Pulse (!) 54   Wt 170 lb 3.2 oz (77.2 kg)   BMI 26.66 kg/m  Last Weight:  Wt Readings from Last 1 Encounters:  06/25/17 170 lb 3.2 oz (77.2 kg)   Last Height:   Ht Readings from Last 1 Encounters:  04/07/17 5' 7"  (1.702 m)   Physical exam: Exam: Gen: NAD, conversant, well nourised, obese, well groomed                     CV: RRR, no MRG. No Carotid Bruits. No peripheral edema, warm, nontender Eyes: Conjunctivae clear without exudates or hemorrhage  Neuro: Detailed Neurologic Exam  Speech:    Speech is normal; fluent and spontaneous with normal comprehension.  Cognition:    The Carla Perry is oriented to person, place, and time;     recent and remote memory intact;     language fluent;     normal attention, concentration,     fund of knowledge Cranial Nerves:    The pupils are equal, round, and reactive to light. The fundi are normal and spontaneous venous pulsations are present. Visual fields are full  to finger confrontation. Extraocular movements are intact. Trigeminal sensation is intact and the muscles of mastication are normal. The face is symmetric. The palate elevates  in the midline. Hearing intact. Voice is normal. Shoulder shrug is normal. The tongue has normal motion without fasciculations.   Coordination:    Normal finger to nose and heel to shin. Normal rapid alternating movements.   Gait:    Heel-toe and tandem gait are normal.   Motor Observation:    No asymmetry, no atrophy, and no involuntary movements noted. Tone:    Normal muscle tone.    Posture:    Posture is normal. normal erect    Strength:    Strength is V/V in the upper and lower limbs.      Sensation: intact to LT     Reflex Exam:  DTR's:    Deep tendon reflexes in the upper and lower extremities are normal bilaterally.   Toes:    The toes are downgoing bilaterally.   Clonus:    Clonus is absent.        Assessment/Plan:  This is a lovely 61 year old female with a very complicated past medical history including hypertension, thyroid disorder, migraine, multiple myeloma s/p chemo currently participating in a research trial, neuropathy, insomnia, bone pain, back pain, joint pain, anemia, fatigue, leukopenia due to antineoplastic chemotherapy, right leg swelling, DVT, diabetes. MRI imaging of the brain, cervical spinal and thoracic spine were negative. Carla Perry still has numbness from the abdomen down the legs. Carla Perry still has weakness in the legs. Carla Perry still has low back pain and radiation. Carla Perry is her for migraines today which are chronic, treated with botox in the past with good results but Carla Perry does not like the shots.   Will try Aimovig for migraines  Discussed: To prevent or relieve headaches, try the following: Cool Compress. Lie down and place a cool compress on your head.  Avoid headache triggers. If certain foods or odors seem to have triggered your migraines in the past, avoid them. A headache diary might help you identify triggers.  Include physical activity in your daily routine. Try a daily walk or other moderate aerobic exercise.  Manage stress. Find healthy ways to  cope with the stressors, such as delegating tasks on your to-do list.  Practice relaxation techniques. Try deep breathing, yoga, massage and visualization.  Eat regularly. Eating regularly scheduled meals and maintaining a healthy diet might help prevent headaches. Also, drink plenty of fluids.  Follow a regular sleep schedule. Sleep deprivation might contribute to headaches Consider biofeedback. With this mind-body technique, you learn to control certain bodily functions - such as muscle tension, heart rate and blood pressure - to prevent headaches or reduce headache pain.    Proceed to emergency room if you experience new or worsening symptoms or symptoms do not resolve, if you have new neurologic symptoms or if headache is severe, or for any concerning symptom.   Provided education and documentation from American headache Society toolbox including articles on: chronic migraine medication overuse headache, chronic migraines, prevention of migraines, behavioral and other nonpharmacologic treatments for headache.   Sarina Ill, MD  [facility] [address] [place], [place] [address]  Phone [phone] Fax [phone]  A total of 25 minutes was spent face-to-face with this Carla Perry. Over half this time was spent on counseling Carla Perry on the chronic migraine diagnosis and different diagnostic and therapeutic options available.

## 2017-06-26 ENCOUNTER — Other Ambulatory Visit: Payer: BLUE CROSS/BLUE SHIELD

## 2017-06-29 ENCOUNTER — Encounter: Payer: Medicare Other | Admitting: *Deleted

## 2017-06-29 ENCOUNTER — Other Ambulatory Visit: Payer: Self-pay

## 2017-06-29 ENCOUNTER — Telehealth: Payer: Self-pay

## 2017-06-29 DIAGNOSIS — C9001 Multiple myeloma in remission: Secondary | ICD-10-CM

## 2017-06-29 MED ORDER — LENALIDOMIDE 5 MG PO CAPS: 5.0000 mg | ORAL_CAPSULE | ORAL | 0 refills | Status: DC

## 2017-06-29 NOTE — Telephone Encounter (Signed)
Automn at [facility] [facility] called to let [facility] know that the rx for revlimid does not have a [employer] authorization number.

## 2017-06-30 ENCOUNTER — Encounter: Payer: Medicare Other | Admitting: *Deleted

## 2017-06-30 ENCOUNTER — Ambulatory Visit (HOSPITAL_COMMUNITY)
Admission: RE | Admit: 2017-06-30 | Discharge: 2017-06-30 | Disposition: A | Discharge status: Home / Self Care | Payer: Medicare Other | Source: Ambulatory Visit | Attending: Hematology and Oncology | Admitting: Hematology and Oncology

## 2017-06-30 ENCOUNTER — Other Ambulatory Visit (HOSPITAL_BASED_OUTPATIENT_CLINIC_OR_DEPARTMENT_OTHER): Payer: Medicare Other

## 2017-06-30 DIAGNOSIS — I700 Atherosclerosis of aorta: Secondary | ICD-10-CM | POA: Diagnosis not present

## 2017-06-30 DIAGNOSIS — N289 Disorder of kidney and ureter, unspecified: Secondary | ICD-10-CM | POA: Insufficient documentation

## 2017-06-30 DIAGNOSIS — C9001 Multiple myeloma in remission: Secondary | ICD-10-CM

## 2017-06-30 DIAGNOSIS — Z006 Encounter for examination for normal comparison and control in clinical research program: Secondary | ICD-10-CM

## 2017-06-30 DIAGNOSIS — R911 Solitary pulmonary nodule: Secondary | ICD-10-CM | POA: Diagnosis present

## 2017-06-30 DIAGNOSIS — R918 Other nonspecific abnormal finding of lung field: Secondary | ICD-10-CM | POA: Insufficient documentation

## 2017-06-30 DIAGNOSIS — Z79899 Other long term (current) drug therapy: Secondary | ICD-10-CM

## 2017-06-30 LAB — COMPREHENSIVE METABOLIC PANEL
ALT: 27 U/L (ref 0–55)
AST: 23 U/L (ref 5–34)
Albumin: 3.5 g/dL (ref 3.5–5.0)
Alkaline Phosphatase: 49 U/L (ref 40–150)
Anion Gap: 7 mEq/L (ref 3–11)
BUN: 9.4 mg/dL (ref 7.0–26.0)
CO2: 29 mEq/L (ref 22–29)
Calcium: 9.2 mg/dL (ref 8.4–10.4)
Chloride: 104 mEq/L (ref 98–109)
Creatinine: 0.9 mg/dL (ref 0.6–1.1)
EGFR: 83 mL/min/{1.73_m2} — ABNORMAL LOW (ref >90)
Glucose: 90 mg/dl (ref 70–140)
Potassium: 3.6 mEq/L (ref 3.5–5.1)
Sodium: 140 mEq/L (ref 136–145)
Total Bilirubin: 0.34 mg/dL (ref 0.20–1.20)
Total Protein: 7.1 g/dL (ref 6.4–8.3)

## 2017-06-30 LAB — CBC WITH DIFFERENTIAL/PLATELET
BASO%: 0.3 % (ref 0.0–2.0)
Basophils Absolute: 0 10*3/uL (ref 0.0–0.1)
EOS%: 4 % (ref 0.0–7.0)
Eosinophils Absolute: 0.1 10*3/uL (ref 0.0–0.5)
HCT: 38.5 % (ref 34.8–46.6)
HGB: 11.8 g/dL (ref 11.6–15.9)
LYMPH%: 47.7 % (ref 14.0–49.7)
MCH: 23 pg — ABNORMAL LOW (ref 25.1–34.0)
MCHC: 30.6 g/dL — ABNORMAL LOW (ref 31.5–36.0)
MCV: 74.9 fL — ABNORMAL LOW (ref 79.5–101.0)
MONO#: 0.4 10*3/uL (ref 0.1–0.9)
MONO%: 13.4 % (ref 0.0–14.0)
NEUT#: 1.1 10*3/uL — ABNORMAL LOW (ref 1.5–6.5)
NEUT%: 34.6 % — ABNORMAL LOW (ref 38.4–76.8)
Platelets: 184 10*3/uL (ref 145–400)
RBC: 5.14 10*6/uL (ref 3.70–5.45)
RDW: 15.5 % — ABNORMAL HIGH (ref 11.2–14.5)
WBC: 3.3 10*3/uL — ABNORMAL LOW (ref 3.9–10.3)
lymph#: 1.6 10*3/uL (ref 0.9–3.3)

## 2017-06-30 LAB — LACTATE DEHYDROGENASE: LDH: 192 U/L (ref 125–245)

## 2017-06-30 LAB — TSH: TSH: 1.62 m(IU)/L (ref 0.308–3.960)

## 2017-06-30 NOTE — Progress Notes (Signed)
06/30/2017 Patient in to clinic today for labs including myeloma evaluation prior to beginning treatment Cycle 34 next week. Met with patient upon arrival to clinic - see separate Consent Form encounter regarding reconsent. Patient reports ongoing similar complaints to previous cycles, including grade 2 pain (back pain, bone pain, hip pain, migraine), as well as fatigue and insomnia. Patient returned completed cycle 33 Medication Calendar, confirming dosing at dose level -3, Revlimid 55m every other day, Days 1-21. Patient also inquired about dates for upcoming cycles, as she would like to [place] Will discuss further at next week's visit, including dates for upcoming cycles in September and October, as well as anticipated bone marrow biopsy at the end of Cycle 36 (week of 09/22/17). Cindy S. SBrigitte PulseBSN, RN, CCRP 06/30/2017 11:01 AM

## 2017-07-01 LAB — KAPPA/LAMBDA LIGHT CHAINS
Ig Kappa Free Light Chain: 22.6 mg/L — ABNORMAL HIGH (ref 3.3–19.4)
Ig Lambda Free Light Chain: 19.1 mg/L (ref 5.7–26.3)
Kappa/Lambda FluidC Ratio: 1.18 (ref 0.26–1.65)

## 2017-07-02 LAB — MULTIPLE MYELOMA PANEL, SERUM
Albumin SerPl Elph-Mcnc: 3.3 g/dL (ref 2.9–4.4)
Albumin/Glob SerPl: 1 (ref 0.7–1.7)
Alpha 1: 0.2 g/dL (ref 0.0–0.4)
Alpha2 Glob SerPl Elph-Mcnc: 0.5 g/dL (ref 0.4–1.0)
B-Globulin SerPl Elph-Mcnc: 1 g/dL (ref 0.7–1.3)
Gamma Glob SerPl Elph-Mcnc: 1.7 g/dL (ref 0.4–1.8)
Globulin, Total: 3.5 g/dL (ref 2.2–3.9)
IgA, Qn, Serum: 137 mg/dL (ref 87–352)
IgG, Qn, Serum: 1575 mg/dL (ref 700–1600)
IgM, Qn, Serum: 71 mg/dL (ref 26–217)
Total Protein: 6.8 g/dL (ref 6.0–8.5)

## 2017-07-03 ENCOUNTER — Ambulatory Visit: Payer: BLUE CROSS/BLUE SHIELD | Admitting: Hematology and Oncology

## 2017-07-07 ENCOUNTER — Encounter: Payer: Medicare Other | Admitting: *Deleted

## 2017-07-07 ENCOUNTER — Telehealth: Payer: Self-pay | Admitting: Hematology and Oncology

## 2017-07-07 ENCOUNTER — Encounter: Payer: Self-pay | Admitting: Hematology and Oncology

## 2017-07-07 ENCOUNTER — Ambulatory Visit (HOSPITAL_BASED_OUTPATIENT_CLINIC_OR_DEPARTMENT_OTHER): Payer: Medicare Other | Admitting: Hematology and Oncology

## 2017-07-07 VITALS — BP 149/75 | HR 57 | Temp 98.7°F | Resp 20 | Ht 67.0 in | Wt 172.0 lb

## 2017-07-07 DIAGNOSIS — R911 Solitary pulmonary nodule: Secondary | ICD-10-CM

## 2017-07-07 DIAGNOSIS — D61811 Other drug-induced pancytopenia: Secondary | ICD-10-CM

## 2017-07-07 DIAGNOSIS — G893 Neoplasm related pain (acute) (chronic): Secondary | ICD-10-CM | POA: Diagnosis not present

## 2017-07-07 DIAGNOSIS — Z006 Encounter for examination for normal comparison and control in clinical research program: Secondary | ICD-10-CM | POA: Diagnosis not present

## 2017-07-07 DIAGNOSIS — C9001 Multiple myeloma in remission: Secondary | ICD-10-CM

## 2017-07-07 DIAGNOSIS — C9000 Multiple myeloma not having achieved remission: Secondary | ICD-10-CM

## 2017-07-07 MED ORDER — TRAMADOL HCL 50 MG PO TABS: 50.0000 mg | ORAL_TABLET | Freq: Four times a day (QID) | ORAL | 0 refills | Status: DC | PRN

## 2017-07-07 MED ORDER — OXYCODONE HCL 15 MG PO TABS: 15.0000 mg | ORAL_TABLET | Freq: Three times a day (TID) | ORAL | 0 refills | Status: DC | PRN

## 2017-07-07 MED ORDER — LENALIDOMIDE 5 MG PO CAPS CTSU E1A11
ORAL_CAPSULE | ORAL | 0 refills | Status: DC
Start: 2017-07-07 — End: 2017-07-25

## 2017-07-07 NOTE — Telephone Encounter (Signed)
Scheduled appt per 8/7 los  For research and Dr. Alvy Bimler - Gave patient AVS and calender per los.

## 2017-07-09 ENCOUNTER — Encounter: Payer: Self-pay | Admitting: Hematology and Oncology

## 2017-07-09 NOTE — Progress Notes (Signed)
[place] OFFICE PROGRESS NOTE  Patient Care Team: Wenda Low, MD as PCP - General (Internal Medicine) Heath Lark, MD as Consulting Physician (Hematology and Oncology) Benson Norway, RN as Registered Nurse (Oncology)  SUMMARY OF ONCOLOGIC HISTORY: Oncology History   ISS stage 1 IgG lambda subtype (serum albumin 3.6, Beta2 microglobulin 2.32) Durie Salmon Stage 1     Multiple myeloma in remission ([place])   10/10/2013 Imaging    Skeletal survery was negative      11/09/2013 Bone Marrow Biopsy    BM biopsy confirmed myeloma, 76% involved, IgG lambda subtype      12/06/2013 - 08/29/2014 Chemotherapy    Sh received chemo with revlimid, Velcade, Dexamethasone and Zometa. 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      05/05/2014 Imaging    Imaging study of the neck showed no explanation that could cause right neck pain. She is noted to have incidental left upper lung nodule. Plan to repeat imaging study in 3 months.      09/06/2014 Imaging    Bone survey showed no evidence of fracture      09/14/2014 Bone Marrow Biopsy    Bone marrow biopsy showed 8% residual plasma cells by manual count but none on the biopsy specimen      09/26/2014 -  Chemotherapy    She is started on cycle 1 of maintenance Revlimid      01/31/2015 Imaging     chest x-ray showed pneumonia. Treatment was placed on hold.      05/03/2015 Bone Marrow Biopsy    Accession: [id number] repeat bone marrow aspirate and biopsy show 5% residual plasma cells      10/14/2016 Bone Marrow Biopsy    Bone marrow biopsy showed the plasma cells represent 4% of all cells with lack of large aggregates or sheets. To assess the plasma cell clonality, immunohistochemical stains is performed and it lack clonality. Normal  cytogenetics and FISH      01/09/2017 Imaging    CT chest showed ground-glass 1.5 cm apical left upper lobe pulmonary nodule. Initial follow-up with CT at 6-12 months is recommended to confirm persistence. If persistent, repeat CT is recommended every 2 years until 5 years of stability has been established. This recommendation follows the consensus statement: Guidelines for Management of Incidental Pulmonary Nodules Detected on CT Images: From the Fleischner Society 2017; Radiology 2017; 284:228-243. 2. Mild patchy ground-glass opacities in the right upper lobe, probably inflammatory, which can also be reassessed on follow-up chest CT performed for the above dominant ground-glass nodule. 3. Solitary 3 mm right lower lobe solid pulmonary nodule, which can also be reassessed on follow-up chest CT performed for the above dominant ground-glass nodule. 4. No thoracic adenopathy. 5. Aortic atherosclerosis. Two-vessel coronary atherosclerosis.      06/30/2017 Imaging    1. No interval change in the 11 x 13 mm ground-glass nodule left upper lobe. Given the nearly 6 months of imaging stability, repeat CT is recommended every 2 years until 5 years of stability has been established. This recommendation follows the consensus statement: Guidelines for Management of Incidental Pulmonary Nodules Detected on CT Images: From the Fleischner Society 2017; Radiology 2017; 284:228-243. 2. Interval resolution of the tiny patchy ground-glass nodules right upper lobe, likely  infectious/inflammatory etiology. 3. Stable 3 mm right lower lobe pulmonary nodule. Attention on follow-up recommended. 4. Aortic Atherosclerois (ICD10-170.0)       INTERVAL HISTORY: Please see below for problem oriented charting. She returns for further follow-up She has been feeling well She is coping with pain with current prescription pain medicine She denies recent infection No abnormal weight loss, nausea or diarrhea  REVIEW OF SYSTEMS:    Constitutional: Denies fevers, chills or abnormal weight loss Eyes: Denies blurriness of vision Ears, nose, mouth, throat, and face: Denies mucositis or sore throat Respiratory: Denies cough, dyspnea or wheezes Cardiovascular: Denies palpitation, chest discomfort or lower extremity swelling Gastrointestinal:  Denies nausea, heartburn or change in bowel habits Skin: Denies abnormal skin rashes Lymphatics: Denies new lymphadenopathy or easy bruising Neurological:Denies numbness, tingling or new weaknesses Behavioral/Psych: Mood is stable, no new changes  All other systems were reviewed with the patient and are negative.  I have reviewed the past medical history, past surgical history, social history and family history with the patient and they are unchanged from previous note.  ALLERGIES:  has No Known Allergies.  MEDICATIONS:  Current Outpatient Prescriptions  Medication Sig Dispense Refill  . aspirin 325 MG tablet Take 1 tablet (325 mg total) by mouth daily.    . carboxymethylcellulose (REFRESH PLUS) 0.5 % SOLN Place 1 drop into both eyes daily as needed (for dry eyes).     . Cholecalciferol (VITAMIN D3) 2000 UNITS TABS Take 2,000 Units by mouth daily.    . hydrochlorothiazide (HYDRODIURIL) 25 MG tablet Take 25 mg by mouth every evening.     Marland Kitchen lenalidomide (REVLIMID) 5 MG capsule Take 1 capsule every other day for 21 days, off 7 days. Repeat every 28 days. 11 capsule 0  . levothyroxine (SYNTHROID, LEVOTHROID) 75 MCG tablet Take 75 mcg by mouth daily before breakfast.    . Multiple Vitamins-Minerals (CENTRUM SILVER PO) Take 1 tablet by mouth daily.     . ONGLYZA 5 MG TABS tablet Take 5 mg by mouth daily.     Marland Kitchen oxyCODONE (ROXICODONE) 15 MG immediate release tablet Take 1 tablet (15 mg total) by mouth 3 (three) times daily as needed for pain. 90 tablet 0  . polyethylene glycol (MIRALAX / GLYCOLAX) packet Take 17 g by mouth daily as needed for mild constipation.     . RESTASIS 0.05 %  ophthalmic emulsion PLACE 1 DROP IN BOTH EYES IN THE EVENING  4  . traMADol (ULTRAM) 50 MG tablet Take 1 tablet (50 mg total) by mouth every 6 (six) hours as needed for moderate pain. 90 tablet 0  . traZODone (DESYREL) 50 MG tablet TAKE 1 TABLET (50 MG TOTAL) BY MOUTH AT BEDTIME. 30 tablet 6  . venlafaxine XR (EFFEXOR-XR) 150 MG 24 hr capsule Take 1 capsule (150 mg total) by mouth daily with breakfast. 90 capsule 1  . verapamil (VERELAN PM) 120 MG 24 hr capsule Take 1 capsule by mouth  daily 30 capsule 11  . vitamin E (VITAMIN E) 1000 UNIT capsule Take 400 Units by mouth daily.      No current facility-administered medications for this visit.    Facility-Administered Medications Ordered in Other Visits  Medication Dose Route Frequency Provider Last Rate Last Dose  . gadopentetate dimeglumine (MAGNEVIST) injection 15 mL  15 mL Intravenous Once PRN Melvenia Beam, MD        PHYSICAL EXAMINATION: ECOG PERFORMANCE STATUS: 1 - Symptomatic but completely ambulatory  Vitals:   07/07/17 5366  BP: (!) 149/75  Pulse: (!) 57  Resp: 20  Temp: 98.7 F (37.1 C)  SpO2: 100%   Filed Weights   07/07/17 0817  Weight: 172 lb (78 kg)    GENERAL:alert, no distress and comfortable SKIN: skin color, texture, turgor are normal, no rashes or significant lesions EYES: normal, Conjunctiva are pink and non-injected, sclera clear OROPHARYNX:no exudate, no erythema and lips, buccal mucosa, and tongue normal  NECK: supple, thyroid normal size, non-tender, without nodularity LYMPH:  no palpable lymphadenopathy in the cervical, axillary or inguinal LUNGS: clear to auscultation and percussion with normal breathing effort HEART: regular rate & rhythm and no murmurs and no lower extremity edema ABDOMEN:abdomen soft, non-tender and normal bowel sounds Musculoskeletal:no cyanosis of digits and no clubbing  NEURO: alert & oriented x 3 with fluent speech, no focal motor/sensory deficits  LABORATORY DATA:  I  have reviewed the data as listed    Component Value Date/Time   NA 140 06/30/2017 0802   K 3.6 06/30/2017 0802   CL 105 10/14/2016 0835   CO2 29 06/30/2017 0802   GLUCOSE 90 06/30/2017 0802   BUN 9.4 06/30/2017 0802   CREATININE 0.9 06/30/2017 0802   CALCIUM 9.2 06/30/2017 0802   PROT 7.1 06/30/2017 0802   PROT 6.8 06/30/2017 0802   ALBUMIN 3.5 06/30/2017 0802   AST 23 06/30/2017 0802   ALT 27 06/30/2017 0802   ALKPHOS 49 06/30/2017 0802   BILITOT 0.34 06/30/2017 0802   GFRNONAA >60 10/14/2016 0835   GFRAA >60 10/14/2016 0835    No results found for: SPEP, UPEP  Lab Results  Component Value Date   WBC 3.3 (L) 06/30/2017   NEUTROABS 1.1 (L) 06/30/2017   HGB 11.8 06/30/2017   HCT 38.5 06/30/2017   MCV 74.9 (L) 06/30/2017   PLT 184 06/30/2017      Chemistry      Component Value Date/Time   NA 140 06/30/2017 0802   K 3.6 06/30/2017 0802   CL 105 10/14/2016 0835   CO2 29 06/30/2017 0802   BUN 9.4 06/30/2017 0802   CREATININE 0.9 06/30/2017 0802      Component Value Date/Time   CALCIUM 9.2 06/30/2017 0802   ALKPHOS 49 06/30/2017 0802   AST 23 06/30/2017 0802   ALT 27 06/30/2017 0802   BILITOT 0.34 06/30/2017 0802       RADIOGRAPHIC STUDIES: I have personally reviewed the radiological images as listed and agreed with the findings in the report. Ct Chest Wo Contrast  Result Date: 06/30/2017 CLINICAL DATA:  Lung nodule EXAM: CT CHEST WITHOUT CONTRAST TECHNIQUE: Multidetector CT imaging of the chest was performed following the standard protocol without IV contrast. COMPARISON:  01/09/2017 FINDINGS: Cardiovascular: The heart size is normal. No pericardial effusion. Atherosclerotic calcification is noted in the wall of the thoracic aorta. Dense nonocclusive dystrophic calcifications identified in the right brachiocephalic vein, stable. Mediastinum/Nodes: No mediastinal lymphadenopathy. No evidence for gross hilar lymphadenopathy although assessment is limited by the lack  of intravenous contrast on today's study. The esophagus has normal imaging features. There is no axillary lymphadenopathy. Lungs/Pleura: 11 x 15 mm ground-glass nodule in the left lung apex on the prior study measures 11 x 13 mm today. Patchy ground-glass nodules identified in the right upper lobe on the prior study have resolved in the interval. 3 mm right lower lobe pulmonary nodule (image 58 series 7) is stable. No pulmonary edema or pleural effusion. Upper Abdomen: 4.5 cm water density lesion upper pole right kidney  is stable and consistent with a cyst. Musculoskeletal: Bone windows reveal no worrisome lytic or sclerotic osseous lesions. IMPRESSION: 1. No interval change in the 11 x 13 mm ground-glass nodule left upper lobe. Given the nearly 6 months of imaging stability, repeat CT is recommended every 2 years until 5 years of stability has been established. This recommendation follows the consensus statement: Guidelines for Management of Incidental Pulmonary Nodules Detected on CT Images: From the Fleischner Society 2017; Radiology 2017; 284:228-243. 2. Interval resolution of the tiny patchy ground-glass nodules right upper lobe, likely infectious/inflammatory etiology. 3. Stable 3 mm right lower lobe pulmonary nodule. Attention on follow-up recommended. 4.  Aortic Atherosclerois (ICD10-170.0) Electronically Signed   By: Misty Stanley M.D.   On: 06/30/2017 11:37    ASSESSMENT & PLAN:  Multiple myeloma in remission ([place]) I reviewed test results with her  So far, her last bone marrow biopsy and recent blood work confirmed complete remission. The patient is comfortable to remain on Revlimid indefinitely. She will continue treatment per research protocol She will continue calcium with vitamin D supplement along with aspirin for DVT prophylaxis. 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 I will see her back in 3 months for further review.  Drug-induced pancytopenia  ([place]) This is likely due to recent treatment. The patient denies recent history of fevers, cough, chills, diarrhea or dysuria. She is asymptomatic from the leukopenia. I will observe for now.  I will continue the chemotherapy at current dose without dosage adjustment.  If the leukopenia gets progressive worse in the future, I might have to delay her treatment or adjust the chemotherapy dose per protocol   Cancer associated pain  She have chronic bone pain which started with a diagnosis of multiple myeloma. We discussed chronic pain management. MRI did suggest she had evidence of degenerative disc disease and spinal stenosis. After much discussion, the patient is comfortable to remain on chronic narcotic prescription. I refilled her prescriptions We discussed narcotic refill policy  Lung nodule seen on imaging study CT scan of the lung reviewed no changes in the lung nodules Per radiologist recommendation, will recheck in 2 years   Orders Placed This Encounter  Procedures  . DG Bone Survey Met    Standing Status:   Future    Standing Expiration Date:   09/06/2018    Order Specific Question:   Reason for Exam (SYMPTOM  OR DIAGNOSIS REQUIRED)    Answer:   myeloma staging    Order Specific Question:   Preferred imaging location?    Answer:   [facility]   All questions were answered. The patient knows to call the clinic with any problems, questions or concerns. No barriers to learning was detected. I spent 15 minutes counseling the patient face to face. The total time spent in the appointment was 20 minutes and more than 50% was on counseling and review of test results     Heath Lark, MD 07/09/2017 2:12 PM

## 2017-07-09 NOTE — Assessment & Plan Note (Signed)
This is likely due to recent treatment. The patient denies recent history of fevers, cough, chills, diarrhea or dysuria. She is asymptomatic from the leukopenia. I will observe for now.  I will continue the chemotherapy at current dose without dosage adjustment.  If the leukopenia gets progressive worse in the future, I might have to delay her treatment or adjust the chemotherapy dose per protocol

## 2017-07-09 NOTE — Assessment & Plan Note (Signed)
CT scan of the lung reviewed no changes in the lung nodules Per radiologist recommendation, will recheck in 2 years 

## 2017-07-09 NOTE — Assessment & Plan Note (Signed)
I reviewed test results with her  So far, her last bone marrow biopsy and recent blood work confirmed complete remission. The patient is comfortable to remain on Revlimid indefinitely. She will continue treatment per research protocol She will continue calcium with vitamin D supplement along with aspirin for DVT prophylaxis. 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 I will see her back in 3 months for further review.

## 2017-07-09 NOTE — Assessment & Plan Note (Signed)
She have chronic bone pain which started with a diagnosis of multiple myeloma. We discussed chronic pain management. MRI did suggest she had evidence of degenerative disc disease and spinal stenosis. After much discussion, the patient is comfortable to remain on chronic narcotic prescription. I refilled her prescriptions We discussed narcotic refill policy 

## 2017-07-10 ENCOUNTER — Other Ambulatory Visit: Payer: Self-pay | Admitting: *Deleted

## 2017-07-16 NOTE — Progress Notes (Signed)
07/07/2017 Patient in to clinic today for evaluation prior to beginning treatment Cycle 34 today. Patient reports ongoing complaints as noted in AE table and list below. Based on lab results review and history and physical by Dr. Alvy Bimler, patient meets criteria for continued treatment with Revlimid. Patient given printed appointment calendar with Cycle 34 treatment days marked, to document doses for the next treatment cycle, which will begin today.  Adverse Event Log Study/Protocol: [facility] ECOG [id number] Maintenance Cycles 31-33: 04/07/2017 - 07/07/2017 (end of Cycle 33= 07/06/17) Event Grade Attribution to lenalidomide Cycle # Comments  Fatigue  Grade 1-2  Probable 31-33 Moderate, occasionally limiting ADLs  Sensory neuropathy  Grade 2 Unlikely 31-33 Moderate symptoms, unchanged from previous  Insomnia  Grade 2  Unlikely 31-33 Moderate difficulty, takes sleeping pill regularly  Neutrophil count decreased Grade 2-3  Definite 31-33    Anemia Grade 1 Definite 31-32    Diarrhea Grade 1 Probable 32 <4/day over baseline, x 2 intermittent days  Dyspnea Grade 1 Unrelated 33 Shortness of breath noted with activity  Non-reportable AEs - unsolicited, < Grade 3 (grade): - Moderate pain (2) . Joint pain - ankle, wrist, hip, knee, shoulder . Back pain . Bone pain . Muscle pain/legs . Tailbone pain . Generalized pain . Lower extremity pain - Leukopenia (1) - Rash, maculopapular, left thorax (1) - Headaches, migraines - moderate (2) - Depression (1-2) - Bradycardia (1) - Lower extremity weakness (1) - Weight loss (1) - Hypertension (2) [name] BSN, RN, [place] 09/03/2017 12:06 PM

## 2017-07-24 ENCOUNTER — Other Ambulatory Visit: Payer: Self-pay | Admitting: Oncology

## 2017-07-24 DIAGNOSIS — C9001 Multiple myeloma in remission: Secondary | ICD-10-CM

## 2017-07-27 ENCOUNTER — Encounter: Payer: Self-pay | Admitting: Hematology and Oncology

## 2017-07-28 ENCOUNTER — Telehealth: Payer: Self-pay | Admitting: *Deleted

## 2017-07-28 ENCOUNTER — Telehealth: Payer: Self-pay | Admitting: Hematology and Oncology

## 2017-07-28 MED ORDER — PREDNISONE 20 MG PO TABS: 20.0000 mg | ORAL_TABLET | Freq: Every day | ORAL | 0 refills | Status: DC

## 2017-07-28 NOTE — Telephone Encounter (Signed)
Scheduled appt per sch message - patient is aware of appt date and time.  

## 2017-07-28 NOTE — Telephone Encounter (Signed)
Pt reports that her rash has gotten worse. Has been using calamine lotion and hydrocortisone cream.  Dr Alvy Bimler wants her to take prednisone 20 mg daily with food for 1 week and take benadryl 25 mg TID for 1 week- do not drive while on benadryl.   Dr Alvy Bimler would like to see her on Tuesday after labs.  Verbalized understanding.  Message to scheduler- 9/4 0930 lab, [place] [address] Dr Alvy Bimler

## 2017-08-04 ENCOUNTER — Encounter: Payer: Self-pay | Admitting: Hematology and Oncology

## 2017-08-04 ENCOUNTER — Other Ambulatory Visit: Payer: Medicare Other

## 2017-08-04 ENCOUNTER — Telehealth: Payer: Self-pay | Admitting: Hematology and Oncology

## 2017-08-04 ENCOUNTER — Ambulatory Visit (HOSPITAL_BASED_OUTPATIENT_CLINIC_OR_DEPARTMENT_OTHER): Payer: Medicare Other | Admitting: Hematology and Oncology

## 2017-08-04 ENCOUNTER — Other Ambulatory Visit: Payer: Self-pay | Admitting: *Deleted

## 2017-08-04 ENCOUNTER — Encounter: Payer: Medicare Other | Admitting: *Deleted

## 2017-08-04 ENCOUNTER — Other Ambulatory Visit (HOSPITAL_BASED_OUTPATIENT_CLINIC_OR_DEPARTMENT_OTHER): Payer: Medicare Other

## 2017-08-04 DIAGNOSIS — C9001 Multiple myeloma in remission: Secondary | ICD-10-CM

## 2017-08-04 DIAGNOSIS — R21 Rash and other nonspecific skin eruption: Secondary | ICD-10-CM | POA: Insufficient documentation

## 2017-08-04 DIAGNOSIS — G893 Neoplasm related pain (acute) (chronic): Secondary | ICD-10-CM | POA: Diagnosis not present

## 2017-08-04 LAB — CBC WITH DIFFERENTIAL/PLATELET
BASO%: 0.1 % (ref 0.0–2.0)
Basophils Absolute: 0 10*3/uL (ref 0.0–0.1)
EOS%: 1.1 % (ref 0.0–7.0)
Eosinophils Absolute: 0.1 10*3/uL (ref 0.0–0.5)
HCT: 38.6 % (ref 34.8–46.6)
HGB: 12 g/dL (ref 11.6–15.9)
LYMPH%: 53.3 % — ABNORMAL HIGH (ref 14.0–49.7)
MCH: 23 pg — ABNORMAL LOW (ref 25.1–34.0)
MCHC: 31.1 g/dL — ABNORMAL LOW (ref 31.5–36.0)
MCV: 74.1 fL — ABNORMAL LOW (ref 79.5–101.0)
MONO#: 0.9 10*3/uL (ref 0.1–0.9)
MONO%: 12.9 % (ref 0.0–14.0)
NEUT#: 2.3 10*3/uL (ref 1.5–6.5)
NEUT%: 32.6 % — ABNORMAL LOW (ref 38.4–76.8)
Platelets: 189 10*3/uL (ref 145–400)
RBC: 5.21 10*6/uL (ref 3.70–5.45)
RDW: 15.4 % — ABNORMAL HIGH (ref 11.2–14.5)
WBC: 7 10*3/uL (ref 3.9–10.3)
lymph#: 3.7 10*3/uL — ABNORMAL HIGH (ref 0.9–3.3)

## 2017-08-04 MED ORDER — LENALIDOMIDE 5 MG PO CAPS CTSU E1A11: ORAL_CAPSULE | ORAL | 0 refills | Status: DC

## 2017-08-04 MED ORDER — OXYCODONE HCL 15 MG PO TABS: 15.0000 mg | ORAL_TABLET | Freq: Three times a day (TID) | ORAL | 0 refills | Status: DC | PRN

## 2017-08-04 MED ORDER — LENALIDOMIDE 5 MG PO CAPS CTSU E1A11
ORAL_CAPSULE | ORAL | 0 refills | Status: DC
Start: 2017-08-04 — End: 2017-08-04

## 2017-08-04 NOTE — Assessment & Plan Note (Signed)
She has recent rash on her skin resembling molluscum contagiosum It has almost completely resolved Recommend conservative management only This is not related to Revlimid

## 2017-08-04 NOTE — Progress Notes (Signed)
[facility] OFFICE PROGRESS NOTE  Patient Care Team: Wenda Low, MD as PCP - General (Internal Medicine) Heath Lark, MD as Consulting Physician (Hematology and Oncology) Benson Norway, RN as Registered Nurse (Oncology)  SUMMARY OF ONCOLOGIC HISTORY: Oncology History   ISS stage 1 IgG lambda subtype (serum albumin 3.6, Beta2 microglobulin 2.32) Durie Salmon Stage 1     Multiple myeloma in remission ([place])   10/10/2013 Imaging    Skeletal survery was negative      11/09/2013 Bone Marrow Biopsy    BM biopsy confirmed myeloma, 76% involved, IgG lambda subtype      12/06/2013 - 08/29/2014 Chemotherapy    Sh received chemo with revlimid, Velcade, Dexamethasone and Zometa. 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      05/05/2014 Imaging    Imaging study of the neck showed no explanation that could cause right neck pain. She is noted to have incidental left upper lung nodule. Plan to repeat imaging study in 3 months.      09/06/2014 Imaging    Bone survey showed no evidence of fracture      09/14/2014 Bone Marrow Biopsy    Bone marrow biopsy showed 8% residual plasma cells by manual count but none on the biopsy specimen      09/26/2014 -  Chemotherapy    She is started on cycle 1 of maintenance Revlimid      01/31/2015 Imaging     chest x-ray showed pneumonia. Treatment was placed on hold.      05/03/2015 Bone Marrow Biopsy    Accession: [id number] repeat bone marrow aspirate and biopsy show 5% residual plasma cells      10/14/2016 Bone Marrow Biopsy    Bone marrow biopsy showed the plasma cells represent 4% of all cells with lack of large aggregates or sheets. To assess the plasma cell clonality, immunohistochemical stains is performed and it lack clonality. Normal  cytogenetics and FISH      01/09/2017 Imaging    CT chest showed ground-glass 1.5 cm apical left upper lobe pulmonary nodule. Initial follow-up with CT at 6-12 months is recommended to confirm persistence. If persistent, repeat CT is recommended every 2 years until 5 years of stability has been established. This recommendation follows the consensus statement: Guidelines for Management of Incidental Pulmonary Nodules Detected on CT Images: From the Fleischner Society 2017; Radiology 2017; 284:228-243. 2. Mild patchy ground-glass opacities in the right upper lobe, probably inflammatory, which can also be reassessed on follow-up chest CT performed for the above dominant ground-glass nodule. 3. Solitary 3 mm right lower lobe solid pulmonary nodule, which can also be reassessed on follow-up chest CT performed for the above dominant ground-glass nodule. 4. No thoracic adenopathy. 5. Aortic atherosclerosis. Two-vessel coronary atherosclerosis.      06/30/2017 Imaging    1. No interval change in the 11 x 13 mm ground-glass nodule left upper lobe. Given the nearly 6 months of imaging stability, repeat CT is recommended every 2 years until 5 years of stability has been established. This recommendation follows the consensus statement: Guidelines for Management of Incidental Pulmonary Nodules Detected on CT Images: From the Fleischner Society 2017; Radiology 2017; 284:228-243. 2. Interval resolution of the tiny patchy ground-glass nodules right upper lobe, likely  infectious/inflammatory etiology. 3. Stable 3 mm right lower lobe pulmonary nodule. Attention on follow-up recommended. 4. Aortic Atherosclerois (ICD10-170.0)       INTERVAL HISTORY: Please see below for problem oriented charting. She returns with her husband for further follow-up She developed skin rash in her face and right upper extremity recently It was itchy and causing significant discomfort She denies fever or chills No recent  diarrhea She was instructed to take prednisone, Benadryl and hydrocortisone cream Since then, her skin rash has resolved She denies recent sick contact. She feels well She continues to have chronic bone pain related to multiple myeloma, stable.  REVIEW OF SYSTEMS:   Constitutional: Denies fevers, chills or abnormal weight loss Eyes: Denies blurriness of vision Ears, nose, mouth, throat, and face: Denies mucositis or sore throat Respiratory: Denies cough, dyspnea or wheezes Cardiovascular: Denies palpitation, chest discomfort or lower extremity swelling Gastrointestinal:  Denies nausea, heartburn or change in bowel habits Lymphatics: Denies new lymphadenopathy or easy bruising Neurological:Denies numbness, tingling or new weaknesses Behavioral/Psych: Mood is stable, no new changes  All other systems were reviewed with the patient and are negative.  I have reviewed the past medical history, past surgical history, social history and family history with the patient and they are unchanged from previous note.  ALLERGIES:  has No Known Allergies.  MEDICATIONS:  Current Outpatient Prescriptions  Medication Sig Dispense Refill  . aspirin 325 MG tablet Take 1 tablet (325 mg total) by mouth daily.    . carboxymethylcellulose (REFRESH PLUS) 0.5 % SOLN Place 1 drop into both eyes daily as needed (for dry eyes).     . Cholecalciferol (VITAMIN D3) 2000 UNITS TABS Take 2,000 Units by mouth daily.    . hydrochlorothiazide (HYDRODIURIL) 25 MG tablet Take 25 mg by mouth every evening.     Marland Kitchen lenalidomide (REVLIMID) 5 MG capsule Take 1 capsule every other day for 21 days, off 7 days. Repeat every 28 days. 11 capsule 0  . levothyroxine (SYNTHROID, LEVOTHROID) 75 MCG tablet Take 75 mcg by mouth daily before breakfast.    . Multiple Vitamins-Minerals (CENTRUM SILVER PO) Take 1 tablet by mouth daily.     . ONGLYZA 5 MG TABS tablet Take 5 mg by mouth daily.     Marland Kitchen oxyCODONE (ROXICODONE) 15 MG immediate  release tablet Take 1 tablet (15 mg total) by mouth 3 (three) times daily as needed for pain. 90 tablet 0  . polyethylene glycol (MIRALAX / GLYCOLAX) packet Take 17 g by mouth daily as needed for mild constipation.     . predniSONE (DELTASONE) 20 MG tablet Take 1 tablet (20 mg total) by mouth daily with breakfast. 7 tablet 0  . RESTASIS 0.05 % ophthalmic emulsion PLACE 1 DROP IN BOTH EYES IN THE EVENING  4  . traMADol (ULTRAM) 50 MG tablet Take 1 tablet (50 mg total) by mouth every 6 (six) hours as needed for moderate pain. 90 tablet 0  . traZODone (DESYREL) 50 MG tablet TAKE 1 TABLET (50 MG TOTAL) BY MOUTH AT BEDTIME. 30 tablet 6  . venlafaxine XR (EFFEXOR-XR) 150 MG 24 hr capsule Take 1 capsule (150 mg total) by mouth daily with breakfast. 90 capsule 1  . verapamil (VERELAN PM) 120 MG 24 hr capsule Take 1 capsule by mouth  daily 30 capsule 11  . vitamin E (VITAMIN E) 1000 UNIT capsule Take 400 Units by mouth daily.      No current facility-administered medications for this visit.    Facility-Administered Medications  Ordered in Other Visits  Medication Dose Route Frequency Provider Last Rate Last Dose  . gadopentetate dimeglumine (MAGNEVIST) injection 15 mL  15 mL Intravenous Once PRN Melvenia Beam, MD        PHYSICAL EXAMINATION: ECOG PERFORMANCE STATUS: 1 - Symptomatic but completely ambulatory  Vitals:   08/04/17 0916  BP: 137/80  Pulse: 70  Resp: 18  Temp: 98.5 F (36.9 C)  SpO2: 99%   Filed Weights   08/04/17 0916  Weight: 174 lb 6.4 oz (79.1 kg)    GENERAL:alert, no distress and comfortable SKIN: She has some mild residual skin lesions on her arms and her face.  No significant blistering. EYES: normal, Conjunctiva are pink and non-injected, sclera clear OROPHARYNX:no exudate, no erythema and lips, buccal mucosa, and tongue normal  NECK: supple, thyroid normal size, non-tender, without nodularity LYMPH:  no palpable lymphadenopathy in the cervical, axillary or  inguinal LUNGS: clear to auscultation and percussion with normal breathing effort HEART: regular rate & rhythm and no murmurs and no lower extremity edema ABDOMEN:abdomen soft, non-tender and normal bowel sounds Musculoskeletal:no cyanosis of digits and no clubbing  NEURO: alert & oriented x 3 with fluent speech, no focal motor/sensory deficits  LABORATORY DATA:  I have reviewed the data as listed    Component Value Date/Time   NA 140 06/30/2017 0802   K 3.6 06/30/2017 0802   CL 105 10/14/2016 0835   CO2 29 06/30/2017 0802   GLUCOSE 90 06/30/2017 0802   BUN 9.4 06/30/2017 0802   CREATININE 0.9 06/30/2017 0802   CALCIUM 9.2 06/30/2017 0802   PROT 7.1 06/30/2017 0802   PROT 6.8 06/30/2017 0802   ALBUMIN 3.5 06/30/2017 0802   AST 23 06/30/2017 0802   ALT 27 06/30/2017 0802   ALKPHOS 49 06/30/2017 0802   BILITOT 0.34 06/30/2017 0802   GFRNONAA >60 10/14/2016 0835   GFRAA >60 10/14/2016 0835    No results found for: SPEP, UPEP  Lab Results  Component Value Date   WBC 7.0 08/04/2017   NEUTROABS 2.3 08/04/2017   HGB 12.0 08/04/2017   HCT 38.6 08/04/2017   MCV 74.1 (L) 08/04/2017   PLT 189 08/04/2017      Chemistry      Component Value Date/Time   NA 140 06/30/2017 0802   K 3.6 06/30/2017 0802   CL 105 10/14/2016 0835   CO2 29 06/30/2017 0802   BUN 9.4 06/30/2017 0802   CREATININE 0.9 06/30/2017 0802      Component Value Date/Time   CALCIUM 9.2 06/30/2017 0802   ALKPHOS 49 06/30/2017 0802   AST 23 06/30/2017 0802   ALT 27 06/30/2017 0802   BILITOT 0.34 06/30/2017 0802       ASSESSMENT & PLAN:  Multiple myeloma in remission ([place]) I reviewed test results with her  So far, her last bone marrow biopsy and recent blood work confirmed complete remission. The patient is comfortable to remain on Revlimid indefinitely. She will continue treatment per research protocol She will continue calcium with vitamin D supplement along with aspirin for DVT prophylaxis. She is  not receiving IV Zometa due to history of osteonecrosis of the jaw. I will see her back as scheduled  Rash in adult She has recent rash on her skin resembling molluscum contagiosum It has almost completely resolved Recommend conservative management only This is not related to Revlimid  Cancer associated pain  She have chronic bone pain which started with a diagnosis of multiple myeloma. We discussed chronic  pain management. MRI did suggest she had evidence of degenerative disc disease and spinal stenosis. After much discussion, the patient is comfortable to remain on chronic narcotic prescription. I refilled her prescriptions We discussed narcotic refill policy   No orders of the defined types were placed in this encounter.  All questions were answered. The patient knows to call the clinic with any problems, questions or concerns. No barriers to learning was detected. I spent 15 minutes counseling the patient face to face. The total time spent in the appointment was 20 minutes and more than 50% was on counseling and review of test results     Heath Lark, MD 08/04/2017 3:22 PM

## 2017-08-04 NOTE — Telephone Encounter (Signed)
No 9/4 los. °

## 2017-08-04 NOTE — Assessment & Plan Note (Signed)
I reviewed test results with her  So far, her last bone marrow biopsy and recent blood work confirmed complete remission. The patient is comfortable to remain on Revlimid indefinitely. She will continue treatment per research protocol She will continue calcium with vitamin D supplement along with aspirin for DVT prophylaxis. 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 I will see her back as scheduled 

## 2017-08-04 NOTE — Progress Notes (Signed)
08/04/2017 Patient in to clinic today for evaluation prior to beginning maintenance treatment cycle 35. Patient seen by MD due to development of rash on right forearm during the previous treatment cycle. Per assessment by MD, rash felt to be unrelated to lenalidomide. Patient was encouraged to avoid swimming in public pool for the next week. As previously noted, patient is scheduled for her trip to [place] next week. Due to development and treatment of rash last week, patient did not take final dose of lenalidomide during cycle 34. Also, lenalidmide was not refilled for the same reason. Prescription will be re-ordered today for continued dosing, per Dr. Alvy Bimler. While awaiting receipt of prescription, patient will begin dosing today with the remaining tablet from last week. Patient encouraged to call for any problems or concerns prior to her next visit. [name] BSN, RN, [place] 08/04/2017 10:02 AM  Adverse Event Log Study/Protocol: [facility] ECOG E1A11 Maintenance Cycle 34: 07/07/2017 - 08/04/2017 Event Grade Comments  Fatigue  Grade 2  Moderate, occasionally limiting ADLs  Sensory neuropathy  Grade 2 Moderate symptoms, unchanged from previous  Insomnia  Grade 2  Moderate difficulty, takes sleeping pill regularly.  Nausea Grade 1 Mild nausea x 1 day   Non-reportable AEs (grade): - Moderate pain, unchanged in severity from previous (2)  Back pain  Bone pain  Joint pain - hip, knee - Bruising, arm (1) - Right leg cramps, mild (1) - Rash, left forearm (1) [name] BSN, RN, [place] 09/02/2017 2:58 PM

## 2017-08-04 NOTE — Assessment & Plan Note (Signed)
She have chronic bone pain which started with a diagnosis of multiple myeloma. We discussed chronic pain management. MRI did suggest she had evidence of degenerative disc disease and spinal stenosis. After much discussion, the patient is comfortable to remain on chronic narcotic prescription. I refilled her prescriptions We discussed narcotic refill policy 

## 2017-08-05 ENCOUNTER — Other Ambulatory Visit: Payer: Self-pay | Admitting: Oncology

## 2017-08-05 DIAGNOSIS — C9001 Multiple myeloma in remission: Secondary | ICD-10-CM

## 2017-08-06 ENCOUNTER — Other Ambulatory Visit: Payer: Self-pay | Admitting: *Deleted

## 2017-08-06 DIAGNOSIS — C9001 Multiple myeloma in remission: Secondary | ICD-10-CM

## 2017-08-06 MED ORDER — LENALIDOMIDE 5 MG PO CAPS: ORAL_CAPSULE | ORAL | 0 refills | Status: DC

## 2017-08-24 ENCOUNTER — Other Ambulatory Visit: Payer: Self-pay | Admitting: Oncology

## 2017-08-24 DIAGNOSIS — C9001 Multiple myeloma in remission: Secondary | ICD-10-CM

## 2017-08-30 ENCOUNTER — Other Ambulatory Visit: Payer: Self-pay | Admitting: Hematology and Oncology

## 2017-09-01 ENCOUNTER — Other Ambulatory Visit (HOSPITAL_BASED_OUTPATIENT_CLINIC_OR_DEPARTMENT_OTHER): Payer: Medicare Other

## 2017-09-01 ENCOUNTER — Other Ambulatory Visit: Payer: Self-pay

## 2017-09-01 ENCOUNTER — Encounter: Payer: Medicare Other | Admitting: *Deleted

## 2017-09-01 DIAGNOSIS — C9001 Multiple myeloma in remission: Secondary | ICD-10-CM

## 2017-09-01 DIAGNOSIS — Z006 Encounter for examination for normal comparison and control in clinical research program: Secondary | ICD-10-CM | POA: Diagnosis not present

## 2017-09-01 LAB — CBC WITH DIFFERENTIAL/PLATELET
BASO%: 0.6 % (ref 0.0–2.0)
Basophils Absolute: 0 10*3/uL (ref 0.0–0.1)
EOS%: 3.3 % (ref 0.0–7.0)
Eosinophils Absolute: 0.1 10*3/uL (ref 0.0–0.5)
HCT: 37.4 % (ref 34.8–46.6)
HGB: 11.4 g/dL — ABNORMAL LOW (ref 11.6–15.9)
LYMPH%: 55.6 % — ABNORMAL HIGH (ref 14.0–49.7)
MCH: 22.9 pg — ABNORMAL LOW (ref 25.1–34.0)
MCHC: 30.5 g/dL — ABNORMAL LOW (ref 31.5–36.0)
MCV: 75.1 fL — ABNORMAL LOW (ref 79.5–101.0)
MONO#: 0.5 10*3/uL (ref 0.1–0.9)
MONO%: 14.7 % — ABNORMAL HIGH (ref 0.0–14.0)
NEUT#: 0.9 10*3/uL — ABNORMAL LOW (ref 1.5–6.5)
NEUT%: 25.8 % — ABNORMAL LOW (ref 38.4–76.8)
Platelets: 194 10*3/uL (ref 145–400)
RBC: 4.98 10*6/uL (ref 3.70–5.45)
RDW: 15.5 % — ABNORMAL HIGH (ref 11.2–14.5)
WBC: 3.6 10*3/uL — ABNORMAL LOW (ref 3.9–10.3)
lymph#: 2 10*3/uL (ref 0.9–3.3)

## 2017-09-01 MED ORDER — LENALIDOMIDE 5 MG PO CAPS: ORAL_CAPSULE | ORAL | 0 refills | Status: DC

## 2017-09-01 NOTE — Progress Notes (Signed)
09/01/2017 Patient in to clinic today for assessment prior to beginning maintenance treatment cycle 36. Patient returned completed cycle 35Medication Calendar, confirming correct dosing as prescribed(dose level -3, Revlimid 5mg  every other day, Days 1-21), however, due to delay in her prescription refill, the Day 3 dose on 08/06/17 was missed, for a total of 10 doses during the treatment cycle.   Patient's ANC was 900, and therefore not high enough to begin treatment today. Patient was notified to hold Revlimid, and she will return to clinic in one week on 09/08/2017 for repeat CBC, as confirmed by Dr. Alvy Bimler. Patient verbalized understanding. Also discussed Revlimid refill, as communicated by Harrel Lemon RN. Refill was requested today, and per Ms. Johnson, patient should contact the office on the day of her last dose for the cycle (Day 21), to ensure the refill process is in place. Of note, patient currently has two doses, due to one dose missed in each of the last two cycles.  Patient had a lengthy trip to [place] and had a lot of activity with her four-year-old grandson, which she felt impacted her fatigue level, though it was not felt to be any worse than usual.  Jenny Reichmann S. Brigitte Pulse BSN, RN, [place] 09/01/2017 9:23 AM

## 2017-09-07 ENCOUNTER — Telehealth: Payer: Self-pay | Admitting: Neurology

## 2017-09-07 NOTE — Telephone Encounter (Signed)
Pt states that Dr Jaynee Eagles wanted her to begin Aimovig.  Pt states she reached them and was told that Dr Jaynee Eagles needs to correct a diagnosis code on Page 2 section 4 in order for her to receive the medication.

## 2017-09-08 ENCOUNTER — Encounter: Payer: Self-pay | Admitting: Hematology and Oncology

## 2017-09-08 ENCOUNTER — Telehealth: Payer: Self-pay | Admitting: Hematology and Oncology

## 2017-09-08 ENCOUNTER — Encounter: Payer: Medicare Other | Admitting: *Deleted

## 2017-09-08 ENCOUNTER — Other Ambulatory Visit (HOSPITAL_BASED_OUTPATIENT_CLINIC_OR_DEPARTMENT_OTHER): Payer: Medicare Other

## 2017-09-08 DIAGNOSIS — C9001 Multiple myeloma in remission: Secondary | ICD-10-CM

## 2017-09-08 DIAGNOSIS — Z006 Encounter for examination for normal comparison and control in clinical research program: Secondary | ICD-10-CM | POA: Diagnosis not present

## 2017-09-08 LAB — CBC WITH DIFFERENTIAL/PLATELET
BASO%: 0.3 % (ref 0.0–2.0)
Basophils Absolute: 0 10*3/uL (ref 0.0–0.1)
EOS%: 2.2 % (ref 0.0–7.0)
Eosinophils Absolute: 0.1 10*3/uL (ref 0.0–0.5)
HCT: 38.5 % (ref 34.8–46.6)
HGB: 11.9 g/dL (ref 11.6–15.9)
LYMPH%: 55.3 % — ABNORMAL HIGH (ref 14.0–49.7)
MCH: 23.2 pg — ABNORMAL LOW (ref 25.1–34.0)
MCHC: 30.9 g/dL — ABNORMAL LOW (ref 31.5–36.0)
MCV: 75 fL — ABNORMAL LOW (ref 79.5–101.0)
MONO#: 0.4 10*3/uL (ref 0.1–0.9)
MONO%: 10.4 % (ref 0.0–14.0)
NEUT#: 1.1 10*3/uL — ABNORMAL LOW (ref 1.5–6.5)
NEUT%: 31.8 % — ABNORMAL LOW (ref 38.4–76.8)
Platelets: 222 10*3/uL (ref 145–400)
RBC: 5.13 10*6/uL (ref 3.70–5.45)
RDW: 15.6 % — ABNORMAL HIGH (ref 11.2–14.5)
WBC: 3.6 10*3/uL — ABNORMAL LOW (ref 3.9–10.3)
lymph#: 2 10*3/uL (ref 0.9–3.3)
nRBC: 0 % (ref 0–0)

## 2017-09-08 MED ORDER — ERENUMAB-AOOE 70 MG/ML ~~LOC~~ SOAJ: 1.0000 "pen " | SUBCUTANEOUS | 11 refills | Status: DC

## 2017-09-08 MED ORDER — LENALIDOMIDE 5 MG PO CAPS CTSU E1A11: ORAL_CAPSULE | ORAL | 0 refills | Status: DC

## 2017-09-08 NOTE — Progress Notes (Signed)
09/08/2017 Patient in to clinic today for re-evaluation prior to beginning maintenance treatment Cycle 36, following delay last week due to grade 3 neutropenia. Met with patient who confirms receipt of her lenalidomide prescriptions by overnight delivery last week. Patient given an updated appointment calendar with Cycle 36treatment days marked, to document doses for the next treatment cycle, which will begin today only if counts are acceptable for continued treatment. Patient is aware that she should await instruction from the research nurse before proceeding with dosing today. Patient denies any new symptoms over the past week, and states that she returned to swimming yesterday for the first time since her recent trip and resolution of skin rash. [name] BSN, RN, [facility] 09/08/2017 8:02 AM   09/08/2017 Spoke with patient by phone after receiving CBC results, with ANC of 1100, acceptable for continued treatment. Patient will resume dosing with lenalidomide every other day, starting today. In addition, previously scheduled appointments for October will be moved due to treatment delay. Confirmed above with Dr. Alvy Bimler. [name] BSN, RN, [place] 09/08/2017 11:02 AM  Adverse Event Log Study/Protocol: [facility] ECOG E1A11 Maintenance Cycle 35: 08/04/2017 - 09/08/2017 Event Grade Comments  Fatigue  Grade 2  Moderate, occasionally limiting ADLs  Sensory neuropathy  Grade 2 Moderate symptoms, unchanged from previous  Insomnia  Grade 2  Moderate difficulty, takes sleeping pill regularly.  Neutrophil count decreased Grade 3     Nausea Grade 1 X 1 day, minimal effect on oral intake  Anemia Grade 1     Non-reportable AEs (grade): - Moderate pain (2)  Back pain  Bone pain  Wrist pain  Right knee pain - Leukopenia (1) [name] BSN, RN, [facility] 09/08/2017 2:11 PM

## 2017-09-08 NOTE — Telephone Encounter (Signed)
Spoke to patient - she is aware that her Aimovig prescription has been sent to her local pharmacy.  Also, instructed her to sign up for access card and take it with her to pick up the prescription.

## 2017-09-08 NOTE — Telephone Encounter (Signed)
Called patient regarding October 31st

## 2017-09-08 NOTE — Telephone Encounter (Signed)
Called patient regarding October and November  °

## 2017-09-08 NOTE — Addendum Note (Signed)
Addended by: Noberto Retort C on: 09/08/2017 02:57 PM   Modules accepted: Orders

## 2017-09-09 NOTE — Telephone Encounter (Signed)
PA request sent to [facility] for Aimovig 70 mg injection. Should have a determination within the next 72 hours. PA key # [id number]

## 2017-09-11 ENCOUNTER — Telehealth: Payer: Self-pay

## 2017-09-11 NOTE — Telephone Encounter (Signed)
Called and left message. She wants a refill on her Oxycodone Rx. She will pick Monday.

## 2017-09-14 ENCOUNTER — Encounter: Payer: Self-pay | Admitting: Hematology and Oncology

## 2017-09-14 ENCOUNTER — Other Ambulatory Visit: Payer: Self-pay | Admitting: Hematology and Oncology

## 2017-09-14 MED ORDER — OXYCODONE HCL 15 MG PO TABS: 15.0000 mg | ORAL_TABLET | Freq: Three times a day (TID) | ORAL | 0 refills | Status: DC | PRN

## 2017-09-14 NOTE — Telephone Encounter (Signed)
Patient is calling stating she received a letter from Medicare denying [place]. Please call and discuss.

## 2017-09-14 NOTE — Telephone Encounter (Signed)
Called patient and let her know that we are aware and will f/u.  PA for Aimovig denied through [facility] Key # [id number]

## 2017-09-15 ENCOUNTER — Telehealth: Payer: Self-pay

## 2017-09-15 NOTE — Telephone Encounter (Signed)
Cyril Mourning- can you call the pharmacy to see if she was signed up for bridge program to get free samples?

## 2017-09-15 NOTE — Telephone Encounter (Signed)
I called [facility] on [address], spoke to the pharmacist. She reports that they received no education on signing the pts on aimovig up for any bridge program or access card. I directed her to the note on the aimovig RX and asked her to complete this. I will also let the aimovig rep know of this concern.

## 2017-09-15 NOTE — Telephone Encounter (Signed)
Do they get a free year if they are not commercial insurance?

## 2017-09-15 NOTE — Telephone Encounter (Signed)
Yes, call the rep and let her know. Or if she has not started the Aimovig or we just switch her to Ajovy.

## 2017-09-15 NOTE — Telephone Encounter (Signed)
Spoke with patient today per Jenny Reichmann and her Bone Survey and lab appts have been adjusted.

## 2017-09-16 ENCOUNTER — Other Ambulatory Visit: Payer: Self-pay | Admitting: Neurology

## 2017-09-16 MED ORDER — NONFORMULARY OR COMPOUNDED ITEM: 1.0000 "application " | 11 refills | Status: DC

## 2017-09-16 NOTE — Telephone Encounter (Signed)
Which pharmacy? She has multiple. I don;t think I can send it to [facility], I can use the once in [place] please verify

## 2017-09-16 NOTE — Telephone Encounter (Signed)
Carla Perry,  Can you let this pt know that her aimovig was denied by her insurance, but the [employer] has told us that they will cover 6 months of aimovig free if she is signed up for the access card/bridge program, if pt has already started taking the aimovig. Pt will also need a follow up appt within 5 months to continue aimovig. Can you make sure that pt has been signed up for the access card and get her scheduled with Dr. Jaynee Eagles within 5 months if she wants to continue aimovig.  If pt has not started aimovig, Dr. Jaynee Eagles said we could switch her to ajovy.

## 2017-09-16 NOTE — Telephone Encounter (Signed)
I spoke with patient and she is aware that the Aimovig has been denied by her insurance. She has not been on the Aimovig and is agreeable in switching to Ajovy. I have put the order for Ajovy in, but do not know the dosing, could you please complete this? Thanks

## 2017-09-16 NOTE — Addendum Note (Signed)
Addended by: Belinda Block A on: 09/16/2017 11:39 AM   Modules accepted: Orders

## 2017-09-16 NOTE — Telephone Encounter (Signed)
I called pt to discuss. No answer, left a message asking her to call me back. 

## 2017-09-17 MED ORDER — FREMANEZUMAB-VFRM 225 MG/1.5ML ~~LOC~~ SOSY: 1.0000 "pen " | PREFILLED_SYRINGE | SUBCUTANEOUS | 11 refills | Status: DC

## 2017-09-17 NOTE — Telephone Encounter (Signed)
Patient is aware of the new medication and how to take it. [facility].

## 2017-09-17 NOTE — Addendum Note (Signed)
Addended by: Belinda Block A on: 09/17/2017 11:15 AM   Modules accepted: Orders

## 2017-09-19 ENCOUNTER — Other Ambulatory Visit: Payer: Self-pay

## 2017-09-19 ENCOUNTER — Emergency Department (HOSPITAL_COMMUNITY)
Admission: EM | Admit: 2017-09-19 | Discharge: 2017-09-19 | Disposition: A | Discharge status: Home / Self Care | Payer: Medicare Other | Attending: Emergency Medicine | Admitting: Emergency Medicine

## 2017-09-19 ENCOUNTER — Encounter (HOSPITAL_COMMUNITY): Payer: Self-pay | Admitting: Emergency Medicine

## 2017-09-19 ENCOUNTER — Emergency Department (HOSPITAL_COMMUNITY): Payer: Medicare Other

## 2017-09-19 DIAGNOSIS — R0600 Dyspnea, unspecified: Secondary | ICD-10-CM | POA: Insufficient documentation

## 2017-09-19 DIAGNOSIS — E099 Drug or chemical induced diabetes mellitus without complications: Secondary | ICD-10-CM | POA: Diagnosis not present

## 2017-09-19 DIAGNOSIS — C9001 Multiple myeloma in remission: Secondary | ICD-10-CM | POA: Insufficient documentation

## 2017-09-19 DIAGNOSIS — G620 Drug-induced polyneuropathy: Secondary | ICD-10-CM | POA: Diagnosis not present

## 2017-09-19 DIAGNOSIS — Z7982 Long term (current) use of aspirin: Secondary | ICD-10-CM | POA: Diagnosis not present

## 2017-09-19 DIAGNOSIS — Z79899 Other long term (current) drug therapy: Secondary | ICD-10-CM | POA: Insufficient documentation

## 2017-09-19 DIAGNOSIS — R0602 Shortness of breath: Secondary | ICD-10-CM | POA: Diagnosis present

## 2017-09-19 LAB — BASIC METABOLIC PANEL
Anion gap: 10 (ref 5–15)
BUN: 10 mg/dL (ref 6–20)
CO2: 26 mmol/L (ref 22–32)
Calcium: 9.1 mg/dL (ref 8.9–10.3)
Chloride: 102 mmol/L (ref 101–111)
Creatinine, Ser: 0.78 mg/dL (ref 0.44–1.00)
GFR calc Af Amer: >60 mL/min (ref >60)
GFR calc non Af Amer: >60 mL/min (ref >60)
Glucose, Bld: 108 mg/dL — ABNORMAL HIGH (ref 65–99)
Potassium: 3.8 mmol/L (ref 3.5–5.1)
Sodium: 138 mmol/L (ref 135–145)

## 2017-09-19 LAB — I-STAT TROPONIN, ED: Troponin i, poc: 0 ng/mL (ref 0.00–0.08)

## 2017-09-19 LAB — BRAIN NATRIURETIC PEPTIDE: B Natriuretic Peptide: 29.4 pg/mL (ref 0.0–100.0)

## 2017-09-19 LAB — CBC
HCT: 38.7 % (ref 36.0–46.0)
Hemoglobin: 12.4 g/dL (ref 12.0–15.0)
MCH: 23.4 pg — ABNORMAL LOW (ref 26.0–34.0)
MCHC: 32 g/dL (ref 30.0–36.0)
MCV: 73 fL — ABNORMAL LOW (ref 78.0–100.0)
Platelets: 252 10*3/uL (ref 150–400)
RBC: 5.3 MIL/uL — ABNORMAL HIGH (ref 3.87–5.11)
RDW: 15.3 % (ref 11.5–15.5)
WBC: 4.1 10*3/uL (ref 4.0–10.5)

## 2017-09-19 MED ORDER — IOPAMIDOL (ISOVUE-370) INJECTION 76%
INTRAVENOUS | Status: AC
  Administered 2017-09-19: 100 mL
  Filled 2017-09-19: qty 100

## 2017-09-19 NOTE — ED Triage Notes (Signed)
Patient is currently taking chemo pills and has felt cold like symptoms and was concerned she may have the flu. States she has had it the past two years. C/o shortness of breath on the left side and difficulty taking a deep breath.

## 2017-09-19 NOTE — ED Provider Notes (Signed)
[facility] DEPT Provider Note   CSN: [id number] Arrival date & time: 09/19/17  1205     History   Chief Complaint Chief Complaint  Patient presents with  . Shortness of Breath  . Cancer patient    HPI Carla Perry is a 61 y.o. female.  HPI Patient presents to the emergency room for evaluation of shortness of breath and a sensation of not being able take a deep breath on the left side. Patient has a history of cancer that is in remission. She is still taking oral chemotherapy pills. Patient started having some URI congestion symptoms a couple days ago. She denies any fevers. When she started having the difficulty breathing she decided to come to the emergency room for evaluation. She denies any chest pain. No leg swelling. She does have a history of DVT and is currently not on any anticoagulation. Past Medical History:  Diagnosis Date  . Abnormal thyroid function test 08/30/2014  . Anemia   . Anemia, unspecified 10/06/2013  . Bone pain 10/21/2013  . Bronchitis 01/31/2015  . Diabetes mellitus without complication ([place]) 25/4270   Steroid induced diabetes. has not picked oral med up from pharmacy as of 09-14-14  . Diverticulitis 07/18/2014  . DVT (deep venous thrombosis) ([place]) 02/07/2014  . HBP (high blood pressure)   . Insomnia 02/11/2016  . Leukopenia due to antineoplastic chemotherapy ([place]) 12/27/2013  . Memory loss   . MGUS (monoclonal gammopathy of unknown significance)   . MGUS (monoclonal gammopathy of unknown significance) 10/06/2013  . Migraine   . Multiple myeloma, without mention of having achieved remission 11/16/2013  . Pancytopenia ([place]) 12/27/2015  . Peripheral neuropathy 12/27/2015  . Right leg swelling 02/07/2014  . Seizure ([place]) 1960   single seizure episode at age 14  . Thyroid disorder   . Vitamin D deficiency 10/24/2014    Patient Active Problem List   Diagnosis Date Noted  . Rash in adult 08/04/2017  . Cancer associated pain  01/13/2017  . Chemotherapy induced neutropenia ([place]) 12/25/2016  . Lung nodule seen on imaging study 12/25/2016  . Granuloma, pyogenic 09/03/2016  . Lumbar pain 08/27/2016  . Paresthesias 03/19/2016  . Headache 03/19/2016  . Insomnia 02/11/2016  . Peripheral neuropathy 12/27/2015  . Pancytopenia ([place]) 12/27/2015  . Disability examination 03/22/2015  . Migraine without aura 02/16/2015  . 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 Influenza B 01/31/2015  . Malignant bone pain 12/26/2014  . Vitamin D deficiency 10/24/2014  . Abnormal thyroid function test 08/30/2014  . Fatigue 08/23/2014  . Generalized postprandial abdominal pain 08/22/2014  . Hypokalemia 07/25/2014  . Diverticulitis 07/18/2014  . Hematochezia due to medication 05/23/2014  . Neck pain on right side 05/02/2014  . 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 Drug-induced pancytopenia ([place]) 05/02/2014  . Right leg swelling 02/07/2014  . DVT (deep venous thrombosis) ([place]) 02/07/2014  . Multiple myeloma in remission ([place]) 11/16/2013  . Bone pain 10/21/2013  . 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 Intractable chronic migraine without aura 06/28/2013    Past Surgical History:  Procedure Laterality Date  . HEMORRHOID SURGERY    . PORT-A-CATH REMOVAL  10-2014  . PORTACATH PLACEMENT Right jan 2015    OB History    No data available       Home Medications    Prior to Admission medications   Medication Sig Start Date End Date Taking? Authorizing Provider  aspirin 325 MG tablet Take 1 tablet (  325 mg total) by mouth daily. 08/14/16  Yes Gorsuch, Ernst Spell, MD  Cholecalciferol (VITAMIN D3) 2000 UNITS TABS Take 2,000 Units by mouth daily.   Yes [provider]  Dextromethorphan-Guaifenesin ([place] FAST-MAX DM MAX) 5-100 MG/5ML LIQD Take 10 mLs by mouth daily as needed (cough).   Yes [provider]  hydrochlorothiazide (HYDRODIURIL) 25 MG tablet Take 25 mg by mouth every evening.     Yes [provider]  lenalidomide (REVLIMID) 5 MG capsule Take 1 capsule every other day for 21 days, off 7 days. Repeat every 28 days. 09/08/17  Yes Gorsuch, Ni, MD  levothyroxine (SYNTHROID, LEVOTHROID) 75 MCG tablet Take 75 mcg by mouth daily before breakfast.   Yes [provider]  Multiple Vitamins-Minerals (CENTRUM SILVER PO) Take 1 tablet by mouth daily.    Yes [provider]  ONGLYZA 5 MG TABS tablet Take 5 mg by mouth daily.  01/29/15  Yes [provider]  oxyCODONE (ROXICODONE) 15 MG immediate release tablet Take 1 tablet (15 mg total) by mouth 3 (three) times daily as needed for pain. 09/14/17  Yes Gorsuch, Ni, MD  polyethylene glycol (MIRALAX / GLYCOLAX) packet Take 17 g by mouth daily as needed for mild constipation.    Yes [provider]  RESTASIS 0.05 % ophthalmic emulsion PLACE 1 DROP IN BOTH EYES IN THE EVENING 07/28/16  Yes [provider]  traZODone (DESYREL) 50 MG tablet TAKE 1 TABLET (50 MG TOTAL) BY MOUTH AT BEDTIME. 08/31/17  Yes Alvy Bimler, Ni, MD  venlafaxine XR (EFFEXOR-XR) 150 MG 24 hr capsule Take 1 capsule (150 mg total) by mouth daily with breakfast. 12/24/16  Yes Melvenia Beam, MD  verapamil (VERELAN PM) 120 MG 24 hr capsule Take 1 capsule by mouth  daily 06/19/15  Yes Melvenia Beam, MD  vitamin E (VITAMIN E) 1000 UNIT capsule Take 400 Units by mouth daily.    Yes [provider]  Fremanezumab-vfrm (AJOVY) 225 MG/1.5ML SOSY Inject 1 pen into the skin every 30 (thirty) days. 09/17/17   Melvenia Beam, MD  NONFORMULARY OR COMPOUNDED ITEM Inject 1 application into the skin every 30 (thirty) days. Patient not taking: Reported on 09/19/2017 09/16/17   Melvenia Beam, MD  predniSONE (DELTASONE) 20 MG tablet Take 1 tablet (20 mg total) by mouth daily with breakfast. Patient not taking: Reported on 09/19/2017 07/28/17   Heath Lark, MD  traMADol (ULTRAM) 50 MG tablet Take 1 tablet (50 mg total) by mouth every 6  (six) hours as needed for moderate pain. Patient not taking: Reported on 09/19/2017 07/07/17   Heath Lark, MD    Family History Family History  Problem Relation Age of Onset  . Throat cancer Father   . Stroke Father   . Cancer Brother        prostate    Social History Social History  Substance Use Topics  . Smoking status: Never Smoker  . Smokeless tobacco: Never Used  . Alcohol use No     Allergies   Patient has no known allergies.   Review of Systems Review of Systems  All other systems reviewed and are negative.    Physical Exam Updated Vital Signs BP (!) 147/96 (BP Location: Left Arm)   Pulse 80   Temp 98.3 F (36.8 C)   Resp 18   SpO2 100%   Physical Exam  Constitutional: She appears well-developed and well-nourished. No distress.  HENT:  Head: Normocephalic and atraumatic.  Right Ear: External ear normal.  Left Ear: External ear normal.  Eyes: Conjunctivae are normal. Right eye exhibits no discharge. Left eye exhibits no discharge. No scleral icterus.  Neck: Neck supple. No tracheal deviation present.  Cardiovascular: Normal rate, regular rhythm and intact distal pulses.   Pulmonary/Chest: Effort normal and breath sounds normal. No stridor. No respiratory distress. She has no wheezes. She has no rales.  Abdominal: Soft. Bowel sounds are normal. She exhibits no distension. There is no tenderness. There is no rebound and no guarding.  Musculoskeletal: She exhibits no edema or tenderness.  Neurological: She is alert. She has normal strength. No cranial nerve deficit (no facial droop, extraocular movements intact, no slurred speech) or sensory deficit. She exhibits normal muscle tone. She displays no seizure activity. Coordination normal.  Skin: Skin is warm and dry. No rash noted.  Psychiatric: She has a normal mood and affect.  Nursing note and vitals reviewed.    ED Treatments / Results  Labs (all labs ordered are listed, but only abnormal results are  displayed) Labs Reviewed  CBC  BASIC METABOLIC PANEL  BRAIN NATRIURETIC PEPTIDE  D-DIMER, QUANTITATIVE (NOT AT [facility])  I-STAT TROPONIN, ED    Radiology Dg Chest 2 View  Result Date: 09/19/2017 CLINICAL DATA:  Shortness of breath, upper respiratory symptoms EXAM: CHEST  2 VIEW COMPARISON:  01/02/2017 FINDINGS: The heart size and mediastinal contours are within normal limits. Both lungs are clear. The visualized skeletal structures are unremarkable. IMPRESSION: No active cardiopulmonary disease. Electronically Signed   By: Jerilynn Mages.  Shick M.D.   On: 09/19/2017 13:48   EKG  EKG Interpretation  Date/Time:  Saturday September 19 2017 13:35:56 EDT Ventricular Rate:  54 PR Interval:    QRS Duration: 105 QT Interval:  457 QTC Calculation: 434 R Axis:   11 Text Interpretation:  Sinus rhythm RSR' in V1 or V2, probably normal variant Probable left ventricular hypertrophy Nonspecific T abnormalities, diffuse leads No significant change since last tracing Confirmed by Dorie Rank [phone]) on 09/19/2017 4:01:30 PM       Procedures Procedures (including critical care time)  Medications Ordered in ED Medications - No data to display   Initial Impression / Assessment and Plan / ED Course  I have reviewed the triage vital signs and the nursing notes.  Pertinent labs & imaging results that were available during my care of the patient were reviewed by me and considered in my medical decision making (see chart for details).  Clinical Course as of Sep 19 1600  Sat Sep 19, 2017  1520 Patient is moderate risk per well's criteria  [JK]    Clinical Course User Index [JK] Dorie Rank, MD    Patient presents to emergency room with complaints of shortness of breath. She's had some URI symptoms with cough and congestion but this has been rather mild. Concerned about her prior DVT history and her history of malignancy. Sexual does not show any pneumonia. Plan on laboratory tests including a D-dimer test.   Will turn over care   Final Clinical Impressions(s) / ED Diagnoses  pending   Dorie Rank, MD 09/19/17 [phone]

## 2017-09-19 NOTE — ED Provider Notes (Signed)
Complains of "cold-like symptoms."Sneeze cough congestion. Taking Mucinex. Also complains of pain at left chest in midaxillary line when she takes a deep breath.patient alert no wrist or distress. Speaks in paragraphs.  CT angiogram negative for pulmonary embolism.  Plan Tylenol for aches. I've told her that it is okay to skip tomorrow's "chemotherapy pill and that she should call her oncologist in 2 days to determine whether she should continue. Tylenol for pain. Blood pressure recheck 3 weeks Results for orders placed or performed during the hospital encounter of 09/19/17  CBC  Result Value Ref Range   WBC 4.1 4.0 - 10.5 K/uL   RBC 5.30 (H) 3.87 - 5.11 MIL/uL   Hemoglobin 12.4 12.0 - 15.0 g/dL   HCT 38.7 36.0 - 46.0 %   MCV 73.0 (L) 78.0 - 100.0 fL   MCH 23.4 (L) 26.0 - 34.0 pg   MCHC 32.0 30.0 - 36.0 g/dL   RDW 15.3 11.5 - 15.5 %   Platelets 252 150 - 400 K/uL  Basic metabolic panel  Result Value Ref Range   Sodium 138 135 - 145 mmol/L   Potassium 3.8 3.5 - 5.1 mmol/L   Chloride 102 101 - 111 mmol/L   CO2 26 22 - 32 mmol/L   Glucose, Bld 108 (H) 65 - 99 mg/dL   BUN 10 6 - 20 mg/dL   Creatinine, Ser 0.78 0.44 - 1.00 mg/dL   Calcium 9.1 8.9 - 10.3 mg/dL   GFR calc non Af Amer >60 >60 mL/min   GFR calc Af Amer >60 >60 mL/min   Anion gap 10 5 - 15  Brain natriuretic peptide  Result Value Ref Range   B Natriuretic Peptide 29.4 0.0 - 100.0 pg/mL  I-stat troponin, ED  Result Value Ref Range   Troponin i, poc 0.00 0.00 - 0.08 ng/mL   Comment 3           *Note: Due to a large number of results and/or encounters for the requested time period, some results have not been displayed. A complete set of results can be found in Results Review.   Dg Chest 2 View  Result Date: 09/19/2017 CLINICAL DATA:  Shortness of breath, upper respiratory symptoms EXAM: CHEST  2 VIEW COMPARISON:  01/02/2017 FINDINGS: The heart size and mediastinal contours are within normal limits. Both lungs are  clear. The visualized skeletal structures are unremarkable. IMPRESSION: No active cardiopulmonary disease. Electronically Signed   By: Jerilynn Mages.  Shick M.D.   On: 09/19/2017 13:48   Ct Angio Chest Pe W And/or Wo Contrast  Result Date: 09/19/2017 CLINICAL DATA:  Shortness of breath. EXAM: CT ANGIOGRAPHY CHEST WITH CONTRAST TECHNIQUE: Multidetector CT imaging of the chest was performed using the standard protocol during bolus administration of intravenous contrast. Multiplanar CT image reconstructions and MIPs were obtained to evaluate the vascular anatomy. CONTRAST:  100 mL of Isovue 370 COMPARISON:  Chest x-ray from earlier today and chest CT June 30, 2017 FINDINGS: Cardiovascular: Minimal atherosclerotic change in the thoracic aorta without aneurysm or dissection. The heart is unremarkable. The pulmonary arteries are normal in caliber. No pulmonary emboli Mediastinum/Nodes: The thyroid and esophagus is normal. No adenopathy. No effusions. Lungs/Pleura: Central airways are normal. No pneumothorax. The ground-glass nodule in the left apex on series 5, image 22 measures 13 x 10 mm today versus 13 x 11 mm previously. No new suspicious nodules. No masses or focal infiltrates. Upper Abdomen: A right renal cyst is again identified. No other abnormalities in the upper  abdomen. Musculoskeletal: No chest wall abnormality. No acute or significant osseous findings. Review of the MIP images confirms the above findings. IMPRESSION: 1. No pulmonary emboli.  No acute abnormalities. 2. Stable ground-glass nodule measuring 13 x 10 mm in the left apex. As recommended on the recent comparison from July 2018, follow-up CT imaging every 2 years until 5 years as is the stability is recommended. 3. Mild atherosclerotic change in the thoracic aorta. Aortic Atherosclerosis (ICD10-I70.0). Electronically Signed   By: Dorise Bullion III M.D   On: 09/19/2017 17:14     Orlie Dakin, MD 09/19/17 [id number]

## 2017-09-19 NOTE — ED Notes (Signed)
Patient transported to CT 

## 2017-09-19 NOTE — Discharge Instructions (Signed)
It is okay to take Tylenol every 4 hours as directed for pain.. You can continue Mucinex as directed. Contact your oncologist( cancer doctor) in 2 days to ask if you should continue your chemotherapy pill. It is okay if you don't take tomorrow's dose. You should have a repeat CAT scan of your chest performed in 2 years. Ask your primary care physician or cancer doctor to order that for you,as there is a small abnormality in the left lung which is unchanged from July 2018. Get your blood pressure recheck within the next 3 weeks. Today's was elevated at 152/91

## 2017-09-21 ENCOUNTER — Telehealth: Payer: Self-pay | Admitting: *Deleted

## 2017-09-21 NOTE — Telephone Encounter (Signed)
09/21/2017 Spoke with patient by phone following notification of ED visit on Saturday 09/19/17. Patient feels like the pain she felt was related to the same pulled muscle that she has had in the past. She states she has a "head cold" with nasal congestion. Notified patient that per Dr. Alvy Bimler, she may continue to take Revlimid as prescribed for this treatment cycle. She reports that once she was told that she did not have pneumonia, she continued to take Revlimid as ordered and has not missed any doses (e.g. 09/20/17 dose was taken). [name] BSN, RN, [place] 09/21/2017 9:48 AM

## 2017-09-22 ENCOUNTER — Other Ambulatory Visit: Payer: Medicare Other

## 2017-09-22 ENCOUNTER — Telehealth: Payer: Self-pay

## 2017-09-22 NOTE — Telephone Encounter (Signed)
I received an prior auth request for ajovy. I have completed and submitted the PA and should have a determination within 48-72 hours.

## 2017-09-23 ENCOUNTER — Ambulatory Visit (HOSPITAL_COMMUNITY): Payer: Medicare Other

## 2017-09-25 ENCOUNTER — Other Ambulatory Visit: Payer: Self-pay | Admitting: Hematology and Oncology

## 2017-09-25 DIAGNOSIS — C9001 Multiple myeloma in remission: Secondary | ICD-10-CM

## 2017-09-25 NOTE — Telephone Encounter (Signed)
Thanks she should still get it for free until 10/2018

## 2017-09-25 NOTE — Telephone Encounter (Signed)
Request Reference Number: [id number]. AJOVY INJ 225/1.5 is denied for not meeting the prior authorization requirement(s). For further questions, call [phone].

## 2017-09-29 ENCOUNTER — Telehealth: Payer: Self-pay | Admitting: *Deleted

## 2017-09-29 ENCOUNTER — Other Ambulatory Visit (HOSPITAL_BASED_OUTPATIENT_CLINIC_OR_DEPARTMENT_OTHER): Payer: Medicare Other

## 2017-09-29 ENCOUNTER — Other Ambulatory Visit: Payer: Medicare Other

## 2017-09-29 ENCOUNTER — Ambulatory Visit (HOSPITAL_COMMUNITY)
Admission: RE | Admit: 2017-09-29 | Discharge: 2017-09-29 | Disposition: A | Discharge status: Home / Self Care | Payer: Medicare Other | Source: Ambulatory Visit | Attending: Hematology and Oncology | Admitting: Hematology and Oncology

## 2017-09-29 ENCOUNTER — Ambulatory Visit: Payer: Medicare Other | Admitting: Hematology and Oncology

## 2017-09-29 DIAGNOSIS — C9001 Multiple myeloma in remission: Secondary | ICD-10-CM

## 2017-09-29 DIAGNOSIS — R937 Abnormal findings on diagnostic imaging of other parts of musculoskeletal system: Secondary | ICD-10-CM | POA: Insufficient documentation

## 2017-09-29 DIAGNOSIS — D61818 Other pancytopenia: Secondary | ICD-10-CM | POA: Diagnosis not present

## 2017-09-29 DIAGNOSIS — R5383 Other fatigue: Secondary | ICD-10-CM

## 2017-09-29 LAB — COMPREHENSIVE METABOLIC PANEL
ALT: 69 U/L — ABNORMAL HIGH (ref 0–55)
AST: 21 U/L (ref 5–34)
Albumin: 3.8 g/dL (ref 3.5–5.0)
Alkaline Phosphatase: 60 U/L (ref 40–150)
Anion Gap: 9 mEq/L (ref 3–11)
BUN: 14 mg/dL (ref 7.0–26.0)
CO2: 29 mEq/L (ref 22–29)
Calcium: 9.6 mg/dL (ref 8.4–10.4)
Chloride: 104 mEq/L (ref 98–109)
Creatinine: 0.9 mg/dL (ref 0.6–1.1)
EGFR: >60 mL/min/{1.73_m2} (ref >60)
Glucose: 87 mg/dl (ref 70–140)
Potassium: 3.6 mEq/L (ref 3.5–5.1)
Sodium: 141 mEq/L (ref 136–145)
Total Bilirubin: 0.46 mg/dL (ref 0.20–1.20)
Total Protein: 8 g/dL (ref 6.4–8.3)

## 2017-09-29 LAB — CBC WITH DIFFERENTIAL/PLATELET
BASO%: 0.3 % (ref 0.0–2.0)
Basophils Absolute: 0 10*3/uL (ref 0.0–0.1)
EOS%: 4.5 % (ref 0.0–7.0)
Eosinophils Absolute: 0.2 10*3/uL (ref 0.0–0.5)
HCT: 39.8 % (ref 34.8–46.6)
HGB: 12.4 g/dL (ref 11.6–15.9)
LYMPH%: 54.1 % — ABNORMAL HIGH (ref 14.0–49.7)
MCH: 22.9 pg — ABNORMAL LOW (ref 25.1–34.0)
MCHC: 31.2 g/dL — ABNORMAL LOW (ref 31.5–36.0)
MCV: 73.5 fL — ABNORMAL LOW (ref 79.5–101.0)
MONO#: 0.5 10*3/uL (ref 0.1–0.9)
MONO%: 14 % (ref 0.0–14.0)
NEUT#: 1 10*3/uL — ABNORMAL LOW (ref 1.5–6.5)
NEUT%: 27.1 % — ABNORMAL LOW (ref 38.4–76.8)
Platelets: 196 10*3/uL (ref 145–400)
RBC: 5.41 10*6/uL (ref 3.70–5.45)
RDW: 14.8 % — ABNORMAL HIGH (ref 11.2–14.5)
WBC: 3.8 10*3/uL — ABNORMAL LOW (ref 3.9–10.3)
lymph#: 2 10*3/uL (ref 0.9–3.3)

## 2017-09-29 LAB — LACTATE DEHYDROGENASE: LDH: 191 U/L (ref 125–245)

## 2017-09-29 LAB — TSH: TSH: 2.54 m(IU)/L (ref 0.308–3.960)

## 2017-09-29 NOTE — Telephone Encounter (Signed)
09/29/2017 Left voice mail message for patient on home phone, noting that blood counts looked good today for treatment next week, however, we will keep the lab appointment scheduled for Monday 10/05/17, in order to collect additional tubes for the research study. Patient can contact me at [phone] if she has any additional questions. [name] BSN, RN, [place] 09/29/2017 8:51 AM

## 2017-09-30 ENCOUNTER — Encounter: Payer: Self-pay | Admitting: Hematology and Oncology

## 2017-09-30 ENCOUNTER — Other Ambulatory Visit: Payer: Self-pay | Admitting: *Deleted

## 2017-09-30 ENCOUNTER — Ambulatory Visit: Payer: Medicare Other | Admitting: Hematology and Oncology

## 2017-09-30 DIAGNOSIS — C9001 Multiple myeloma in remission: Secondary | ICD-10-CM

## 2017-09-30 LAB — KAPPA/LAMBDA LIGHT CHAINS
Ig Kappa Free Light Chain: 24 mg/L — ABNORMAL HIGH (ref 3.3–19.4)
Ig Lambda Free Light Chain: 21.2 mg/L (ref 5.7–26.3)
Kappa/Lambda FluidC Ratio: 1.13 (ref 0.26–1.65)

## 2017-09-30 MED ORDER — LENALIDOMIDE 5 MG PO CAPS: ORAL_CAPSULE | ORAL | 3 refills | Status: DC

## 2017-10-01 LAB — MULTIPLE MYELOMA PANEL, SERUM
Albumin SerPl Elph-Mcnc: 3.5 g/dL (ref 2.9–4.4)
Albumin/Glob SerPl: 1 (ref 0.7–1.7)
Alpha 1: 0.2 g/dL (ref 0.0–0.4)
Alpha2 Glob SerPl Elph-Mcnc: 0.7 g/dL (ref 0.4–1.0)
B-Globulin SerPl Elph-Mcnc: 1.2 g/dL (ref 0.7–1.3)
Gamma Glob SerPl Elph-Mcnc: 1.8 g/dL (ref 0.4–1.8)
Globulin, Total: 3.8 g/dL (ref 2.2–3.9)
IgA, Qn, Serum: 150 mg/dL (ref 87–352)
IgG, Qn, Serum: 1833 mg/dL — ABNORMAL HIGH (ref 700–1600)
IgM, Qn, Serum: 88 mg/dL (ref 26–217)
Total Protein: 7.3 g/dL (ref 6.0–8.5)

## 2017-10-01 NOTE — Telephone Encounter (Signed)
Official denial letter received 09/09/17 for [id number]. Patient must try and fail 2 of the following:  Divalproex sodium regular/delayed/extended release, propranolol regular or XR, timolol, topiramate.

## 2017-10-02 ENCOUNTER — Encounter: Payer: Self-pay | Admitting: *Deleted

## 2017-10-02 ENCOUNTER — Other Ambulatory Visit: Payer: Self-pay | Admitting: *Deleted

## 2017-10-02 DIAGNOSIS — C9001 Multiple myeloma in remission: Secondary | ICD-10-CM

## 2017-10-05 ENCOUNTER — Telehealth: Payer: Self-pay | Admitting: Hematology and Oncology

## 2017-10-05 ENCOUNTER — Ambulatory Visit: Payer: Medicare Other | Admitting: Hematology and Oncology

## 2017-10-05 ENCOUNTER — Encounter: Payer: Self-pay | Admitting: Hematology and Oncology

## 2017-10-05 ENCOUNTER — Encounter: Payer: Medicare Other | Admitting: *Deleted

## 2017-10-05 ENCOUNTER — Other Ambulatory Visit (HOSPITAL_BASED_OUTPATIENT_CLINIC_OR_DEPARTMENT_OTHER): Payer: Medicare Other

## 2017-10-05 DIAGNOSIS — R748 Abnormal levels of other serum enzymes: Secondary | ICD-10-CM

## 2017-10-05 DIAGNOSIS — Z006 Encounter for examination for normal comparison and control in clinical research program: Secondary | ICD-10-CM

## 2017-10-05 DIAGNOSIS — G893 Neoplasm related pain (acute) (chronic): Secondary | ICD-10-CM

## 2017-10-05 DIAGNOSIS — R911 Solitary pulmonary nodule: Secondary | ICD-10-CM

## 2017-10-05 DIAGNOSIS — C9001 Multiple myeloma in remission: Secondary | ICD-10-CM

## 2017-10-05 LAB — CBC WITH DIFFERENTIAL/PLATELET
BASO%: 0.2 % (ref 0.0–2.0)
Basophils Absolute: 0 10*3/uL (ref 0.0–0.1)
EOS%: 1.9 % (ref 0.0–7.0)
Eosinophils Absolute: 0.1 10*3/uL (ref 0.0–0.5)
HCT: 37.3 % (ref 34.8–46.6)
HGB: 11.6 g/dL (ref 11.6–15.9)
LYMPH%: 47.4 % (ref 14.0–49.7)
MCH: 23.2 pg — ABNORMAL LOW (ref 25.1–34.0)
MCHC: 31.1 g/dL — ABNORMAL LOW (ref 31.5–36.0)
MCV: 74.6 fL — ABNORMAL LOW (ref 79.5–101.0)
MONO#: 0.4 10*3/uL (ref 0.1–0.9)
MONO%: 9.3 % (ref 0.0–14.0)
NEUT#: 1.7 10*3/uL (ref 1.5–6.5)
NEUT%: 41.2 % (ref 38.4–76.8)
Platelets: 208 10*3/uL (ref 145–400)
RBC: 5 10*6/uL (ref 3.70–5.45)
RDW: 15.4 % — ABNORMAL HIGH (ref 11.2–14.5)
WBC: 4.2 10*3/uL (ref 3.9–10.3)
lymph#: 2 10*3/uL (ref 0.9–3.3)

## 2017-10-05 LAB — RESEARCH LABS

## 2017-10-05 MED ORDER — OXYCODONE HCL 15 MG PO TABS: 15.0000 mg | ORAL_TABLET | Freq: Three times a day (TID) | ORAL | 0 refills | Status: DC | PRN

## 2017-10-05 NOTE — Telephone Encounter (Signed)
Scheduled appt per 1/15 los - Gave patient AVS and calender per los.  

## 2017-10-05 NOTE — Assessment & Plan Note (Signed)
I reviewed test results with her  So far, her last bone marrow biopsy and recent blood work confirmed complete remission. The patient is comfortable to remain on Revlimid indefinitely. She will continue treatment per research protocol She will continue calcium with vitamin D supplement along with aspirin for DVT prophylaxis. 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 I will see her back as scheduled 

## 2017-10-05 NOTE — Assessment & Plan Note (Signed)
CT scan of the lung reviewed no changes in the lung nodules Per radiologist recommendation, will recheck in 2 years

## 2017-10-05 NOTE — Progress Notes (Signed)
10/05/2017 Patient in to clinic unaccompanied today for evaluation prior to beginning treatment Cycle 37, scheduled for tomorrow. Upon arrival to clinic, patient completed her patient reported outcomes questionnaires independently. Patient then proceeded to lab for collection of research blood samples and local CBC (parameters required for research sample submission, per protocol Appendix I). The physician does not intend to perform a bone marrow biopsy at the end of Cycle 36, since this is not standard institutional practice; however, per correspondence with [facility] lab, blood samples should still be collected and submitted. Based on lab results review from 09/29/2017 collection, 24-hour urine will be collected to confirm response status. Patient to obtain collection jug today and return sample this week; she states she will begin collection tomorrow morning and will return the sample on Wednesday. Patient reports ongoing complaints as noted in AE table and list below. Per MD review and per protocol criteria, findings on bone survey are not felt to represent definite development of new bone lesions. Based on lab results review and history and physical by Dr. Alvy Bimler, patient meets criteria for continued treatment with Revlimid. Patient returned completed cycle 36 Medication Calendar, confirming dosing at dose level -3, Revlimid 34m every other day, Days 1-21. Patient given printed appointment calendar with Cycle 37 treatment days marked, to document doses for the next treatment cycle, which will begin tomorrow. Cindy S. SBrigitte PulseBSN, RN, CCRP 10/05/2017 12:49 PM   Adverse Event Log Study/Protocol: C[facility] ECOG [id number] Maintenance Cycles 34-36: 07/07/2017 - 10/05/2017 (end of Cycle 36 = 10/05/17) Event Grade Attribution to lenalidomide Cycle # Comments  Fatigue  Grade 2  Probable 34-36 Moderate, occasionally limiting ADLs  Sensory neuropathy  Grade 2 Unlikely 34-36 Moderate symptoms, unchanged from previous   Insomnia  Grade 2  Unlikely 34-36 Moderate difficulty, takes sleeping pill regularly  Nausea Grade 1 Possible 34-36 Mild, not interfering with diet  Neutrophil count decreased Grade 2-3  Definite 35-36    Anemia Grade 1 Definite 35    Diarrhea Grade 1 Probable 36 3/day over baseline (Day 15)  Dyspnea Grade 1 Unrelated 36 Shortness of breath noted with activity  Non-reportable AEs - unsolicited, < Grade 3 (grade): - Headaches - moderate (2) - Leukopenia (1) - Moderate pain (2) . Joint pain - wrist, hip, knee . Back pain . Bone pain . Muscle pain . Generalized pain . Abdominal pain - Rash, left forearm (1) - Bruising, arm (1) - Leg cramps (1) - Upper respiratory infection (2) - Elevated glucose (1) - ALT increased (1) Cindy S. SBrigitte PulseBSN, RN, CCRP 10/12/2017 11:59 AM

## 2017-10-05 NOTE — Progress Notes (Signed)
[place] OFFICE PROGRESS NOTE  Patient Care Team: Wenda Low, MD as PCP - General (Internal Medicine) Heath Lark, MD as Consulting Physician (Hematology and Oncology) Benson Norway, RN as Registered Nurse (Oncology)  SUMMARY OF ONCOLOGIC HISTORY: Oncology History   ISS stage 1 IgG lambda subtype (serum albumin 3.6, Beta2 microglobulin 2.32) Durie Salmon Stage 1     Multiple myeloma in remission ([place])   10/10/2013 Imaging    Skeletal survery was negative      11/09/2013 Bone Marrow Biopsy    BM biopsy confirmed myeloma, 76% involved, IgG lambda subtype      12/06/2013 - 08/29/2014 Chemotherapy    Sh received chemo with revlimid, Velcade, Dexamethasone and Zometa. 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      05/05/2014 Imaging    Imaging study of the neck showed no explanation that could cause right neck pain. She is noted to have incidental left upper lung nodule. Plan to repeat imaging study in 3 months.      09/06/2014 Imaging    Bone survey showed no evidence of fracture      09/14/2014 Bone Marrow Biopsy    Bone marrow biopsy showed 8% residual plasma cells by manual count but none on the biopsy specimen      09/26/2014 -  Chemotherapy    She is started on cycle 1 of maintenance Revlimid      01/31/2015 Imaging     chest x-ray showed pneumonia. Treatment was placed on hold.      05/03/2015 Bone Marrow Biopsy    Accession: [id number] repeat bone marrow aspirate and biopsy show 5% residual plasma cells      10/14/2016 Bone Marrow Biopsy    Bone marrow biopsy showed the plasma cells represent 4% of all cells with lack of large aggregates or sheets. To assess the plasma cell clonality, immunohistochemical stains is performed and it lack clonality. Normal  cytogenetics and FISH      01/09/2017 Imaging    CT chest showed ground-glass 1.5 cm apical left upper lobe pulmonary nodule. Initial follow-up with CT at 6-12 months is recommended to confirm persistence. If persistent, repeat CT is recommended every 2 years until 5 years of stability has been established. This recommendation follows the consensus statement: Guidelines for Management of Incidental Pulmonary Nodules Detected on CT Images: From the Fleischner Society 2017; Radiology 2017; 284:228-243. 2. Mild patchy ground-glass opacities in the right upper lobe, probably inflammatory, which can also be reassessed on follow-up chest CT performed for the above dominant ground-glass nodule. 3. Solitary 3 mm right lower lobe solid pulmonary nodule, which can also be reassessed on follow-up chest CT performed for the above dominant ground-glass nodule. 4. No thoracic adenopathy. 5. Aortic atherosclerosis. Two-vessel coronary atherosclerosis.      06/30/2017 Imaging    1. No interval change in the 11 x 13 mm ground-glass nodule left upper lobe. Given the nearly 6 months of imaging stability, repeat CT is recommended every 2 years until 5 years of stability has been established. This recommendation follows the consensus statement: Guidelines for Management of Incidental Pulmonary Nodules Detected on CT Images: From the Fleischner Society 2017; Radiology 2017; 284:228-243. 2. Interval resolution of the tiny patchy ground-glass nodules right upper lobe, likely  infectious/inflammatory etiology. 3. Stable 3 mm right lower lobe pulmonary nodule. Attention on follow-up recommended. 4. Aortic Atherosclerois (ICD10-170.0)      09/29/2017 Imaging    Skeletal survey 1. Questionable new tiny lucency noted the posterior portion of C3 vertebral body. This may represent a small lytic lesion.  2. No definite thoracic lesion noted on today's exam. Stable lucencies in the left ilium and acetabulum.  3. No other focal  abnormalities identified. The left hip is unremarkable .       INTERVAL HISTORY: Please see below for problem oriented charting. The patient returns for further follow-up She denies new bone pain She continues to take oxycodone as needed to control her bone pain She does not want her medication adjusted and only take one oxycodone per day due to sedation side effects She denies recent infection No recent side effects from treatment such as nausea, vomiting or changes in bowel habits  REVIEW OF SYSTEMS:   Constitutional: Denies fevers, chills or abnormal weight loss Eyes: Denies blurriness of vision Ears, nose, mouth, throat, and face: Denies mucositis or sore throat Respiratory: Denies cough, dyspnea or wheezes Cardiovascular: Denies palpitation, chest discomfort or lower extremity swelling Gastrointestinal:  Denies nausea, heartburn or change in bowel habits Skin: Denies abnormal skin rashes Lymphatics: Denies new lymphadenopathy or easy bruising Neurological:Denies numbness, tingling or new weaknesses Behavioral/Psych: Mood is stable, no new changes  All other systems were reviewed with the patient and are negative.  I have reviewed the past medical history, past surgical history, social history and family history with the patient and they are unchanged from previous note.  ALLERGIES:  has No Known Allergies.  MEDICATIONS:  Current Outpatient Medications  Medication Sig Dispense Refill  . aspirin 325 MG tablet Take 1 tablet (325 mg total) by mouth daily.    . Cholecalciferol (VITAMIN D3) 2000 UNITS TABS Take 2,000 Units by mouth daily.    Marland Kitchen Dextromethorphan-Guaifenesin (MUCINEX FAST-MAX DM MAX) 5-100 MG/5ML LIQD Take 10 mLs by mouth daily as needed (cough).    . Fremanezumab-vfrm (AJOVY) 225 MG/1.5ML SOSY Inject 1 pen into the skin every 30 (thirty) days. 1 Syringe 11  . hydrochlorothiazide (HYDRODIURIL) 25 MG tablet Take 25 mg by mouth every evening.     Marland Kitchen levothyroxine  (SYNTHROID, LEVOTHROID) 75 MCG tablet Take 75 mcg by mouth daily before breakfast.    . Multiple Vitamins-Minerals (CENTRUM SILVER PO) Take 1 tablet by mouth daily.     . NONFORMULARY OR COMPOUNDED ITEM Inject 1 application into the skin every 30 (thirty) days. (Patient not taking: Reported on 09/19/2017) 1 each 11  . ONGLYZA 5 MG TABS tablet Take 5 mg by mouth daily.     Marland Kitchen oxyCODONE (ROXICODONE) 15 MG immediate release tablet Take 1 tablet (15 mg total) 3 (three) times daily as needed by mouth for pain. 90 tablet 0  . polyethylene glycol (MIRALAX / GLYCOLAX) packet Take 17 g by mouth daily as needed for mild constipation.     . predniSONE (DELTASONE) 20 MG tablet Take 1 tablet (20 mg total) by mouth daily with breakfast. (Patient not taking: Reported on 09/19/2017) 7 tablet 0  . RESTASIS 0.05 % ophthalmic emulsion PLACE 1 DROP IN BOTH EYES IN THE EVENING  4  . traMADol (ULTRAM) 50 MG tablet Take 1 tablet (50 mg total) by mouth every 6 (six) hours as needed for moderate pain. (Patient not taking: Reported on 09/19/2017) 90 tablet 0  . traZODone (DESYREL) 50 MG tablet TAKE 1 TABLET (  50 MG TOTAL) BY MOUTH AT BEDTIME. 30 tablet 6  . venlafaxine XR (EFFEXOR-XR) 150 MG 24 hr capsule Take 1 capsule (150 mg total) by mouth daily with breakfast. 90 capsule 1  . verapamil (VERELAN PM) 120 MG 24 hr capsule Take 1 capsule by mouth  daily 30 capsule 11  . vitamin E (VITAMIN E) 1000 UNIT capsule Take 400 Units by mouth daily.      No current facility-administered medications for this visit.    Facility-Administered Medications Ordered in Other Visits  Medication Dose Route Frequency Provider Last Rate Last Dose  . gadopentetate dimeglumine (MAGNEVIST) injection 15 mL  15 mL Intravenous Once PRN Melvenia Beam, MD        PHYSICAL EXAMINATION: ECOG PERFORMANCE STATUS: 1 - Symptomatic but completely ambulatory  Vitals:   10/05/17 1207  BP: 131/74  Pulse: 71  Resp: 20  Temp: 98.4 F (36.9 C)  SpO2:  100%   Filed Weights   10/05/17 1207  Weight: 174 lb 12.8 oz (79.3 kg)    GENERAL:alert, no distress and comfortable SKIN: skin color, texture, turgor are normal, no rashes or significant lesions EYES: normal, Conjunctiva are pink and non-injected, sclera clear OROPHARYNX:no exudate, no erythema and lips, buccal mucosa, and tongue normal  NECK: supple, thyroid normal size, non-tender, without nodularity LYMPH:  no palpable lymphadenopathy in the cervical, axillary or inguinal LUNGS: clear to auscultation and percussion with normal breathing effort HEART: regular rate & rhythm and no murmurs and no lower extremity edema ABDOMEN:abdomen soft, non-tender and normal bowel sounds Musculoskeletal:no cyanosis of digits and no clubbing  NEURO: alert & oriented x 3 with fluent speech, no focal motor/sensory deficits  LABORATORY DATA:  I have reviewed the data as listed    Component Value Date/Time   NA 141 09/29/2017 0830   K 3.6 09/29/2017 0830   CL 102 09/19/2017 1556   CO2 29 09/29/2017 0830   GLUCOSE 87 09/29/2017 0830   BUN 14.0 09/29/2017 0830   CREATININE 0.9 09/29/2017 0830   CALCIUM 9.6 09/29/2017 0830   PROT 7.3 09/29/2017 0832   PROT 8.0 09/29/2017 0830   ALBUMIN 3.8 09/29/2017 0830   AST 21 09/29/2017 0830   ALT 69 (H) 09/29/2017 0830   ALKPHOS 60 09/29/2017 0830   BILITOT 0.46 09/29/2017 0830   GFRNONAA >60 09/19/2017 1556   GFRAA >60 09/19/2017 1556    No results found for: SPEP, UPEP  Lab Results  Component Value Date   WBC 4.2 10/05/2017   NEUTROABS 1.7 10/05/2017   HGB 11.6 10/05/2017   HCT 37.3 10/05/2017   MCV 74.6 (L) 10/05/2017   PLT 208 10/05/2017      Chemistry      Component Value Date/Time   NA 141 09/29/2017 0830   K 3.6 09/29/2017 0830   CL 102 09/19/2017 1556   CO2 29 09/29/2017 0830   BUN 14.0 09/29/2017 0830   CREATININE 0.9 09/29/2017 0830      Component Value Date/Time   CALCIUM 9.6 09/29/2017 0830   ALKPHOS 60 09/29/2017 0830    AST 21 09/29/2017 0830   ALT 69 (H) 09/29/2017 0830   BILITOT 0.46 09/29/2017 0830       RADIOGRAPHIC STUDIES: I have personally reviewed the radiological images as listed and agreed with the findings in the report. Dg Chest 2 View  Result Date: 09/19/2017 CLINICAL DATA:  Shortness of breath, upper respiratory symptoms EXAM: CHEST  2 VIEW COMPARISON:  01/02/2017 FINDINGS: The heart size and  mediastinal contours are within normal limits. Both lungs are clear. The visualized skeletal structures are unremarkable. IMPRESSION: No active cardiopulmonary disease. Electronically Signed   By: Jerilynn Mages.  Shick M.D.   On: 09/19/2017 13:48   Ct Angio Chest Pe W And/or Wo Contrast  Result Date: 09/19/2017 CLINICAL DATA:  Shortness of breath. EXAM: CT ANGIOGRAPHY CHEST WITH CONTRAST TECHNIQUE: Multidetector CT imaging of the chest was performed using the standard protocol during bolus administration of intravenous contrast. Multiplanar CT image reconstructions and MIPs were obtained to evaluate the vascular anatomy. CONTRAST:  100 mL of Isovue 370 COMPARISON:  Chest x-ray from earlier today and chest CT June 30, 2017 FINDINGS: Cardiovascular: Minimal atherosclerotic change in the thoracic aorta without aneurysm or dissection. The heart is unremarkable. The pulmonary arteries are normal in caliber. No pulmonary emboli Mediastinum/Nodes: The thyroid and esophagus is normal. No adenopathy. No effusions. Lungs/Pleura: Central airways are normal. No pneumothorax. The ground-glass nodule in the left apex on series 5, image 22 measures 13 x 10 mm today versus 13 x 11 mm previously. No new suspicious nodules. No masses or focal infiltrates. Upper Abdomen: A right renal cyst is again identified. No other abnormalities in the upper abdomen. Musculoskeletal: No chest wall abnormality. No acute or significant osseous findings. Review of the MIP images confirms the above findings. IMPRESSION: 1. No pulmonary emboli.  No acute  abnormalities. 2. Stable ground-glass nodule measuring 13 x 10 mm in the left apex. As recommended on the recent comparison from July 2018, follow-up CT imaging every 2 years until 5 years as is the stability is recommended. 3. Mild atherosclerotic change in the thoracic aorta. Aortic Atherosclerosis (ICD10-I70.0). Electronically Signed   By: Dorise Bullion III M.D   On: 09/19/2017 17:14   Dg Bone Survey Met  Result Date: 09/29/2017 CLINICAL DATA:  New left hip pain. History of multiple myeloma in remission. EXAM: METASTATIC BONE SURVEY COMPARISON:  10/13/2016.  09/28/2015. FINDINGS: Questionable new tiny lucency noted in the posterior portion of the C2 vertebral body. This may represent small lytic lesion. No definite thoracic lesion noted on today's exam. Stable lucencies noted in the left ilium and acetabulum. No other focal bony abnormalities identified. Left hip is unremarkable. IMPRESSION: 1. Questionable new tiny lucency noted the posterior portion of C3 vertebral body. This may represent a small lytic lesion. 2. No definite thoracic lesion noted on today's exam. Stable lucencies in the left ilium and acetabulum. 3. No other focal abnormalities identified. The left hip is unremarkable . Electronically Signed   By: Marcello Moores  Register   On: 09/29/2017 09:26    ASSESSMENT & PLAN:  Multiple myeloma in remission ([place]) I reviewed test results with her  So far, her last bone marrow biopsy and recent blood work confirmed complete remission. The patient is comfortable to remain on Revlimid indefinitely. She will continue treatment per research protocol She will continue calcium with vitamin D supplement along with aspirin for DVT prophylaxis. 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 I will see her back as scheduled  Cancer associated pain  She have chronic bone pain which started with a diagnosis of multiple myeloma. We discussed chronic pain management. MRI did  suggest she had evidence of degenerative disc disease and spinal stenosis. After much discussion, the patient is comfortable to remain on chronic narcotic prescription. I refilled her prescriptions We discussed narcotic refill policy  Elevated liver enzymes She has mildly elevated liver enzymes Could be treatment related  She is not symptomatic Observe only  Lung nodule seen on imaging study CT scan of the lung reviewed no changes in the lung nodules Per radiologist recommendation, will recheck in 2 years   No orders of the defined types were placed in this encounter.  All questions were answered. The patient knows to call the clinic with any problems, questions or concerns. No barriers to learning was detected. I spent 15 minutes counseling the patient face to face. The total time spent in the appointment was 20 minutes and more than 50% was on counseling and review of test results     Heath Lark, MD 10/05/2017 1:58 PM

## 2017-10-05 NOTE — Assessment & Plan Note (Signed)
She have chronic bone pain which started with a diagnosis of multiple myeloma. We discussed chronic pain management. MRI did suggest she had evidence of degenerative disc disease and spinal stenosis. After much discussion, the patient is comfortable to remain on chronic narcotic prescription. I refilled her prescriptions We discussed narcotic refill policy 

## 2017-10-05 NOTE — Assessment & Plan Note (Signed)
She has mildly elevated liver enzymes Could be treatment related She is not symptomatic Observe only

## 2017-10-06 MED ORDER — LENALIDOMIDE 5 MG PO CAPS CTSU E1A11: ORAL_CAPSULE | ORAL | 0 refills | Status: DC

## 2017-10-08 LAB — UPEP/TP, 24-HR URINE
Albumin, U: 100 %
Alpha-1-Globulin, U: 0 %
Alpha-2-Globulin, U: 0 %
Beta Globulin, U: 0 %
Gamma Globulin, U: 0 %

## 2017-10-09 LAB — UIFE/LIGHT CHAINS/TP QN, 24-HR UR
FR KAPPA LT CH,24HR: 90 mg/24 hr
FR LAMBDA LT CH,24HR: 2 mg/24 hr
Free Kappa Lt Chains,Ur: 34.5 mg/L — ABNORMAL HIGH (ref 1.35–24.19)
Free Lambda Lt Chains,Ur: 0.7 mg/L (ref 0.24–6.66)
Kappa/Lambda Ratio,U: 49.29 — ABNORMAL HIGH (ref 2.04–10.37)
PROTEIN,TOTAL,URINE: 7.2 mg/dL
Prot,24hr calculated: 187 mg/24 hr — ABNORMAL HIGH (ref 30–150)

## 2017-10-20 ENCOUNTER — Other Ambulatory Visit: Payer: Self-pay | Admitting: Obstetrics & Gynecology

## 2017-10-20 DIAGNOSIS — Z1231 Encounter for screening mammogram for malignant neoplasm of breast: Secondary | ICD-10-CM

## 2017-10-24 ENCOUNTER — Other Ambulatory Visit: Payer: Self-pay | Admitting: Hematology and Oncology

## 2017-10-24 DIAGNOSIS — C9001 Multiple myeloma in remission: Secondary | ICD-10-CM

## 2017-10-27 ENCOUNTER — Other Ambulatory Visit: Payer: Self-pay | Admitting: Hematology and Oncology

## 2017-10-27 ENCOUNTER — Other Ambulatory Visit: Payer: Self-pay

## 2017-10-27 DIAGNOSIS — C9001 Multiple myeloma in remission: Secondary | ICD-10-CM

## 2017-10-27 MED ORDER — LENALIDOMIDE 5 MG PO CAPS: ORAL_CAPSULE | ORAL | 0 refills | Status: DC

## 2017-10-27 NOTE — Telephone Encounter (Signed)
Electronic refills please

## 2017-10-29 ENCOUNTER — Other Ambulatory Visit: Payer: Self-pay | Admitting: Neurology

## 2017-10-30 ENCOUNTER — Telehealth: Payer: Self-pay | Admitting: Neurology

## 2017-10-30 NOTE — Telephone Encounter (Signed)
Pt request refill for venlafaxine XR (EFFEXOR-XR) 150 MG 24 hr capsule and verapamil (VERELAN PM) 120 MG 24 hr capsule sent to [facility]. Pt is aware the clinic closes at noon today

## 2017-11-02 MED ORDER — VENLAFAXINE HCL ER 150 MG PO CP24: 150.0000 mg | ORAL_CAPSULE | Freq: Every day | ORAL | 3 refills | Status: DC

## 2017-11-02 MED ORDER — VERAPAMIL HCL ER 120 MG PO CP24: ORAL_CAPSULE | ORAL | 11 refills | Status: DC

## 2017-11-02 NOTE — Telephone Encounter (Signed)
Called patient, she state her PCP refilled her medications for awhile but she is needing refills.Effexor and Verapamil refilled.

## 2017-11-03 ENCOUNTER — Encounter: Payer: Self-pay | Admitting: Hematology and Oncology

## 2017-11-03 ENCOUNTER — Encounter: Payer: Medicare Other | Admitting: *Deleted

## 2017-11-03 ENCOUNTER — Other Ambulatory Visit (HOSPITAL_BASED_OUTPATIENT_CLINIC_OR_DEPARTMENT_OTHER): Payer: Medicare Other

## 2017-11-03 DIAGNOSIS — Z006 Encounter for examination for normal comparison and control in clinical research program: Secondary | ICD-10-CM | POA: Diagnosis not present

## 2017-11-03 DIAGNOSIS — C9001 Multiple myeloma in remission: Secondary | ICD-10-CM

## 2017-11-03 LAB — CBC WITH DIFFERENTIAL/PLATELET
BASO%: 0.2 % (ref 0.0–2.0)
Basophils Absolute: 0 10*3/uL (ref 0.0–0.1)
EOS%: 2.9 % (ref 0.0–7.0)
Eosinophils Absolute: 0.1 10*3/uL (ref 0.0–0.5)
HCT: 37.9 % (ref 34.8–46.6)
HGB: 11.6 g/dL (ref 11.6–15.9)
LYMPH%: 56.3 % — ABNORMAL HIGH (ref 14.0–49.7)
MCH: 23.1 pg — ABNORMAL LOW (ref 25.1–34.0)
MCHC: 30.6 g/dL — ABNORMAL LOW (ref 31.5–36.0)
MCV: 75.3 fL — ABNORMAL LOW (ref 79.5–101.0)
MONO#: 0.4 10*3/uL (ref 0.1–0.9)
MONO%: 8.7 % (ref 0.0–14.0)
NEUT#: 1.3 10*3/uL — ABNORMAL LOW (ref 1.5–6.5)
NEUT%: 31.9 % — ABNORMAL LOW (ref 38.4–76.8)
Platelets: 208 10*3/uL (ref 145–400)
RBC: 5.03 10*6/uL (ref 3.70–5.45)
RDW: 15.3 % — ABNORMAL HIGH (ref 11.2–14.5)
WBC: 4.2 10*3/uL (ref 3.9–10.3)
lymph#: 2.3 10*3/uL (ref 0.9–3.3)

## 2017-11-03 MED ORDER — LENALIDOMIDE 5 MG PO CAPS CTSU E1A11: ORAL_CAPSULE | ORAL | 0 refills | Status: DC

## 2017-11-03 NOTE — Progress Notes (Signed)
11/03/2017 Patient in to clinic today for hematology tests prior to beginning maintenance treatment Cycle 38. Patient reports ongoing symptoms as noted in table below, as well as cold symptoms that lasted for eight days. She denies any symptoms of nausea, vomiting, diarrhea, shortness of breath, swelling in arms or legs, fever, irritability, muscle weakness, peripheral motor neuropathy or acneiform rash. Patient returned completed cycle 37 Medication Calendar, confirming dosing at dose level -3, Revlimid 5mg  every other day, Days 1-21. Patient was given printed appointment calendar with Cycle 38 treatment days marked. Patient instructed not to begin dosing with Revlimid until she has been contacted about the results of today's CBC. Patient verbalized understanding and stated to contact her via cell phone. [name] BSN, RN, [place] 11/03/2017 8:15 AM  11/03/2017 Contacted patient by phone regarding CBC results acceptable for continued treatment. Patient will begin dosing as instructed today, with return visit on 11/30/17 prior to the next treatment cycle. Of note, cycle 39 will be expected to start on 12/01/17, however, due to cancer center holiday closure that day, patient will come to clinic one day early for labwork. [name] BSN, RN, [place] 11/03/2017 8:33 AM  Adverse Event Log Study/Protocol: [facility] ECOG E1A11 Maintenance Cycle 37: 10/06/2017 - 11/02/2017 Event Grade Comments  Fatigue  Grade 2  Moderate, occasionally limiting ADLs  Sensory neuropathy  Grade 2 Moderate symptoms, unchanged from previous  Insomnia  Grade 2  Moderate difficulty, takes sleeping pill regularly.  Neutrophil count decreased Grade 2     Non-reportable AEs (grade): Moderate pain (2)  Back pain  Bone pain  Hip pain  Knee pain Upper respiratory infection (2) [name] BSN, RN, CCRP 11/03/2017 10:35 AM

## 2017-11-11 ENCOUNTER — Ambulatory Visit
Admission: RE | Admit: 2017-11-11 | Discharge: 2017-11-11 | Disposition: A | Discharge status: Home / Self Care | Payer: Medicare Other | Source: Ambulatory Visit | Attending: Obstetrics & Gynecology | Admitting: Obstetrics & Gynecology

## 2017-11-11 DIAGNOSIS — Z1231 Encounter for screening mammogram for malignant neoplasm of breast: Secondary | ICD-10-CM

## 2017-11-16 ENCOUNTER — Other Ambulatory Visit: Payer: Self-pay | Admitting: Hematology and Oncology

## 2017-11-16 DIAGNOSIS — C9001 Multiple myeloma in remission: Secondary | ICD-10-CM

## 2017-11-16 NOTE — Telephone Encounter (Signed)
PLs refill electronically

## 2017-11-19 ENCOUNTER — Other Ambulatory Visit: Payer: Self-pay | Admitting: *Deleted

## 2017-11-30 ENCOUNTER — Encounter: Payer: Medicare Other | Admitting: *Deleted

## 2017-11-30 ENCOUNTER — Other Ambulatory Visit (HOSPITAL_BASED_OUTPATIENT_CLINIC_OR_DEPARTMENT_OTHER): Payer: Medicare Other

## 2017-11-30 DIAGNOSIS — C9001 Multiple myeloma in remission: Secondary | ICD-10-CM | POA: Diagnosis not present

## 2017-11-30 DIAGNOSIS — Z006 Encounter for examination for normal comparison and control in clinical research program: Secondary | ICD-10-CM

## 2017-11-30 LAB — CBC WITH DIFFERENTIAL/PLATELET
BASO%: 0.5 % (ref 0.0–2.0)
Basophils Absolute: 0 10*3/uL (ref 0.0–0.1)
EOS%: 2.3 % (ref 0.0–7.0)
Eosinophils Absolute: 0.1 10*3/uL (ref 0.0–0.5)
HCT: 37 % (ref 34.8–46.6)
HGB: 11.5 g/dL — ABNORMAL LOW (ref 11.6–15.9)
LYMPH%: 60.9 % — ABNORMAL HIGH (ref 14.0–49.7)
MCH: 23 pg — ABNORMAL LOW (ref 25.1–34.0)
MCHC: 31.2 g/dL — ABNORMAL LOW (ref 31.5–36.0)
MCV: 73.7 fL — ABNORMAL LOW (ref 79.5–101.0)
MONO#: 0.4 10*3/uL (ref 0.1–0.9)
MONO%: 11.4 % (ref 0.0–14.0)
NEUT#: 0.9 10*3/uL — ABNORMAL LOW (ref 1.5–6.5)
NEUT%: 24.9 % — ABNORMAL LOW (ref 38.4–76.8)
Platelets: 186 10*3/uL (ref 145–400)
RBC: 5.01 10*6/uL (ref 3.70–5.45)
RDW: 15.1 % — ABNORMAL HIGH (ref 11.2–14.5)
WBC: 3.6 10*3/uL — ABNORMAL LOW (ref 3.9–10.3)
lymph#: 2.2 10*3/uL (ref 0.9–3.3)

## 2017-11-30 NOTE — Progress Notes (Signed)
11/30/2017 Patient in to clinic this morning for evaluation prior to beginning maintenance treatment cycle 39. Patient returned completed cycle 38Medication Calendar, confirming correct dosing as prescribed(dose level -3, Revlimid 5mg  every other day, Days 1-21). Patient's ANC was 900, and therefore not high enough to begin treatment today. This was discussed with Dr. Rulon Eisenmenger, PI, in Dr. Calton Dach absence. Patient was notified to hold Revlimid, and she will return to clinic next week on 1/8/2019for repeat CBC. Patient verbalized understanding. Schedule change request placed to add lab appointment on 12/08/2017, and to move late January appointments accordingly, due to treatment delay. Patient verbalized understanding of plan. Patient reports the following symptoms that occurred during the course of Cycle 38:  Adverse Event Log Study/Protocol: [facility] ECOG E1A11 Maintenance Cycle 38: 11/03/2017 - 11/30/2017 Event Grade Comments  Fatigue  Grade 2  Moderate, occasionally limiting ADLs  Sensory neuropathy  Grade 2 Moderate symptoms, unchanged from previous  Insomnia  Grade 2  Moderate difficulty, takes sleeping pill regularly.  Neutrophil count decreased Grade 3     Nausea Grade 1 x 1 day (11/28/17), minimal effect on oral intake  Diarrhea Grade 1 x 1 day (11/05/17) 4-5 max # stools in 24 hr  Anemia Grade 1    Numbness back of legs Grade 2 x 1 day (11/14/17) upon awakening   Non-reportable AEs (grade): - Moderate pain (2)  Back pain  Bone pain  Hip pain - Leukopenia (1) [name] BSN, RN, CCRP 11/30/2017 1:25 PM

## 2017-12-02 ENCOUNTER — Telehealth: Payer: Self-pay | Admitting: Hematology and Oncology

## 2017-12-02 NOTE — Telephone Encounter (Signed)
Scheduled appt per 12/31 sch message - Pt is aware of appt date and times

## 2017-12-08 ENCOUNTER — Inpatient Hospital Stay: Payer: Medicare Other | Admitting: *Deleted

## 2017-12-08 ENCOUNTER — Inpatient Hospital Stay: Payer: Medicare Other | Attending: Hematology and Oncology

## 2017-12-08 ENCOUNTER — Encounter: Payer: Self-pay | Admitting: Hematology and Oncology

## 2017-12-08 DIAGNOSIS — Z006 Encounter for examination for normal comparison and control in clinical research program: Secondary | ICD-10-CM | POA: Diagnosis present

## 2017-12-08 DIAGNOSIS — D701 Agranulocytosis secondary to cancer chemotherapy: Secondary | ICD-10-CM | POA: Diagnosis not present

## 2017-12-08 DIAGNOSIS — C9001 Multiple myeloma in remission: Secondary | ICD-10-CM | POA: Insufficient documentation

## 2017-12-08 DIAGNOSIS — Z79899 Other long term (current) drug therapy: Secondary | ICD-10-CM | POA: Insufficient documentation

## 2017-12-08 LAB — CBC WITH DIFFERENTIAL/PLATELET
Abs Granulocyte: 1 10*3/uL — ABNORMAL LOW (ref 1.5–6.5)
Basophils Absolute: 0 10*3/uL (ref 0.0–0.1)
Basophils Relative: 1 %
Eosinophils Absolute: 0.1 10*3/uL (ref 0.0–0.5)
Eosinophils Relative: 3 %
HCT: 39 % (ref 34.8–46.6)
Hemoglobin: 12.2 g/dL (ref 11.6–15.9)
Lymphocytes Relative: 53 %
Lymphs Abs: 1.9 10*3/uL (ref 0.9–3.3)
MCH: 23.4 pg — ABNORMAL LOW (ref 25.1–34.0)
MCHC: 31.3 g/dL — ABNORMAL LOW (ref 31.5–36.0)
MCV: 74.9 fL — ABNORMAL LOW (ref 79.5–101.0)
Monocytes Absolute: 0.6 10*3/uL (ref 0.1–0.9)
Monocytes Relative: 15 %
Neutro Abs: 1 10*3/uL — ABNORMAL LOW (ref 1.5–6.5)
Neutrophils Relative %: 28 %
Platelets: 210 10*3/uL (ref 145–400)
RBC: 5.21 MIL/uL (ref 3.70–5.45)
RDW: 15.4 % (ref 11.2–16.1)
WBC: 3.6 10*3/uL — ABNORMAL LOW (ref 3.9–10.3)

## 2017-12-08 MED ORDER — LENALIDOMIDE 5 MG PO CAPS CTSU E1A11: ORAL_CAPSULE | ORAL | 0 refills | Status: DC

## 2017-12-08 NOTE — Progress Notes (Signed)
12/08/2017 Patient in to clinic today for re-evaluation prior to beginning maintenance treatment Cycle 39, following one week delay due to grade 3 neutropenia on Cycle 38, Day 28 (note that [place] was closed on Day 29, projected Cycle 39, Day 1). Patient given an updated appointment calendar with Cycle 39treatment days marked, to document doses for the next treatment cycle, which will begin today only if counts are acceptable for continued treatment. Patient is aware that she should await instruction from the research nurse before proceeding with dosing today. Patient denies any new symptoms over the past week, and notes that her husband, Arnell Sieving, is hospitalized following his second knee replacement yesterday. Spoke with patient by phone at 8:19am to inform her that [place] = 1000 today and that results of today's CBC are acceptable to begin next cycle of treatment with lenalidomide. Patient voiced understanding. Confirmed next lab appointment in three weeks, for collection of myeloma panel as well as hematology and chemistry tests, prior to expected cycle 40 on 01/05/2018. Patient is aware that if counts are too low on 1/29 for the next treatment cycle, that a CBC will be added on 01/05/18 to re-evaluate. [name] BSN, RN, CCRP 12/08/2017 8:40 AM

## 2017-12-18 ENCOUNTER — Other Ambulatory Visit: Payer: Self-pay | Admitting: Hematology and Oncology

## 2017-12-18 DIAGNOSIS — C9001 Multiple myeloma in remission: Secondary | ICD-10-CM

## 2017-12-21 ENCOUNTER — Other Ambulatory Visit: Payer: Self-pay | Admitting: *Deleted

## 2017-12-21 DIAGNOSIS — C9001 Multiple myeloma in remission: Secondary | ICD-10-CM

## 2017-12-21 MED ORDER — LENALIDOMIDE 5 MG PO CAPS: ORAL_CAPSULE | ORAL | 0 refills | Status: DC

## 2017-12-22 ENCOUNTER — Other Ambulatory Visit: Payer: Medicare Other

## 2017-12-25 ENCOUNTER — Other Ambulatory Visit: Payer: Self-pay | Admitting: *Deleted

## 2017-12-25 DIAGNOSIS — C9001 Multiple myeloma in remission: Secondary | ICD-10-CM

## 2017-12-29 ENCOUNTER — Ambulatory Visit: Payer: Medicare Other | Admitting: Hematology and Oncology

## 2017-12-29 ENCOUNTER — Inpatient Hospital Stay: Payer: Medicare Other

## 2017-12-29 DIAGNOSIS — C9001 Multiple myeloma in remission: Secondary | ICD-10-CM

## 2017-12-29 LAB — CMP (CANCER CENTER ONLY)
ALT: 39 U/L (ref 0–55)
AST: 27 U/L (ref 5–34)
Albumin: 3.7 g/dL (ref 3.5–5.0)
Alkaline Phosphatase: 53 U/L (ref 40–150)
Anion gap: 7 (ref 3–11)
BUN: 13 mg/dL (ref 7–26)
CO2: 27 mmol/L (ref 22–29)
Calcium: 9 mg/dL (ref 8.4–10.4)
Chloride: 105 mmol/L (ref 98–109)
Creatinine: 0.89 mg/dL (ref 0.60–1.10)
GFR, Est AFR Am: >60 mL/min (ref >60)
GFR, Estimated: >60 mL/min (ref >60)
Glucose, Bld: 83 mg/dL (ref 70–140)
Potassium: 3.7 mmol/L (ref 3.3–4.7)
Sodium: 139 mmol/L (ref 136–145)
Total Bilirubin: 0.5 mg/dL (ref 0.2–1.2)
Total Protein: 7.5 g/dL (ref 6.4–8.3)

## 2017-12-29 LAB — CBC WITH DIFFERENTIAL (CANCER CENTER ONLY)
Basophils Absolute: 0 10*3/uL (ref 0.0–0.1)
Basophils Relative: 0 %
Eosinophils Absolute: 0.1 10*3/uL (ref 0.0–0.5)
Eosinophils Relative: 3 %
HCT: 38.5 % (ref 34.8–46.6)
Hemoglobin: 12 g/dL (ref 11.6–15.9)
Lymphocytes Relative: 51 %
Lymphs Abs: 1.6 10*3/uL (ref 0.9–3.3)
MCH: 23.2 pg — ABNORMAL LOW (ref 25.1–34.0)
MCHC: 31.2 g/dL — ABNORMAL LOW (ref 31.5–36.0)
MCV: 74.3 fL — ABNORMAL LOW (ref 79.5–101.0)
Monocytes Absolute: 0.4 10*3/uL (ref 0.1–0.9)
Monocytes Relative: 13 %
Neutro Abs: 1 10*3/uL — ABNORMAL LOW (ref 1.5–6.5)
Neutrophils Relative %: 33 %
Platelet Count: 167 10*3/uL (ref 145–400)
RBC: 5.18 MIL/uL (ref 3.70–5.45)
RDW: 15.1 % (ref 11.2–16.1)
WBC Count: 3.1 10*3/uL — ABNORMAL LOW (ref 3.9–10.3)

## 2017-12-29 LAB — LACTATE DEHYDROGENASE: LDH: 177 U/L (ref 125–245)

## 2017-12-29 LAB — TSH: TSH: 1.136 u[IU]/mL (ref 0.308–3.960)

## 2017-12-30 LAB — KAPPA/LAMBDA LIGHT CHAINS
Kappa free light chain: 23.3 mg/L — ABNORMAL HIGH (ref 3.3–19.4)
Kappa, lambda light chain ratio: 1.05 (ref 0.26–1.65)
Lambda free light chains: 22.1 mg/L (ref 5.7–26.3)

## 2017-12-31 LAB — MULTIPLE MYELOMA PANEL, SERUM
Albumin SerPl Elph-Mcnc: 3.4 g/dL (ref 2.9–4.4)
Albumin/Glob SerPl: 1 (ref 0.7–1.7)
Alpha 1: 0.2 g/dL (ref 0.0–0.4)
Alpha2 Glob SerPl Elph-Mcnc: 0.5 g/dL (ref 0.4–1.0)
B-Globulin SerPl Elph-Mcnc: 1.1 g/dL (ref 0.7–1.3)
Gamma Glob SerPl Elph-Mcnc: 1.8 g/dL (ref 0.4–1.8)
Globulin, Total: 3.6 g/dL (ref 2.2–3.9)
IgA: 146 mg/dL (ref 87–352)
IgG (Immunoglobin G), Serum: 1959 mg/dL — ABNORMAL HIGH (ref 700–1600)
IgM (Immunoglobulin M), Srm: 73 mg/dL (ref 26–217)
Total Protein ELP: 7 g/dL (ref 6.0–8.5)

## 2018-01-01 ENCOUNTER — Encounter: Payer: Self-pay | Admitting: *Deleted

## 2018-01-05 ENCOUNTER — Encounter: Payer: Self-pay | Admitting: Hematology and Oncology

## 2018-01-05 ENCOUNTER — Telehealth: Payer: Self-pay | Admitting: Hematology and Oncology

## 2018-01-05 ENCOUNTER — Inpatient Hospital Stay: Payer: Medicare Other | Attending: Hematology and Oncology | Admitting: Hematology and Oncology

## 2018-01-05 ENCOUNTER — Inpatient Hospital Stay: Payer: Medicare Other | Admitting: *Deleted

## 2018-01-05 DIAGNOSIS — G893 Neoplasm related pain (acute) (chronic): Secondary | ICD-10-CM | POA: Insufficient documentation

## 2018-01-05 DIAGNOSIS — Z9221 Personal history of antineoplastic chemotherapy: Secondary | ICD-10-CM | POA: Diagnosis not present

## 2018-01-05 DIAGNOSIS — Z79899 Other long term (current) drug therapy: Secondary | ICD-10-CM | POA: Insufficient documentation

## 2018-01-05 DIAGNOSIS — K1370 Unspecified lesions of oral mucosa: Secondary | ICD-10-CM | POA: Insufficient documentation

## 2018-01-05 DIAGNOSIS — L989 Disorder of the skin and subcutaneous tissue, unspecified: Secondary | ICD-10-CM

## 2018-01-05 DIAGNOSIS — Z006 Encounter for examination for normal comparison and control in clinical research program: Secondary | ICD-10-CM | POA: Diagnosis not present

## 2018-01-05 DIAGNOSIS — C9001 Multiple myeloma in remission: Secondary | ICD-10-CM | POA: Insufficient documentation

## 2018-01-05 DIAGNOSIS — D61811 Other drug-induced pancytopenia: Secondary | ICD-10-CM

## 2018-01-05 MED ORDER — LENALIDOMIDE 5 MG PO CAPS CTSU E1A11: ORAL_CAPSULE | ORAL | 0 refills | Status: DC

## 2018-01-05 MED ORDER — OXYCODONE HCL 15 MG PO TABS: 15.0000 mg | ORAL_TABLET | Freq: Three times a day (TID) | ORAL | 0 refills | Status: DC | PRN

## 2018-01-05 NOTE — Telephone Encounter (Signed)
Gave patient avs and calendar with appts per 2/5 los °

## 2018-01-06 ENCOUNTER — Encounter: Payer: Self-pay | Admitting: Hematology and Oncology

## 2018-01-06 ENCOUNTER — Telehealth: Payer: Self-pay | Admitting: Hematology and Oncology

## 2018-01-06 DIAGNOSIS — L989 Disorder of the skin and subcutaneous tissue, unspecified: Secondary | ICD-10-CM | POA: Insufficient documentation

## 2018-01-06 NOTE — Assessment & Plan Note (Signed)
I reviewed test results with her  So far, her last bone marrow biopsy and recent blood work confirmed complete remission. The patient is comfortable to remain on Revlimid indefinitely. She will continue treatment per research protocol She will continue calcium with vitamin D supplement along with aspirin for DVT prophylaxis. 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 I will see her back as scheduled 

## 2018-01-06 NOTE — Assessment & Plan Note (Signed)
This is likely due to recent treatment. The patient denies recent history of fevers, cough, chills, diarrhea or dysuria. She is asymptomatic from the leukopenia. I will observe for now.  I will continue the chemotherapy at current dose without dosage adjustment.  If the leukopenia gets progressive worse in the future, I might have to delay her treatment or adjust the chemotherapy dose per protocol

## 2018-01-06 NOTE — Assessment & Plan Note (Signed)
She has a benign mucosal lesion inside her left cheek. The patient is reassured I do not believe she needs biopsy or further investigation.

## 2018-01-06 NOTE — Assessment & Plan Note (Signed)
She have chronic bone pain which started with a diagnosis of multiple myeloma. We discussed chronic pain management. MRI did suggest she had evidence of degenerative disc disease and spinal stenosis. After much discussion, the patient is comfortable to remain on chronic narcotic prescription. I refilled her prescriptions We discussed narcotic refill policy 

## 2018-01-06 NOTE — Telephone Encounter (Signed)
Mailed patient calendar of upcoming March and April appointments per 2/5 sch message

## 2018-01-06 NOTE — Progress Notes (Signed)
[facility] OFFICE PROGRESS NOTE  Patient Care Team: Wenda Low, MD as PCP - General (Internal Medicine) Heath Lark, MD as Consulting Physician (Hematology and Oncology) Benson Norway, RN as Registered Nurse (Oncology)  SUMMARY OF ONCOLOGIC HISTORY: Oncology History   ISS stage 1 IgG lambda subtype (serum albumin 3.6, Beta2 microglobulin 2.32) Durie Salmon Stage 1     Multiple myeloma in remission ([place])   10/10/2013 Imaging    Skeletal survery was negative      11/09/2013 Bone Marrow Biopsy    BM biopsy confirmed myeloma, 76% involved, IgG lambda subtype      12/06/2013 - 08/29/2014 Chemotherapy    Sh received chemo with revlimid, Velcade, Dexamethasone and Zometa. 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      05/05/2014 Imaging    Imaging study of the neck showed no explanation that could cause right neck pain. She is noted to have incidental left upper lung nodule. Plan to repeat imaging study in 3 months.      09/06/2014 Imaging    Bone survey showed no evidence of fracture      09/14/2014 Bone Marrow Biopsy    Bone marrow biopsy showed 8% residual plasma cells by manual count but none on the biopsy specimen      09/26/2014 -  Chemotherapy    She is started on cycle 1 of maintenance Revlimid      01/31/2015 Imaging     chest x-ray showed pneumonia. Treatment was placed on hold.      05/03/2015 Bone Marrow Biopsy    Accession: [id number] repeat bone marrow aspirate and biopsy show 5% residual plasma cells      10/14/2016 Bone Marrow Biopsy    Bone marrow biopsy showed the plasma cells represent 4% of all cells with lack of large aggregates or sheets. To assess the plasma cell clonality, immunohistochemical stains is performed and it lack clonality. Normal  cytogenetics and FISH      01/09/2017 Imaging    CT chest showed ground-glass 1.5 cm apical left upper lobe pulmonary nodule. Initial follow-up with CT at 6-12 months is recommended to confirm persistence. If persistent, repeat CT is recommended every 2 years until 5 years of stability has been established. This recommendation follows the consensus statement: Guidelines for Management of Incidental Pulmonary Nodules Detected on CT Images: From the Fleischner Society 2017; Radiology 2017; 284:228-243. 2. Mild patchy ground-glass opacities in the right upper lobe, probably inflammatory, which can also be reassessed on follow-up chest CT performed for the above dominant ground-glass nodule. 3. Solitary 3 mm right lower lobe solid pulmonary nodule, which can also be reassessed on follow-up chest CT performed for the above dominant ground-glass nodule. 4. No thoracic adenopathy. 5. Aortic atherosclerosis. Two-vessel coronary atherosclerosis.      06/30/2017 Imaging    1. No interval change in the 11 x 13 mm ground-glass nodule left upper lobe. Given the nearly 6 months of imaging stability, repeat CT is recommended every 2 years until 5 years of stability has been established. This recommendation follows the consensus statement: Guidelines for Management of Incidental Pulmonary Nodules Detected on CT Images: From the Fleischner Society 2017; Radiology 2017; 284:228-243. 2. Interval resolution of the tiny patchy ground-glass nodules right upper lobe, likely  infectious/inflammatory etiology. 3. Stable 3 mm right lower lobe pulmonary nodule. Attention on follow-up recommended. 4. Aortic Atherosclerois (ICD10-170.0)      09/29/2017 Imaging    Skeletal survey 1. Questionable new tiny lucency noted the posterior portion of C3 vertebral body. This may represent a small lytic lesion.  2. No definite thoracic lesion noted on today's exam. Stable lucencies in the left ilium and acetabulum.  3. No other focal  abnormalities identified. The left hip is unremarkable .       INTERVAL HISTORY: Please see below for problem oriented charting. She returns for further follow-up She complained of her discomfort inside the left cheek for the last 3 weeks She denies oral bleeding No recent infection Her bone pain is stable.  REVIEW OF SYSTEMS:   Constitutional: Denies fevers, chills or abnormal weight loss Eyes: Denies blurriness of vision Ears, nose, mouth, throat, and face: Denies mucositis or sore throat Respiratory: Denies cough, dyspnea or wheezes Cardiovascular: Denies palpitation, chest discomfort or lower extremity swelling Gastrointestinal:  Denies nausea, heartburn or change in bowel habits Skin: Denies abnormal skin rashes Lymphatics: Denies new lymphadenopathy or easy bruising Neurological:Denies numbness, tingling or new weaknesses Behavioral/Psych: Mood is stable, no new changes  All other systems were reviewed with the patient and are negative.  I have reviewed the past medical history, past surgical history, social history and family history with the patient and they are unchanged from previous note.  ALLERGIES:  has No Known Allergies.  MEDICATIONS:  Current Outpatient Medications  Medication Sig Dispense Refill  . aspirin 325 MG tablet Take 1 tablet (325 mg total) by mouth daily.    . Cholecalciferol (VITAMIN D3) 2000 UNITS TABS Take 2,000 Units by mouth daily.    . hydrochlorothiazide (HYDRODIURIL) 25 MG tablet Take 25 mg by mouth every evening.     Marland Kitchen lenalidomide (REVLIMID) 5 MG capsule Take 1 capsule every other day for 21 days, off 7 days. Repeat every 28 days. 11 capsule 0  . levothyroxine (SYNTHROID, LEVOTHROID) 75 MCG tablet Take 75 mcg by mouth daily before breakfast.    . Multiple Vitamins-Minerals (CENTRUM SILVER PO) Take 1 tablet by mouth daily.     . ONGLYZA 5 MG TABS tablet Take 5 mg by mouth daily.     Marland Kitchen oxyCODONE (ROXICODONE) 15 MG immediate release tablet  Take 1 tablet (15 mg total) by mouth 3 (three) times daily as needed for pain. 90 tablet 0  . polyethylene glycol (MIRALAX / GLYCOLAX) packet Take 17 g by mouth daily as needed for mild constipation.     . RESTASIS 0.05 % ophthalmic emulsion PLACE 1 DROP IN BOTH EYES IN THE EVENING  4  . traMADol (ULTRAM) 50 MG tablet Take 1 tablet (50 mg total) by mouth every 6 (six) hours as needed for moderate pain. (Patient not taking: Reported on 09/19/2017) 90 tablet 0  . traZODone (DESYREL) 50 MG tablet TAKE 1 TABLET (50 MG TOTAL) BY MOUTH AT BEDTIME. 30 tablet 6  . venlafaxine XR (EFFEXOR-XR) 150 MG 24 hr capsule Take 1 capsule (150 mg total) by mouth daily with breakfast. 90 capsule 3  . verapamil (VERELAN PM) 120 MG 24 hr capsule Take 1 capsule by mouth  daily 30 capsule 11  . vitamin E (VITAMIN E) 1000 UNIT capsule Take 400 Units by mouth daily.      No current facility-administered medications for this visit.    Facility-Administered Medications Ordered in Other Visits  Medication Dose Route Frequency Provider Last  Rate Last Dose  . gadopentetate dimeglumine (MAGNEVIST) injection 15 mL  15 mL Intravenous Once PRN Melvenia Beam, MD        PHYSICAL EXAMINATION: ECOG PERFORMANCE STATUS: 1 - Symptomatic but completely ambulatory  Vitals:   01/05/18 0811  BP: 126/74  Pulse: 78  Resp: 18  Temp: 98.2 F (36.8 C)  SpO2: 100%   Filed Weights   01/05/18 0811  Weight: 176 lb 12.8 oz (80.2 kg)    GENERAL:alert, no distress and comfortable SKIN: skin color, texture, turgor are normal, no rashes or significant lesions EYES: normal, Conjunctiva are pink and non-injected, sclera clear OROPHARYNX:no exudate, no erythema and lips, buccal mucosa, and tongue normal  NECK: supple, thyroid normal size, non-tender, without nodularity LYMPH:  no palpable lymphadenopathy in the cervical, axillary or inguinal LUNGS: clear to auscultation and percussion with normal breathing effort HEART: regular rate &  rhythm and no murmurs and no lower extremity edema ABDOMEN:abdomen soft, non-tender and normal bowel sounds Musculoskeletal:no cyanosis of digits and no clubbing  NEURO: alert & oriented x 3 with fluent speech, no focal motor/sensory deficits  LABORATORY DATA:  I have reviewed the data as listed    Component Value Date/Time   NA 139 12/29/2017 0744   NA 141 09/29/2017 0830   K 3.7 12/29/2017 0744   K 3.6 09/29/2017 0830   CL 105 12/29/2017 0744   CO2 27 12/29/2017 0744   CO2 29 09/29/2017 0830   GLUCOSE 83 12/29/2017 0744   GLUCOSE 87 09/29/2017 0830   BUN 13 12/29/2017 0744   BUN 14.0 09/29/2017 0830   CREATININE 0.89 12/29/2017 0744   CREATININE 0.9 09/29/2017 0830   CALCIUM 9.0 12/29/2017 0744   CALCIUM 9.6 09/29/2017 0830   PROT 7.5 12/29/2017 0744   PROT 7.3 09/29/2017 0832   PROT 8.0 09/29/2017 0830   ALBUMIN 3.7 12/29/2017 0744   ALBUMIN 3.8 09/29/2017 0830   AST 27 12/29/2017 0744   AST 21 09/29/2017 0830   ALT 39 12/29/2017 0744   ALT 69 (H) 09/29/2017 0830   ALKPHOS 53 12/29/2017 0744   ALKPHOS 60 09/29/2017 0830   BILITOT 0.5 12/29/2017 0744   BILITOT 0.46 09/29/2017 0830   GFRNONAA >60 12/29/2017 0744   GFRAA >60 12/29/2017 0744    No results found for: SPEP, UPEP  Lab Results  Component Value Date   WBC 3.1 (L) 12/29/2017   NEUTROABS 1.0 (L) 12/29/2017   HGB 12.2 12/08/2017   HCT 38.5 12/29/2017   MCV 74.3 (L) 12/29/2017   PLT 167 12/29/2017      Chemistry      Component Value Date/Time   NA 139 12/29/2017 0744   NA 141 09/29/2017 0830   K 3.7 12/29/2017 0744   K 3.6 09/29/2017 0830   CL 105 12/29/2017 0744   CO2 27 12/29/2017 0744   CO2 29 09/29/2017 0830   BUN 13 12/29/2017 0744   BUN 14.0 09/29/2017 0830   CREATININE 0.89 12/29/2017 0744   CREATININE 0.9 09/29/2017 0830      Component Value Date/Time   CALCIUM 9.0 12/29/2017 0744   CALCIUM 9.6 09/29/2017 0830   ALKPHOS 53 12/29/2017 0744   ALKPHOS 60 09/29/2017 0830   AST 27  12/29/2017 0744   AST 21 09/29/2017 0830   ALT 39 12/29/2017 0744   ALT 69 (H) 09/29/2017 0830   BILITOT 0.5 12/29/2017 0744   BILITOT 0.46 09/29/2017 0830       ASSESSMENT & PLAN:  Multiple myeloma  in remission [facility]) I reviewed test results with her  So far, her last bone marrow biopsy and recent blood work confirmed complete remission. The patient is comfortable to remain on Revlimid indefinitely. She will continue treatment per research protocol She will continue calcium with vitamin D supplement along with aspirin for DVT prophylaxis. 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 I will see her back as scheduled  Benign skin lesion of cheek She has a benign mucosal lesion inside her left cheek. The patient is reassured I do not believe she needs biopsy or further investigation.  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 We discussed chronic pain management. MRI did suggest she had evidence of degenerative disc disease and spinal stenosis. After much discussion, the patient is comfortable to remain on chronic narcotic prescription. I refilled her prescriptions We discussed narcotic refill policy  Drug-induced pancytopenia ([place]) This is likely due to recent treatment. The patient denies recent history of fevers, cough, chills, diarrhea or dysuria. She is asymptomatic from the leukopenia. I will observe for now.  I will continue the chemotherapy at current dose without dosage adjustment.  If the leukopenia gets progressive worse in the future, I might have to delay her treatment or adjust the chemotherapy dose per protocol    No orders of the defined types were placed in this encounter.  All questions were answered. The patient knows to call the clinic with any problems, questions or concerns. No barriers to learning was detected. I spent 15 minutes counseling the patient face to face.  The total time spent in the appointment was 20 minutes and more than 50% was on counseling and review of test results     Heath Lark, MD 01/06/2018 8:18 AM

## 2018-01-13 ENCOUNTER — Other Ambulatory Visit: Payer: Self-pay | Admitting: Hematology and Oncology

## 2018-01-13 DIAGNOSIS — C9001 Multiple myeloma in remission: Secondary | ICD-10-CM

## 2018-01-13 NOTE — Telephone Encounter (Signed)
-

## 2018-01-25 ENCOUNTER — Other Ambulatory Visit: Payer: Self-pay | Admitting: *Deleted

## 2018-01-25 DIAGNOSIS — C9001 Multiple myeloma in remission: Secondary | ICD-10-CM

## 2018-01-25 MED ORDER — LENALIDOMIDE 5 MG PO CAPS: ORAL_CAPSULE | ORAL | 0 refills | Status: DC

## 2018-02-01 NOTE — Progress Notes (Signed)
01/05/18 Patient in to clinic unaccompanied today for evaluation prior to beginning treatment Cycle 40. Adverse events documented in table and list below. Based on lab results review and history and physical by Dr. Alvy Bimler, patient meets criteria for continued treatment with Revlimid. Patient returned completed cycle 39 Medication Calendar, confirming dosing at dose level -3, Revlimid 5mg  every other day, Days 1-21, however, patient forgot one dose, on 12/24/17. Patient given printed appointment calendar with Cycle 40 treatment days marked, to document doses for the next treatment cycle, which will begin today.  Adverse Event Log Study/Protocol: [facility] ECOG E1A11 Maintenance Cycles 37-39: 10/06/2017 - 01/05/2018 (end of Cycle 39= 01/04/18) Event Grade Attribution to lenalidomide Cycle # Comments  Fatigue  Grade 2  Probable 37-39 Moderate, occasionally limiting ADLs  Sensory neuropathy  Grade 2 Unlikely 37-39 Moderate symptoms, unchanged from previous  Insomnia  Grade 2  Unlikely 37-39 Moderate difficulty, takes sleeping pill regularly  Nausea Grade 1 Possible 38 x 1 day (11/28/17), minimal effect on oral intake  Neutrophil count decreased Grade 2-3  Definite 37-39    Anemia Grade 1 Definite 38    Diarrhea Grade 1 Probable 38 x 1 day (11/05/17) 4-5 max # stools in 24 hr  Non-reportable AEs - unsolicited, < Grade 3 (grade): - Headaches - moderate (2) - Leukopenia (1) - Moderate pain (2) . Joint pain - hip, knee . Back pain . Bone pain - Numbness, back of legs (2) - Bruising, right calf (1) - Upper respiratory infection (2) - Coccygeal pain (2) - Stomatitis (1) [name] BSN, RN, [place] 02/01/2018 10:46 AM

## 2018-02-02 ENCOUNTER — Inpatient Hospital Stay: Payer: Medicare Other | Attending: Hematology and Oncology

## 2018-02-02 ENCOUNTER — Inpatient Hospital Stay: Payer: Medicare Other | Admitting: *Deleted

## 2018-02-02 ENCOUNTER — Other Ambulatory Visit: Payer: Medicare Other

## 2018-02-02 ENCOUNTER — Encounter: Payer: Self-pay | Admitting: Hematology and Oncology

## 2018-02-02 DIAGNOSIS — C9001 Multiple myeloma in remission: Secondary | ICD-10-CM

## 2018-02-02 DIAGNOSIS — Z006 Encounter for examination for normal comparison and control in clinical research program: Secondary | ICD-10-CM | POA: Insufficient documentation

## 2018-02-02 LAB — CBC WITH DIFFERENTIAL (CANCER CENTER ONLY)
Basophils Absolute: 0 10*3/uL (ref 0.0–0.1)
Basophils Relative: 1 %
Eosinophils Absolute: 0.1 10*3/uL (ref 0.0–0.5)
Eosinophils Relative: 3 %
HCT: 38.5 % (ref 34.8–46.6)
Hemoglobin: 11.9 g/dL (ref 11.6–15.9)
Lymphocytes Relative: 60 %
Lymphs Abs: 2.6 10*3/uL (ref 0.9–3.3)
MCH: 23.1 pg — ABNORMAL LOW (ref 25.1–34.0)
MCHC: 30.9 g/dL — ABNORMAL LOW (ref 31.5–36.0)
MCV: 74.8 fL — ABNORMAL LOW (ref 79.5–101.0)
Monocytes Absolute: 0.5 10*3/uL (ref 0.1–0.9)
Monocytes Relative: 11 %
Neutro Abs: 1.1 10*3/uL — ABNORMAL LOW (ref 1.5–6.5)
Neutrophils Relative %: 25 %
Platelet Count: 203 10*3/uL (ref 145–400)
RBC: 5.15 MIL/uL (ref 3.70–5.45)
RDW: 15.1 % — ABNORMAL HIGH (ref 11.2–14.5)
WBC Count: 4.3 10*3/uL (ref 3.9–10.3)

## 2018-02-02 MED ORDER — LENALIDOMIDE 5 MG PO CAPS CTSU E1A11: ORAL_CAPSULE | ORAL | 0 refills | Status: DC

## 2018-02-02 NOTE — Progress Notes (Signed)
02/02/2018 Patient in to clinic unaccompanied today for hematology tests prior to beginning maintenance treatment Cycle 41. Patient reports ongoing symptoms of back pain daily, occasional bone pain, and continued, but not worsening, peripheral sensory neuropathy, but no GI symptoms this cycle. She reports some increase in fatigue, though still grade 2, primarily due to extra responsibilities as her husband recovers from knee surgery.  She denies any symptoms of nausea, vomiting, diarrhea, shortness of breath, swelling in arms or legs, fever, irritability, muscle weakness, peripheral motor neuropathy or acneiform rash. Patient returned completed cycle 40 Medication Calendar, confirming dosing at dose level -3, Revlimid 5mg  every other day, Days 1-21. Patient was given printed appointment calendar with Cycle 41 days marked. Patient instructed not to begin dosing with Revlimid until she has been contacted about the results of today's CBC. Patient verbalized understanding and stated to contact her via home phone since she will be driving. Following patient's departure, CBC results were obtained showing ANC of 1100, which is acceptable for continued treatment. Called patient's home phone and left a message with her husband, Arnell Sieving, to notify patient that counts were acceptable today and that she can begin her next cycle of dosing with Revlimid. Patient reports the following symptoms that occurred during the course of Cycle 40:  Adverse Event Log Study/Protocol: [facility] ECOG [id number] Maintenance Cycle 40: 01/05/2018 - 02/02/2018 (End of cycle = 02/01/18) Event Grade Comments  Fatigue  Grade 2  Moderate, occasionally limiting ADLs  Sensory neuropathy  Grade 2 Moderate symptoms, unchanged from previous  Insomnia  Grade 2  Moderate difficulty, takes sleeping pill regularly.  Neutrophil count decreased Grade 2      Non-reportable AEs (grade): - Moderate pain (2)  Back pain  Bone pain  Knee pain - Migraine headaches  (2)  [name] BSN, RN, [place] 02/02/2018 9:46 AM

## 2018-03-02 ENCOUNTER — Inpatient Hospital Stay: Payer: Medicare Other | Admitting: *Deleted

## 2018-03-02 ENCOUNTER — Inpatient Hospital Stay: Payer: Medicare Other | Attending: Hematology and Oncology

## 2018-03-02 ENCOUNTER — Encounter: Payer: Self-pay | Admitting: Hematology and Oncology

## 2018-03-02 DIAGNOSIS — G893 Neoplasm related pain (acute) (chronic): Secondary | ICD-10-CM | POA: Diagnosis not present

## 2018-03-02 DIAGNOSIS — Z006 Encounter for examination for normal comparison and control in clinical research program: Secondary | ICD-10-CM | POA: Insufficient documentation

## 2018-03-02 DIAGNOSIS — C9001 Multiple myeloma in remission: Secondary | ICD-10-CM | POA: Insufficient documentation

## 2018-03-02 DIAGNOSIS — R202 Paresthesia of skin: Secondary | ICD-10-CM | POA: Insufficient documentation

## 2018-03-02 DIAGNOSIS — D61811 Other drug-induced pancytopenia: Secondary | ICD-10-CM | POA: Insufficient documentation

## 2018-03-02 LAB — CBC WITH DIFFERENTIAL (CANCER CENTER ONLY)
Basophils Absolute: 0 10*3/uL (ref 0.0–0.1)
Basophils Relative: 1 %
Eosinophils Absolute: 0.1 10*3/uL (ref 0.0–0.5)
Eosinophils Relative: 4 %
HCT: 38.9 % (ref 34.8–46.6)
Hemoglobin: 11.9 g/dL (ref 11.6–15.9)
Lymphocytes Relative: 55 %
Lymphs Abs: 2 10*3/uL (ref 0.9–3.3)
MCH: 23.1 pg — ABNORMAL LOW (ref 25.1–34.0)
MCHC: 30.6 g/dL — ABNORMAL LOW (ref 31.5–36.0)
MCV: 75.5 fL — ABNORMAL LOW (ref 79.5–101.0)
Monocytes Absolute: 0.4 10*3/uL (ref 0.1–0.9)
Monocytes Relative: 11 %
Neutro Abs: 1 10*3/uL — ABNORMAL LOW (ref 1.5–6.5)
Neutrophils Relative %: 29 %
Platelet Count: 180 10*3/uL (ref 145–400)
RBC: 5.15 MIL/uL (ref 3.70–5.45)
RDW: 15.1 % — ABNORMAL HIGH (ref 11.2–14.5)
WBC Count: 3.6 10*3/uL — ABNORMAL LOW (ref 3.9–10.3)

## 2018-03-02 MED ORDER — LENALIDOMIDE 5 MG PO CAPS CTSU E1A11: ORAL_CAPSULE | ORAL | 0 refills | Status: DC

## 2018-03-02 NOTE — Progress Notes (Signed)
03/02/2018 Patient in to clinic unaccompanied today for hematology tests prior to beginning maintenance treatment Cycle 42. Patient reports ongoing symptoms of back pain daily, occasional bone pain, and continued, but not worsening, peripheral sensory neuropathy, fatigue and insomnia. She denies any GI symptoms this cycle. She denies any symptoms of nausea, vomiting, diarrhea, shortness of breath, swelling in arms or legs, fever, irritability, muscle weakness, peripheral motor neuropathy or acneiform rash. Patient returned completed cycle 41 Medication Calendar, confirming dosing at dose level -3, Revlimid 5mg  every other day, Days 1-21. Patient was given printed appointment calendar with Cycle 42 treatment days marked. Patient instructed not to begin dosing with Revlimid until she has been contacted about the results of today's CBC. Patient verbalized understanding and stated to contact her via home phone since she will be driving. Following patient's departure, CBC results were obtained showing ANC of 1000, which is acceptable for continued treatment. Called patient's home phone and left a message with her husband, Arnell Sieving, to notify patient that counts were acceptable today and that she can begin her next cycle of dosing with Revlimid today.  Adverse Event Log Study/Protocol: [facility] ECOG [id number] Maintenance Cycle 41: 02/02/2018 - 03/02/2018 (End of cycle = 03/01/18) Event Grade Comments  Fatigue  Grade 2  Moderate, occasionally limiting ADLs  Sensory neuropathy  Grade 2 Moderate symptoms, unchanged from previous  Insomnia  Grade 2  Moderate difficulty, takes sleeping pill regularly.  Neutrophil count decreased Grade 2     Non-reportable AEs (grade): - Leukopenia (1) - Moderate pain (2)  Back pain  Spine pain  Bone pain  Joint pain - knee, hip, ankle, wrist  Leg pain - Migraine headaches (2) - Numbness in back (02/23/18 - grade 1) [name] BSN, RN, CCRP 03/02/2018 10:40 AM

## 2018-03-13 ENCOUNTER — Other Ambulatory Visit: Payer: Self-pay | Admitting: Hematology and Oncology

## 2018-03-13 DIAGNOSIS — C9001 Multiple myeloma in remission: Secondary | ICD-10-CM

## 2018-03-23 ENCOUNTER — Telehealth: Payer: Self-pay | Admitting: *Deleted

## 2018-03-23 ENCOUNTER — Inpatient Hospital Stay: Payer: Medicare Other

## 2018-03-23 DIAGNOSIS — C9001 Multiple myeloma in remission: Secondary | ICD-10-CM

## 2018-03-23 LAB — CBC WITH DIFFERENTIAL (CANCER CENTER ONLY)
Basophils Absolute: 0 10*3/uL (ref 0.0–0.1)
Basophils Relative: 0 %
Eosinophils Absolute: 0.1 10*3/uL (ref 0.0–0.5)
Eosinophils Relative: 4 %
HCT: 38.3 % (ref 34.8–46.6)
Hemoglobin: 12.1 g/dL (ref 11.6–15.9)
Lymphocytes Relative: 55 %
Lymphs Abs: 1.8 10*3/uL (ref 0.9–3.3)
MCH: 23.4 pg — ABNORMAL LOW (ref 25.1–34.0)
MCHC: 31.6 g/dL (ref 31.5–36.0)
MCV: 74.1 fL — ABNORMAL LOW (ref 79.5–101.0)
Monocytes Absolute: 0.4 10*3/uL (ref 0.1–0.9)
Monocytes Relative: 11 %
Neutro Abs: 1 10*3/uL — ABNORMAL LOW (ref 1.5–6.5)
Neutrophils Relative %: 30 %
Platelet Count: 189 10*3/uL (ref 145–400)
RBC: 5.17 MIL/uL (ref 3.70–5.45)
RDW: 15.1 % — ABNORMAL HIGH (ref 11.2–14.5)
WBC Count: 3.3 10*3/uL — ABNORMAL LOW (ref 3.9–10.3)

## 2018-03-23 LAB — TSH: TSH: 1.861 u[IU]/mL (ref 0.308–3.960)

## 2018-03-23 LAB — CMP (CANCER CENTER ONLY)
ALT: 33 U/L (ref 0–55)
AST: 22 U/L (ref 5–34)
Albumin: 3.8 g/dL (ref 3.5–5.0)
Alkaline Phosphatase: 54 U/L (ref 40–150)
Anion gap: 9 (ref 3–11)
BUN: 12 mg/dL (ref 7–26)
CO2: 25 mmol/L (ref 22–29)
Calcium: 9.6 mg/dL (ref 8.4–10.4)
Chloride: 105 mmol/L (ref 98–109)
Creatinine: 0.97 mg/dL (ref 0.60–1.10)
GFR, Est AFR Am: >60 mL/min (ref >60)
GFR, Estimated: >60 mL/min (ref >60)
Glucose, Bld: 92 mg/dL (ref 70–140)
Potassium: 3.6 mmol/L (ref 3.5–5.1)
Sodium: 139 mmol/L (ref 136–145)
Total Bilirubin: 0.5 mg/dL (ref 0.2–1.2)
Total Protein: 7.6 g/dL (ref 6.4–8.3)

## 2018-03-23 LAB — LACTATE DEHYDROGENASE: LDH: 178 U/L (ref 125–245)

## 2018-03-23 NOTE — Telephone Encounter (Signed)
[facility] called for authorization number for recent Revlimid refill done by Dr Alvy Bimler. Auth # [id number]

## 2018-03-24 LAB — KAPPA/LAMBDA LIGHT CHAINS
Kappa free light chain: 24.4 mg/L — ABNORMAL HIGH (ref 3.3–19.4)
Kappa, lambda light chain ratio: 1.47 (ref 0.26–1.65)
Lambda free light chains: 16.6 mg/L (ref 5.7–26.3)

## 2018-03-26 LAB — MULTIPLE MYELOMA PANEL, SERUM
Albumin SerPl Elph-Mcnc: 3.6 g/dL (ref 2.9–4.4)
Albumin/Glob SerPl: 1.1 (ref 0.7–1.7)
Alpha 1: 0.2 g/dL (ref 0.0–0.4)
Alpha2 Glob SerPl Elph-Mcnc: 0.6 g/dL (ref 0.4–1.0)
B-Globulin SerPl Elph-Mcnc: 1.1 g/dL (ref 0.7–1.3)
Gamma Glob SerPl Elph-Mcnc: 1.7 g/dL (ref 0.4–1.8)
Globulin, Total: 3.5 g/dL (ref 2.2–3.9)
IgA: 132 mg/dL (ref 87–352)
IgG (Immunoglobin G), Serum: 1777 mg/dL — ABNORMAL HIGH (ref 700–1600)
IgM (Immunoglobulin M), Srm: 71 mg/dL (ref 26–217)
Total Protein ELP: 7.1 g/dL (ref 6.0–8.5)

## 2018-03-30 ENCOUNTER — Encounter: Payer: Self-pay | Admitting: Hematology and Oncology

## 2018-03-30 ENCOUNTER — Telehealth: Payer: Self-pay | Admitting: Hematology and Oncology

## 2018-03-30 ENCOUNTER — Inpatient Hospital Stay: Payer: Medicare Other | Admitting: Hematology and Oncology

## 2018-03-30 ENCOUNTER — Inpatient Hospital Stay: Payer: Medicare Other | Admitting: *Deleted

## 2018-03-30 DIAGNOSIS — Z006 Encounter for examination for normal comparison and control in clinical research program: Secondary | ICD-10-CM

## 2018-03-30 DIAGNOSIS — C9001 Multiple myeloma in remission: Secondary | ICD-10-CM

## 2018-03-30 DIAGNOSIS — R202 Paresthesia of skin: Secondary | ICD-10-CM

## 2018-03-30 DIAGNOSIS — G893 Neoplasm related pain (acute) (chronic): Secondary | ICD-10-CM | POA: Diagnosis not present

## 2018-03-30 DIAGNOSIS — D61811 Other drug-induced pancytopenia: Secondary | ICD-10-CM | POA: Diagnosis not present

## 2018-03-30 DIAGNOSIS — E538 Deficiency of other specified B group vitamins: Secondary | ICD-10-CM

## 2018-03-30 MED ORDER — METHOCARBAMOL 500 MG PO TABS: 500.0000 mg | ORAL_TABLET | Freq: Three times a day (TID) | ORAL | 1 refills | Status: DC | PRN

## 2018-03-30 MED ORDER — OXYCODONE HCL 15 MG PO TABS: 15.0000 mg | ORAL_TABLET | Freq: Three times a day (TID) | ORAL | 0 refills | Status: DC | PRN

## 2018-03-30 MED ORDER — LENALIDOMIDE 5 MG PO CAPS CTSU E1A11: ORAL_CAPSULE | ORAL | 0 refills | Status: DC

## 2018-03-30 NOTE — Assessment & Plan Note (Signed)
She have chronic bone pain which started with a diagnosis of multiple myeloma. We discussed chronic pain management. MRI did suggest she had evidence of degenerative disc disease and spinal stenosis. After much discussion, the patient is comfortable to remain on chronic narcotic prescription. I refilled her prescriptions We discussed narcotic refill policy 

## 2018-03-30 NOTE — Progress Notes (Signed)
[place] OFFICE PROGRESS NOTE  Patient Care Team: Wenda Low, MD as PCP - General (Internal Medicine) Heath Lark, MD as Consulting Physician (Hematology and Oncology) Benson Norway, RN as Registered Nurse (Oncology)  ASSESSMENT & PLAN:  Multiple myeloma in remission [facility]) I reviewed test results with her  So far, her last bone marrow biopsy and recent blood work confirmed complete remission. The patient is comfortable to remain on Revlimid indefinitely. She will continue treatment per research protocol She will continue calcium with vitamin D supplement along with aspirin for DVT prophylaxis. 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 I will see her back as scheduled  Paresthesias She has been complaining of paresthesias and tingling sensation all over without any dermatome distribution This is highly unusual for side effects of treatment are related to multiple myeloma I recommend consultation with urologist for further evaluation I plan to recheck serum vitamin B12 level in her next blood draw  Drug-induced pancytopenia ([place]) This is likely due to recent treatment. The patient denies recent history of fevers, cough, chills, diarrhea or dysuria. She is asymptomatic from the leukopenia. I will observe for now.  I will continue the chemotherapy at current dose without dosage adjustment.  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 We discussed chronic pain management. MRI did suggest she had evidence of degenerative disc disease and spinal stenosis. After much discussion, the patient is comfortable to remain on chronic narcotic prescription. I refilled her prescriptions We discussed narcotic refill policy   Orders Placed This  Encounter  Procedures  . Vitamin B12    Standing Status:   Future    Standing Expiration Date:   05/04/2019    INTERVAL HISTORY: Please see below for problem oriented charting. She returns with her husband for further follow-up She has been complaining of diffuse paresthesias throughout without any dermatological distribution She denies recent neurological deficits She complained of muscle spasm after exercise that comes and goes Her diffuse bone pain is responding well to current prescription of pain medicine She denies recent infection  SUMMARY OF ONCOLOGIC HISTORY: Oncology History   ISS stage 1 IgG lambda subtype (serum albumin 3.6, Beta2 microglobulin 2.32) Durie Salmon Stage 1     Multiple myeloma in remission ([place])   10/10/2013 Imaging    Skeletal survery was negative      11/09/2013 Bone Marrow Biopsy    BM biopsy confirmed myeloma, 76% involved, IgG lambda subtype      12/06/2013 - 08/29/2014 Chemotherapy    Sh received chemo with revlimid, Velcade, Dexamethasone and Zometa. Patient particpated in clinical research [facility] [phone]      02/23/2014 Bone Marrow Biopsy    Repeat bone marrow biopsy showed 5% involvement      03/31/2014 Adverse Reaction    Zometa was discontinued due to osteonecrosis of the jaw.      05/05/2014 Imaging    Imaging study of the neck showed no explanation that could cause right neck pain. She is noted to have incidental left upper lung nodule. Plan to repeat imaging study in 3 months.      09/06/2014 Imaging    Bone survey showed no evidence of fracture      09/14/2014 Bone Marrow Biopsy    Bone marrow  biopsy showed 8% residual plasma cells by manual count but none on the biopsy specimen      09/26/2014 -  Chemotherapy    She is started on cycle 1 of maintenance Revlimid      01/31/2015 Imaging     chest x-ray showed pneumonia. Treatment was placed on hold.      05/03/2015 Bone Marrow Biopsy    Accession: [id number] repeat bone marrow  aspirate and biopsy show 5% residual plasma cells      10/14/2016 Bone Marrow Biopsy    Bone marrow biopsy showed the plasma cells represent 4% of all cells with lack of large aggregates or sheets. To assess the plasma cell clonality, immunohistochemical stains is performed and it lack clonality. Normal cytogenetics and FISH      01/09/2017 Imaging    CT chest showed ground-glass 1.5 cm apical left upper lobe pulmonary nodule. Initial follow-up with CT at 6-12 months is recommended to confirm persistence. If persistent, repeat CT is recommended every 2 years until 5 years of stability has been established. This recommendation follows the consensus statement: Guidelines for Management of Incidental Pulmonary Nodules Detected on CT Images: From the Fleischner Society 2017; Radiology 2017; 284:228-243. 2. Mild patchy ground-glass opacities in the right upper lobe, probably inflammatory, which can also be reassessed on follow-up chest CT performed for the above dominant ground-glass nodule. 3. Solitary 3 mm right lower lobe solid pulmonary nodule, which can also be reassessed on follow-up chest CT performed for the above dominant ground-glass nodule. 4. No thoracic adenopathy. 5. Aortic atherosclerosis. Two-vessel coronary atherosclerosis.      06/30/2017 Imaging    1. No interval change in the 11 x 13 mm ground-glass nodule left upper lobe. Given the nearly 6 months of imaging stability, repeat CT is recommended every 2 years until 5 years of stability has been established. This recommendation follows the consensus statement: Guidelines for Management of Incidental Pulmonary Nodules Detected on CT Images: From the Fleischner Society 2017; Radiology 2017; 284:228-243. 2. Interval resolution of the tiny patchy ground-glass nodules right upper lobe, likely infectious/inflammatory etiology. 3. Stable 3 mm right lower lobe pulmonary nodule. Attention on follow-up recommended. 4. Aortic Atherosclerois  (ICD10-170.0)      09/29/2017 Imaging    Skeletal survey 1. Questionable new tiny lucency noted the posterior portion of C3 vertebral body. This may represent a small lytic lesion.  2. No definite thoracic lesion noted on today's exam. Stable lucencies in the left ilium and acetabulum.  3. No other focal abnormalities identified. The left hip is unremarkable .       REVIEW OF SYSTEMS:   Constitutional: Denies fevers, chills or abnormal weight loss Eyes: Denies blurriness of vision Ears, nose, mouth, throat, and face: Denies mucositis or sore throat Respiratory: Denies cough, dyspnea or wheezes Cardiovascular: Denies palpitation, chest discomfort or lower extremity swelling Gastrointestinal:  Denies nausea, heartburn or change in bowel habits Skin: Denies abnormal skin rashes Lymphatics: Denies new lymphadenopathy or easy bruising Behavioral/Psych: Mood is stable, no new changes  All other systems were reviewed with the patient and are negative.  I have reviewed the past medical history, past surgical history, social history and family history with the patient and they are unchanged from previous note.  ALLERGIES:  has No Known Allergies.  MEDICATIONS:  Current Outpatient Medications  Medication Sig Dispense Refill  . aspirin 325 MG tablet Take 1 tablet (325 mg total) by mouth daily.    . Cholecalciferol (VITAMIN D3) 2000 UNITS TABS  Take 2,000 Units by mouth daily.    . hydrochlorothiazide (HYDRODIURIL) 25 MG tablet Take 25 mg by mouth every evening.     Marland Kitchen lenalidomide (REVLIMID) 5 MG capsule Take 1 capsule every other day for 21 days, off 7 days. Repeat every 28 days. 11 capsule 0  . levothyroxine (SYNTHROID, LEVOTHROID) 75 MCG tablet Take 75 mcg by mouth daily before breakfast.    . methocarbamol (ROBAXIN) 500 MG tablet Take 1 tablet (500 mg total) by mouth every 8 (eight) hours as needed for muscle spasms. 90 tablet 1  . Multiple Vitamins-Minerals (CENTRUM SILVER PO) Take  1 tablet by mouth daily.     . ONGLYZA 5 MG TABS tablet Take 5 mg by mouth daily.     Marland Kitchen oxyCODONE (ROXICODONE) 15 MG immediate release tablet Take 1 tablet (15 mg total) by mouth 3 (three) times daily as needed for pain. 90 tablet 0  . polyethylene glycol (MIRALAX / GLYCOLAX) packet Take 17 g by mouth daily as needed for mild constipation.     . RESTASIS 0.05 % ophthalmic emulsion PLACE 1 DROP IN BOTH EYES IN THE EVENING  4  . traMADol (ULTRAM) 50 MG tablet Take 1 tablet (50 mg total) by mouth every 6 (six) hours as needed for moderate pain. (Patient not taking: Reported on 09/19/2017) 90 tablet 0  . traZODone (DESYREL) 50 MG tablet TAKE 1 TABLET (50 MG TOTAL) BY MOUTH AT BEDTIME. 30 tablet 6  . venlafaxine XR (EFFEXOR-XR) 150 MG 24 hr capsule Take 1 capsule (150 mg total) by mouth daily with breakfast. 90 capsule 3  . verapamil (VERELAN PM) 120 MG 24 hr capsule Take 1 capsule by mouth  daily 30 capsule 11  . vitamin E (VITAMIN E) 1000 UNIT capsule Take 400 Units by mouth daily.      No current facility-administered medications for this visit.    Facility-Administered Medications Ordered in Other Visits  Medication Dose Route Frequency Provider Last Rate Last Dose  . gadopentetate dimeglumine (MAGNEVIST) injection 15 mL  15 mL Intravenous Once PRN Melvenia Beam, MD        PHYSICAL EXAMINATION: ECOG PERFORMANCE STATUS: 1 - Symptomatic but completely ambulatory  Vitals:   03/30/18 0828  BP: 133/76  Pulse: 66  Resp: 18  Temp: 98.6 F (37 C)  SpO2: 100%   Filed Weights   03/30/18 0828  Weight: 173 lb 6.4 oz (78.7 kg)    GENERAL:alert, no distress and comfortable SKIN: skin color, texture, turgor are normal, no rashes or significant lesions EYES: normal, Conjunctiva are pink and non-injected, sclera clear OROPHARYNX:no exudate, no erythema and lips, buccal mucosa, and tongue normal  NECK: supple, thyroid normal size, non-tender, without nodularity LYMPH:  no palpable  lymphadenopathy in the cervical, axillary or inguinal LUNGS: clear to auscultation and percussion with normal breathing effort HEART: regular rate & rhythm and no murmurs and no lower extremity edema ABDOMEN:abdomen soft, non-tender and normal bowel sounds Musculoskeletal:no cyanosis of digits and no clubbing  NEURO: alert & oriented x 3 with fluent speech, no focal motor/sensory deficits  LABORATORY DATA:  I have reviewed the data as listed    Component Value Date/Time   NA 139 03/23/2018 0749   NA 141 09/29/2017 0830   K 3.6 03/23/2018 0749   K 3.6 09/29/2017 0830   CL 105 03/23/2018 0749   CO2 25 03/23/2018 0749   CO2 29 09/29/2017 0830   GLUCOSE 92 03/23/2018 0749   GLUCOSE 87 09/29/2017 0830  BUN 12 03/23/2018 0749   BUN 14.0 09/29/2017 0830   CREATININE 0.97 03/23/2018 0749   CREATININE 0.9 09/29/2017 0830   CALCIUM 9.6 03/23/2018 0749   CALCIUM 9.6 09/29/2017 0830   PROT 7.6 03/23/2018 0749   PROT 7.3 09/29/2017 0832   PROT 8.0 09/29/2017 0830   ALBUMIN 3.8 03/23/2018 0749   ALBUMIN 3.8 09/29/2017 0830   AST 22 03/23/2018 0749   AST 21 09/29/2017 0830   ALT 33 03/23/2018 0749   ALT 69 (H) 09/29/2017 0830   ALKPHOS 54 03/23/2018 0749   ALKPHOS 60 09/29/2017 0830   BILITOT 0.5 03/23/2018 0749   BILITOT 0.46 09/29/2017 0830   GFRNONAA >60 03/23/2018 0749   GFRAA >60 03/23/2018 0749    No results found for: SPEP, UPEP  Lab Results  Component Value Date   WBC 3.3 (L) 03/23/2018   NEUTROABS 1.0 (L) 03/23/2018   HGB 12.1 03/23/2018   HCT 38.3 03/23/2018   MCV 74.1 (L) 03/23/2018   PLT 189 03/23/2018      Chemistry      Component Value Date/Time   NA 139 03/23/2018 0749   NA 141 09/29/2017 0830   K 3.6 03/23/2018 0749   K 3.6 09/29/2017 0830   CL 105 03/23/2018 0749   CO2 25 03/23/2018 0749   CO2 29 09/29/2017 0830   BUN 12 03/23/2018 0749   BUN 14.0 09/29/2017 0830   CREATININE 0.97 03/23/2018 0749   CREATININE 0.9 09/29/2017 0830      Component  Value Date/Time   CALCIUM 9.6 03/23/2018 0749   CALCIUM 9.6 09/29/2017 0830   ALKPHOS 54 03/23/2018 0749   ALKPHOS 60 09/29/2017 0830   AST 22 03/23/2018 0749   AST 21 09/29/2017 0830   ALT 33 03/23/2018 0749   ALT 69 (H) 09/29/2017 0830   BILITOT 0.5 03/23/2018 0749   BILITOT 0.46 09/29/2017 0830     All questions were answered. The patient knows to call the clinic with any problems, questions or concerns. No barriers to learning was detected.  I spent 15 minutes counseling the patient face to face. The total time spent in the appointment was 20 minutes and more than 50% was on counseling and review of test results  Heath Lark, MD 03/30/2018 4:05 PM

## 2018-03-30 NOTE — Telephone Encounter (Signed)
Gave patient AVs and calendar of upcoming July appointments.  °

## 2018-03-30 NOTE — Assessment & Plan Note (Signed)
I reviewed test results with her  So far, her last bone marrow biopsy and recent blood work confirmed complete remission. The patient is comfortable to remain on Revlimid indefinitely. She will continue treatment per research protocol She will continue calcium with vitamin D supplement along with aspirin for DVT prophylaxis. 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 I will see her back as scheduled

## 2018-03-30 NOTE — Assessment & Plan Note (Signed)
She has been complaining of paresthesias and tingling sensation all over without any dermatome distribution This is highly unusual for side effects of treatment are related to multiple myeloma I recommend consultation with urologist for further evaluation I plan to recheck serum vitamin B12 level in her next blood draw

## 2018-03-30 NOTE — Assessment & Plan Note (Signed)
This is likely due to recent treatment. The patient denies recent history of fevers, cough, chills, diarrhea or dysuria. She is asymptomatic from the leukopenia. I will observe for now.  I will continue the chemotherapy at current dose without dosage adjustment.  If the leukopenia gets progressive worse in the future, I might have to delay her treatment or adjust the chemotherapy dose per protocol

## 2018-03-31 NOTE — Progress Notes (Signed)
03/30/2018 Patient in to clinic accompanied by her husband, Arnell Sieving, today for evaluation prior to beginning treatment Cycle 43. Based on lab results review and history and physical by Dr. Alvy Bimler, patient meets criteria for continued treatment with Revlimid. Patient returned completed cycle 42 Medication Calendar, confirming dosing at dose level -3, Revlimid 5mg  every other day, Days 1-21, with no missed doses. Patient given printed appointment calendar with Cycle 43 treatment days marked, to document doses for the next treatment cycle, which will begin today. [name] BSN, RN, [place] 03/30/2018 11:17 AM  Adverse Event Log Study/Protocol: [facility] ECOG E1A11 Maintenance Cycles 40-42: 01/05/2018 - 03/30/2018 (end of Cycle 42= 03/29/18) Event Grade Attribution to lenalidomide Cycle # Comments  Fatigue  Grade 2  Probable 40-42 Moderate, occasionally limiting ADLs (unchanged from previous)  Sensory neuropathy  Grade 2 Unlikely 40-42 Moderate symptoms, unchanged from previous  Insomnia  Grade 2  Unlikely 40-42 Moderate difficulty, takes sleeping pill regularly, unchanged  Neutrophil count decreased Grade 2  Definite 40-42    Diarrhea Grade 1 Probable 42 Multiple evenings, 3 stools over baseline, related to diet (beans, cruciferous vegetables)  Non-reportable AEs - unsolicited, < Grade 3 (grade): - Headaches/migraines - moderate (2) - Leukopenia (1) - Moderate pain (2) . Joint pain - hip, knee, ankle, wrist . Back pain . Bone pain . Spine pain . Leg pain . Muscle pain - Numbness, back (1) - Abdominal pain (1) - Leg cramps (2) - Left side weakness (1) - Weight loss (1) - Bradycardia (1) - Paresthesias (2) [name] BSN, RN, [place] 04/02/2018 2:39 PM

## 2018-04-08 ENCOUNTER — Other Ambulatory Visit: Payer: Self-pay | Admitting: Hematology and Oncology

## 2018-04-21 ENCOUNTER — Other Ambulatory Visit: Payer: Self-pay

## 2018-04-21 ENCOUNTER — Other Ambulatory Visit: Payer: Self-pay | Admitting: Hematology and Oncology

## 2018-04-21 DIAGNOSIS — C9001 Multiple myeloma in remission: Secondary | ICD-10-CM

## 2018-04-21 MED ORDER — LENALIDOMIDE 5 MG PO CAPS
ORAL_CAPSULE | ORAL | 0 refills | Status: DC
Start: 2018-04-21 — End: 2018-04-27

## 2018-04-26 ENCOUNTER — Other Ambulatory Visit: Payer: Self-pay | Admitting: Neurology

## 2018-04-27 ENCOUNTER — Inpatient Hospital Stay: Payer: Medicare Other | Attending: Hematology and Oncology

## 2018-04-27 ENCOUNTER — Inpatient Hospital Stay: Payer: Medicare Other | Admitting: *Deleted

## 2018-04-27 ENCOUNTER — Encounter: Payer: Self-pay | Admitting: Hematology and Oncology

## 2018-04-27 DIAGNOSIS — C9001 Multiple myeloma in remission: Secondary | ICD-10-CM | POA: Diagnosis not present

## 2018-04-27 DIAGNOSIS — R202 Paresthesia of skin: Secondary | ICD-10-CM

## 2018-04-27 DIAGNOSIS — Z006 Encounter for examination for normal comparison and control in clinical research program: Secondary | ICD-10-CM | POA: Insufficient documentation

## 2018-04-27 DIAGNOSIS — E538 Deficiency of other specified B group vitamins: Secondary | ICD-10-CM

## 2018-04-27 LAB — CBC WITH DIFFERENTIAL (CANCER CENTER ONLY)
Basophils Absolute: 0 10*3/uL (ref 0.0–0.1)
Basophils Relative: 0 %
Eosinophils Absolute: 0.1 10*3/uL (ref 0.0–0.5)
Eosinophils Relative: 3 %
HCT: 39.3 % (ref 34.8–46.6)
Hemoglobin: 12.2 g/dL (ref 11.6–15.9)
Lymphocytes Relative: 55 %
Lymphs Abs: 1.9 10*3/uL (ref 0.9–3.3)
MCH: 23.3 pg — ABNORMAL LOW (ref 25.1–34.0)
MCHC: 31 g/dL — ABNORMAL LOW (ref 31.5–36.0)
MCV: 75 fL — ABNORMAL LOW (ref 79.5–101.0)
Monocytes Absolute: 0.4 10*3/uL (ref 0.1–0.9)
Monocytes Relative: 13 %
Neutro Abs: 1 10*3/uL — ABNORMAL LOW (ref 1.5–6.5)
Neutrophils Relative %: 29 %
Platelet Count: 193 10*3/uL (ref 145–400)
RBC: 5.24 MIL/uL (ref 3.70–5.45)
RDW: 15.2 % — ABNORMAL HIGH (ref 11.2–14.5)
WBC Count: 3.5 10*3/uL — ABNORMAL LOW (ref 3.9–10.3)

## 2018-04-27 LAB — VITAMIN B12: Vitamin B-12: 642 pg/mL (ref 180–914)

## 2018-04-27 MED ORDER — LENALIDOMIDE 5 MG PO CAPS CTSU E1A11: ORAL_CAPSULE | ORAL | 0 refills | Status: DC

## 2018-04-27 NOTE — Progress Notes (Signed)
04/27/2018 Patient in to clinic today for hematology tests prior to beginning maintenance treatment Cycle 44. Patient reports ongoing symptoms as noted in table below. She denies any symptoms of nausea, vomiting, shortness of breath, swelling in arms or legs, fever, irritability, muscle weakness, peripheral motor neuropathy or acneiform rash. Patient returned completed cycle 43 Medication Calendar, confirming dosing at dose level -3, Revlimid 5mg  every other day, Days 1-21. Patient was given printed appointment calendar with Cycle 44 treatment days marked. Patient instructed not to begin dosing with Revlimid until she has been contacted about the results of today's CBC. Patient verbalized understanding and stated to contact her via home phone, and to leave a message if she has not yet returned home from her appointment. [name] BSN, RN, [place] 04/27/2018 8:10 AM  04/27/2018 Left voice mail message for patient at home regarding CBC results acceptable for continued treatment. Patient to begin dosing as instructed today, with return visit on 05/25/2018 prior to the next treatment cycle.  [name] BSN, RN, CCRP 04/27/2018 8:46 AM  Adverse Event Log Study/Protocol: [facility] ECOG E1A11 Maintenance Cycle 43: 03/30/2018 - 04/27/2018 (End of cycle = 04/26/18) Event Grade Comments  Fatigue  Grade 2  Moderate, occasionally limiting ADLs  Sensory neuropathy  Grade 2 Moderate symptoms, unchanged from previous  Diarrhea Grade 1 One episode, one stool over baseline/24 hours  Insomnia  Grade 2  Moderate difficulty, takes sleeping pill regularly.  Neutrophil count decreased Grade 2     Non-reportable AEs (grade): - Leukopenia (1) - Moderate pain (2)  Bone pain  Back pain  Headache, right side  Joint pain - knee, wrist  Leg cramps - Migraine headaches, moderate (2) [name] BSN, RN, CCRP 04/27/2018 10:26 AM

## 2018-05-14 ENCOUNTER — Other Ambulatory Visit: Payer: Self-pay

## 2018-05-14 ENCOUNTER — Other Ambulatory Visit: Payer: Self-pay | Admitting: Hematology and Oncology

## 2018-05-14 DIAGNOSIS — C9001 Multiple myeloma in remission: Secondary | ICD-10-CM

## 2018-05-14 MED ORDER — LENALIDOMIDE 5 MG PO CAPS: ORAL_CAPSULE | ORAL | 0 refills | Status: DC

## 2018-05-25 ENCOUNTER — Inpatient Hospital Stay: Payer: Medicare Other | Admitting: *Deleted

## 2018-05-25 ENCOUNTER — Inpatient Hospital Stay: Payer: Medicare Other | Attending: Hematology and Oncology

## 2018-05-25 DIAGNOSIS — C9001 Multiple myeloma in remission: Secondary | ICD-10-CM

## 2018-05-25 DIAGNOSIS — Z006 Encounter for examination for normal comparison and control in clinical research program: Secondary | ICD-10-CM | POA: Diagnosis not present

## 2018-05-25 LAB — CBC WITH DIFFERENTIAL (CANCER CENTER ONLY)
Basophils Absolute: 0 10*3/uL (ref 0.0–0.1)
Basophils Relative: 1 %
Eosinophils Absolute: 0.2 10*3/uL (ref 0.0–0.5)
Eosinophils Relative: 6 %
HCT: 37.2 % (ref 34.8–46.6)
Hemoglobin: 11.7 g/dL (ref 11.6–15.9)
Lymphocytes Relative: 64 %
Lymphs Abs: 2.2 10*3/uL (ref 0.9–3.3)
MCH: 22.9 pg — ABNORMAL LOW (ref 25.1–34.0)
MCHC: 31.4 g/dL — ABNORMAL LOW (ref 31.5–36.0)
MCV: 73 fL — ABNORMAL LOW (ref 79.5–101.0)
Monocytes Absolute: 0.5 10*3/uL (ref 0.1–0.9)
Monocytes Relative: 14 %
Neutro Abs: 0.5 10*3/uL — ABNORMAL LOW (ref 1.5–6.5)
Neutrophils Relative %: 15 %
Platelet Count: 176 10*3/uL (ref 145–400)
RBC: 5.09 MIL/uL (ref 3.70–5.45)
RDW: 15.1 % — ABNORMAL HIGH (ref 11.2–14.5)
WBC Count: 3.4 10*3/uL — ABNORMAL LOW (ref 3.9–10.3)

## 2018-05-25 NOTE — Progress Notes (Signed)
05/25/2018 Patient in to clinic today for hematology evaluation prior to beginning maintenance treatment Cycle 45. Other than her usual aches and pains, fatigue and insomnia, she reported the following symptoms:  - Hoarseness x 3 days (resolved) after returning from a trip to [place] the week before last.  - Cold symptoms x 2 days - nasal congestion, no drainage or fever, and no productive cough. She has been taking Nyquil at bedtime for this. Patient returned her completed medication calendar for Cycle 44 showing no missed doses. Patient was given a blank appointment calendar with Cycle 45 dose days marked, with instructions to wait for results of today's lab work before beginning the next cycle of Revlimid. Following patient's departure from the clinic, results of today's CBC were received showing ANC of 500 (grade 3). Patient was contacted by phone and instructed not to begin Revlimid today and to await any further instruction regarding repeat lab work and/or other intervention. Patient voiced understanding. Message sent to Dr. Alvy Bimler regarding the above. [name] BSN, RN, [place] 05/25/2018 8:25 AM  05/25/2018 Spoke with patient by phone after receiving instructions from Dr. Alvy Bimler. Per MD, antibiotics are not needed at this time, but patient to call for any worsening of her symptoms, including sinus drainage, productive cough or fever. She is to follow neutropenic precautions with focus on avoiding crowds or people who are sick, avoiding buffets/salad bars and unwashed fresh produce, frequent handwashing etc. Patient will return to clinic in one week for recheck to see if [place] is high enough for treatment to be resumed, and current July appointments will be rescheduled accordingly. Patient verbalized understanding. [name] BSN, RN, [place] 05/25/2018 10:43 AM

## 2018-05-26 ENCOUNTER — Telehealth: Payer: Self-pay | Admitting: Hematology and Oncology

## 2018-05-26 NOTE — Telephone Encounter (Signed)
Spoke to patient regarding upcoming July appointment updates per 6/25 sch message

## 2018-06-01 ENCOUNTER — Encounter: Payer: Self-pay | Admitting: Hematology and Oncology

## 2018-06-01 ENCOUNTER — Inpatient Hospital Stay: Payer: Medicare Other | Admitting: *Deleted

## 2018-06-01 ENCOUNTER — Inpatient Hospital Stay: Payer: Medicare Other | Attending: Hematology and Oncology

## 2018-06-01 DIAGNOSIS — C9001 Multiple myeloma in remission: Secondary | ICD-10-CM

## 2018-06-01 DIAGNOSIS — Z79899 Other long term (current) drug therapy: Secondary | ICD-10-CM | POA: Diagnosis not present

## 2018-06-01 DIAGNOSIS — G893 Neoplasm related pain (acute) (chronic): Secondary | ICD-10-CM | POA: Diagnosis not present

## 2018-06-01 DIAGNOSIS — R0789 Other chest pain: Secondary | ICD-10-CM | POA: Diagnosis not present

## 2018-06-01 DIAGNOSIS — R197 Diarrhea, unspecified: Secondary | ICD-10-CM | POA: Diagnosis not present

## 2018-06-01 DIAGNOSIS — Z9221 Personal history of antineoplastic chemotherapy: Secondary | ICD-10-CM | POA: Insufficient documentation

## 2018-06-01 DIAGNOSIS — Z006 Encounter for examination for normal comparison and control in clinical research program: Secondary | ICD-10-CM | POA: Diagnosis present

## 2018-06-01 LAB — CBC WITH DIFFERENTIAL (CANCER CENTER ONLY)
Basophils Absolute: 0.1 10*3/uL (ref 0.0–0.1)
Basophils Relative: 2 %
Eosinophils Absolute: 0.1 10*3/uL (ref 0.0–0.5)
Eosinophils Relative: 3 %
HCT: 37.8 % (ref 34.8–46.6)
Hemoglobin: 11.8 g/dL (ref 11.6–15.9)
Lymphocytes Relative: 41 %
Lymphs Abs: 1.7 10*3/uL (ref 0.9–3.3)
MCH: 23.3 pg — ABNORMAL LOW (ref 25.1–34.0)
MCHC: 31.2 g/dL — ABNORMAL LOW (ref 31.5–36.0)
MCV: 74.6 fL — ABNORMAL LOW (ref 79.5–101.0)
Monocytes Absolute: 0.5 10*3/uL (ref 0.1–0.9)
Monocytes Relative: 13 %
Neutro Abs: 1.6 10*3/uL (ref 1.5–6.5)
Neutrophils Relative %: 41 %
Platelet Count: 196 10*3/uL (ref 145–400)
RBC: 5.07 MIL/uL (ref 3.70–5.45)
RDW: 15.3 % — ABNORMAL HIGH (ref 11.2–14.5)
WBC Count: 4 10*3/uL (ref 3.9–10.3)

## 2018-06-01 MED ORDER — LENALIDOMIDE 5 MG PO CAPS CTSU E1A11: ORAL_CAPSULE | ORAL | 0 refills | Status: DC

## 2018-06-01 NOTE — Progress Notes (Signed)
06/01/2018 Patient in to clinic today for re-evaluation prior to beginning Maintenance treatment Cycle 45. Patient reports that her URI symptoms/nasal congestion have resolved, and she was able to go swimming yesterday. Patient was given an updated calendar to begin Revlimid dosing today, provided lab results show that patient meets criteria for re-treatment. Patient requested that she be contacted at home regarding her lab results. Patient is aware of upcoming appointments for later this month that have been rescheduled due to the treatment delay.  Following patient's departure, lab results were received showing normal ANC of 1600. Called patient's home phone and left a message with her husband, Arnell Sieving, letting him know that results were acceptable today for continued treatment. He will notify the patient upon her return home that she can begin dosing with Revlimid today as prescribed. [name] BSN, RN, [place] 06/01/2018 8:56 AM   Adverse Event Log Study/Protocol: [facility] ECOG E1A11 Maintenance Cycle 44: 04/27/2018 -  06/01/2018 (End of cycle = 05/31/18 - extended due to treatment delay) Event Grade Comments  Fatigue  Grade 2  Moderate, occasionally limiting ADLs  Sensory neuropathy  Grade 2 Moderate symptoms, unchanged from previous  Diarrhea Grade 1 One day, fewer than 3 stools over baseline/24 hours  Insomnia  Grade 2  Moderate difficulty, takes sleeping pill regularly.  Neutrophil count decreased Grade 3     Non-reportable AEs (grade): - Leukopenia (1) - Moderate pain (2)  Bone pain  Back pain  Headache  Knee pain - Migraine headaches, moderate (2) - Arm pain, bilateral, moderate (2) - Dizziness, mild symptoms not impacting ADLs - Hoarseness - mild, resolved without treatment (1) - Itching, mild symptoms x 2 days, no treatment (1) - Upper respiratory infections - treated with Nyquil, symptoms resolved [name] BSN, RN, [facility] 06/04/2018 3:43 PM

## 2018-06-04 ENCOUNTER — Other Ambulatory Visit: Payer: Self-pay | Admitting: Hematology and Oncology

## 2018-06-04 DIAGNOSIS — C9001 Multiple myeloma in remission: Secondary | ICD-10-CM

## 2018-06-04 NOTE — Telephone Encounter (Signed)
Pls refill electronically °

## 2018-06-15 ENCOUNTER — Other Ambulatory Visit: Payer: Medicare Other

## 2018-06-22 ENCOUNTER — Other Ambulatory Visit: Payer: Self-pay | Admitting: *Deleted

## 2018-06-22 ENCOUNTER — Inpatient Hospital Stay: Payer: Medicare Other

## 2018-06-22 ENCOUNTER — Telehealth: Payer: Self-pay | Admitting: Hematology and Oncology

## 2018-06-22 ENCOUNTER — Ambulatory Visit: Payer: Medicare Other | Admitting: Hematology and Oncology

## 2018-06-22 DIAGNOSIS — C9001 Multiple myeloma in remission: Secondary | ICD-10-CM | POA: Diagnosis not present

## 2018-06-22 LAB — CBC WITH DIFFERENTIAL (CANCER CENTER ONLY)
Basophils Absolute: 0 10*3/uL (ref 0.0–0.1)
Basophils Relative: 1 %
Eosinophils Absolute: 0.1 10*3/uL (ref 0.0–0.5)
Eosinophils Relative: 4 %
HCT: 38.2 % (ref 34.8–46.6)
Hemoglobin: 11.9 g/dL (ref 11.6–15.9)
Lymphocytes Relative: 52 %
Lymphs Abs: 1.6 10*3/uL (ref 0.9–3.3)
MCH: 22.9 pg — ABNORMAL LOW (ref 25.1–34.0)
MCHC: 31.1 g/dL — ABNORMAL LOW (ref 31.5–36.0)
MCV: 73.6 fL — ABNORMAL LOW (ref 79.5–101.0)
Monocytes Absolute: 0.4 10*3/uL (ref 0.1–0.9)
Monocytes Relative: 12 %
Neutro Abs: 0.9 10*3/uL — ABNORMAL LOW (ref 1.5–6.5)
Neutrophils Relative %: 31 %
Platelet Count: 163 10*3/uL (ref 145–400)
RBC: 5.19 MIL/uL (ref 3.70–5.45)
RDW: 15 % — ABNORMAL HIGH (ref 11.2–14.5)
WBC Count: 3.1 10*3/uL — ABNORMAL LOW (ref 3.9–10.3)

## 2018-06-22 LAB — CMP (CANCER CENTER ONLY)
ALT: 33 U/L (ref 0–44)
AST: 24 U/L (ref 15–41)
Albumin: 3.8 g/dL (ref 3.5–5.0)
Alkaline Phosphatase: 54 U/L (ref 38–126)
Anion gap: 7 (ref 5–15)
BUN: 11 mg/dL (ref 8–23)
CO2: 27 mmol/L (ref 22–32)
Calcium: 9.4 mg/dL (ref 8.9–10.3)
Chloride: 107 mmol/L (ref 98–111)
Creatinine: 0.85 mg/dL (ref 0.44–1.00)
GFR, Est AFR Am: >60 mL/min (ref >60)
GFR, Estimated: >60 mL/min (ref >60)
Glucose, Bld: 86 mg/dL (ref 70–99)
Potassium: 4 mmol/L (ref 3.5–5.1)
Sodium: 141 mmol/L (ref 135–145)
Total Bilirubin: 0.5 mg/dL (ref 0.3–1.2)
Total Protein: 7.5 g/dL (ref 6.5–8.1)

## 2018-06-22 LAB — LACTATE DEHYDROGENASE: LDH: 179 U/L (ref 98–192)

## 2018-06-22 LAB — TSH: TSH: 1.001 u[IU]/mL (ref 0.308–3.960)

## 2018-06-22 NOTE — Telephone Encounter (Signed)
Added lab per 7/23 sch message - pt is aware.

## 2018-06-23 LAB — KAPPA/LAMBDA LIGHT CHAINS
Kappa free light chain: 24.7 mg/L — ABNORMAL HIGH (ref 3.3–19.4)
Kappa, lambda light chain ratio: 1.44 (ref 0.26–1.65)
Lambda free light chains: 17.2 mg/L (ref 5.7–26.3)

## 2018-06-23 LAB — MULTIPLE MYELOMA PANEL, SERUM
Albumin SerPl Elph-Mcnc: 3.2 g/dL (ref 2.9–4.4)
Albumin/Glob SerPl: 0.9 (ref 0.7–1.7)
Alpha 1: 0.2 g/dL (ref 0.0–0.4)
Alpha2 Glob SerPl Elph-Mcnc: 0.6 g/dL (ref 0.4–1.0)
B-Globulin SerPl Elph-Mcnc: 1.1 g/dL (ref 0.7–1.3)
Gamma Glob SerPl Elph-Mcnc: 1.8 g/dL (ref 0.4–1.8)
Globulin, Total: 3.6 g/dL (ref 2.2–3.9)
IgA: 134 mg/dL (ref 87–352)
IgG (Immunoglobin G), Serum: 1692 mg/dL — ABNORMAL HIGH (ref 700–1600)
IgM (Immunoglobulin M), Srm: 74 mg/dL (ref 26–217)
Total Protein ELP: 6.8 g/dL (ref 6.0–8.5)

## 2018-06-25 ENCOUNTER — Encounter: Payer: Self-pay | Admitting: *Deleted

## 2018-06-29 ENCOUNTER — Telehealth: Payer: Self-pay | Admitting: Hematology and Oncology

## 2018-06-29 ENCOUNTER — Encounter: Payer: Self-pay | Admitting: Hematology and Oncology

## 2018-06-29 ENCOUNTER — Inpatient Hospital Stay: Payer: Medicare Other

## 2018-06-29 ENCOUNTER — Inpatient Hospital Stay: Payer: Medicare Other | Admitting: *Deleted

## 2018-06-29 ENCOUNTER — Inpatient Hospital Stay (HOSPITAL_BASED_OUTPATIENT_CLINIC_OR_DEPARTMENT_OTHER): Payer: Medicare Other | Admitting: Hematology and Oncology

## 2018-06-29 VITALS — BP 139/79 | HR 64 | Temp 98.1°F | Resp 18 | Ht 67.0 in | Wt 171.7 lb

## 2018-06-29 DIAGNOSIS — R0789 Other chest pain: Secondary | ICD-10-CM | POA: Diagnosis not present

## 2018-06-29 DIAGNOSIS — G893 Neoplasm related pain (acute) (chronic): Secondary | ICD-10-CM | POA: Diagnosis not present

## 2018-06-29 DIAGNOSIS — R197 Diarrhea, unspecified: Secondary | ICD-10-CM | POA: Diagnosis not present

## 2018-06-29 DIAGNOSIS — C9001 Multiple myeloma in remission: Secondary | ICD-10-CM

## 2018-06-29 DIAGNOSIS — Z79899 Other long term (current) drug therapy: Secondary | ICD-10-CM

## 2018-06-29 DIAGNOSIS — Z9221 Personal history of antineoplastic chemotherapy: Secondary | ICD-10-CM

## 2018-06-29 DIAGNOSIS — Z006 Encounter for examination for normal comparison and control in clinical research program: Secondary | ICD-10-CM

## 2018-06-29 LAB — CBC WITH DIFFERENTIAL (CANCER CENTER ONLY)
Basophils Absolute: 0 10*3/uL (ref 0.0–0.1)
Basophils Relative: 1 %
Eosinophils Absolute: 0.1 10*3/uL (ref 0.0–0.5)
Eosinophils Relative: 2 %
HCT: 37.4 % (ref 34.8–46.6)
Hemoglobin: 11.6 g/dL (ref 11.6–15.9)
Lymphocytes Relative: 56 %
Lymphs Abs: 2.3 10*3/uL (ref 0.9–3.3)
MCH: 23.1 pg — ABNORMAL LOW (ref 25.1–34.0)
MCHC: 31 g/dL — ABNORMAL LOW (ref 31.5–36.0)
MCV: 74.5 fL — ABNORMAL LOW (ref 79.5–101.0)
Monocytes Absolute: 0.5 10*3/uL (ref 0.1–0.9)
Monocytes Relative: 12 %
Neutro Abs: 1.1 10*3/uL — ABNORMAL LOW (ref 1.5–6.5)
Neutrophils Relative %: 29 %
Platelet Count: 187 10*3/uL (ref 145–400)
RBC: 5.02 MIL/uL (ref 3.70–5.45)
RDW: 15.3 % — ABNORMAL HIGH (ref 11.2–14.5)
WBC Count: 4 10*3/uL (ref 3.9–10.3)

## 2018-06-29 MED ORDER — LENALIDOMIDE 5 MG PO CAPS CTSU E1A11: ORAL_CAPSULE | ORAL | 0 refills | Status: DC

## 2018-06-29 MED ORDER — OXYCODONE HCL 15 MG PO TABS: 15.0000 mg | ORAL_TABLET | Freq: Three times a day (TID) | ORAL | 0 refills | Status: DC | PRN

## 2018-06-29 NOTE — Assessment & Plan Note (Signed)
She had recent recurrence of diarrhea likely due to medication side effects I do not believe this is due to Revlimid Continue conservative management

## 2018-06-29 NOTE — Assessment & Plan Note (Signed)
I reviewed test results with her  So far, her last bone marrow biopsy and recent blood work confirmed complete remission. The patient is comfortable to remain on Revlimid indefinitely. She will continue treatment per research protocol She will continue calcium with vitamin D supplement along with aspirin for DVT prophylaxis. 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 I will see her back as scheduled I will order a skeletal survey before I see her next visit.

## 2018-06-29 NOTE — Assessment & Plan Note (Signed)
She complained of recent atypical chest pain around June 11, 2018 that started on her back radiating to the left arm It was not related to physical exertion She did not have coexistent symptoms of diaphoresis, shortness of breath or syncopal episode Nevertheless, due to cardiovascular risk factors, I recommend cardiology consultation For now, I do not believe it is an issue for her to continue her swimming regimen as tolerated

## 2018-06-29 NOTE — Progress Notes (Signed)
[place] OFFICE PROGRESS NOTE  Patient Care Team: Wenda Low, MD as PCP - General (Internal Medicine) Heath Lark, MD as Consulting Physician (Hematology and Oncology) Benson Norway, RN as Registered Nurse (Oncology)  ASSESSMENT & PLAN:  Multiple myeloma in remission [facility]) I reviewed test results with her  So far, her last bone marrow biopsy and recent blood work confirmed complete remission. The patient is comfortable to remain on Revlimid indefinitely. She will continue treatment per research protocol She will continue calcium with vitamin D supplement along with aspirin for DVT prophylaxis. 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 I will see her back as scheduled I will order a skeletal survey before I see her next visit.  Diarrhea She had recent recurrence of diarrhea likely due to medication side effects I do not believe this is due to Revlimid Continue conservative management  Cancer associated pain She have chronic bone pain which started with a diagnosis of multiple myeloma. We discussed chronic pain management. MRI did suggest she had evidence of degenerative disc disease and spinal stenosis. After much discussion, the patient is comfortable to remain on chronic narcotic prescription. I refilled her prescriptions We discussed narcotic refill policy  Atypical chest pain She complained of recent atypical chest pain around June 11, 2018 that started on her back radiating to the left arm It was not related to physical exertion She did not have coexistent symptoms of diaphoresis, shortness of breath or syncopal episode Nevertheless, due to cardiovascular risk factors, I recommend cardiology consultation For now, I do not believe it is an issue for her to continue her swimming regimen as tolerated   Orders Placed This Encounter  Procedures  . DG Bone Survey Met    Standing Status:   Future    Standing Expiration  Date:   08/29/2019    Order Specific Question:   Reason for Exam (SYMPTOM  OR DIAGNOSIS REQUIRED)    Answer:   myeloma staging    Order Specific Question:   Preferred imaging location?    Answer:   [facility]  . Ambulatory referral to Cardiology    Referral Priority:   Routine    Referral Type:   Consultation    Referral Reason:   Specialty Services Required    Requested Specialty:   Cardiology    Number of Visits Requested:   1    INTERVAL HISTORY: Please see below for problem oriented charting. She returns with her husband for further follow-up She had recent root canal procedure around June 15, 2018 Around June 10, 2018, she complained of episode of back pain and arm pain On 06/11/2018, she described atypical pain radiating to the left arm that resolved spontaneously It was not associated with shortness of breath, diaphoresis or syncopal episode She continues to swim at least a mile 3 times a week without difficulties Her chronic pain is stable with current prescription pain medicine She was started on metformin recently and developed some recent onset of loose stool/diarrhea  SUMMARY OF ONCOLOGIC HISTORY: Oncology History   ISS stage 1 IgG lambda subtype (serum albumin 3.6, Beta2 microglobulin 2.32) Durie Salmon Stage 1     Multiple myeloma in remission ([place])   10/10/2013 Imaging    Skeletal survery was negative      11/09/2013 Bone Marrow Biopsy    BM biopsy confirmed myeloma, 76% involved, IgG lambda subtype      12/06/2013 - 08/29/2014 Chemotherapy  Sh received chemo with revlimid, Velcade, Dexamethasone and Zometa. Patient particpated in clinical research [facility] [phone]      02/23/2014 Bone Marrow Biopsy    Repeat bone marrow biopsy showed 5% involvement      03/31/2014 Adverse Reaction    Zometa was discontinued due to osteonecrosis of the jaw.      05/05/2014 Imaging    Imaging study of the neck showed no explanation that could cause right neck pain. She is  noted to have incidental left upper lung nodule. Plan to repeat imaging study in 3 months.      09/06/2014 Imaging    Bone survey showed no evidence of fracture      09/14/2014 Bone Marrow Biopsy    Bone marrow biopsy showed 8% residual plasma cells by manual count but none on the biopsy specimen      09/26/2014 -  Chemotherapy    She is started on cycle 1 of maintenance Revlimid      09/26/2014 -  Chemotherapy    The patient had [No matching medication found in this treatment plan]  for chemotherapy treatment.       01/31/2015 Imaging     chest x-ray showed pneumonia. Treatment was placed on hold.      05/03/2015 Bone Marrow Biopsy    Accession: [id number] repeat bone marrow aspirate and biopsy show 5% residual plasma cells      10/14/2016 Bone Marrow Biopsy    Bone marrow biopsy showed the plasma cells represent 4% of all cells with lack of large aggregates or sheets. To assess the plasma cell clonality, immunohistochemical stains is performed and it lack clonality. Normal cytogenetics and FISH      01/09/2017 Imaging    CT chest showed ground-glass 1.5 cm apical left upper lobe pulmonary nodule. Initial follow-up with CT at 6-12 months is recommended to confirm persistence. If persistent, repeat CT is recommended every 2 years until 5 years of stability has been established. This recommendation follows the consensus statement: Guidelines for Management of Incidental Pulmonary Nodules Detected on CT Images: From the Fleischner Society 2017; Radiology 2017; 284:228-243. 2. Mild patchy ground-glass opacities in the right upper lobe, probably inflammatory, which can also be reassessed on follow-up chest CT performed for the above dominant ground-glass nodule. 3. Solitary 3 mm right lower lobe solid pulmonary nodule, which can also be reassessed on follow-up chest CT performed for the above dominant ground-glass nodule. 4. No thoracic adenopathy. 5. Aortic atherosclerosis. Two-vessel  coronary atherosclerosis.      06/30/2017 Imaging    1. No interval change in the 11 x 13 mm ground-glass nodule left upper lobe. Given the nearly 6 months of imaging stability, repeat CT is recommended every 2 years until 5 years of stability has been established. This recommendation follows the consensus statement: Guidelines for Management of Incidental Pulmonary Nodules Detected on CT Images: From the Fleischner Society 2017; Radiology 2017; 284:228-243. 2. Interval resolution of the tiny patchy ground-glass nodules right upper lobe, likely infectious/inflammatory etiology. 3. Stable 3 mm right lower lobe pulmonary nodule. Attention on follow-up recommended. 4. Aortic Atherosclerois (ICD10-170.0)      09/29/2017 Imaging    Skeletal survey 1. Questionable new tiny lucency noted the posterior portion of C3 vertebral body. This may represent a small lytic lesion.  2. No definite thoracic lesion noted on today's exam. Stable lucencies in the left ilium and acetabulum.  3. No other focal abnormalities identified. The left hip is unremarkable .  REVIEW OF SYSTEMS:   Constitutional: Denies fevers, chills or abnormal weight loss Eyes: Denies blurriness of vision Ears, nose, mouth, throat, and face: Denies mucositis or sore throat Respiratory: Denies cough, dyspnea or wheezes Skin: Denies abnormal skin rashes Lymphatics: Denies new lymphadenopathy or easy bruising Neurological:Denies numbness, tingling or new weaknesses Behavioral/Psych: Mood is stable, no new changes  All other systems were reviewed with the patient and are negative.  I have reviewed the past medical history, past surgical history, social history and family history with the patient and they are unchanged from previous note.  ALLERGIES:  has No Known Allergies.  MEDICATIONS:  Current Outpatient Medications  Medication Sig Dispense Refill  . metFORMIN (GLUCOPHAGE-XR) 500 MG 24 hr tablet Take 500 mg by mouth  daily.  0  . aspirin 325 MG tablet Take 1 tablet (325 mg total) by mouth daily.    . Cholecalciferol (VITAMIN D3) 2000 UNITS TABS Take 2,000 Units by mouth daily.    . hydrochlorothiazide (HYDRODIURIL) 25 MG tablet Take 25 mg by mouth every evening.     Marland Kitchen lenalidomide (REVLIMID) 5 MG capsule Take 1 capsule every other day for 21 days, off 7 days. Repeat every 28 days. 11 capsule 0  . lenalidomide (REVLIMID) 5 MG capsule TAKE 1 CAPSULE BY MOUTH EVERY OTHER DAY FOR 21 DAYS, OFF 7 DAYS. REPEAT EVERY 28 DAYS 11 capsule 0  . levothyroxine (SYNTHROID, LEVOTHROID) 75 MCG tablet Take 75 mcg by mouth daily before breakfast.    . methocarbamol (ROBAXIN) 500 MG tablet Take 1 tablet (500 mg total) by mouth every 8 (eight) hours as needed for muscle spasms. 90 tablet 1  . Multiple Vitamins-Minerals (CENTRUM SILVER PO) Take 1 tablet by mouth daily.     . ONGLYZA 5 MG TABS tablet Take 5 mg by mouth daily.     Marland Kitchen oxyCODONE (ROXICODONE) 15 MG immediate release tablet Take 1 tablet (15 mg total) by mouth 3 (three) times daily as needed for pain. 90 tablet 0  . polyethylene glycol (MIRALAX / GLYCOLAX) packet Take 17 g by mouth daily as needed for mild constipation.     . RESTASIS 0.05 % ophthalmic emulsion PLACE 1 DROP IN BOTH EYES IN THE EVENING  4  . traMADol (ULTRAM) 50 MG tablet Take 1 tablet (50 mg total) by mouth every 6 (six) hours as needed for moderate pain. (Patient not taking: Reported on 09/19/2017) 90 tablet 0  . traZODone (DESYREL) 50 MG tablet TAKE 1 TABLET (50 MG TOTAL) BY MOUTH AT BEDTIME. 90 tablet 3  . venlafaxine XR (EFFEXOR-XR) 150 MG 24 hr capsule TAKE 1 CAPSULE (150 MG TOTAL) BY MOUTH DAILY WITH BREAKFAST. 90 capsule 1  . verapamil (VERELAN PM) 120 MG 24 hr capsule Take 1 capsule by mouth  daily 30 capsule 11  . vitamin E (VITAMIN E) 1000 UNIT capsule Take 400 Units by mouth daily.      No current facility-administered medications for this visit.    Facility-Administered Medications Ordered  in Other Visits  Medication Dose Route Frequency Provider Last Rate Last Dose  . gadopentetate dimeglumine (MAGNEVIST) injection 15 mL  15 mL Intravenous Once PRN Melvenia Beam, MD        PHYSICAL EXAMINATION: ECOG PERFORMANCE STATUS: 1 - Symptomatic but completely ambulatory  Vitals:   06/29/18 0814  BP: 139/79  Pulse: 64  Resp: 18  Temp: 98.1 F (36.7 C)  SpO2: 100%   Filed Weights   06/29/18 0814  Weight: 171 lb 11.2  oz (77.9 kg)    GENERAL:alert, no distress and comfortable SKIN: skin color, texture, turgor are normal, no rashes or significant lesions EYES: normal, Conjunctiva are pink and non-injected, sclera clear OROPHARYNX:no exudate, no erythema and lips, buccal mucosa, and tongue normal  NECK: supple, thyroid normal size, non-tender, without nodularity LYMPH:  no palpable lymphadenopathy in the cervical, axillary or inguinal LUNGS: clear to auscultation and percussion with normal breathing effort HEART: regular rate & rhythm and no murmurs and no lower extremity edema ABDOMEN:abdomen soft, non-tender and normal bowel sounds Musculoskeletal:no cyanosis of digits and no clubbing  NEURO: alert & oriented x 3 with fluent speech, no focal motor/sensory deficits  LABORATORY DATA:  I have reviewed the data as listed    Component Value Date/Time   NA 141 06/22/2018 0749   NA 141 09/29/2017 0830   K 4.0 06/22/2018 0749   K 3.6 09/29/2017 0830   CL 107 06/22/2018 0749   CO2 27 06/22/2018 0749   CO2 29 09/29/2017 0830   GLUCOSE 86 06/22/2018 0749   GLUCOSE 87 09/29/2017 0830   BUN 11 06/22/2018 0749   BUN 14.0 09/29/2017 0830   CREATININE 0.85 06/22/2018 0749   CREATININE 0.9 09/29/2017 0830   CALCIUM 9.4 06/22/2018 0749   CALCIUM 9.6 09/29/2017 0830   PROT 7.5 06/22/2018 0749   PROT 7.3 09/29/2017 0832   PROT 8.0 09/29/2017 0830   ALBUMIN 3.8 06/22/2018 0749   ALBUMIN 3.8 09/29/2017 0830   AST 24 06/22/2018 0749   AST 21 09/29/2017 0830   ALT 33  06/22/2018 0749   ALT 69 (H) 09/29/2017 0830   ALKPHOS 54 06/22/2018 0749   ALKPHOS 60 09/29/2017 0830   BILITOT 0.5 06/22/2018 0749   BILITOT 0.46 09/29/2017 0830   GFRNONAA >60 06/22/2018 0749   GFRAA >60 06/22/2018 0749    No results found for: SPEP, UPEP  Lab Results  Component Value Date   WBC 4.0 06/29/2018   NEUTROABS 1.1 (L) 06/29/2018   HGB 11.6 06/29/2018   HCT 37.4 06/29/2018   MCV 74.5 (L) 06/29/2018   PLT 187 06/29/2018      Chemistry      Component Value Date/Time   NA 141 06/22/2018 0749   NA 141 09/29/2017 0830   K 4.0 06/22/2018 0749   K 3.6 09/29/2017 0830   CL 107 06/22/2018 0749   CO2 27 06/22/2018 0749   CO2 29 09/29/2017 0830   BUN 11 06/22/2018 0749   BUN 14.0 09/29/2017 0830   CREATININE 0.85 06/22/2018 0749   CREATININE 0.9 09/29/2017 0830      Component Value Date/Time   CALCIUM 9.4 06/22/2018 0749   CALCIUM 9.6 09/29/2017 0830   ALKPHOS 54 06/22/2018 0749   ALKPHOS 60 09/29/2017 0830   AST 24 06/22/2018 0749   AST 21 09/29/2017 0830   ALT 33 06/22/2018 0749   ALT 69 (H) 09/29/2017 0830   BILITOT 0.5 06/22/2018 0749   BILITOT 0.46 09/29/2017 0830      All questions were answered. The patient knows to call the clinic with any problems, questions or concerns. No barriers to learning was detected.  I spent 15 minutes counseling the patient face to face. The total time spent in the appointment was 20 minutes and more than 50% was on counseling and review of test results  Heath Lark, MD 06/29/2018 8:59 AM

## 2018-06-29 NOTE — Assessment & Plan Note (Signed)
She have chronic bone pain which started with a diagnosis of multiple myeloma. We discussed chronic pain management. MRI did suggest she had evidence of degenerative disc disease and spinal stenosis. After much discussion, the patient is comfortable to remain on chronic narcotic prescription. I refilled her prescriptions We discussed narcotic refill policy 

## 2018-06-29 NOTE — Telephone Encounter (Signed)
Gave patient avs and calendar.   °

## 2018-07-06 ENCOUNTER — Ambulatory Visit: Payer: Medicare Other | Admitting: Cardiovascular Disease

## 2018-07-06 ENCOUNTER — Encounter: Payer: Self-pay | Admitting: Cardiovascular Disease

## 2018-07-06 VITALS — BP 120/70 | HR 64 | Ht 67.0 in | Wt 171.0 lb

## 2018-07-06 DIAGNOSIS — I200 Unstable angina: Secondary | ICD-10-CM | POA: Diagnosis not present

## 2018-07-06 DIAGNOSIS — C9001 Multiple myeloma in remission: Secondary | ICD-10-CM | POA: Diagnosis not present

## 2018-07-06 DIAGNOSIS — I10 Essential (primary) hypertension: Secondary | ICD-10-CM | POA: Insufficient documentation

## 2018-07-06 MED ORDER — ASPIRIN 81 MG PO TABS: 81.0000 mg | ORAL_TABLET | Freq: Every day | ORAL | Status: DC

## 2018-07-06 NOTE — Patient Instructions (Signed)
Medication Instructions:  Your physician has recommended you make the following change in your medication:  1) DECREASE Aspirin to 81 mg tablet by mouth ONCE daily   Labwork: none  Testing/Procedures: Your physician has requested that you have a lexiscan myoview. For further information please visit [email or web address]. Please follow instruction sheet, as given.    Follow-Up: Follow up with Dr. Gwenlyn Found as needed.   Any Other Special Instructions Will Be Listed Below (If Applicable).     If you need a refill on your cardiac medications before your next appointment, please call your pharmacy.

## 2018-07-06 NOTE — Progress Notes (Signed)
06/29/2018 Patient in to clinic accompanied by her husband, Carla Perry, today for evaluation prior to beginning treatment Cycle 46. Based on lab results review and history and physical by Dr. Alvy Bimler, patient meets criteria for continued treatment with Revlimid. Patient returned completed cycle 45 Medication Calendar, confirming dosing at dose level -3, Revlimid 5mg  every other day, Days 1-21, with no missed doses. Patient given printed appointment calendar with Cycle 46 treatment days marked, to document doses for the next treatment cycle, which will begin today. [name] BSN, RN, [place] 06/29/2018 4:08 PM  Adverse Event Log Study/Protocol: [facility] ECOG [id number] Maintenance Cycles 43-45: 03/30/2018 - 06/29/2018 (end of Cycle 45= 06/28/18) Event Grade Attribution to lenalidomide Cycle # Comments  Fatigue  Grade 2  Probable 43-45 Moderate, occasionally limiting ADLs (unchanged from previous)  Sensory neuropathy  Grade 2 Unlikely 43-45 Moderate symptoms, unchanged from previous  Insomnia  Grade 2  Unlikely 43-45 Moderate difficulty, takes sleeping pill regularly, unchanged  Neutrophil count decreased Grade 2  Definite 43,45    Neutrophil count decreased Grade 3 Definite 44,45   Diarrhea Grade 1 Probable 43-45 Isolated episodes in Cycles 43 and 44; several days in Cycle 45 related to switch to Metformin.  Non-reportable AEs - unsolicited, < Grade 3 (grade): - Headache rt. side/migraines - moderate (2) - Leukopenia (1) - Moderate pain (2) . Joint pain - knee, ankle, wrist . Back pain . Bone pain . Leg pain - Abdominal pain (1) - Weight loss (1) - Leg cramps (2) - Arm pain, bilateral (2) - Dizziness (1) - Itching (1) - Upper respiratory infection (2) - Hoarseness (1) - Atypical chest pain (2) - Hypertension (1) - Toothache (2) - Generalized pain (2) [name] BSN, RN, [place] 07/09/2018 10:19 AM

## 2018-07-06 NOTE — Progress Notes (Signed)
07/06/2018 Carla Perry   1956/07/07  [id number]  Primary Physician Wenda Low, MD Primary Cardiologist: Lorretta Harp MD Lupe Carney, [place]  HPI:  Carla Perry is a 62 y.o. mildly overweight married African-American female mother of 2, grandmother of one grandchild who is accompanied by her husband Arnell Sieving today.  She was referred by Dr. Alvy Bimler  for cardiovascular evaluation because of chest pain.  She is retired from working at [employer].  Risk factors include treated diabetes and hypertension.  There is no family history.  She is never had a heart attack or stroke.  She does have multiple myeloma however.  She did develop chest pain on 06/11/2018 lasting for hours of left upper extremity radiation but has not had subsequent episodes.   Current Meds  Medication Sig  . aspirin 325 MG tablet Take 1 tablet (325 mg total) by mouth daily.  . Cholecalciferol (VITAMIN D3) 2000 UNITS TABS Take 2,000 Units by mouth daily.  . hydrochlorothiazide (HYDRODIURIL) 25 MG tablet Take 25 mg by mouth every evening.   Marland Kitchen lenalidomide (REVLIMID) 5 MG capsule Take 1 capsule every other day for 21 days, off 7 days. Repeat every 28 days.  Marland Kitchen levothyroxine (SYNTHROID, LEVOTHROID) 75 MCG tablet Take 75 mcg by mouth daily before breakfast.  . metFORMIN (GLUCOPHAGE-XR) 500 MG 24 hr tablet Take 500 mg by mouth daily.  . methocarbamol (ROBAXIN) 500 MG tablet Take 1 tablet (500 mg total) by mouth every 8 (eight) hours as needed for muscle spasms.  . Multiple Vitamins-Minerals (CENTRUM SILVER PO) Take 1 tablet by mouth daily.   . ONGLYZA 5 MG TABS tablet Take 5 mg by mouth daily.   Marland Kitchen oxyCODONE (ROXICODONE) 15 MG immediate release tablet Take 1 tablet (15 mg total) by mouth 3 (three) times daily as needed for pain.  . polyethylene glycol (MIRALAX / GLYCOLAX) packet Take 17 g by mouth daily as needed for mild constipation.   . RESTASIS 0.05 % ophthalmic emulsion PLACE 1 DROP IN BOTH EYES IN THE  EVENING  . traMADol (ULTRAM) 50 MG tablet Take 1 tablet (50 mg total) by mouth every 6 (six) hours as needed for moderate pain.  . traZODone (DESYREL) 50 MG tablet TAKE 1 TABLET (50 MG TOTAL) BY MOUTH AT BEDTIME.  Marland Kitchen venlafaxine XR (EFFEXOR-XR) 150 MG 24 hr capsule TAKE 1 CAPSULE (150 MG TOTAL) BY MOUTH DAILY WITH BREAKFAST.  Marland Kitchen verapamil (VERELAN PM) 120 MG 24 hr capsule Take 1 capsule by mouth  daily  . vitamin E (VITAMIN E) 1000 UNIT capsule Take 400 Units by mouth daily.      No Known Allergies  Social History   Socioeconomic History  . Marital status: Married    Spouse name: Arnell Sieving  . Number of children: 2  . Years of education: 52  . Highest education level: Not on file  Occupational History    Employer: [employer]  . Financial resource strain: Not on file  . Food insecurity:    Worry: Not on file    Inability: Not on file  . Transportation needs:    Medical: Not on file    Non-medical: Not on file  Tobacco Use  . Smoking status: Never Smoker  . Smokeless tobacco: Never Used  Substance and Sexual Activity  . Alcohol use: No    Alcohol/week: 0.0 oz  . Drug use: No  . Sexual activity: Not on file  Lifestyle  . Physical activity:  Days per week: Not on file    Minutes per session: Not on file  . Stress: Not on file  Relationships  . Social connections:    Talks on phone: Not on file    Gets together: Not on file    Attends religious service: Not on file    Active member of club or organization: Not on file    Attends meetings of clubs or organizations: Not on file    Relationship status: Not on file  . Intimate partner violence:    Fear of current or ex partner: Not on file    Emotionally abused: Not on file    Physically abused: Not on file    Forced sexual activity: Not on file  Other Topics Concern  . Not on file  Social History Narrative   Patient lives at home with her husband Special educational needs teacher). Patient has two years college.   Right handed.    Caffeine- None     Review of Systems: General: negative for chills, fever, night sweats or weight changes.  Cardiovascular: negative for chest pain, dyspnea on exertion, edema, orthopnea, palpitations, paroxysmal nocturnal dyspnea or shortness of breath Dermatological: negative for rash Respiratory: negative for cough or wheezing Urologic: negative for hematuria Abdominal: negative for nausea, vomiting, diarrhea, bright red blood per rectum, melena, or hematemesis Neurologic: negative for visual changes, syncope, or dizziness All other systems reviewed and are otherwise negative except as noted above.    Blood pressure 120/70, pulse 64, height 5' 7"  (1.702 m), weight 171 lb (77.6 kg).  General appearance: alert and no distress Neck: no adenopathy, no carotid bruit, no JVD, supple, symmetrical, trachea midline and thyroid not enlarged, symmetric, no tenderness/mass/nodules Lungs: clear to auscultation bilaterally Heart: regular rate and rhythm, S1, S2 normal, no murmur, click, rub or gallop Extremities: extremities normal, atraumatic, no cyanosis or edema Pulses: 2+ and symmetric Skin: Skin color, texture, turgor normal. No rashes or lesions Neurologic: Alert and oriented X 3, normal strength and tone. Normal symmetric reflexes. Normal coordination and gait  EKG sinus rhythm at 64 with nonspecific ST and T wave changes.  I personally reviewed this EKG.  ASSESSMENT AND PLAN:   Essential hypertension History of essential hypertension her blood pressure measured at 120/70.  She is on hydrochlorothiazide and verapamil.  Continue current meds at current dosing.  Atypical chest pain History of atypical chest pain that occurred on 06/11/2018 lasted several hours with left upper extremity radiation.  She is had no subsequent episodes.  Her EKG just does show some nonspecific changes with some anterolateral T wave inversion which is not new.  She cannot exercise because of her multiple myeloma.   I am going to get a pharmacologic Myoview stress test to further evaluate.      Lorretta Harp MD FACP,FACC,FAHA, [facility] 07/06/2018 9:46 AM

## 2018-07-06 NOTE — Assessment & Plan Note (Signed)
History of atypical chest pain that occurred on 06/11/2018 lasted several hours with left upper extremity radiation.  She is had no subsequent episodes.  Her EKG just does show some nonspecific changes with some anterolateral T wave inversion which is not new.  She cannot exercise because of her multiple myeloma.  I am going to get a pharmacologic Myoview stress test to further evaluate.

## 2018-07-06 NOTE — Assessment & Plan Note (Signed)
History of essential hypertension her blood pressure measured at 120/70.  She is on hydrochlorothiazide and verapamil.  Continue current meds at current dosing.

## 2018-07-08 ENCOUNTER — Telehealth (HOSPITAL_COMMUNITY): Payer: Self-pay

## 2018-07-08 NOTE — Telephone Encounter (Signed)
Encounter complete. 

## 2018-07-09 ENCOUNTER — Telehealth: Payer: Self-pay | Admitting: *Deleted

## 2018-07-09 NOTE — Telephone Encounter (Signed)
07/09/2018 After correspondence with Dr. Alvy Bimler and Dr. Gwenlyn Found, contacted patient to notify her that per Dr. Alvy Bimler, she should resume aspirin at 325mg  instead of 81mg . Patient voiced understanding, and will contact cardiology to confirm no contraindication related to next week's testing. [name] BSN, RN, CCRP 07/09/2018 1:20 PM

## 2018-07-13 ENCOUNTER — Ambulatory Visit (HOSPITAL_COMMUNITY)
Admission: RE | Admit: 2018-07-13 | Discharge: 2018-07-13 | Disposition: A | Discharge status: Home / Self Care | Payer: Medicare Other | Source: Ambulatory Visit | Attending: Cardiology | Admitting: Cardiology

## 2018-07-13 DIAGNOSIS — I200 Unstable angina: Secondary | ICD-10-CM | POA: Insufficient documentation

## 2018-07-13 DIAGNOSIS — C9001 Multiple myeloma in remission: Secondary | ICD-10-CM | POA: Insufficient documentation

## 2018-07-13 LAB — MYOCARDIAL PERFUSION IMAGING
LV dias vol: 101 mL (ref 46–106)
LV sys vol: 49 mL
Peak HR: 90 {beats}/min
Rest HR: 55 {beats}/min
SDS: 0
SRS: 2
SSS: 2
TID: 1.1

## 2018-07-13 MED ORDER — TECHNETIUM TC 99M TETROFOSMIN IV KIT
10.5000 | PACK | Freq: Once | INTRAVENOUS | Status: AC | PRN
  Administered 2018-07-13: 10.5 via INTRAVENOUS
  Filled 2018-07-13: qty 11

## 2018-07-13 MED ORDER — REGADENOSON 0.4 MG/5ML IV SOLN
0.4000 mg | Freq: Once | INTRAVENOUS | Status: AC
Start: 2018-07-13 — End: 2018-07-13
  Administered 2018-07-13: 0.4 mg via INTRAVENOUS

## 2018-07-13 MED ORDER — TECHNETIUM TC 99M TETROFOSMIN IV KIT
31.9000 | PACK | Freq: Once | INTRAVENOUS | Status: AC | PRN
  Administered 2018-07-13: 31.9 via INTRAVENOUS
  Filled 2018-07-13: qty 32

## 2018-07-14 ENCOUNTER — Other Ambulatory Visit: Payer: Self-pay

## 2018-07-14 ENCOUNTER — Other Ambulatory Visit: Payer: Self-pay | Admitting: Hematology and Oncology

## 2018-07-14 DIAGNOSIS — C9001 Multiple myeloma in remission: Secondary | ICD-10-CM

## 2018-07-20 ENCOUNTER — Other Ambulatory Visit: Payer: Self-pay | Admitting: *Deleted

## 2018-07-20 DIAGNOSIS — R9439 Abnormal result of other cardiovascular function study: Secondary | ICD-10-CM

## 2018-07-22 ENCOUNTER — Other Ambulatory Visit: Payer: Self-pay

## 2018-07-22 ENCOUNTER — Ambulatory Visit (HOSPITAL_COMMUNITY): Payer: Medicare Other | Attending: Cardiovascular Disease

## 2018-07-22 DIAGNOSIS — R9439 Abnormal result of other cardiovascular function study: Secondary | ICD-10-CM | POA: Insufficient documentation

## 2018-07-22 DIAGNOSIS — I10 Essential (primary) hypertension: Secondary | ICD-10-CM | POA: Insufficient documentation

## 2018-07-22 DIAGNOSIS — R079 Chest pain, unspecified: Secondary | ICD-10-CM | POA: Diagnosis not present

## 2018-07-23 ENCOUNTER — Telehealth: Payer: Self-pay | Admitting: *Deleted

## 2018-07-23 DIAGNOSIS — C9001 Multiple myeloma in remission: Secondary | ICD-10-CM

## 2018-07-23 MED ORDER — LENALIDOMIDE 5 MG PO CAPS: ORAL_CAPSULE | ORAL | 1 refills | Status: DC

## 2018-07-23 MED ORDER — LENALIDOMIDE 5 MG PO CAPS CTSU E1A11: ORAL_CAPSULE | ORAL | 0 refills | Status: DC

## 2018-07-23 NOTE — Telephone Encounter (Signed)
Pt called for a refill of Revlimid. States she is due to restart on Tuesday, 8/27. Refill sent to [facility].

## 2018-07-27 ENCOUNTER — Inpatient Hospital Stay: Payer: Medicare Other | Admitting: *Deleted

## 2018-07-27 ENCOUNTER — Encounter: Payer: Self-pay | Admitting: Hematology and Oncology

## 2018-07-27 ENCOUNTER — Inpatient Hospital Stay: Payer: Medicare Other | Attending: Hematology and Oncology

## 2018-07-27 DIAGNOSIS — C9001 Multiple myeloma in remission: Secondary | ICD-10-CM | POA: Diagnosis not present

## 2018-07-27 DIAGNOSIS — Z006 Encounter for examination for normal comparison and control in clinical research program: Secondary | ICD-10-CM | POA: Diagnosis present

## 2018-07-27 LAB — CBC WITH DIFFERENTIAL (CANCER CENTER ONLY)
Basophils Absolute: 0 10*3/uL (ref 0.0–0.1)
Basophils Relative: 1 %
Eosinophils Absolute: 0.1 10*3/uL (ref 0.0–0.5)
Eosinophils Relative: 3 %
HCT: 36.8 % (ref 34.8–46.6)
Hemoglobin: 11.5 g/dL — ABNORMAL LOW (ref 11.6–15.9)
Lymphocytes Relative: 54 %
Lymphs Abs: 2.2 10*3/uL (ref 0.9–3.3)
MCH: 22.9 pg — ABNORMAL LOW (ref 25.1–34.0)
MCHC: 31.2 g/dL — ABNORMAL LOW (ref 31.5–36.0)
MCV: 73.5 fL — ABNORMAL LOW (ref 79.5–101.0)
Monocytes Absolute: 0.5 10*3/uL (ref 0.1–0.9)
Monocytes Relative: 12 %
Neutro Abs: 1.2 10*3/uL — ABNORMAL LOW (ref 1.5–6.5)
Neutrophils Relative %: 30 %
Platelet Count: 170 10*3/uL (ref 145–400)
RBC: 5.01 MIL/uL (ref 3.70–5.45)
RDW: 14.9 % — ABNORMAL HIGH (ref 11.2–14.5)
WBC Count: 4 10*3/uL (ref 3.9–10.3)

## 2018-07-27 MED ORDER — LENALIDOMIDE 5 MG PO CAPS CTSU E1A11: ORAL_CAPSULE | ORAL | 0 refills | Status: DC

## 2018-07-27 NOTE — Progress Notes (Signed)
07/27/2018  Patient in to clinic unaccompanied today for evaluation prior to beginning treatment Cycle 47. Patient returned completed cycle 46 Medication Calendar, confirming dosing at dose level -3, Revlimid 5mg  every other day, Days 1-21, with no missed doses. Patient given printed appointment calendar with Cycle 47 treatment days marked, to document doses for the next treatment cycle, which will begin today. Patient reports that she has not yet received her Revlimid refill, requested on Friday 07/23/18, however she has two pills from a previous Rx, so that she may begin dosing today if delivery is delayed. She will contact her mail order pharmacy today for an update. Patient reports side effects over the past month including ongoing GI complaints felt to be related to switch to Metformin in July - mild abdominal pain, which has now improved, and ongoing episodes of diarrhea occurring over several days. She reports that she is no longer taking a stool softener, and that she has 2-4 loose stools per day, 2-3 days per week per notes on calendar (grade 1). She reports that she has occasional mild itching, not in any particular area on her body, and not requiring treatment. Otherwise, patient has her usual complaints of pain, with a slight increase in the number of migraine headaches over the past month. Encouraged patient to contact her PCP to report ongoing loose stools for additional recommendations, particularly if symptoms worsen or persist. Also instructed patient to call [facility] office if symptoms do not improve or worsen, noting that she need not wait until the next clinic visit, if she feels intervention is needed for any symptoms. Patient voiced understanding. [name] BSN, RN, [place] 07/27/2018 8:27 AM

## 2018-07-27 NOTE — Progress Notes (Signed)
07/27/2018 Spoke with patient by phone to notify her that neutrophil count is 1200 today and acceptable for continued treatment with lenalidomide. Patient will begin dosing today as planned. [name] BSN, RN, [facility] 07/27/2018 8:52 AM   Patient reported ongoing symptoms as noted in table below. She denies any symptoms of nausea, vomiting, swelling in arms or legs, fever, irritability, muscle weakness, peripheral motor neuropathy or acneiform rash.  Adverse Event Log Study/Protocol: CTSU ECOG E1A11 Maintenance Cycle 46: 06/29/2018 - 07/27/2018 (end of Cycle = 0/10/93) Solicited  Events Grade Comments  Fatigue  Grade 2  Moderate, occasionally limiting ADLs (unchanged from previous)  Sensory neuropathy  Grade 2 Moderate symptoms, but feels may be slightly worse than previous  Insomnia  Grade 2  Moderate difficulty, takes sleeping pill regularly, unchanged  Neutrophil count decreased Grade 2     Diarrhea Grade 1 Multiple days, 1-3 stools over baseline; no longer using stool softener  Shortness of breath Grade 1 With moderate exertion x 1 day  Non-reportable AEs: unsolicited, < Grade 3 (grade) - Headaches/migraines - moderate (2) - Moderate pain (2)  Joint pain - hip, knee  Back pain  Bone pain - Abdominal pain (1) - Left side weakness (1) - Hypertension (1) - T wave abnormality (1) - Left ventricular diastolic dysfunction - Itching (1) [name] BSN, RN, [place] 07/27/2018 1:47 PM

## 2018-08-14 ENCOUNTER — Other Ambulatory Visit: Payer: Self-pay | Admitting: Hematology and Oncology

## 2018-08-14 NOTE — Telephone Encounter (Signed)
-

## 2018-08-24 ENCOUNTER — Inpatient Hospital Stay: Payer: Medicare Other | Admitting: *Deleted

## 2018-08-24 ENCOUNTER — Inpatient Hospital Stay: Payer: Medicare Other | Attending: Hematology and Oncology

## 2018-08-24 DIAGNOSIS — C9001 Multiple myeloma in remission: Secondary | ICD-10-CM

## 2018-08-24 DIAGNOSIS — Z006 Encounter for examination for normal comparison and control in clinical research program: Secondary | ICD-10-CM | POA: Insufficient documentation

## 2018-08-24 LAB — CBC WITH DIFFERENTIAL (CANCER CENTER ONLY)
Basophils Absolute: 0 10*3/uL (ref 0.0–0.1)
Basophils Relative: 0 %
Eosinophils Absolute: 0.1 10*3/uL (ref 0.0–0.5)
Eosinophils Relative: 3 %
HCT: 36.3 % (ref 34.8–46.6)
Hemoglobin: 11.3 g/dL — ABNORMAL LOW (ref 11.6–15.9)
Lymphocytes Relative: 58 %
Lymphs Abs: 1.9 10*3/uL (ref 0.9–3.3)
MCH: 22.9 pg — ABNORMAL LOW (ref 25.1–34.0)
MCHC: 31.1 g/dL — ABNORMAL LOW (ref 31.5–36.0)
MCV: 73.7 fL — ABNORMAL LOW (ref 79.5–101.0)
Monocytes Absolute: 0.4 10*3/uL (ref 0.1–0.9)
Monocytes Relative: 12 %
Neutro Abs: 0.9 10*3/uL — ABNORMAL LOW (ref 1.5–6.5)
Neutrophils Relative %: 27 %
Platelet Count: 181 10*3/uL (ref 145–400)
RBC: 4.93 MIL/uL (ref 3.70–5.45)
RDW: 14.9 % — ABNORMAL HIGH (ref 11.2–14.5)
WBC Count: 3.3 10*3/uL — ABNORMAL LOW (ref 3.9–10.3)

## 2018-08-24 NOTE — Research (Deleted)
08/24/2018 Patient in to clinic unaccompanied today for evaluation prior to beginning treatment Cycle 48. Patient returned completed cycle 47 Medication Calendar, confirming dosing at dose level -3, Revlimid 5mg  every other day, Days 1-21, with no missed doses. Patient given printed appointment calendar with Cycle 48 treatment days marked, to document doses for the next treatment cycle, which will begin today, provided CBC results meet criteria for retreatment. Patient reports side effects over the past month including initial episodes of diarrhea (2-4 loose stools per day - grade 1), felt to be related to Metformin, however these improved within a few days. She reports back pain x 3 days beginning 08/11/18 (worse than usual, interfering with function, but not with self-care), treated with Robaxin. She attributed this back pain to constipation, having discontinued Miralax due to diarrhea. Once she resumed Miralax and constipation resolved, her back pain also improved. She reports no other symptoms other than her usual complaints of fatigue, insomnia, neuropathy and various areas of pain which are unchanged in nature. Patient is aware that she should not resume treatment with lenalidomide today until she has been notified by the office (at home phone #, per patient request). [name] BSN, RN, [place] 08/24/2018 7:48 AM   08/24/2018 Due to [place] of 900, treatment with lenalidomide cannot begin today. Will contact patient by phone to notify her of above, and request return appointment next week to recheck. [name] BSN, RN, [place] 08/24/2018 8:06 AM   08/24/2018 Spoke with patient by phone to inform her that she should not start Revlimid today due to [address] of 900, which must be 1000 for retreatment. Plans made to add lab appointment for recheck next week, as well as moving October appointments by one week. It was also noted that the metastatic bone survey had not yet been scheduled, and will need to be moved by  one week. Patient voiced understanding. [name] BSN, RN, [facility] 08/24/2018 12:36 PM

## 2018-08-25 ENCOUNTER — Telehealth: Payer: Self-pay | Admitting: *Deleted

## 2018-08-25 NOTE — Telephone Encounter (Signed)
Scheduled appt per 9/24 sch message - pt is aware of all appt dates and times.

## 2018-08-27 NOTE — Research (Signed)
08/24/2018 Patient in to clinic unaccompanied today for evaluation prior to beginning treatment Cycle 48. Patient returned completed cycle 47 Medication Calendar, confirming dosing at dose level -3, Revlimid 5mg  every other day, Days 1-21, with no missed doses. Patient given printed appointment calendar with Cycle 48 treatment days marked, to document doses for the next treatment cycle, which will begin today, provided CBC results meet criteria for retreatment. Patient reports side effects over the past month including initial episodes of diarrhea (2-4 loose stools per day - grade 1), felt to be related to Metformin, however these improved within a few days. She reports back pain x 3 days beginning 08/11/18 (worse than usual, interfering with function, but not with self-care), treated with Robaxin. She attributed this back pain to constipation, having discontinued Miralax due to diarrhea. Once she resumed Miralax and constipation resolved, her back pain also improved. She reports no other symptoms other than her usual complaints of fatigue, insomnia, neuropathy and various areas of pain which are unchanged in nature. Patient is aware that she should not resume treatment with lenalidomide today until she has been notified by the office (at home phone #, per patient request). [name] BSN, RN, [place] 08/24/2018 7:48 AM    08/24/2018 Due to [place] of 900, treatment with lenalidomide cannot begin today. Will contact patient by phone to notify her of above, and request return appointment next week to recheck. [name] BSN, RN, [place] 08/24/2018 8:06 AM    08/24/2018 Spoke with patient by phone to inform her that she should not start Revlimid today due to [place] of 900, which must be 1000 for retreatment. Plans made to add lab appointment for recheck next week, as well as moving October appointments by one week. It was also noted that the metastatic bone survey had not yet been scheduled, and will need to be moved  by one week. Patient voiced understanding. [name] BSN, RN, [facility] 08/24/2018 12:36 PM

## 2018-08-27 NOTE — Research (Deleted)
08/24/2018 Patient in to clinic unaccompanied today for evaluation prior to beginning treatment Cycle 48. Patient returned completed cycle 47 Medication Calendar, confirming dosing at dose level -3, Revlimid 5mg  every other day, Days 1-21, with no missed doses. Patient given printed appointment calendar with Cycle 48 treatment days marked, to document doses for the next treatment cycle, which will begin today, provided CBC results meet criteria for retreatment. Patient reports side effects over the past month including initial episodes of diarrhea (2-4 loose stools per day - grade 1), felt to be related to Metformin, however these improved within a few days. She reports back pain x 3 days beginning 08/11/18 (worse than usual, interfering with function, but not with self-care), treated with Robaxin. She attributed this back pain to constipation, having discontinued Miralax due to diarrhea. Once she resumed Miralax and constipation resolved, her back pain also improved. She reports no other symptoms other than her usual complaints of fatigue, insomnia, neuropathy and various areas of pain which are unchanged in nature. Patient is aware that she should not resume treatment with lenalidomide today until she has been notified by the office (at home phone #, per patient request). [name] BSN, RN, [place] 08/24/2018 7:48 AM    08/24/2018 Due to [place] of 900, treatment with lenalidomide cannot begin today. Will contact patient by phone to notify her of above, and request return appointment next week to recheck. [name] BSN, RN, [place] 08/24/2018 8:06 AM    08/24/2018 Spoke with patient by phone to inform her that she should not start Revlimid today due to [place] of 900, which must be 1000 for retreatment. Plans made to add lab appointment for recheck next week, as well as moving October appointments by one week. It was also noted that the metastatic bone survey had not yet been scheduled, and will need to be moved  by one week. Patient voiced understanding. [name] BSN, RN, [facility] 08/24/2018 12:36 PM

## 2018-08-31 ENCOUNTER — Encounter: Payer: Self-pay | Admitting: Hematology and Oncology

## 2018-08-31 ENCOUNTER — Inpatient Hospital Stay: Payer: Medicare Other | Admitting: *Deleted

## 2018-08-31 ENCOUNTER — Inpatient Hospital Stay: Payer: Medicare Other | Attending: Hematology and Oncology

## 2018-08-31 DIAGNOSIS — C9001 Multiple myeloma in remission: Secondary | ICD-10-CM

## 2018-08-31 DIAGNOSIS — Z79899 Other long term (current) drug therapy: Secondary | ICD-10-CM | POA: Insufficient documentation

## 2018-08-31 DIAGNOSIS — M898X9 Other specified disorders of bone, unspecified site: Secondary | ICD-10-CM | POA: Insufficient documentation

## 2018-08-31 DIAGNOSIS — G8929 Other chronic pain: Secondary | ICD-10-CM | POA: Diagnosis not present

## 2018-08-31 DIAGNOSIS — Z006 Encounter for examination for normal comparison and control in clinical research program: Secondary | ICD-10-CM | POA: Diagnosis present

## 2018-08-31 DIAGNOSIS — Z79891 Long term (current) use of opiate analgesic: Secondary | ICD-10-CM | POA: Insufficient documentation

## 2018-08-31 DIAGNOSIS — D61811 Other drug-induced pancytopenia: Secondary | ICD-10-CM | POA: Diagnosis not present

## 2018-08-31 LAB — CBC WITH DIFFERENTIAL (CANCER CENTER ONLY)
Basophils Absolute: 0 10*3/uL (ref 0.0–0.1)
Basophils Relative: 1 %
Eosinophils Absolute: 0.1 10*3/uL (ref 0.0–0.5)
Eosinophils Relative: 2 %
HCT: 36.5 % (ref 34.8–46.6)
Hemoglobin: 11.4 g/dL — ABNORMAL LOW (ref 11.6–15.9)
Lymphocytes Relative: 60 %
Lymphs Abs: 2.2 10*3/uL (ref 0.9–3.3)
MCH: 22.8 pg — ABNORMAL LOW (ref 25.1–34.0)
MCHC: 31.1 g/dL — ABNORMAL LOW (ref 31.5–36.0)
MCV: 73.5 fL — ABNORMAL LOW (ref 79.5–101.0)
Monocytes Absolute: 0.3 10*3/uL (ref 0.1–0.9)
Monocytes Relative: 9 %
Neutro Abs: 1 10*3/uL — ABNORMAL LOW (ref 1.5–6.5)
Neutrophils Relative %: 28 %
Platelet Count: 195 10*3/uL (ref 145–400)
RBC: 4.97 MIL/uL (ref 3.70–5.45)
RDW: 14.9 % — ABNORMAL HIGH (ref 11.2–14.5)
WBC Count: 3.6 10*3/uL — ABNORMAL LOW (ref 3.9–10.3)

## 2018-08-31 MED ORDER — LENALIDOMIDE 5 MG PO CAPS CTSU E1A11: ORAL_CAPSULE | ORAL | 0 refills | Status: DC

## 2018-08-31 NOTE — Research (Signed)
08/31/2018 Patient in to clinic this morning for re-evaluation following one week delay for maintenance treatment cycle 48. Patient reports no new symptoms over the past week, and states that she feels a bit better having an extra week off of treatment. An updated printed appointment calendar for October, with Cycle 48treatment days marked, was given to the patient for documentation of doses for the next treatment cycle, which will begin today, provided CBC results meet criteria for retreatment. Informed patient that schedulers will be contacted regarding the skeletal survey ordered for 09/21/18, and that the appointment can be coordinated with her lab appointment at [facility], moving that lab visit if necessary, based on radiology schedule. Patient voiced understanding. Following patient's departure from the clinic, CBC results were obtained and found to meet criteria for retreatment. Spoke with patient by phone (home phone number) to let her known to continue treatment with Day 1 dosing beginning today. Also discussed the possibility of needing to repeat CBC on Day 1 of next cycle, if results obtained within one week do not meet criteria for retreatment. Patient voiced understanding. [name] BSN, RN, [place] 08/31/2018 9:02 AM   Adverse Event Log Study/Protocol: CTSU ECOG E1A11 Maintenance Cycle 47: 07/27/2018 - 08/31/2018 (end of Cycle = 01/30/59) Solicited  Events Grade Comments  Fatigue  Grade 2  Moderate, occasionally limiting ADLs (unchanged from previous)  Sensory neuropathy  Grade 2 Moderate symptoms, but feels may be slightly worse than previous  Insomnia  Grade 2  Moderate difficulty, takes sleeping pill regularly, unchanged  Neutrophil count decreased Grade 2-3     Diarrhea Grade 1 Multiple days, 1-3 stools over baseline; no longer using stool softener  Non-reportable AEs - unsolicited, < Grade 3 (grade): - Leukopenia (1) - Moderate pain (2)  Back pain  Bone pain - Leg pain (2) -  Anemia (1) - Muscle pain (2) - Neck pain (2) - Boredom (1) - Constipation (2) [name] BSN, RN, [facility] 09/01/2018 4:21 PM

## 2018-09-13 ENCOUNTER — Other Ambulatory Visit: Payer: Self-pay | Admitting: Hematology and Oncology

## 2018-09-13 NOTE — Telephone Encounter (Signed)
Pls refill electronically °

## 2018-09-14 ENCOUNTER — Other Ambulatory Visit: Payer: Medicare Other

## 2018-09-20 ENCOUNTER — Telehealth: Payer: Self-pay | Admitting: Neurology

## 2018-09-20 NOTE — Telephone Encounter (Signed)
Spoke with [facility]. Patient was switched to 90 day supply earlier this year d/t insurance requirement. She doesn't have enough left for 90 days. Pt will be given one 3 month supply to get her through her appointment. Kimber aware pt will need to be seen at her scheduled December f/u before further refills will be given.   FYI Dr. Jaynee Eagles

## 2018-09-20 NOTE — Telephone Encounter (Signed)
Spoke with patient and advised her that in order to continue prescribing refills, we will need to see her in the office yearly for follow up. She verbalized understanding. She was in agreement to see [facility] NP again. She was scheduled for Wed 12/4 @ 08:30 arrival time 08:00. She verbalized appreciation.

## 2018-09-21 ENCOUNTER — Encounter: Payer: Medicare Other | Admitting: *Deleted

## 2018-09-21 ENCOUNTER — Ambulatory Visit: Payer: Medicare Other | Admitting: Hematology and Oncology

## 2018-09-21 ENCOUNTER — Ambulatory Visit (HOSPITAL_COMMUNITY)
Admission: RE | Admit: 2018-09-21 | Discharge: 2018-09-21 | Disposition: A | Discharge status: Home / Self Care | Payer: Medicare Other | Source: Ambulatory Visit | Attending: Hematology and Oncology | Admitting: Hematology and Oncology

## 2018-09-21 ENCOUNTER — Inpatient Hospital Stay: Payer: Medicare Other

## 2018-09-21 DIAGNOSIS — C9001 Multiple myeloma in remission: Secondary | ICD-10-CM | POA: Diagnosis not present

## 2018-09-21 LAB — CBC WITH DIFFERENTIAL (CANCER CENTER ONLY)
Abs Immature Granulocytes: 0.01 10*3/uL (ref 0.00–0.07)
Basophils Absolute: 0 10*3/uL (ref 0.0–0.1)
Basophils Relative: 1 %
Eosinophils Absolute: 0.1 10*3/uL (ref 0.0–0.5)
Eosinophils Relative: 4 %
HCT: 39.2 % (ref 36.0–46.0)
Hemoglobin: 12 g/dL (ref 12.0–15.0)
Immature Granulocytes: 0 %
Lymphocytes Relative: 45 %
Lymphs Abs: 1.5 10*3/uL (ref 0.7–4.0)
MCH: 22.9 pg — ABNORMAL LOW (ref 26.0–34.0)
MCHC: 30.6 g/dL (ref 30.0–36.0)
MCV: 74.7 fL — ABNORMAL LOW (ref 80.0–100.0)
Monocytes Absolute: 0.4 10*3/uL (ref 0.1–1.0)
Monocytes Relative: 12 %
Neutro Abs: 1.2 10*3/uL — ABNORMAL LOW (ref 1.7–7.7)
Neutrophils Relative %: 38 %
Platelet Count: 174 10*3/uL (ref 150–400)
RBC: 5.25 MIL/uL — ABNORMAL HIGH (ref 3.87–5.11)
RDW: 15.1 % (ref 11.5–15.5)
WBC Count: 3.2 10*3/uL — ABNORMAL LOW (ref 4.0–10.5)
nRBC: 0 % (ref 0.0–0.2)

## 2018-09-21 LAB — CMP (CANCER CENTER ONLY)
ALT: 40 U/L (ref 0–44)
AST: 22 U/L (ref 15–41)
Albumin: 3.7 g/dL (ref 3.5–5.0)
Alkaline Phosphatase: 64 U/L (ref 38–126)
Anion gap: 10 (ref 5–15)
BUN: 12 mg/dL (ref 8–23)
CO2: 26 mmol/L (ref 22–32)
Calcium: 9.4 mg/dL (ref 8.9–10.3)
Chloride: 104 mmol/L (ref 98–111)
Creatinine: 0.98 mg/dL (ref 0.44–1.00)
GFR, Est AFR Am: >60 mL/min (ref >60)
GFR, Estimated: >60 mL/min (ref >60)
Glucose, Bld: 148 mg/dL — ABNORMAL HIGH (ref 70–99)
Potassium: 3.6 mmol/L (ref 3.5–5.1)
Sodium: 140 mmol/L (ref 135–145)
Total Bilirubin: 0.6 mg/dL (ref 0.3–1.2)
Total Protein: 7.7 g/dL (ref 6.5–8.1)

## 2018-09-21 LAB — TSH: TSH: 1.207 u[IU]/mL (ref 0.308–3.960)

## 2018-09-21 LAB — LACTATE DEHYDROGENASE: LDH: 169 U/L (ref 98–192)

## 2018-09-22 LAB — KAPPA/LAMBDA LIGHT CHAINS
Kappa free light chain: 21.4 mg/L — ABNORMAL HIGH (ref 3.3–19.4)
Kappa, lambda light chain ratio: 1.17 (ref 0.26–1.65)
Lambda free light chains: 18.3 mg/L (ref 5.7–26.3)

## 2018-09-23 LAB — MULTIPLE MYELOMA PANEL, SERUM
Albumin SerPl Elph-Mcnc: 3.7 g/dL (ref 2.9–4.4)
Albumin/Glob SerPl: 1.1 (ref 0.7–1.7)
Alpha 1: 0.2 g/dL (ref 0.0–0.4)
Alpha2 Glob SerPl Elph-Mcnc: 0.6 g/dL (ref 0.4–1.0)
B-Globulin SerPl Elph-Mcnc: 1.2 g/dL (ref 0.7–1.3)
Gamma Glob SerPl Elph-Mcnc: 1.6 g/dL (ref 0.4–1.8)
Globulin, Total: 3.5 g/dL (ref 2.2–3.9)
IgA: 126 mg/dL (ref 87–352)
IgG (Immunoglobin G), Serum: 1695 mg/dL — ABNORMAL HIGH (ref 700–1600)
IgM (Immunoglobulin M), Srm: 70 mg/dL (ref 26–217)
Total Protein ELP: 7.2 g/dL (ref 6.0–8.5)

## 2018-09-27 ENCOUNTER — Encounter: Payer: Self-pay | Admitting: *Deleted

## 2018-09-27 ENCOUNTER — Other Ambulatory Visit: Payer: Self-pay

## 2018-09-27 DIAGNOSIS — C9001 Multiple myeloma in remission: Secondary | ICD-10-CM

## 2018-09-27 MED ORDER — LENALIDOMIDE 5 MG PO CAPS CTSU E1A11: ORAL_CAPSULE | ORAL | 0 refills | Status: DC

## 2018-09-28 ENCOUNTER — Encounter: Payer: Self-pay | Admitting: Hematology and Oncology

## 2018-09-28 ENCOUNTER — Telehealth: Payer: Self-pay | Admitting: Hematology and Oncology

## 2018-09-28 ENCOUNTER — Inpatient Hospital Stay (HOSPITAL_BASED_OUTPATIENT_CLINIC_OR_DEPARTMENT_OTHER): Payer: Medicare Other | Admitting: Hematology and Oncology

## 2018-09-28 ENCOUNTER — Inpatient Hospital Stay: Payer: Medicare Other | Admitting: *Deleted

## 2018-09-28 DIAGNOSIS — Z79899 Other long term (current) drug therapy: Secondary | ICD-10-CM

## 2018-09-28 DIAGNOSIS — C9001 Multiple myeloma in remission: Secondary | ICD-10-CM

## 2018-09-28 DIAGNOSIS — Z87891 Personal history of nicotine dependence: Secondary | ICD-10-CM | POA: Diagnosis not present

## 2018-09-28 DIAGNOSIS — M898X9 Other specified disorders of bone, unspecified site: Secondary | ICD-10-CM | POA: Diagnosis not present

## 2018-09-28 DIAGNOSIS — G8929 Other chronic pain: Secondary | ICD-10-CM

## 2018-09-28 DIAGNOSIS — Z006 Encounter for examination for normal comparison and control in clinical research program: Secondary | ICD-10-CM

## 2018-09-28 DIAGNOSIS — C9000 Multiple myeloma not having achieved remission: Secondary | ICD-10-CM

## 2018-09-28 DIAGNOSIS — D61811 Other drug-induced pancytopenia: Secondary | ICD-10-CM | POA: Diagnosis not present

## 2018-09-28 MED ORDER — LENALIDOMIDE 5 MG PO CAPS CTSU E1A11: ORAL_CAPSULE | ORAL | 0 refills | Status: DC

## 2018-09-28 MED ORDER — OXYCODONE HCL 15 MG PO TABS: 15.0000 mg | ORAL_TABLET | Freq: Three times a day (TID) | ORAL | 0 refills | Status: DC | PRN

## 2018-09-28 NOTE — Research (Signed)
09/28/2018  Patient in to clinic accompanied by her husband, Arnell Sieving, today for evaluation prior to beginning treatment Cycle 49. Upon arrival to clinic, patient completed her patient reported outcomes questionnaires independently. Patient reports ongoing complaints as noted in AE table and list below. Per MD review and per protocol criteria, findings on bone survey are not felt to represent definite development of new bone lesions. Based on lab results review and history and physical by Dr. Alvy Bimler, patient meets criteria for continued treatment with Revlimid. Patient returned completed cycle 48 Medication Calendar, confirming dosing at dose level -3, Revlimid 5mg  every other day, Days 1-21, with no missed doses. Patient given printed appointment calendar with Cycle 49 treatment days marked, to document doses for the next treatment cycle, which will begin today.  [name] BSN, RN, CCRP 09/28/2018 9:11 AM  Adverse Event Log Study/Protocol: CTSU ECOG E1A11 Maintenance Cycles 46-48: 06/29/18 - 09/28/18 (end of Cycle 48 = 09/27/18)  Event Grade Attribution to lenalidomide Cycle # Comments  Fatigue  Grade 2  Probable 46-48 Moderate, occasionally limiting ADLs (unchanged from previous)  Sensory neuropathy  Grade 2 Unlikely 46-48 Moderate symptoms, unchanged from previous  Insomnia  Grade 2  Unlikely 46-48 Moderate difficulty, unchanged  Neutrophil count decreased Grade 2  Definite 46-48    Neutrophil count decreased Grade 3 Definite 47    Anemia Grade 1 Definite 46-47    Diarrhea Grade 1 Probable 46-48 Occasional episodes, approximately 2/day over baseline.  Dyspnea Grade 1 Unrelated 46 With moderate exertion x 1 day  Non-reportable AEs - unsolicited, < Grade 3 (grade): - Headaches - moderate  (2) - Migraines - moderate (2) - Left-sided weakness (1) - Leukopenia (1) - Moderate pain (2)  Joint pain - knee, hip  Back pain  Bone pain  Leg pain  Arm pain - Abdominal pain (1) - Weight  loss (1) - Itching (1) - Hypertension (1) - T-wave abnormality (1) - LV diastolic dysfunction (1) - Elevated glucose (1) - Muscle pain (2) - Neck pain (2) - Boredom (1) - Constipation (2) - Mild depression (1) [name] BSN, RN, CCRP 10/25/2018 1:42 PM

## 2018-09-28 NOTE — Telephone Encounter (Signed)
Gave avs  ° °

## 2018-09-28 NOTE — Assessment & Plan Note (Signed)
She has mild, intermittent pancytopenia, predominantly occasional neutropenia. This is likely due to recent treatment. The patient denies recent history of fevers, cough, chills, diarrhea or dysuria. She is asymptomatic from the leukopenia. I will observe for now.  I will continue the chemotherapy at current dose without dosage adjustment.  If the leukopenia gets progressive worse in the future, I might have to delay her treatment or adjust the chemotherapy dose per protocol

## 2018-09-28 NOTE — Assessment & Plan Note (Signed)
She have chronic bone pain which started with a diagnosis of multiple myeloma. We discussed chronic pain management. MRI did suggest she had evidence of degenerative disc disease and spinal stenosis. After much discussion, the patient is comfortable to remain on chronic narcotic prescription. We discussed narcotic refill policy

## 2018-09-28 NOTE — Assessment & Plan Note (Signed)
I reviewed test results with her  So far, her last bone marrow biopsy and recent blood work confirmed complete remission. Skeletal survey did not show any new lesions The patient is comfortable to remain on Revlimid indefinitely. She will continue treatment per research protocol She will continue calcium with vitamin D supplement along with aspirin for DVT prophylaxis. 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 I will see her back as scheduled

## 2018-09-28 NOTE — Progress Notes (Signed)
[place] OFFICE PROGRESS NOTE  Patient Care Team: Wenda Low, MD as PCP - General (Internal Medicine) Heath Lark, MD as Consulting Physician (Hematology and Oncology) Benson Norway, RN as Registered Nurse (Oncology)  ASSESSMENT & PLAN:  Multiple myeloma in remission [facility]) I reviewed test results with her  So far, her last bone marrow biopsy and recent blood work confirmed complete remission. Skeletal survey did not show any new lesions The patient is comfortable to remain on Revlimid indefinitely. She will continue treatment per research protocol She will continue calcium with vitamin D supplement along with aspirin for DVT prophylaxis. 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 I will see her back as scheduled  Drug-induced pancytopenia ([place]) She has mild, intermittent pancytopenia, predominantly occasional neutropenia. This is likely due to recent treatment. The patient denies recent history of fevers, cough, chills, diarrhea or dysuria. She is asymptomatic from the leukopenia. I will observe for now.  I will continue the chemotherapy at current dose without dosage adjustment.  If the leukopenia gets progressive worse in the future, I might have to delay her treatment or adjust the chemotherapy dose per protocol   Bone pain She have chronic bone pain which started with a diagnosis of multiple myeloma. We discussed chronic pain management. MRI did suggest she had evidence of degenerative disc disease and spinal stenosis. After much discussion, the patient is comfortable to remain on chronic narcotic prescription. We discussed narcotic refill policy   No orders of the defined types were placed in this encounter.   INTERVAL HISTORY: Please see below for problem oriented charting. She returns with her husband for further follow-up She is up-to-date with her vaccination Denies recent infection, fever or chills She has  intermittent diarrhea that resolved spontaneously No new bone pain She has chronic musculoskeletal pain, stable with current prescription pain medicine as needed along with calcium, vitamin D and exercise  SUMMARY OF ONCOLOGIC HISTORY: Oncology History   ISS stage 1 IgG lambda subtype (serum albumin 3.6, Beta2 microglobulin 2.32) Durie Salmon Stage 1     Multiple myeloma in remission ([place])   10/10/2013 Imaging    Skeletal survery was negative    11/09/2013 Bone Marrow Biopsy    BM biopsy confirmed myeloma, 76% involved, IgG lambda subtype    12/06/2013 - 08/29/2014 Chemotherapy    Sh received chemo with revlimid, Velcade, Dexamethasone and Zometa. Patient particpated in clinical research [facility] [phone]    02/23/2014 Bone Marrow Biopsy    Repeat bone marrow biopsy showed 5% involvement    03/31/2014 Adverse Reaction    Zometa was discontinued due to osteonecrosis of the jaw.    05/05/2014 Imaging    Imaging study of the neck showed no explanation that could cause right neck pain. She is noted to have incidental left upper lung nodule. Plan to repeat imaging study in 3 months.    09/06/2014 Imaging    Bone survey showed no evidence of fracture    09/14/2014 Bone Marrow Biopsy    Bone marrow biopsy showed 8% residual plasma cells by manual count but none on the biopsy specimen    09/26/2014 -  Chemotherapy    She is started on cycle 1 of maintenance Revlimid    09/26/2014 -  Chemotherapy    The patient had [No matching medication found in this treatment plan]  for chemotherapy treatment.     01/31/2015 Imaging     chest x-ray showed pneumonia. Treatment  was placed on hold.    05/03/2015 Bone Marrow Biopsy    Accession: [id number] repeat bone marrow aspirate and biopsy show 5% residual plasma cells    10/14/2016 Bone Marrow Biopsy    Bone marrow biopsy showed the plasma cells represent 4% of all cells with lack of large aggregates or sheets. To assess the plasma cell clonality,  immunohistochemical stains is performed and it lack clonality. Normal cytogenetics and FISH    01/09/2017 Imaging    CT chest showed ground-glass 1.5 cm apical left upper lobe pulmonary nodule. Initial follow-up with CT at 6-12 months is recommended to confirm persistence. If persistent, repeat CT is recommended every 2 years until 5 years of stability has been established. This recommendation follows the consensus statement: Guidelines for Management of Incidental Pulmonary Nodules Detected on CT Images: From the Fleischner Society 2017; Radiology 2017; 284:228-243. 2. Mild patchy ground-glass opacities in the right upper lobe, probably inflammatory, which can also be reassessed on follow-up chest CT performed for the above dominant ground-glass nodule. 3. Solitary 3 mm right lower lobe solid pulmonary nodule, which can also be reassessed on follow-up chest CT performed for the above dominant ground-glass nodule. 4. No thoracic adenopathy. 5. Aortic atherosclerosis. Two-vessel coronary atherosclerosis.    06/30/2017 Imaging    1. No interval change in the 11 x 13 mm ground-glass nodule left upper lobe. Given the nearly 6 months of imaging stability, repeat CT is recommended every 2 years until 5 years of stability has been established. This recommendation follows the consensus statement: Guidelines for Management of Incidental Pulmonary Nodules Detected on CT Images: From the Fleischner Society 2017; Radiology 2017; 284:228-243. 2. Interval resolution of the tiny patchy ground-glass nodules right upper lobe, likely infectious/inflammatory etiology. 3. Stable 3 mm right lower lobe pulmonary nodule. Attention on follow-up recommended. 4. Aortic Atherosclerois (ICD10-170.0)    09/29/2017 Imaging    Skeletal survey 1. Questionable new tiny lucency noted the posterior portion of C3 vertebral body. This may represent a small lytic lesion.  2. No definite thoracic lesion noted on today's exam.  Stable lucencies in the left ilium and acetabulum.  3. No other focal abnormalities identified. The left hip is unremarkable .     REVIEW OF SYSTEMS:   Constitutional: Denies fevers, chills or abnormal weight loss Eyes: Denies blurriness of vision Ears, nose, mouth, throat, and face: Denies mucositis or sore throat Respiratory: Denies cough, dyspnea or wheezes Cardiovascular: Denies palpitation, chest discomfort or lower extremity swelling Skin: Denies abnormal skin rashes Lymphatics: Denies new lymphadenopathy or easy bruising Neurological:Denies numbness, tingling or new weaknesses Behavioral/Psych: Mood is stable, no new changes  All other systems were reviewed with the patient and are negative.  I have reviewed the past medical history, past surgical history, social history and family history with the patient and they are unchanged from previous note.  ALLERGIES:  has No Known Allergies.  MEDICATIONS:  Current Outpatient Medications  Medication Sig Dispense Refill  . aspirin 325 MG tablet Take 325 mg by mouth daily.     . Cholecalciferol (VITAMIN D3) 2000 UNITS TABS Take 2,000 Units by mouth daily.    . hydrochlorothiazide (HYDRODIURIL) 25 MG tablet Take 25 mg by mouth every evening.     Marland Kitchen lenalidomide (REVLIMID) 5 MG capsule TAKE 1 CAPSULE BY MOUTH EVERY OTHER DAY FOR 21 DAYS, OFF 7 DAYS. REPEAT EVERY 28 DAYS 11 capsule 0  . levothyroxine (SYNTHROID, LEVOTHROID) 75 MCG tablet Take 75 mcg by mouth daily before breakfast.    .  metFORMIN (GLUCOPHAGE-XR) 500 MG 24 hr tablet Take 500 mg by mouth daily.  0  . methocarbamol (ROBAXIN) 500 MG tablet Take 1 tablet (500 mg total) by mouth every 8 (eight) hours as needed for muscle spasms. 90 tablet 1  . Multiple Vitamins-Minerals (CENTRUM SILVER PO) Take 1 tablet by mouth daily.     Marland Kitchen oxyCODONE (ROXICODONE) 15 MG immediate release tablet Take 1 tablet (15 mg total) by mouth 3 (three) times daily as needed for pain. 90 tablet 0  .  polyethylene glycol (MIRALAX / GLYCOLAX) packet Take 17 g by mouth daily as needed for mild constipation.     . RESTASIS 0.05 % ophthalmic emulsion PLACE 1 DROP IN BOTH EYES IN THE EVENING  4  . traZODone (DESYREL) 50 MG tablet TAKE 1 TABLET (50 MG TOTAL) BY MOUTH AT BEDTIME. 90 tablet 3  . venlafaxine XR (EFFEXOR-XR) 150 MG 24 hr capsule TAKE 1 CAPSULE (150 MG TOTAL) BY MOUTH DAILY WITH BREAKFAST. 90 capsule 1  . verapamil (VERELAN PM) 120 MG 24 hr capsule TAKE 1 CAPSULE BY MOUTH DAILY 90 capsule 0   No current facility-administered medications for this visit.    Facility-Administered Medications Ordered in Other Visits  Medication Dose Route Frequency Provider Last Rate Last Dose  . gadopentetate dimeglumine (MAGNEVIST) injection 15 mL  15 mL Intravenous Once PRN Melvenia Beam, MD        PHYSICAL EXAMINATION: ECOG PERFORMANCE STATUS: 1 - Symptomatic but completely ambulatory  Vitals:   09/28/18 0806  BP: 128/73  Pulse: 64  Resp: 18  Temp: 97.6 F (36.4 C)  SpO2: 100%   Filed Weights   09/28/18 0806  Weight: 170 lb 6.4 oz (77.3 kg)    GENERAL:alert, no distress and comfortable SKIN: skin color, texture, turgor are normal, no rashes or significant lesions EYES: normal, Conjunctiva are pink and non-injected, sclera clear OROPHARYNX:no exudate, no erythema and lips, buccal mucosa, and tongue normal  NECK: supple, thyroid normal size, non-tender, without nodularity LYMPH:  no palpable lymphadenopathy in the cervical, axillary or inguinal LUNGS: clear to auscultation and percussion with normal breathing effort HEART: regular rate & rhythm and no murmurs and no lower extremity edema ABDOMEN:abdomen soft, non-tender and normal bowel sounds Musculoskeletal:no cyanosis of digits and no clubbing  NEURO: alert & oriented x 3 with fluent speech, no focal motor/sensory deficits  LABORATORY DATA:  I have reviewed the data as listed    Component Value Date/Time   NA 140 09/21/2018  0946   NA 141 09/29/2017 0830   K 3.6 09/21/2018 0946   K 3.6 09/29/2017 0830   CL 104 09/21/2018 0946   CO2 26 09/21/2018 0946   CO2 29 09/29/2017 0830   GLUCOSE 148 (H) 09/21/2018 0946   GLUCOSE 87 09/29/2017 0830   BUN 12 09/21/2018 0946   BUN 14.0 09/29/2017 0830   CREATININE 0.98 09/21/2018 0946   CREATININE 0.9 09/29/2017 0830   CALCIUM 9.4 09/21/2018 0946   CALCIUM 9.6 09/29/2017 0830   PROT 7.7 09/21/2018 0946   PROT 7.3 09/29/2017 0832   PROT 8.0 09/29/2017 0830   ALBUMIN 3.7 09/21/2018 0946   ALBUMIN 3.8 09/29/2017 0830   AST 22 09/21/2018 0946   AST 21 09/29/2017 0830   ALT 40 09/21/2018 0946   ALT 69 (H) 09/29/2017 0830   ALKPHOS 64 09/21/2018 0946   ALKPHOS 60 09/29/2017 0830   BILITOT 0.6 09/21/2018 0946   BILITOT 0.46 09/29/2017 0830   GFRNONAA >60 09/21/2018 0946  GFRAA >60 09/21/2018 0946    No results found for: SPEP, UPEP  Lab Results  Component Value Date   WBC 3.2 (L) 09/21/2018   NEUTROABS 1.2 (L) 09/21/2018   HGB 12.0 09/21/2018   HCT 39.2 09/21/2018   MCV 74.7 (L) 09/21/2018   PLT 174 09/21/2018      Chemistry      Component Value Date/Time   NA 140 09/21/2018 0946   NA 141 09/29/2017 0830   K 3.6 09/21/2018 0946   K 3.6 09/29/2017 0830   CL 104 09/21/2018 0946   CO2 26 09/21/2018 0946   CO2 29 09/29/2017 0830   BUN 12 09/21/2018 0946   BUN 14.0 09/29/2017 0830   CREATININE 0.98 09/21/2018 0946   CREATININE 0.9 09/29/2017 0830      Component Value Date/Time   CALCIUM 9.4 09/21/2018 0946   CALCIUM 9.6 09/29/2017 0830   ALKPHOS 64 09/21/2018 0946   ALKPHOS 60 09/29/2017 0830   AST 22 09/21/2018 0946   AST 21 09/29/2017 0830   ALT 40 09/21/2018 0946   ALT 69 (H) 09/29/2017 0830   BILITOT 0.6 09/21/2018 0946   BILITOT 0.46 09/29/2017 0830       RADIOGRAPHIC STUDIES: I have personally reviewed the radiological images as listed and agreed with the findings in the report. Dg Bone Survey Met  Result Date:  09/21/2018 CLINICAL DATA:  Multiple myeloma. EXAM: METASTATIC BONE SURVEY COMPARISON:  Bone survey 09/29/2017. FINDINGS: Standard imaging of the axial and appendicular skeleton performed. No focal skull lesions noted. No C2 or C3 lesion noted on today's exam. Questionable very tiny lucencies noted along the posterior base of C4 and along the anterior aspect of C5. Questionable very tiny mid and lower thoracic vertebral body lucencies are noted. These may just be related overlying shadows. Stable left iliac and acetabular lucencies. No appendicular lesions are identified. IMPRESSION: 1. No C2 or C3 lesion noted on today's exam. Question very tiny lucencies are noted along the posterior base of C4 and along the anterior aspect of C5. Questionable very tiny mid and lower thoracic vertebral body lucencies also cannot be excluded. 2.  Stable left iliac and acetabular lucencies. Electronically Signed   By: Marcello Moores  Register   On: 09/21/2018 15:25    All questions were answered. The patient knows to call the clinic with any problems, questions or concerns. No barriers to learning was detected.  I spent 15 minutes counseling the patient face to face. The total time spent in the appointment was 20 minutes and more than 50% was on counseling and review of test results  Heath Lark, MD 09/28/2018 8:12 AM

## 2018-10-17 ENCOUNTER — Other Ambulatory Visit: Payer: Self-pay | Admitting: Neurology

## 2018-10-19 ENCOUNTER — Other Ambulatory Visit: Payer: Self-pay | Admitting: Hematology and Oncology

## 2018-10-19 DIAGNOSIS — C9001 Multiple myeloma in remission: Secondary | ICD-10-CM

## 2018-10-20 ENCOUNTER — Encounter: Payer: Self-pay | Admitting: Hematology and Oncology

## 2018-10-20 ENCOUNTER — Telehealth: Payer: Self-pay | Admitting: *Deleted

## 2018-10-20 NOTE — Telephone Encounter (Signed)
10/20/2018 Spoke with patient by phone today regarding upcoming visit on 10/26/18 for next cycle of maintenance treatment. Noted that research nurse will be out of the office that day, and instead she will be seen by [facility]) Carla Perry, who is known to the patient from her work with Dr. Alvy Bimler. Also asked patient if she has medication on hand to start the next cycle, and she states she does not. Messages sent to Dr. Calton Dach nurses to follow up on the following:  Refill note by Sharlynn Oliphant on 10/19/18 stating "cannot get new celgene # until 10/27/18". Patient is due to start a new cycle on 10/26/18 and needs medication before then. Research nurse previously contacted Harrel Lemon RN following the patient's 09/28/18 visit, because there were two refills entered in [place] for October, one by Hassan Rowan and one by Ardyth Harps with different authorization numbers and different pharmacies. Requested review by Tammi to find out what is correct and make sure the patient will get her medication before next Tuesday. [name] BSN, RN, CCRP 10/20/2018 10:32 AM

## 2018-10-21 ENCOUNTER — Other Ambulatory Visit: Payer: Self-pay | Admitting: *Deleted

## 2018-10-21 DIAGNOSIS — C9001 Multiple myeloma in remission: Secondary | ICD-10-CM

## 2018-10-21 MED ORDER — LENALIDOMIDE 5 MG PO CAPS: ORAL_CAPSULE | ORAL | 1 refills | Status: DC

## 2018-10-21 MED ORDER — LENALIDOMIDE 5 MG PO CAPS CTSU E1A11: ORAL_CAPSULE | ORAL | 0 refills | Status: DC

## 2018-10-26 ENCOUNTER — Inpatient Hospital Stay: Payer: Medicare Other | Attending: Hematology and Oncology

## 2018-10-26 ENCOUNTER — Inpatient Hospital Stay: Payer: Medicare Other | Admitting: *Deleted

## 2018-10-26 DIAGNOSIS — Z79899 Other long term (current) drug therapy: Secondary | ICD-10-CM | POA: Diagnosis not present

## 2018-10-26 DIAGNOSIS — C9001 Multiple myeloma in remission: Secondary | ICD-10-CM

## 2018-10-26 DIAGNOSIS — Z006 Encounter for examination for normal comparison and control in clinical research program: Secondary | ICD-10-CM | POA: Diagnosis present

## 2018-10-26 LAB — CBC WITH DIFFERENTIAL (CANCER CENTER ONLY)
Abs Immature Granulocytes: 0.01 10*3/uL (ref 0.00–0.07)
Basophils Absolute: 0 10*3/uL (ref 0.0–0.1)
Basophils Relative: 1 %
Eosinophils Absolute: 0.1 10*3/uL (ref 0.0–0.5)
Eosinophils Relative: 2 %
HCT: 37.7 % (ref 36.0–46.0)
Hemoglobin: 11.5 g/dL — ABNORMAL LOW (ref 12.0–15.0)
Immature Granulocytes: 0 %
Lymphocytes Relative: 55 %
Lymphs Abs: 2.2 10*3/uL (ref 0.7–4.0)
MCH: 22.7 pg — ABNORMAL LOW (ref 26.0–34.0)
MCHC: 30.5 g/dL (ref 30.0–36.0)
MCV: 74.5 fL — ABNORMAL LOW (ref 80.0–100.0)
Monocytes Absolute: 0.5 10*3/uL (ref 0.1–1.0)
Monocytes Relative: 13 %
Neutro Abs: 1.1 10*3/uL — ABNORMAL LOW (ref 1.7–7.7)
Neutrophils Relative %: 29 %
Platelet Count: 185 10*3/uL (ref 150–400)
RBC: 5.06 MIL/uL (ref 3.87–5.11)
RDW: 14.8 % (ref 11.5–15.5)
WBC Count: 3.9 10*3/uL — ABNORMAL LOW (ref 4.0–10.5)
nRBC: 0 % (ref 0.0–0.2)

## 2018-10-26 MED ORDER — LENALIDOMIDE 5 MG PO CAPS CTSU E1A11: ORAL_CAPSULE | ORAL | 0 refills | Status: DC

## 2018-10-26 NOTE — Research (Signed)
[facility]SU [id number] Patient in to clinic alone today for evaluation prior to beginning treatment Cycle 50 . Met with patient after her lab appointment.  Patient denies any new or worsening AEs.  She states diarrhea resolved on 10/15/18 after stopping Metformin. She took last dose of Metformin 10/14/18 and started on Januvia 10/15/18 as prescribed by her PCP.  Patient returned completed cycle 49 Medication Calendar, confirming dosing at dose level -3, Revlimid 43m every other day, Days 1-21, with no missed doses. Patient given printed appointment calendar with Cycle 50 treatment days marked, to document doses for the next treatment cycle, which will begin today.Patient did not want to wait in clinic for CBC results and understands this nurse will call her with results and let her know if it is ok to start her next cycle of Revlimid today as planned. Thanked patient for her ongoing participation in this clinical trial.  CBC resulted and reviewed by this research nurse and results also given to Dr. GLindi Adiewho is covering for Dr. GAlvy Bimlertoday. Per protocol and Dr. GLindi Adie based on lab results review and patient with no new or worsening AEs, criteria for continued treatment with Revlimid has been met. Called patient and left VM informing her of ANC 1.1 and ok to start cycle 50 of Revlimid as planned.  Asked her to return call if any questions or concerns.  CFoye Spurling BSN, RN Clinical Research Nurse 10/26/2018 8:43 AM

## 2018-11-03 ENCOUNTER — Encounter: Payer: Self-pay | Admitting: Adult Health

## 2018-11-03 ENCOUNTER — Ambulatory Visit: Payer: Medicare Other | Admitting: Adult Health

## 2018-11-03 VITALS — BP 120/70 | HR 79 | Ht 67.0 in | Wt 172.4 lb

## 2018-11-03 DIAGNOSIS — R202 Paresthesia of skin: Secondary | ICD-10-CM | POA: Diagnosis not present

## 2018-11-03 DIAGNOSIS — G43709 Chronic migraine without aura, not intractable, without status migrainosus: Secondary | ICD-10-CM

## 2018-11-03 MED ORDER — VENLAFAXINE HCL ER 150 MG PO CP24: 150.0000 mg | ORAL_CAPSULE | Freq: Every day | ORAL | 1 refills | Status: DC

## 2018-11-03 NOTE — Progress Notes (Signed)
PATIENT: Jenavieve Freda DOB: 1955-12-08  REASON FOR VISIT: follow up HISTORY FROM: patient  HISTORY OF PRESENT ILLNESS: Today 11/03/18:  Ms. Leder is a 62 year old female with a history of migraine headaches and paresthesias on the left side.  She returns today for follow-up.  She states that she has not had a true headache since October.  When she does get a headache it does occur on the left side of the head she typically can take tramadol and the headache resolved quickly.  She states that she has ongoing numbness on the left side of the body.  This is been present since she saw Dr. Erling Cruz.  The patient's work-up has been relatively unremarkable.  He denies any changes in her vision, speech denies any weakness.  She returns today for evaluation.  HISTORY( Copied from Dr. Cathren Laine note)  Interval history 06/25/2017: She has constant pressure on the left side of the head, she has 6/10 daily headache, migraines are on the left side, can last days with light and sound sensitivity, nausea, no vomiting, pounding pain severe, no aura, no medication overuse. She takes 2 tramadol at the onset of migraine. Dr. Erling Cruz had put her on Verapamil and Effexor. She does feel that the Verapamil helps, if she doesn't take it she get a migraine.   Interval history 06/25/2016: She gets 3-4 migraines a month, they last an hour and go away with the tramadol. She still have low back pain and radiation into the legs. She has numbness and weakness in the legs intermittently but MRI of the neural axis was negative. Kneeling thing we didn't do was imaging MRI of the lumbar spine. Considering that she continues to have numbness and weakness in the legs will image MRI of the lumbar spine.  Interval update 03/18/2016: This is a lovely 62 year old female with a very complicated past medical history including hypertension, thyroid disorder, migraine, multiple myeloma s/p chemo currently participating in a research trial,  neuropathy, insomnia, bone pain, back pain, joint pain, anemia, fatigue, leukopenia due to antineoplastic chemotherapy, right leg swelling, DVT, diabetes. I have seen patient in the past for chronic migraines but today she is here for a new problem,paresthesias that started February. Happens when waking up in the morning. Never happens otherwise. She feels an electrical current through her body with persistent tingling. Starts in the thoracic or in the lumbar spine. Feels like electricity. Starts in the spine. She feels tingling all over, in the face, arms, legs from head to toe. Will last several hours. A lot of times it is associated with a migraine. Nothing makes it better. Stretching aggravates the symptoms. She has pain in the spine associated with the paresthesias. Last night it started with numbness at 2am. She couldn't get back to sleep. She developed a headache. He still has residual headache and Her left eye still fells sore and head feels sore. Also a residual burning feeling. Arms feel like they are burning. Episodes becoming more frequent. 6 episodes in Feb, 8 in march, 13 so far in April. Becoming more frequent and lasting longer. She experiences facial numbness with the episodes, burning and tingling in the spine that spreads to her whole body. The symptoms are usually associated with headache or migraine. Moving around helps with the symptoms. No weakness. No vision changes but does describe eye pain. The headaches are pressure and pain.she still gets the vertigo with the migraines. Headache left side of the head with the vertigo, no light or  sound sensitivity. 5-10 minutes after the paresthesias she has the migraines/headaches. No inciting events. No trauma. She has low back pain, situated on the right side, radiates down the legs to the back of the legs, walking is not as good. Right leg goes numb. Paresthesias are more on the left.   Recent labs include normal B12. Normal TSH.  Interval  update7/19/2016: This is a former patient of Dr. Janann Colonel who is seen in our office by our nurse practitioner [name]. Patient is establishing care with me today. She is a 62 year old female with a history of chronic migraines. She was last seen in April. Today she returns and she is doing very well. She is on verapamil 120 mg extended release and venlafaxine XR 75 mg. She is doing very well. She has had maybe 3 migraines within the last few months. And they have been on the left side of the head with vertigo twice. When she has the vertigo, she has to lay in bed and sleep it off. But this is a significant improvement and she is very happy with this maintenance. She endorses nausea, photophobia and phonophobia. She takes tramadol for acute management. Discussed in detail that if she is doing well, we will keep her on her current migraine management. She can continue to follow up with [name] as needed.  REVIEW OF SYSTEMS: Out of a complete 14 system review of symptoms, the patient complains only of the following symptoms, and all other reviewed systems are negative.  Fatigue, back pain, numbness, anemia  ALLERGIES: No Known Allergies  HOME MEDICATIONS: Outpatient Medications Prior to Visit  Medication Sig Dispense Refill  . aspirin 325 MG tablet Take 325 mg by mouth daily.     . Cholecalciferol (VITAMIN D3) 2000 UNITS TABS Take 2,000 Units by mouth daily.    . hydrochlorothiazide (HYDRODIURIL) 25 MG tablet Take 25 mg by mouth every evening.     Marland Kitchen lenalidomide (REVLIMID) 5 MG capsule Take 1 capsule every other day for 21 days, off 7 days. Repeat every 28 days. 11 capsule 0  . levothyroxine (SYNTHROID, LEVOTHROID) 75 MCG tablet Take 75 mcg by mouth daily before breakfast.    . methocarbamol (ROBAXIN) 500 MG tablet Take 1 tablet (500 mg total) by mouth every 8 (eight) hours as needed for muscle spasms. 90 tablet 1  . Multiple Vitamins-Minerals (CENTRUM SILVER PO) Take 1 tablet by mouth  daily.     Marland Kitchen oxyCODONE (ROXICODONE) 15 MG immediate release tablet Take 1 tablet (15 mg total) by mouth 3 (three) times daily as needed for pain. 90 tablet 0  . polyethylene glycol (MIRALAX / GLYCOLAX) packet Take 17 g by mouth daily as needed for mild constipation.     . RESTASIS 0.05 % ophthalmic emulsion PLACE 1 DROP IN BOTH EYES IN THE EVENING  4  . sitaGLIPtin (JANUVIA) 50 MG tablet Take 50 mg by mouth daily.    . traZODone (DESYREL) 50 MG tablet TAKE 1 TABLET (50 MG TOTAL) BY MOUTH AT BEDTIME. 90 tablet 3  . venlafaxine XR (EFFEXOR-XR) 150 MG 24 hr capsule TAKE 1 CAPSULE (150 MG TOTAL) BY MOUTH DAILY WITH BREAKFAST. 90 capsule 1  . verapamil (VERELAN PM) 120 MG 24 hr capsule TAKE 1 CAPSULE BY MOUTH DAILY 90 capsule 0   Facility-Administered Medications Prior to Visit  Medication Dose Route Frequency Provider Last Rate Last Dose  . gadopentetate dimeglumine (MAGNEVIST) injection 15 mL  15 mL Intravenous Once PRN Melvenia Beam, MD  PAST MEDICAL HISTORY: Past Medical History:  Diagnosis Date  . Abnormal thyroid function test 08/30/2014  . Anemia   . Anemia, unspecified 10/06/2013  . Bone pain 10/21/2013  . Bronchitis 01/31/2015  . Diabetes mellitus without complication ([place]) 72/0947   Steroid induced diabetes. has not picked oral med up from pharmacy as of 09-14-14  . Diverticulitis 07/18/2014  . DVT (deep venous thrombosis) ([place]) 02/07/2014  . HBP (high blood pressure)   . Insomnia 02/11/2016  . Leukopenia due to antineoplastic chemotherapy ([place]) 12/27/2013  . Memory loss   . MGUS (monoclonal gammopathy of unknown significance)   . MGUS (monoclonal gammopathy of unknown significance) 10/06/2013  . Migraine   . Multiple myeloma, without mention of having achieved remission 11/16/2013  . Pancytopenia ([place]) 12/27/2015  . Peripheral neuropathy 12/27/2015  . Right leg swelling 02/07/2014  . Seizure ([place]) 1960   single seizure episode at age 59  . Thyroid disorder   . Vitamin D  deficiency 10/24/2014    PAST SURGICAL HISTORY: Past Surgical History:  Procedure Laterality Date  . HEMORRHOID SURGERY    . PORT-A-CATH REMOVAL  10-2014  . PORTACATH PLACEMENT Right jan 2015    FAMILY HISTORY: Family History  Problem Relation Age of Onset  . Throat cancer Father   . Stroke Father   . Cancer Brother        prostate  . Breast cancer Neg Hx     SOCIAL HISTORY: Social History   Socioeconomic History  . Marital status: Married    Spouse name: Arnell Sieving  . Number of children: 2  . Years of education: 24  . Highest education level: Not on file  Occupational History    Employer: [employer]  . Financial resource strain: Not on file  . Food insecurity:    Worry: Not on file    Inability: Not on file  . Transportation needs:    Medical: Not on file    Non-medical: Not on file  Tobacco Use  . Smoking status: Never Smoker  . Smokeless tobacco: Never Used  Substance and Sexual Activity  . Alcohol use: No    Alcohol/week: 0.0 standard drinks  . Drug use: No  . Sexual activity: Not on file  Lifestyle  . Physical activity:    Days per week: Not on file    Minutes per session: Not on file  . Stress: Not on file  Relationships  . Social connections:    Talks on phone: Not on file    Gets together: Not on file    Attends religious service: Not on file    Active member of club or organization: Not on file    Attends meetings of clubs or organizations: Not on file    Relationship status: Not on file  . Intimate partner violence:    Fear of current or ex partner: Not on file    Emotionally abused: Not on file    Physically abused: Not on file    Forced sexual activity: Not on file  Other Topics Concern  . Not on file  Social History Narrative   Patient lives at home with her husband Special educational needs teacher). Patient has two years college.   Right handed.   Caffeine- None      PHYSICAL EXAM  Vitals:   11/03/18 0815  BP: 120/70  Pulse: 79  Weight:  172 lb 6.4 oz (78.2 kg)  Height: 5' 7"  (1.702 m)   Body mass index is 27 kg/m.  Generalized: Well developed, in no acute distress   Neurological examination  Mentation: Alert oriented to time, place, history taking. Follows all commands speech and language fluent Cranial nerve II-XII: Pupils were equal round reactive to light. Extraocular movements were full, visual field were full on confrontational test. Facial sensation and strength were normal. Uvula tongue midline. Head turning and shoulder shrug  were normal and symmetric. Motor: The motor testing reveals 5 over 5 strength of all 4 extremities. Good symmetric motor tone is noted throughout.  Sensory: Sensory testing is intact to soft touch on all 4 extremities pinprick and vibration sensation intact on all 4 extremities.. No evidence of extinction is noted.  Coordination: Cerebellar testing reveals good finger-nose-finger and heel-to-shin bilaterally.  Gait and station: Gait is normal. Tandem gait is unsteady.  Romberg is negative. No drift is seen.  Reflexes: Deep tendon reflexes are symmetric and normal bilaterally.   DIAGNOSTIC DATA (LABS, IMAGING, TESTING) - I reviewed patient records, labs, notes, testing and imaging myself where available.  Lab Results  Component Value Date   WBC 3.9 (L) 10/26/2018   HGB 11.5 (L) 10/26/2018   HCT 37.7 10/26/2018   MCV 74.5 (L) 10/26/2018   PLT 185 10/26/2018      Component Value Date/Time   NA 140 09/21/2018 0946   NA 141 09/29/2017 0830   K 3.6 09/21/2018 0946   K 3.6 09/29/2017 0830   CL 104 09/21/2018 0946   CO2 26 09/21/2018 0946   CO2 29 09/29/2017 0830   GLUCOSE 148 (H) 09/21/2018 0946   GLUCOSE 87 09/29/2017 0830   BUN 12 09/21/2018 0946   BUN 14.0 09/29/2017 0830   CREATININE 0.98 09/21/2018 0946   CREATININE 0.9 09/29/2017 0830   CALCIUM 9.4 09/21/2018 0946   CALCIUM 9.6 09/29/2017 0830   PROT 7.7 09/21/2018 0946   PROT 7.3 09/29/2017 0832   PROT 8.0 09/29/2017 0830    ALBUMIN 3.7 09/21/2018 0946   ALBUMIN 3.8 09/29/2017 0830   AST 22 09/21/2018 0946   AST 21 09/29/2017 0830   ALT 40 09/21/2018 0946   ALT 69 (H) 09/29/2017 0830   ALKPHOS 64 09/21/2018 0946   ALKPHOS 60 09/29/2017 0830   BILITOT 0.6 09/21/2018 0946   BILITOT 0.46 09/29/2017 0830   GFRNONAA >60 09/21/2018 0946   GFRAA >60 09/21/2018 0946    Lab Results  Component Value Date   OBSJGGEZ66 294 04/27/2018   Lab Results  Component Value Date   TSH 1.207 09/21/2018      ASSESSMENT AND PLAN 62 y.o. year old female  has a past medical history of Abnormal thyroid function test (08/30/2014), Anemia, Anemia, unspecified (10/06/2013), Bone pain (10/21/2013), Bronchitis (01/31/2015), Diabetes mellitus without complication ([place]) (76/5465), Diverticulitis (07/18/2014), DVT (deep venous thrombosis) ([place]) (02/07/2014), HBP (high blood pressure), Insomnia (02/11/2016), Leukopenia due to antineoplastic chemotherapy ([place]) (12/27/2013), Memory loss, MGUS (monoclonal gammopathy of unknown significance), MGUS (monoclonal gammopathy of unknown significance) (10/06/2013), Migraine, Multiple myeloma, without mention of having achieved remission (11/16/2013), Pancytopenia ([place]) (12/27/2015), Peripheral neuropathy (12/27/2015), Right leg swelling (02/07/2014), Seizure ([place]) (1960), Thyroid disorder, and Vitamin D deficiency (10/24/2014). here with:  1.  Migraine headaches 2.  Paresthesia  The patient will continue on verapamil and Effexor.  The patient's exam is relatively unremarkable.  There is no sensory alterations present on exam on the left side.  Advised the patient that if her symptoms worsen or she develops new symptoms she should let us know.  She will follow-up in 1 year or sooner  as needed.   Ward Givens, MSN, NP-C 11/03/2018, 8:34 AM [facility] [address] [place], [place] [address] [phone]

## 2018-11-03 NOTE — Patient Instructions (Signed)
Your Plan:  Continue Verapamil and Effexor If your symptoms worsen or you develop new symptoms please let us know.   Thank you for coming to see [place] at [facility]. I hope we have been able to provide you high quality care today.  You may receive a patient satisfaction survey over the next few weeks. We would appreciate your feedback and comments so that we may continue to improve ourselves and the health of our patients.

## 2018-11-03 NOTE — Progress Notes (Signed)
participated in, made any corrections needed, and agree with history, physical, neuro exam,assessment and plan as stated above.     Brookelynn Hamor, MD [facility]      

## 2018-11-08 ENCOUNTER — Other Ambulatory Visit: Payer: Self-pay | Admitting: Obstetrics & Gynecology

## 2018-11-08 DIAGNOSIS — Z1231 Encounter for screening mammogram for malignant neoplasm of breast: Secondary | ICD-10-CM

## 2018-11-10 ENCOUNTER — Other Ambulatory Visit: Payer: Self-pay | Admitting: Hematology and Oncology

## 2018-11-10 ENCOUNTER — Other Ambulatory Visit: Payer: Self-pay

## 2018-11-10 DIAGNOSIS — C9001 Multiple myeloma in remission: Secondary | ICD-10-CM

## 2018-11-10 MED ORDER — LENALIDOMIDE 5 MG PO CAPS CTSU E1A11: ORAL_CAPSULE | ORAL | 0 refills | Status: DC

## 2018-11-10 NOTE — Telephone Encounter (Signed)
pls refill through research

## 2018-11-11 ENCOUNTER — Telehealth: Payer: Self-pay

## 2018-11-11 NOTE — Telephone Encounter (Signed)
Faxed Revlimid Rx [facility] at [phone].

## 2018-11-15 ENCOUNTER — Ambulatory Visit
Admission: RE | Admit: 2018-11-15 | Discharge: 2018-11-15 | Disposition: A | Discharge status: Home / Self Care | Payer: Medicare Other | Source: Ambulatory Visit | Attending: Obstetrics & Gynecology | Admitting: Obstetrics & Gynecology

## 2018-11-15 DIAGNOSIS — Z1231 Encounter for screening mammogram for malignant neoplasm of breast: Secondary | ICD-10-CM

## 2018-11-15 HISTORY — DX: Personal history of antineoplastic chemotherapy: Z92.21

## 2018-11-22 ENCOUNTER — Other Ambulatory Visit: Payer: Self-pay | Admitting: *Deleted

## 2018-11-22 NOTE — Research (Signed)
Adverse Event Log Study/Protocol: CTSU ECOG E1A11 Maintenance Cycle 49: 09/28/2018 - 10/26/2018 (end of Cycle = 48/01/65) Solicited  Events Grade Comments  Anemia Grade 1   Fatigue  Grade 2  Moderate, occasionally limiting ADLs (unchanged from previous)  Sensory neuropathy  Grade 2 Moderate symptoms, unchanged from previous  Insomnia  Grade 2  Moderate difficulty, takes sleeping pill regularly, unchanged  Neutrophil count decreased Grade 2     Diarrhea Grade 1 Multiple days, 3 stools over baseline max; related to diabetes medication  Non-reportable AEs - unsolicited, < Grade 3 (grade): - Leukopenia (1) - Moderate pain (2)  Back pain  Bone pain - Leg pain (2) [name] BSN, RN, [facility] 11/22/2018 3:58 PM

## 2018-11-23 ENCOUNTER — Inpatient Hospital Stay: Payer: Medicare Other | Admitting: *Deleted

## 2018-11-23 ENCOUNTER — Encounter: Payer: Self-pay | Admitting: Hematology and Oncology

## 2018-11-23 ENCOUNTER — Encounter: Payer: Self-pay | Admitting: *Deleted

## 2018-11-23 ENCOUNTER — Inpatient Hospital Stay: Payer: Medicare Other | Attending: Hematology and Oncology

## 2018-11-23 DIAGNOSIS — C9001 Multiple myeloma in remission: Secondary | ICD-10-CM | POA: Insufficient documentation

## 2018-11-23 DIAGNOSIS — Z006 Encounter for examination for normal comparison and control in clinical research program: Secondary | ICD-10-CM | POA: Diagnosis present

## 2018-11-23 LAB — CBC WITH DIFFERENTIAL (CANCER CENTER ONLY)
Abs Immature Granulocytes: 0 10*3/uL (ref 0.00–0.07)
Basophils Absolute: 0 10*3/uL (ref 0.0–0.1)
Basophils Relative: 1 %
Eosinophils Absolute: 0.1 10*3/uL (ref 0.0–0.5)
Eosinophils Relative: 3 %
HCT: 37.1 % (ref 36.0–46.0)
Hemoglobin: 11.3 g/dL — ABNORMAL LOW (ref 12.0–15.0)
Immature Granulocytes: 0 %
Lymphocytes Relative: 54 %
Lymphs Abs: 2.2 10*3/uL (ref 0.7–4.0)
MCH: 23 pg — ABNORMAL LOW (ref 26.0–34.0)
MCHC: 30.5 g/dL (ref 30.0–36.0)
MCV: 75.6 fL — ABNORMAL LOW (ref 80.0–100.0)
Monocytes Absolute: 0.5 10*3/uL (ref 0.1–1.0)
Monocytes Relative: 13 %
Neutro Abs: 1.1 10*3/uL — ABNORMAL LOW (ref 1.7–7.7)
Neutrophils Relative %: 29 %
Platelet Count: 196 10*3/uL (ref 150–400)
RBC: 4.91 MIL/uL (ref 3.87–5.11)
RDW: 14.6 % (ref 11.5–15.5)
WBC Count: 4 10*3/uL (ref 4.0–10.5)
nRBC: 0 % (ref 0.0–0.2)

## 2018-11-23 MED ORDER — LENALIDOMIDE 5 MG PO CAPS CTSU E1A11: ORAL_CAPSULE | ORAL | 0 refills | Status: DC

## 2018-11-23 NOTE — Research (Signed)
11/23/2018 Research - CTSU ECOG-ACRIN A5B90 Maintenance Cycle 51 Patient in to clinic unaccompanied today for evaluation prior to beginning treatment Cycle 51. Patient returned completed cycle 51 Medication Calendar, confirming dosing at dose level -3, Revlimid 5mg  every other day, Days 1-21, with no missed doses. Patient given printed appointment calendar with Cycle 51 treatment days marked, to document doses for the next treatment cycle, which will begin today. Patient reports side effects over the past month including occasional GI complaints related to diet, specifically diarrhea, with fewer than 3 stools per day when it occurs. She reports days of moderate irritability and forgetfulness between December 3rd and 5th, associated with a lapse in her venlafaxine prescription. Symptoms resolved after receiving a refill from her neurologist and resuming medication following insurance approval. She also notes right-sided neck and back stiffness beginning 11/04/18 that resolved after a week. Otherwise, patient has her usual complaints of back and bone pain, as well as ongoing fatigue and insomnia, all of which are essentially unchanged in character or intensity. Patient is aware that she should contact the office for any symptoms or problems of concern, and that on-call staff are available 24/7 throughout the holiday. Patient voiced understanding.  See separate Research consent form encounter for documentation of reconsent. Prior to patient's departure, lab results were reviewed noting values within parameters for retreatment. Patient was advised of such and will resume treatment for cycle 51 beginning today. [name] BSN, RN, CCRP 11/23/2018 8:45 AM  Adverse Event Log Study/Protocol: CTSU ECOG E1A11 Maintenance Cycle 50: 10/26/2018 - 11/23/2018 (end of Cycle = 38/33/38) Solicited  Events Grade Comments  Anemia Grade 1   Fatigue  Grade 2  Moderate, occasionally limiting ADLs (unchanged from previous)   Sensory neuropathy  Grade 2 Moderate symptoms, unchanged from previous  Insomnia  Grade 2  Moderate difficulty, unchanged  Neutrophil count decreased Grade 2     Diarrhea Grade 1 Mild, fewer than 3 stools, lasting one day; felt to be related to vegetable intake  Irritability Grade 2 Related to venlafaxine withdrawal while waiting for new Rx  Non-reportable AEs - unsolicited, < Grade 3 (grade): - Moderate pain (2)  Back pain  Bone pain - Shoulder pain (2) - Neck & back stiffness on right side (2) - Lightheadedness (1) - Forgetfulness (2) [name] BSN, RN, [place] 12/17/2018 4:52 PM

## 2018-11-23 NOTE — Research (Signed)
[place] [phone] Reconsent Patient in to clinic today for routine study visit (see separate Research Encounter). At the time of the visit, the patient was notified of changes to the consent form based on protocol addendum #14, version date 08/23/18 with [place] active date of 11/01/18. Patient was notified of the minor changes to lenalidomide risks and noted that changes to carfilzomib risks were not applicable to this patient as she was not enrolled in treatment [place] [facility]. All of the patient's questions were answered and she agreed to continued participation in the research study. The revised ICF was signed and dated by the patient; all of her responses to questions throughout the document were unchanged from her previous responses. A copy of the signed consent form was given to the patient.  [name] BSN, RN, CCRP 11/23/2018 9:05 AM

## 2018-12-02 ENCOUNTER — Other Ambulatory Visit: Payer: Self-pay | Admitting: Hematology and Oncology

## 2018-12-02 DIAGNOSIS — C9001 Multiple myeloma in remission: Secondary | ICD-10-CM

## 2018-12-06 ENCOUNTER — Other Ambulatory Visit: Payer: Self-pay

## 2018-12-06 DIAGNOSIS — C9001 Multiple myeloma in remission: Secondary | ICD-10-CM

## 2018-12-06 MED ORDER — LENALIDOMIDE 5 MG PO CAPS: ORAL_CAPSULE | ORAL | 0 refills | Status: DC

## 2018-12-14 ENCOUNTER — Inpatient Hospital Stay: Payer: Medicare Other | Attending: Hematology and Oncology

## 2018-12-14 DIAGNOSIS — Z006 Encounter for examination for normal comparison and control in clinical research program: Secondary | ICD-10-CM | POA: Diagnosis present

## 2018-12-14 DIAGNOSIS — Z79899 Other long term (current) drug therapy: Secondary | ICD-10-CM | POA: Insufficient documentation

## 2018-12-14 DIAGNOSIS — C9001 Multiple myeloma in remission: Secondary | ICD-10-CM | POA: Insufficient documentation

## 2018-12-14 DIAGNOSIS — Z7982 Long term (current) use of aspirin: Secondary | ICD-10-CM | POA: Insufficient documentation

## 2018-12-14 DIAGNOSIS — M4800 Spinal stenosis, site unspecified: Secondary | ICD-10-CM | POA: Diagnosis not present

## 2018-12-14 DIAGNOSIS — D630 Anemia in neoplastic disease: Secondary | ICD-10-CM | POA: Insufficient documentation

## 2018-12-14 DIAGNOSIS — Z9221 Personal history of antineoplastic chemotherapy: Secondary | ICD-10-CM | POA: Diagnosis not present

## 2018-12-14 DIAGNOSIS — G893 Neoplasm related pain (acute) (chronic): Secondary | ICD-10-CM | POA: Insufficient documentation

## 2018-12-14 DIAGNOSIS — R197 Diarrhea, unspecified: Secondary | ICD-10-CM | POA: Insufficient documentation

## 2018-12-14 LAB — CMP (CANCER CENTER ONLY)
ALT: 32 U/L (ref 0–44)
AST: 21 U/L (ref 15–41)
Albumin: 3.8 g/dL (ref 3.5–5.0)
Alkaline Phosphatase: 54 U/L (ref 38–126)
Anion gap: 7 (ref 5–15)
BUN: 12 mg/dL (ref 8–23)
CO2: 27 mmol/L (ref 22–32)
Calcium: 9.4 mg/dL (ref 8.9–10.3)
Chloride: 106 mmol/L (ref 98–111)
Creatinine: 0.94 mg/dL (ref 0.44–1.00)
GFR, Est AFR Am: >60 mL/min (ref >60)
GFR, Estimated: >60 mL/min (ref >60)
Glucose, Bld: 89 mg/dL (ref 70–99)
Potassium: 3.7 mmol/L (ref 3.5–5.1)
Sodium: 140 mmol/L (ref 135–145)
Total Bilirubin: 0.6 mg/dL (ref 0.3–1.2)
Total Protein: 7.7 g/dL (ref 6.5–8.1)

## 2018-12-14 LAB — CBC WITH DIFFERENTIAL (CANCER CENTER ONLY)
Abs Immature Granulocytes: 0.01 10*3/uL (ref 0.00–0.07)
Basophils Absolute: 0 10*3/uL (ref 0.0–0.1)
Basophils Relative: 1 %
Eosinophils Absolute: 0.1 10*3/uL (ref 0.0–0.5)
Eosinophils Relative: 3 %
HCT: 38.5 % (ref 36.0–46.0)
Hemoglobin: 11.8 g/dL — ABNORMAL LOW (ref 12.0–15.0)
Immature Granulocytes: 0 %
Lymphocytes Relative: 53 %
Lymphs Abs: 2.1 10*3/uL (ref 0.7–4.0)
MCH: 23 pg — ABNORMAL LOW (ref 26.0–34.0)
MCHC: 30.6 g/dL (ref 30.0–36.0)
MCV: 74.9 fL — ABNORMAL LOW (ref 80.0–100.0)
Monocytes Absolute: 0.5 10*3/uL (ref 0.1–1.0)
Monocytes Relative: 12 %
Neutro Abs: 1.2 10*3/uL — ABNORMAL LOW (ref 1.7–7.7)
Neutrophils Relative %: 31 %
Platelet Count: 209 10*3/uL (ref 150–400)
RBC: 5.14 MIL/uL — ABNORMAL HIGH (ref 3.87–5.11)
RDW: 14.6 % (ref 11.5–15.5)
WBC Count: 4 10*3/uL (ref 4.0–10.5)
nRBC: 0 % (ref 0.0–0.2)

## 2018-12-14 LAB — TSH: TSH: 1.408 u[IU]/mL (ref 0.308–3.960)

## 2018-12-14 LAB — LACTATE DEHYDROGENASE: LDH: 161 U/L (ref 98–192)

## 2018-12-15 LAB — KAPPA/LAMBDA LIGHT CHAINS
Kappa free light chain: 23.1 mg/L — ABNORMAL HIGH (ref 3.3–19.4)
Kappa, lambda light chain ratio: 1.43 (ref 0.26–1.65)
Lambda free light chains: 16.2 mg/L (ref 5.7–26.3)

## 2018-12-16 LAB — MULTIPLE MYELOMA PANEL, SERUM
Albumin SerPl Elph-Mcnc: 3.7 g/dL (ref 2.9–4.4)
Albumin/Glob SerPl: 1.1 (ref 0.7–1.7)
Alpha 1: 0.2 g/dL (ref 0.0–0.4)
Alpha2 Glob SerPl Elph-Mcnc: 0.5 g/dL (ref 0.4–1.0)
B-Globulin SerPl Elph-Mcnc: 1.1 g/dL (ref 0.7–1.3)
Gamma Glob SerPl Elph-Mcnc: 1.7 g/dL (ref 0.4–1.8)
Globulin, Total: 3.5 g/dL (ref 2.2–3.9)
IgA: 128 mg/dL (ref 87–352)
IgG (Immunoglobin G), Serum: 1879 mg/dL — ABNORMAL HIGH (ref 700–1600)
IgM (Immunoglobulin M), Srm: 80 mg/dL (ref 26–217)
Total Protein ELP: 7.2 g/dL (ref 6.0–8.5)

## 2018-12-17 ENCOUNTER — Encounter: Payer: Self-pay | Admitting: *Deleted

## 2018-12-19 ENCOUNTER — Other Ambulatory Visit: Payer: Self-pay | Admitting: Neurology

## 2018-12-21 ENCOUNTER — Telehealth: Payer: Self-pay | Admitting: Hematology and Oncology

## 2018-12-21 ENCOUNTER — Encounter: Payer: Self-pay | Admitting: Hematology and Oncology

## 2018-12-21 ENCOUNTER — Inpatient Hospital Stay (HOSPITAL_BASED_OUTPATIENT_CLINIC_OR_DEPARTMENT_OTHER): Payer: Medicare Other | Admitting: Hematology and Oncology

## 2018-12-21 ENCOUNTER — Inpatient Hospital Stay: Payer: Medicare Other | Admitting: *Deleted

## 2018-12-21 DIAGNOSIS — Z9221 Personal history of antineoplastic chemotherapy: Secondary | ICD-10-CM

## 2018-12-21 DIAGNOSIS — C9001 Multiple myeloma in remission: Secondary | ICD-10-CM | POA: Diagnosis not present

## 2018-12-21 DIAGNOSIS — R197 Diarrhea, unspecified: Secondary | ICD-10-CM | POA: Diagnosis not present

## 2018-12-21 DIAGNOSIS — M4800 Spinal stenosis, site unspecified: Secondary | ICD-10-CM

## 2018-12-21 DIAGNOSIS — G893 Neoplasm related pain (acute) (chronic): Secondary | ICD-10-CM | POA: Diagnosis not present

## 2018-12-21 DIAGNOSIS — D630 Anemia in neoplastic disease: Secondary | ICD-10-CM

## 2018-12-21 DIAGNOSIS — Z7982 Long term (current) use of aspirin: Secondary | ICD-10-CM

## 2018-12-21 DIAGNOSIS — Z006 Encounter for examination for normal comparison and control in clinical research program: Secondary | ICD-10-CM

## 2018-12-21 DIAGNOSIS — Z79899 Other long term (current) drug therapy: Secondary | ICD-10-CM

## 2018-12-21 MED ORDER — OXYCODONE HCL 15 MG PO TABS: 15.0000 mg | ORAL_TABLET | Freq: Three times a day (TID) | ORAL | 0 refills | Status: DC | PRN

## 2018-12-21 MED ORDER — LENALIDOMIDE 5 MG PO CAPS CTSU E1A11: ORAL_CAPSULE | ORAL | 0 refills | Status: DC

## 2018-12-21 NOTE — Assessment & Plan Note (Addendum)
She have chronic bone pain which started with a diagnosis of multiple myeloma. We discussed chronic pain management. MRI did suggest she had evidence of degenerative disc disease and spinal stenosis. Previously, we have extensive disease and the patient is comfortable to remain on chronic narcotic prescription. I refilled her prescriptions We discussed narcotic refill policy

## 2018-12-21 NOTE — Assessment & Plan Note (Signed)
She believes the diarrhea could be due to previous medication.  It has improved recently.

## 2018-12-21 NOTE — Assessment & Plan Note (Signed)
This is likely anemia of chronic disease. The patient denies recent history of bleeding such as epistaxis, hematuria or hematochezia. She is asymptomatic from the anemia. We will observe for now.  

## 2018-12-21 NOTE — Progress Notes (Signed)
[place] OFFICE PROGRESS NOTE  Patient Care Team: Wenda Low, MD as PCP - General (Internal Medicine) Heath Lark, MD as Consulting Physician (Hematology and Oncology) Benson Norway, RN as Registered Nurse (Oncology)  ASSESSMENT & PLAN:  Multiple myeloma in remission [facility]) I reviewed test results with her  So far, her last bone marrow biopsy and recent blood work confirmed complete remission. Skeletal survey did not show any new lesions The patient is comfortable to remain on Revlimid indefinitely. She will continue treatment per research protocol She will continue calcium with vitamin D supplement along with aspirin for DVT prophylaxis. 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 I will see her back as scheduled  Anemia in neoplastic disease This is likely anemia of chronic disease. The patient denies recent history of bleeding such as epistaxis, hematuria or hematochezia. She is asymptomatic from the anemia. We will observe for now.   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 We discussed chronic pain management. MRI did suggest she had evidence of degenerative disc disease and spinal stenosis. Previously, we have extensive disease and the patient is comfortable to remain on chronic narcotic prescription. I refilled her prescriptions We discussed narcotic refill policy  Diarrhea She believes the diarrhea could be due to previous medication.  It has improved recently.   No orders of the defined types were placed in this encounter.   INTERVAL HISTORY: Please see below for problem oriented charting. She returns with her husband for further follow-up She has intermittent fluid retention that comes and goes She is currently on antidiabetic medication with her primary care doctor. She denies recent infection, fever or chills Her chronic joint pain and bone pain are  stable.  SUMMARY OF ONCOLOGIC HISTORY: Oncology History   ISS stage 1 IgG lambda subtype (serum albumin 3.6, Beta2 microglobulin 2.32) Durie Salmon Stage 1     Multiple myeloma in remission ([place])   10/10/2013 Imaging    Skeletal survery was negative    11/09/2013 Bone Marrow Biopsy    BM biopsy confirmed myeloma, 76% involved, IgG lambda subtype    12/06/2013 - 08/29/2014 Chemotherapy    Sh received chemo with revlimid, Velcade, Dexamethasone and Zometa. Patient particpated in clinical research [facility] [phone]    02/23/2014 Bone Marrow Biopsy    Repeat bone marrow biopsy showed 5% involvement    03/31/2014 Adverse Reaction    Zometa was discontinued due to osteonecrosis of the jaw.    05/05/2014 Imaging    Imaging study of the neck showed no explanation that could cause right neck pain. She is noted to have incidental left upper lung nodule. Plan to repeat imaging study in 3 months.    09/06/2014 Imaging    Bone survey showed no evidence of fracture    09/14/2014 Bone Marrow Biopsy    Bone marrow biopsy showed 8% residual plasma cells by manual count but none on the biopsy specimen    09/26/2014 -  Chemotherapy    She is started on cycle 1 of maintenance Revlimid    09/26/2014 -  Chemotherapy    The patient had [No matching medication found in this treatment plan]  for chemotherapy treatment.     01/31/2015 Imaging     chest x-ray showed pneumonia. Treatment was placed on hold.    05/03/2015 Bone Marrow Biopsy    Accession: [id number] repeat bone marrow aspirate and biopsy show 5%  residual plasma cells    10/14/2016 Bone Marrow Biopsy    Bone marrow biopsy showed the plasma cells represent 4% of all cells with lack of large aggregates or sheets. To assess the plasma cell clonality, immunohistochemical stains is performed and it lack clonality. Normal cytogenetics and FISH    01/09/2017 Imaging    CT chest showed ground-glass 1.5 cm apical left upper lobe pulmonary nodule. Initial  follow-up with CT at 6-12 months is recommended to confirm persistence. If persistent, repeat CT is recommended every 2 years until 5 years of stability has been established. This recommendation follows the consensus statement: Guidelines for Management of Incidental Pulmonary Nodules Detected on CT Images: From the Fleischner Society 2017; Radiology 2017; 284:228-243. 2. Mild patchy ground-glass opacities in the right upper lobe, probably inflammatory, which can also be reassessed on follow-up chest CT performed for the above dominant ground-glass nodule. 3. Solitary 3 mm right lower lobe solid pulmonary nodule, which can also be reassessed on follow-up chest CT performed for the above dominant ground-glass nodule. 4. No thoracic adenopathy. 5. Aortic atherosclerosis. Two-vessel coronary atherosclerosis.    06/30/2017 Imaging    1. No interval change in the 11 x 13 mm ground-glass nodule left upper lobe. Given the nearly 6 months of imaging stability, repeat CT is recommended every 2 years until 5 years of stability has been established. This recommendation follows the consensus statement: Guidelines for Management of Incidental Pulmonary Nodules Detected on CT Images: From the Fleischner Society 2017; Radiology 2017; 284:228-243. 2. Interval resolution of the tiny patchy ground-glass nodules right upper lobe, likely infectious/inflammatory etiology. 3. Stable 3 mm right lower lobe pulmonary nodule. Attention on follow-up recommended. 4. Aortic Atherosclerois (ICD10-170.0)    09/29/2017 Imaging    Skeletal survey 1. Questionable new tiny lucency noted the posterior portion of C3 vertebral body. This may represent a small lytic lesion.  2. No definite thoracic lesion noted on today's exam. Stable lucencies in the left ilium and acetabulum.  3. No other focal abnormalities identified. The left hip is unremarkable .     REVIEW OF SYSTEMS:   Constitutional: Denies fevers, chills or abnormal  weight loss Eyes: Denies blurriness of vision Ears, nose, mouth, throat, and face: Denies mucositis or sore throat Respiratory: Denies cough, dyspnea or wheezes Cardiovascular: Denies palpitation, chest discomfort or lower extremity swelling Gastrointestinal:  Denies nausea, heartburn or change in bowel habits Skin: Denies abnormal skin rashes Lymphatics: Denies new lymphadenopathy or easy bruising Neurological:Denies numbness, tingling or new weaknesses Behavioral/Psych: Mood is stable, no new changes  All other systems were reviewed with the patient and are negative.  I have reviewed the past medical history, past surgical history, social history and family history with the patient and they are unchanged from previous note.  ALLERGIES:  has No Known Allergies.  MEDICATIONS:  Current Outpatient Medications  Medication Sig Dispense Refill  . aspirin 325 MG tablet Take 325 mg by mouth daily.     . Cholecalciferol (VITAMIN D3) 2000 UNITS TABS Take 2,000 Units by mouth daily.    . hydrochlorothiazide (HYDRODIURIL) 25 MG tablet Take 25 mg by mouth every evening.     Marland Kitchen lenalidomide (REVLIMID) 5 MG capsule TAKE 1 CAPSULE BY MOUTH  EVERY OTHER DAY FOR 21 DAYS ON, THEN 7 DAYS OFF 11 capsule 0  . levothyroxine (SYNTHROID, LEVOTHROID) 75 MCG tablet Take 75 mcg by mouth daily before breakfast.    . methocarbamol (ROBAXIN) 500 MG tablet Take 1 tablet (500 mg total) by  mouth every 8 (eight) hours as needed for muscle spasms. 90 tablet 1  . Multiple Vitamins-Minerals (CENTRUM SILVER PO) Take 1 tablet by mouth daily.     Marland Kitchen oxyCODONE (ROXICODONE) 15 MG immediate release tablet Take 1 tablet (15 mg total) by mouth 3 (three) times daily as needed for pain. 90 tablet 0  . polyethylene glycol (MIRALAX / GLYCOLAX) packet Take 17 g by mouth daily as needed for mild constipation.     . RESTASIS 0.05 % ophthalmic emulsion PLACE 1 DROP IN BOTH EYES IN THE EVENING  4  . sitaGLIPtin (JANUVIA) 50 MG tablet Take 50  mg by mouth daily.    . traZODone (DESYREL) 50 MG tablet TAKE 1 TABLET (50 MG TOTAL) BY MOUTH AT BEDTIME. 90 tablet 3  . venlafaxine XR (EFFEXOR-XR) 150 MG 24 hr capsule Take 1 capsule (150 mg total) by mouth daily with breakfast. 90 capsule 1  . verapamil (VERELAN PM) 120 MG 24 hr capsule TAKE 1 CAPSULE BY MOUTH EVERY DAY 90 capsule 2   No current facility-administered medications for this visit.    Facility-Administered Medications Ordered in Other Visits  Medication Dose Route Frequency Provider Last Rate Last Dose  . gadopentetate dimeglumine (MAGNEVIST) injection 15 mL  15 mL Intravenous Once PRN Melvenia Beam, MD        PHYSICAL EXAMINATION: ECOG PERFORMANCE STATUS: 1 - Symptomatic but completely ambulatory  Vitals:   12/21/18 0803  BP: 125/72  Pulse: 64  Resp: 18  Temp: 98 F (36.7 C)  SpO2: 100%   Filed Weights   12/21/18 0803  Weight: 170 lb 9.6 oz (77.4 kg)    GENERAL:alert, no distress and comfortable SKIN: skin color, texture, turgor are normal, no rashes or significant lesions EYES: normal, Conjunctiva are pink and non-injected, sclera clear OROPHARYNX:no exudate, no erythema and lips, buccal mucosa, and tongue normal  NECK: supple, thyroid normal size, non-tender, without nodularity LYMPH:  no palpable lymphadenopathy in the cervical, axillary or inguinal LUNGS: clear to auscultation and percussion with normal breathing effort HEART: regular rate & rhythm and no murmurs and no lower extremity edema ABDOMEN:abdomen soft, non-tender and normal bowel sounds Musculoskeletal:no cyanosis of digits and no clubbing  NEURO: alert & oriented x 3 with fluent speech, no focal motor/sensory deficits  LABORATORY DATA:  I have reviewed the data as listed    Component Value Date/Time   NA 140 12/14/2018 0747   NA 141 09/29/2017 0830   K 3.7 12/14/2018 0747   K 3.6 09/29/2017 0830   CL 106 12/14/2018 0747   CO2 27 12/14/2018 0747   CO2 29 09/29/2017 0830   GLUCOSE  89 12/14/2018 0747   GLUCOSE 87 09/29/2017 0830   BUN 12 12/14/2018 0747   BUN 14.0 09/29/2017 0830   CREATININE 0.94 12/14/2018 0747   CREATININE 0.9 09/29/2017 0830   CALCIUM 9.4 12/14/2018 0747   CALCIUM 9.6 09/29/2017 0830   PROT 7.7 12/14/2018 0747   PROT 7.3 09/29/2017 0832   PROT 8.0 09/29/2017 0830   ALBUMIN 3.8 12/14/2018 0747   ALBUMIN 3.8 09/29/2017 0830   AST 21 12/14/2018 0747   AST 21 09/29/2017 0830   ALT 32 12/14/2018 0747   ALT 69 (H) 09/29/2017 0830   ALKPHOS 54 12/14/2018 0747   ALKPHOS 60 09/29/2017 0830   BILITOT 0.6 12/14/2018 0747   BILITOT 0.46 09/29/2017 0830   GFRNONAA >60 12/14/2018 0747   GFRAA >60 12/14/2018 0747    No results found for: SPEP, UPEP  Lab Results  Component Value Date   WBC 4.0 12/14/2018   NEUTROABS 1.2 (L) 12/14/2018   HGB 11.8 (L) 12/14/2018   HCT 38.5 12/14/2018   MCV 74.9 (L) 12/14/2018   PLT 209 12/14/2018      Chemistry      Component Value Date/Time   NA 140 12/14/2018 0747   NA 141 09/29/2017 0830   K 3.7 12/14/2018 0747   K 3.6 09/29/2017 0830   CL 106 12/14/2018 0747   CO2 27 12/14/2018 0747   CO2 29 09/29/2017 0830   BUN 12 12/14/2018 0747   BUN 14.0 09/29/2017 0830   CREATININE 0.94 12/14/2018 0747   CREATININE 0.9 09/29/2017 0830      Component Value Date/Time   CALCIUM 9.4 12/14/2018 0747   CALCIUM 9.6 09/29/2017 0830   ALKPHOS 54 12/14/2018 0747   ALKPHOS 60 09/29/2017 0830   AST 21 12/14/2018 0747   AST 21 09/29/2017 0830   ALT 32 12/14/2018 0747   ALT 69 (H) 09/29/2017 0830   BILITOT 0.6 12/14/2018 0747   BILITOT 0.46 09/29/2017 0830       All questions were answered. The patient knows to call the clinic with any problems, questions or concerns. No barriers to learning was detected.  I spent 15 minutes counseling the patient face to face. The total time spent in the appointment was 20 minutes and more than 50% was on counseling and review of test results  Heath Lark, MD 12/21/2018 8:59  AM

## 2018-12-21 NOTE — Research (Signed)
12/21/2018 Research - [facility] ECOG-ACRIN E3X54 Maintenance Cycle 52 Patient in to clinic today accompanied by her husband, Arnell Sieving, for evaluation prior to beginning treatment Cycle 52. Patient reports ongoing symptoms, similar to previous cycles. Based on lab results review and history and physical by Dr. Alvy Bimler, patient meets criteria for continued treatment with Revlimid. Patient returned completed cycle 51 Medication Calendar, confirming dosing at dose level -3, Revlimid 5mg  every other day, Days 1-21, with no missed doses. Patient given printed appointment calendar with Cycle 52 treatment days marked, to document doses for the next treatment cycle, which will begin today. Patient is aware that she should contact the office at any time for any symptoms or problems of concern. [name] BSN, RN, CCRP 12/21/2018 8:57 AM

## 2018-12-21 NOTE — Assessment & Plan Note (Signed)
I reviewed test results with her  So far, her last bone marrow biopsy and recent blood work confirmed complete remission. Skeletal survey did not show any new lesions The patient is comfortable to remain on Revlimid indefinitely. She will continue treatment per research protocol She will continue calcium with vitamin D supplement along with aspirin for DVT prophylaxis. 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 I will see her back as scheduled 

## 2018-12-21 NOTE — Telephone Encounter (Signed)
Gave avs and calendar ° °

## 2019-01-06 ENCOUNTER — Other Ambulatory Visit: Payer: Self-pay | Admitting: Hematology and Oncology

## 2019-01-06 DIAGNOSIS — C9001 Multiple myeloma in remission: Secondary | ICD-10-CM

## 2019-01-13 ENCOUNTER — Other Ambulatory Visit: Payer: Self-pay

## 2019-01-13 DIAGNOSIS — C9001 Multiple myeloma in remission: Secondary | ICD-10-CM

## 2019-01-13 MED ORDER — LENALIDOMIDE 5 MG PO CAPS: ORAL_CAPSULE | ORAL | 0 refills | Status: DC

## 2019-01-17 NOTE — Research (Signed)
01/17/2019 Adverse Event Log Study/Protocol: [facility] ECOG E1A11 Maintenance Cycles 49-51: 09/28/2018 - 12/21/2018 (end of Cycle 51 = 12/20/2018) Event Grade Attribution to lenalidomide Cycle # Comments  Fatigue  Grade 2  Probable 49-51 Moderate, occasionally limiting ADLs (unchanged from previous)  Sensory neuropathy  Grade 2 Unlikely 49-51 Moderate symptoms, unchanged from previous  Insomnia  Grade 2  Unlikely 49-51 Moderate difficulty, unchanged  Neutrophil count decreased Grade 2  Definite 49-51    Anemia Grade 1 Definite 49-51    Diarrhea Grade 1 Probable 49-50 2-3/day over baseline, per patient, felt to be related to diabetes medication and diet (vegetables).  Irritability Grade 2 Unrelated 50-51 Related to venlafaxine withdrawal while waiting for new Rx in C-50; ongoing mild irritability in C-51  Pedal edema Grade 1 Unrelated 51   Non-reportable AEs - unsolicited with Grade <3 (grade noted in parentheses): - Leukopenia (1) - Moderate pain (2)  Back pain  Bone pain  Leg pain  Shoulder pain - Neck & back stiffness (2) - Weight loss (1) - Lightheadedness (1) - Forgetfulness (2) - Legs tingling (2) [name] BSN, RN, CCRP 01/17/2019 10:48 AM

## 2019-01-18 ENCOUNTER — Encounter: Payer: Self-pay | Admitting: Hematology and Oncology

## 2019-01-18 ENCOUNTER — Inpatient Hospital Stay: Payer: Medicare Other | Admitting: *Deleted

## 2019-01-18 ENCOUNTER — Inpatient Hospital Stay: Payer: Medicare Other | Attending: Hematology and Oncology

## 2019-01-18 DIAGNOSIS — C9001 Multiple myeloma in remission: Secondary | ICD-10-CM

## 2019-01-18 DIAGNOSIS — Z006 Encounter for examination for normal comparison and control in clinical research program: Secondary | ICD-10-CM | POA: Diagnosis present

## 2019-01-18 LAB — CBC WITH DIFFERENTIAL (CANCER CENTER ONLY)
Abs Immature Granulocytes: 0.01 10*3/uL (ref 0.00–0.07)
Basophils Absolute: 0 10*3/uL (ref 0.0–0.1)
Basophils Relative: 1 %
Eosinophils Absolute: 0.1 10*3/uL (ref 0.0–0.5)
Eosinophils Relative: 2 %
HCT: 36.5 % (ref 36.0–46.0)
Hemoglobin: 11.3 g/dL — ABNORMAL LOW (ref 12.0–15.0)
Immature Granulocytes: 0 %
Lymphocytes Relative: 57 %
Lymphs Abs: 2.1 10*3/uL (ref 0.7–4.0)
MCH: 23 pg — ABNORMAL LOW (ref 26.0–34.0)
MCHC: 31 g/dL (ref 30.0–36.0)
MCV: 74.2 fL — ABNORMAL LOW (ref 80.0–100.0)
Monocytes Absolute: 0.5 10*3/uL (ref 0.1–1.0)
Monocytes Relative: 14 %
Neutro Abs: 1 10*3/uL — ABNORMAL LOW (ref 1.7–7.7)
Neutrophils Relative %: 26 %
Platelet Count: 186 10*3/uL (ref 150–400)
RBC: 4.92 MIL/uL (ref 3.87–5.11)
RDW: 14.8 % (ref 11.5–15.5)
WBC Count: 3.8 10*3/uL — ABNORMAL LOW (ref 4.0–10.5)
nRBC: 0 % (ref 0.0–0.2)

## 2019-01-18 MED ORDER — LENALIDOMIDE 5 MG PO CAPS CTSU E1A11: ORAL_CAPSULE | ORAL | 0 refills | Status: DC

## 2019-01-18 NOTE — Research (Signed)
01/18/2019 Research - [facility] ECOG-ACRIN E1A11 Maintenance Cycle 52  Adverse Event Log Study/Protocol: [facility] ECOG E1A11 Maintenance Cycle 52: 12/21/2018- 01/18/2019 (end of Cycle = 5/78/4696) Solicited  Events Grade Comments  Anemia Grade 1    Hyperglycemia n/a Not assessed this cycle  Lymphocyte count decreased Grade 0   Neutrophil count decreased Grade 2     Platelet count decreased Grade 0     Diarrhea Grade 1 Mild, maximum 3 stools/24 hours  Dyspnea Grade 0   Edema: limbs Grade 0   Fatigue  Grade 2  Moderate, occasionally limiting ADLs (unchanged from previous)  Fever Grade 0   Insomnia  Grade 2  Moderate difficulty; regular use of sleeping pill  Irritability Grade 0   Muscle weakness trunk Grade 0   Nausea Grade 0   Peripheral motor neuropathy Grade 0   Peripheral sensory neuropathy  Grade 2 Moderate symptoms, unchanged from previous  Rash acneiform Grade 0   Vomiting Grade 0   Non-reportable AEs - unsolicited, < Grade 3 (grade):  - Leukopenia (1) - Moderate pain (2)  Back pain  Bone pain - Headache (2) [name] BSN, RN, CCRP 01/18/2019 11:13 AM

## 2019-01-18 NOTE — Research (Signed)
01/18/2019 Research - [facility] ECOG-ACRIN [id number] Maintenance Cycle 52 Patient in to clinic unaccompanied today for evaluation prior to beginning treatment Cycle 53. Patient returned completed cycle 52 Medication Calendar, confirming dosing at dose level -3, Revlimid 5mg  every other day, Days 1-21, with no missed doses. Patient given printed appointment calendar with Cycle 53 treatment days marked, to document doses for the next treatment cycle, which will begin today. Patient reports occasional diarrhea, with a maximum of 3 stools per day, occurring about 4 days this past month. She states that previously reported irritability has resolved with none noted this month. Otherwise, patient has her usual complaints of back and bone pain, as well as ongoing fatigue and insomnia, all of which are essentially unchanged in character or intensity. Sensory neuropathy is unchanged from previous, with no reports of motor neuropathy. Patient is aware that she should contact the office for any symptoms or problems of concern, and that on-call staff are available 24/7 throughout the holiday. Patient voiced understanding.  Prior to patient's departure, lab results were reviewed noting values within parameters for retreatment. Patient was advised of such and will resume treatment for cycle 53 beginning today. Patient states that her Revlimid prescription is expected to arrive today, however, she does have 2 remaining doses from last month, as she continues to dose with capsules from prior month (related to missed or omitted doses from earlier cycles). [name] BSN, RN, [facility] 01/18/2019 8:17 AM

## 2019-02-06 ENCOUNTER — Other Ambulatory Visit: Payer: Self-pay | Admitting: Hematology and Oncology

## 2019-02-06 DIAGNOSIS — C9001 Multiple myeloma in remission: Secondary | ICD-10-CM

## 2019-02-15 ENCOUNTER — Encounter: Payer: Self-pay | Admitting: Hematology and Oncology

## 2019-02-15 ENCOUNTER — Inpatient Hospital Stay: Payer: Medicare Other | Admitting: *Deleted

## 2019-02-15 ENCOUNTER — Other Ambulatory Visit: Payer: Self-pay

## 2019-02-15 ENCOUNTER — Inpatient Hospital Stay: Payer: Medicare Other | Attending: Hematology and Oncology

## 2019-02-15 DIAGNOSIS — C9001 Multiple myeloma in remission: Secondary | ICD-10-CM

## 2019-02-15 LAB — CBC WITH DIFFERENTIAL (CANCER CENTER ONLY)
Abs Immature Granulocytes: 0.02 10*3/uL (ref 0.00–0.07)
Basophils Absolute: 0 10*3/uL (ref 0.0–0.1)
Basophils Relative: 1 %
Eosinophils Absolute: 0.1 10*3/uL (ref 0.0–0.5)
Eosinophils Relative: 3 %
HCT: 38.8 % (ref 36.0–46.0)
Hemoglobin: 11.8 g/dL — ABNORMAL LOW (ref 12.0–15.0)
Immature Granulocytes: 1 %
Lymphocytes Relative: 58 %
Lymphs Abs: 2.4 10*3/uL (ref 0.7–4.0)
MCH: 23.1 pg — ABNORMAL LOW (ref 26.0–34.0)
MCHC: 30.4 g/dL (ref 30.0–36.0)
MCV: 75.9 fL — ABNORMAL LOW (ref 80.0–100.0)
Monocytes Absolute: 0.5 10*3/uL (ref 0.1–1.0)
Monocytes Relative: 12 %
Neutro Abs: 1 10*3/uL — ABNORMAL LOW (ref 1.7–7.7)
Neutrophils Relative %: 25 %
Platelet Count: 195 10*3/uL (ref 150–400)
RBC: 5.11 MIL/uL (ref 3.87–5.11)
RDW: 14.9 % (ref 11.5–15.5)
WBC Count: 4.1 10*3/uL (ref 4.0–10.5)
nRBC: 0 % (ref 0.0–0.2)

## 2019-02-15 MED ORDER — LENALIDOMIDE 5 MG PO CAPS CTSU E1A11: ORAL_CAPSULE | ORAL | 0 refills | Status: DC

## 2019-02-15 NOTE — Research (Signed)
02/15/2019   Research - [facility] ECOG-ACRIN [id number] Maintenance Cycle 54 Patient into clinic unaccompanied this morning for evaluation prior to beginning treatment Cycle 54. Patient returned completed cycle 53 Medication Calendar, confirming dosing at dose level -3, Revlimid 5mg  every other day, Days 1-21, with no missed doses. Patient given printed appointment calendar with Cycle 54 treatment days marked, to document doses for the next treatment cycle, which will begin today.  Patient reports side effects over the past month including mild irritability as well as her usual complaints of back and bone pain, ongoing fatigue, insomnia, and peripheral sensory neuropathy, all of which are essentially unchanged in character or intensity. She reports mild symptoms of depression, but does not feel that she needs medication, nor is she considering harming herself. She does not believe it is situational related to the current pandemic. Patient is aware that she should contact the office for any symptoms or problems of concern, and that on-call staff are available 24/7 throughout the holiday. Patient voiced understanding. Patient was counseled on signs and symptoms of COVID-19 infection, how to report to healthcare provider (call first for assessment) and strategies to avoid infection.   Prior to patient's departure, lab results were reviewed noting values within parameters for retreatment. Patient was advised of such and will resume treatment for cycle 54 beginning today. Per patient's request, 03/08/2019 lab appointment moved to 9:30am; she will not need to see research nurse at that visit, but will return her medication diary at her Cycle 55 office visit on 03/15/2019.   Adverse Event Log Study/Protocol: [facility] ECOG E1A11 Maintenance Cycle 53: 01/18/2019 - 02/15/2019 (end of Cycle = 2/44/0102) Solicited  Events Grade Comments  Anemia Grade 1    Hyperglycemia n/a Not assessed this cycle  Lymphocyte count decreased Grade 0    Neutrophil count decreased Grade 2     Platelet count decreased Grade 0     Diarrhea Grade 0   Dyspnea Grade 0   Edema: limbs Grade 0   Fatigue  Grade 2  Moderate, occasionally limiting ADLs (unchanged from previous)  Fever Grade 0   Insomnia  Grade 2  Moderate difficulty; regular use of sleeping pill  Irritability Grade 1 Mild symptoms  Muscle weakness trunk Grade 0   Nausea Grade 0   Peripheral motor neuropathy Grade 0   Peripheral sensory neuropathy  Grade 2 Moderate symptoms, unchanged from previous  Rash acneiform Grade 0   Vomiting Grade 0   Non-reportable AEs (unsolicited, < Grade3 ): Mild depression [grade 1] Moderate pain [grade 2]  Back pain  Bone pain  [name] BSN, RN, [place] 02/15/2019 2:17 PM

## 2019-02-28 ENCOUNTER — Other Ambulatory Visit: Payer: Self-pay | Admitting: Hematology and Oncology

## 2019-02-28 DIAGNOSIS — C9001 Multiple myeloma in remission: Secondary | ICD-10-CM

## 2019-03-08 ENCOUNTER — Inpatient Hospital Stay: Payer: Medicare Other | Attending: Hematology and Oncology

## 2019-03-08 ENCOUNTER — Other Ambulatory Visit: Payer: Self-pay

## 2019-03-08 DIAGNOSIS — C9001 Multiple myeloma in remission: Secondary | ICD-10-CM | POA: Diagnosis not present

## 2019-03-08 DIAGNOSIS — Z006 Encounter for examination for normal comparison and control in clinical research program: Secondary | ICD-10-CM | POA: Diagnosis present

## 2019-03-08 DIAGNOSIS — D61811 Other drug-induced pancytopenia: Secondary | ICD-10-CM | POA: Insufficient documentation

## 2019-03-08 DIAGNOSIS — G893 Neoplasm related pain (acute) (chronic): Secondary | ICD-10-CM | POA: Diagnosis not present

## 2019-03-08 DIAGNOSIS — Z79899 Other long term (current) drug therapy: Secondary | ICD-10-CM | POA: Diagnosis not present

## 2019-03-08 LAB — CMP (CANCER CENTER ONLY)
ALT: 29 U/L (ref 0–44)
AST: 23 U/L (ref 15–41)
Albumin: 3.8 g/dL (ref 3.5–5.0)
Alkaline Phosphatase: 52 U/L (ref 38–126)
Anion gap: 7 (ref 5–15)
BUN: 14 mg/dL (ref 8–23)
CO2: 29 mmol/L (ref 22–32)
Calcium: 9.2 mg/dL (ref 8.9–10.3)
Chloride: 105 mmol/L (ref 98–111)
Creatinine: 0.99 mg/dL (ref 0.44–1.00)
GFR, Est AFR Am: >60 mL/min (ref >60)
GFR, Estimated: >60 mL/min (ref >60)
Glucose, Bld: 85 mg/dL (ref 70–99)
Potassium: 3.7 mmol/L (ref 3.5–5.1)
Sodium: 141 mmol/L (ref 135–145)
Total Bilirubin: 0.5 mg/dL (ref 0.3–1.2)
Total Protein: 7.8 g/dL (ref 6.5–8.1)

## 2019-03-08 LAB — CBC WITH DIFFERENTIAL (CANCER CENTER ONLY)
Abs Immature Granulocytes: 0.01 10*3/uL (ref 0.00–0.07)
Basophils Absolute: 0 10*3/uL (ref 0.0–0.1)
Basophils Relative: 0 %
Eosinophils Absolute: 0.1 10*3/uL (ref 0.0–0.5)
Eosinophils Relative: 3 %
HCT: 40.2 % (ref 36.0–46.0)
Hemoglobin: 12.2 g/dL (ref 12.0–15.0)
Immature Granulocytes: 0 %
Lymphocytes Relative: 51 %
Lymphs Abs: 1.7 10*3/uL (ref 0.7–4.0)
MCH: 22.7 pg — ABNORMAL LOW (ref 26.0–34.0)
MCHC: 30.3 g/dL (ref 30.0–36.0)
MCV: 74.9 fL — ABNORMAL LOW (ref 80.0–100.0)
Monocytes Absolute: 0.4 10*3/uL (ref 0.1–1.0)
Monocytes Relative: 13 %
Neutro Abs: 1.1 10*3/uL — ABNORMAL LOW (ref 1.7–7.7)
Neutrophils Relative %: 33 %
Platelet Count: 189 10*3/uL (ref 150–400)
RBC: 5.37 MIL/uL — ABNORMAL HIGH (ref 3.87–5.11)
RDW: 15.2 % (ref 11.5–15.5)
WBC Count: 3.4 10*3/uL — ABNORMAL LOW (ref 4.0–10.5)
nRBC: 0 % (ref 0.0–0.2)

## 2019-03-08 LAB — LACTATE DEHYDROGENASE: LDH: 175 U/L (ref 98–192)

## 2019-03-08 LAB — TSH: TSH: 1.339 u[IU]/mL (ref 0.308–3.960)

## 2019-03-09 LAB — MULTIPLE MYELOMA PANEL, SERUM
Albumin SerPl Elph-Mcnc: 3.7 g/dL (ref 2.9–4.4)
Albumin/Glob SerPl: 1.1 (ref 0.7–1.7)
Alpha 1: 0.2 g/dL (ref 0.0–0.4)
Alpha2 Glob SerPl Elph-Mcnc: 0.6 g/dL (ref 0.4–1.0)
B-Globulin SerPl Elph-Mcnc: 1.1 g/dL (ref 0.7–1.3)
Gamma Glob SerPl Elph-Mcnc: 1.7 g/dL (ref 0.4–1.8)
Globulin, Total: 3.5 g/dL (ref 2.2–3.9)
IgA: 137 mg/dL (ref 87–352)
IgG (Immunoglobin G), Serum: 1965 mg/dL — ABNORMAL HIGH (ref 586–1602)
IgM (Immunoglobulin M), Srm: 73 mg/dL (ref 26–217)
Total Protein ELP: 7.2 g/dL (ref 6.0–8.5)

## 2019-03-09 LAB — KAPPA/LAMBDA LIGHT CHAINS
Kappa free light chain: 23.6 mg/L — ABNORMAL HIGH (ref 3.3–19.4)
Kappa, lambda light chain ratio: 1.33 (ref 0.26–1.65)
Lambda free light chains: 17.7 mg/L (ref 5.7–26.3)

## 2019-03-10 ENCOUNTER — Encounter: Payer: Self-pay | Admitting: *Deleted

## 2019-03-15 ENCOUNTER — Inpatient Hospital Stay (HOSPITAL_BASED_OUTPATIENT_CLINIC_OR_DEPARTMENT_OTHER): Payer: Medicare Other | Admitting: Hematology and Oncology

## 2019-03-15 ENCOUNTER — Inpatient Hospital Stay: Payer: Medicare Other | Admitting: *Deleted

## 2019-03-15 ENCOUNTER — Encounter: Payer: Self-pay | Admitting: Hematology and Oncology

## 2019-03-15 ENCOUNTER — Other Ambulatory Visit: Payer: Self-pay

## 2019-03-15 ENCOUNTER — Telehealth: Payer: Self-pay | Admitting: *Deleted

## 2019-03-15 DIAGNOSIS — G893 Neoplasm related pain (acute) (chronic): Secondary | ICD-10-CM

## 2019-03-15 DIAGNOSIS — Z006 Encounter for examination for normal comparison and control in clinical research program: Secondary | ICD-10-CM

## 2019-03-15 DIAGNOSIS — C9001 Multiple myeloma in remission: Secondary | ICD-10-CM

## 2019-03-15 DIAGNOSIS — D61811 Other drug-induced pancytopenia: Secondary | ICD-10-CM | POA: Diagnosis not present

## 2019-03-15 MED ORDER — LENALIDOMIDE 5 MG PO CAPS CTSU E1A11: ORAL_CAPSULE | ORAL | 0 refills | Status: DC

## 2019-03-15 MED ORDER — OXYCODONE HCL 15 MG PO TABS: 15.0000 mg | ORAL_TABLET | Freq: Three times a day (TID) | ORAL | 0 refills | Status: DC | PRN

## 2019-03-15 NOTE — Assessment & Plan Note (Signed)
She have chronic bone pain which started with a diagnosis of multiple myeloma. We discussed chronic pain management. MRI did suggest she had evidence of degenerative disc disease and spinal stenosis. Previously, we have extensive disease and the patient is comfortable to remain on chronic narcotic prescription. I refilled her prescriptions We discussed narcotic refill policy

## 2019-03-15 NOTE — Telephone Encounter (Signed)
Called and spoke with the regarding her appts for May/June. Explained that the appts are in my chart and available to view. Patient verbalized understanding.

## 2019-03-15 NOTE — Assessment & Plan Note (Signed)
I reviewed test results with her  Due to the virus pandemic, we discussed the risk and benefits of continuing treatment. The patient is comfortable to remain on Revlimid indefinitely. She will continue treatment per research protocol She will continue calcium with vitamin D supplement along with aspirin for DVT prophylaxis. 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 I will see her back as scheduled

## 2019-03-15 NOTE — Assessment & Plan Note (Signed)
This is likely due to recent treatment. The patient denies recent history of fevers, cough, chills, diarrhea or dysuria. She is asymptomatic from the leukopenia. I will observe for now.  I will continue the chemotherapy at current dose without dosage adjustment.  If the leukopenia gets progressive worse in the future, I might have to delay her treatment or adjust the chemotherapy dose per protocol

## 2019-03-15 NOTE — Progress Notes (Signed)
[facility] OFFICE PROGRESS NOTE  Patient Care Team: Wenda Low, MD as PCP - General (Internal Medicine) Heath Lark, MD as Consulting Physician (Hematology and Oncology) Benson Norway, RN as Registered Nurse (Oncology)  ASSESSMENT & PLAN:  Multiple myeloma in remission [facility]) I reviewed test results with her  Due to the virus pandemic, we discussed the risk and benefits of continuing treatment. The patient is comfortable to remain on Revlimid indefinitely. She will continue treatment per research protocol She will continue calcium with vitamin D supplement along with aspirin for DVT prophylaxis. 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 I will see her back as scheduled  Drug-induced pancytopenia ([place]) This is likely due to recent treatment. The patient denies recent history of fevers, cough, chills, diarrhea or dysuria. She is asymptomatic from the leukopenia. I will observe for now.  I will continue the chemotherapy at current dose without dosage adjustment.  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 We discussed chronic pain management. MRI did suggest she had evidence of degenerative disc disease and spinal stenosis. Previously, we have extensive disease and the patient is comfortable to remain on chronic narcotic prescription. I refilled her prescriptions We discussed narcotic refill policy   No orders of the defined types were placed in this encounter.   INTERVAL HISTORY: Please see below for problem oriented charting. She returns for treatment follow-up Since last time I saw her, her symptoms are about the same She continues to have diffuse musculoskeletal pain She continues to exercise on a regular basis She denies new  treatment side effects Complain of some mild fatigue Occasional diarrhea. No recent infection, fever or chills.  SUMMARY OF ONCOLOGIC HISTORY: Oncology History   ISS stage 1 IgG lambda subtype (serum albumin 3.6, Beta2 microglobulin 2.32) Durie Salmon Stage 1     Multiple myeloma in remission ([place])   10/10/2013 Imaging    Skeletal survery was negative    11/09/2013 Bone Marrow Biopsy    BM biopsy confirmed myeloma, 76% involved, IgG lambda subtype    12/06/2013 - 08/29/2014 Chemotherapy    Sh received chemo with revlimid, Velcade, Dexamethasone and Zometa. Patient particpated in clinical research [facility] [phone]    02/23/2014 Bone Marrow Biopsy    Repeat bone marrow biopsy showed 5% involvement    03/31/2014 Adverse Reaction    Zometa was discontinued due to osteonecrosis of the jaw.    05/05/2014 Imaging    Imaging study of the neck showed no explanation that could cause right neck pain. She is noted to have incidental left upper lung nodule. Plan to repeat imaging study in 3 months.    09/06/2014 Imaging    Bone survey showed no evidence of fracture    09/14/2014 Bone Marrow Biopsy    Bone marrow biopsy showed 8% residual plasma cells by manual count but none on the biopsy specimen    09/26/2014 -  Chemotherapy    She is started on cycle 1 of maintenance Revlimid    09/26/2014 -  Chemotherapy    The patient had [No matching medication found in this treatment plan]  for chemotherapy treatment.     01/31/2015 Imaging     chest x-ray showed pneumonia. Treatment was placed on hold.    05/03/2015  Bone Marrow Biopsy    Accession: [id number] repeat bone marrow aspirate and biopsy show 5% residual plasma cells    10/14/2016 Bone Marrow Biopsy    Bone marrow biopsy showed the plasma cells represent 4% of all cells with lack of large aggregates or sheets. To assess the plasma cell clonality, immunohistochemical stains is performed and it lack clonality. Normal cytogenetics and FISH     01/09/2017 Imaging    CT chest showed ground-glass 1.5 cm apical left upper lobe pulmonary nodule. Initial follow-up with CT at 6-12 months is recommended to confirm persistence. If persistent, repeat CT is recommended every 2 years until 5 years of stability has been established. This recommendation follows the consensus statement: Guidelines for Management of Incidental Pulmonary Nodules Detected on CT Images: From the Fleischner Society 2017; Radiology 2017; 284:228-243. 2. Mild patchy ground-glass opacities in the right upper lobe, probably inflammatory, which can also be reassessed on follow-up chest CT performed for the above dominant ground-glass nodule. 3. Solitary 3 mm right lower lobe solid pulmonary nodule, which can also be reassessed on follow-up chest CT performed for the above dominant ground-glass nodule. 4. No thoracic adenopathy. 5. Aortic atherosclerosis. Two-vessel coronary atherosclerosis.    06/30/2017 Imaging    1. No interval change in the 11 x 13 mm ground-glass nodule left upper lobe. Given the nearly 6 months of imaging stability, repeat CT is recommended every 2 years until 5 years of stability has been established. This recommendation follows the consensus statement: Guidelines for Management of Incidental Pulmonary Nodules Detected on CT Images: From the Fleischner Society 2017; Radiology 2017; 284:228-243. 2. Interval resolution of the tiny patchy ground-glass nodules right upper lobe, likely infectious/inflammatory etiology. 3. Stable 3 mm right lower lobe pulmonary nodule. Attention on follow-up recommended. 4. Aortic Atherosclerois (ICD10-170.0)    09/29/2017 Imaging    Skeletal survey 1. Questionable new tiny lucency noted the posterior portion of C3 vertebral body. This may represent a small lytic lesion.  2. No definite thoracic lesion noted on today's exam. Stable lucencies in the left ilium and acetabulum.  3. No other focal abnormalities identified. The left  hip is unremarkable .     REVIEW OF SYSTEMS:   Constitutional: Denies fevers, chills or abnormal weight loss Eyes: Denies blurriness of vision Ears, nose, mouth, throat, and face: Denies mucositis or sore throat Respiratory: Denies cough, dyspnea or wheezes Cardiovascular: Denies palpitation, chest discomfort or lower extremity swelling Gastrointestinal:  Denies nausea, heartburn or change in bowel habits Skin: Denies abnormal skin rashes Lymphatics: Denies new lymphadenopathy or easy bruising Neurological:Denies numbness, tingling or new weaknesses Behavioral/Psych: Mood is stable, no new changes  All other systems were reviewed with the patient and are negative.  I have reviewed the past medical history, past surgical history, social history and family history with the patient and they are unchanged from previous note.  ALLERGIES:  has No Known Allergies.  MEDICATIONS:  Current Outpatient Medications  Medication Sig Dispense Refill  . aspirin 325 MG tablet Take 325 mg by mouth daily.     . Cholecalciferol (VITAMIN D3) 2000 UNITS TABS Take 2,000 Units by mouth daily.    . hydrochlorothiazide (HYDRODIURIL) 25 MG tablet Take 25 mg by mouth every evening.     Marland Kitchen lenalidomide (REVLIMID) 5 MG capsule TAKE 1 CAPSULE BY MOUTH  EVERY OTHER DAY FOR 21  DAYS, THEN 7 DAYS OFF 11 capsule 0  . levothyroxine (SYNTHROID, LEVOTHROID) 75 MCG tablet Take 75 mcg by mouth daily before breakfast.    .  methocarbamol (ROBAXIN) 500 MG tablet Take 1 tablet (500 mg total) by mouth every 8 (eight) hours as needed for muscle spasms. 90 tablet 1  . Multiple Vitamins-Minerals (CENTRUM SILVER PO) Take 1 tablet by mouth daily.     Marland Kitchen oxyCODONE (ROXICODONE) 15 MG immediate release tablet Take 1 tablet (15 mg total) by mouth 3 (three) times daily as needed for pain. 90 tablet 0  . polyethylene glycol (MIRALAX / GLYCOLAX) packet Take 17 g by mouth daily as needed for mild constipation.     . RESTASIS 0.05 % ophthalmic  emulsion PLACE 1 DROP IN BOTH EYES IN THE EVENING  4  . sitaGLIPtin (JANUVIA) 50 MG tablet Take 50 mg by mouth daily.    . traZODone (DESYREL) 50 MG tablet TAKE 1 TABLET (50 MG TOTAL) BY MOUTH AT BEDTIME. 90 tablet 3  . venlafaxine XR (EFFEXOR-XR) 150 MG 24 hr capsule Take 1 capsule (150 mg total) by mouth daily with breakfast. 90 capsule 1  . verapamil (VERELAN PM) 120 MG 24 hr capsule TAKE 1 CAPSULE BY MOUTH EVERY DAY 90 capsule 2   No current facility-administered medications for this visit.    Facility-Administered Medications Ordered in Other Visits  Medication Dose Route Frequency Provider Last Rate Last Dose  . gadopentetate dimeglumine (MAGNEVIST) injection 15 mL  15 mL Intravenous Once PRN Melvenia Beam, MD        PHYSICAL EXAMINATION: ECOG PERFORMANCE STATUS: 1 - Symptomatic but completely ambulatory  Vitals:   03/15/19 0807  BP: 127/75  Pulse: 63  Resp: 18  Temp: 98.5 F (36.9 C)  SpO2: 100%   Filed Weights   03/15/19 0807  Weight: 168 lb 6.4 oz (76.4 kg)    GENERAL:alert, no distress and comfortable SKIN: skin color, texture, turgor are normal, no rashes or significant lesions EYES: normal, Conjunctiva are pink and non-injected, sclera clear OROPHARYNX:no exudate, no erythema and lips, buccal mucosa, and tongue normal  NECK: supple, thyroid normal size, non-tender, without nodularity LYMPH:  no palpable lymphadenopathy in the cervical, axillary or inguinal LUNGS: clear to auscultation and percussion with normal breathing effort HEART: regular rate & rhythm and no murmurs and no lower extremity edema ABDOMEN:abdomen soft, non-tender and normal bowel sounds Musculoskeletal:no cyanosis of digits and no clubbing  NEURO: alert & oriented x 3 with fluent speech, no focal motor/sensory deficits  LABORATORY DATA:  I have reviewed the data as listed    Component Value Date/Time   NA 141 03/08/2019 0920   NA 141 09/29/2017 0830   K 3.7 03/08/2019 0920   K 3.6  09/29/2017 0830   CL 105 03/08/2019 0920   CO2 29 03/08/2019 0920   CO2 29 09/29/2017 0830   GLUCOSE 85 03/08/2019 0920   GLUCOSE 87 09/29/2017 0830   BUN 14 03/08/2019 0920   BUN 14.0 09/29/2017 0830   CREATININE 0.99 03/08/2019 0920   CREATININE 0.9 09/29/2017 0830   CALCIUM 9.2 03/08/2019 0920   CALCIUM 9.6 09/29/2017 0830   PROT 7.8 03/08/2019 0920   PROT 7.3 09/29/2017 0832   PROT 8.0 09/29/2017 0830   ALBUMIN 3.8 03/08/2019 0920   ALBUMIN 3.8 09/29/2017 0830   AST 23 03/08/2019 0920   AST 21 09/29/2017 0830   ALT 29 03/08/2019 0920   ALT 69 (H) 09/29/2017 0830   ALKPHOS 52 03/08/2019 0920   ALKPHOS 60 09/29/2017 0830   BILITOT 0.5 03/08/2019 0920   BILITOT 0.46 09/29/2017 0830   GFRNONAA >60 03/08/2019 0920   GFRAA >  60 03/08/2019 0920    No results found for: SPEP, UPEP  Lab Results  Component Value Date   WBC 3.4 (L) 03/08/2019   NEUTROABS 1.1 (L) 03/08/2019   HGB 12.2 03/08/2019   HCT 40.2 03/08/2019   MCV 74.9 (L) 03/08/2019   PLT 189 03/08/2019      Chemistry      Component Value Date/Time   NA 141 03/08/2019 0920   NA 141 09/29/2017 0830   K 3.7 03/08/2019 0920   K 3.6 09/29/2017 0830   CL 105 03/08/2019 0920   CO2 29 03/08/2019 0920   CO2 29 09/29/2017 0830   BUN 14 03/08/2019 0920   BUN 14.0 09/29/2017 0830   CREATININE 0.99 03/08/2019 0920   CREATININE 0.9 09/29/2017 0830      Component Value Date/Time   CALCIUM 9.2 03/08/2019 0920   CALCIUM 9.6 09/29/2017 0830   ALKPHOS 52 03/08/2019 0920   ALKPHOS 60 09/29/2017 0830   AST 23 03/08/2019 0920   AST 21 09/29/2017 0830   ALT 29 03/08/2019 0920   ALT 69 (H) 09/29/2017 0830   BILITOT 0.5 03/08/2019 0920   BILITOT 0.46 09/29/2017 0830      All questions were answered. The patient knows to call the clinic with any problems, questions or concerns. No barriers to learning was detected.  I spent 15 minutes counseling the patient face to face. The total time spent in the appointment was 20  minutes and more than 50% was on counseling and review of test results  Heath Lark, MD 03/15/2019 9:16 AM

## 2019-03-15 NOTE — Research (Signed)
03/15/2019 Research - [facility] ECOG-ACRIN [id number] Maintenance Cycle 55 Patient in to clinic unaccompanied today for evaluation prior to beginning treatment Cycle 55. Patient reports side effects over the past month including her usual complaints of back and bone pain, ongoing fatigue, insomnia, and peripheral sensory neuropathy, all of which are essentially unchanged in character or intensity. She has also noted occasional headaches, not unusual or worsening from her baseline. Based on lab results review and history and physical by Dr. Alvy Bimler, patient meets criteria for continued treatment with Revlimid, which she is in agreement to do in spite of the current COVID-19 pandemic. She will continue with monthly clinic visits on 04/12/19 and 05/10/19 for hematology and AE assessments prior to beginning subsequent treatment cycles. Labs, including 24-hr urine sample as requested by Dr. Alvy Bimler, to be performed on 05/31/2019 one week prior to the next MD clinic visit at the end of Cycle 57.  Patient returned completed cycle 54 Medication Calendar, confirming dosing at dose level -3, Revlimid 5mg  every other day, Days 1-21, with no missed doses. Patient given printed appointment calendar with Cycle 55 treatment days marked, to document doses for the next treatment cycle, which will begin today. Patient is aware that she should contact the office at any time for any symptoms or problems of concern. [name] BSN, RN, CCRP 03/15/2019 10:39 AM

## 2019-03-24 NOTE — Research (Addendum)
03/24/2019  Adverse Event Log Study/Protocol: [facility] ECOG E1A11 Maintenance Cycles 52-54: 12/21/2018 - 03/15/2019 (end of Cycle 54 = 1/88/4166) Solicited &/or Reportable Events Grade Attribution to lenalidomide Cycle # Comments  Anemia Grade 1 Definite 52-53    Hyperglycemia Grade 0     Lymphocyte count decreased Grade 0     Neutrophil count decreased Grade 2  Definite 52-54    Platelet count decreased Grade 0      Diarrhea Grade 1 Probable 52 Mild, maximum3 stools/24 hours  Dyspnea Grade 0     Edema: limbs Grade 0     Fatigue  Grade 2  Probable 52-54 Moderate, occasionally limiting ADLs (unchanged from previous)  Fever Grade 0     Insomnia  Grade 2  Unlikely 52-54 Moderate difficulty; regular use of sleeping pill  Irritability Grade 1 Unrelated 53 Mild symptoms  Muscle weakness trunk Grade 0     Nausea Grade 0     Peripheral motor neuropathy Grade 0     Peripheral sensory neuropathy  Grade 2 Unlikely 52-54 Moderate symptoms, unchanged from previous  Rash acneiform Grade 0     Vomiting Grade 0      Non-reportable AEs (unsolicited, < Grade 3): - Leukopenia (1) - Back pain (2) - Bone pain (2) - Headache (2) - Depression (1) - Weight loss (1)  [name] BSN, RN, [place] 03/24/2019 10:34 AM

## 2019-03-28 ENCOUNTER — Other Ambulatory Visit: Payer: Self-pay

## 2019-03-28 DIAGNOSIS — C9001 Multiple myeloma in remission: Secondary | ICD-10-CM

## 2019-03-28 MED ORDER — LENALIDOMIDE 5 MG PO CAPS CTSU E1A11: ORAL_CAPSULE | ORAL | 0 refills | Status: DC

## 2019-03-29 ENCOUNTER — Other Ambulatory Visit: Payer: Self-pay | Admitting: *Deleted

## 2019-03-29 DIAGNOSIS — C9001 Multiple myeloma in remission: Secondary | ICD-10-CM

## 2019-03-29 MED ORDER — LENALIDOMIDE 5 MG PO CAPS CTSU E1A11: ORAL_CAPSULE | ORAL | 0 refills | Status: DC

## 2019-04-01 ENCOUNTER — Other Ambulatory Visit: Payer: Self-pay | Admitting: Hematology and Oncology

## 2019-04-12 ENCOUNTER — Other Ambulatory Visit: Payer: Self-pay

## 2019-04-12 ENCOUNTER — Inpatient Hospital Stay: Payer: Medicare Other | Admitting: *Deleted

## 2019-04-12 ENCOUNTER — Inpatient Hospital Stay: Payer: Medicare Other | Attending: Hematology and Oncology

## 2019-04-12 ENCOUNTER — Encounter: Payer: Self-pay | Admitting: Hematology and Oncology

## 2019-04-12 DIAGNOSIS — C9001 Multiple myeloma in remission: Secondary | ICD-10-CM | POA: Insufficient documentation

## 2019-04-12 DIAGNOSIS — Z006 Encounter for examination for normal comparison and control in clinical research program: Secondary | ICD-10-CM | POA: Insufficient documentation

## 2019-04-12 LAB — CBC WITH DIFFERENTIAL (CANCER CENTER ONLY)
Abs Immature Granulocytes: 0.01 K/uL (ref 0.00–0.07)
Basophils Absolute: 0 K/uL (ref 0.0–0.1)
Basophils Relative: 0 %
Eosinophils Absolute: 0.1 K/uL (ref 0.0–0.5)
Eosinophils Relative: 2 %
HCT: 40 % (ref 36.0–46.0)
Hemoglobin: 12.2 g/dL (ref 12.0–15.0)
Immature Granulocytes: 0 %
Lymphocytes Relative: 49 %
Lymphs Abs: 2 K/uL (ref 0.7–4.0)
MCH: 22.9 pg — ABNORMAL LOW (ref 26.0–34.0)
MCHC: 30.5 g/dL (ref 30.0–36.0)
MCV: 75 fL — ABNORMAL LOW (ref 80.0–100.0)
Monocytes Absolute: 0.4 K/uL (ref 0.1–1.0)
Monocytes Relative: 11 %
Neutro Abs: 1.6 K/uL — ABNORMAL LOW (ref 1.7–7.7)
Neutrophils Relative %: 38 %
Platelet Count: 188 K/uL (ref 150–400)
RBC: 5.33 MIL/uL — ABNORMAL HIGH (ref 3.87–5.11)
RDW: 14.9 % (ref 11.5–15.5)
WBC Count: 4 K/uL (ref 4.0–10.5)
nRBC: 0 % (ref 0.0–0.2)

## 2019-04-12 MED ORDER — LENALIDOMIDE 5 MG PO CAPS CTSU E1A11: ORAL_CAPSULE | ORAL | 0 refills | Status: DC

## 2019-04-12 NOTE — Research (Addendum)
Research - [facility] ECOG-ACRIN [id number] Maintenance Cycle 55 Patient into clinic unaccompanied today for evaluation prior to beginning treatment Cycle 56. Patient returned completed cycle 55 Medication Calendar, confirming dosing at dose level -3, Revlimid 5mg  every other day, Days 1-21, with no missed doses. Patient given printed appointment calendar with Cycle 56 treatment days marked, to document doses for the next treatment cycle, scheduled to begin today. Patient reports side effects over the past month including her usual complaints of back and bone pain, ongoing fatigue, insomnia, and peripheral sensory neuropathy, all of which are essentially unchanged in character or intensity. Patient reports that she has been taking fewer pain pills and sleeping pills, without significant change in her symptoms, although she does feel like her insomnia is slightly worse. She reports more days with episodes of diarrhea (6 days, spread out through the cycle, including off week), ranging from 2 to a maximum of 4 stools per day. Patient is aware that she should contact the office for any symptoms or problems of concern, and that on-call staff are available 24/7 throughout the holiday. Patient voiced understanding.   Following patient's departure from clinic, CBC results were received, showing ANC of 1600 with other values within parameters for retreatment. Spoke with patient's husband by phone and requested that he notify patient that lab results are acceptable for treatment to begin today. Mr. Sulser confirmed this information.  [name] BSN, RN, [place] 04/12/2019 8:51 AM  Adverse Event Log Study/Protocol: [facility] ECOG E1A11 Maintenance Cycle 55: 03/15/2019 - 04/12/2019 (end of Cycle = 12/19/4172) Solicited &/or Reportable Events Grade Comments  Anemia Grade 0    Hyperglycemia n/a Not assessed this cycle  Lymphocyte count decreased Grade 0   Neutrophil count decreased Grade 1     Platelet count decreased Grade 0      Diarrhea Grade 1 Several days, max 4 stools/day  Dyspnea Grade 0   Edema: limbs Grade 0   Fatigue  Grade 2  Moderate, occasionally limiting ADLs (unchanged from previous)  Fever Grade 0   Insomnia  Grade 2  Moderate difficulty; stopped taking pain pills and sleeping pills regularly  Irritability Grade 0   Muscle weakness trunk Grade 0   Nausea Grade 0   Peripheral motor neuropathy Grade 0   Peripheral sensory neuropathy  Grade 2 Moderate symptoms, unchanged from previous  Rash acneiform Grade 0   Vomiting Grade 0   Non-reportable AEs (unsolicited, < Grade3): Moderate pain [grade 2]  Back pain  Bone pain  Headache  Knee pain [name] BSN, RN, [place] 04/12/2019 12:18 PM

## 2019-04-28 ENCOUNTER — Other Ambulatory Visit: Payer: Self-pay | Admitting: Hematology and Oncology

## 2019-04-28 DIAGNOSIS — C9001 Multiple myeloma in remission: Secondary | ICD-10-CM

## 2019-05-03 ENCOUNTER — Other Ambulatory Visit: Payer: Self-pay | Admitting: Hematology and Oncology

## 2019-05-03 ENCOUNTER — Telehealth: Payer: Self-pay

## 2019-05-03 DIAGNOSIS — C9001 Multiple myeloma in remission: Secondary | ICD-10-CM

## 2019-05-03 MED ORDER — LENALIDOMIDE 5 MG PO CAPS: ORAL_CAPSULE | ORAL | 0 refills | Status: DC

## 2019-05-03 NOTE — Telephone Encounter (Signed)
[employer] auth number obtained. Revlimed prescription sent to [facility]

## 2019-05-10 ENCOUNTER — Inpatient Hospital Stay: Payer: Medicare Other | Attending: Hematology and Oncology

## 2019-05-10 ENCOUNTER — Inpatient Hospital Stay: Payer: Medicare Other | Admitting: *Deleted

## 2019-05-10 ENCOUNTER — Other Ambulatory Visit: Payer: Self-pay

## 2019-05-10 DIAGNOSIS — C9001 Multiple myeloma in remission: Secondary | ICD-10-CM

## 2019-05-10 DIAGNOSIS — Z006 Encounter for examination for normal comparison and control in clinical research program: Secondary | ICD-10-CM | POA: Diagnosis present

## 2019-05-10 DIAGNOSIS — R946 Abnormal results of thyroid function studies: Secondary | ICD-10-CM | POA: Diagnosis not present

## 2019-05-10 LAB — CBC WITH DIFFERENTIAL (CANCER CENTER ONLY)
Abs Immature Granulocytes: 0.01 10*3/uL (ref 0.00–0.07)
Basophils Absolute: 0 10*3/uL (ref 0.0–0.1)
Basophils Relative: 1 %
Eosinophils Absolute: 0.1 10*3/uL (ref 0.0–0.5)
Eosinophils Relative: 2 %
HCT: 40.3 % (ref 36.0–46.0)
Hemoglobin: 12.3 g/dL (ref 12.0–15.0)
Immature Granulocytes: 0 %
Lymphocytes Relative: 55 %
Lymphs Abs: 2.3 10*3/uL (ref 0.7–4.0)
MCH: 22.8 pg — ABNORMAL LOW (ref 26.0–34.0)
MCHC: 30.5 g/dL (ref 30.0–36.0)
MCV: 74.6 fL — ABNORMAL LOW (ref 80.0–100.0)
Monocytes Absolute: 0.5 10*3/uL (ref 0.1–1.0)
Monocytes Relative: 12 %
Neutro Abs: 1.3 10*3/uL — ABNORMAL LOW (ref 1.7–7.7)
Neutrophils Relative %: 30 %
Platelet Count: 201 10*3/uL (ref 150–400)
RBC: 5.4 MIL/uL — ABNORMAL HIGH (ref 3.87–5.11)
RDW: 14.9 % (ref 11.5–15.5)
WBC Count: 4.2 10*3/uL (ref 4.0–10.5)
nRBC: 0 % (ref 0.0–0.2)

## 2019-05-10 MED ORDER — LENALIDOMIDE 5 MG PO CAPS CTSU E1A11: ORAL_CAPSULE | ORAL | 0 refills | Status: DC

## 2019-05-10 NOTE — Research (Signed)
05/10/2019   Research - [facility] ECOG-ACRIN [id number] Maintenance Cycle 57  Patient into clinic unaccompanied today for evaluation prior to beginning treatment Cycle 57. Patient returned completed cycle 56 Medication Calendar, confirming dosing at dose level -3, Revlimid 5mg  every other day, Days 1-21, with no missed doses. Patient given printed appointment calendar with Cycle 57 treatment days marked, to document doses for the next treatment cycle, which will begin today.  Patient reports side effects over the past month including her usual complaints of back and bone pain, ongoing fatigue, insomnia, and peripheral sensory neuropathy, all of which are essentially unchanged in character or intensity. Patient reports episode of mild calf pain x 2 days last week, similar to muscle cramping. It has since resolved. She reports taking fewer pain pills and sleeping pills, noting that she does sleep better the times she takes a sleeping pill. Patient is aware that she should contact the office for any symptoms or problems of concern, and that on-call staff are available 24/7. Patient voiced understanding. Patient denies any testing for COVID-19 infection.   Following patient's departure, lab results were reviewed noting values within parameters for retreatment. Patient was contacted by phone and will begin treatment cycle 57 today. Patient will not need to see research nurse at the next lab visit, but will return her medication diary at her Cycle 58 office visit on 06/07/2019. In addition, a 24-hr urine jug was provided to the patient at her lab appointment today. She will begin collection on Monday AM 05/30/2019 and return the sample at her lab appointment on 05/31/2019. Patient states that she has marked this on her calendar.  [name] BSN, RN, CCRP 05/10/2019 8:47 AM   Adverse Event Log Study/Protocol: [facility] ECOG E1A11 Maintenance Cycle 57: 04/12/2019 - 05/10/2019 (end of Cycle = 12/07/4942) Solicited &/or Reportable  Events Grade Comments  Anemia Grade 0    Hyperglycemia n/a Not assessed this cycle  Lymphocyte count decreased Grade 0   Neutrophil count decreased Grade 2     Platelet count decreased Grade 0     Diarrhea Grade 1 Several days, max 4 stools/day  Dyspnea Grade 0   Edema: limbs Grade 0   Fatigue  Grade 2  Moderate, occasionally limiting ADLs (unchanged from previous)  Fever Grade 0   Insomnia  Grade 2  Moderate difficulty; taking fewer pills, sleeps better when pill taken though  Irritability Grade 0   Muscle weakness trunk Grade 0   Nausea Grade 0   Peripheral motor neuropathy Grade 0   Peripheral sensory neuropathy  Grade 2 Moderate symptoms, unchanged from previous  Rash acneiform Grade 0   Vomiting Grade 0   Non-reportable AEs (unsolicited, < Grade3): Moderate pain [grade 2]  Back pain  Bone pain  Knee pain Calf pain, similar to muscle cramping (Grade 1) [name] BSN, RN, [place] 05/10/2019 10:17 AM

## 2019-05-26 ENCOUNTER — Other Ambulatory Visit: Payer: Self-pay | Admitting: Hematology and Oncology

## 2019-05-26 DIAGNOSIS — C9001 Multiple myeloma in remission: Secondary | ICD-10-CM

## 2019-05-26 NOTE — Telephone Encounter (Signed)
-

## 2019-05-31 ENCOUNTER — Other Ambulatory Visit: Payer: Self-pay

## 2019-05-31 ENCOUNTER — Inpatient Hospital Stay: Payer: Medicare Other

## 2019-05-31 DIAGNOSIS — C9001 Multiple myeloma in remission: Secondary | ICD-10-CM | POA: Diagnosis not present

## 2019-05-31 LAB — CMP (CANCER CENTER ONLY)
ALT: 30 U/L (ref 0–44)
AST: 22 U/L (ref 15–41)
Albumin: 3.7 g/dL (ref 3.5–5.0)
Alkaline Phosphatase: 54 U/L (ref 38–126)
Anion gap: 9 (ref 5–15)
BUN: 12 mg/dL (ref 8–23)
CO2: 24 mmol/L (ref 22–32)
Calcium: 8.5 mg/dL — ABNORMAL LOW (ref 8.9–10.3)
Chloride: 107 mmol/L (ref 98–111)
Creatinine: 0.9 mg/dL (ref 0.44–1.00)
GFR, Est AFR Am: >60 mL/min (ref >60)
GFR, Estimated: >60 mL/min (ref >60)
Glucose, Bld: 88 mg/dL (ref 70–99)
Potassium: 3.6 mmol/L (ref 3.5–5.1)
Sodium: 140 mmol/L (ref 135–145)
Total Bilirubin: 0.6 mg/dL (ref 0.3–1.2)
Total Protein: 7.6 g/dL (ref 6.5–8.1)

## 2019-05-31 LAB — CBC WITH DIFFERENTIAL (CANCER CENTER ONLY)
Abs Immature Granulocytes: 0.01 10*3/uL (ref 0.00–0.07)
Basophils Absolute: 0 10*3/uL (ref 0.0–0.1)
Basophils Relative: 0 %
Eosinophils Absolute: 0.1 10*3/uL (ref 0.0–0.5)
Eosinophils Relative: 2 %
HCT: 38.2 % (ref 36.0–46.0)
Hemoglobin: 11.7 g/dL — ABNORMAL LOW (ref 12.0–15.0)
Immature Granulocytes: 0 %
Lymphocytes Relative: 47 %
Lymphs Abs: 1.5 10*3/uL (ref 0.7–4.0)
MCH: 22.6 pg — ABNORMAL LOW (ref 26.0–34.0)
MCHC: 30.6 g/dL (ref 30.0–36.0)
MCV: 73.7 fL — ABNORMAL LOW (ref 80.0–100.0)
Monocytes Absolute: 0.4 10*3/uL (ref 0.1–1.0)
Monocytes Relative: 12 %
Neutro Abs: 1.3 10*3/uL — ABNORMAL LOW (ref 1.7–7.7)
Neutrophils Relative %: 39 %
Platelet Count: 248 10*3/uL (ref 150–400)
RBC: 5.18 MIL/uL — ABNORMAL HIGH (ref 3.87–5.11)
RDW: 14.8 % (ref 11.5–15.5)
WBC Count: 3.3 10*3/uL — ABNORMAL LOW (ref 4.0–10.5)
nRBC: 0 % (ref 0.0–0.2)

## 2019-05-31 LAB — LACTATE DEHYDROGENASE: LDH: 175 U/L (ref 98–192)

## 2019-05-31 LAB — TSH: TSH: 1.332 u[IU]/mL (ref 0.308–3.960)

## 2019-06-01 LAB — KAPPA/LAMBDA LIGHT CHAINS
Kappa free light chain: 23.9 mg/L — ABNORMAL HIGH (ref 3.3–19.4)
Kappa, lambda light chain ratio: 1.34 (ref 0.26–1.65)
Lambda free light chains: 17.8 mg/L (ref 5.7–26.3)

## 2019-06-01 LAB — UPEP/UIFE/LIGHT CHAINS/TP, 24-HR UR
% BETA, Urine: 0 %
ALPHA 1 URINE: 0 %
Albumin, U: 100 %
Alpha 2, Urine: 0 %
Free Kappa Lt Chains,Ur: 12.97 mg/L (ref 0.63–113.79)
Free Kappa/Lambda Ratio: 10.72 (ref 1.03–31.76)
Free Lambda Lt Chains,Ur: 1.21 mg/L (ref 0.47–11.77)
GAMMA GLOBULIN URINE: 0 %
Total Protein, Urine-Ur/day: 130 mg/24 hr (ref 30–150)
Total Protein, Urine: 5.1 mg/dL
Total Volume: 2550

## 2019-06-01 LAB — MULTIPLE MYELOMA PANEL, SERUM
Albumin SerPl Elph-Mcnc: 3.8 g/dL (ref 2.9–4.4)
Albumin/Glob SerPl: 1.1 (ref 0.7–1.7)
Alpha 1: 0.2 g/dL (ref 0.0–0.4)
Alpha2 Glob SerPl Elph-Mcnc: 0.5 g/dL (ref 0.4–1.0)
B-Globulin SerPl Elph-Mcnc: 1.1 g/dL (ref 0.7–1.3)
Gamma Glob SerPl Elph-Mcnc: 1.6 g/dL (ref 0.4–1.8)
Globulin, Total: 3.5 g/dL (ref 2.2–3.9)
IgA: 126 mg/dL (ref 87–352)
IgG (Immunoglobin G), Serum: 1870 mg/dL — ABNORMAL HIGH (ref 586–1602)
IgM (Immunoglobulin M), Srm: 67 mg/dL (ref 26–217)
Total Protein ELP: 7.3 g/dL (ref 6.0–8.5)

## 2019-06-02 ENCOUNTER — Other Ambulatory Visit: Payer: Self-pay | Admitting: *Deleted

## 2019-06-07 ENCOUNTER — Encounter: Payer: Self-pay | Admitting: Hematology and Oncology

## 2019-06-07 ENCOUNTER — Other Ambulatory Visit: Payer: Self-pay

## 2019-06-07 ENCOUNTER — Inpatient Hospital Stay: Payer: Medicare Other | Attending: Hematology and Oncology | Admitting: Hematology and Oncology

## 2019-06-07 ENCOUNTER — Inpatient Hospital Stay: Payer: Medicare Other | Admitting: *Deleted

## 2019-06-07 DIAGNOSIS — M8788 Other osteonecrosis, other site: Secondary | ICD-10-CM | POA: Insufficient documentation

## 2019-06-07 DIAGNOSIS — Z79891 Long term (current) use of opiate analgesic: Secondary | ICD-10-CM | POA: Insufficient documentation

## 2019-06-07 DIAGNOSIS — C9001 Multiple myeloma in remission: Secondary | ICD-10-CM

## 2019-06-07 DIAGNOSIS — G893 Neoplasm related pain (acute) (chronic): Secondary | ICD-10-CM | POA: Insufficient documentation

## 2019-06-07 DIAGNOSIS — Z006 Encounter for examination for normal comparison and control in clinical research program: Secondary | ICD-10-CM | POA: Diagnosis not present

## 2019-06-07 DIAGNOSIS — D61811 Other drug-induced pancytopenia: Secondary | ICD-10-CM | POA: Diagnosis not present

## 2019-06-07 MED ORDER — LENALIDOMIDE 5 MG PO CAPS CTSU E1A11: ORAL_CAPSULE | ORAL | 0 refills | Status: DC

## 2019-06-07 NOTE — Assessment & Plan Note (Signed)
She have chronic bone pain which started with a diagnosis of multiple myeloma. We discussed chronic pain management. MRI did suggest she had evidence of degenerative disc disease and spinal stenosis. Previously, we have extensive disease and the patient is comfortable to remain on chronic narcotic prescription. I refilled her prescriptions We discussed narcotic refill policy I do not believe her recent skin itching is related to side effects of oxycodone and she agreed to continue

## 2019-06-07 NOTE — Assessment & Plan Note (Signed)
This is likely due to recent treatment. The patient denies recent history of fevers, cough, chills, diarrhea or dysuria. She is asymptomatic from the leukopenia. I will observe for now.  I will continue the chemotherapy at current dose without dosage adjustment.  If the leukopenia gets progressive worse in the future, I might have to delay her treatment or adjust the chemotherapy dose per protocol

## 2019-06-07 NOTE — Assessment & Plan Note (Signed)
I reviewed test results with her  Due to the virus pandemic, we discussed the risk and benefits of continuing treatment. The patient is comfortable to remain on Revlimid indefinitely. She will continue treatment per research protocol She will continue calcium with vitamin D supplement along with aspirin for DVT prophylaxis. 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 I will see her back as scheduled Her recent blood work and 24-hour urine collection suggested complete remission

## 2019-06-07 NOTE — Progress Notes (Signed)
[place] OFFICE PROGRESS NOTE  Patient Care Team: Wenda Low, MD as PCP - General (Internal Medicine) Heath Lark, MD as Consulting Physician (Hematology and Oncology) Benson Norway, RN as Registered Nurse (Oncology)  ASSESSMENT & PLAN:  Multiple myeloma in remission [facility]) I reviewed test results with her  Due to the virus pandemic, we discussed the risk and benefits of continuing treatment. The patient is comfortable to remain on Revlimid indefinitely. She will continue treatment per research protocol She will continue calcium with vitamin D supplement along with aspirin for DVT prophylaxis. 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 We discussed chronic pain management. MRI did suggest she had evidence of degenerative disc disease and spinal stenosis. Previously, we have extensive disease and the patient is comfortable to remain on chronic narcotic prescription. I refilled her prescriptions We discussed narcotic refill policy I do not believe her recent skin itching is related to side effects of oxycodone and she agreed to continue  Drug-induced pancytopenia [facility]) This is likely due to recent treatment. The patient denies recent history of fevers, cough, chills, diarrhea or dysuria. She is asymptomatic from the leukopenia. I will observe for now.  I will continue the chemotherapy at current dose without dosage adjustment.  If the leukopenia gets progressive worse in the future, I might have to delay her treatment or adjust the chemotherapy dose per protocol    No orders of the defined types were placed in this encounter.   INTERVAL HISTORY: Please see below for problem oriented charting. She returns for further  follow-up She had one episode of skin itching at night after she took her pain medicine but that has resolved since then She is not convinced that was related to an allergic reaction She has no other new symptoms No new bone pain No recent dental issues No recent infection, fever or chills  SUMMARY OF ONCOLOGIC HISTORY: Oncology History Overview Note  ISS stage 1 IgG lambda subtype (serum albumin 3.6, Beta2 microglobulin 2.32) Durie Salmon Stage 1   Multiple myeloma in remission ([place])  10/10/2013 Imaging   Skeletal survery was negative   11/09/2013 Bone Marrow Biopsy   BM biopsy confirmed myeloma, 76% involved, IgG lambda subtype   12/06/2013 - 08/29/2014 Chemotherapy   Sh received chemo with revlimid, Velcade, Dexamethasone and Zometa. Patient particpated in clinical research [facility] [phone]   02/23/2014 Bone Marrow Biopsy   Repeat bone marrow biopsy showed 5% involvement   03/31/2014 Adverse Reaction   Zometa was discontinued due to osteonecrosis of the jaw.   05/05/2014 Imaging   Imaging study of the neck showed no explanation that could cause right neck pain. She is noted to have incidental left upper lung nodule. Plan to repeat imaging study in 3 months.   09/06/2014 Imaging   Bone survey showed no evidence of fracture   09/14/2014 Bone Marrow Biopsy   Bone marrow biopsy showed 8% residual plasma cells by manual count but none on the biopsy specimen   09/26/2014 -  Chemotherapy   She is started on cycle 1 of maintenance Revlimid   09/26/2014 -  Chemotherapy   The patient had [No matching medication found in this treatment plan]  for chemotherapy treatment.    01/31/2015 Imaging  chest x-ray showed pneumonia. Treatment was placed on hold.   05/03/2015 Bone Marrow Biopsy   Accession: [id number] repeat bone marrow aspirate and biopsy show 5% residual plasma cells   10/14/2016 Bone Marrow Biopsy   Bone marrow biopsy showed the plasma cells represent 4% of all cells with lack of  large aggregates or sheets. To assess the plasma cell clonality, immunohistochemical stains is performed and it lack clonality. Normal cytogenetics and FISH   01/09/2017 Imaging   CT chest showed ground-glass 1.5 cm apical left upper lobe pulmonary nodule. Initial follow-up with CT at 6-12 months is recommended to confirm persistence. If persistent, repeat CT is recommended every 2 years until 5 years of stability has been established. This recommendation follows the consensus statement: Guidelines for Management of Incidental Pulmonary Nodules Detected on CT Images: From the Fleischner Society 2017; Radiology 2017; 284:228-243. 2. Mild patchy ground-glass opacities in the right upper lobe, probably inflammatory, which can also be reassessed on follow-up chest CT performed for the above dominant ground-glass nodule. 3. Solitary 3 mm right lower lobe solid pulmonary nodule, which can also be reassessed on follow-up chest CT performed for the above dominant ground-glass nodule. 4. No thoracic adenopathy. 5. Aortic atherosclerosis. Two-vessel coronary atherosclerosis.   06/30/2017 Imaging   1. No interval change in the 11 x 13 mm ground-glass nodule left upper lobe. Given the nearly 6 months of imaging stability, repeat CT is recommended every 2 years until 5 years of stability has been established. This recommendation follows the consensus statement: Guidelines for Management of Incidental Pulmonary Nodules Detected on CT Images: From the Fleischner Society 2017; Radiology 2017; 284:228-243. 2. Interval resolution of the tiny patchy ground-glass nodules right upper lobe, likely infectious/inflammatory etiology. 3. Stable 3 mm right lower lobe pulmonary nodule. Attention on follow-up recommended. 4. Aortic Atherosclerois (ICD10-170.0)   09/29/2017 Imaging   Skeletal survey 1. Questionable new tiny lucency noted the posterior portion of C3 vertebral body. This may represent a small lytic lesion.  2. No  definite thoracic lesion noted on today's exam. Stable lucencies in the left ilium and acetabulum.  3. No other focal abnormalities identified. The left hip is unremarkable .     REVIEW OF SYSTEMS:   Constitutional: Denies fevers, chills or abnormal weight loss Eyes: Denies blurriness of vision Ears, nose, mouth, throat, and face: Denies mucositis or sore throat Respiratory: Denies cough, dyspnea or wheezes Cardiovascular: Denies palpitation, chest discomfort or lower extremity swelling Gastrointestinal:  Denies nausea, heartburn or change in bowel habits Skin: Denies abnormal skin rashes Lymphatics: Denies new lymphadenopathy or easy bruising Neurological:Denies numbness, tingling or new weaknesses Behavioral/Psych: Mood is stable, no new changes  All other systems were reviewed with the patient and are negative.  I have reviewed the past medical history, past surgical history, social history and family history with the patient and they are unchanged from previous note.  ALLERGIES:  has No Known Allergies.  MEDICATIONS:  Current Outpatient Medications  Medication Sig Dispense Refill  . aspirin 325 MG tablet Take 325 mg by mouth daily.     . Cholecalciferol (VITAMIN D3) 2000 UNITS TABS Take 2,000 Units by mouth daily.    . hydrochlorothiazide (HYDRODIURIL) 25 MG tablet Take 25 mg by mouth every evening.     Marland Kitchen lenalidomide (REVLIMID) 5 MG capsule Take 1 capsule every other day for 21 days, off 7 days. Repeat every 28 days. 11 capsule 0  . levothyroxine (SYNTHROID, LEVOTHROID) 75 MCG tablet Take 75 mcg by mouth daily  before breakfast.    . methocarbamol (ROBAXIN) 500 MG tablet Take 1 tablet (500 mg total) by mouth every 8 (eight) hours as needed for muscle spasms. 90 tablet 1  . Multiple Vitamins-Minerals (CENTRUM SILVER PO) Take 1 tablet by mouth daily.     Marland Kitchen oxyCODONE (ROXICODONE) 15 MG immediate release tablet Take 1 tablet (15 mg total) by mouth 3 (three) times daily as needed  for pain. 90 tablet 0  . polyethylene glycol (MIRALAX / GLYCOLAX) packet Take 17 g by mouth daily as needed for mild constipation.     . RESTASIS 0.05 % ophthalmic emulsion PLACE 1 DROP IN BOTH EYES IN THE EVENING  4  . sitaGLIPtin (JANUVIA) 50 MG tablet Take 50 mg by mouth daily.    . traZODone (DESYREL) 50 MG tablet TAKE 1 TABLET (50 MG TOTAL) BY MOUTH AT BEDTIME. 90 tablet 3  . venlafaxine XR (EFFEXOR-XR) 150 MG 24 hr capsule Take 1 capsule (150 mg total) by mouth daily with breakfast. 90 capsule 1  . verapamil (VERELAN PM) 120 MG 24 hr capsule TAKE 1 CAPSULE BY MOUTH EVERY DAY 90 capsule 2   No current facility-administered medications for this visit.    Facility-Administered Medications Ordered in Other Visits  Medication Dose Route Frequency Provider Last Rate Last Dose  . gadopentetate dimeglumine (MAGNEVIST) injection 15 mL  15 mL Intravenous Once PRN Melvenia Beam, MD        PHYSICAL EXAMINATION: ECOG PERFORMANCE STATUS: 1 - Symptomatic but completely ambulatory  Vitals:   06/07/19 0820  BP: (!) 147/83  Pulse: 66  Resp: 18  Temp: 98.2 F (36.8 C)  SpO2: 100%   Filed Weights   06/07/19 0820  Weight: 166 lb 6.4 oz (75.5 kg)    GENERAL:alert, no distress and comfortable SKIN: skin color, texture, turgor are normal, no rashes or significant lesions EYES: normal, Conjunctiva are pink and non-injected, sclera clear OROPHARYNX:no exudate, no erythema and lips, buccal mucosa, and tongue normal  NECK: supple, thyroid normal size, non-tender, without nodularity LYMPH:  no palpable lymphadenopathy in the cervical, axillary or inguinal LUNGS: clear to auscultation and percussion with normal breathing effort HEART: regular rate & rhythm and no murmurs and no lower extremity edema ABDOMEN:abdomen soft, non-tender and normal bowel sounds Musculoskeletal:no cyanosis of digits and no clubbing  NEURO: alert & oriented x 3 with fluent speech, no focal motor/sensory  deficits  LABORATORY DATA:  I have reviewed the data as listed    Component Value Date/Time   NA 140 05/31/2019 0804   NA 141 09/29/2017 0830   K 3.6 05/31/2019 0804   K 3.6 09/29/2017 0830   CL 107 05/31/2019 0804   CO2 24 05/31/2019 0804   CO2 29 09/29/2017 0830   GLUCOSE 88 05/31/2019 0804   GLUCOSE 87 09/29/2017 0830   BUN 12 05/31/2019 0804   BUN 14.0 09/29/2017 0830   CREATININE 0.90 05/31/2019 0804   CREATININE 0.9 09/29/2017 0830   CALCIUM 8.5 (L) 05/31/2019 0804   CALCIUM 9.6 09/29/2017 0830   PROT 7.6 05/31/2019 0804   PROT 7.3 09/29/2017 0832   PROT 8.0 09/29/2017 0830   ALBUMIN 3.7 05/31/2019 0804   ALBUMIN 3.8 09/29/2017 0830   AST 22 05/31/2019 0804   AST 21 09/29/2017 0830   ALT 30 05/31/2019 0804   ALT 69 (H) 09/29/2017 0830   ALKPHOS 54 05/31/2019 0804   ALKPHOS 60 09/29/2017 0830   BILITOT 0.6 05/31/2019 0804   BILITOT 0.46 09/29/2017 0830  GFRNONAA >60 05/31/2019 0804   GFRAA >60 05/31/2019 0804    No results found for: SPEP, UPEP  Lab Results  Component Value Date   WBC 3.3 (L) 05/31/2019   NEUTROABS 1.3 (L) 05/31/2019   HGB 11.7 (L) 05/31/2019   HCT 38.2 05/31/2019   MCV 73.7 (L) 05/31/2019   PLT 248 05/31/2019      Chemistry      Component Value Date/Time   NA 140 05/31/2019 0804   NA 141 09/29/2017 0830   K 3.6 05/31/2019 0804   K 3.6 09/29/2017 0830   CL 107 05/31/2019 0804   CO2 24 05/31/2019 0804   CO2 29 09/29/2017 0830   BUN 12 05/31/2019 0804   BUN 14.0 09/29/2017 0830   CREATININE 0.90 05/31/2019 0804   CREATININE 0.9 09/29/2017 0830      Component Value Date/Time   CALCIUM 8.5 (L) 05/31/2019 0804   CALCIUM 9.6 09/29/2017 0830   ALKPHOS 54 05/31/2019 0804   ALKPHOS 60 09/29/2017 0830   AST 22 05/31/2019 0804   AST 21 09/29/2017 0830   ALT 30 05/31/2019 0804   ALT 69 (H) 09/29/2017 0830   BILITOT 0.6 05/31/2019 0804   BILITOT 0.46 09/29/2017 0830      All questions were answered. The patient knows to call the  clinic with any problems, questions or concerns. No barriers to learning was detected.  I spent 15 minutes counseling the patient face to face. The total time spent in the appointment was 20 minutes and more than 50% was on counseling and review of test results  Heath Lark, MD 06/07/2019 12:40 PM

## 2019-06-07 NOTE — Research (Signed)
06/07/2019 Research - [facility] ECOG-ACRIN [id number] Maintenance Cycle 58  Patient in to clinic unaccompanied today for evaluation prior to beginning treatment Cycle 58.   Labs w/myeloma panel - Obtained on 05/31/2019 within 7-day window per protocol, to allow adequate time for results prior to treatment. Results reviewed by Dr. Alvy Bimler and found to be within parameters for continued dosing at Maintenance phase Dose Leve -3, Revlimid 5 mg every other day for 21 days every 28 days. Review of myeloma panel including 24-Hr Urine for UPEP/UIFE/Light Chains/TP revealed evidence of Complete Response. Per MD, bone marrow biopsy and aspirate would not be considered standard of care.   See MD note for history and physical exam. Patient reports side effects over the past month including her usual complaints of back and bone pain, ongoing fatigue, insomnia, and peripheral sensory neuropathy, all of which are essentially unchanged in character or intensity. She notes that she is rarely taking pain medication or sleeping pills, though her symptoms of pain and insomnia have not changed significantly. She continues to ride the exercise bike for 30 minutes three times a week. She reports one episode of generalized itching that occurred after taking a dose of pain medication; after discussion with MD, this was not considered to require documentation as a side effect of oxycodone. Patient had no other complaints to report.  Based on lab results review and history and physical by Dr. Alvy Bimler, patient meets criteria for continued treatment with Revlimid, which she is in agreement to do. Patient is aware that treatment will continue indefinitely, unless there is evidence of disease recurrence, adverse effects requiring treatment discontinuation, or patient/physician decision to discontinue therapy.  Patient returned completed cycle 57 Medication Calendar, confirming dosing at dose level -3, Revlimid 97m every other day, Days 1-21, with  no missed doses. Patient given printed appointment calendar with Cycle 58 treatment days marked, to document doses for the next treatment cycle, which will begin today. Patient is aware that she should contact the office at any time for any symptoms or problems of concern.  Patient will continue with monthly clinic visits on 07/05/2019 and 08/02/2019 for hematology and AE assessments prior to beginning subsequent treatment cycles. Labs and skeletal survey to be performed on 08/23/2019 one week prior to the next MD clinic visit at the end of Cycle 60.   Cindy S. SBrigitte PulseBSN, RN, [place]7/06/2019 11:03 AM   Adverse Event Log Study/Protocol: [facility] ECOG E1A11 Maintenance Cycles 55-57: 03/15/2019 - 06/07/2019 (end of Cycle 57 = 75/08/7415 Solicited &/or Reportable Events Grade Attribution to lenalidomide Cycle # Comments  Anemia Grade 1 Definite 57    Hyperglycemia Grade 0 -    Lymphocyte count decreased Grade 0 -    Neutrophil count decreased Grade 1-2  Definite 55-57 Cycle 55 = Grade 1 Cycles 56-57 = Grade 2  Platelet count decreased Grade 0 -     Diarrhea Grade 1 Probable 55-56   Dyspnea Grade 0 -    Edema: limbs Grade 0 -    Fatigue  Grade 2  Probable 55-57 Moderate, occasionally limiting ADLs (unchanged from previous)  Fever Grade 0 -    Insomnia  Grade 2  Unlikely 55-57 Moderate difficulty; regular use of sleeping pill  Irritability Grade 0 -    Muscle weakness trunk Grade 0 -    Nausea Grade 0 -    Peripheral motor neuropathy Grade 0     Peripheral sensory neuropathy  Grade 2 Unlikely 55-57 Moderate symptoms, unchanged from previous  Rash acneiform Grade 0 -    Vomiting Grade 0 -    Non-reportable AEs (unsolicited, < Grade 3): - Leukopenia (1) - Back pain (2) - Bone pain (2) - Headache (2) - Knee pain (2) - Calf pain (1) - Hypocalcemia (1) - Weight loss (1) - Itching (2) - Hypertension (2)  [name] BSN, RN, [place] 06/16/2019 4:29 PM

## 2019-06-08 ENCOUNTER — Telehealth: Payer: Self-pay | Admitting: Hematology and Oncology

## 2019-06-08 NOTE — Telephone Encounter (Signed)
I talk with patient regarding schedule  

## 2019-06-23 ENCOUNTER — Other Ambulatory Visit: Payer: Self-pay | Admitting: Hematology and Oncology

## 2019-06-23 DIAGNOSIS — C9001 Multiple myeloma in remission: Secondary | ICD-10-CM

## 2019-06-23 NOTE — Telephone Encounter (Signed)
pls refill electronically or through research

## 2019-07-05 ENCOUNTER — Inpatient Hospital Stay: Payer: Medicare Other | Admitting: *Deleted

## 2019-07-05 ENCOUNTER — Inpatient Hospital Stay: Payer: Medicare Other | Attending: Hematology and Oncology

## 2019-07-05 ENCOUNTER — Other Ambulatory Visit: Payer: Medicare Other

## 2019-07-05 ENCOUNTER — Other Ambulatory Visit: Payer: Self-pay

## 2019-07-05 DIAGNOSIS — Z006 Encounter for examination for normal comparison and control in clinical research program: Secondary | ICD-10-CM | POA: Insufficient documentation

## 2019-07-05 DIAGNOSIS — C9001 Multiple myeloma in remission: Secondary | ICD-10-CM | POA: Insufficient documentation

## 2019-07-05 LAB — CBC WITH DIFFERENTIAL (CANCER CENTER ONLY)
Abs Immature Granulocytes: 0.01 10*3/uL (ref 0.00–0.07)
Basophils Absolute: 0 10*3/uL (ref 0.0–0.1)
Basophils Relative: 1 %
Eosinophils Absolute: 0.1 10*3/uL (ref 0.0–0.5)
Eosinophils Relative: 2 %
HCT: 37 % (ref 36.0–46.0)
Hemoglobin: 11.5 g/dL — ABNORMAL LOW (ref 12.0–15.0)
Immature Granulocytes: 0 %
Lymphocytes Relative: 51 %
Lymphs Abs: 1.8 10*3/uL (ref 0.7–4.0)
MCH: 23.2 pg — ABNORMAL LOW (ref 26.0–34.0)
MCHC: 31.1 g/dL (ref 30.0–36.0)
MCV: 74.6 fL — ABNORMAL LOW (ref 80.0–100.0)
Monocytes Absolute: 0.5 10*3/uL (ref 0.1–1.0)
Monocytes Relative: 13 %
Neutro Abs: 1.2 10*3/uL — ABNORMAL LOW (ref 1.7–7.7)
Neutrophils Relative %: 33 %
Platelet Count: 204 10*3/uL (ref 150–400)
RBC: 4.96 MIL/uL (ref 3.87–5.11)
RDW: 14.8 % (ref 11.5–15.5)
WBC Count: 3.6 10*3/uL — ABNORMAL LOW (ref 4.0–10.5)
nRBC: 0 % (ref 0.0–0.2)

## 2019-07-05 MED ORDER — LENALIDOMIDE 5 MG PO CAPS CTSU E1A11: ORAL_CAPSULE | ORAL | 0 refills | Status: DC

## 2019-07-05 NOTE — Research (Signed)
07/05/2019 Research - [facility] ECOG-ACRIN [id number] Maintenance Cycle 59  Patient into clinic unaccompanied today for evaluation prior to beginning treatment Cycle 59.   Maintenance therapy with Revlimid: Patient returned completed cycle 58 Medication Calendar, confirming dosing at dose level -3, Revlimid 5mg  every other day, Days 1-21, with no missed doses. Patient given printed appointment calendar with Cycle 59 treatment days marked, to document doses for the next treatment cycle, scheduled to begin today.  Adverse Events: Patient reports side effects over the past month including her usual complaints of ongoing fatigue, insomnia, and peripheral sensory neuropathy, all of which are essentially unchanged in character or intensity. She reports taking only one sleeping pill over the past month, although she generally continues to have moderate difficulty sleeping on a regular basis. She reports ongoing daily backache, and occasional hip, leg and knee pain, all moderate in degree. Patient notes that she does continue to ride the exercise bike 3x/week and is cooking and cleaning. Patient reports she took no pain pills over the past month. She reports only one day of diarrhea with 3 episodes occurring that day. Patient had no other symptoms to reportt   Following patient's departure from clinic, CBC results were received, showing ANC of 1200 with other values within parameters for retreatment. Spoke with patient's husband by phone and requested that he notify patient that lab results are acceptable for treatment to begin today. Mr. Alfonzo confirmed this information.  Adverse Event Log Study/Protocol: [facility] ECOG E1A11 Maintenance Cycle 58: 06/07/2019 - 07/05/2019 (end of Cycle = 01/07/5169) Solicited &/or Reportable Events Grade Comments  Anemia Grade 1    Hyperglycemia n/a Not assessed this cycle  Lymphocyte count decreased Grade 0   Neutrophil count decreased Grade 2     Platelet count decreased Grade 0     Diarrhea  Grade 1 One day with 3 stools/24 hours  Dyspnea Grade 0   Edema: limbs Grade 0   Fatigue  Grade 2  Moderate, occasionally limiting ADLs (unchanged from previous)  Fever Grade 0   Insomnia  Grade 2  Moderate difficulty;only took one sleeping pill; sleeps better when pill taken  Irritability Grade 0   Muscle weakness trunk Grade 0   Nausea Grade 0   Peripheral motor neuropathy Grade 0   Peripheral sensory neuropathy  Grade 2 Moderate symptoms, unchanged from previous  Rash acneiform Grade 0   Vomiting Grade 0   Non-reportable AEs (unsolicited, < Grade3): Moderate pain [grade 2]  Back pain  Leg pain (1 day)  Hip pain  Knee pain [name] BSN, RN, [place] 07/05/2019 10:08 AM

## 2019-07-21 ENCOUNTER — Other Ambulatory Visit: Payer: Self-pay | Admitting: Hematology and Oncology

## 2019-07-21 DIAGNOSIS — C9001 Multiple myeloma in remission: Secondary | ICD-10-CM

## 2019-07-21 NOTE — Telephone Encounter (Signed)
-

## 2019-08-02 ENCOUNTER — Inpatient Hospital Stay: Payer: Medicare Other | Admitting: *Deleted

## 2019-08-02 ENCOUNTER — Inpatient Hospital Stay: Payer: Medicare Other | Attending: Hematology and Oncology

## 2019-08-02 ENCOUNTER — Other Ambulatory Visit: Payer: Self-pay | Admitting: Hematology and Oncology

## 2019-08-02 ENCOUNTER — Other Ambulatory Visit: Payer: Self-pay

## 2019-08-02 ENCOUNTER — Other Ambulatory Visit: Payer: Medicare Other

## 2019-08-02 ENCOUNTER — Encounter: Payer: Self-pay | Admitting: Hematology and Oncology

## 2019-08-02 ENCOUNTER — Other Ambulatory Visit: Payer: Self-pay | Admitting: *Deleted

## 2019-08-02 DIAGNOSIS — K529 Noninfective gastroenteritis and colitis, unspecified: Secondary | ICD-10-CM | POA: Insufficient documentation

## 2019-08-02 DIAGNOSIS — Z79899 Other long term (current) drug therapy: Secondary | ICD-10-CM | POA: Diagnosis not present

## 2019-08-02 DIAGNOSIS — Z006 Encounter for examination for normal comparison and control in clinical research program: Secondary | ICD-10-CM | POA: Insufficient documentation

## 2019-08-02 DIAGNOSIS — C9001 Multiple myeloma in remission: Secondary | ICD-10-CM

## 2019-08-02 DIAGNOSIS — D61810 Antineoplastic chemotherapy induced pancytopenia: Secondary | ICD-10-CM | POA: Insufficient documentation

## 2019-08-02 DIAGNOSIS — G893 Neoplasm related pain (acute) (chronic): Secondary | ICD-10-CM | POA: Diagnosis not present

## 2019-08-02 DIAGNOSIS — E119 Type 2 diabetes mellitus without complications: Secondary | ICD-10-CM | POA: Insufficient documentation

## 2019-08-02 LAB — CBC WITH DIFFERENTIAL (CANCER CENTER ONLY)
Abs Immature Granulocytes: 0.01 10*3/uL (ref 0.00–0.07)
Basophils Absolute: 0 10*3/uL (ref 0.0–0.1)
Basophils Relative: 0 %
Eosinophils Absolute: 0.1 10*3/uL (ref 0.0–0.5)
Eosinophils Relative: 2 %
HCT: 38.2 % (ref 36.0–46.0)
Hemoglobin: 11.9 g/dL — ABNORMAL LOW (ref 12.0–15.0)
Immature Granulocytes: 0 %
Lymphocytes Relative: 53 %
Lymphs Abs: 2.4 10*3/uL (ref 0.7–4.0)
MCH: 23.1 pg — ABNORMAL LOW (ref 26.0–34.0)
MCHC: 31.2 g/dL (ref 30.0–36.0)
MCV: 74 fL — ABNORMAL LOW (ref 80.0–100.0)
Monocytes Absolute: 0.5 10*3/uL (ref 0.1–1.0)
Monocytes Relative: 11 %
Neutro Abs: 1.5 10*3/uL — ABNORMAL LOW (ref 1.7–7.7)
Neutrophils Relative %: 34 %
Platelet Count: 241 10*3/uL (ref 150–400)
RBC: 5.16 MIL/uL — ABNORMAL HIGH (ref 3.87–5.11)
RDW: 14.5 % (ref 11.5–15.5)
WBC Count: 4.5 10*3/uL (ref 4.0–10.5)
nRBC: 0 % (ref 0.0–0.2)

## 2019-08-02 MED ORDER — LENALIDOMIDE 5 MG PO CAPS CTSU E1A11: ORAL_CAPSULE | ORAL | 0 refills | Status: DC

## 2019-08-02 NOTE — Research (Signed)
08/02/2019 Research - [facility] ECOG-ACRIN [id number] Maintenance Cycle 60   Patient into clinic unaccompanied today for evaluation prior to beginning treatment Cycle 60.    Maintenance therapy with Revlimid: Patient returned completed cycle 59 Medication Calendar, confirming dosing at dose level -3, Revlimid 5mg  every other day, Days 1-21, with no missed doses. Patient given printed appointment calendar with Cycle 60 treatment days marked, to document doses for the next treatment cycle, scheduled to begin today.   Adverse Events: Patient reports side effects over the past month including her usual complaints of ongoing fatigue and peripheral sensory neuropathy which are essentially unchanged in character or intensity. She reports taking no sleeping pills over the past month, and also notes that her sleep quality has improved somewhat. She now reports only mild difficulty sleeping (grade 1 insomnia). She reports ongoing daily backache, and occasional bone and knee pain, all moderate in degree. She also notes a few days with migraines or left-sided headaches (moderate in degree). Patient took tramadol for pain a couple of days this month and used CBD gummies (obtained from store on [employer] [address] [employer] a few days this month. She reports a few days this month with diarrhea symptoms (max 3 stools per day). Patient had no other symptoms to report. Patient notes that she does continue to exercise regularly for 33 minutes by riding the exercise bike and doing floor exercises.  Patient reports receiving the following immunizations at [facility] on 07/19/2019 - flu shot, shingles and pneumonia vaccines.   Following patient's departure from clinic, CBC results were received, showing ANC of 1500 with other values within parameters for retreatment. Spoke with patient by phone to notify her that lab results are acceptable for treatment to begin today.   [name] BSN, RN, CCRP 08/02/2019 8:51 AM   Adverse Event  Log Study/Protocol: [facility] ECOG E1A11 Maintenance Cycle 59: 07/05/2019 - 08/02/2019 (end of Cycle = [id number]) Solicited &/or Reportable Events Grade Comments  Anemia Grade 1    Hyperglycemia n/a Not assessed this cycle  Lymphocyte count decreased Grade 0    Neutrophil count decreased Grade 1     Platelet count decreased Grade 0     Diarrhea Grade 1   Dyspnea Grade 0    Edema: limbs Grade 0    Fatigue  Grade 2  Moderate, occasionally limiting ADLs (unchanged from previous)  Fever Grade 0    Insomnia  Grade 1  Mild difficulty  Irritability Grade 0    Muscle weakness trunk Grade 0    Nausea Grade 0    Peripheral motor neuropathy Grade 0    Peripheral sensory neuropathy  Grade 2 Moderate symptoms, unchanged from previous  Rash acneiform Grade 0    Vomiting Grade 0    Non-reportable AEs (other than solicited, < Grade3): Moderate pain [grade 2]  Back pain  Bone pain  Knee pain  Headache/migraine [name] BSN, RN, [place] 08/02/2019 12:17 PM

## 2019-08-05 ENCOUNTER — Telehealth: Payer: Self-pay | Admitting: Hematology and Oncology

## 2019-08-05 NOTE — Telephone Encounter (Signed)
Returned patient's phone call regarding rescheduling 09/22 lab appointment, lab appointment has been reschedule to fit with Bone Density scan per patient's request.

## 2019-08-19 ENCOUNTER — Other Ambulatory Visit: Payer: Self-pay | Admitting: Hematology and Oncology

## 2019-08-19 DIAGNOSIS — C9001 Multiple myeloma in remission: Secondary | ICD-10-CM

## 2019-08-19 NOTE — Telephone Encounter (Signed)
pls refill electronically, might have to go through research

## 2019-08-23 ENCOUNTER — Ambulatory Visit (HOSPITAL_COMMUNITY)
Admission: RE | Admit: 2019-08-23 | Discharge: 2019-08-23 | Disposition: A | Discharge status: Home / Self Care | Payer: Medicare Other | Source: Ambulatory Visit | Attending: Hematology and Oncology | Admitting: Hematology and Oncology

## 2019-08-23 ENCOUNTER — Other Ambulatory Visit: Payer: Self-pay

## 2019-08-23 ENCOUNTER — Inpatient Hospital Stay: Payer: Medicare Other

## 2019-08-23 ENCOUNTER — Other Ambulatory Visit: Payer: Medicare Other

## 2019-08-23 DIAGNOSIS — C9001 Multiple myeloma in remission: Secondary | ICD-10-CM | POA: Diagnosis not present

## 2019-08-23 LAB — CBC WITH DIFFERENTIAL (CANCER CENTER ONLY)
Abs Immature Granulocytes: 0 10*3/uL (ref 0.00–0.07)
Basophils Absolute: 0 10*3/uL (ref 0.0–0.1)
Basophils Relative: 1 %
Eosinophils Absolute: 0.1 10*3/uL (ref 0.0–0.5)
Eosinophils Relative: 3 %
HCT: 38.9 % (ref 36.0–46.0)
Hemoglobin: 12 g/dL (ref 12.0–15.0)
Immature Granulocytes: 0 %
Lymphocytes Relative: 45 %
Lymphs Abs: 1.5 10*3/uL (ref 0.7–4.0)
MCH: 23.1 pg — ABNORMAL LOW (ref 26.0–34.0)
MCHC: 30.8 g/dL (ref 30.0–36.0)
MCV: 74.8 fL — ABNORMAL LOW (ref 80.0–100.0)
Monocytes Absolute: 0.3 10*3/uL (ref 0.1–1.0)
Monocytes Relative: 10 %
Neutro Abs: 1.4 10*3/uL — ABNORMAL LOW (ref 1.7–7.7)
Neutrophils Relative %: 41 %
Platelet Count: 192 10*3/uL (ref 150–400)
RBC: 5.2 MIL/uL — ABNORMAL HIGH (ref 3.87–5.11)
RDW: 15.1 % (ref 11.5–15.5)
WBC Count: 3.3 10*3/uL — ABNORMAL LOW (ref 4.0–10.5)
nRBC: 0 % (ref 0.0–0.2)

## 2019-08-23 LAB — CMP (CANCER CENTER ONLY)
ALT: 23 U/L (ref 0–44)
AST: 19 U/L (ref 15–41)
Albumin: 3.8 g/dL (ref 3.5–5.0)
Alkaline Phosphatase: 51 U/L (ref 38–126)
Anion gap: 9 (ref 5–15)
BUN: 14 mg/dL (ref 8–23)
CO2: 25 mmol/L (ref 22–32)
Calcium: 9 mg/dL (ref 8.9–10.3)
Chloride: 105 mmol/L (ref 98–111)
Creatinine: 0.92 mg/dL (ref 0.44–1.00)
GFR, Est AFR Am: >60 mL/min (ref >60)
GFR, Estimated: >60 mL/min (ref >60)
Glucose, Bld: 129 mg/dL — ABNORMAL HIGH (ref 70–99)
Potassium: 3.5 mmol/L (ref 3.5–5.1)
Sodium: 139 mmol/L (ref 135–145)
Total Bilirubin: 0.6 mg/dL (ref 0.3–1.2)
Total Protein: 7.4 g/dL (ref 6.5–8.1)

## 2019-08-23 LAB — LACTATE DEHYDROGENASE: LDH: 160 U/L (ref 98–192)

## 2019-08-23 LAB — TSH: TSH: 1.147 u[IU]/mL (ref 0.308–3.960)

## 2019-08-24 LAB — MULTIPLE MYELOMA PANEL, SERUM
Albumin SerPl Elph-Mcnc: 3.7 g/dL (ref 2.9–4.4)
Albumin/Glob SerPl: 1.1 (ref 0.7–1.7)
Alpha 1: 0.2 g/dL (ref 0.0–0.4)
Alpha2 Glob SerPl Elph-Mcnc: 0.7 g/dL (ref 0.4–1.0)
B-Globulin SerPl Elph-Mcnc: 1 g/dL (ref 0.7–1.3)
Gamma Glob SerPl Elph-Mcnc: 1.6 g/dL (ref 0.4–1.8)
Globulin, Total: 3.4 g/dL (ref 2.2–3.9)
IgA: 127 mg/dL (ref 87–352)
IgG (Immunoglobin G), Serum: 1749 mg/dL — ABNORMAL HIGH (ref 586–1602)
IgM (Immunoglobulin M), Srm: 74 mg/dL (ref 26–217)
Total Protein ELP: 7.1 g/dL (ref 6.0–8.5)

## 2019-08-24 LAB — KAPPA/LAMBDA LIGHT CHAINS
Kappa free light chain: 23.6 mg/L — ABNORMAL HIGH (ref 3.3–19.4)
Kappa, lambda light chain ratio: 1.26 (ref 0.26–1.65)
Lambda free light chains: 18.7 mg/L (ref 5.7–26.3)

## 2019-08-25 LAB — UPEP/UIFE/LIGHT CHAINS/TP, 24-HR UR
% BETA, Urine: 0 %
ALPHA 1 URINE: 0 %
Albumin, U: 100 %
Alpha 2, Urine: 0 %
Free Kappa Lt Chains,Ur: 10.64 mg/L (ref 0.63–113.79)
Free Kappa/Lambda Ratio: 15.42 (ref 1.03–31.76)
Free Lambda Lt Chains,Ur: 0.69 mg/L (ref 0.47–11.77)
GAMMA GLOBULIN URINE: 0 %
Total Protein, Urine-Ur/day: <104 mg/24 hr (ref 30–150)
Total Protein, Urine: <4 mg/dL
Total Volume: 2600

## 2019-08-30 ENCOUNTER — Encounter: Payer: Self-pay | Admitting: Hematology and Oncology

## 2019-08-30 ENCOUNTER — Inpatient Hospital Stay: Payer: Medicare Other | Admitting: *Deleted

## 2019-08-30 ENCOUNTER — Other Ambulatory Visit: Payer: Self-pay

## 2019-08-30 ENCOUNTER — Inpatient Hospital Stay (HOSPITAL_BASED_OUTPATIENT_CLINIC_OR_DEPARTMENT_OTHER): Payer: Medicare Other | Admitting: Hematology and Oncology

## 2019-08-30 DIAGNOSIS — E119 Type 2 diabetes mellitus without complications: Secondary | ICD-10-CM

## 2019-08-30 DIAGNOSIS — D61811 Other drug-induced pancytopenia: Secondary | ICD-10-CM | POA: Diagnosis not present

## 2019-08-30 DIAGNOSIS — R197 Diarrhea, unspecified: Secondary | ICD-10-CM

## 2019-08-30 DIAGNOSIS — Z006 Encounter for examination for normal comparison and control in clinical research program: Secondary | ICD-10-CM

## 2019-08-30 DIAGNOSIS — C9001 Multiple myeloma in remission: Secondary | ICD-10-CM | POA: Diagnosis not present

## 2019-08-30 DIAGNOSIS — G893 Neoplasm related pain (acute) (chronic): Secondary | ICD-10-CM

## 2019-08-30 MED ORDER — LENALIDOMIDE 5 MG PO CAPS CTSU E1A11: ORAL_CAPSULE | ORAL | 0 refills | Status: DC

## 2019-08-30 NOTE — Progress Notes (Signed)
[place] OFFICE PROGRESS NOTE  Patient Care Team: Wenda Low, MD as PCP - General (Internal Medicine) Heath Lark, MD as Consulting Physician (Hematology and Oncology) Benson Norway, RN as Registered Nurse (Oncology)  ASSESSMENT & PLAN:  Multiple myeloma in remission [facility]) I reviewed test results with her  The patient is comfortable to remain on Revlimid indefinitely. She will continue treatment per research protocol She will continue calcium with vitamin D supplement along with aspirin for DVT prophylaxis. 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 I will see her back as scheduled Her recent blood work and 24-hour urine collection and skeletal survey suggested complete remission  Drug-induced pancytopenia ([place]) This is likely due to recent treatment. The patient denies recent history of fevers, cough, chills, diarrhea or dysuria. She is asymptomatic from the leukopenia. I will observe for now.  I will continue the chemotherapy at current dose without dosage adjustment.  If the leukopenia gets progressive worse in the future, I might have to delay her treatment or adjust the chemotherapy dose per protocol   Diarrhea She continues to have chronic diarrhea on and off Her diarrhea is made worse when she is on oral hypoglycemics Certainly, Revlimid can contribute to diarrhea but it is not clear to me that is the main reason why she has diarrhea She will continue Imodium as needed  Cancer associated pain She has minimum pain and has not taken any pain medicine lately She will continue calcium and vitamin D for bone health  Diabetes mellitus without complication ([place]) She is concerned about recent elevated blood sugar and hemoglobin A1c We discussed dietary modification and weight loss strategy She will continue close follow-up with her primary care doctor for medical management   No orders of the defined types were placed in this  encounter.   INTERVAL HISTORY: Please see below for problem oriented charting. She returns for further follow-up for multiple myeloma She tolerated treatment well No recent infection, fever or chills She has not taken any pain medicine recently She is surprised to see elevated blood sugar and hemoglobin A1c recently Her only problem is intermittent diarrhea that comes and goes It is definitely affected by her dietary changes and certain medications SUMMARY OF ONCOLOGIC HISTORY: Oncology History Overview Note  ISS stage 1 IgG lambda subtype (serum albumin 3.6, Beta2 microglobulin 2.32) Durie Salmon Stage 1   Multiple myeloma in remission ([place])  10/10/2013 Imaging   Skeletal survery was negative   11/09/2013 Bone Marrow Biopsy   BM biopsy confirmed myeloma, 76% involved, IgG lambda subtype   12/06/2013 - 08/29/2014 Chemotherapy   Sh received chemo with revlimid, Velcade, Dexamethasone and Zometa. Patient particpated in clinical research [facility] [phone]   02/23/2014 Bone Marrow Biopsy   Repeat bone marrow biopsy showed 5% involvement   03/31/2014 Adverse Reaction   Zometa was discontinued due to osteonecrosis of the jaw.   05/05/2014 Imaging   Imaging study of the neck showed no explanation that could cause right neck pain. She is noted to have incidental left upper lung nodule. Plan to repeat imaging study in 3 months.   09/06/2014 Imaging   Bone survey showed no evidence of fracture   09/14/2014 Bone Marrow Biopsy   Bone marrow biopsy showed 8% residual plasma cells by manual count but none on the biopsy specimen   09/26/2014 -  Chemotherapy   She is started on cycle 1 of maintenance Revlimid   09/26/2014 -  Chemotherapy  The patient had [No matching medication found in this treatment plan]  for chemotherapy treatment.    01/31/2015 Imaging    chest x-ray showed pneumonia. Treatment was placed on hold.   05/03/2015 Bone Marrow Biopsy   Accession: [id number] repeat bone marrow  aspirate and biopsy show 5% residual plasma cells   10/14/2016 Bone Marrow Biopsy   Bone marrow biopsy showed the plasma cells represent 4% of all cells with lack of large aggregates or sheets. To assess the plasma cell clonality, immunohistochemical stains is performed and it lack clonality. Normal cytogenetics and FISH   01/09/2017 Imaging   CT chest showed ground-glass 1.5 cm apical left upper lobe pulmonary nodule. Initial follow-up with CT at 6-12 months is recommended to confirm persistence. If persistent, repeat CT is recommended every 2 years until 5 years of stability has been established. This recommendation follows the consensus statement: Guidelines for Management of Incidental Pulmonary Nodules Detected on CT Images: From the Fleischner Society 2017; Radiology 2017; 284:228-243. 2. Mild patchy ground-glass opacities in the right upper lobe, probably inflammatory, which can also be reassessed on follow-up chest CT performed for the above dominant ground-glass nodule. 3. Solitary 3 mm right lower lobe solid pulmonary nodule, which can also be reassessed on follow-up chest CT performed for the above dominant ground-glass nodule. 4. No thoracic adenopathy. 5. Aortic atherosclerosis. Two-vessel coronary atherosclerosis.   06/30/2017 Imaging   1. No interval change in the 11 x 13 mm ground-glass nodule left upper lobe. Given the nearly 6 months of imaging stability, repeat CT is recommended every 2 years until 5 years of stability has been established. This recommendation follows the consensus statement: Guidelines for Management of Incidental Pulmonary Nodules Detected on CT Images: From the Fleischner Society 2017; Radiology 2017; 284:228-243. 2. Interval resolution of the tiny patchy ground-glass nodules right upper lobe, likely infectious/inflammatory etiology. 3. Stable 3 mm right lower lobe pulmonary nodule. Attention on follow-up recommended. 4. Aortic Atherosclerois (ICD10-170.0)    09/29/2017 Imaging   Skeletal survey 1. Questionable new tiny lucency noted the posterior portion of C3 vertebral body. This may represent a small lytic lesion.  2. No definite thoracic lesion noted on today's exam. Stable lucencies in the left ilium and acetabulum.  3. No other focal abnormalities identified. The left hip is unremarkable .     REVIEW OF SYSTEMS:   Constitutional: Denies fevers, chills or abnormal weight loss Eyes: Denies blurriness of vision Ears, nose, mouth, throat, and face: Denies mucositis or sore throat Respiratory: Denies cough, dyspnea or wheezes Cardiovascular: Denies palpitation, chest discomfort or lower extremity swelling Skin: Denies abnormal skin rashes Lymphatics: Denies new lymphadenopathy or easy bruising Neurological:Denies numbness, tingling or new weaknesses Behavioral/Psych: Mood is stable, no new changes  All other systems were reviewed with the patient and are negative.  I have reviewed the past medical history, past surgical history, social history and family history with the patient and they are unchanged from previous note.  ALLERGIES:  has No Known Allergies.  MEDICATIONS:  Current Outpatient Medications  Medication Sig Dispense Refill  . aspirin 325 MG tablet Take 325 mg by mouth daily.     Marland Kitchen CANNABIDIOL PO Take 500 mg by mouth as needed.    . Cholecalciferol (VITAMIN D3) 2000 UNITS TABS Take 2,000 Units by mouth daily.    . hydrochlorothiazide (HYDRODIURIL) 25 MG tablet Take 25 mg by mouth every evening.     Marland Kitchen lenalidomide (REVLIMID) 5 MG capsule TAKE 1 CAPSULE BY MOUTH  EVERY  OTHER DAY FOR 21 DAYS ON, THEN 7 DAYS OFF 11 capsule 0  . levothyroxine (SYNTHROID, LEVOTHROID) 75 MCG tablet Take 75 mcg by mouth daily before breakfast.    . methocarbamol (ROBAXIN) 500 MG tablet Take 1 tablet (500 mg total) by mouth every 8 (eight) hours as needed for muscle spasms. 90 tablet 1  . Multiple Vitamins-Minerals (CENTRUM SILVER PO) Take 1  tablet by mouth daily.     Marland Kitchen oxyCODONE (ROXICODONE) 15 MG immediate release tablet Take 1 tablet (15 mg total) by mouth 3 (three) times daily as needed for pain. 90 tablet 0  . polyethylene glycol (MIRALAX / GLYCOLAX) packet Take 17 g by mouth daily as needed for mild constipation.     . RESTASIS 0.05 % ophthalmic emulsion PLACE 1 DROP IN BOTH EYES IN THE EVENING  4  . sitaGLIPtin (JANUVIA) 50 MG tablet Take 50 mg by mouth daily.    . traZODone (DESYREL) 50 MG tablet TAKE 1 TABLET (50 MG TOTAL) BY MOUTH AT BEDTIME. 90 tablet 3  . venlafaxine XR (EFFEXOR-XR) 150 MG 24 hr capsule Take 1 capsule (150 mg total) by mouth daily with breakfast. 90 capsule 1  . verapamil (VERELAN PM) 120 MG 24 hr capsule TAKE 1 CAPSULE BY MOUTH EVERY DAY 90 capsule 2   No current facility-administered medications for this visit.    Facility-Administered Medications Ordered in Other Visits  Medication Dose Route Frequency Provider Last Rate Last Dose  . gadopentetate dimeglumine (MAGNEVIST) injection 15 mL  15 mL Intravenous Once PRN Melvenia Beam, MD        PHYSICAL EXAMINATION: ECOG PERFORMANCE STATUS: 1 - Symptomatic but completely ambulatory  Vitals:   08/30/19 0838  BP: 132/82  Pulse: 73  Resp: 18  Temp: 97.8 F (36.6 C)  SpO2: 100%   Filed Weights   08/30/19 0838  Weight: 166 lb (75.3 kg)    GENERAL:alert, no distress and comfortable SKIN: skin color, texture, turgor are normal, no rashes or significant lesions EYES: normal, Conjunctiva are pink and non-injected, sclera clear OROPHARYNX:no exudate, no erythema and lips, buccal mucosa, and tongue normal  NECK: supple, thyroid normal size, non-tender, without nodularity LYMPH:  no palpable lymphadenopathy in the cervical, axillary or inguinal LUNGS: clear to auscultation and percussion with normal breathing effort HEART: regular rate & rhythm and no murmurs and no lower extremity edema ABDOMEN:abdomen soft, non-tender and normal bowel  sounds Musculoskeletal:no cyanosis of digits and no clubbing  NEURO: alert & oriented x 3 with fluent speech, no focal motor/sensory deficits  LABORATORY DATA:  I have reviewed the data as listed    Component Value Date/Time   NA 139 08/23/2019 0927   NA 141 09/29/2017 0830   K 3.5 08/23/2019 0927   K 3.6 09/29/2017 0830   CL 105 08/23/2019 0927   CO2 25 08/23/2019 0927   CO2 29 09/29/2017 0830   GLUCOSE 129 (H) 08/23/2019 0927   GLUCOSE 87 09/29/2017 0830   BUN 14 08/23/2019 0927   BUN 14.0 09/29/2017 0830   CREATININE 0.92 08/23/2019 0927   CREATININE 0.9 09/29/2017 0830   CALCIUM 9.0 08/23/2019 0927   CALCIUM 9.6 09/29/2017 0830   PROT 7.4 08/23/2019 0927   PROT 7.3 09/29/2017 0832   PROT 8.0 09/29/2017 0830   ALBUMIN 3.8 08/23/2019 0927   ALBUMIN 3.8 09/29/2017 0830   AST 19 08/23/2019 0927   AST 21 09/29/2017 0830   ALT 23 08/23/2019 0927   ALT 69 (H) 09/29/2017 0830  ALKPHOS 51 08/23/2019 0927   ALKPHOS 60 09/29/2017 0830   BILITOT 0.6 08/23/2019 0927   BILITOT 0.46 09/29/2017 0830   GFRNONAA >60 08/23/2019 0927   GFRAA >60 08/23/2019 0927    No results found for: SPEP, UPEP  Lab Results  Component Value Date   WBC 3.3 (L) 08/23/2019   NEUTROABS 1.4 (L) 08/23/2019   HGB 12.0 08/23/2019   HCT 38.9 08/23/2019   MCV 74.8 (L) 08/23/2019   PLT 192 08/23/2019      Chemistry      Component Value Date/Time   NA 139 08/23/2019 0927   NA 141 09/29/2017 0830   K 3.5 08/23/2019 0927   K 3.6 09/29/2017 0830   CL 105 08/23/2019 0927   CO2 25 08/23/2019 0927   CO2 29 09/29/2017 0830   BUN 14 08/23/2019 0927   BUN 14.0 09/29/2017 0830   CREATININE 0.92 08/23/2019 0927   CREATININE 0.9 09/29/2017 0830      Component Value Date/Time   CALCIUM 9.0 08/23/2019 0927   CALCIUM 9.6 09/29/2017 0830   ALKPHOS 51 08/23/2019 0927   ALKPHOS 60 09/29/2017 0830   AST 19 08/23/2019 0927   AST 21 09/29/2017 0830   ALT 23 08/23/2019 0927   ALT 69 (H) 09/29/2017 0830    BILITOT 0.6 08/23/2019 0927   BILITOT 0.46 09/29/2017 0830       RADIOGRAPHIC STUDIES: I have personally reviewed the radiological images as listed and agreed with the findings in the report. Dg Bone Survey Met  Result Date: 08/23/2019 CLINICAL DATA:  History of multiple myeloma in remission with ongoing chemotherapy. Mid to low back pain. EXAM: METASTATIC BONE SURVEY COMPARISON:  09/21/2018 and 10/13/2016 FINDINGS: Mild spondylosis throughout the cervical, thoracic and lumbar spine. No compression fractures. No evidence of lytic or sclerotic lesions. Subtle stable sclerotic focus over the right femoral neck likely bone island. Remaining bony structures are unremarkable. IMPRESSION: No evidence of osseous metastatic disease. Electronically Signed   By: Marin Olp M.D.   On: 08/23/2019 16:03    All questions were answered. The patient knows to call the clinic with any problems, questions or concerns. No barriers to learning was detected.  I spent 15 minutes counseling the patient face to face. The total time spent in the appointment was 20 minutes and more than 50% was on counseling and review of test results  Heath Lark, MD 08/30/2019 9:00 AM

## 2019-08-30 NOTE — Research (Signed)
Research - [facility] ECOG-ACRIN [id number] Maintenance Cycle 61 Patient in to clinic unaccompanied today for evaluation prior to beginning treatment Cycle 61.  Quality of Life Assessment - Upon arrival to clinic, patient completed her patient reported outcomes questionnaires independently.  Labs w/myeloma panel - Obtained on 08/23/2019 within 7-day window per protocol, to allow adequate time for results prior to treatment. Results reviewed by Dr. Alvy Bimler and found to be within parameters for continued dosing at Maintenance phase Dose Level -3, Revlimid 5 mg every other day for 21 days, every 28 days. Review of myeloma panel including 24-Hr Urine for UPEP/UIFE/Light Chains/TP revealed evidence of Complete Response. Per MD, bone marrow biopsy and aspirate would not be considered standard of care at this time.   See MD note for history and physical exam. Patient reports side effects over the past month including her usual complaints of back and bone pain, ongoing fatigue, insomnia, and peripheral sensory neuropathy, all of which are essentially unchanged in character or intensity. She is fairly active around the house, which contributes to pain and fatigue. She notes that she has been taking no pain pills, other than dose of tramadol for migraine this month, and is rarely taking a sleeping pill, though her symptoms of pain and insomnia have not changed significantly. She continues to ride the exercise bike for over 30 minutes three times a week. She notes occasional days with grade 1 diarrhea (max 3 loose stools/24 hours) which she associates with either vegetable or dairy intake. Patient had no other complaints to report.  Based on lab results review and history and physical by Dr. Alvy Bimler, patient meets criteria for continued treatment with Revlimid, which she is in agreement to do. Patient is aware that treatment will continue indefinitely, unless there is evidence of disease recurrence, adverse effects requiring  treatment discontinuation, or patient/physician decision to discontinue therapy.  Patient returned completed cycle 60 Medication Calendar, confirming dosing at dose level -3, Revlimid 38m every other day, Days 1-21, with no missed doses. Patient given printed appointment calendar with Cycle 61 treatment days marked, to document doses for the next treatment cycle, which will begin today. Patient is aware that she should contact the office at any time for any symptoms or problems of concern.  Patient will continue with monthly clinic visits on 09/27/2019 and 10/25/2019 for hematology and AE assessments prior to beginning subsequent treatment cycles. Myeloma panel and other labs to be performed on 11/15/2019 in advance of the next MD clinic visit at the end of Cycle 63. Due to MD out of office the week of Cycle 63, patient will be seen by PI, or will be seen on the Friday prior to treatment, if allowed per protocol.   Cindy S. SBrigitte PulseBSN, RN, CCobb9/29/2020 10:17 AM

## 2019-08-30 NOTE — Assessment & Plan Note (Signed)
She has minimum pain and has not taken any pain medicine lately She will continue calcium and vitamin D for bone health

## 2019-08-30 NOTE — Assessment & Plan Note (Signed)
She is concerned about recent elevated blood sugar and hemoglobin A1c We discussed dietary modification and weight loss strategy She will continue close follow-up with her primary care doctor for medical management

## 2019-08-30 NOTE — Assessment & Plan Note (Signed)
This is likely due to recent treatment. The patient denies recent history of fevers, cough, chills, diarrhea or dysuria. She is asymptomatic from the leukopenia. I will observe for now.  I will continue the chemotherapy at current dose without dosage adjustment.  If the leukopenia gets progressive worse in the future, I might have to delay her treatment or adjust the chemotherapy dose per protocol

## 2019-08-30 NOTE — Assessment & Plan Note (Signed)
She continues to have chronic diarrhea on and off Her diarrhea is made worse when she is on oral hypoglycemics Certainly, Revlimid can contribute to diarrhea but it is not clear to me that is the main reason why she has diarrhea She will continue Imodium as needed

## 2019-08-30 NOTE — Assessment & Plan Note (Signed)
I reviewed test results with her  The patient is comfortable to remain on Revlimid indefinitely. She will continue treatment per research protocol She will continue calcium with vitamin D supplement along with aspirin for DVT prophylaxis. 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 I will see her back as scheduled Her recent blood work and 24-hour urine collection and skeletal survey suggested complete remission

## 2019-08-31 ENCOUNTER — Telehealth: Payer: Self-pay | Admitting: Hematology and Oncology

## 2019-08-31 NOTE — Telephone Encounter (Signed)
I talk with patient regarding schedule  

## 2019-09-06 NOTE — Research (Addendum)
Adverse Event Log Study/Protocol: [facility] ECOG E1A11 Maintenance Cycles 58-60: 06/07/2019 - 08/30/2019 (end of Cycle 60 = [id number])  Solicited &/or Reportable Events Grade Attribution to lenalidomide Cycle # Comments  Anemia Grade 1 Definite 58-59    Hyperglycemia Grade 1 Unrelated 60   Lymphocyte count decreased Grade 0 -    Neutrophil count decreased Grade 1-2  Definite 58-60 Cycle 59 = Grade 1 Cycles 58,56 = Grade 2  Platelet count decreased Grade 0 -     Diarrhea Grade 1 Probable 58-60   Dyspnea Grade 0 -    Edema: limbs Grade 0 -    Fatigue  Grade 2  Probable 58-60 Moderate, occasionally limiting ADLs (unchanged from previous)  Fever Grade 0 -    Insomnia  Grade 1-2   Unlikely 58-60 Moderate difficulty; rare use of sleeping pill  Irritability Grade 0 -    Muscle weakness trunk Grade 0 -    Nausea Grade 0 -    Peripheral motor neuropathy Grade 0     Peripheral sensory neuropathy  Grade 2 Unlikely 58-60 Moderate symptoms, unchanged from previous  Rash acneiform Grade 0 -    Vomiting Grade 0 -    Non-reportable AEs (unsolicited, < Grade 3): - Leukopenia (1) - Back pain (2) - Bone pain (2) - Leg pain (2) - Knee pain (2) - Hip pain (2) - Headache/migraine (2) - Abdominal pain - associated w/diarrhea (1) - Weight loss (1) - Hypertension (1)  Carla Perry BSN, RN, [place] 09/06/2019 12:49 PM

## 2019-09-10 ENCOUNTER — Other Ambulatory Visit: Payer: Self-pay | Admitting: Neurology

## 2019-09-15 ENCOUNTER — Other Ambulatory Visit: Payer: Self-pay | Admitting: Hematology and Oncology

## 2019-09-15 DIAGNOSIS — C9001 Multiple myeloma in remission: Secondary | ICD-10-CM

## 2019-09-15 NOTE — Telephone Encounter (Signed)
-

## 2019-09-20 ENCOUNTER — Other Ambulatory Visit: Payer: Self-pay | Admitting: Hematology and Oncology

## 2019-09-27 ENCOUNTER — Other Ambulatory Visit: Payer: Medicare Other

## 2019-09-27 ENCOUNTER — Inpatient Hospital Stay: Payer: Medicare Other | Admitting: *Deleted

## 2019-09-27 ENCOUNTER — Inpatient Hospital Stay: Payer: Medicare Other | Attending: Hematology and Oncology

## 2019-09-27 ENCOUNTER — Other Ambulatory Visit: Payer: Self-pay

## 2019-09-27 DIAGNOSIS — C9001 Multiple myeloma in remission: Secondary | ICD-10-CM

## 2019-09-27 DIAGNOSIS — Z006 Encounter for examination for normal comparison and control in clinical research program: Secondary | ICD-10-CM | POA: Diagnosis present

## 2019-09-27 LAB — CBC WITH DIFFERENTIAL (CANCER CENTER ONLY)
Abs Immature Granulocytes: 0.01 10*3/uL (ref 0.00–0.07)
Basophils Absolute: 0 10*3/uL (ref 0.0–0.1)
Basophils Relative: 1 %
Eosinophils Absolute: 0.1 10*3/uL (ref 0.0–0.5)
Eosinophils Relative: 2 %
HCT: 38.1 % (ref 36.0–46.0)
Hemoglobin: 11.9 g/dL — ABNORMAL LOW (ref 12.0–15.0)
Immature Granulocytes: 0 %
Lymphocytes Relative: 49 %
Lymphs Abs: 1.9 10*3/uL (ref 0.7–4.0)
MCH: 23 pg — ABNORMAL LOW (ref 26.0–34.0)
MCHC: 31.2 g/dL (ref 30.0–36.0)
MCV: 73.7 fL — ABNORMAL LOW (ref 80.0–100.0)
Monocytes Absolute: 0.5 10*3/uL (ref 0.1–1.0)
Monocytes Relative: 13 %
Neutro Abs: 1.4 10*3/uL — ABNORMAL LOW (ref 1.7–7.7)
Neutrophils Relative %: 35 %
Platelet Count: 211 10*3/uL (ref 150–400)
RBC: 5.17 MIL/uL — ABNORMAL HIGH (ref 3.87–5.11)
RDW: 14.7 % (ref 11.5–15.5)
WBC Count: 3.9 10*3/uL — ABNORMAL LOW (ref 4.0–10.5)
nRBC: 0 % (ref 0.0–0.2)

## 2019-09-27 MED ORDER — LENALIDOMIDE 5 MG PO CAPS CTSU E1A11: ORAL_CAPSULE | ORAL | 0 refills | Status: DC

## 2019-09-27 NOTE — Research (Signed)
09/27/2019 Research - [facility] ECOG-ACRIN [id number] Maintenance Cycle 61 Patient into clinic unaccompanied today for evaluation prior to beginning treatment Cycle 62.   Maintenance therapy with Revlimid:  Patient returned completed cycle 61 Medication Calendar, confirming dosing at dose level -3, Revlimid 5mg  every other day, Days 1-21. Patient notes that no doses were missed. Patient given printed appointment calendar with Cycle 62 treatment days marked, to document doses for the next treatment cycle, scheduled to begin today.  Adverse Events: Patient reports that fatigue was worse over the past month, however, she had significant activity over the month, including painting the porch and exercising regularly with increased duration. Fatigue did not limit self-care (grade 2). She reports taking no sleeping pills over the past month, although she generally continues to have moderate difficulty sleeping on a regular basis. She reports ongoing daily backache (moderate, grade 2), and knee pain, noted one day, also moderate in nature. Patient reports she took no pain pills over the past month. She reports one day of diarrhea, occurring yesterday, with a total of four stools (grade 1). Patient felt it might be related to increased vegetables in her diet. Noted that patient should contact the [facility] if diarrhea worsens as treatment with Revlimid may need to be held temporarily. Follow-up with PCP regarding previously noted increased glucose (non-fasting) revealed HgbA1c within target range.   Following patient's departure from clinic, CBC results were received, showing ANC of 1400 with other values within parameters for retreatment. Spoke with patient's husband by phone and requested that he notify patient that lab results are acceptable for treatment to begin today. Mr. Pande confirmed this information.  [name] BSN, RN, [place] 09/27/2019 8:22 AM

## 2019-09-29 NOTE — Research (Signed)
09/29/2019 Adverse Event Log Study/Protocol: [facility] ECOG E1A11 Maintenance Cycle 61: 08/30/2019 - 09/27/2019 (end of Cycle = [id number]) Solicited &/or Reportable Events Grade Comments  Anemia Grade 1    Hyperglycemia n/a Not assessed this cycle  Lymphocyte count decreased Grade 0   Neutrophil count decreased Grade 2     Platelet count decreased Grade 0     Diarrhea Grade 1 One day with 4 stools/24hr  Dyspnea Grade 0   Edema: limbs Grade 0   Fatigue  Grade 2  Moderate, occasionally limiting ADLs (unchanged from previous)  Fever Grade 0   Insomnia  Grade 2  Moderate difficulty  Irritability Grade 0   Muscle weakness trunk Grade 0   Nausea Grade 0   Peripheral motor neuropathy Grade 0   Peripheral sensory neuropathy  Grade 2 Moderate symptoms, unchanged from previous  Rash acneiform Grade 0   Vomiting Grade 0   Non-reportable AEs - unsolicited, < Grade 3 (Grade): - Leukopenia (1) - Headache (2) - Moderate pain [grade 2]  Back pain  Knee pain [name] BSN, RN, CCRP 09/29/2019 1:20 PM

## 2019-10-13 ENCOUNTER — Other Ambulatory Visit: Payer: Self-pay | Admitting: Hematology and Oncology

## 2019-10-13 DIAGNOSIS — C9001 Multiple myeloma in remission: Secondary | ICD-10-CM

## 2019-10-13 NOTE — Telephone Encounter (Signed)
-

## 2019-10-20 ENCOUNTER — Other Ambulatory Visit: Payer: Self-pay | Admitting: Obstetrics & Gynecology

## 2019-10-20 DIAGNOSIS — Z1231 Encounter for screening mammogram for malignant neoplasm of breast: Secondary | ICD-10-CM

## 2019-10-25 ENCOUNTER — Inpatient Hospital Stay: Payer: Medicare Other | Admitting: *Deleted

## 2019-10-25 ENCOUNTER — Telehealth: Payer: Self-pay | Admitting: *Deleted

## 2019-10-25 ENCOUNTER — Other Ambulatory Visit: Payer: Self-pay

## 2019-10-25 ENCOUNTER — Encounter: Payer: Self-pay | Admitting: Medical Oncology

## 2019-10-25 ENCOUNTER — Emergency Department (HOSPITAL_COMMUNITY)
Admission: EM | Admit: 2019-10-25 | Discharge: 2019-10-25 | Disposition: A | Discharge status: Home / Self Care | Payer: Medicare Other | Attending: Emergency Medicine | Admitting: Emergency Medicine

## 2019-10-25 ENCOUNTER — Other Ambulatory Visit: Payer: Medicare Other

## 2019-10-25 ENCOUNTER — Inpatient Hospital Stay: Payer: Medicare Other | Attending: Hematology and Oncology

## 2019-10-25 ENCOUNTER — Emergency Department (HOSPITAL_COMMUNITY): Payer: Medicare Other

## 2019-10-25 ENCOUNTER — Encounter (HOSPITAL_COMMUNITY): Payer: Self-pay

## 2019-10-25 DIAGNOSIS — D61811 Other drug-induced pancytopenia: Secondary | ICD-10-CM | POA: Insufficient documentation

## 2019-10-25 DIAGNOSIS — Z006 Encounter for examination for normal comparison and control in clinical research program: Secondary | ICD-10-CM | POA: Insufficient documentation

## 2019-10-25 DIAGNOSIS — G43709 Chronic migraine without aura, not intractable, without status migrainosus: Secondary | ICD-10-CM | POA: Insufficient documentation

## 2019-10-25 DIAGNOSIS — C9001 Multiple myeloma in remission: Secondary | ICD-10-CM

## 2019-10-25 DIAGNOSIS — E119 Type 2 diabetes mellitus without complications: Secondary | ICD-10-CM | POA: Diagnosis not present

## 2019-10-25 DIAGNOSIS — Z79899 Other long term (current) drug therapy: Secondary | ICD-10-CM | POA: Diagnosis not present

## 2019-10-25 DIAGNOSIS — Z8673 Personal history of transient ischemic attack (TIA), and cerebral infarction without residual deficits: Secondary | ICD-10-CM | POA: Insufficient documentation

## 2019-10-25 DIAGNOSIS — Z7984 Long term (current) use of oral hypoglycemic drugs: Secondary | ICD-10-CM | POA: Diagnosis not present

## 2019-10-25 DIAGNOSIS — Z8669 Personal history of other diseases of the nervous system and sense organs: Secondary | ICD-10-CM | POA: Diagnosis not present

## 2019-10-25 DIAGNOSIS — R519 Headache, unspecified: Secondary | ICD-10-CM | POA: Diagnosis not present

## 2019-10-25 DIAGNOSIS — I10 Essential (primary) hypertension: Secondary | ICD-10-CM | POA: Insufficient documentation

## 2019-10-25 DIAGNOSIS — C9000 Multiple myeloma not having achieved remission: Secondary | ICD-10-CM | POA: Insufficient documentation

## 2019-10-25 DIAGNOSIS — Z7982 Long term (current) use of aspirin: Secondary | ICD-10-CM | POA: Diagnosis not present

## 2019-10-25 LAB — CBC WITH DIFFERENTIAL (CANCER CENTER ONLY)
Abs Immature Granulocytes: 0.01 10*3/uL (ref 0.00–0.07)
Basophils Absolute: 0 10*3/uL (ref 0.0–0.1)
Basophils Relative: 1 %
Eosinophils Absolute: 0.1 10*3/uL (ref 0.0–0.5)
Eosinophils Relative: 2 %
HCT: 37.7 % (ref 36.0–46.0)
Hemoglobin: 11.6 g/dL — ABNORMAL LOW (ref 12.0–15.0)
Immature Granulocytes: 0 %
Lymphocytes Relative: 50 %
Lymphs Abs: 1.9 10*3/uL (ref 0.7–4.0)
MCH: 22.9 pg — ABNORMAL LOW (ref 26.0–34.0)
MCHC: 30.8 g/dL (ref 30.0–36.0)
MCV: 74.4 fL — ABNORMAL LOW (ref 80.0–100.0)
Monocytes Absolute: 0.5 10*3/uL (ref 0.1–1.0)
Monocytes Relative: 13 %
Neutro Abs: 1.3 10*3/uL — ABNORMAL LOW (ref 1.7–7.7)
Neutrophils Relative %: 34 %
Platelet Count: 191 10*3/uL (ref 150–400)
RBC: 5.07 MIL/uL (ref 3.87–5.11)
RDW: 15 % (ref 11.5–15.5)
WBC Count: 3.7 10*3/uL — ABNORMAL LOW (ref 4.0–10.5)
nRBC: 0 % (ref 0.0–0.2)

## 2019-10-25 NOTE — Research (Unsigned)
Research - [facility] ECOG-ACRIN [id number] Maintenance Cycle 62  0745: I met with patient this morning, who is here alone, after her labs were collected. I informed patient that I was covering her visit today for research nurse, Tyrell Antonio. I reviewed with patient the symptom check list and informed patient once labs resulted that I would call her with the results. Patient provided me with her previous dispensed calendars noting any adverse events she had over the last cycle. I provided patient with her next cycle calendars for documentation. Patient also informed me that she had received a second shingles vaccination injection on Friday 11/20th. I confirmed with patient that she knows to wait to take study drug until she hears from me regarding labs results. Patient informed me that I could leave a message on the home phone if no one answers. I thanked patient and provided her my contact information.   0836: I called patient to obtain a clarification regarding a migraine she described. Husband stated patient isn't home and would give her message to call me back.   0917: Patient returned call and I spoke with patient regarding a migraine, she documented on her calendar, that she had on 10/15/19. I asked patient to clarify her description of "migraine felt like small stroke, Left side numb, face arm, leg, small headache." I inquired with patient why she did not proceed to the ED for evaluation and patient states that she has had something similar in the past when the headache felt like a "7" on pain scale, and that the past ED visit treated her symptoms as a migraine. Patient states the headache on the 14th was a "5" and patient states she did not want to go to ED. I asked patient to describe what she experienced and patient states "it felt like a tiny clot that started in her left leg and made its way up to my head." patient states the headache went away with 2 tramadol's. I informed patient that even though her labs  resulted within treatment levels, she cannot restart the Revlimid. I encouraged her to proceed to the ED for further evaluation and patient replied that she didn't feel that was necessary. Patient was informed that blood clots are a rare but serious side effect of Revlimid and it is important that she be evaluated regarding the event she described. Patient states that she is aware of possible blood clots, states she had a DVT in the past. I instructed patient again to not restart Revlimid and that I will follow-up with her shortly,  she gave verbal understanding to this. I reviewed the above information with Dr. Alvy Bimler and per MD, the "migraine" is indeterminate as to rather patient's "migraine" report from 11/14 is related to the Revlimid and patient needs to proceed to ED for further evaluation and that Revlimid treatment is on hold indefinitely.   1130: I provided research nurse, Tyrell Antonio, with an update regarding patient's AE report and the above. Cindy to follow-up with call to patient today for further evaluation and follow-up.   Medication Calendar: Patient returned completed Medication calendar for cycle 62. Patient confirms taking Revlimid 5 mg every other day and no missed days noted.   Adverse Events: Patient confirms that she continues with fatigue, insomnia and peripheral sensory neuropathy but it has not worsened from her previous cycle. Patient reports two migraine episodes, 11/14 (discussed above) and one following on the 16th that she attributes to waking up "suddenly while dreaming." Adverse events updated below  for change. Patient reports one day of "severe bone pain" all day and notes she had exercised that day.   Maxwell Marion, RN, BSN, [facility] 10/25/2019 2:51 PM   Adverse Event Log Study/Protocol: [facility] ECOG [id number] Maintenance Cycle 62: 09/27/2019 - 10/25/2019 (end of Cycle = 32/34/6887) Solicited &/or Reportable Events Grade Comments  Anemia Grade 1     Hyperglycemia n/a Not assessed this cycle  Lymphocyte count decreased Grade 0   Neutrophil count decreased Grade 2     Platelet count decreased Grade 0     Diarrhea Grade 0 None this cycle   Dyspnea Grade 0   Edema: limbs Grade 0   Fatigue  Grade 2  Moderate, occasionally limiting ADLs (unchanged from previous)  Fever Grade 0   Insomnia  Grade 2  Moderate difficulty  Irritability Grade 0   Muscle weakness trunk Grade 0   Nausea Grade 0   Peripheral motor neuropathy Grade 0   Peripheral sensory neuropathy  Grade 2 Moderate symptoms, unchanged from previous  Rash acneiform Grade 0   Vomiting Grade 0   Non-reportable AEs - unsolicited, < Grade 3 (Grade): - Bone Pain (3) 10/12/19 - Leukopenia (1) - Headache (2) - Moderate pain [grade 2]  Back pain  Knee pain

## 2019-10-25 NOTE — ED Triage Notes (Signed)
Patient states she had a headache on 10/15/19 and again occurred today. Patient denies any blurred vision, nausea or sensitivity to light. Patient is taking a trial oral chemo pill and when she went to get labs done today she was told to get checked before she took another dose of oral chemo.

## 2019-10-25 NOTE — Discharge Instructions (Addendum)
Get help right away if: Your migraine headache becomes severe. Your migraine headache lasts longer than 72 hours. You have a fever. You have a stiff neck. You have vision loss. Your muscles feel weak or like you cannot control them. You start to lose your balance often. You have trouble walking. You faint. You have a seizure.

## 2019-10-25 NOTE — ED Provider Notes (Signed)
[facility] DEPT Provider Note   CSN: [id number] Arrival date & time: 10/25/19  1215     History   Chief Complaint Chief Complaint  Patient presents with  . Headache  . cancer patient    HPI Carla Perry is a 63 y.o. female who  has a past medical history of Abnormal thyroid function test (08/30/2014), Anemia, Anemia, unspecified (10/06/2013), Bone pain (10/21/2013), Bronchitis (01/31/2015), Diabetes mellitus without complication ([place]) (50/3888), Diverticulitis (07/18/2014), DVT (deep venous thrombosis) ([place]) (02/07/2014), HBP (high blood pressure), Insomnia (02/11/2016), Leukopenia due to antineoplastic chemotherapy ([place]) (12/27/2013), Memory loss, MGUS (monoclonal gammopathy of unknown significance), MGUS (monoclonal gammopathy of unknown significance) (10/06/2013), Migraine, Multiple myeloma, without mention of having achieved remission (11/16/2013), Pancytopenia ([place]) (12/27/2015), Peripheral neuropathy (12/27/2015), Personal history of chemotherapy, Right leg swelling (02/07/2014), Seizure ([place]) (1960), Thyroid disorder, and Vitamin D deficiency (10/24/2014).    Patient has a hx of hemiplegic migraine headaches and complex migraines.  She was sent into the emergency department for evaluation of headache today by her oncology nurse.  Patient states that 10 days ago she had a complex migraine headache described as left-sided with facial numbness and weakness on the left side which is typical of her migraine headache.  She states that she was told that she should have come to the emergency department immediately on that day.  She is having a left-sided headache today without neurologic symptoms.  She did not take anything for this.  She is followed by neurology and is on 2 preventive medications and has a prescription for tramadol when she has migraine headaches.  She was told that she either had to come to the ER today or follow-up with her neurologist before she can  continue with her treatments for her multiple myeloma.  She currently rates her headache at 4 out of 10.  She denies photosensitivity, nausea, vomiting, phonophobia or other neurologic abnormalities.    HPI  Past Medical History:  Diagnosis Date  . Abnormal thyroid function test 08/30/2014  . Anemia   . Anemia, unspecified 10/06/2013  . Bone pain 10/21/2013  . Bronchitis 01/31/2015  . Diabetes mellitus without complication ([place]) 28/0034   Steroid induced diabetes. has not picked oral med up from pharmacy as of 09-14-14  . Diverticulitis 07/18/2014  . DVT (deep venous thrombosis) ([place]) 02/07/2014  . HBP (high blood pressure)   . Insomnia 02/11/2016  . Leukopenia due to antineoplastic chemotherapy ([place]) 12/27/2013  . Memory loss   . MGUS (monoclonal gammopathy of unknown significance)   . MGUS (monoclonal gammopathy of unknown significance) 10/06/2013  . Migraine   . Multiple myeloma, without mention of having achieved remission 11/16/2013  . Pancytopenia ([place]) 12/27/2015  . Peripheral neuropathy 12/27/2015  . Personal history of chemotherapy   . Right leg swelling 02/07/2014  . Seizure ([place]) 1960   single seizure episode at age 67  . Thyroid disorder   . Vitamin D deficiency 10/24/2014    Patient Active Problem List   Diagnosis Date Noted  . Essential hypertension 07/06/2018  . Atypical chest pain 06/29/2018  . B12 deficiency 03/30/2018  . Benign skin lesion of cheek 01/06/2018  . Elevated liver enzymes 10/05/2017  . Rash in adult 08/04/2017  . Cancer associated pain 01/13/2017  . Chemotherapy induced neutropenia ([place]) 12/25/2016  . Lung nodule seen on imaging study 12/25/2016  . Granuloma, pyogenic 09/03/2016  . Lumbar pain 08/27/2016  . Paresthesias 03/19/2016  . Headache 03/19/2016  . Insomnia 02/11/2016  . Peripheral  neuropathy 12/27/2015  . Pancytopenia ([place]) 12/27/2015  . Disability examination 03/22/2015  . Migraine without aura 02/16/2015  . Neuropathy due to  chemotherapeutic drug ([place]) 02/16/2015  . Influenza B 01/31/2015  . Diarrhea 12/26/2014  . Malignant bone pain 12/26/2014  . Vitamin D deficiency 10/24/2014  . Diabetes mellitus without complication ([place]) 40/9811  . Abnormal thyroid function test 08/30/2014  . Fatigue 08/23/2014  . Generalized postprandial abdominal pain 08/22/2014  . Hypokalemia 07/25/2014  . Diverticulitis 07/18/2014  . Hematochezia due to medication 05/23/2014  . Neck pain on right side 05/02/2014  . 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 Drug-induced pancytopenia ([place]) 05/02/2014  . Right leg swelling 02/07/2014  . DVT (deep venous thrombosis) ([place]) 02/07/2014  . Multiple myeloma in remission ([place]) 11/16/2013  . Bone pain 10/21/2013  . 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 Intractable chronic migraine without aura 06/28/2013    Past Surgical History:  Procedure Laterality Date  . HEMORRHOID SURGERY    . PORT-A-CATH REMOVAL  10-2014  . PORTACATH PLACEMENT Right jan 2015     OB History   No obstetric history on file.      Home Medications    Prior to Admission medications   Medication Sig Start Date End Date Taking? Authorizing Provider  aspirin 325 MG tablet Take 325 mg by mouth daily.  07/09/18   Heath Lark, MD  CANNABIDIOL PO Take 500 mg by mouth as needed.    [provider]  Cholecalciferol (VITAMIN D3) 2000 UNITS TABS Take 2,000 Units by mouth daily.    [provider]  hydrochlorothiazide (HYDRODIURIL) 25 MG tablet Take 25 mg by mouth every evening.     [provider]  lenalidomide (REVLIMID) 5 MG capsule TAKE 1 CAPSULE BY MOUTH  EVERY OTHER DAY FOR 21 DAYS ON THEN 7 DAYS OFF 10/13/19   Heath Lark, MD  levothyroxine (SYNTHROID, LEVOTHROID) 75 MCG tablet Take 75 mcg by mouth daily before breakfast.    [provider]  methocarbamol (ROBAXIN) 500 MG tablet Take 1 tablet (500 mg total) by mouth every 8 (eight) hours as needed for muscle spasms.  03/30/18   Heath Lark, MD  Multiple Vitamins-Minerals (CENTRUM SILVER PO) Take 1 tablet by mouth daily.     [provider]  oxyCODONE (ROXICODONE) 15 MG immediate release tablet Take 1 tablet (15 mg total) by mouth 3 (three) times daily as needed for pain. 03/15/19   Heath Lark, MD  polyethylene glycol (MIRALAX / GLYCOLAX) packet Take 17 g by mouth daily as needed for mild constipation.     [provider]  RESTASIS 0.05 % ophthalmic emulsion PLACE 1 DROP IN BOTH EYES IN THE EVENING 07/28/16   [provider]  sitaGLIPtin (JANUVIA) 50 MG tablet Take 50 mg by mouth daily. 10/15/18   [provider]  traZODone (DESYREL) 50 MG tablet TAKE 1 TABLET (50 MG TOTAL) BY MOUTH AT BEDTIME. 04/01/19   Heath Lark, MD  venlafaxine XR (EFFEXOR-XR) 150 MG 24 hr capsule Take 1 capsule (150 mg total) by mouth daily with breakfast. 11/03/18   Ward Givens, NP  verapamil (VERELAN PM) 120 MG 24 hr capsule TAKE 1 CAPSULE BY MOUTH EVERY DAY 09/11/19   Melvenia Beam, MD    Family History Family History  Problem Relation Age of Onset  . Throat cancer Father   . Stroke Father   . Cancer Brother        prostate  . Breast cancer Neg Hx  Social History Social History   Tobacco Use  . Smoking status: Never Smoker  . Smokeless tobacco: Never Used  Substance Use Topics  . Alcohol use: No    Alcohol/week: 0.0 standard drinks  . Drug use: No     Allergies   Patient has no known allergies.   Review of Systems Review of Systems Ten systems reviewed and are negative for acute change, except as noted in the HPI.    Physical Exam Updated Vital Signs BP 134/80   Pulse 76   Temp 98.2 F (36.8 C)   Resp 15   Ht 5' 7" (1.702 m)   Wt 75.8 kg   SpO2 100%   BMI 26.16 kg/m   Physical Exam Vitals signs and nursing note reviewed.  Constitutional:      General: She is not in acute distress.    Appearance: She is well-developed. She is not diaphoretic.  HENT:      Head: Normocephalic and atraumatic.  Eyes:     General: No scleral icterus.    Conjunctiva/sclera: Conjunctivae normal.     Pupils: Pupils are equal, round, and reactive to light.     Comments: No horizontal, vertical or rotational nystagmus  Neck:     Musculoskeletal: Normal range of motion and neck supple.     Comments: Full active and passive ROM without pain No midline or paraspinal tenderness No nuchal rigidity or meningeal signs Cardiovascular:     Rate and Rhythm: Normal rate and regular rhythm.  Pulmonary:     Effort: Pulmonary effort is normal. No respiratory distress.     Breath sounds: Normal breath sounds. No wheezing or rales.  Abdominal:     General: Bowel sounds are normal.     Palpations: Abdomen is soft.     Tenderness: There is no abdominal tenderness. There is no guarding or rebound.  Musculoskeletal: Normal range of motion.  Lymphadenopathy:     Cervical: No cervical adenopathy.  Skin:    General: Skin is warm and dry.     Findings: No rash.  Neurological:     Mental Status: She is alert and oriented to person, place, and time.     Cranial Nerves: No cranial nerve deficit.     Motor: No abnormal muscle tone.     Coordination: Coordination normal.     Comments: Mental Status:  Alert, oriented, thought content appropriate. Speech fluent without evidence of aphasia. Able to follow 2 step commands without difficulty.  Cranial Nerves:  II:  Peripheral visual fields grossly normal, pupils equal, round, reactive to light III,IV, VI: ptosis not present, extra-ocular motions intact bilaterally  V,VII: smile symmetric, facial light touch sensation equal VIII: hearing grossly normal bilaterally  IX,X: midline uvula rise  XI: bilateral shoulder shrug equal and strong XII: midline tongue extension  Motor:  5/5 in upper and lower extremities bilaterally including strong and equal grip strength and dorsiflexion/plantar flexion Sensory: Pinprick and light touch normal  in all extremities.  Cerebellar: normal finger-to-nose with bilateral upper extremities Gait: normal gait and balance CV: distal pulses palpable throughout   Psychiatric:        Behavior: Behavior normal.        Thought Content: Thought content normal.        Judgment: Judgment normal.      ED Treatments / Results  Labs (all labs ordered are listed, but only abnormal results are displayed) Labs Reviewed - No data to display  EKG None  Radiology No results  found.  Procedures Procedures (including critical care time)  Medications Ordered in ED Medications - No data to display   Initial Impression / Assessment and Plan / ED Course  I have reviewed the triage vital signs and the nursing notes.  Pertinent labs & imaging results that were available during my care of the patient were reviewed by me and considered in my medical decision making (see chart for details).        Is a 63 year old female who has a history of multiple myeloma and well established history of hemiplegic migraines, chronic headaches, followed in outpatient neurologic setting with preventive medications and tramadol for breakthrough headaches.  Patient was advised to come to the ER for a mild, left-sided headache with no neurologic deficits because she had a headache 10 days ago with neurologic deficits and did not come then.  Patient has refused any pain medications.  She states she can take Tylenol when she gets to the house.  She has a normal neurologic examination without any deficits.  CT scan today shows no abnormalities on my interpretation.  Do not feel she needs any further evaluation at this time.  I have very low suspicion for space containing lesion given her well-documented history of headaches.  Had a long discussion with the patient explaining the physiology of the body which includes that she has no way to get a clot from her leg into her brain unless she has some sort of hole in her heart like  patent foramen ovale.  I have low suspicion for other clotting abnormalities from her medication such as dural venous thrombosis.  Her head is headache is unilateral, insistent with her normal headaches.  She did not have a headache yesterday.  Discussed outpatient follow-up, return precautions.  Final Clinical Impressions(s) / ED Diagnoses   Final diagnoses:  Mild headache  Hx of atypical migraine    ED Discharge Orders    None       Margarita Mail, PA-C 10/25/19 2114    Veryl Speak, MD 10/26/19 0830

## 2019-10-25 NOTE — Telephone Encounter (Signed)
10/25/2019 Spoke with patient by phone this morning following discussions between Alianza and Dr. Alvy Bimler regarding patient symptoms occurring about 10 days ago. It was recommended that patient be evaluated in the ED for concerning symptoms, although patient states in some way it seemed similar to her typical migraine headaches. It was also recommended to patient that she follow up with her neurologist regarding the symptoms that occurred so that she can be evaluated earlier than her regularly scheduled appointment on 11/07/2019. After discussion with patient, she did seem to agree that her husband could take her to the ED for further evaluation. Reminded patient that she should continue to seek care in the ED or with other healthcare providers in spite of the COVID-19 pandemic.  Patient understands that she will not continue Revlimid treatment at this time, since based on her physician's assessment, risks of continuing therapy likely outweigh any potential benefits. Patient voiced understanding. Explained to patient that I would be reviewing the protocol for next steps at treatment discontinuation and that our office would contact her regarding upcoming oncology appointments for follow-up. Patient verbalized understanding of this plan.

## 2019-10-26 ENCOUNTER — Telehealth: Payer: Self-pay | Admitting: Hematology and Oncology

## 2019-10-26 ENCOUNTER — Telehealth: Payer: Self-pay | Admitting: Medical Oncology

## 2019-10-26 NOTE — Telephone Encounter (Signed)
Patient called this morning informing me that she had been to the ED yesterday and was evaluated and sent home. Patient inquiring if she can restart Revlimid tomorrow.  I reviewed patient's message with Dr. Alvy Bimler. MD would like to see patient for assessment on Monday 11/30 for evaluation prior to patient restarting Revlimid. Patient was informed, per MD, that she is not to start Revlimid, MD to assess patient on Monday 11/30 and that scheduling will reach out to patient for appointment. Patient gave verbal understanding and confirmation that she will not start Revlimid.  Patient thanked and encouraged to call with questions.  Maxwell Marion, RN, BSN, [facility] 10/26/2019 10:11 AM

## 2019-10-26 NOTE — Telephone Encounter (Signed)
Scheduled appt per 11/25 sch message - pt aware of appt date and time

## 2019-10-31 ENCOUNTER — Encounter: Payer: Self-pay | Admitting: Hematology and Oncology

## 2019-10-31 ENCOUNTER — Other Ambulatory Visit: Payer: Self-pay

## 2019-10-31 ENCOUNTER — Encounter: Payer: Self-pay | Admitting: *Deleted

## 2019-10-31 ENCOUNTER — Inpatient Hospital Stay: Payer: Medicare Other | Admitting: Hematology and Oncology

## 2019-10-31 ENCOUNTER — Telehealth: Payer: Self-pay | Admitting: Hematology and Oncology

## 2019-10-31 DIAGNOSIS — C9001 Multiple myeloma in remission: Secondary | ICD-10-CM

## 2019-10-31 DIAGNOSIS — Z006 Encounter for examination for normal comparison and control in clinical research program: Secondary | ICD-10-CM

## 2019-10-31 DIAGNOSIS — G43709 Chronic migraine without aura, not intractable, without status migrainosus: Secondary | ICD-10-CM | POA: Diagnosis not present

## 2019-10-31 DIAGNOSIS — D61811 Other drug-induced pancytopenia: Secondary | ICD-10-CM

## 2019-10-31 DIAGNOSIS — G43719 Chronic migraine without aura, intractable, without status migrainosus: Secondary | ICD-10-CM

## 2019-10-31 DIAGNOSIS — Z8673 Personal history of transient ischemic attack (TIA), and cerebral infarction without residual deficits: Secondary | ICD-10-CM | POA: Diagnosis not present

## 2019-10-31 NOTE — Assessment & Plan Note (Addendum)
After extensive review, I do not believe her recent symptoms are due to stroke The patient is comfortable to remain on Revlimid indefinitely. She will continue treatment per research protocol I will see her back as scheduled The research nurse is informed Her recent blood work and 24-hour urine collection and skeletal survey suggested complete remission

## 2019-10-31 NOTE — Assessment & Plan Note (Signed)
She had recent severe migraine headaches prompting urgent evaluation in the ER to rule out stroke She has no residual symptoms today She has appointment to visit with her neurologist in 6 weeks I recommend her to discuss with her neurologist about intervention to reduce the frequency and severity of her migraine headaches

## 2019-10-31 NOTE — Progress Notes (Signed)
10/31/2019   Research - [facility] ECOG-ACRIN [id number] Maintenance Cycle 63 - delayed Patient in to clinic today for interim evaluation by Dr. Alvy Bimler. Based on review of patient's condition including evaluation in ED last week, it was determined that patient could continue treatment with Revlimid. See MD note for details.  Patient will resume Revlimid tomorrow 11/01/2019 with a one week delay in treatment start. Accordingly, visits scheduled for December will be rescheduled to account for the one week delay. Patient and MD are in agreement with this plan. Patient was notified of details via phone call today.   Patient will be mailed an updated printed appointment calendar with Cycle 63 treatment days marked, to document doses for thistreatment cycle. In the meantime, patient will record the dates and times of Revlimid dosing for transcription on her updated calendar.  [name] BSN, RN, [place] 10/31/2019 12:37 PM

## 2019-10-31 NOTE — Research (Signed)
Visit information and Progress note header incorrectly noted the 09/27/2019 visit as Maintenance Cycle 61. The correct cycle number is 62. [name] BSN, RN, [place] 10/31/2019 12:15 PM

## 2019-10-31 NOTE — Assessment & Plan Note (Signed)
This is likely due to recent treatment. The patient denies recent history of fevers, cough, chills, diarrhea or dysuria. She is asymptomatic from the leukopenia. I will observe for now.  I will continue the chemotherapy at current dose without dosage adjustment.  If the leukopenia gets progressive worse in the future, I might have to delay her treatment or adjust the chemotherapy dose per protocol

## 2019-10-31 NOTE — Progress Notes (Signed)
[place] OFFICE PROGRESS NOTE  Patient Care Team: Wenda Low, MD as PCP - General (Internal Medicine) Heath Lark, MD as Consulting Physician (Hematology and Oncology) Benson Norway, RN as Registered Nurse (Oncology)  ASSESSMENT & PLAN:  Multiple myeloma in remission [facility]) After extensive review, I do not believe her recent symptoms are due to stroke The patient is comfortable to remain on Revlimid indefinitely. She will continue treatment per research protocol I will see her back as scheduled The research nurse is informed Her recent blood work and 24-hour urine collection and skeletal survey suggested complete remission  Intractable chronic migraine without aura She had recent severe migraine headaches prompting urgent evaluation in the ER to rule out stroke She has no residual symptoms today She has appointment to visit with her neurologist in 6 weeks I recommend her to discuss with her neurologist about intervention to reduce the frequency and severity of her migraine headaches   Drug-induced pancytopenia ([place]) This is likely due to recent treatment. The patient denies recent history of fevers, cough, chills, diarrhea or dysuria. She is asymptomatic from the leukopenia. I will observe for now.  I will continue the chemotherapy at current dose without dosage adjustment.  If the leukopenia gets progressive worse in the future, I might have to delay her treatment or adjust the chemotherapy dose per protocol    No orders of the defined types were placed in this encounter.   INTERVAL HISTORY: Please see below for problem oriented charting. She is seen for evaluation after recent presentation with severe headache and paresthesia She was recommended to go to the emergency room for evaluation to rule out stroke Today, she has no headaches and no neurological deficit She described chronic migraine headaches that comes and goes and she had 2 severe episodes this  month She has appointment to see her neurologist soon No recent infection, fever or chills  SUMMARY OF ONCOLOGIC HISTORY: Oncology History Overview Note  ISS stage 1 IgG lambda subtype (serum albumin 3.6, Beta2 microglobulin 2.32) Durie Salmon Stage 1   Multiple myeloma in remission ([place])  10/10/2013 Imaging   Skeletal survery was negative   11/09/2013 Bone Marrow Biopsy   BM biopsy confirmed myeloma, 76% involved, IgG lambda subtype   12/06/2013 - 08/29/2014 Chemotherapy   Sh received chemo with revlimid, Velcade, Dexamethasone and Zometa. 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   05/05/2014 Imaging   Imaging study of the neck showed no explanation that could cause right neck pain. She is noted to have incidental left upper lung nodule. Plan to repeat imaging study in 3 months.   09/06/2014 Imaging   Bone survey showed no evidence of fracture   09/14/2014 Bone Marrow Biopsy   Bone marrow biopsy showed 8% residual plasma cells by manual count but none on the biopsy specimen   09/26/2014 -  Chemotherapy   She is started on cycle 1 of maintenance Revlimid   09/26/2014 -  Chemotherapy   The patient had [No matching medication found in this treatment plan]  for chemotherapy treatment.    01/31/2015 Imaging    chest x-ray showed pneumonia. Treatment was placed on hold.   05/03/2015 Bone Marrow Biopsy   Accession: [id number] repeat bone marrow aspirate and biopsy show 5% residual plasma cells   10/14/2016 Bone Marrow  Biopsy   Bone marrow biopsy showed the plasma cells represent 4% of all cells with lack of large aggregates or sheets. To assess the plasma cell clonality, immunohistochemical stains is performed and it lack clonality. Normal cytogenetics and FISH   01/09/2017 Imaging   CT chest showed  ground-glass 1.5 cm apical left upper lobe pulmonary nodule. Initial follow-up with CT at 6-12 months is recommended to confirm persistence. If persistent, repeat CT is recommended every 2 years until 5 years of stability has been established. This recommendation follows the consensus statement: Guidelines for Management of Incidental Pulmonary Nodules Detected on CT Images: From the Fleischner Society 2017; Radiology 2017; 284:228-243. 2. Mild patchy ground-glass opacities in the right upper lobe, probably inflammatory, which can also be reassessed on follow-up chest CT performed for the above dominant ground-glass nodule. 3. Solitary 3 mm right lower lobe solid pulmonary nodule, which can also be reassessed on follow-up chest CT performed for the above dominant ground-glass nodule. 4. No thoracic adenopathy. 5. Aortic atherosclerosis. Two-vessel coronary atherosclerosis.   06/30/2017 Imaging   1. No interval change in the 11 x 13 mm ground-glass nodule left upper lobe. Given the nearly 6 months of imaging stability, repeat CT is recommended every 2 years until 5 years of stability has been established. This recommendation follows the consensus statement: Guidelines for Management of Incidental Pulmonary Nodules Detected on CT Images: From the Fleischner Society 2017; Radiology 2017; 284:228-243. 2. Interval resolution of the tiny patchy ground-glass nodules right upper lobe, likely infectious/inflammatory etiology. 3. Stable 3 mm right lower lobe pulmonary nodule. Attention on follow-up recommended. 4. Aortic Atherosclerois (ICD10-170.0)   09/29/2017 Imaging   Skeletal survey 1. Questionable new tiny lucency noted the posterior portion of C3 vertebral body. This may represent a small lytic lesion.  2. No definite thoracic lesion noted on today's exam. Stable lucencies in the left ilium and acetabulum.  3. No other focal abnormalities identified. The left hip is unremarkable .     REVIEW OF  SYSTEMS:   Constitutional: Denies fevers, chills or abnormal weight loss Eyes: Denies blurriness of vision Ears, nose, mouth, throat, and face: Denies mucositis or sore throat Respiratory: Denies cough, dyspnea or wheezes Cardiovascular: Denies palpitation, chest discomfort or lower extremity swelling Gastrointestinal:  Denies nausea, heartburn or change in bowel habits Skin: Denies abnormal skin rashes Lymphatics: Denies new lymphadenopathy or easy bruising Neurological:Denies numbness, tingling or new weaknesses Behavioral/Psych: Mood is stable, no new changes  All other systems were reviewed with the patient and are negative.  I have reviewed the past medical history, past surgical history, social history and family history with the patient and they are unchanged from previous note.  ALLERGIES:  has No Known Allergies.  MEDICATIONS:  Current Outpatient Medications  Medication Sig Dispense Refill  . aspirin 325 MG tablet Take 325 mg by mouth daily.     Marland Kitchen CANNABIDIOL PO Take 500 mg by mouth as needed.    . Cholecalciferol (VITAMIN D3) 2000 UNITS TABS Take 2,000 Units by mouth daily.    . hydrochlorothiazide (HYDRODIURIL) 25 MG tablet Take 25 mg by mouth every evening.     Marland Kitchen lenalidomide (REVLIMID) 5 MG capsule TAKE 1 CAPSULE BY MOUTH  EVERY OTHER DAY FOR 21 DAYS ON THEN 7 DAYS OFF 11 capsule 0  . levothyroxine (SYNTHROID, LEVOTHROID) 75 MCG tablet Take 75 mcg by mouth daily before breakfast.    . methocarbamol (ROBAXIN) 500 MG tablet Take 1 tablet (500 mg total) by mouth every 8 (  eight) hours as needed for muscle spasms. 90 tablet 1  . Multiple Vitamins-Minerals (CENTRUM SILVER PO) Take 1 tablet by mouth daily.     Marland Kitchen oxyCODONE (ROXICODONE) 15 MG immediate release tablet Take 1 tablet (15 mg total) by mouth 3 (three) times daily as needed for pain. 90 tablet 0  . polyethylene glycol (MIRALAX / GLYCOLAX) packet Take 17 g by mouth daily as needed for mild constipation.     . RESTASIS 0.05  % ophthalmic emulsion PLACE 1 DROP IN BOTH EYES IN THE EVENING  4  . sitaGLIPtin (JANUVIA) 50 MG tablet Take 50 mg by mouth daily.    . traZODone (DESYREL) 50 MG tablet TAKE 1 TABLET (50 MG TOTAL) BY MOUTH AT BEDTIME. 90 tablet 3  . venlafaxine XR (EFFEXOR-XR) 150 MG 24 hr capsule Take 1 capsule (150 mg total) by mouth daily with breakfast. 90 capsule 1  . verapamil (VERELAN PM) 120 MG 24 hr capsule TAKE 1 CAPSULE BY MOUTH EVERY DAY 90 capsule 2   No current facility-administered medications for this visit.    Facility-Administered Medications Ordered in Other Visits  Medication Dose Route Frequency Provider Last Rate Last Dose  . gadopentetate dimeglumine (MAGNEVIST) injection 15 mL  15 mL Intravenous Once PRN Melvenia Beam, MD        PHYSICAL EXAMINATION: ECOG PERFORMANCE STATUS: 1 - Symptomatic but completely ambulatory  Vitals:   10/31/19 1123  BP: 127/70  Pulse: 80  Resp: 19  Temp: 98.1 F (36.7 C)  SpO2: 100%   Filed Weights   10/31/19 1123  Weight: 169 lb 11.2 oz (77 kg)    GENERAL:alert, no distress and comfortable Musculoskeletal:no cyanosis of digits and no clubbing  NEURO: alert & oriented x 3 with fluent speech, no focal motor/sensory deficits  LABORATORY DATA:  I have reviewed the data as listed    Component Value Date/Time   NA 139 08/23/2019 0927   NA 141 09/29/2017 0830   K 3.5 08/23/2019 0927   K 3.6 09/29/2017 0830   CL 105 08/23/2019 0927   CO2 25 08/23/2019 0927   CO2 29 09/29/2017 0830   GLUCOSE 129 (H) 08/23/2019 0927   GLUCOSE 87 09/29/2017 0830   BUN 14 08/23/2019 0927   BUN 14.0 09/29/2017 0830   CREATININE 0.92 08/23/2019 0927   CREATININE 0.9 09/29/2017 0830   CALCIUM 9.0 08/23/2019 0927   CALCIUM 9.6 09/29/2017 0830   PROT 7.4 08/23/2019 0927   PROT 7.3 09/29/2017 0832   PROT 8.0 09/29/2017 0830   ALBUMIN 3.8 08/23/2019 0927   ALBUMIN 3.8 09/29/2017 0830   AST 19 08/23/2019 0927   AST 21 09/29/2017 0830   ALT 23 08/23/2019  0927   ALT 69 (H) 09/29/2017 0830   ALKPHOS 51 08/23/2019 0927   ALKPHOS 60 09/29/2017 0830   BILITOT 0.6 08/23/2019 0927   BILITOT 0.46 09/29/2017 0830   GFRNONAA >60 08/23/2019 0927   GFRAA >60 08/23/2019 0927    No results found for: SPEP, UPEP  Lab Results  Component Value Date   WBC 3.7 (L) 10/25/2019   NEUTROABS 1.3 (L) 10/25/2019   HGB 11.6 (L) 10/25/2019   HCT 37.7 10/25/2019   MCV 74.4 (L) 10/25/2019   PLT 191 10/25/2019      Chemistry      Component Value Date/Time   NA 139 08/23/2019 0927   NA 141 09/29/2017 0830   K 3.5 08/23/2019 0927   K 3.6 09/29/2017 0830   CL 105 08/23/2019  0927   CO2 25 08/23/2019 0927   CO2 29 09/29/2017 0830   BUN 14 08/23/2019 0927   BUN 14.0 09/29/2017 0830   CREATININE 0.92 08/23/2019 0927   CREATININE 0.9 09/29/2017 0830      Component Value Date/Time   CALCIUM 9.0 08/23/2019 0927   CALCIUM 9.6 09/29/2017 0830   ALKPHOS 51 08/23/2019 0927   ALKPHOS 60 09/29/2017 0830   AST 19 08/23/2019 0927   AST 21 09/29/2017 0830   ALT 23 08/23/2019 0927   ALT 69 (H) 09/29/2017 0830   BILITOT 0.6 08/23/2019 0927   BILITOT 0.46 09/29/2017 0830       RADIOGRAPHIC STUDIES: I have personally reviewed the radiological images as listed and agreed with the findings in the report. Ct Head Wo Contrast  Result Date: 10/25/2019 CLINICAL DATA:  63 year old female with recurrent headache. Undergoing oral chemotherapy for multiple myeloma. EXAM: CT HEAD WITHOUT CONTRAST TECHNIQUE: Contiguous axial images were obtained from the base of the skull through the vertex without intravenous contrast. COMPARISON:  Brain MRI 04/02/2016. Head CT 07/21/2007. FINDINGS: Brain: Cerebral volume has not significantly changed since 2008 and remains normal for age. No midline shift, ventriculomegaly, mass effect, evidence of mass lesion, intracranial hemorrhage or evidence of cortically based acute infarction. Gray-white matter differentiation is within normal  limits throughout the brain. Vascular: Mild Calcified atherosclerosis at the skull base. No suspicious intracranial vascular hyperdensity. Skull: Stable since 2008 and within normal limits. Sinuses/Orbits: Visualized paranasal sinuses and mastoids are stable and well pneumatized. Other: Visualized orbits and scalp soft tissues are within normal limits. IMPRESSION: Stable and normal for age non contrast CT appearance of the brain. Electronically Signed   By: Genevie Ann M.D.   On: 10/25/2019 17:28    All questions were answered. The patient knows to call the clinic with any problems, questions or concerns. No barriers to learning was detected.  I spent 15 minutes counseling the patient face to face. The total time spent in the appointment was 20 minutes and more than 50% was on counseling and review of test results  Heath Lark, MD 10/31/2019 11:59 AM

## 2019-10-31 NOTE — Telephone Encounter (Signed)
Per 11/30 schedule message moved 12/15 and 12/18 appointments to 12/22 and 12/29. Confirmed with patient.

## 2019-11-01 ENCOUNTER — Encounter: Payer: Self-pay | Admitting: Hematology and Oncology

## 2019-11-01 MED ORDER — LENALIDOMIDE 5 MG PO CAPS CTSU E1A11: ORAL_CAPSULE | ORAL | 0 refills | Status: DC

## 2019-11-07 ENCOUNTER — Ambulatory Visit: Payer: Medicare Other | Admitting: Family Medicine

## 2019-11-11 ENCOUNTER — Other Ambulatory Visit: Payer: Self-pay | Admitting: Hematology and Oncology

## 2019-11-11 DIAGNOSIS — C9001 Multiple myeloma in remission: Secondary | ICD-10-CM

## 2019-11-11 NOTE — Telephone Encounter (Signed)
Pls refill electronically °

## 2019-11-15 ENCOUNTER — Other Ambulatory Visit: Payer: Medicare Other

## 2019-11-18 ENCOUNTER — Ambulatory Visit: Payer: Medicare Other | Admitting: Hematology and Oncology

## 2019-11-18 ENCOUNTER — Other Ambulatory Visit: Payer: Self-pay | Admitting: *Deleted

## 2019-11-18 ENCOUNTER — Encounter: Payer: Medicare Other | Admitting: *Deleted

## 2019-11-21 ENCOUNTER — Ambulatory Visit: Payer: Medicare Other | Admitting: Adult Health

## 2019-11-22 ENCOUNTER — Other Ambulatory Visit: Payer: Self-pay

## 2019-11-22 ENCOUNTER — Inpatient Hospital Stay: Payer: Medicare Other | Attending: Hematology and Oncology

## 2019-11-22 DIAGNOSIS — Z9221 Personal history of antineoplastic chemotherapy: Secondary | ICD-10-CM | POA: Insufficient documentation

## 2019-11-22 DIAGNOSIS — C9001 Multiple myeloma in remission: Secondary | ICD-10-CM | POA: Diagnosis not present

## 2019-11-22 DIAGNOSIS — D61811 Other drug-induced pancytopenia: Secondary | ICD-10-CM | POA: Diagnosis not present

## 2019-11-22 DIAGNOSIS — Z006 Encounter for examination for normal comparison and control in clinical research program: Secondary | ICD-10-CM | POA: Insufficient documentation

## 2019-11-22 DIAGNOSIS — Z79899 Other long term (current) drug therapy: Secondary | ICD-10-CM | POA: Diagnosis not present

## 2019-11-22 LAB — CMP (CANCER CENTER ONLY)
ALT: 25 U/L (ref 0–44)
AST: 22 U/L (ref 15–41)
Albumin: 3.7 g/dL (ref 3.5–5.0)
Alkaline Phosphatase: 49 U/L (ref 38–126)
Anion gap: 9 (ref 5–15)
BUN: 12 mg/dL (ref 8–23)
CO2: 27 mmol/L (ref 22–32)
Calcium: 9.2 mg/dL (ref 8.9–10.3)
Chloride: 104 mmol/L (ref 98–111)
Creatinine: 0.9 mg/dL (ref 0.44–1.00)
GFR, Est AFR Am: >60 mL/min (ref >60)
GFR, Estimated: >60 mL/min (ref >60)
Glucose, Bld: 80 mg/dL (ref 70–99)
Potassium: 3.9 mmol/L (ref 3.5–5.1)
Sodium: 140 mmol/L (ref 135–145)
Total Bilirubin: 0.8 mg/dL (ref 0.3–1.2)
Total Protein: 7.5 g/dL (ref 6.5–8.1)

## 2019-11-22 LAB — LACTATE DEHYDROGENASE: LDH: 161 U/L (ref 98–192)

## 2019-11-22 LAB — CBC WITH DIFFERENTIAL (CANCER CENTER ONLY)
Abs Immature Granulocytes: 0.01 10*3/uL (ref 0.00–0.07)
Basophils Absolute: 0 10*3/uL (ref 0.0–0.1)
Basophils Relative: 1 %
Eosinophils Absolute: 0.1 10*3/uL (ref 0.0–0.5)
Eosinophils Relative: 3 %
HCT: 37.9 % (ref 36.0–46.0)
Hemoglobin: 11.8 g/dL — ABNORMAL LOW (ref 12.0–15.0)
Immature Granulocytes: 0 %
Lymphocytes Relative: 49 %
Lymphs Abs: 1.6 10*3/uL (ref 0.7–4.0)
MCH: 23 pg — ABNORMAL LOW (ref 26.0–34.0)
MCHC: 31.1 g/dL (ref 30.0–36.0)
MCV: 74 fL — ABNORMAL LOW (ref 80.0–100.0)
Monocytes Absolute: 0.4 10*3/uL (ref 0.1–1.0)
Monocytes Relative: 12 %
Neutro Abs: 1.2 10*3/uL — ABNORMAL LOW (ref 1.7–7.7)
Neutrophils Relative %: 35 %
Platelet Count: 183 10*3/uL (ref 150–400)
RBC: 5.12 MIL/uL — ABNORMAL HIGH (ref 3.87–5.11)
RDW: 14.9 % (ref 11.5–15.5)
WBC Count: 3.4 10*3/uL — ABNORMAL LOW (ref 4.0–10.5)
nRBC: 0 % (ref 0.0–0.2)

## 2019-11-22 LAB — TSH: TSH: 1.9 u[IU]/mL (ref 0.308–3.960)

## 2019-11-23 LAB — MULTIPLE MYELOMA PANEL, SERUM
Albumin SerPl Elph-Mcnc: 3.7 g/dL (ref 2.9–4.4)
Albumin/Glob SerPl: 1.1 (ref 0.7–1.7)
Alpha 1: 0.2 g/dL (ref 0.0–0.4)
Alpha2 Glob SerPl Elph-Mcnc: 0.6 g/dL (ref 0.4–1.0)
B-Globulin SerPl Elph-Mcnc: 1.1 g/dL (ref 0.7–1.3)
Gamma Glob SerPl Elph-Mcnc: 1.7 g/dL (ref 0.4–1.8)
Globulin, Total: 3.5 g/dL (ref 2.2–3.9)
IgA: 121 mg/dL (ref 87–352)
IgG (Immunoglobin G), Serum: 1821 mg/dL — ABNORMAL HIGH (ref 586–1602)
IgM (Immunoglobulin M), Srm: 63 mg/dL (ref 26–217)
Total Protein ELP: 7.2 g/dL (ref 6.0–8.5)

## 2019-11-23 LAB — KAPPA/LAMBDA LIGHT CHAINS
Kappa free light chain: 24.1 mg/L — ABNORMAL HIGH (ref 3.3–19.4)
Kappa, lambda light chain ratio: 1.22 (ref 0.26–1.65)
Lambda free light chains: 19.7 mg/L (ref 5.7–26.3)

## 2019-11-29 ENCOUNTER — Other Ambulatory Visit: Payer: Self-pay

## 2019-11-29 ENCOUNTER — Encounter: Payer: Self-pay | Admitting: Hematology and Oncology

## 2019-11-29 ENCOUNTER — Telehealth: Payer: Self-pay | Admitting: Hematology and Oncology

## 2019-11-29 ENCOUNTER — Inpatient Hospital Stay: Payer: Medicare Other | Admitting: *Deleted

## 2019-11-29 ENCOUNTER — Inpatient Hospital Stay (HOSPITAL_BASED_OUTPATIENT_CLINIC_OR_DEPARTMENT_OTHER): Payer: Medicare Other | Admitting: Hematology and Oncology

## 2019-11-29 DIAGNOSIS — G43719 Chronic migraine without aura, intractable, without status migrainosus: Secondary | ICD-10-CM

## 2019-11-29 DIAGNOSIS — C9001 Multiple myeloma in remission: Secondary | ICD-10-CM

## 2019-11-29 DIAGNOSIS — D61811 Other drug-induced pancytopenia: Secondary | ICD-10-CM

## 2019-11-29 DIAGNOSIS — Z006 Encounter for examination for normal comparison and control in clinical research program: Secondary | ICD-10-CM | POA: Diagnosis not present

## 2019-11-29 MED ORDER — LENALIDOMIDE 5 MG PO CAPS CTSU E1A11: ORAL_CAPSULE | ORAL | 0 refills | Status: DC

## 2019-11-29 NOTE — Telephone Encounter (Signed)
Scheduled appt per 12/29 sch message - unable to reach pt, message sent to RN to try to reach out to patient.

## 2019-11-29 NOTE — Assessment & Plan Note (Signed)
She denies further migraine headaches The patient is no longer taking any pain medicine Continue conservative approach She has neurologist to follow if needed

## 2019-11-29 NOTE — Research (Signed)
11/29/2019 Research - [facility] ECOG-ACRIN [id number] Maintenance Cycle 64 Patient in to clinic today unaccompanied for evaluation prior to beginning maintenance treatment cycle 64.   Labs w/myeloma panel - Obtained on 11/22/2019 within 7-day window per protocol, to allow adequate time for results prior to treatment. Results reviewed by Dr. Alvy Bimler and found to be within parameters for continued dosing at Maintenance phase Dose Level -3, Revlimid 5 mg every other day for 21 days, every 28 days. Review of myeloma panel revealed evidence of Complete Response. Per MD, bone marrow biopsy and aspirate would not be considered standard of care at this time.   See MD note for history and physical exam. Patient reports side effects over the past month including her usual complaints of back and bone pain, ongoing fatigue, insomnia, and peripheral sensory neuropathy. See AE table for details. She is fairly active around the house and continues to exercise regularly. She notes that she has been taking no pain pills, other than tramadol for migraine headaches, though her symptoms of pain have not changed significantly. She reported one day with grade 1 diarrhea (max 4 loose stools/24 hours).  Based on lab results review and history and physical by Dr. Alvy Bimler, patient meets criteria for continued treatment with Revlimid, which she is in agreement to do. Patient is aware that treatment will continue indefinitely, unless there is evidence of disease recurrence, adverse effects requiring treatment discontinuation, or patient/physician decision to discontinue therapy.  Patient returned completed cycle 63 Medication Calendar, confirming dosing at dose level -3, Revlimid 23m every other day, Days 1-21. Patient given printed appointment calendar with Cycle 64 treatment days marked, to document doses for the next treatment cycle, which will begin today. Patient is aware that she should contact the office at any time for any symptoms or  problems of concern.  Patient will continue with monthly clinic visits on 12/27/2019 and 01/24/2020 for hematology and AE assessments prior to beginning subsequent treatment cycles. Myeloma panel and other labs to be performed on 02/14/2020 in advance of the next MD clinic visit at the end of Cycle 66, expected to occur on 02/21/2020.  Cindy S. SBrigitte PulseBSN, RN, CManata12/29/2020 12:46 PM  Adverse Event Log Study/Protocol: [facility] ECOG [id number] Maintenance Cycles 61-63: 08/30/2019 - 11/29/2019 (end of Cycle 63 = [id number] Solicited &/or Reportable Events Grade Attribution to lenalidomide Cycle # Comments  Anemia Grade 1 Definite 61-63    Hyperglycemia Grade 0 -    Lymphocyte count decreased Grade 0 -    Neutrophil count decreased Grade 2  Definite 61-63   Platelet count decreased Grade 0 -     Diarrhea Grade 1 Probable 61, 63   Dyspnea Grade 0 -    Edema: limbs Grade 0 -    Fatigue  Grade 2  Probable 61-63 Moderate, occasionally limiting ADLs (unchanged from previous)  Fever Grade 0 -    Insomnia  Grade 2   Unlikely 61-63 Moderate difficulty; occ. use of sleeping pill  Irritability Grade 0 -    Muscle weakness trunk Grade 0 -    Nausea Grade 0 -    Peripheral motor neuropathy Grade 0     Peripheral sensory neuropathy  Grade 2 Unlikely 61-63 Moderate symptoms, unchanged from previous  Rash acneiform Grade 0 -    Vomiting Grade 0 -    Non-reportable AEs (unsolicited, < Grade 3): - Leukopenia (grade 1) - Moderate pain (grade 2) o Backache o Bone pain o Knee pain o Hip pain o Headache/migraine -  Left-sided numbness (grade 2) - Weight loss (grade 1) - Depression - mild x 1 day (grade 1) - Sore throat - mild x 1 day C-63, D-5 (grade 1) [name] BSN, RN, CCRP 11/30/2019 4:13 PM

## 2019-11-29 NOTE — Progress Notes (Signed)
[place] OFFICE PROGRESS NOTE  Patient Care Team: Wenda Low, MD as PCP - General (Internal Medicine) Heath Lark, MD as Consulting Physician (Hematology and Oncology) Benson Norway, RN as Registered Nurse (Oncology)  ASSESSMENT & PLAN:  Multiple myeloma in remission [facility]) Her recent myeloma panel showed that she is in remission The patient is comfortable to remain on Revlimid indefinitely. She will continue treatment per research protocol I will see her back as scheduled The research nurse is informed She will continue calcium with vitamin D supplement and aspirin for DVT prophylaxis  Drug-induced pancytopenia ([place]) This is likely due to recent treatment. The patient denies recent history of fevers, cough, chills, diarrhea or dysuria. She is asymptomatic from the leukopenia. I will observe for now.  I will continue the chemotherapy at current dose without dosage adjustment.  If the leukopenia gets progressive worse in the future, I might have to delay her treatment or adjust the chemotherapy dose per protocol   Intractable chronic migraine without aura She denies further migraine headaches The patient is no longer taking any pain medicine Continue conservative approach She has neurologist to follow if needed   No orders of the defined types were placed in this encounter.   INTERVAL HISTORY: Please see below for problem oriented charting. She returns for further follow-up She is doing very well No recent infection, fever or chills Denies recurrence of neurological deficit or severe migraine headaches She denies bone pain.  In fact, she have stopped taking oxycodone altogether  SUMMARY OF ONCOLOGIC HISTORY: Oncology History Overview Note  ISS stage 1 IgG lambda subtype (serum albumin 3.6, Beta2 microglobulin 2.32) Durie Salmon Stage 1   Multiple myeloma in remission ([place])  10/10/2013 Imaging   Skeletal survery was negative   11/09/2013 Bone Marrow  Biopsy   BM biopsy confirmed myeloma, 76% involved, IgG lambda subtype   12/06/2013 - 08/29/2014 Chemotherapy   Sh received chemo with revlimid, Velcade, Dexamethasone and Zometa. 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   05/05/2014 Imaging   Imaging study of the neck showed no explanation that could cause right neck pain. She is noted to have incidental left upper lung nodule. Plan to repeat imaging study in 3 months.   09/06/2014 Imaging   Bone survey showed no evidence of fracture   09/14/2014 Bone Marrow Biopsy   Bone marrow biopsy showed 8% residual plasma cells by manual count but none on the biopsy specimen   09/26/2014 -  Chemotherapy   She is started on cycle 1 of maintenance Revlimid   09/26/2014 -  Chemotherapy   The patient had [No matching medication found in this treatment plan]  for chemotherapy treatment.    01/31/2015 Imaging    chest x-ray showed pneumonia. Treatment was placed on hold.   05/03/2015 Bone Marrow Biopsy   Accession: [id number] repeat bone marrow aspirate and biopsy show 5% residual plasma cells   10/14/2016 Bone Marrow Biopsy   Bone marrow biopsy showed the plasma cells represent 4% of all cells with lack of large aggregates or sheets. To assess the plasma cell clonality, immunohistochemical stains is performed and it lack clonality. Normal cytogenetics and FISH   01/09/2017 Imaging   CT chest showed ground-glass 1.5 cm apical left upper lobe pulmonary nodule. Initial follow-up with CT at 6-12 months  is recommended to confirm persistence. If persistent, repeat CT is recommended every 2 years until 5 years of stability has been established. This recommendation follows the consensus statement: Guidelines for Management of Incidental Pulmonary Nodules Detected on CT Images: From the  Fleischner Society 2017; Radiology 2017; 284:228-243. 2. Mild patchy ground-glass opacities in the right upper lobe, probably inflammatory, which can also be reassessed on follow-up chest CT performed for the above dominant ground-glass nodule. 3. Solitary 3 mm right lower lobe solid pulmonary nodule, which can also be reassessed on follow-up chest CT performed for the above dominant ground-glass nodule. 4. No thoracic adenopathy. 5. Aortic atherosclerosis. Two-vessel coronary atherosclerosis.   06/30/2017 Imaging   1. No interval change in the 11 x 13 mm ground-glass nodule left upper lobe. Given the nearly 6 months of imaging stability, repeat CT is recommended every 2 years until 5 years of stability has been established. This recommendation follows the consensus statement: Guidelines for Management of Incidental Pulmonary Nodules Detected on CT Images: From the Fleischner Society 2017; Radiology 2017; 284:228-243. 2. Interval resolution of the tiny patchy ground-glass nodules right upper lobe, likely infectious/inflammatory etiology. 3. Stable 3 mm right lower lobe pulmonary nodule. Attention on follow-up recommended. 4. Aortic Atherosclerois (ICD10-170.0)   09/29/2017 Imaging   Skeletal survey 1. Questionable new tiny lucency noted the posterior portion of C3 vertebral body. This may represent a small lytic lesion.  2. No definite thoracic lesion noted on today's exam. Stable lucencies in the left ilium and acetabulum.  3. No other focal abnormalities identified. The left hip is unremarkable .     REVIEW OF SYSTEMS:   Constitutional: Denies fevers, chills or abnormal weight loss Eyes: Denies blurriness of vision Ears, nose, mouth, throat, and face: Denies mucositis or sore throat Respiratory: Denies cough, dyspnea or wheezes Cardiovascular: Denies palpitation, chest discomfort or lower extremity swelling Gastrointestinal:  Denies nausea, heartburn or change in bowel habits Skin:  Denies abnormal skin rashes Lymphatics: Denies new lymphadenopathy or easy bruising Neurological:Denies numbness, tingling or new weaknesses Behavioral/Psych: Mood is stable, no new changes  All other systems were reviewed with the patient and are negative.  I have reviewed the past medical history, past surgical history, social history and family history with the patient and they are unchanged from previous note.  ALLERGIES:  has No Known Allergies.  MEDICATIONS:  Current Outpatient Medications  Medication Sig Dispense Refill  . aspirin 325 MG tablet Take 325 mg by mouth daily.     Marland Kitchen CANNABIDIOL PO Take 500 mg by mouth as needed.    . Cholecalciferol (VITAMIN D3) 2000 UNITS TABS Take 2,000 Units by mouth daily.    . hydrochlorothiazide (HYDRODIURIL) 25 MG tablet Take 25 mg by mouth every evening.     Marland Kitchen lenalidomide (REVLIMID) 5 MG capsule Take 1 capsule every other day for 21 days, off 7 days. Repeat every 28 days. 11 capsule 0  . levothyroxine (SYNTHROID, LEVOTHROID) 75 MCG tablet Take 75 mcg by mouth daily before breakfast.    . methocarbamol (ROBAXIN) 500 MG tablet Take 1 tablet (500 mg total) by mouth every 8 (eight) hours as needed for muscle spasms. 90 tablet 1  . Multiple Vitamins-Minerals (CENTRUM SILVER PO) Take 1 tablet by mouth daily.     . polyethylene glycol (MIRALAX / GLYCOLAX) packet Take 17 g by mouth daily as needed for mild constipation.     . RESTASIS 0.05 % ophthalmic emulsion PLACE 1 DROP IN BOTH EYES IN THE EVENING  4  .  sitaGLIPtin (JANUVIA) 50 MG tablet Take 50 mg by mouth daily.    . traZODone (DESYREL) 50 MG tablet TAKE 1 TABLET (50 MG TOTAL) BY MOUTH AT BEDTIME. 90 tablet 3  . venlafaxine XR (EFFEXOR-XR) 150 MG 24 hr capsule Take 1 capsule (150 mg total) by mouth daily with breakfast. 90 capsule 1  . verapamil (VERELAN PM) 120 MG 24 hr capsule TAKE 1 CAPSULE BY MOUTH EVERY DAY 90 capsule 2   No current facility-administered medications for this visit.    Facility-Administered Medications Ordered in Other Visits  Medication Dose Route Frequency Provider Last Rate Last Admin  . gadopentetate dimeglumine (MAGNEVIST) injection 15 mL  15 mL Intravenous Once PRN Melvenia Beam, MD        PHYSICAL EXAMINATION: ECOG PERFORMANCE STATUS: 0 - Asymptomatic  Vitals:   11/29/19 1157  BP: 122/80  Pulse: 66  Resp: 15  Temp: 98.7 F (37.1 C)  SpO2: 100%   Filed Weights   11/29/19 1157  Weight: 168 lb (76.2 kg)    GENERAL:alert, no distress and comfortable SKIN: skin color, texture, turgor are normal, no rashes or significant lesions EYES: normal, Conjunctiva are pink and non-injected, sclera clear OROPHARYNX:no exudate, no erythema and lips, buccal mucosa, and tongue normal  NECK: supple, thyroid normal size, non-tender, without nodularity LYMPH:  no palpable lymphadenopathy in the cervical, axillary or inguinal LUNGS: clear to auscultation and percussion with normal breathing effort HEART: regular rate & rhythm and no murmurs and no lower extremity edema ABDOMEN:abdomen soft, non-tender and normal bowel sounds Musculoskeletal:no cyanosis of digits and no clubbing  NEURO: alert & oriented x 3 with fluent speech, no focal motor/sensory deficits  LABORATORY DATA:  I have reviewed the data as listed    Component Value Date/Time   NA 140 11/22/2019 0722   NA 141 09/29/2017 0830   K 3.9 11/22/2019 0722   K 3.6 09/29/2017 0830   CL 104 11/22/2019 0722   CO2 27 11/22/2019 0722   CO2 29 09/29/2017 0830   GLUCOSE 80 11/22/2019 0722   GLUCOSE 87 09/29/2017 0830   BUN 12 11/22/2019 0722   BUN 14.0 09/29/2017 0830   CREATININE 0.90 11/22/2019 0722   CREATININE 0.9 09/29/2017 0830   CALCIUM 9.2 11/22/2019 0722   CALCIUM 9.6 09/29/2017 0830   PROT 7.5 11/22/2019 0722   PROT 7.3 09/29/2017 0832   PROT 8.0 09/29/2017 0830   ALBUMIN 3.7 11/22/2019 0722   ALBUMIN 3.8 09/29/2017 0830   AST 22 11/22/2019 0722   AST 21 09/29/2017 0830    ALT 25 11/22/2019 0722   ALT 69 (H) 09/29/2017 0830   ALKPHOS 49 11/22/2019 0722   ALKPHOS 60 09/29/2017 0830   BILITOT 0.8 11/22/2019 0722   BILITOT 0.46 09/29/2017 0830   GFRNONAA >60 11/22/2019 0722   GFRAA >60 11/22/2019 0722    No results found for: SPEP, UPEP  Lab Results  Component Value Date   WBC 3.4 (L) 11/22/2019   NEUTROABS 1.2 (L) 11/22/2019   HGB 11.8 (L) 11/22/2019   HCT 37.9 11/22/2019   MCV 74.0 (L) 11/22/2019   PLT 183 11/22/2019      Chemistry      Component Value Date/Time   NA 140 11/22/2019 0722   NA 141 09/29/2017 0830   K 3.9 11/22/2019 0722   K 3.6 09/29/2017 0830   CL 104 11/22/2019 0722   CO2 27 11/22/2019 0722   CO2 29 09/29/2017 0830   BUN 12 11/22/2019 0722  BUN 14.0 09/29/2017 0830   CREATININE 0.90 11/22/2019 0722   CREATININE 0.9 09/29/2017 0830      Component Value Date/Time   CALCIUM 9.2 11/22/2019 0722   CALCIUM 9.6 09/29/2017 0830   ALKPHOS 49 11/22/2019 0722   ALKPHOS 60 09/29/2017 0830   AST 22 11/22/2019 0722   AST 21 09/29/2017 0830   ALT 25 11/22/2019 0722   ALT 69 (H) 09/29/2017 0830   BILITOT 0.8 11/22/2019 0722   BILITOT 0.46 09/29/2017 0830       All questions were answered. The patient knows to call the clinic with any problems, questions or concerns. No barriers to learning was detected.  I spent 15 minutes counseling the patient face to face. The total time spent in the appointment was 20 minutes and more than 50% was on counseling and review of test results  Heath Lark, MD 11/29/2019 12:37 PM

## 2019-11-29 NOTE — Assessment & Plan Note (Signed)
This is likely due to recent treatment. The patient denies recent history of fevers, cough, chills, diarrhea or dysuria. She is asymptomatic from the leukopenia. I will observe for now.  I will continue the chemotherapy at current dose without dosage adjustment.  If the leukopenia gets progressive worse in the future, I might have to delay her treatment or adjust the chemotherapy dose per protocol

## 2019-11-29 NOTE — Assessment & Plan Note (Addendum)
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 She will continue treatment per research protocol I will see her back as scheduled The research nurse is informed She will continue calcium with vitamin D supplement and aspirin for DVT prophylaxis

## 2019-12-05 ENCOUNTER — Telehealth: Payer: Self-pay

## 2019-12-05 NOTE — Telephone Encounter (Signed)
Called pt regarding their appt w/ NP. All of the Nps schedules are being switched to MyChart visits due to short staffing and Covid.  Per supervisor NP Sharyn Dross requests.   If pt has difficulty w/ mychart please have them contact [phone]. If pt wants to r/s their appt please r/s pts appt.

## 2019-12-06 ENCOUNTER — Encounter: Payer: Self-pay | Admitting: Family Medicine

## 2019-12-06 ENCOUNTER — Telehealth (INDEPENDENT_AMBULATORY_CARE_PROVIDER_SITE_OTHER): Payer: Medicare Other | Admitting: Family Medicine

## 2019-12-06 DIAGNOSIS — G43709 Chronic migraine without aura, not intractable, without status migrainosus: Secondary | ICD-10-CM

## 2019-12-06 DIAGNOSIS — R202 Paresthesia of skin: Secondary | ICD-10-CM | POA: Diagnosis not present

## 2019-12-06 MED ORDER — VERAPAMIL HCL ER 120 MG PO CP24: 120.0000 mg | ORAL_CAPSULE | Freq: Every day | ORAL | 3 refills | Status: AC

## 2019-12-06 MED ORDER — VENLAFAXINE HCL ER 150 MG PO CP24: 150.0000 mg | ORAL_CAPSULE | Freq: Every day | ORAL | 3 refills | Status: AC

## 2019-12-06 MED ORDER — AJOVY 225 MG/1.5ML ~~LOC~~ SOAJ: 225.0000 mg | SUBCUTANEOUS | 11 refills | Status: DC

## 2019-12-06 NOTE — Progress Notes (Addendum)
PATIENT: Carla Perry DOB: 1956/11/30  REASON FOR VISIT: follow up HISTORY FROM: patient  Virtual Visit via Telephone Note  I connected with Shelbie Ammons on 12/06/19 at 11:30 AM EST by telephone and verified that I am speaking with the correct person using two identifiers.   I discussed the limitations, risks, security and privacy concerns of performing an evaluation and management service by telephone and the availability of in person appointments. I also discussed with the patient that there may be a patient responsible charge related to this service. The patient expressed understanding and agreed to proceed.   History of Present Illness:  12/06/19 Carla Perry is a 64 y.o. female here today for follow up for migraines and left sided paresthesias. She continues venlafaxine 184m and verapamil 1243mdaily. She reports increased frequency of migraines over the past year. She has a daily tension type headache. She notes that migraines occur 1-3 times per month.  She had a migraine in November "that was similar to a stroke." She had numbness of the left side of her body. She had left sided headache described as a pressure sensation. She denies weakness, confusion or speech changes. She took two tramadol tablets which helped to ease pain. Symptoms lasted about 5-6 hours and gradually eased off. She has a history of complicated migraines with similar symptoms. She was seen by oncology who sent her to the ER for evaluation. She had a CT of head that was normal. She continues as a participant in research trial for multiple myeloma. She is currently taking Revlimid daily.   [facility]  Carla Perry a [age]ear old female with a history of migraine headaches and paresthesias on the left side.  She returns today for follow-up.  She states that she has not had a true headache since October.  When she does get a headache it does occur on the  left side of the head she typically can take tramadol and the headache resolved quickly.  She states that she has ongoing numbness on the left side of the body.  This is been present since she saw Carla Perry The patient's work-up has been relatively unremarkable.  He denies any changes in her vision, speech denies any weakness.  She returns today for evaluation.  HISTORY( Copied from Carla Perry)  Interval history 06/25/2017: She has constant pressure on the left side of the head, she has 6/10 daily headache, migraines are on the left side, can last days with light and sound sensitivity, nausea, no vomiting, pounding pain severe, no aura, no medication overuse. She takes 2 tramadol at the onset of migraine. Dr. LoErling Cruzad put her on Verapamil and Effexor. She does feel that the Verapamil helps, if she doesn't take it she get a migraine.  Interval history 06/25/2016: She gets 3-4 migraines a month, they last an hour and go away with the tramadol. She still have low back pain and radiation into the legs. She has numbness and weakness in the legs intermittently but MRI of the neural axis was negative. Kneeling thing we didn't do was imaging MRI of the lumbar spine. Considering that she continues to have numbness and weakness in the legs will image MRI of the lumbar spine.  Interval update 03/18/2016: This is a lovely [age]ear old female with a very complicated past medical history including hypertension, thyroid disorder, migraine, multiple myeloma s/p chemo currently participating in a research trial, neuropathy, insomnia, bone pain,  back pain, joint pain, anemia, fatigue, leukopenia due to antineoplastic chemotherapy, right leg swelling, DVT, diabetes. I have seen patient in the past for chronic migraines but today she is here for a new problem,paresthesias that started February. Happens when waking up in the morning. Never happens otherwise. She feels an electrical current through her body with  persistent tingling. Starts in the thoracic or in the lumbar spine. Feels like electricity. Starts in the spine. She feels tingling all over, in the face, arms, legs from head to toe. Will last several hours. A lot of times it is associated with a migraine. Nothing makes it better. Stretching aggravates the symptoms. She has pain in the spine associated with the paresthesias. Last night it started with numbness at 2am. She couldn't get back to sleep. She developed a headache. He still has residual headache and Her left eye still fells sore and head feels sore. Also a residual burning feeling. Arms feel like they are burning. Episodes becoming more frequent. 6 episodes in Feb, 8 in march, 13 so far in April. Becoming more frequent and lasting longer. She experiences facial numbness with the episodes, burning and tingling in the spine that spreads to her whole body. The symptoms are usually associated with headache or migraine. Moving around helps with the symptoms. No weakness. No vision changes but does describe eye pain. The headaches are pressure and pain.she still gets the vertigo with the migraines. Headache left side of the head with the vertigo, no light or sound sensitivity. 5-10 minutes after the paresthesias she has the migraines/headaches. No inciting events. No trauma. She has low back pain, situated on the right side, radiates down the legs to the back of the legs, walking is not as good. Right leg goes numb. Paresthesias are more on the left.   Recent labs include normal B12. Normal TSH.  Interval update7/19/2016: This is a former patient of Dr. Janann Colonel who is seen in our office by our nurse practitioner Carla Perry. Patient is establishing care with me today. She is a 64 year old female with a history of chronic migraines. She was last seen in April. Today she returns and she is doing very well. She is on verapamil 120 mg extended release and venlafaxine XR 75 mg. She is doing very well. She  has had maybe 3 migraines within the last few months. And they have been on the left side of the head with vertigo twice. When she has the vertigo, she has to lay in bed and sleep it off. But this is a significant improvement and she is very happy with this maintenance. She endorses nausea, photophobia and phonophobia. She takes tramadol for acute management. Discussed in detail that if she is doing well, we will keep her on her current migraine management. She can continue to follow up with Carla Perry as needed.   Observations/Objective:  Generalized: Well developed, in no acute distress  Mentation: Alert oriented to time, place, history taking. Follows all commands speech and language fluent   Assessment and Plan:  64 y.o. year old female  has a past medical history of Abnormal thyroid function test (08/30/2014), Anemia, Anemia, unspecified (10/06/2013), Bone pain (10/21/2013), Bronchitis (01/31/2015), Diabetes mellitus without complication ([place]) (41/3244), Diverticulitis (07/18/2014), DVT (deep venous thrombosis) ([place]) (02/07/2014), HBP (high blood pressure), Insomnia (02/11/2016), Leukopenia due to antineoplastic chemotherapy ([place]) (12/27/2013), Memory loss, MGUS (monoclonal gammopathy of unknown significance), MGUS (monoclonal gammopathy of unknown significance) (10/06/2013), Migraine, Multiple myeloma, without mention of having achieved remission (11/16/2013), Pancytopenia (  [place]) (12/27/2015), Peripheral neuropathy (12/27/2015), Personal history of chemotherapy, Right leg swelling (02/07/2014), Seizure ([place]) (1960), Thyroid disorder, and Vitamin D deficiency (10/24/2014). here with    ICD-10-CM   1. Chronic migraine without aura without status migrainosus, not intractable  G43.709   2. Paresthesia  R20.2    Mrs. Calles has noted increased frequency and intensity of migrainous headaches over the past few months.  Left-sided paresthesias continue but are stable.  She continues venlafaxine 150 mg and  verapamil 120 mg daily.  We have discussed adding another preventative medication to help with frequency and intensity of migraines.  We have discussed Ajovy in detail.  She is aware of potential side effects, appropriate administration and storage of this medication.  She will call with any concerning side effects or new symptoms.  She will continue to monitor for triggers.  Adequate hydration, well-balanced diet and regular exercise advised.  She will continue close follow-up with primary care and oncology.  She will follow back up in the office with me in 3 months.  She verbalizes understanding and agreement with this plan.   No orders of the defined types were placed in this encounter.   Meds ordered this encounter  Medications  . Fremanezumab-vfrm (AJOVY) 225 MG/1.5ML SOAJ    Sig: Inject 225 mg into the skin every 30 (thirty) days.    Dispense:  1 pen    Refill:  11    Order Specific Question:   Supervising Provider    Answer:   Melvenia Beam [id number]  . venlafaxine XR (EFFEXOR-XR) 150 MG 24 hr capsule    Sig: Take 1 capsule (150 mg total) by mouth daily with breakfast.    Dispense:  90 capsule    Refill:  3    Order Specific Question:   Supervising Provider    Answer:   Melvenia Beam [id number]  . verapamil (VERELAN PM) 120 MG 24 hr capsule    Sig: Take 1 capsule (120 mg total) by mouth daily.    Dispense:  90 capsule    Refill:  3    Order Specific Question:   Supervising Provider    Answer:   Melvenia Beam [id number]     Follow Up Instructions:  I discussed the assessment and treatment plan with the patient. The patient was provided an opportunity to ask questions and all were answered. The patient agreed with the plan and demonstrated an understanding of the instructions.   The patient was advised to call back or seek an in-person evaluation if the symptoms worsen or if the condition fails to improve as anticipated.  I provided 20 minutes of non-face-to-face time  during this encounter.  Patient is located at her place of residence during my chart visit.  Provider is located in the office.   Debbora Presto, NP   Made any corrections needed, and agree with history, physical, neuro exam,assessment and plan as stated.     Sarina Ill, MD [facility]

## 2019-12-07 ENCOUNTER — Other Ambulatory Visit: Payer: Self-pay | Admitting: Family Medicine

## 2019-12-14 ENCOUNTER — Ambulatory Visit
Admission: RE | Admit: 2019-12-14 | Discharge: 2019-12-14 | Disposition: A | Discharge status: Home / Self Care | Payer: Medicare Other | Source: Ambulatory Visit | Attending: Obstetrics & Gynecology | Admitting: Obstetrics & Gynecology

## 2019-12-14 ENCOUNTER — Other Ambulatory Visit: Payer: Self-pay

## 2019-12-14 DIAGNOSIS — Z1231 Encounter for screening mammogram for malignant neoplasm of breast: Secondary | ICD-10-CM

## 2019-12-16 ENCOUNTER — Other Ambulatory Visit: Payer: Self-pay | Admitting: Hematology and Oncology

## 2019-12-16 DIAGNOSIS — C9001 Multiple myeloma in remission: Secondary | ICD-10-CM

## 2019-12-16 NOTE — Telephone Encounter (Signed)
PLs refill electronically

## 2019-12-27 ENCOUNTER — Encounter: Payer: Self-pay | Admitting: *Deleted

## 2019-12-27 ENCOUNTER — Inpatient Hospital Stay: Payer: Medicare Other | Attending: Hematology and Oncology

## 2019-12-27 ENCOUNTER — Other Ambulatory Visit: Payer: Self-pay

## 2019-12-27 DIAGNOSIS — Z006 Encounter for examination for normal comparison and control in clinical research program: Secondary | ICD-10-CM | POA: Diagnosis present

## 2019-12-27 DIAGNOSIS — C9001 Multiple myeloma in remission: Secondary | ICD-10-CM | POA: Diagnosis not present

## 2019-12-27 LAB — CBC WITH DIFFERENTIAL (CANCER CENTER ONLY)
Abs Immature Granulocytes: 0 10*3/uL (ref 0.00–0.07)
Basophils Absolute: 0 10*3/uL (ref 0.0–0.1)
Basophils Relative: 1 %
Eosinophils Absolute: 0.1 10*3/uL (ref 0.0–0.5)
Eosinophils Relative: 2 %
HCT: 38 % (ref 36.0–46.0)
Hemoglobin: 11.9 g/dL — ABNORMAL LOW (ref 12.0–15.0)
Immature Granulocytes: 0 %
Lymphocytes Relative: 51 %
Lymphs Abs: 1.9 10*3/uL (ref 0.7–4.0)
MCH: 23 pg — ABNORMAL LOW (ref 26.0–34.0)
MCHC: 31.3 g/dL (ref 30.0–36.0)
MCV: 73.5 fL — ABNORMAL LOW (ref 80.0–100.0)
Monocytes Absolute: 0.4 10*3/uL (ref 0.1–1.0)
Monocytes Relative: 11 %
Neutro Abs: 1.3 10*3/uL — ABNORMAL LOW (ref 1.7–7.7)
Neutrophils Relative %: 35 %
Platelet Count: 251 10*3/uL (ref 150–400)
RBC: 5.17 MIL/uL — ABNORMAL HIGH (ref 3.87–5.11)
RDW: 14.5 % (ref 11.5–15.5)
WBC Count: 3.7 10*3/uL — ABNORMAL LOW (ref 4.0–10.5)
nRBC: 0 % (ref 0.0–0.2)

## 2019-12-27 MED ORDER — LENALIDOMIDE 5 MG PO CAPS CTSU E1A11: ORAL_CAPSULE | ORAL | 0 refills | Status: DC

## 2019-12-27 NOTE — Research (Signed)
12/27/2019  Research - [facility] ECOG-ACRIN [id number] Maintenance Cycle 65 Patient into clinic unaccompanied today for evaluation prior to beginning treatment Cycle 65.   Maintenance therapy with Revlimid:  Patient returned completed cycle 64 Medication Calendar, confirming dosing at dose level -3, Revlimid 5mg  every other day, Days 1-21. Patient notes that no doses were missed. Patient given printed appointment calendar with Cycle 65 treatment days marked, to document doses for the next treatment cycle, scheduled to begin today.  Adverse Events: Patient reports that fatigue is unchanged since last visit. Fatigue did not limit self-care (grade 2). She reports continues to have moderate difficulty sleeping on a regular basis and took one sleeping pill this past month. She reports ongoing daily backache (moderate, grade 2), and intermittent knee pain, noted one day, also moderate in nature. Patient also reported two days with moderate bilateral hip pain. Patient reports she took methocarbamol one  day and CBD pill another day for the pain. She reports two days of diarrhea on days 5 and 11 this past cycle, with a total of four stools on day 5 and 1 to 2 on day 11 (grade 1).  Noted that patient should contact the [facility] if diarrhea worsens as treatment with Revlimid may need to be held temporarily. Patient reported she woke up with a hoarse voice on 12/16/19 and it continued through 12/17/19.  She denied any accompanying pain or sore throat. Denied any coughing or allergy symptoms. States she could speak clearly, but voice sounded hoarse (grade 1).  It resolved on it's own without any interventions.    Following patient's departure from clinic, CBC results were received, showing ANC of 1300 with other values within parameters for retreatment. Spoke with patient by phone and notified patient that lab results are acceptable for treatment to begin today. Patient verbalized understanding and confirmed her next  appointment for 01/24/20.    Adverse Event Log Study/Protocol: [facility] ECOG E1A11 Maintenance Cycle 64: 11/29/2019 - 12/27/2019 (end of Cycle = [id number]) Solicited &/or Reportable Events Grade Comments  Anemia Grade 1    Hyperglycemia n/a Not assessed this cycle  Lymphocyte count decreased Grade 0   Neutrophil count decreased Grade 2     Platelet count decreased Grade 0     Diarrhea Grade 1 One day with 4 stools/24hr and another day with 1-2 stools/24 hr period  Dyspnea Grade 0   Edema: limbs Grade 0   Fatigue  Grade 2  Moderate, occasionally limiting ADLs (unchanged from previous)  Fever Grade 0   Insomnia  Grade 2  Moderate difficulty, sleeping pill x 1  Irritability Grade 0   Muscle weakness trunk Grade 0   Nausea Grade 0   Peripheral motor neuropathy Grade 0   Peripheral sensory neuropathy  Grade 2 Moderate symptoms, unchanged from previous  Rash acneiform Grade 0   Vomiting Grade 0   Non-reportable AEs - unsolicited, < Grade 3 (Grade):  Hoarseness (1) - Leukopenia (1) - Moderate pain [grade 2]  Back pain  Knee pain  Hip pain  Foye Spurling, BSN, RN Clinical Research Nurse 12/27/2019 9:00 AM

## 2020-01-13 ENCOUNTER — Other Ambulatory Visit: Payer: Self-pay | Admitting: Hematology and Oncology

## 2020-01-13 DIAGNOSIS — C9001 Multiple myeloma in remission: Secondary | ICD-10-CM

## 2020-01-13 NOTE — Telephone Encounter (Signed)
Pls refill electronically °

## 2020-01-24 ENCOUNTER — Other Ambulatory Visit: Payer: Self-pay

## 2020-01-24 ENCOUNTER — Encounter: Payer: Self-pay | Admitting: Hematology and Oncology

## 2020-01-24 ENCOUNTER — Inpatient Hospital Stay: Payer: Medicare Other | Attending: Hematology and Oncology

## 2020-01-24 ENCOUNTER — Encounter: Payer: Self-pay | Admitting: *Deleted

## 2020-01-24 DIAGNOSIS — Z006 Encounter for examination for normal comparison and control in clinical research program: Secondary | ICD-10-CM | POA: Insufficient documentation

## 2020-01-24 DIAGNOSIS — C9001 Multiple myeloma in remission: Secondary | ICD-10-CM

## 2020-01-24 LAB — CBC WITH DIFFERENTIAL (CANCER CENTER ONLY)
Abs Immature Granulocytes: 0.03 10*3/uL (ref 0.00–0.07)
Basophils Absolute: 0 10*3/uL (ref 0.0–0.1)
Basophils Relative: 1 %
Eosinophils Absolute: 0.1 10*3/uL (ref 0.0–0.5)
Eosinophils Relative: 2 %
HCT: 37.7 % (ref 36.0–46.0)
Hemoglobin: 11.8 g/dL — ABNORMAL LOW (ref 12.0–15.0)
Immature Granulocytes: 1 %
Lymphocytes Relative: 53 %
Lymphs Abs: 1.9 10*3/uL (ref 0.7–4.0)
MCH: 23.2 pg — ABNORMAL LOW (ref 26.0–34.0)
MCHC: 31.3 g/dL (ref 30.0–36.0)
MCV: 74.2 fL — ABNORMAL LOW (ref 80.0–100.0)
Monocytes Absolute: 0.4 10*3/uL (ref 0.1–1.0)
Monocytes Relative: 11 %
Neutro Abs: 1.2 10*3/uL — ABNORMAL LOW (ref 1.7–7.7)
Neutrophils Relative %: 32 %
Platelet Count: 219 10*3/uL (ref 150–400)
RBC: 5.08 MIL/uL (ref 3.87–5.11)
RDW: 14.7 % (ref 11.5–15.5)
WBC Count: 3.6 10*3/uL — ABNORMAL LOW (ref 4.0–10.5)
nRBC: 0 % (ref 0.0–0.2)

## 2020-01-24 MED ORDER — LENALIDOMIDE 5 MG PO CAPS CTSU E1A11: ORAL_CAPSULE | ORAL | 0 refills | Status: DC

## 2020-01-24 NOTE — Research (Signed)
01/24/2020  Research - [facility] ECOG-ACRIN [id number] Maintenance Cycle 66 Patient into clinic unaccompanied today for evaluation prior to beginning treatment Cycle 66.   Maintenance therapy with Revlimid:  Patient returned completed cycle 65 Medication Calendar, confirming dosing at dose level -3, Revlimid 5mg  every other day, Days 1-21. Patient notes that no doses were missed. Patient given printed appointment calendar with Cycle 66 treatment days marked, to document doses for the next treatment cycle, scheduled to begin today.  Adverse Events: Patient reports that fatigue is unchanged since last visit. Fatigue did not limit self-care (grade 2). She reports ongoing daily backache (moderate, grade 2) most days with 3 days of reported "serious" back pain (grade 3) which did limit some self care. Patient reports she took oxycodone, CBD gummy, used ice packs and had to stay in bed most of at least one of those days.  Patient also noted one day with left hip pain, moderate in nature.  Patient states peripheral neuropathy symptoms are unchanged from previous visit.    Following patient's departure from clinic, CBC results were received, showing ANC of 1200 with other values within parameters for retreatment. Spoke with patient by phone and notified patient that lab results are acceptable for treatment to begin today. Patient verbalized understanding and confirmed her next appointment for 02/14/20 for labwork and to see Dr. Alvy Bimler the following week on 02/21/20. .    Adverse Event Log Study/Protocol: [facility] ECOG E1A11 Maintenance Cycle 65: 12/27/2019-01/24/2020 (end of Cycle = 123456) Solicited &/or Reportable Events Grade Comments  Anemia Grade 1    Hyperglycemia n/a Not assessed this cycle  Lymphocyte count decreased Grade 0   Neutrophil count decreased Grade 2     Platelet count decreased Grade 0     Diarrhea Grade 0   Dyspnea Grade 0   Edema: limbs Grade 0   Fatigue  Grade 2  Moderate, occasionally  limiting ADLs (unchanged from previous)  Fever Grade 0   Insomnia  Grade 0    Irritability Grade 0   Muscle weakness trunk Grade 0   Nausea Grade 0   Peripheral motor neuropathy Grade 0   Peripheral sensory neuropathy  Grade 2 Moderate symptoms, unchanged from previous visit  Rash acneiform Grade 0   Vomiting Grade 0   Grade 3 Unsolicited AE; Back pain 01/10/20-01/12/20 Non-reportable AEs - unsolicited, < Grade 3 (Grade): - Leukopenia (1) - Moderate pain [grade 2]  Back pain  Hip pain  Foye Spurling, BSN, RN Clinical Research Nurse 01/24/2020 9:00 AM

## 2020-02-03 NOTE — Progress Notes (Signed)
This encounter was created in error - please disregard.

## 2020-02-12 ENCOUNTER — Other Ambulatory Visit: Payer: Self-pay | Admitting: Hematology and Oncology

## 2020-02-12 DIAGNOSIS — C9001 Multiple myeloma in remission: Secondary | ICD-10-CM

## 2020-02-13 NOTE — Telephone Encounter (Signed)
Pls refill electronically °

## 2020-02-14 ENCOUNTER — Other Ambulatory Visit: Payer: Self-pay

## 2020-02-14 ENCOUNTER — Inpatient Hospital Stay: Payer: Medicare Other | Attending: Hematology and Oncology

## 2020-02-14 DIAGNOSIS — Z006 Encounter for examination for normal comparison and control in clinical research program: Secondary | ICD-10-CM | POA: Insufficient documentation

## 2020-02-14 DIAGNOSIS — C9001 Multiple myeloma in remission: Secondary | ICD-10-CM | POA: Diagnosis present

## 2020-02-14 DIAGNOSIS — D701 Agranulocytosis secondary to cancer chemotherapy: Secondary | ICD-10-CM | POA: Insufficient documentation

## 2020-02-14 DIAGNOSIS — M542 Cervicalgia: Secondary | ICD-10-CM | POA: Insufficient documentation

## 2020-02-14 DIAGNOSIS — Z79899 Other long term (current) drug therapy: Secondary | ICD-10-CM | POA: Insufficient documentation

## 2020-02-14 LAB — CBC WITH DIFFERENTIAL (CANCER CENTER ONLY)
Abs Immature Granulocytes: 0.01 10*3/uL (ref 0.00–0.07)
Basophils Absolute: 0 10*3/uL (ref 0.0–0.1)
Basophils Relative: 1 %
Eosinophils Absolute: 0.1 10*3/uL (ref 0.0–0.5)
Eosinophils Relative: 3 %
HCT: 37.8 % (ref 36.0–46.0)
Hemoglobin: 11.8 g/dL — ABNORMAL LOW (ref 12.0–15.0)
Immature Granulocytes: 0 %
Lymphocytes Relative: 46 %
Lymphs Abs: 1.6 10*3/uL (ref 0.7–4.0)
MCH: 23.1 pg — ABNORMAL LOW (ref 26.0–34.0)
MCHC: 31.2 g/dL (ref 30.0–36.0)
MCV: 74.1 fL — ABNORMAL LOW (ref 80.0–100.0)
Monocytes Absolute: 0.5 10*3/uL (ref 0.1–1.0)
Monocytes Relative: 13 %
Neutro Abs: 1.3 10*3/uL — ABNORMAL LOW (ref 1.7–7.7)
Neutrophils Relative %: 37 %
Platelet Count: 214 10*3/uL (ref 150–400)
RBC: 5.1 MIL/uL (ref 3.87–5.11)
RDW: 15 % (ref 11.5–15.5)
WBC Count: 3.4 10*3/uL — ABNORMAL LOW (ref 4.0–10.5)
nRBC: 0 % (ref 0.0–0.2)

## 2020-02-14 LAB — CMP (CANCER CENTER ONLY)
ALT: 21 U/L (ref 0–44)
AST: 19 U/L (ref 15–41)
Albumin: 3.5 g/dL (ref 3.5–5.0)
Alkaline Phosphatase: 49 U/L (ref 38–126)
Anion gap: 10 (ref 5–15)
BUN: 8 mg/dL (ref 8–23)
CO2: 25 mmol/L (ref 22–32)
Calcium: 8.6 mg/dL — ABNORMAL LOW (ref 8.9–10.3)
Chloride: 107 mmol/L (ref 98–111)
Creatinine: 0.84 mg/dL (ref 0.44–1.00)
GFR, Est AFR Am: >60 mL/min (ref >60)
GFR, Estimated: >60 mL/min (ref >60)
Glucose, Bld: 85 mg/dL (ref 70–99)
Potassium: 3.4 mmol/L — ABNORMAL LOW (ref 3.5–5.1)
Sodium: 142 mmol/L (ref 135–145)
Total Bilirubin: 0.5 mg/dL (ref 0.3–1.2)
Total Protein: 7.2 g/dL (ref 6.5–8.1)

## 2020-02-14 LAB — LACTATE DEHYDROGENASE: LDH: 170 U/L (ref 98–192)

## 2020-02-14 LAB — TSH: TSH: 2.404 u[IU]/mL (ref 0.308–3.960)

## 2020-02-15 LAB — KAPPA/LAMBDA LIGHT CHAINS
Kappa free light chain: 27 mg/L — ABNORMAL HIGH (ref 3.3–19.4)
Kappa, lambda light chain ratio: 1.29 (ref 0.26–1.65)
Lambda free light chains: 20.9 mg/L (ref 5.7–26.3)

## 2020-02-17 LAB — MULTIPLE MYELOMA PANEL, SERUM
Albumin SerPl Elph-Mcnc: 3.6 g/dL (ref 2.9–4.4)
Albumin/Glob SerPl: 1.1 (ref 0.7–1.7)
Alpha 1: 0.2 g/dL (ref 0.0–0.4)
Alpha2 Glob SerPl Elph-Mcnc: 0.5 g/dL (ref 0.4–1.0)
B-Globulin SerPl Elph-Mcnc: 1 g/dL (ref 0.7–1.3)
Gamma Glob SerPl Elph-Mcnc: 1.6 g/dL (ref 0.4–1.8)
Globulin, Total: 3.3 g/dL (ref 2.2–3.9)
IgA: 121 mg/dL (ref 87–352)
IgG (Immunoglobin G), Serum: 1796 mg/dL — ABNORMAL HIGH (ref 586–1602)
IgM (Immunoglobulin M), Srm: 59 mg/dL (ref 26–217)
Total Protein ELP: 6.9 g/dL (ref 6.0–8.5)

## 2020-02-20 ENCOUNTER — Other Ambulatory Visit: Payer: Self-pay | Admitting: *Deleted

## 2020-02-21 ENCOUNTER — Inpatient Hospital Stay (HOSPITAL_BASED_OUTPATIENT_CLINIC_OR_DEPARTMENT_OTHER): Payer: Medicare Other | Admitting: Hematology and Oncology

## 2020-02-21 ENCOUNTER — Other Ambulatory Visit: Payer: Self-pay

## 2020-02-21 ENCOUNTER — Inpatient Hospital Stay: Payer: Medicare Other | Admitting: *Deleted

## 2020-02-21 ENCOUNTER — Encounter: Payer: Self-pay | Admitting: Hematology and Oncology

## 2020-02-21 ENCOUNTER — Telehealth: Payer: Self-pay | Admitting: Hematology and Oncology

## 2020-02-21 DIAGNOSIS — Z006 Encounter for examination for normal comparison and control in clinical research program: Secondary | ICD-10-CM

## 2020-02-21 DIAGNOSIS — D61811 Other drug-induced pancytopenia: Secondary | ICD-10-CM

## 2020-02-21 DIAGNOSIS — M542 Cervicalgia: Secondary | ICD-10-CM

## 2020-02-21 DIAGNOSIS — C9001 Multiple myeloma in remission: Secondary | ICD-10-CM | POA: Diagnosis not present

## 2020-02-21 MED ORDER — LENALIDOMIDE 5 MG PO CAPS CTSU E1A11: ORAL_CAPSULE | ORAL | 0 refills | Status: DC

## 2020-02-21 NOTE — Assessment & Plan Note (Signed)
She has mild neck pain which is unrelated to treatment This is secondary to mild strain from recent lifting Recommend observe only for now

## 2020-02-21 NOTE — Progress Notes (Signed)
[facility] OFFICE PROGRESS NOTE  Patient Care Team: Wenda Low, MD as PCP - General (Internal Medicine) Heath Lark, MD as Consulting Physician (Hematology and Oncology) Benson Norway, RN as Registered Nurse (Oncology)  ASSESSMENT & PLAN:  Multiple myeloma in remission [facility]) Her recent myeloma panel showed that she is in remission The patient is comfortable to remain on Revlimid indefinitely. She will continue treatment per research protocol I will see her back as scheduled The research nurse is informed She will continue calcium with vitamin D supplement and aspirin for DVT prophylaxis We discussed the risk and benefits of Covid vaccination There is no contraindication for her to proceed  Drug-induced pancytopenia [facility]) This is likely due to recent treatment. The patient denies recent history of fevers, cough, chills, diarrhea or dysuria. She is asymptomatic from the leukopenia. I will observe for now.  I will continue the chemotherapy at current dose without dosage adjustment.  If the leukopenia gets progressive worse in the future, I might have to delay her treatment or adjust the chemotherapy dose per protocol   Neck pain She has mild neck pain which is unrelated to treatment This is secondary to mild strain from recent lifting Recommend observe only for now   No orders of the defined types were placed in this encounter.   All questions were answered. The patient knows to call the clinic with any problems, questions or concerns. The total time spent in the appointment was 20 minutes encounter with patients including review of chart and various tests results, discussions about plan of care and coordination of care plan   Heath Lark, MD 02/21/2020 10:58 AM  INTERVAL HISTORY: Please see below for problem oriented charting. She returns for further follow-up for multiple myeloma She tolerated treatment very well No recent infection, fever or chills No new  side effects She strained her neck recently after lifting a wheelchair Because a small pain at the back of her neck but no neurological deficits Her energy level is fair Her appetite is good  SUMMARY OF ONCOLOGIC HISTORY: Oncology History Overview Note  ISS stage 1 IgG lambda subtype (serum albumin 3.6, Beta2 microglobulin 2.32) Durie Salmon Stage 1   Multiple myeloma in remission ([place])  10/10/2013 Imaging   Skeletal survery was negative   11/09/2013 Bone Marrow Biopsy   BM biopsy confirmed myeloma, 76% involved, IgG lambda subtype   12/06/2013 - 08/29/2014 Chemotherapy   Sh received chemo with revlimid, Velcade, Dexamethasone and Zometa. 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   05/05/2014 Imaging   Imaging study of the neck showed no explanation that could cause right neck pain. She is noted to have incidental left upper lung nodule. Plan to repeat imaging study in 3 months.   09/06/2014 Imaging   Bone survey showed no evidence of fracture   09/14/2014 Bone Marrow Biopsy   Bone marrow biopsy showed 8% residual plasma cells by manual count but none on the biopsy specimen   09/26/2014 -  Chemotherapy   She is started on cycle 1 of maintenance Revlimid   09/26/2014 -  Chemotherapy   The patient had [No matching medication found in this treatment plan]  for chemotherapy treatment.    01/31/2015 Imaging    chest x-ray showed pneumonia. Treatment was placed on hold.   05/03/2015  Bone Marrow Biopsy   Accession: [id number] repeat bone marrow aspirate and biopsy show 5% residual plasma cells   10/14/2016 Bone Marrow Biopsy   Bone marrow biopsy showed the plasma cells represent 4% of all cells with lack of large aggregates or sheets. To assess the plasma cell clonality, immunohistochemical stains is performed  and it lack clonality. Normal cytogenetics and FISH   01/09/2017 Imaging   CT chest showed ground-glass 1.5 cm apical left upper lobe pulmonary nodule. Initial follow-up with CT at 6-12 months is recommended to confirm persistence. If persistent, repeat CT is recommended every 2 years until 5 years of stability has been established. This recommendation follows the consensus statement: Guidelines for Management of Incidental Pulmonary Nodules Detected on CT Images: From the Fleischner Society 2017; Radiology 2017; 284:228-243. 2. Mild patchy ground-glass opacities in the right upper lobe, probably inflammatory, which can also be reassessed on follow-up chest CT performed for the above dominant ground-glass nodule. 3. Solitary 3 mm right lower lobe solid pulmonary nodule, which can also be reassessed on follow-up chest CT performed for the above dominant ground-glass nodule. 4. No thoracic adenopathy. 5. Aortic atherosclerosis. Two-vessel coronary atherosclerosis.   06/30/2017 Imaging   1. No interval change in the 11 x 13 mm ground-glass nodule left upper lobe. Given the nearly 6 months of imaging stability, repeat CT is recommended every 2 years until 5 years of stability has been established. This recommendation follows the consensus statement: Guidelines for Management of Incidental Pulmonary Nodules Detected on CT Images: From the Fleischner Society 2017; Radiology 2017; 284:228-243. 2. Interval resolution of the tiny patchy ground-glass nodules right upper lobe, likely infectious/inflammatory etiology. 3. Stable 3 mm right lower lobe pulmonary nodule. Attention on follow-up recommended. 4. Aortic Atherosclerois (ICD10-170.0)   09/29/2017 Imaging   Skeletal survey 1. Questionable new tiny lucency noted the posterior portion of C3 vertebral body. This may represent a small lytic lesion.  2. No definite thoracic lesion noted on today's exam. Stable lucencies in the left ilium and acetabulum.  3.  No other focal abnormalities identified. The left hip is unremarkable .     REVIEW OF SYSTEMS:   Constitutional: Denies fevers, chills or abnormal weight loss Eyes: Denies blurriness of vision Ears, nose, mouth, throat, and face: Denies mucositis or sore throat Respiratory: Denies cough, dyspnea or wheezes Cardiovascular: Denies palpitation, chest discomfort or lower extremity swelling Gastrointestinal:  Denies nausea, heartburn or change in bowel habits Skin: Denies abnormal skin rashes Lymphatics: Denies new lymphadenopathy or easy bruising Neurological:Denies numbness, tingling or new weaknesses Behavioral/Psych: Mood is stable, no new changes  All other systems were reviewed with the patient and are negative.  I have reviewed the past medical history, past surgical history, social history and family history with the patient and they are unchanged from previous note.  ALLERGIES:  has No Known Allergies.  MEDICATIONS:  Current Outpatient Medications  Medication Sig Dispense Refill  . aspirin 325 MG tablet Take 325 mg by mouth daily.     Marland Kitchen CANNABIDIOL PO Take 500 mg by mouth as needed.    . Cholecalciferol (VITAMIN D3) 2000 UNITS TABS Take 2,000 Units by mouth daily.    . hydrochlorothiazide (HYDRODIURIL) 25 MG tablet Take 25 mg by mouth every evening.     Marland Kitchen lenalidomide (REVLIMID) 5 MG capsule TAKE 1 CAPSULE BY MOUTH  EVERY OTHER DAY FOR 21 DAYS ON, THEN 7 DAYS OFF 11 capsule 0  . levothyroxine (SYNTHROID, LEVOTHROID) 75 MCG tablet Take 75 mcg by  mouth daily before breakfast.    . methocarbamol (ROBAXIN) 500 MG tablet Take 1 tablet (500 mg total) by mouth every 8 (eight) hours as needed for muscle spasms. 90 tablet 1  . Multiple Vitamins-Minerals (CENTRUM SILVER PO) Take 1 tablet by mouth daily.     . polyethylene glycol (MIRALAX / GLYCOLAX) packet Take 17 g by mouth daily as needed for mild constipation.     . RESTASIS 0.05 % ophthalmic emulsion PLACE 1 DROP IN BOTH EYES IN THE  EVENING  4  . sitaGLIPtin (JANUVIA) 50 MG tablet Take 50 mg by mouth daily.    . traZODone (DESYREL) 50 MG tablet TAKE 1 TABLET (50 MG TOTAL) BY MOUTH AT BEDTIME. 90 tablet 3  . venlafaxine XR (EFFEXOR-XR) 150 MG 24 hr capsule Take 1 capsule (150 mg total) by mouth daily with breakfast. 90 capsule 3  . verapamil (VERELAN PM) 120 MG 24 hr capsule Take 1 capsule (120 mg total) by mouth daily. 90 capsule 3   No current facility-administered medications for this visit.   Facility-Administered Medications Ordered in Other Visits  Medication Dose Route Frequency Provider Last Rate Last Admin  . gadopentetate dimeglumine (MAGNEVIST) injection 15 mL  15 mL Intravenous Once PRN Melvenia Beam, MD        PHYSICAL EXAMINATION: ECOG PERFORMANCE STATUS: 1 - Symptomatic but completely ambulatory  Vitals:   02/21/20 1050  BP: 116/73  Pulse: (!) 18  Resp: (!) 98  Temp: 97.8 F (36.6 C)  SpO2: 96%   Filed Weights   02/21/20 1050  Weight: 163 lb 12.8 oz (74.3 kg)    GENERAL:alert, no distress and comfortable SKIN: skin color, texture, turgor are normal, no rashes or significant lesions EYES: normal, Conjunctiva are pink and non-injected, sclera clear OROPHARYNX:no exudate, no erythema and lips, buccal mucosa, and tongue normal  NECK: supple, thyroid normal size, non-tender, without nodularity LYMPH:  no palpable lymphadenopathy in the cervical, axillary or inguinal LUNGS: clear to auscultation and percussion with normal breathing effort HEART: regular rate & rhythm and no murmurs and no lower extremity edema ABDOMEN:abdomen soft, non-tender and normal bowel sounds Musculoskeletal:no cyanosis of digits and no clubbing  NEURO: alert & oriented x 3 with fluent speech, no focal motor/sensory deficits  LABORATORY DATA:  I have reviewed the data as listed    Component Value Date/Time   NA 142 02/14/2020 0742   NA 141 09/29/2017 0830   K 3.4 (L) 02/14/2020 0742   K 3.6 09/29/2017 0830   CL  107 02/14/2020 0742   CO2 25 02/14/2020 0742   CO2 29 09/29/2017 0830   GLUCOSE 85 02/14/2020 0742   GLUCOSE 87 09/29/2017 0830   BUN 8 02/14/2020 0742   BUN 14.0 09/29/2017 0830   CREATININE 0.84 02/14/2020 0742   CREATININE 0.9 09/29/2017 0830   CALCIUM 8.6 (L) 02/14/2020 0742   CALCIUM 9.6 09/29/2017 0830   PROT 7.2 02/14/2020 0742   PROT 7.3 09/29/2017 0832   PROT 8.0 09/29/2017 0830   ALBUMIN 3.5 02/14/2020 0742   ALBUMIN 3.8 09/29/2017 0830   AST 19 02/14/2020 0742   AST 21 09/29/2017 0830   ALT 21 02/14/2020 0742   ALT 69 (H) 09/29/2017 0830   ALKPHOS 49 02/14/2020 0742   ALKPHOS 60 09/29/2017 0830   BILITOT 0.5 02/14/2020 0742   BILITOT 0.46 09/29/2017 0830   GFRNONAA >60 02/14/2020 0742   GFRAA >60 02/14/2020 0742    No results found for: SPEP, UPEP  Lab Results  Component Value Date   WBC 3.4 (L) 02/14/2020   NEUTROABS 1.3 (L) 02/14/2020   HGB 11.8 (L) 02/14/2020   HCT 37.8 02/14/2020   MCV 74.1 (L) 02/14/2020   PLT 214 02/14/2020      Chemistry      Component Value Date/Time   NA 142 02/14/2020 0742   NA 141 09/29/2017 0830   K 3.4 (L) 02/14/2020 0742   K 3.6 09/29/2017 0830   CL 107 02/14/2020 0742   CO2 25 02/14/2020 0742   CO2 29 09/29/2017 0830   BUN 8 02/14/2020 0742   BUN 14.0 09/29/2017 0830   CREATININE 0.84 02/14/2020 0742   CREATININE 0.9 09/29/2017 0830      Component Value Date/Time   CALCIUM 8.6 (L) 02/14/2020 0742   CALCIUM 9.6 09/29/2017 0830   ALKPHOS 49 02/14/2020 0742   ALKPHOS 60 09/29/2017 0830   AST 19 02/14/2020 0742   AST 21 09/29/2017 0830   ALT 21 02/14/2020 0742   ALT 69 (H) 09/29/2017 0830   BILITOT 0.5 02/14/2020 0742   BILITOT 0.46 09/29/2017 0830

## 2020-02-21 NOTE — Assessment & Plan Note (Signed)
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 She will continue treatment per research protocol I will see her back as scheduled The research nurse is informed She will continue calcium with vitamin D supplement and aspirin for DVT prophylaxis We discussed the risk and benefits of Covid vaccination There is no contraindication for her to proceed

## 2020-02-21 NOTE — Telephone Encounter (Signed)
Scheduled per 3/23 sch msg. Called pt unable to leave a msg. Mailing printout

## 2020-02-21 NOTE — Assessment & Plan Note (Signed)
This is likely due to recent treatment. The patient denies recent history of fevers, cough, chills, diarrhea or dysuria. She is asymptomatic from the leukopenia. I will observe for now.  I will continue the chemotherapy at current dose without dosage adjustment.  If the leukopenia gets progressive worse in the future, I might have to delay her treatment or adjust the chemotherapy dose per protocol

## 2020-02-21 NOTE — Research (Signed)
Research - [facility] ECOG-ACRIN [id number] Maintenance Cycle 67  Patient into clinic unaccompanied today for evaluation prior to beginning treatment Cycle 67.   Labs w/myeloma panel: Obtained on 02/14/2020 within 7-day window per protocol, to allow adequate time for results prior to treatment. Results reviewed by Dr. Alvy Bimler and found to be within parameters for continued dosing at Maintenance phase Dose Level -3, Revlimid 5 mg every other day for 21 days, every 28 days. Review of myeloma panel revealed evidence of Complete Response. Per MD, bone marrow biopsy and aspirate would not be considered standard of care at this time.   See MD note for history and physical exam. Patient reports side effects over the past month including her usual complaints of back and bone pain, as well as moderate generalized pain on Day 9 of cycle (grade 2). Patient reports neck strain which occurred this past weekend after moving a wheelchair. She continues to report ongoing fatigue, not limiting self-care (grade 2). She reports taking no sleeping pills over the past month, although she generally continues to have moderate difficulty sleeping on a regular basis (grade 2 insomnia). Peripheral sensory neuropathy is unchanged with moderate symptoms (grade 2).  Based on lab results review and history and physical by Dr. Alvy Bimler, patient meets criteria for continued treatment with Revlimid, which she is in agreement to do. Patient is aware that treatment will continue indefinitely, unless there is evidence of disease recurrence, adverse effects requiring treatment discontinuation, or patient/physician decision to discontinue therapy.  Maintenance therapy with Revlimid:  Patient returned completed cycle 66 Medication Calendar, confirming dosing at dose level -3, Revlimid '5mg'$  every other day, Days 1-21. Patient notes that no doses were missed. Patient given printed appointment calendar with Cycle 67 treatment days marked, to document doses for  the next treatment cycle, scheduled to begin today.  Patient will continue with monthly clinic visits on 03/20/2020 and 04/17/2020 for hematology and AE assessments prior to beginning subsequent treatment cycles. Myeloma panel and other labs to be performed on 05/08/2020 in advance of the next MD clinic visit at the end of Cycle 70, expected to occur on 05/15/2020.  [name] BSN, RN, [place] 02/21/2020 3:23 PM   Adverse Event Log Study/Protocol: [facility] ECOG E1A11 Maintenance Cycles 64-66: 11/29/2019 - 02/21/2020 (end of Cycle 66 = 1/51/7616) Solicited &/or Reportable Events Grade Attribution to lenalidomide Cycle # Comments  Anemia Grade 1 Definite 64-66    Hyperglycemia Grade 0 -    Lymphocyte count decreased Grade 0 -    Neutrophil count decreased Grade 2  Definite 64-66   Platelet count decreased Grade 0 -     Diarrhea Grade 1 Probable 64   Dyspnea Grade 0 -    Edema: limbs Grade 0 -    Fatigue  Grade 2  Probable 64-66 Moderate, occasionally limiting ADLs (unchanged from previous)  Fever Grade 0 -    Insomnia  Grade 2   Unlikely 64, 66 Moderate difficulty  Irritability Grade 0 -    Muscle weakness trunk Grade 0 -    Nausea Grade 0 -    Peripheral motor neuropathy Grade 0     Peripheral sensory neuropathy  Grade 2 Unlikely 64-66 Moderate symptoms, unchanged from previous  Rash acneiform Grade 0 -    Vomiting Grade 0 -    Non-reportable AEs (unsolicited, < Grade 3): - Leukopenia (grade 1) - Moderate pain (grade 2) o Backache o Bone pain o Knee pain o Hip pain o Generalized (C66, D9) o  Migraine - Neck pain/strain (grade 2) - Weight loss (grade 2) - Hoarseness (grade 1) - Cycle 64 - Hypokalemia (grade 1) [name] BSN, RN, [facility] 02/24/2020 3:19 PM

## 2020-03-05 NOTE — Progress Notes (Deleted)
PATIENT: Carla Perry DOB: May 05, 1956  REASON FOR VISIT: follow up HISTORY FROM: patient  Virtual Visit via Telephone Note  I connected with Shelbie Ammons on 03/05/20 at 11:00 AM EDT by telephone and verified that I am speaking with the correct person using two identifiers.   I discussed the limitations, risks, security and privacy concerns of performing an evaluation and management service by telephone and the availability of in person appointments. I also discussed with the patient that there may be a patient responsible charge related to this service. The patient expressed understanding and agreed to proceed.   History of Present Illness:  03/05/20 Carla Perry is a 64 y.o. female here today for follow up.   History (copied from my note on 12/06/2019)  Carla Perry is a 64 y.o. female here today for follow up for migraines and left sided paresthesias. Carla Perry continues venlafaxine 171m and verapamil 1286mdaily. Carla Perry reports increased frequency of migraines over the past year. Carla Perry has a daily tension type headache. Carla Perry notes that migraines occur 1-3 times per month.  Carla Perry had a migraine in November "that was similar to a stroke." Carla Perry had numbness of the left side of Carla Perry body. Carla Perry had left sided headache described as a pressure sensation. Carla Perry denies weakness, confusion or speech changes. Carla Perry took two tramadol tablets which helped to ease pain. Symptoms lasted about 5-6 hours and gradually eased off. Carla Perry has a history of complicated migraines with similar symptoms. Carla Perry was seen by oncology who sent Carla Perry to the ER for evaluation. Carla Perry had a CT of head that was normal. Carla Perry continues as a participant in research trial for multiple myeloma. Carla Perry is currently taking Revlimid daily.   [facility]  Carla Perry a [age]ear old female with a history of migraine headaches and paresthesias on the left side. Carla Perry returns today for  follow-up. Carla Perry states that Carla Perry has not had a true headache since October. When Carla Perry does get a headache it does occur on the left side of the head Carla Perry typically can take tramadol and the headache resolved quickly. Carla Perry states that Carla Perry has ongoing numbness on the left side of the body. This is been present since Carla Perry saw Dr. LoErling Cruzhe patient's work-up has been relatively unremarkable. He denies any changes in Carla Perry vision, speech denies any weakness. Carla Perry returns today for evaluation.  HISTORY( Copied from [name])  Interval history 06/25/2017: Carla Perry has constant pressure on the left side of the head, Carla Perry has 6/10 daily headache, migraines are on the left side, can last days with light and sound sensitivity, nausea, no vomiting, pounding pain severe, no aura, no medication overuse. Carla Perry takes 2 tramadol at the onset of migraine. Dr. LoErling Cruzad put Carla Perry on Verapamil and Effexor. Carla Perry does feel that the Verapamil helps, if Carla Perry doesn't take it Carla Perry get a migraine.  Interval history 06/25/2016: Carla Perry gets 3-4 migraines a month, they last an hour and go away with the tramadol. Carla Perry still have low back pain and radiation into the legs. Carla Perry has numbness and weakness in the legs intermittently but MRI of the neural axis was negative. Kneeling thing we didn't do was imaging MRI of the lumbar spine. Considering that Carla Perry continues to have numbness and weakness in the legs will image MRI of the lumbar spine.  Interval update 03/18/2016: This is a lovely [age]ear old female with a very complicated past medical history including hypertension, thyroid disorder, migraine,  multiple myeloma s/p chemo currently participating in a research trial, neuropathy, insomnia, bone pain, back pain, joint pain, anemia, fatigue, leukopenia due to antineoplastic chemotherapy, right leg swelling, DVT, diabetes. I have seen patient in the past for chronic migraines but today Carla Perry is here for a new problem,paresthesias that started February.  Happens when waking up in the morning. Never happens otherwise. Carla Perry feels an electrical current through Carla Perry body with persistent tingling. Starts in the thoracic or in the lumbar spine. Feels like electricity. Starts in the spine. Carla Perry feels tingling all over, in the face, arms, legs from head to toe. Will last several hours. A lot of times it is associated with a migraine. Nothing makes it better. Stretching aggravates the symptoms. Carla Perry has pain in the spine associated with the paresthesias. Last night it started with numbness at 2am. Carla Perry couldn't get back to sleep. Carla Perry developed a headache. He still has residual headache and Carla Perry left eye still fells sore and head feels sore. Also a residual burning feeling. Arms feel like they are burning. Episodes becoming more frequent. 6 episodes in Feb, 8 in march, 13 so far in April. Becoming more frequent and lasting longer. Carla Perry experiences facial numbness with the episodes, burning and tingling in the spine that spreads to Carla Perry whole body. The symptoms are usually associated with headache or migraine. Moving around helps with the symptoms. No weakness. No vision changes but does describe eye pain. The headaches are pressure and pain.Carla Perry still gets the vertigo with the migraines. Headache left side of the head with the vertigo, no light or sound sensitivity. 5-10 minutes after the paresthesias Carla Perry has the migraines/headaches. No inciting events. No trauma. Carla Perry has low back pain, situated on the right side, radiates down the legs to the back of the legs, walking is not as good. Right leg goes numb. Paresthesias are more on the left.   Recent labs include normal B12. Normal TSH.  Interval update7/19/2016: This is a former patient of Dr. Janann Colonel who is seen in our office by our nurse practitioner [name]. Patient is establishing care with me today. Carla Perry is a 64 year old female with a history of chronic migraines. Carla Perry was last seen in April. Today Carla Perry returns and Carla Perry  is doing very well. Carla Perry is on verapamil 120 mg extended release and venlafaxine XR 75 mg. Carla Perry is doing very well. Carla Perry has had maybe 3 migraines within the last few months. And they have been on the left side of the head with vertigo twice. When Carla Perry has the vertigo, Carla Perry has to lay in bed and sleep it off. But this is a significant improvement and Carla Perry is very happy with this maintenance. Carla Perry endorses nausea, photophobia and phonophobia. Carla Perry takes tramadol for acute management. Discussed in detail that if Carla Perry is doing well, we will keep Carla Perry on Carla Perry current migraine management. Carla Perry can continue to follow up with [name] as needed.    Observations/Objective:  Generalized: Well developed, in no acute distress  Mentation: Alert oriented to time, place, history taking. Follows all commands speech and language fluent   Assessment and Plan:  64 y.o. year old female  has a past medical history of Abnormal thyroid function test (08/30/2014), Anemia, Anemia, unspecified (10/06/2013), Bone pain (10/21/2013), Bronchitis (01/31/2015), Diabetes mellitus without complication ([place]) (44/0347), Diverticulitis (07/18/2014), DVT (deep venous thrombosis) ([place]) (02/07/2014), HBP (high blood pressure), Insomnia (02/11/2016), Leukopenia due to antineoplastic chemotherapy ([place]) (12/27/2013), Memory loss, MGUS (monoclonal gammopathy of unknown significance), MGUS (monoclonal gammopathy  of unknown significance) (10/06/2013), Migraine, Multiple myeloma, without mention of having achieved remission (11/16/2013), Pancytopenia ([place]) (12/27/2015), Peripheral neuropathy (12/27/2015), Personal history of chemotherapy, Right leg swelling (02/07/2014), Seizure ([place]) (1960), Thyroid disorder, and Vitamin D deficiency (10/24/2014). here with    ICD-10-CM   1. Chronic migraine without aura without status migrainosus, not intractable  G43.709     No orders of the defined types were placed in this encounter.   No orders of the defined types  were placed in this encounter.    Follow Up Instructions:  I discussed the assessment and treatment plan with the patient. The patient was provided an opportunity to ask questions and all were answered. The patient agreed with the plan and demonstrated an understanding of the instructions.   The patient was advised to call back or seek an in-person evaluation if the symptoms worsen or if the condition fails to improve as anticipated.  I provided *** minutes of non-face-to-face time during this encounter.   Debbora Presto, NP

## 2020-03-06 ENCOUNTER — Encounter: Payer: Self-pay | Admitting: Family Medicine

## 2020-03-06 ENCOUNTER — Telehealth: Payer: Medicare Other | Admitting: Family Medicine

## 2020-03-08 ENCOUNTER — Ambulatory Visit: Payer: Medicare Other | Attending: Internal Medicine

## 2020-03-08 DIAGNOSIS — Z23 Encounter for immunization: Secondary | ICD-10-CM

## 2020-03-08 NOTE — Progress Notes (Signed)
   Covid-19 Vaccination Clinic  Name:  Ddnna Kohr    MRN: [id number] DOB: October 24, 1956  03/08/2020  Ms. Hardwell was observed post Covid-19 immunization for 15 minutes without incident. She was provided with Vaccine Information Sheet and instruction to access the V-Safe system.   Ms. Lamkins was instructed to call 911 with any severe reactions post vaccine: Marland Kitchen Difficulty breathing  . Swelling of face and throat  . A fast heartbeat  . A bad rash all over body  . Dizziness and weakness   Immunizations Administered    Name Date Dose VIS Date Route   Pfizer COVID-19 Vaccine 03/08/2020  9:06 AM 0.3 mL 11/11/2019 Intramuscular   Manufacturer: [employer]   Lot: [id number]   [place]: [id number]

## 2020-03-10 ENCOUNTER — Other Ambulatory Visit: Payer: Self-pay | Admitting: Hematology and Oncology

## 2020-03-10 DIAGNOSIS — C9001 Multiple myeloma in remission: Secondary | ICD-10-CM

## 2020-03-12 NOTE — Telephone Encounter (Signed)
Pls refill electronically °

## 2020-03-13 ENCOUNTER — Other Ambulatory Visit: Payer: Self-pay

## 2020-03-13 DIAGNOSIS — C9001 Multiple myeloma in remission: Secondary | ICD-10-CM

## 2020-03-13 MED ORDER — LENALIDOMIDE 5 MG PO CAPS: ORAL_CAPSULE | ORAL | 0 refills | Status: DC

## 2020-03-20 ENCOUNTER — Other Ambulatory Visit: Payer: Medicare Other

## 2020-03-20 ENCOUNTER — Other Ambulatory Visit: Payer: Self-pay

## 2020-03-20 ENCOUNTER — Encounter: Payer: Self-pay | Admitting: Hematology and Oncology

## 2020-03-20 ENCOUNTER — Inpatient Hospital Stay: Payer: Medicare Other | Attending: Hematology and Oncology

## 2020-03-20 ENCOUNTER — Inpatient Hospital Stay: Payer: Medicare Other | Admitting: *Deleted

## 2020-03-20 ENCOUNTER — Other Ambulatory Visit: Payer: Self-pay | Admitting: *Deleted

## 2020-03-20 DIAGNOSIS — Z006 Encounter for examination for normal comparison and control in clinical research program: Secondary | ICD-10-CM | POA: Diagnosis not present

## 2020-03-20 DIAGNOSIS — C9001 Multiple myeloma in remission: Secondary | ICD-10-CM

## 2020-03-20 LAB — CBC WITH DIFFERENTIAL (CANCER CENTER ONLY)
Abs Immature Granulocytes: 0.01 10*3/uL (ref 0.00–0.07)
Basophils Absolute: 0 10*3/uL (ref 0.0–0.1)
Basophils Relative: 1 %
Eosinophils Absolute: 0.1 10*3/uL (ref 0.0–0.5)
Eosinophils Relative: 1 %
HCT: 38.6 % (ref 36.0–46.0)
Hemoglobin: 12 g/dL (ref 12.0–15.0)
Immature Granulocytes: 0 %
Lymphocytes Relative: 55 %
Lymphs Abs: 1.9 10*3/uL (ref 0.7–4.0)
MCH: 23.1 pg — ABNORMAL LOW (ref 26.0–34.0)
MCHC: 31.1 g/dL (ref 30.0–36.0)
MCV: 74.4 fL — ABNORMAL LOW (ref 80.0–100.0)
Monocytes Absolute: 0.4 10*3/uL (ref 0.1–1.0)
Monocytes Relative: 12 %
Neutro Abs: 1.1 10*3/uL — ABNORMAL LOW (ref 1.7–7.7)
Neutrophils Relative %: 31 %
Platelet Count: 205 10*3/uL (ref 150–400)
RBC: 5.19 MIL/uL — ABNORMAL HIGH (ref 3.87–5.11)
RDW: 15.5 % (ref 11.5–15.5)
WBC Count: 3.5 10*3/uL — ABNORMAL LOW (ref 4.0–10.5)
nRBC: 0 % (ref 0.0–0.2)

## 2020-03-20 MED ORDER — LENALIDOMIDE 5 MG PO CAPS CTSU E1A11: ORAL_CAPSULE | ORAL | 0 refills | Status: DC

## 2020-03-20 NOTE — Research (Signed)
03/20/2020  Research - [facility] ECOG-ACRIN [id number] Maintenance Cycle 68 Patient into clinic unaccompanied today for evaluation prior to beginning treatment Cycle 68.   Maintenance therapy with Revlimid:  Patient returned completed cycle 67 Medication Calendar, confirming dosing at dose level -3, Revlimid 5mg  every other day, Days 1-21. Patient notes that no doses were missed. Patient given printed appointment calendar with Cycle 68 treatment days marked, to document doses for the next treatment cycle, scheduled to begin today.  Adverse Events: Patient reports that fatigue is unchanged since last visit. Fatigue did not limit self-care (grade 2). She reports ongoing daily backache (moderate, grade 2).  Patient states peripheral neuropathy symptoms are unchanged from previous visit.  Patient reports one day of feeling irritable or "grouchy and sad."  Patient describes feelings as moderate stating did not interfere with her ability to perform instrumental ADLs, but the feelings stayed with her all day and greatly affected her ability to enjoy any of her activities that day. Patient states she does not know what caused these feelings but she just had a "bad day."  Patient also reports "several hours" of "chest flutters" one day.  Patient denies having any chest pains or pressure. She felt her heart was "racing" and it was not from exertion. Patient states resting at the time and did not feel anxious.  It resolved on it's own.  This same day patient felt her feet were swollen but they did not look swollen or feel swollen to the touch. She describes them as just feeling "tight" like they were swollen even though they were not visibly swollen.  Patient also reports right ear rash/infection for one week that resolved with topical and oral antibiotics.  She thinks the infection was from wearing a old pair of earrings as it started in the piercing area before spreading to the lobe.    Patient received first Covid19 vaccine  on 03/08/20 and subsequently had a mild headache, sore throat and stomach problems later that same day. She took one tramadol for her symptoms.  The symptoms lasted one day.    Following patient's departure from clinic, CBC results were received, showing ANC of 1100 with other values within parameters for retreatment. Spoke with patient's husband by phone and notified that lab results are acceptable for treatment to begin today. Husband verbalized understanding and will relay message to patient.    Adverse Event Log Study/Protocol: [facility] ECOG E1A11 Maintenance Cycle 65: 12/27/2019-01/24/2020 (end of Cycle = 123456) Solicited &/or Reportable Events Grade Comments  Anemia Grade 1    Hyperglycemia n/a Not assessed this cycle  Lymphocyte count decreased Grade 0   Neutrophil count decreased Grade 2     Platelet count decreased Grade 0     Diarrhea Grade 0   Dyspnea Grade 0   Edema: limbs Grade 0   Fatigue  Grade 2  Moderate, occasionally limiting ADLs (unchanged from previous)  Fever Grade 0   Insomnia  Grade 0    Irritability Grade 2   Muscle weakness trunk Grade 0   Nausea Grade 0   Peripheral motor neuropathy Grade 0   Peripheral sensory neuropathy  Grade 2 Moderate symptoms, unchanged from previous visit  Rash acneiform Grade 0   Vomiting Grade 0   Grade 3 Unsolicited AE; None Non-reportable AEs - unsolicited, < Grade 3 (Grade): - Leukopenia (1) - Right ear lobe infection (2) 03/12/20-03/19/20 - Chest Flutters (2) 03/10/20 - Vaccine side effects, headache, sore throat, stomach problems (2) 03/08/20 - Moderate pain [  grade 2]  Back pain  Neck pain  Bone pain  Headache  Foye Spurling, BSN, RN Clinical Research Nurse 03/20/2020 9:00 AM

## 2020-04-03 ENCOUNTER — Ambulatory Visit: Payer: Medicare Other | Attending: Internal Medicine

## 2020-04-03 ENCOUNTER — Encounter: Payer: Self-pay | Admitting: Hematology and Oncology

## 2020-04-03 DIAGNOSIS — Z23 Encounter for immunization: Secondary | ICD-10-CM

## 2020-04-03 NOTE — Progress Notes (Signed)
   Covid-19 Vaccination Clinic  Name:  Carla Perry    MRN: [id number] DOB: Oct 17, 1956  04/03/2020  [name] was observed post Covid-19 immunization for 15 minutes without incident. She was provided with Vaccine Information Sheet and instruction to access the V-Safe system.   [name] was instructed to call 911 with any severe reactions post vaccine: Marland Kitchen Difficulty breathing  . Swelling of face and throat  . A fast heartbeat  . A bad rash all over body  . Dizziness and weakness   Immunizations Administered    Name Date Dose VIS Date Route   Pfizer COVID-19 Vaccine 04/03/2020  8:49 AM 0.3 mL 01/25/2019 Intramuscular   Manufacturer: [place]   Lot: [id number]   [place]: [id number]

## 2020-04-04 ENCOUNTER — Other Ambulatory Visit: Payer: Self-pay | Admitting: Hematology and Oncology

## 2020-04-04 DIAGNOSIS — C9001 Multiple myeloma in remission: Secondary | ICD-10-CM

## 2020-04-12 ENCOUNTER — Other Ambulatory Visit: Payer: Self-pay

## 2020-04-12 DIAGNOSIS — C9001 Multiple myeloma in remission: Secondary | ICD-10-CM

## 2020-04-12 MED ORDER — LENALIDOMIDE 5 MG PO CAPS: ORAL_CAPSULE | ORAL | 0 refills | Status: DC

## 2020-04-17 ENCOUNTER — Encounter: Payer: Self-pay | Admitting: Hematology and Oncology

## 2020-04-17 ENCOUNTER — Inpatient Hospital Stay: Payer: Medicare Other | Admitting: *Deleted

## 2020-04-17 ENCOUNTER — Inpatient Hospital Stay: Payer: Medicare Other | Attending: Hematology and Oncology

## 2020-04-17 ENCOUNTER — Other Ambulatory Visit: Payer: Self-pay

## 2020-04-17 DIAGNOSIS — Z006 Encounter for examination for normal comparison and control in clinical research program: Secondary | ICD-10-CM | POA: Insufficient documentation

## 2020-04-17 DIAGNOSIS — C9001 Multiple myeloma in remission: Secondary | ICD-10-CM

## 2020-04-17 LAB — CBC WITH DIFFERENTIAL (CANCER CENTER ONLY)
Abs Immature Granulocytes: 0.01 10*3/uL (ref 0.00–0.07)
Basophils Absolute: 0 10*3/uL (ref 0.0–0.1)
Basophils Relative: 1 %
Eosinophils Absolute: 0.1 10*3/uL (ref 0.0–0.5)
Eosinophils Relative: 1 %
HCT: 38.2 % (ref 36.0–46.0)
Hemoglobin: 11.8 g/dL — ABNORMAL LOW (ref 12.0–15.0)
Immature Granulocytes: 0 %
Lymphocytes Relative: 50 %
Lymphs Abs: 1.7 10*3/uL (ref 0.7–4.0)
MCH: 23 pg — ABNORMAL LOW (ref 26.0–34.0)
MCHC: 30.9 g/dL (ref 30.0–36.0)
MCV: 74.5 fL — ABNORMAL LOW (ref 80.0–100.0)
Monocytes Absolute: 0.4 10*3/uL (ref 0.1–1.0)
Monocytes Relative: 12 %
Neutro Abs: 1.2 10*3/uL — ABNORMAL LOW (ref 1.7–7.7)
Neutrophils Relative %: 36 %
Platelet Count: 210 10*3/uL (ref 150–400)
RBC: 5.13 MIL/uL — ABNORMAL HIGH (ref 3.87–5.11)
RDW: 15.4 % (ref 11.5–15.5)
WBC Count: 3.5 10*3/uL — ABNORMAL LOW (ref 4.0–10.5)
nRBC: 0 % (ref 0.0–0.2)

## 2020-04-17 MED ORDER — LENALIDOMIDE 5 MG PO CAPS CTSU E1A11: ORAL_CAPSULE | ORAL | 0 refills | Status: DC

## 2020-04-17 NOTE — Addendum Note (Signed)
Addended by: Benson Norway on: 04/17/2020 09:21 AM   Modules accepted: Orders

## 2020-04-17 NOTE — Research (Signed)
04/17/2020 Research - [facility] ECOG-ACRIN [id number] Maintenance Cycle 69  Patient into clinic unaccompanied today for evaluation prior to beginning treatment Cycle 69.   Maintenance therapy with Revlimid:  Patient returned completed cycle 68 Medication Calendar, confirming dosing at dose level -3, Revlimid 5mg  every other day, Days 1-21. Patient notes that no doses were missed. Patient given printed appointment calendar with Cycle 69 treatment days marked, to document doses for the next treatment cycle, scheduled to begin today. Patient states that Revlimid refill is due to arrive today.  Adverse Events: Patient reports ongoing fatigue, not limiting self-care (grade 2). She reports taking no sleeping pills over the past month, and states that she only has mild difficulty sleeping. Peripheral sensory neuropathy is unchanged (grade 2). Patient also notes the usual complaints of moderate back ache daily, occasional bone pain and isolated instances of leg pain (day 1 of cycle) and right underarm ache on days 6 & 7 with no abnormalities palpated. Pain was moderate (grade 2) and resolved after one dose of tramadol. Patient received her 2nd COVID vaccine on May 4th.   Following patient's departure from clinic, CBC results were received, showing ANC of 1200 with other values within parameters for retreatment. Spoke with patient's husband by phone and requested that he notify patient that lab results are acceptable for treatment to begin today. Mr. Kollias confirmed this information.  [name] BSN, RN, CCRP 04/17/2020 8:40 AM

## 2020-04-17 NOTE — Addendum Note (Signed)
Addended by: Benson Norway on: 04/17/2020 09:08 AM   Modules accepted: Orders

## 2020-04-18 NOTE — Research (Signed)
04/18/2020   Adverse Event Log Study/Protocol: [facility] ECOG E1A11 Maintenance Cycle 68: 03/20/2020 - 04/17/2020 (end of Cycle = [id number]) Solicited &/or Reportable Events Grade Comments  Anemia Grade 1    Hyperglycemia n/a Not assessed this cycle  Lymphocyte count decreased Grade 0   Neutrophil count decreased Grade 2     Platelet count decreased Grade 0     Diarrhea Grade 1 One day with 3 stools/24hr  Dyspnea Grade 0   Edema: limbs Grade 0   Fatigue  Grade 2  Moderate, occasionally limiting ADLs (unchanged from previous)  Fever Grade 0   Insomnia  Grade 1  Mild difficulty  Irritability Grade 0   Muscle weakness trunk Grade 0   Nausea Grade 0   Peripheral motor neuropathy Grade 0   Peripheral sensory neuropathy  Grade 2 Moderate symptoms, unchanged from previous  Rash acneiform Grade 0   Vomiting Grade 0   Non-reportable AEs - unsolicited, < Grade 3 (Grade): - Leukopenia (1) - Headache (2) - Ache, right underarm x 2 days - Moderate pain (2)  Back pain  Bone pain  Leg pain [name] BSN, RN, [place] 04/18/2020 10:28 AM

## 2020-05-06 ENCOUNTER — Other Ambulatory Visit: Payer: Self-pay | Admitting: Hematology and Oncology

## 2020-05-06 DIAGNOSIS — C9001 Multiple myeloma in remission: Secondary | ICD-10-CM

## 2020-05-07 NOTE — Telephone Encounter (Signed)
Check out the dispense pills #, why only 11?

## 2020-05-08 ENCOUNTER — Inpatient Hospital Stay: Payer: Medicare Other | Attending: Hematology and Oncology

## 2020-05-08 ENCOUNTER — Other Ambulatory Visit: Payer: Self-pay

## 2020-05-08 DIAGNOSIS — Z006 Encounter for examination for normal comparison and control in clinical research program: Secondary | ICD-10-CM | POA: Diagnosis not present

## 2020-05-08 DIAGNOSIS — E119 Type 2 diabetes mellitus without complications: Secondary | ICD-10-CM | POA: Insufficient documentation

## 2020-05-08 DIAGNOSIS — D61811 Other drug-induced pancytopenia: Secondary | ICD-10-CM | POA: Insufficient documentation

## 2020-05-08 DIAGNOSIS — C9001 Multiple myeloma in remission: Secondary | ICD-10-CM | POA: Diagnosis present

## 2020-05-08 DIAGNOSIS — Z79899 Other long term (current) drug therapy: Secondary | ICD-10-CM | POA: Insufficient documentation

## 2020-05-08 DIAGNOSIS — K529 Noninfective gastroenteritis and colitis, unspecified: Secondary | ICD-10-CM | POA: Insufficient documentation

## 2020-05-08 LAB — CMP (CANCER CENTER ONLY)
ALT: 24 U/L (ref 0–44)
AST: 21 U/L (ref 15–41)
Albumin: 3.6 g/dL (ref 3.5–5.0)
Alkaline Phosphatase: 52 U/L (ref 38–126)
Anion gap: 11 (ref 5–15)
BUN: 9 mg/dL (ref 8–23)
CO2: 24 mmol/L (ref 22–32)
Calcium: 9.2 mg/dL (ref 8.9–10.3)
Chloride: 106 mmol/L (ref 98–111)
Creatinine: 0.88 mg/dL (ref 0.44–1.00)
GFR, Est AFR Am: >60 mL/min (ref >60)
GFR, Estimated: >60 mL/min (ref >60)
Glucose, Bld: 79 mg/dL (ref 70–99)
Potassium: 3.7 mmol/L (ref 3.5–5.1)
Sodium: 141 mmol/L (ref 135–145)
Total Bilirubin: 0.8 mg/dL (ref 0.3–1.2)
Total Protein: 7.6 g/dL (ref 6.5–8.1)

## 2020-05-08 LAB — CBC WITH DIFFERENTIAL (CANCER CENTER ONLY)
Abs Immature Granulocytes: 0.01 10*3/uL (ref 0.00–0.07)
Basophils Absolute: 0 10*3/uL (ref 0.0–0.1)
Basophils Relative: 1 %
Eosinophils Absolute: 0.1 10*3/uL (ref 0.0–0.5)
Eosinophils Relative: 2 %
HCT: 38.4 % (ref 36.0–46.0)
Hemoglobin: 11.9 g/dL — ABNORMAL LOW (ref 12.0–15.0)
Immature Granulocytes: 0 %
Lymphocytes Relative: 43 %
Lymphs Abs: 1.4 10*3/uL (ref 0.7–4.0)
MCH: 23.3 pg — ABNORMAL LOW (ref 26.0–34.0)
MCHC: 31 g/dL (ref 30.0–36.0)
MCV: 75.3 fL — ABNORMAL LOW (ref 80.0–100.0)
Monocytes Absolute: 0.4 10*3/uL (ref 0.1–1.0)
Monocytes Relative: 12 %
Neutro Abs: 1.4 10*3/uL — ABNORMAL LOW (ref 1.7–7.7)
Neutrophils Relative %: 42 %
Platelet Count: 205 10*3/uL (ref 150–400)
RBC: 5.1 MIL/uL (ref 3.87–5.11)
RDW: 15 % (ref 11.5–15.5)
WBC Count: 3.3 10*3/uL — ABNORMAL LOW (ref 4.0–10.5)
nRBC: 0 % (ref 0.0–0.2)

## 2020-05-08 LAB — LACTATE DEHYDROGENASE: LDH: 178 U/L (ref 98–192)

## 2020-05-08 LAB — TSH: TSH: 1.575 u[IU]/mL (ref 0.308–3.960)

## 2020-05-09 ENCOUNTER — Other Ambulatory Visit: Payer: Self-pay | Admitting: Hematology and Oncology

## 2020-05-09 LAB — KAPPA/LAMBDA LIGHT CHAINS
Kappa free light chain: 26.5 mg/L — ABNORMAL HIGH (ref 3.3–19.4)
Kappa, lambda light chain ratio: 1.42 (ref 0.26–1.65)
Lambda free light chains: 18.6 mg/L (ref 5.7–26.3)

## 2020-05-10 LAB — MULTIPLE MYELOMA PANEL, SERUM
Albumin SerPl Elph-Mcnc: 3.6 g/dL (ref 2.9–4.4)
Albumin/Glob SerPl: 1.1 (ref 0.7–1.7)
Alpha 1: 0.2 g/dL (ref 0.0–0.4)
Alpha2 Glob SerPl Elph-Mcnc: 0.5 g/dL (ref 0.4–1.0)
B-Globulin SerPl Elph-Mcnc: 1.1 g/dL (ref 0.7–1.3)
Gamma Glob SerPl Elph-Mcnc: 1.7 g/dL (ref 0.4–1.8)
Globulin, Total: 3.5 g/dL (ref 2.2–3.9)
IgA: 121 mg/dL (ref 87–352)
IgG (Immunoglobin G), Serum: 1887 mg/dL — ABNORMAL HIGH (ref 586–1602)
IgM (Immunoglobulin M), Srm: 56 mg/dL (ref 26–217)
Total Protein ELP: 7.1 g/dL (ref 6.0–8.5)

## 2020-05-15 ENCOUNTER — Inpatient Hospital Stay: Payer: Medicare Other | Admitting: *Deleted

## 2020-05-15 ENCOUNTER — Inpatient Hospital Stay: Payer: Medicare Other | Admitting: Hematology and Oncology

## 2020-05-15 ENCOUNTER — Other Ambulatory Visit: Payer: Self-pay

## 2020-05-15 ENCOUNTER — Encounter: Payer: Self-pay | Admitting: Hematology and Oncology

## 2020-05-15 ENCOUNTER — Telehealth: Payer: Self-pay | Admitting: Hematology and Oncology

## 2020-05-15 DIAGNOSIS — E119 Type 2 diabetes mellitus without complications: Secondary | ICD-10-CM

## 2020-05-15 DIAGNOSIS — R197 Diarrhea, unspecified: Secondary | ICD-10-CM

## 2020-05-15 DIAGNOSIS — C9001 Multiple myeloma in remission: Secondary | ICD-10-CM | POA: Diagnosis not present

## 2020-05-15 DIAGNOSIS — Z006 Encounter for examination for normal comparison and control in clinical research program: Secondary | ICD-10-CM

## 2020-05-15 DIAGNOSIS — D61811 Other drug-induced pancytopenia: Secondary | ICD-10-CM | POA: Diagnosis not present

## 2020-05-15 MED ORDER — LENALIDOMIDE 5 MG PO CAPS CTSU E1A11: ORAL_CAPSULE | ORAL | 0 refills | Status: DC

## 2020-05-15 NOTE — Assessment & Plan Note (Signed)
She continues to have chronic diarrhea on and off Certainly, Revlimid can contribute to diarrhea but it is not clear to me that is the main reason why she has diarrhea More than likely, it is diet related She will continue Imodium as needed

## 2020-05-15 NOTE — Progress Notes (Signed)
[facility] OFFICE PROGRESS NOTE  Patient Care Team: Wenda Low, MD as PCP - General (Internal Medicine) Heath Lark, MD as Consulting Physician (Hematology and Oncology) Benson Norway, RN as Registered Nurse (Oncology)  ASSESSMENT & PLAN:  Multiple myeloma in remission [facility]) Her recent myeloma panel showed that she is in remission The patient is comfortable to remain on Revlimid indefinitely. She will continue treatment per research protocol I will see her back as scheduled in September The research nurse is informed She will continue calcium with vitamin D supplement and aspirin for DVT prophylaxis She has completed Covid vaccination  Drug-induced pancytopenia ([place]) This is likely due to recent treatment. The patient denies recent history of fevers, cough, chills, diarrhea or dysuria. She is asymptomatic from the leukopenia. I will observe for now.  I will continue the chemotherapy at current dose without dosage adjustment.  If the leukopenia gets progressive worse in the future, I might have to delay her treatment or adjust the chemotherapy dose per protocol   Diarrhea She continues to have chronic diarrhea on and off Certainly, Revlimid can contribute to diarrhea but it is not clear to me that is the main reason why she has diarrhea More than likely, it is diet related She will continue Imodium as needed  Diabetes mellitus without complication ([place]) She is working hard with lifestyle modification and medical management She continues on the trajectory of mild progressive weight loss in a healthy fashion I continue to encourage the patient to continue diet and exercise   No orders of the defined types were placed in this encounter.   All questions were answered. The patient knows to call the clinic with any problems, questions or concerns. The total time spent in the appointment was 20 minutes encounter with patients including review of chart and various  tests results, discussions about plan of care and coordination of care plan   Heath Lark, MD 05/15/2020 8:20 AM  INTERVAL HISTORY: Please see below for problem oriented charting. She returns for further follow-up and research protocol She is doing well She is tolerating chemotherapy well without new side effects No recent infection, fever or chills No new bone pain She has mild intermittent diarrhea which she thinks is food related She is exercising as tolerated She takes pain medicine very rarely  SUMMARY OF ONCOLOGIC HISTORY: Oncology History Overview Note  ISS stage 1 IgG lambda subtype (serum albumin 3.6, Beta2 microglobulin 2.32) Durie Salmon Stage 1   Multiple myeloma in remission ([place])  10/10/2013 Imaging   Skeletal survery was negative   11/09/2013 Bone Marrow Biopsy   BM biopsy confirmed myeloma, 76% involved, IgG lambda subtype   12/06/2013 - 08/29/2014 Chemotherapy   Sh received chemo with revlimid, Velcade, Dexamethasone and Zometa. 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   05/05/2014 Imaging   Imaging study of the neck showed no explanation that could cause right neck pain. She is noted to have incidental left upper lung nodule. Plan to repeat imaging study in 3 months.   09/06/2014 Imaging   Bone survey showed no evidence of fracture   09/14/2014 Bone Marrow Biopsy   Bone marrow biopsy showed 8% residual plasma cells by manual count but none on the biopsy specimen   09/26/2014 -  Chemotherapy   She is started on  cycle 1 of maintenance Revlimid   09/26/2014 -  Chemotherapy   The patient had lenalidomide (REVLIMID) 15 MG capsule, 3 of 3 cycles lenalidomide (REVLIMID) 10 MG capsule, 1 of 1 cycle, Start date: 01/18/2015, End date: 02/07/2015 lenalidomide (REVLIMID) 5 MG capsule, 65 of 70  cycles  for chemotherapy treatment.    01/31/2015 Imaging    chest x-ray showed pneumonia. Treatment was placed on hold.   05/03/2015 Bone Marrow Biopsy   Accession: [id number] repeat bone marrow aspirate and biopsy show 5% residual plasma cells   10/14/2016 Bone Marrow Biopsy   Bone marrow biopsy showed the plasma cells represent 4% of all cells with lack of large aggregates or sheets. To assess the plasma cell clonality, immunohistochemical stains is performed and it lack clonality. Normal cytogenetics and FISH   01/09/2017 Imaging   CT chest showed ground-glass 1.5 cm apical left upper lobe pulmonary nodule. Initial follow-up with CT at 6-12 months is recommended to confirm persistence. If persistent, repeat CT is recommended every 2 years until 5 years of stability has been established. This recommendation follows the consensus statement: Guidelines for Management of Incidental Pulmonary Nodules Detected on CT Images: From the Fleischner Society 2017; Radiology 2017; 284:228-243. 2. Mild patchy ground-glass opacities in the right upper lobe, probably inflammatory, which can also be reassessed on follow-up chest CT performed for the above dominant ground-glass nodule. 3. Solitary 3 mm right lower lobe solid pulmonary nodule, which can also be reassessed on follow-up chest CT performed for the above dominant ground-glass nodule. 4. No thoracic adenopathy. 5. Aortic atherosclerosis. Two-vessel coronary atherosclerosis.   06/30/2017 Imaging   1. No interval change in the 11 x 13 mm ground-glass nodule left upper lobe. Given the nearly 6 months of imaging stability, repeat CT is recommended every 2 years until 5 years of stability has been established. This recommendation follows the consensus statement: Guidelines for Management of Incidental Pulmonary Nodules Detected on CT Images: From the Fleischner Society 2017; Radiology 2017; 284:228-243. 2. Interval resolution of the tiny patchy ground-glass  nodules right upper lobe, likely infectious/inflammatory etiology. 3. Stable 3 mm right lower lobe pulmonary nodule. Attention on follow-up recommended. 4. Aortic Atherosclerois (ICD10-170.0)   09/29/2017 Imaging   Skeletal survey 1. Questionable new tiny lucency noted the posterior portion of C3 vertebral body. This may represent a small lytic lesion.  2. No definite thoracic lesion noted on today's exam. Stable lucencies in the left ilium and acetabulum.  3. No other focal abnormalities identified. The left hip is unremarkable .     REVIEW OF SYSTEMS:   Constitutional: Denies fevers, chills or abnormal weight loss Eyes: Denies blurriness of vision Ears, nose, mouth, throat, and face: Denies mucositis or sore throat Respiratory: Denies cough, dyspnea or wheezes Cardiovascular: Denies palpitation, chest discomfort or lower extremity swelling Skin: Denies abnormal skin rashes Lymphatics: Denies new lymphadenopathy or easy bruising Neurological:Denies numbness, tingling or new weaknesses Behavioral/Psych: Mood is stable, no new changes  All other systems were reviewed with the patient and are negative.  I have reviewed the past medical history, past surgical history, social history and family history with the patient and they are unchanged from previous note.  ALLERGIES:  has No Known Allergies.  MEDICATIONS:  Current Outpatient Medications  Medication Sig Dispense Refill  . aspirin 325 MG tablet Take 325 mg by mouth daily.     Marland Kitchen CANNABIDIOL PO Take 500 mg by mouth as needed.    . Cholecalciferol (VITAMIN D3) 2000 UNITS TABS Take  2,000 Units by mouth daily.    . hydrochlorothiazide (HYDRODIURIL) 25 MG tablet Take 25 mg by mouth every evening.     Marland Kitchen lenalidomide (REVLIMID) 5 MG capsule TAKE 1 CAPSULE BY MOUTH  EVERY OTHER DAY FOR 21 DAYS ON, THEN 7 DAYS OFF 11 capsule 0  . levothyroxine (SYNTHROID, LEVOTHROID) 75 MCG tablet Take 75 mcg by mouth daily before breakfast.    .  methocarbamol (ROBAXIN) 500 MG tablet Take 1 tablet (500 mg total) by mouth every 8 (eight) hours as needed for muscle spasms. 90 tablet 1  . Multiple Vitamins-Minerals (CENTRUM SILVER PO) Take 1 tablet by mouth daily.     . polyethylene glycol (MIRALAX / GLYCOLAX) packet Take 17 g by mouth daily as needed for mild constipation.     . RESTASIS 0.05 % ophthalmic emulsion PLACE 1 DROP IN BOTH EYES IN THE EVENING  4  . sitaGLIPtin (JANUVIA) 50 MG tablet Take 50 mg by mouth daily.    . traZODone (DESYREL) 50 MG tablet TAKE 1 TABLET (50 MG TOTAL) BY MOUTH AT BEDTIME. 90 tablet 3  . venlafaxine XR (EFFEXOR-XR) 150 MG 24 hr capsule Take 1 capsule (150 mg total) by mouth daily with breakfast. 90 capsule 3  . verapamil (VERELAN PM) 120 MG 24 hr capsule Take 1 capsule (120 mg total) by mouth daily. 90 capsule 3   No current facility-administered medications for this visit.   Facility-Administered Medications Ordered in Other Visits  Medication Dose Route Frequency Provider Last Rate Last Admin  . gadopentetate dimeglumine (MAGNEVIST) injection 15 mL  15 mL Intravenous Once PRN Melvenia Beam, MD        PHYSICAL EXAMINATION: ECOG PERFORMANCE STATUS: 1 - Symptomatic but completely ambulatory  Vitals:   05/15/20 0812  BP: 135/87  Pulse: 64  Resp: 18  Temp: 98.1 F (36.7 C)  SpO2: 100%   Filed Weights   05/15/20 0812  Weight: 163 lb 12.8 oz (74.3 kg)    GENERAL:alert, no distress and comfortable SKIN: skin color, texture, turgor are normal, no rashes or significant lesions EYES: normal, Conjunctiva are pink and non-injected, sclera clear OROPHARYNX:no exudate, no erythema and lips, buccal mucosa, and tongue normal  NECK: supple, thyroid normal size, non-tender, without nodularity LYMPH:  no palpable lymphadenopathy in the cervical, axillary or inguinal LUNGS: clear to auscultation and percussion with normal breathing effort HEART: regular rate & rhythm and no murmurs and no lower  extremity edema ABDOMEN:abdomen soft, non-tender and normal bowel sounds Musculoskeletal:no cyanosis of digits and no clubbing  NEURO: alert & oriented x 3 with fluent speech, no focal motor/sensory deficits  LABORATORY DATA:  I have reviewed the data as listed    Component Value Date/Time   NA 141 05/08/2020 0831   NA 141 09/29/2017 0830   K 3.7 05/08/2020 0831   K 3.6 09/29/2017 0830   CL 106 05/08/2020 0831   CO2 24 05/08/2020 0831   CO2 29 09/29/2017 0830   GLUCOSE 79 05/08/2020 0831   GLUCOSE 87 09/29/2017 0830   BUN 9 05/08/2020 0831   BUN 14.0 09/29/2017 0830   CREATININE 0.88 05/08/2020 0831   CREATININE 0.9 09/29/2017 0830   CALCIUM 9.2 05/08/2020 0831   CALCIUM 9.6 09/29/2017 0830   PROT 7.6 05/08/2020 0831   PROT 7.3 09/29/2017 0832   PROT 8.0 09/29/2017 0830   ALBUMIN 3.6 05/08/2020 0831   ALBUMIN 3.8 09/29/2017 0830   AST 21 05/08/2020 0831   AST 21 09/29/2017 0830  ALT 24 05/08/2020 0831   ALT 69 (H) 09/29/2017 0830   ALKPHOS 52 05/08/2020 0831   ALKPHOS 60 09/29/2017 0830   BILITOT 0.8 05/08/2020 0831   BILITOT 0.46 09/29/2017 0830   GFRNONAA >60 05/08/2020 0831   GFRAA >60 05/08/2020 0831    No results found for: SPEP, UPEP  Lab Results  Component Value Date   WBC 3.3 (L) 05/08/2020   NEUTROABS 1.4 (L) 05/08/2020   HGB 11.9 (L) 05/08/2020   HCT 38.4 05/08/2020   MCV 75.3 (L) 05/08/2020   PLT 205 05/08/2020      Chemistry      Component Value Date/Time   NA 141 05/08/2020 0831   NA 141 09/29/2017 0830   K 3.7 05/08/2020 0831   K 3.6 09/29/2017 0830   CL 106 05/08/2020 0831   CO2 24 05/08/2020 0831   CO2 29 09/29/2017 0830   BUN 9 05/08/2020 0831   BUN 14.0 09/29/2017 0830   CREATININE 0.88 05/08/2020 0831   CREATININE 0.9 09/29/2017 0830      Component Value Date/Time   CALCIUM 9.2 05/08/2020 0831   CALCIUM 9.6 09/29/2017 0830   ALKPHOS 52 05/08/2020 0831   ALKPHOS 60 09/29/2017 0830   AST 21 05/08/2020 0831   AST 21 09/29/2017  0830   ALT 24 05/08/2020 0831   ALT 69 (H) 09/29/2017 0830   BILITOT 0.8 05/08/2020 0831   BILITOT 0.46 09/29/2017 0830

## 2020-05-15 NOTE — Assessment & Plan Note (Signed)
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 She will continue treatment per research protocol I will see her back as scheduled in September The research nurse is informed She will continue calcium with vitamin D supplement and aspirin for DVT prophylaxis She has completed Covid vaccination

## 2020-05-15 NOTE — Assessment & Plan Note (Signed)
She is working hard with lifestyle modification and medical management She continues on the trajectory of mild progressive weight loss in a healthy fashion I continue to encourage the patient to continue diet and exercise

## 2020-05-15 NOTE — Assessment & Plan Note (Signed)
This is likely due to recent treatment. The patient denies recent history of fevers, cough, chills, diarrhea or dysuria. She is asymptomatic from the leukopenia. I will observe for now.  I will continue the chemotherapy at current dose without dosage adjustment.  If the leukopenia gets progressive worse in the future, I might have to delay her treatment or adjust the chemotherapy dose per protocol

## 2020-05-15 NOTE — Research (Signed)
05/15/2020  Research - [facility] ECOG-ACRIN [id number] Maintenance Cycle 70  Patient into clinic unaccompanied today for evaluation prior to beginning treatment Cycle 70.   Labs w/myeloma panel: Obtained on 05/08/2020 within 7-day window per protocol, to allow adequate time for results prior to treatment. Results reviewed by Dr. Alvy Bimler and found to be within parameters for continued dosing at Maintenance phase Dose Level -3, Revlimid 5 mg every other day for 21 days, every 28 days. Review of myeloma panel revealed evidence of Complete Response. Per MD, bone marrow biopsy and aspirate would not be considered standard of care at this time.   See MD note for history and physical exam. Patient reports side effects over the past month including her usual complaints of moderate back and bone pain and ongoing peripheral sensory neuropathy, unchanged from previous. Patient reports needing Robaxin on 2 occasions for back pain due to painting doors in her home. She reports taking a narcotic pain pill once for bone pain and trazodone once for moderate insomnia. She continues to report ongoing fatigue, not limiting self-care (grade 2). Patient had one day with diarrhea (3 stools in 24 hours) which she attributed to increased vegetable intake.  Based on lab results review and history and physical by Dr. Alvy Bimler, patient meets criteria for continued treatment with Revlimid, which she is in agreement to do. Patient is aware that treatment will continue indefinitely, unless there is evidence of disease recurrence, adverse effects requiring treatment discontinuation, or patient/physician decision to discontinue therapy.  Maintenance therapy with Revlimid:  Patient returned completed cycle 69 Medication Calendar, confirming dosing at dose level -3, Revlimid 20m every other day, Days 1-21. Patient notes that no doses were missed. Patient given printed appointment calendar with Cycle 70 treatment days marked, to document doses for the  next treatment cycle, scheduled to begin today.  Patient will continue with monthly clinic visits on 06/12/2020 and 07/10/2020 for hematology and AE assessments prior to beginning subsequent treatment cycles. Myeloma panel and other labs, as well as bone survey, to be performed on 07/31/2020 in advance of the next MD clinic visit at the end of Cycle 72, expected to occur on 08/07/2020. Patient notes that she plans to be [place]08/25/20).  Cindy S. SBrigitte PulseBSN, RN, [place]6/15/2021 12:04 PM

## 2020-05-15 NOTE — Telephone Encounter (Signed)
Scheduled per 6/15 sch message. Pt aware of appts.

## 2020-05-18 NOTE — Research (Addendum)
05/18/2020  Adverse Event Log Study/Protocol: [facility] ECOG E1A11 Maintenance Cycles 67-69: 02/21/2020 - 05/15/2020 (end of Cycle 69 = 7/57/97) Solicited &/or Reportable Events Grade Attribution to lenalidomide Cycle # Comments  Anemia Grade 1 Definite 67-69    Hyperglycemia Grade 0 -    Lymphocyte count decreased Grade 0 -    Neutrophil count decreased Grade 2  Definite 67-69   Platelet count decreased Grade 0 -     Diarrhea Grade 1 Possible 68, 69   Dyspnea Grade 0 -    Edema: limbs Grade 0 -    Fatigue  Grade 2  Probable 67-69 Moderate, occasionally limiting ADLs (unchanged from previous)  Fever Grade 0 -    Insomnia  Grade 1-2   Unlikely 68-69 C-68 Mild; C-69 Moderate difficulty; used trazodone x 1  Irritability Grade 2 Unlikely 67   Muscle weakness trunk Grade 0 -    Nausea Grade 0 -    Peripheral motor neuropathy Grade 0     Peripheral sensory neuropathy  Grade 2 Unlikely 67-69 Moderate symptoms, unchanged from previous  Rash acneiform Grade 0 -    Vomiting Grade 0 -     Non-reportable AEs (unsolicited, < Grade 3): - Leukopenia (grade 1) - Moderate pain (grade 2) o Back pain o Bone pain o Neck pain o Leg pain - Rt. ear lobe infection (grade 2) - Chest flutters (grade 2) - Headache (grade 2) - Weight loss (grade 2) - Pain, right axilla (grade 2) - Hypertension (1) - COVID-19 vaccine side effects (Grade 2 - 03/08/2020): headache, sore throat, stomach problems  [name] BSN, RN, [place] 05/18/2020 11:37 AM

## 2020-05-23 ENCOUNTER — Other Ambulatory Visit: Payer: Self-pay | Admitting: Hematology and Oncology

## 2020-05-23 DIAGNOSIS — C9001 Multiple myeloma in remission: Secondary | ICD-10-CM

## 2020-06-02 ENCOUNTER — Other Ambulatory Visit: Payer: Self-pay | Admitting: Hematology and Oncology

## 2020-06-02 DIAGNOSIS — C9001 Multiple myeloma in remission: Secondary | ICD-10-CM

## 2020-06-05 NOTE — Telephone Encounter (Signed)
Pls refill electronically °

## 2020-06-12 ENCOUNTER — Inpatient Hospital Stay: Payer: Medicare Other | Admitting: *Deleted

## 2020-06-12 ENCOUNTER — Inpatient Hospital Stay: Payer: Medicare Other | Attending: Hematology and Oncology

## 2020-06-12 ENCOUNTER — Other Ambulatory Visit: Payer: Self-pay

## 2020-06-12 DIAGNOSIS — C9001 Multiple myeloma in remission: Secondary | ICD-10-CM | POA: Diagnosis present

## 2020-06-12 DIAGNOSIS — Z006 Encounter for examination for normal comparison and control in clinical research program: Secondary | ICD-10-CM | POA: Insufficient documentation

## 2020-06-12 LAB — CBC WITH DIFFERENTIAL (CANCER CENTER ONLY)
Abs Immature Granulocytes: 0.01 10*3/uL (ref 0.00–0.07)
Basophils Absolute: 0 10*3/uL (ref 0.0–0.1)
Basophils Relative: 1 %
Eosinophils Absolute: 0.1 10*3/uL (ref 0.0–0.5)
Eosinophils Relative: 2 %
HCT: 37.5 % (ref 36.0–46.0)
Hemoglobin: 11.7 g/dL — ABNORMAL LOW (ref 12.0–15.0)
Immature Granulocytes: 0 %
Lymphocytes Relative: 54 %
Lymphs Abs: 2.1 10*3/uL (ref 0.7–4.0)
MCH: 22.9 pg — ABNORMAL LOW (ref 26.0–34.0)
MCHC: 31.2 g/dL (ref 30.0–36.0)
MCV: 73.2 fL — ABNORMAL LOW (ref 80.0–100.0)
Monocytes Absolute: 0.5 10*3/uL (ref 0.1–1.0)
Monocytes Relative: 13 %
Neutro Abs: 1.2 10*3/uL — ABNORMAL LOW (ref 1.7–7.7)
Neutrophils Relative %: 30 %
Platelet Count: 194 10*3/uL (ref 150–400)
RBC: 5.12 MIL/uL — ABNORMAL HIGH (ref 3.87–5.11)
RDW: 15.1 % (ref 11.5–15.5)
WBC Count: 3.9 10*3/uL — ABNORMAL LOW (ref 4.0–10.5)
nRBC: 0 % (ref 0.0–0.2)

## 2020-06-12 MED ORDER — LENALIDOMIDE 5 MG PO CAPS CTSU E1A11: ORAL_CAPSULE | ORAL | 0 refills | Status: DC

## 2020-06-12 NOTE — Research (Signed)
06/12/2020  Research - [facility] ECOG-ACRIN [id number] Maintenance Cycle 71  Patient into clinic unaccompanied today for evaluation prior to beginning treatment Cycle 71.   Maintenance therapy with Revlimid:  Patient returned completed cycle 70 Medication Calendar, confirming dosing at dose level -3, Revlimid 5mg  every other day, Days 1-21. Patient notes that no doses were missed. Patient given printed appointment calendar with Cycle 71 treatment days marked, to document doses for the next treatment cycle, scheduled to begin today.   Adverse Events: Patient reports ongoing fatigue, not limiting self-care (grade 2). She reports taking a sleeping pill on two occasions over the past month, and describes having moderate difficulty sleeping (grade 2). 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 Patient also notes the usual complaints of moderate back ache daily, occasional bone pain and a few instances of leg cramps at night. Patient states she has had a few episodes of diarrhea, maximum 3 stools/24 hours, which she feels is related to vegetable intake. She has also experienced a few migraine headaches. She reports exercising regularly by riding the exercise bike for 50 minutes.   Spoke with patient by phone after CBC results were received, showing ANC of 1200 with other values within parameters for retreatment. Confirmed with patient that lab results are acceptable for treatment to begin today.  [name] BSN, RN, CCRP 06/12/2020 8:35 AM    Adverse Event Log Study/Protocol: [facility] ECOG E1A11 Maintenance Cycle 70: 05/15/2020 - 06/12/2020 (end of Cycle = 9/74/1638) Solicited &/or Reportable Events Grade Comments  Anemia Grade 1    Hyperglycemia n/a Not assessed this cycle  Lymphocyte count decreased Grade 0   Neutrophil count decreased Grade 2     Platelet count decreased Grade 0     Diarrhea Grade 1 Maximum of 3 stools/24hr  Dyspnea Grade 0   Edema: limbs Grade 0   Fatigue   Grade 2  Moderate, occasionally limiting ADLs (unchanged from previous)  Fever Grade 0   Insomnia  Grade 2  Moderate difficulty, used sleeping pill x 2  Irritability Grade 0   Muscle weakness trunk Grade 0   Nausea Grade 0   Peripheral motor neuropathy Grade 0   Peripheral sensory neuropathy  Grade 2 Moderate symptoms, unchanged from previous  Rash acneiform Grade 0   Vomiting Grade 0   Non-reportable AEs - unsolicited, < Grade 3 (Grade): - Leukopenia (1) - Headache, migraine (2) - Leg cramps (2) - Moderate pain (2)  Back pain  Bone pain [name] BSN, RN, [place] 06/12/2020 10:24 AM

## 2020-07-06 ENCOUNTER — Telehealth: Payer: Self-pay | Admitting: *Deleted

## 2020-07-06 ENCOUNTER — Other Ambulatory Visit: Payer: Self-pay

## 2020-07-06 DIAGNOSIS — C9001 Multiple myeloma in remission: Secondary | ICD-10-CM

## 2020-07-06 MED ORDER — LENALIDOMIDE 5 MG PO CAPS CTSU E1A11: ORAL_CAPSULE | ORAL | 0 refills | Status: DC

## 2020-07-06 NOTE — Telephone Encounter (Signed)
Called and told her Rx faxed to pharmacy. She verbalized understanding.

## 2020-07-06 NOTE — Telephone Encounter (Signed)
Hassan Rowan, pls refill electronically

## 2020-07-06 NOTE — Telephone Encounter (Signed)
Call from patient stating she needs a refill for her Revlimid as she is due to start next cycle on Tuesday 07/10/20.  She called the pharmacy ([facility]) and they state they are waiting for the refill from MD office. Informed patient I will give message to Dr. Alvy Bimler and her nurse for the refill.  Foye Spurling, BSN, RN Clinical Research Nurse 07/06/2020 9:31 AM

## 2020-07-09 ENCOUNTER — Other Ambulatory Visit: Payer: Self-pay | Admitting: Hematology and Oncology

## 2020-07-10 ENCOUNTER — Inpatient Hospital Stay: Payer: Medicare Other | Attending: Hematology and Oncology

## 2020-07-10 ENCOUNTER — Other Ambulatory Visit: Payer: Self-pay

## 2020-07-10 ENCOUNTER — Inpatient Hospital Stay: Payer: Medicare Other | Admitting: *Deleted

## 2020-07-10 DIAGNOSIS — Z006 Encounter for examination for normal comparison and control in clinical research program: Secondary | ICD-10-CM | POA: Insufficient documentation

## 2020-07-10 DIAGNOSIS — C9001 Multiple myeloma in remission: Secondary | ICD-10-CM

## 2020-07-10 DIAGNOSIS — Z79899 Other long term (current) drug therapy: Secondary | ICD-10-CM | POA: Insufficient documentation

## 2020-07-10 LAB — CBC WITH DIFFERENTIAL (CANCER CENTER ONLY)
Abs Immature Granulocytes: 0 10*3/uL (ref 0.00–0.07)
Basophils Absolute: 0 10*3/uL (ref 0.0–0.1)
Basophils Relative: 1 %
Eosinophils Absolute: 0.1 10*3/uL (ref 0.0–0.5)
Eosinophils Relative: 1 %
HCT: 38.5 % (ref 36.0–46.0)
Hemoglobin: 11.9 g/dL — ABNORMAL LOW (ref 12.0–15.0)
Immature Granulocytes: 0 %
Lymphocytes Relative: 52 %
Lymphs Abs: 1.9 10*3/uL (ref 0.7–4.0)
MCH: 22.7 pg — ABNORMAL LOW (ref 26.0–34.0)
MCHC: 30.9 g/dL (ref 30.0–36.0)
MCV: 73.5 fL — ABNORMAL LOW (ref 80.0–100.0)
Monocytes Absolute: 0.5 10*3/uL (ref 0.1–1.0)
Monocytes Relative: 14 %
Neutro Abs: 1.2 10*3/uL — ABNORMAL LOW (ref 1.7–7.7)
Neutrophils Relative %: 32 %
Platelet Count: 205 10*3/uL (ref 150–400)
RBC: 5.24 MIL/uL — ABNORMAL HIGH (ref 3.87–5.11)
RDW: 15 % (ref 11.5–15.5)
WBC Count: 3.6 10*3/uL — ABNORMAL LOW (ref 4.0–10.5)
nRBC: 0 % (ref 0.0–0.2)

## 2020-07-10 MED ORDER — LENALIDOMIDE 5 MG PO CAPS CTSU E1A11: ORAL_CAPSULE | ORAL | 0 refills | Status: DC

## 2020-07-10 NOTE — Research (Signed)
07/10/2020  Research - [facility] ECOG-ACRIN [id number] Maintenance Cycle 72  Patient into clinic unaccompanied today for evaluation prior to beginning treatment Cycle 72.   Maintenance therapy with Revlimid:  Patient returned completed cycle 71 Medication Calendar, confirming dosing at dose level -3, Revlimid 5mg  every other day, Days 1-21 with no missed doses. Patient given printed appointment calendar with Cycle 72 treatment days marked, to document doses for the next treatment cycle, scheduled to begin today.   Adverse Events: Patient reports ongoing fatigue, not limiting self-care (grade 2). She reports increased use of trazodone with ongoing moderate difficulty sleeping (grade 2). 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 She notes a couple of days with diarrhea, max 3 stools/24 hours (grade 1). Patient also notes  moderate back ache most days, occasional bone pain and a few instances of leg cramps (muscle pain) at night. She reports experiencing a mild day-long headache (grade 1) on 06/21/20. She continues to exercise regularly.   Annual assessments at the end of Cycle 72: Patient has been scheduled for skeletal survey following lab appointment on 07/31/2020. Patient was given items for collecting 24-hour urine sample to be returned at her lab appointment on 07/31/2020.  Patient was informed that Foye Spurling will be taking over as her research nurse; any needs prior to 07/23/2020 should be directed to Doreatha Martin, Geophysicist/field seismologist at [phone].  Spoke with patient by phone after CBC results were received, showing ANC of 1200 with other values within parameters for re-treatment. She will begin Revlimid today as discussed.  [name] BSN, RN, [place] 07/10/2020 8:36 AM   Adverse Event Log Study/Protocol: [facility] ECOG E1A11 Maintenance Cycle 71: 06/12/2020 - 07/10/2020 (end of Cycle = 4/0/9735) Solicited &/or Reportable Events Grade Comments  Anemia Grade 1     Hyperglycemia n/a Not assessed this cycle  Lymphocyte count decreased Grade 0   Neutrophil count decreased Grade 2     Platelet count decreased Grade 0     Diarrhea Grade 1 Maximum of 3 stools/24hr for a couple of days  Dyspnea Grade 0   Edema: limbs Grade 0   Fatigue  Grade 2  Moderate, occasionally limiting ADLs (unchanged from previous)  Fever Grade 0   Insomnia  Grade 2  Moderate difficulty, increased use of sleeping pill  Irritability Grade 0   Muscle weakness trunk Grade 0   Nausea Grade 0   Peripheral motor neuropathy Grade 0   Peripheral sensory neuropathy  Grade 2 Moderate symptoms, unchanged from previous  Rash acneiform Grade 0   Vomiting Grade 0   Non-reportable AEs - unsolicited, < Grade 3 (Grade): - Leukopenia (1) - Headache (1) - Leg cramps (2) - Moderate pain (2)  Back pain  Bone pain [name] BSN, RN, CCRP 07/10/2020 11:11 AM

## 2020-07-18 ENCOUNTER — Other Ambulatory Visit: Payer: Self-pay

## 2020-07-18 DIAGNOSIS — C9001 Multiple myeloma in remission: Secondary | ICD-10-CM

## 2020-07-18 MED ORDER — LENALIDOMIDE 5 MG PO CAPS CTSU E1A11: ORAL_CAPSULE | ORAL | 0 refills | Status: DC

## 2020-07-27 ENCOUNTER — Telehealth: Payer: Self-pay | Admitting: *Deleted

## 2020-07-27 NOTE — Telephone Encounter (Signed)
Called and spoke to patient to remind her to return 24-hour urine sample at 07/31/2020 lab appointment. [facility]  07/27/20

## 2020-07-30 ENCOUNTER — Telehealth: Payer: Self-pay | Admitting: *Deleted

## 2020-07-30 NOTE — Telephone Encounter (Signed)
[id number] Study; Called patient to confirm her lab appt and bone survey tomorrow.  Patient confirmed and states she started collecting her 24 hr urine this morning.  Informed patient research nurse will plan to see her next week with her MD appointment.  We will add another lab appointment for next week if her [place] is low tomorrow and we need to recheck before starting next cycle. Patient verbalized understanding. She reports she took her last Revlimid yesterday and will need refill for next week. Foye Spurling, BSN, RN Clinical Research Nurse 07/30/2020 10:35 AM

## 2020-07-31 ENCOUNTER — Inpatient Hospital Stay: Payer: Medicare Other

## 2020-07-31 ENCOUNTER — Other Ambulatory Visit: Payer: Self-pay

## 2020-07-31 ENCOUNTER — Ambulatory Visit (HOSPITAL_COMMUNITY)
Admission: RE | Admit: 2020-07-31 | Discharge: 2020-07-31 | Disposition: A | Discharge status: Home / Self Care | Payer: Medicare Other | Source: Ambulatory Visit | Attending: Hematology and Oncology | Admitting: Hematology and Oncology

## 2020-07-31 DIAGNOSIS — C9001 Multiple myeloma in remission: Secondary | ICD-10-CM | POA: Insufficient documentation

## 2020-07-31 DIAGNOSIS — Z006 Encounter for examination for normal comparison and control in clinical research program: Secondary | ICD-10-CM | POA: Diagnosis not present

## 2020-07-31 LAB — CMP (CANCER CENTER ONLY)
ALT: 18 U/L (ref 0–44)
AST: 18 U/L (ref 15–41)
Albumin: 3.6 g/dL (ref 3.5–5.0)
Alkaline Phosphatase: 50 U/L (ref 38–126)
Anion gap: 7 (ref 5–15)
BUN: 11 mg/dL (ref 8–23)
CO2: 26 mmol/L (ref 22–32)
Calcium: 9.8 mg/dL (ref 8.9–10.3)
Chloride: 106 mmol/L (ref 98–111)
Creatinine: 0.82 mg/dL (ref 0.44–1.00)
GFR, Est AFR Am: >60 mL/min (ref >60)
GFR, Estimated: >60 mL/min (ref >60)
Glucose, Bld: 86 mg/dL (ref 70–99)
Potassium: 3.7 mmol/L (ref 3.5–5.1)
Sodium: 139 mmol/L (ref 135–145)
Total Bilirubin: 0.9 mg/dL (ref 0.3–1.2)
Total Protein: 7.4 g/dL (ref 6.5–8.1)

## 2020-07-31 LAB — CBC WITH DIFFERENTIAL (CANCER CENTER ONLY)
Abs Immature Granulocytes: 0.01 10*3/uL (ref 0.00–0.07)
Basophils Absolute: 0 10*3/uL (ref 0.0–0.1)
Basophils Relative: 1 %
Eosinophils Absolute: 0.1 10*3/uL (ref 0.0–0.5)
Eosinophils Relative: 3 %
HCT: 37.8 % (ref 36.0–46.0)
Hemoglobin: 12 g/dL (ref 12.0–15.0)
Immature Granulocytes: 0 %
Lymphocytes Relative: 48 %
Lymphs Abs: 1.6 10*3/uL (ref 0.7–4.0)
MCH: 23.3 pg — ABNORMAL LOW (ref 26.0–34.0)
MCHC: 31.7 g/dL (ref 30.0–36.0)
MCV: 73.5 fL — ABNORMAL LOW (ref 80.0–100.0)
Monocytes Absolute: 0.4 10*3/uL (ref 0.1–1.0)
Monocytes Relative: 13 %
Neutro Abs: 1.2 10*3/uL — ABNORMAL LOW (ref 1.7–7.7)
Neutrophils Relative %: 35 %
Platelet Count: 223 10*3/uL (ref 150–400)
RBC: 5.14 MIL/uL — ABNORMAL HIGH (ref 3.87–5.11)
RDW: 15 % (ref 11.5–15.5)
WBC Count: 3.4 10*3/uL — ABNORMAL LOW (ref 4.0–10.5)
nRBC: 0 % (ref 0.0–0.2)

## 2020-07-31 LAB — LACTATE DEHYDROGENASE: LDH: 180 U/L (ref 98–192)

## 2020-07-31 LAB — TSH: TSH: 1.935 u[IU]/mL (ref 0.308–3.960)

## 2020-08-01 ENCOUNTER — Other Ambulatory Visit: Payer: Self-pay | Admitting: *Deleted

## 2020-08-01 DIAGNOSIS — C9001 Multiple myeloma in remission: Secondary | ICD-10-CM

## 2020-08-01 LAB — KAPPA/LAMBDA LIGHT CHAINS
Kappa free light chain: 28.1 mg/L — ABNORMAL HIGH (ref 3.3–19.4)
Kappa, lambda light chain ratio: 1.4 (ref 0.26–1.65)
Lambda free light chains: 20.1 mg/L (ref 5.7–26.3)

## 2020-08-02 ENCOUNTER — Other Ambulatory Visit: Payer: Self-pay

## 2020-08-02 LAB — MULTIPLE MYELOMA PANEL, SERUM
Albumin SerPl Elph-Mcnc: 3.5 g/dL (ref 2.9–4.4)
Albumin/Glob SerPl: 1.1 (ref 0.7–1.7)
Alpha 1: 0.2 g/dL (ref 0.0–0.4)
Alpha2 Glob SerPl Elph-Mcnc: 0.6 g/dL (ref 0.4–1.0)
B-Globulin SerPl Elph-Mcnc: 1 g/dL (ref 0.7–1.3)
Gamma Glob SerPl Elph-Mcnc: 1.7 g/dL (ref 0.4–1.8)
Globulin, Total: 3.4 g/dL (ref 2.2–3.9)
IgA: 127 mg/dL (ref 87–352)
IgG (Immunoglobin G), Serum: 1782 mg/dL — ABNORMAL HIGH (ref 586–1602)
IgM (Immunoglobulin M), Srm: 69 mg/dL (ref 26–217)
Total Protein ELP: 6.9 g/dL (ref 6.0–8.5)

## 2020-08-02 LAB — UPEP/TP, 24-HR URINE
Albumin, U: 0 %
Alpha 1, Urine: 0 %
Alpha 2, Urine: 0 %
Beta, Urine: 0 %
Gamma Globulin, Urine: 0 %
Total Protein, Urine-Ur/day: 90 mg/24 hr (ref 30–150)
Total Protein, Urine: 4.3 mg/dL

## 2020-08-02 LAB — UIFE/LIGHT CHAINS/TP QN, 24-HR UR
FR KAPPA LT CH,24HR: 23.56 mg/24 hr
FR LAMBDA LT CH,24HR: 2.21 mg/24 hr
Free Kappa Lt Chains,Ur: 11.22 mg/L (ref 0.63–113.79)
Free Kappa/Lambda Ratio: 10.69 (ref 1.03–31.76)
Free Lambda Lt Chains,Ur: 1.05 mg/L (ref 0.47–11.77)
Total Protein, Urine-Ur/day: <84 mg/24 hr (ref 30–150)
Total Protein, Urine: <4 mg/dL

## 2020-08-02 NOTE — Telephone Encounter (Signed)
Spoke with pt and explained Revlimid had been approved 8/18 with auth. [facility] called and will call pt to let her know about delivery.

## 2020-08-07 ENCOUNTER — Inpatient Hospital Stay: Payer: Medicare Other | Attending: Hematology and Oncology | Admitting: Hematology and Oncology

## 2020-08-07 ENCOUNTER — Telehealth: Payer: Self-pay | Admitting: Hematology and Oncology

## 2020-08-07 ENCOUNTER — Encounter: Payer: Self-pay | Admitting: Hematology and Oncology

## 2020-08-07 ENCOUNTER — Other Ambulatory Visit: Payer: Self-pay

## 2020-08-07 ENCOUNTER — Inpatient Hospital Stay: Payer: Medicare Other | Admitting: *Deleted

## 2020-08-07 ENCOUNTER — Inpatient Hospital Stay: Payer: Medicare Other

## 2020-08-07 ENCOUNTER — Encounter: Payer: Medicare Other | Admitting: *Deleted

## 2020-08-07 DIAGNOSIS — Z006 Encounter for examination for normal comparison and control in clinical research program: Secondary | ICD-10-CM | POA: Insufficient documentation

## 2020-08-07 DIAGNOSIS — C9001 Multiple myeloma in remission: Secondary | ICD-10-CM | POA: Diagnosis not present

## 2020-08-07 DIAGNOSIS — D61811 Other drug-induced pancytopenia: Secondary | ICD-10-CM | POA: Insufficient documentation

## 2020-08-07 DIAGNOSIS — Z23 Encounter for immunization: Secondary | ICD-10-CM

## 2020-08-07 DIAGNOSIS — R197 Diarrhea, unspecified: Secondary | ICD-10-CM | POA: Diagnosis not present

## 2020-08-07 DIAGNOSIS — Z299 Encounter for prophylactic measures, unspecified: Secondary | ICD-10-CM | POA: Insufficient documentation

## 2020-08-07 MED ORDER — LENALIDOMIDE 5 MG PO CAPS CTSU E1A11: ORAL_CAPSULE | ORAL | 0 refills | Status: DC

## 2020-08-07 NOTE — Progress Notes (Signed)
[facility] OFFICE PROGRESS NOTE  Patient Care Team: Wenda Low, MD as PCP - General (Internal Medicine) Heath Lark, MD as Consulting Physician (Hematology and Oncology)  ASSESSMENT & PLAN:  Multiple myeloma in remission [facility]) Her recent myeloma panel showed that she is in remission The patient is comfortable to remain on Revlimid indefinitely. She will continue treatment per research protocol She will continue calcium with vitamin D supplement and aspirin for DVT prophylaxis  Drug-induced pancytopenia ([place]) This is likely due to recent treatment. The patient denies recent history of fevers, cough, chills, diarrhea or dysuria. She is asymptomatic from the leukopenia. I will observe for now.  I will continue the chemotherapy at current dose without dosage adjustment.  If the leukopenia gets progressive worse in the future, I might have to delay her treatment or adjust the chemotherapy dose per protocol   Preventive measure We discussed the importance of preventive care and reviewed the vaccination programs. She does not have any prior allergic reactions to Covid-19 vaccination. She agrees to proceed with booster dose of Covid-19 vaccination today and we will administer it today at the clinic.    No orders of the defined types were placed in this encounter.   All questions were answered. The patient knows to call the clinic with any problems, questions or concerns. The total time spent in the appointment was 20 minutes encounter with patients including review of chart and various tests results, discussions about plan of care and coordination of care plan   Heath Lark, MD 08/07/2020 9:09 AM  INTERVAL HISTORY: Please see below for problem oriented charting. She returns for myeloma follow-up She is doing well No recent bone pain She continues to have intermittent diarrhea No recent infection, fever or chills She is up-to-date with vaccination  SUMMARY OF ONCOLOGIC  HISTORY: Oncology History Overview Note  ISS stage 1 IgG lambda subtype (serum albumin 3.6, Beta2 microglobulin 2.32) Durie Salmon Stage 1   Multiple myeloma in remission ([place])  10/10/2013 Imaging   Skeletal survery was negative   11/09/2013 Bone Marrow Biopsy   BM biopsy confirmed myeloma, 76% involved, IgG lambda subtype   12/06/2013 - 08/29/2014 Chemotherapy   Sh received chemo with revlimid, Velcade, Dexamethasone and Zometa. 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   05/05/2014 Imaging   Imaging study of the neck showed no explanation that could cause right neck pain. She is noted to have incidental left upper lung nodule. Plan to repeat imaging study in 3 months.   09/06/2014 Imaging   Bone survey showed no evidence of fracture   09/14/2014 Bone Marrow Biopsy   Bone marrow biopsy showed 8% residual plasma cells by manual count but none on the biopsy specimen   09/26/2014 -  Chemotherapy   She is started on cycle 1 of maintenance Revlimid   09/26/2014 -  Chemotherapy   The patient had lenalidomide (REVLIMID) 15 MG capsule, 3 of 3 cycles lenalidomide (REVLIMID) 10 MG capsule, 1 of 1 cycle, Start date: 01/18/2015, End date: 02/07/2015 lenalidomide (REVLIMID) 5 MG capsule, 68 of 70 cycles  for chemotherapy treatment.    01/31/2015 Imaging    chest x-ray showed pneumonia. Treatment was placed on hold.   05/03/2015 Bone Marrow Biopsy   Accession: [id number] repeat bone marrow aspirate and biopsy show 5% residual plasma  cells   10/14/2016 Bone Marrow Biopsy   Bone marrow biopsy showed the plasma cells represent 4% of all cells with lack of large aggregates or sheets. To assess the plasma cell clonality, immunohistochemical stains is performed and it lack clonality. Normal cytogenetics and FISH   01/09/2017  Imaging   CT chest showed ground-glass 1.5 cm apical left upper lobe pulmonary nodule. Initial follow-up with CT at 6-12 months is recommended to confirm persistence. If persistent, repeat CT is recommended every 2 years until 5 years of stability has been established. This recommendation follows the consensus statement: Guidelines for Management of Incidental Pulmonary Nodules Detected on CT Images: From the Fleischner Society 2017; Radiology 2017; 284:228-243. 2. Mild patchy ground-glass opacities in the right upper lobe, probably inflammatory, which can also be reassessed on follow-up chest CT performed for the above dominant ground-glass nodule. 3. Solitary 3 mm right lower lobe solid pulmonary nodule, which can also be reassessed on follow-up chest CT performed for the above dominant ground-glass nodule. 4. No thoracic adenopathy. 5. Aortic atherosclerosis. Two-vessel coronary atherosclerosis.   06/30/2017 Imaging   1. No interval change in the 11 x 13 mm ground-glass nodule left upper lobe. Given the nearly 6 months of imaging stability, repeat CT is recommended every 2 years until 5 years of stability has been established. This recommendation follows the consensus statement: Guidelines for Management of Incidental Pulmonary Nodules Detected on CT Images: From the Fleischner Society 2017; Radiology 2017; 284:228-243. 2. Interval resolution of the tiny patchy ground-glass nodules right upper lobe, likely infectious/inflammatory etiology. 3. Stable 3 mm right lower lobe pulmonary nodule. Attention on follow-up recommended. 4. Aortic Atherosclerois (ICD10-170.0)   09/29/2017 Imaging   Skeletal survey 1. Questionable new tiny lucency noted the posterior portion of C3 vertebral body. This may represent a small lytic lesion.  2. No definite thoracic lesion noted on today's exam. Stable lucencies in the left ilium and acetabulum.  3. No other focal abnormalities identified. The left hip  is unremarkable .     REVIEW OF SYSTEMS:   Constitutional: Denies fevers, chills or abnormal weight loss Eyes: Denies blurriness of vision Ears, nose, mouth, throat, and face: Denies mucositis or sore throat Respiratory: Denies cough, dyspnea or wheezes Cardiovascular: Denies palpitation, chest discomfort or lower extremity swelling Skin: Denies abnormal skin rashes Lymphatics: Denies new lymphadenopathy or easy bruising Neurological:Denies numbness, tingling or new weaknesses Behavioral/Psych: Mood is stable, no new changes  All other systems were reviewed with the patient and are negative.  I have reviewed the past medical history, past surgical history, social history and family history with the patient and they are unchanged from previous note.  ALLERGIES:  has No Known Allergies.  MEDICATIONS:  Current Outpatient Medications  Medication Sig Dispense Refill  . aspirin 325 MG tablet Take 325 mg by mouth daily.     Marland Kitchen CANNABIDIOL PO Take 500 mg by mouth as needed.    . Cholecalciferol (VITAMIN D3) 2000 UNITS TABS Take 2,000 Units by mouth daily.    . hydrochlorothiazide (HYDRODIURIL) 25 MG tablet Take 25 mg by mouth every evening.     Marland Kitchen lenalidomide (REVLIMID) 5 MG capsule Take 1 capsule every other day for 21 days, off 7 days. Repeat every 28 days. 11 capsule 0  . levothyroxine (SYNTHROID, LEVOTHROID) 75 MCG tablet Take 75 mcg by mouth daily before breakfast.    . methocarbamol (ROBAXIN) 500 MG tablet Take 1 tablet (500 mg total) by mouth every 8 (eight) hours as needed for  muscle spasms. 90 tablet 1  . Multiple Vitamins-Minerals (CENTRUM SILVER PO) Take 1 tablet by mouth daily.     . polyethylene glycol (MIRALAX / GLYCOLAX) packet Take 17 g by mouth daily as needed for mild constipation.     . RESTASIS 0.05 % ophthalmic emulsion PLACE 1 DROP IN BOTH EYES IN THE EVENING  4  . sitaGLIPtin (JANUVIA) 50 MG tablet Take 50 mg by mouth daily.    . traZODone (DESYREL) 50 MG tablet TAKE  1 TABLET (50 MG TOTAL) BY MOUTH AT BEDTIME. 90 tablet 3  . venlafaxine XR (EFFEXOR-XR) 150 MG 24 hr capsule Take 1 capsule (150 mg total) by mouth daily with breakfast. 90 capsule 3  . verapamil (VERELAN PM) 120 MG 24 hr capsule Take 1 capsule (120 mg total) by mouth daily. 90 capsule 3   No current facility-administered medications for this visit.   Facility-Administered Medications Ordered in Other Visits  Medication Dose Route Frequency Provider Last Rate Last Admin  . gadopentetate dimeglumine (MAGNEVIST) injection 15 mL  15 mL Intravenous Once PRN Melvenia Beam, MD        PHYSICAL EXAMINATION: ECOG PERFORMANCE STATUS: 1 - Symptomatic but completely ambulatory  Vitals:   08/07/20 0803  BP: 134/81  Pulse: 65  Resp: 17  Temp: (!) 96.8 F (36 C)  SpO2: 100%   Filed Weights   08/07/20 0803  Weight: 164 lb 6.4 oz (74.6 kg)    GENERAL:alert, no distress and comfortable SKIN: skin color, texture, turgor are normal, no rashes or significant lesions EYES: normal, Conjunctiva are pink and non-injected, sclera clear OROPHARYNX:no exudate, no erythema and lips, buccal mucosa, and tongue normal  NECK: supple, thyroid normal size, non-tender, without nodularity LYMPH:  no palpable lymphadenopathy in the cervical, axillary or inguinal LUNGS: clear to auscultation and percussion with normal breathing effort HEART: regular rate & rhythm and no murmurs and no lower extremity edema ABDOMEN:abdomen soft, non-tender and normal bowel sounds Musculoskeletal:no cyanosis of digits and no clubbing  NEURO: alert & oriented x 3 with fluent speech, no focal motor/sensory deficits  LABORATORY DATA:  I have reviewed the data as listed    Component Value Date/Time   NA 139 07/31/2020 0737   NA 141 09/29/2017 0830   K 3.7 07/31/2020 0737   K 3.6 09/29/2017 0830   CL 106 07/31/2020 0737   CO2 26 07/31/2020 0737   CO2 29 09/29/2017 0830   GLUCOSE 86 07/31/2020 0737   GLUCOSE 87 09/29/2017  0830   BUN 11 07/31/2020 0737   BUN 14.0 09/29/2017 0830   CREATININE 0.82 07/31/2020 0737   CREATININE 0.9 09/29/2017 0830   CALCIUM 9.8 07/31/2020 0737   CALCIUM 9.6 09/29/2017 0830   PROT 7.4 07/31/2020 0737   PROT 7.3 09/29/2017 0832   PROT 8.0 09/29/2017 0830   ALBUMIN 3.6 07/31/2020 0737   ALBUMIN 3.8 09/29/2017 0830   AST 18 07/31/2020 0737   AST 21 09/29/2017 0830   ALT 18 07/31/2020 0737   ALT 69 (H) 09/29/2017 0830   ALKPHOS 50 07/31/2020 0737   ALKPHOS 60 09/29/2017 0830   BILITOT 0.9 07/31/2020 0737   BILITOT 0.46 09/29/2017 0830   GFRNONAA >60 07/31/2020 0737   GFRAA >60 07/31/2020 0737    No results found for: SPEP, UPEP  Lab Results  Component Value Date   WBC 3.4 (L) 07/31/2020   NEUTROABS 1.2 (L) 07/31/2020   HGB 12.0 07/31/2020   HCT 37.8 07/31/2020   MCV 73.5 (L)  07/31/2020   PLT 223 07/31/2020      Chemistry      Component Value Date/Time   NA 139 07/31/2020 0737   NA 141 09/29/2017 0830   K 3.7 07/31/2020 0737   K 3.6 09/29/2017 0830   CL 106 07/31/2020 0737   CO2 26 07/31/2020 0737   CO2 29 09/29/2017 0830   BUN 11 07/31/2020 0737   BUN 14.0 09/29/2017 0830   CREATININE 0.82 07/31/2020 0737   CREATININE 0.9 09/29/2017 0830      Component Value Date/Time   CALCIUM 9.8 07/31/2020 0737   CALCIUM 9.6 09/29/2017 0830   ALKPHOS 50 07/31/2020 0737   ALKPHOS 60 09/29/2017 0830   AST 18 07/31/2020 0737   AST 21 09/29/2017 0830   ALT 18 07/31/2020 0737   ALT 69 (H) 09/29/2017 0830   BILITOT 0.9 07/31/2020 0737   BILITOT 0.46 09/29/2017 0830       RADIOGRAPHIC STUDIES: I have personally reviewed the radiological images as listed and agreed with the findings in the report. DG Bone Survey Met  Result Date: 07/31/2020 CLINICAL DATA:  Multiple myeloma, back pain EXAM: METASTATIC BONE SURVEY COMPARISON:  08/23/2019 FINDINGS: No focal lytic or blastic bone lesions are identified. Moderate degenerative disc disease noted at C5-6 and C6-7.  Changes of mild degenerative disc disease noted within the midthoracic spine. Mild to moderate degenerative disc disease noted at L4-5 and, to a greater extent, L5-S1. IMPRESSION: No focal lytic or blastic bone lesions are identified. Electronically Signed   By: Fidela Salisbury MD   On: 07/31/2020 19:41

## 2020-08-07 NOTE — Assessment & Plan Note (Signed)
This is likely due to recent treatment. The patient denies recent history of fevers, cough, chills, diarrhea or dysuria. She is asymptomatic from the leukopenia. I will observe for now.  I will continue the chemotherapy at current dose without dosage adjustment.  If the leukopenia gets progressive worse in the future, I might have to delay her treatment or adjust the chemotherapy dose per protocol

## 2020-08-07 NOTE — Assessment & Plan Note (Signed)
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 She will continue treatment per research protocol She will continue calcium with vitamin D supplement and aspirin for DVT prophylaxis 

## 2020-08-07 NOTE — Research (Signed)
Research - [facility] ECOG-ACRIN [id number] Maintenance Cycle 73, Day 1  Patient into clinic unaccompanied today for evaluation prior to beginning treatment Cycle 73.   Labs w/myeloma panel: Obtained on 07/31/2020 within 7-day window per protocol, to allow adequate time for results prior to treatment. Results reviewed by Dr. Alvy Bimler and found to be within parameters for continued dosing at Maintenance phase Dose Level -3, Revlimid 5 mg every other day for 21 days, every 28 days. Review of myeloma panel revealed evidence of Complete Response. Per MD, bone marrow biopsy and aspirate would not be considered standard of care at this time.   History and physical exam: See MD note dated today.     Adverse Events:  Patient reports ongoing fatigue, not limiting self-care (grade 2). She reports use of trazodone prn with ongoing moderate difficulty sleeping (grade 2).  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 Patient had 6 days with diarrhea (max 4 stools in 24 hours) which she is not sure of the cause. 4 of the days were on her week off of Revlimid. She did not take any anti diarrheal medication for this.  Patient also notes either moderate back ache or bone pain almost every day and shoulder pain one day requiring Robaxin.  She continues to exercise regularly. Patient reports new diagnosis of glaucoma and started on Dorzolamide-Timolol eye drops 07/17/20. Patient denies any vision changes.   Maintenance therapy with Revlimid: Based on lab results review and history and physical by Dr. Alvy Bimler, patient meets criteria for continued treatment with Revlimid, which she is in agreement to do. Patient is aware that treatment will continue indefinitely, unless there is evidence of disease recurrence, adverse effects requiring treatment discontinuation, or patient/physician decision to discontinue therapy. Patient returned completed cycle 72 Medication Calendar, confirming dosing at dose level -3,  Revlimid 22m every other day, Days 1-21. Patient notes that no doses were missed. Patient given printed appointment calendar with Cycle 73 treatment days marked, to document doses for the next treatment cycle, scheduled to begin today.  Plan: Patient will continue with monthly clinic visits on 09/04/2020 and 10/02/2020 for hematology and AE assessments prior to beginning subsequent treatment cycles. Myeloma panel and other labs, to be performed on 10/23/2020 in advance of the next MD clinic visit at the end of Cycle 75, expected to occur on 10/30/2020.  Adverse Event Log Study/Protocol: [facility] ECOG E1A11 Maintenance Cycles 70-72: 05/15/2020-08/07/2020 (end of Cycle 72 = 91/24/5809 Solicited &/or Reportable Events Grade Attribution to lenalidomide Cycle # Comments  Anemia Grade 1 Definite 70-71    Hyperglycemia Grade 0 -    Lymphocyte count decreased Grade 0 -    Neutrophil count decreased Grade 2  Definite 70-72   Platelet count decreased Grade 0 -     Diarrhea Grade 1 Possible 70-72   Dyspnea Grade 0 -    Edema: limbs Grade 0 -    Fatigue  Grade 2  Probable 70-72 Moderate, occasionally limiting ADLs (unchanged from previous)  Fever Grade 0 -    Insomnia  Grade 2   Unlikely 70-72 Trazodone PRN  Irritability Grade 0     Muscle weakness trunk Grade 0 -    Nausea Grade 0 -    Peripheral motor neuropathy Grade 0     Peripheral sensory neuropathy  Grade 2 Unlikely 70-72 Moderate symptoms, unchanged from previous  Rash acneiform Grade 0 -    Vomiting Grade 0 -     Non-reportable AEs (unsolicited, < Grade  3): - Leukopenia (grade 1) - Moderate pain (grade 2) o Back pain o Bone pain o Shoulder pain (arthralgia) - Weight loss (grade 2) - Hypertension (1) - Glaucoma (1) Cameo Bitha Fauteux, BSN, RN Clinical Research Nurse 08/07/2020

## 2020-08-07 NOTE — Assessment & Plan Note (Signed)
We discussed the importance of preventive care and reviewed the vaccination programs. She does not have any prior allergic reactions to Covid-19 vaccination. She agrees to proceed with booster dose of Covid-19 vaccination today and we will administer it today at the clinic.  

## 2020-08-07 NOTE — Progress Notes (Signed)
   Covid-19 Vaccination Clinic  Name:  Carla Perry    MRN: [id number] DOB: 08-Apr-1956  08/07/2020  Carla Perry was observed post Covid-19 immunization for 15 minutes without incident. She was provided with Vaccine Information Sheet and instruction to access the V-Safe system.   Carla Perry was instructed to call 911 with any severe reactions post vaccine: Marland Kitchen Difficulty breathing  . Swelling of face and throat  . A fast heartbeat  . A bad rash all over body  . Dizziness and weakness

## 2020-08-07 NOTE — Telephone Encounter (Signed)
Scheduled appointments per 9/7 scheduling message. Gave patient calendar print out.

## 2020-08-26 ENCOUNTER — Other Ambulatory Visit: Payer: Self-pay | Admitting: Hematology and Oncology

## 2020-08-26 DIAGNOSIS — C9001 Multiple myeloma in remission: Secondary | ICD-10-CM

## 2020-08-27 NOTE — Telephone Encounter (Signed)
Pls refill electronically °

## 2020-09-03 ENCOUNTER — Telehealth: Payer: Self-pay | Admitting: *Deleted

## 2020-09-03 NOTE — Telephone Encounter (Signed)
Called patient to confirm her lab appointment for tomorrow morning and she confirmed. Plan to meet patient prior to lab to review AEs and exchange medication diaries. Patient verbalized understanding.  Foye Spurling, BSN, RN Clinical Research Nurse 09/03/2020 5:21 PM

## 2020-09-04 ENCOUNTER — Telehealth: Payer: Self-pay | Admitting: *Deleted

## 2020-09-04 ENCOUNTER — Other Ambulatory Visit: Payer: Self-pay | Admitting: *Deleted

## 2020-09-04 ENCOUNTER — Encounter: Payer: Self-pay | Admitting: *Deleted

## 2020-09-04 ENCOUNTER — Other Ambulatory Visit: Payer: Self-pay

## 2020-09-04 ENCOUNTER — Inpatient Hospital Stay: Payer: Medicare Other | Attending: Hematology and Oncology

## 2020-09-04 DIAGNOSIS — C9001 Multiple myeloma in remission: Secondary | ICD-10-CM

## 2020-09-04 DIAGNOSIS — Z006 Encounter for examination for normal comparison and control in clinical research program: Secondary | ICD-10-CM | POA: Diagnosis not present

## 2020-09-04 LAB — CBC WITH DIFFERENTIAL (CANCER CENTER ONLY)
Abs Immature Granulocytes: 0 10*3/uL (ref 0.00–0.07)
Basophils Absolute: 0 10*3/uL (ref 0.0–0.1)
Basophils Relative: 1 %
Eosinophils Absolute: 0.1 10*3/uL (ref 0.0–0.5)
Eosinophils Relative: 2 %
HCT: 37.7 % (ref 36.0–46.0)
Hemoglobin: 11.9 g/dL — ABNORMAL LOW (ref 12.0–15.0)
Immature Granulocytes: 0 %
Lymphocytes Relative: 51 %
Lymphs Abs: 1.9 10*3/uL (ref 0.7–4.0)
MCH: 23.2 pg — ABNORMAL LOW (ref 26.0–34.0)
MCHC: 31.6 g/dL (ref 30.0–36.0)
MCV: 73.3 fL — ABNORMAL LOW (ref 80.0–100.0)
Monocytes Absolute: 0.5 10*3/uL (ref 0.1–1.0)
Monocytes Relative: 12 %
Neutro Abs: 1.3 10*3/uL — ABNORMAL LOW (ref 1.7–7.7)
Neutrophils Relative %: 34 %
Platelet Count: 207 10*3/uL (ref 150–400)
RBC: 5.14 MIL/uL — ABNORMAL HIGH (ref 3.87–5.11)
RDW: 15.2 % (ref 11.5–15.5)
WBC Count: 3.8 10*3/uL — ABNORMAL LOW (ref 4.0–10.5)
nRBC: 0 % (ref 0.0–0.2)

## 2020-09-04 MED ORDER — LENALIDOMIDE 5 MG PO CAPS CTSU E1A11: ORAL_CAPSULE | ORAL | 0 refills | Status: DC

## 2020-09-04 NOTE — Telephone Encounter (Signed)
Left VM for patient after CBC results were received, showing ANC of 1300 with other values within parameters for re-treatment. Instructed to begin Revlimid today as discussed and call back if any questions. Foye Spurling, BSN, RN Clinical Research Nurse 09/04/2020 9:29 AM

## 2020-09-04 NOTE — Research (Signed)
09/04/2020  Research - [facility] ECOG-ACRIN [id number] Maintenance Cycle 74  Patient into clinic unaccompanied today for evaluation prior to beginning treatment Cycle 74. Met with patient prior to her lab appointment.   Maintenance therapy with Revlimid:  Patient returned completed cycle 73 Medication Calendar, confirming dosing at dose level -3, Revlimid 87m every other day, Days 1-21 with no missed doses. Patient given printed appointment calendar with Cycle 74 treatment days marked, to document doses for the next treatment cycle, scheduled to begin today.   Adverse Events: Patient reports ongoing fatigue, not limiting self-care (grade 2). She reports use of trazodone twice with ongoing moderate difficulty sleeping (grade 2). 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 She denies diarrhea stating she has avoided eating the same vegetables more than one day in a row and this has seemed to help.  Patient also notes moderate back ache most days (grade 2). She has had occasional headaches and one stomach ache which she also reports as mild and did not require any medication (grade 1). She continues to exercise regularly.    Adverse Event Log Study/Protocol: [facility] ECOG E1A11 Maintenance Cycles 73: 08/07/2020- 09/04/2020 (end of cycle 136/10/2448 Solicited &/or Reportable Events Grade Comments  Anemia Grade 1    Hyperglycemia Grade 0   Lymphocyte count decreased Grade 0   Neutrophil count decreased Grade 2    Platelet count decreased Grade 0    Diarrhea Grade 0   Dyspnea Grade 0   Edema: limbs Grade 0   Fatigue  Grade 2  Moderate, occasionally limiting ADLs (unchanged from previous)  Fever Grade 0   Insomnia  Grade 2   Trazodone PRN  Irritability Grade 0   Muscle weakness trunk Grade 0   Nausea Grade 0   Peripheral motor neuropathy Grade 0   Peripheral sensory neuropathy  Grade 2 Moderate symptoms, unchanged from previous  Rash acneiform Grade 0   Vomiting Grade 0     Non-reportable AEs (unsolicited, < Grade 3): - Leukopenia (grade 1) - Moderate pain (grade 2) o Back pain - Glaucoma (1) - Headache (1) - Stomachache (1)  Plan: Informed patient research nurse will call her with CBC results and instructions regarding starting next cycle of Revlimid. Patient verbalized understanding.  CFoye Spurling BSN, RN Clinical Research Nurse 08/07/2020

## 2020-09-10 ENCOUNTER — Telehealth: Payer: Self-pay | Admitting: *Deleted

## 2020-09-10 NOTE — Telephone Encounter (Signed)
Patient states the medication diary calendar provided to her on last visit does not show Saturday or Sunday, just Monday thru Friday.  Apologized to patient and placed new copy of calendar in the mail with Saturday and Sunday showing up.  Thanked patient for calling and asked her to update the new calendar when she receives it. Patient verbalized understanding.  Foye Spurling, BSN, RN Clinical Research Nurse 09/10/2020 10:41 AM

## 2020-09-13 ENCOUNTER — Other Ambulatory Visit: Payer: Self-pay

## 2020-09-13 DIAGNOSIS — C9001 Multiple myeloma in remission: Secondary | ICD-10-CM

## 2020-09-13 MED ORDER — LENALIDOMIDE 5 MG PO CAPS CTSU E1A11: ORAL_CAPSULE | ORAL | 0 refills | Status: DC

## 2020-10-02 ENCOUNTER — Encounter: Payer: Self-pay | Admitting: Hematology and Oncology

## 2020-10-02 ENCOUNTER — Other Ambulatory Visit: Payer: Self-pay

## 2020-10-02 ENCOUNTER — Other Ambulatory Visit: Payer: Self-pay | Admitting: Hematology and Oncology

## 2020-10-02 ENCOUNTER — Encounter: Payer: Self-pay | Admitting: *Deleted

## 2020-10-02 ENCOUNTER — Other Ambulatory Visit: Payer: Self-pay | Admitting: *Deleted

## 2020-10-02 ENCOUNTER — Inpatient Hospital Stay: Payer: Medicare Other | Attending: Hematology and Oncology

## 2020-10-02 DIAGNOSIS — C9001 Multiple myeloma in remission: Secondary | ICD-10-CM

## 2020-10-02 DIAGNOSIS — Z006 Encounter for examination for normal comparison and control in clinical research program: Secondary | ICD-10-CM | POA: Insufficient documentation

## 2020-10-02 DIAGNOSIS — Z79899 Other long term (current) drug therapy: Secondary | ICD-10-CM | POA: Insufficient documentation

## 2020-10-02 DIAGNOSIS — Z9221 Personal history of antineoplastic chemotherapy: Secondary | ICD-10-CM | POA: Insufficient documentation

## 2020-10-02 DIAGNOSIS — K5909 Other constipation: Secondary | ICD-10-CM | POA: Insufficient documentation

## 2020-10-02 DIAGNOSIS — D61811 Other drug-induced pancytopenia: Secondary | ICD-10-CM | POA: Insufficient documentation

## 2020-10-02 LAB — CBC WITH DIFFERENTIAL (CANCER CENTER ONLY)
Abs Immature Granulocytes: 0.01 10*3/uL (ref 0.00–0.07)
Basophils Absolute: 0 10*3/uL (ref 0.0–0.1)
Basophils Relative: 1 %
Eosinophils Absolute: 0.1 10*3/uL (ref 0.0–0.5)
Eosinophils Relative: 3 %
HCT: 38.4 % (ref 36.0–46.0)
Hemoglobin: 12 g/dL (ref 12.0–15.0)
Immature Granulocytes: 0 %
Lymphocytes Relative: 55 %
Lymphs Abs: 2.2 10*3/uL (ref 0.7–4.0)
MCH: 23.2 pg — ABNORMAL LOW (ref 26.0–34.0)
MCHC: 31.3 g/dL (ref 30.0–36.0)
MCV: 74.1 fL — ABNORMAL LOW (ref 80.0–100.0)
Monocytes Absolute: 0.5 10*3/uL (ref 0.1–1.0)
Monocytes Relative: 12 %
Neutro Abs: 1.2 10*3/uL — ABNORMAL LOW (ref 1.7–7.7)
Neutrophils Relative %: 29 %
Platelet Count: 208 10*3/uL (ref 150–400)
RBC: 5.18 MIL/uL — ABNORMAL HIGH (ref 3.87–5.11)
RDW: 15.1 % (ref 11.5–15.5)
WBC Count: 4 10*3/uL (ref 4.0–10.5)
nRBC: 0 % (ref 0.0–0.2)

## 2020-10-02 MED ORDER — LENALIDOMIDE 5 MG PO CAPS CTSU E1A11: ORAL_CAPSULE | ORAL | 0 refills | Status: DC

## 2020-10-02 NOTE — Research (Signed)
10/02/2020  Research - [facility] ECOG-ACRIN [id number] Maintenance Cycle 75  Patient into clinic unaccompanied today for evaluation prior to beginning treatment Cycle 75. Met with patient prior to her lab appointment.   Maintenance therapy with Revlimid:  Patient returned completed cycle 74 Medication Calendar, confirming dosing at dose level -3, Revlimid 33m every other day, Days 1-21 with no missed doses. Patient given printed appointment calendar with Cycle 75 treatment days marked, to document doses for the next treatment cycle, scheduled to begin today.   Adverse Events: Patient reports ongoing fatigue, not limiting self-care (grade 2). She reports ongoing moderate difficulty sleeping (grade 2) x 2 nights. 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 She reports diarrhea (grade 1), up to 3 stools per day x 5 days this past month. Patient refers to diarrhea as "stomach problems" on her diary and she reports her baseline BMs are 1 to 2 per day normally.  Patient also notes moderate back ache most days (grade 2) for which she took oxycodone 1 day. She has had occasional headaches (grade 1) and one headache she listed as a migraine which she took tramadol (grade 2). She continues to exercise regularly.    Adverse Event Log Study/Protocol: [facility] ECOG E1A11 Maintenance Cycles 74: 09/04/2020-10/02/2020 (end of cycle 168/07/8109 Solicited &/or Reportable Events Grade Comments  Anemia Grade 0    Hyperglycemia Grade 0   Lymphocyte count decreased Grade 0   Neutrophil count decreased Grade 2    Platelet count decreased Grade 0    Diarrhea Grade 1   Dyspnea Grade 0   Edema: limbs Grade 0   Fatigue  Grade 2  Moderate, occasionally limiting ADLs (unchanged from previous)  Fever Grade 0   Insomnia  Grade 2     Irritability Grade 0   Muscle weakness trunk Grade 0   Nausea Grade 0   Peripheral motor neuropathy Grade 0   Peripheral sensory neuropathy  Grade 2 Moderate symptoms,  unchanged from previous  Rash acneiform Grade 0   Vomiting Grade 0    Non-reportable AEs (unsolicited, < Grade 3): - Moderate pain (grade 2) o Back pain - Glaucoma (1) - Headache (2)  Plan: Informed patient research nurse will call her with CBC results and instructions regarding starting next cycle of Revlimid. Reviewed schedule to return for labs in 3 weeks prior to next MD appointment in 4 weeks.  Patient verbalized understanding.  CFoye Spurling BSN, RN Clinical Research Nurse 08/07/2020 8:30 am  Called patient's home number and LVM informing of lab results are ok to start cycle of revlimid today as planned. Instructed patient to return nurse's call with any questions.  8:45 am

## 2020-10-20 ENCOUNTER — Other Ambulatory Visit: Payer: Self-pay | Admitting: Hematology and Oncology

## 2020-10-20 DIAGNOSIS — C9001 Multiple myeloma in remission: Secondary | ICD-10-CM

## 2020-10-22 ENCOUNTER — Telehealth: Payer: Self-pay | Admitting: *Deleted

## 2020-10-22 NOTE — Telephone Encounter (Signed)
[id number] Study; Patient confirmed her lab appointment for tomorrow morning and next week with Dr. Alvy Bimler.  Informed patient that research nurse will call her again if any changes to her schedule (we may need to add lab appt next week depending on results from this week).  Patient verbalized understanding and stated she was feeling "fine."  Thanked patient for her time and plan to see her next week.  Foye Spurling, BSN, RN Clinical Research Nurse 10/22/2020 5:02 PM

## 2020-10-22 NOTE — Telephone Encounter (Signed)
Pls refill electronically °

## 2020-10-23 ENCOUNTER — Inpatient Hospital Stay: Payer: Medicare Other

## 2020-10-23 ENCOUNTER — Other Ambulatory Visit: Payer: Self-pay

## 2020-10-23 DIAGNOSIS — Z006 Encounter for examination for normal comparison and control in clinical research program: Secondary | ICD-10-CM | POA: Diagnosis not present

## 2020-10-23 DIAGNOSIS — C9001 Multiple myeloma in remission: Secondary | ICD-10-CM

## 2020-10-23 LAB — CBC WITH DIFFERENTIAL (CANCER CENTER ONLY)
Abs Immature Granulocytes: 0.02 10*3/uL (ref 0.00–0.07)
Basophils Absolute: 0 10*3/uL (ref 0.0–0.1)
Basophils Relative: 1 %
Eosinophils Absolute: 0.1 10*3/uL (ref 0.0–0.5)
Eosinophils Relative: 3 %
HCT: 36.2 % (ref 36.0–46.0)
Hemoglobin: 11.7 g/dL — ABNORMAL LOW (ref 12.0–15.0)
Immature Granulocytes: 1 %
Lymphocytes Relative: 45 %
Lymphs Abs: 1.5 10*3/uL (ref 0.7–4.0)
MCH: 23.6 pg — ABNORMAL LOW (ref 26.0–34.0)
MCHC: 32.3 g/dL (ref 30.0–36.0)
MCV: 73.1 fL — ABNORMAL LOW (ref 80.0–100.0)
Monocytes Absolute: 0.5 10*3/uL (ref 0.1–1.0)
Monocytes Relative: 14 %
Neutro Abs: 1.2 10*3/uL — ABNORMAL LOW (ref 1.7–7.7)
Neutrophils Relative %: 36 %
Platelet Count: 214 10*3/uL (ref 150–400)
RBC: 4.95 MIL/uL (ref 3.87–5.11)
RDW: 15.2 % (ref 11.5–15.5)
WBC Count: 3.3 10*3/uL — ABNORMAL LOW (ref 4.0–10.5)
nRBC: 0 % (ref 0.0–0.2)

## 2020-10-23 LAB — CMP (CANCER CENTER ONLY)
ALT: 21 U/L (ref 0–44)
AST: 18 U/L (ref 15–41)
Albumin: 3.6 g/dL (ref 3.5–5.0)
Alkaline Phosphatase: 51 U/L (ref 38–126)
Anion gap: 10 (ref 5–15)
BUN: 8 mg/dL (ref 8–23)
CO2: 24 mmol/L (ref 22–32)
Calcium: 9 mg/dL (ref 8.9–10.3)
Chloride: 106 mmol/L (ref 98–111)
Creatinine: 0.9 mg/dL (ref 0.44–1.00)
GFR, Estimated: >60 mL/min (ref >60)
Glucose, Bld: 87 mg/dL (ref 70–99)
Potassium: 3.8 mmol/L (ref 3.5–5.1)
Sodium: 140 mmol/L (ref 135–145)
Total Bilirubin: 0.6 mg/dL (ref 0.3–1.2)
Total Protein: 7.5 g/dL (ref 6.5–8.1)

## 2020-10-23 LAB — TSH: TSH: 1.507 u[IU]/mL (ref 0.308–3.960)

## 2020-10-23 LAB — LACTATE DEHYDROGENASE: LDH: 209 U/L — ABNORMAL HIGH (ref 98–192)

## 2020-10-24 LAB — KAPPA/LAMBDA LIGHT CHAINS
Kappa free light chain: 28 mg/L — ABNORMAL HIGH (ref 3.3–19.4)
Kappa, lambda light chain ratio: 1.48 (ref 0.26–1.65)
Lambda free light chains: 18.9 mg/L (ref 5.7–26.3)

## 2020-10-26 LAB — MULTIPLE MYELOMA PANEL, SERUM
Albumin SerPl Elph-Mcnc: 3.5 g/dL (ref 2.9–4.4)
Albumin/Glob SerPl: 1.1 (ref 0.7–1.7)
Alpha 1: 0.2 g/dL (ref 0.0–0.4)
Alpha2 Glob SerPl Elph-Mcnc: 0.5 g/dL (ref 0.4–1.0)
B-Globulin SerPl Elph-Mcnc: 1 g/dL (ref 0.7–1.3)
Gamma Glob SerPl Elph-Mcnc: 1.6 g/dL (ref 0.4–1.8)
Globulin, Total: 3.3 g/dL (ref 2.2–3.9)
IgA: 125 mg/dL (ref 87–352)
IgG (Immunoglobin G), Serum: 1773 mg/dL — ABNORMAL HIGH (ref 586–1602)
IgM (Immunoglobulin M), Srm: 62 mg/dL (ref 26–217)
Total Protein ELP: 6.8 g/dL (ref 6.0–8.5)

## 2020-10-30 ENCOUNTER — Encounter: Payer: Self-pay | Admitting: Hematology and Oncology

## 2020-10-30 ENCOUNTER — Inpatient Hospital Stay: Payer: Medicare Other | Admitting: *Deleted

## 2020-10-30 ENCOUNTER — Inpatient Hospital Stay: Payer: Medicare Other | Admitting: Hematology and Oncology

## 2020-10-30 ENCOUNTER — Other Ambulatory Visit: Payer: Self-pay

## 2020-10-30 ENCOUNTER — Telehealth: Payer: Self-pay | Admitting: Hematology and Oncology

## 2020-10-30 DIAGNOSIS — Z006 Encounter for examination for normal comparison and control in clinical research program: Secondary | ICD-10-CM | POA: Diagnosis not present

## 2020-10-30 DIAGNOSIS — D61811 Other drug-induced pancytopenia: Secondary | ICD-10-CM | POA: Diagnosis not present

## 2020-10-30 DIAGNOSIS — C9001 Multiple myeloma in remission: Secondary | ICD-10-CM | POA: Diagnosis not present

## 2020-10-30 DIAGNOSIS — K5909 Other constipation: Secondary | ICD-10-CM | POA: Insufficient documentation

## 2020-10-30 MED ORDER — LENALIDOMIDE 5 MG PO CAPS CTSU E1A11: ORAL_CAPSULE | ORAL | 0 refills | Status: DC

## 2020-10-30 NOTE — Assessment & Plan Note (Signed)
This is likely due to recent treatment. The patient denies recent history of fevers, cough, chills, diarrhea or dysuria. She is asymptomatic from the leukopenia. I will observe for now.  I will continue the chemotherapy at current dose without dosage adjustment.  If the leukopenia gets progressive worse in the future, I might have to delay her treatment or adjust the chemotherapy dose per protocol

## 2020-10-30 NOTE — Progress Notes (Signed)
[facility] OFFICE PROGRESS NOTE  Patient Care Team: Georgann Housekeeper, MD as PCP - General (Internal Medicine) Artis Delay, MD as Consulting Physician (Hematology and Oncology)  ASSESSMENT & PLAN:  Multiple myeloma in remission [facility]) Her recent myeloma panel showed that she is in remission The patient is comfortable to remain on Revlimid indefinitely. She will continue treatment per research protocol She will continue calcium with vitamin D supplement and aspirin for DVT prophylaxis  Drug-induced pancytopenia (HCC) This is likely due to recent treatment. The patient denies recent history of fevers, cough, chills, diarrhea or dysuria. She is asymptomatic from the leukopenia. I will observe for now.  I will continue the chemotherapy at current dose without dosage adjustment.  If the leukopenia gets progressive worse in the future, I might have to delay her treatment or adjust the chemotherapy dose per protocol   Other constipation Not likely due to treatment I recommend aggressive laxatives   No orders of the defined types were placed in this encounter.   All questions were answered. The patient knows to call the clinic with any problems, questions or concerns. The total time spent in the appointment was 20 minutes encounter with patients including review of chart and various tests results, discussions about plan of care and coordination of care plan   Artis Delay, MD 10/30/2020 8:14 AM  INTERVAL HISTORY: Please see below for problem oriented charting. She returns for myeloma follow-up She is doing well No recent bone pain No recent infection, fever or chills She has some recent mild constipation She is active and started swimming again  SUMMARY OF ONCOLOGIC HISTORY: Oncology History Overview Note  ISS stage 1 IgG lambda subtype (serum albumin 3.6, Beta2 microglobulin 2.32) Durie Salmon Stage 1   Multiple myeloma in remission (HCC)  10/10/2013 Imaging    Skeletal survery was negative   11/09/2013 Bone Marrow Biopsy   BM biopsy confirmed myeloma, 76% involved, IgG lambda subtype   12/06/2013 - 08/29/2014 Chemotherapy   Sh received chemo with revlimid, Velcade, Dexamethasone and Zometa. 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   05/05/2014 Imaging   Imaging study of the neck showed no explanation that could cause right neck pain. She is noted to have incidental left upper lung nodule. Plan to repeat imaging study in 3 months.   09/06/2014 Imaging   Bone survey showed no evidence of fracture   09/14/2014 Bone Marrow Biopsy   Bone marrow biopsy showed 8% residual plasma cells by manual count but none on the biopsy specimen   09/26/2014 -  Chemotherapy   She is started on cycle 1 of maintenance Revlimid   09/26/2014 -  Chemotherapy   The patient had lenalidomide (REVLIMID) 15 MG capsule, 3 of 3 cycles lenalidomide (REVLIMID) 10 MG capsule, 1 of 1 cycle, Start date: 01/18/2015, End date: 02/07/2015 lenalidomide (REVLIMID) 5 MG capsule, 71 of 75 cycles  for chemotherapy treatment.    01/31/2015 Imaging    chest x-ray showed pneumonia. Treatment was placed on hold.   05/03/2015 Bone Marrow Biopsy   Accession: [id number] repeat bone marrow aspirate and biopsy show 5% residual plasma cells   10/14/2016 Bone Marrow Biopsy   Bone marrow biopsy showed the plasma cells represent 4% of all cells with lack of large aggregates or sheets. To assess the plasma cell  clonality, immunohistochemical stains is performed and it lack clonality. Normal cytogenetics and FISH   01/09/2017 Imaging   CT chest showed ground-glass 1.5 cm apical left upper lobe pulmonary nodule. Initial follow-up with CT at 6-12 months is recommended to confirm persistence. If persistent, repeat CT is recommended  every 2 years until 5 years of stability has been established. This recommendation follows the consensus statement: Guidelines for Management of Incidental Pulmonary Nodules Detected on CT Images: From the Fleischner Society 2017; Radiology 2017; 284:228-243. 2. Mild patchy ground-glass opacities in the right upper lobe, probably inflammatory, which can also be reassessed on follow-up chest CT performed for the above dominant ground-glass nodule. 3. Solitary 3 mm right lower lobe solid pulmonary nodule, which can also be reassessed on follow-up chest CT performed for the above dominant ground-glass nodule. 4. No thoracic adenopathy. 5. Aortic atherosclerosis. Two-vessel coronary atherosclerosis.   06/30/2017 Imaging   1. No interval change in the 11 x 13 mm ground-glass nodule left upper lobe. Given the nearly 6 months of imaging stability, repeat CT is recommended every 2 years until 5 years of stability has been established. This recommendation follows the consensus statement: Guidelines for Management of Incidental Pulmonary Nodules Detected on CT Images: From the Fleischner Society 2017; Radiology 2017; 284:228-243. 2. Interval resolution of the tiny patchy ground-glass nodules right upper lobe, likely infectious/inflammatory etiology. 3. Stable 3 mm right lower lobe pulmonary nodule. Attention on follow-up recommended. 4. Aortic Atherosclerois (ICD10-170.0)   09/29/2017 Imaging   Skeletal survey 1. Questionable new tiny lucency noted the posterior portion of C3 vertebral body. This may represent a small lytic lesion.  2. No definite thoracic lesion noted on today's exam. Stable lucencies in the left ilium and acetabulum.  3. No other focal abnormalities identified. The left hip is unremarkable .     REVIEW OF SYSTEMS:   Constitutional: Denies fevers, chills or abnormal weight loss Eyes: Denies blurriness of vision Ears, nose, mouth, throat, and face: Denies mucositis or sore  throat Respiratory: Denies cough, dyspnea or wheezes Cardiovascular: Denies palpitation, chest discomfort or lower extremity swelling Skin: Denies abnormal skin rashes Lymphatics: Denies new lymphadenopathy or easy bruising Neurological:Denies numbness, tingling or new weaknesses Behavioral/Psych: Mood is stable, no new changes  All other systems were reviewed with the patient and are negative.  I have reviewed the past medical history, past surgical history, social history and family history with the patient and they are unchanged from previous note.  ALLERGIES:  has No Known Allergies.  MEDICATIONS:  Current Outpatient Medications  Medication Sig Dispense Refill  . aspirin 325 MG tablet Take 325 mg by mouth daily.     Marland Kitchen CANNABIDIOL PO Take 500 mg by mouth as needed.    . Cholecalciferol (VITAMIN D3) 2000 UNITS TABS Take 2,000 Units by mouth daily.    . dorzolamide-timolol (COSOPT) 22.3-6.8 MG/ML ophthalmic solution Place 1 drop into both eyes 2 (two) times daily.    . hydrochlorothiazide (HYDRODIURIL) 25 MG tablet Take 25 mg by mouth every evening.     Marland Kitchen lenalidomide (REVLIMID) 5 MG capsule TAKE 1 CAPSULE BY MOUTH  EVERY OTHER DAY FOR 21 DAYS ON THEN 7 DAYS OFF 11 capsule 0  . levothyroxine (SYNTHROID, LEVOTHROID) 75 MCG tablet Take 75 mcg by mouth daily before breakfast.    . methocarbamol (ROBAXIN) 500 MG tablet Take 1 tablet (500 mg total) by mouth every 8 (eight) hours as needed for muscle spasms. 90 tablet 1  . Multiple Vitamins-Minerals (CENTRUM SILVER PO)  Take 1 tablet by mouth daily.     . polyethylene glycol (MIRALAX / GLYCOLAX) packet Take 17 g by mouth daily as needed for mild constipation.     . RESTASIS 0.05 % ophthalmic emulsion PLACE 1 DROP IN BOTH EYES IN THE EVENING  4  . sitaGLIPtin (JANUVIA) 50 MG tablet Take 50 mg by mouth daily.    . traZODone (DESYREL) 50 MG tablet TAKE 1 TABLET (50 MG TOTAL) BY MOUTH AT BEDTIME. 90 tablet 3  . venlafaxine XR (EFFEXOR-XR) 150 MG  24 hr capsule Take 1 capsule (150 mg total) by mouth daily with breakfast. 90 capsule 3  . verapamil (VERELAN PM) 120 MG 24 hr capsule Take 1 capsule (120 mg total) by mouth daily. 90 capsule 3   No current facility-administered medications for this visit.   Facility-Administered Medications Ordered in Other Visits  Medication Dose Route Frequency Provider Last Rate Last Admin  . gadopentetate dimeglumine (MAGNEVIST) injection 15 mL  15 mL Intravenous Once PRN Melvenia Beam, MD        PHYSICAL EXAMINATION: ECOG PERFORMANCE STATUS: 0 - Asymptomatic  Vitals:   10/30/20 0752  BP: 127/90  Pulse: 60  Resp: 18  Temp: 98.1 F (36.7 C)  SpO2: 100%   Filed Weights   10/30/20 0752  Weight: 166 lb 12.8 oz (75.7 kg)    GENERAL:alert, no distress and comfortable SKIN: skin color, texture, turgor are normal, no rashes or significant lesions EYES: normal, Conjunctiva are pink and non-injected, sclera clear OROPHARYNX:no exudate, no erythema and lips, buccal mucosa, and tongue normal  NECK: supple, thyroid normal size, non-tender, without nodularity LYMPH:  no palpable lymphadenopathy in the cervical, axillary or inguinal LUNGS: clear to auscultation and percussion with normal breathing effort HEART: regular rate & rhythm and no murmurs and no lower extremity edema ABDOMEN:abdomen soft, non-tender and normal bowel sounds Musculoskeletal:no cyanosis of digits and no clubbing  NEURO: alert & oriented x 3 with fluent speech, no focal motor/sensory deficits  LABORATORY DATA:  I have reviewed the data as listed    Component Value Date/Time   NA 140 10/23/2020 0744   NA 141 09/29/2017 0830   K 3.8 10/23/2020 0744   K 3.6 09/29/2017 0830   CL 106 10/23/2020 0744   CO2 24 10/23/2020 0744   CO2 29 09/29/2017 0830   GLUCOSE 87 10/23/2020 0744   GLUCOSE 87 09/29/2017 0830   BUN 8 10/23/2020 0744   BUN 14.0 09/29/2017 0830   CREATININE 0.90 10/23/2020 0744   CREATININE 0.9 09/29/2017  0830   CALCIUM 9.0 10/23/2020 0744   CALCIUM 9.6 09/29/2017 0830   PROT 7.5 10/23/2020 0744   PROT 7.3 09/29/2017 0832   PROT 8.0 09/29/2017 0830   ALBUMIN 3.6 10/23/2020 0744   ALBUMIN 3.8 09/29/2017 0830   AST 18 10/23/2020 0744   AST 21 09/29/2017 0830   ALT 21 10/23/2020 0744   ALT 69 (H) 09/29/2017 0830   ALKPHOS 51 10/23/2020 0744   ALKPHOS 60 09/29/2017 0830   BILITOT 0.6 10/23/2020 0744   BILITOT 0.46 09/29/2017 0830   GFRNONAA >60 10/23/2020 0744   GFRAA >60 07/31/2020 0737    No results found for: SPEP, UPEP  Lab Results  Component Value Date   WBC 3.3 (L) 10/23/2020   NEUTROABS 1.2 (L) 10/23/2020   HGB 11.7 (L) 10/23/2020   HCT 36.2 10/23/2020   MCV 73.1 (L) 10/23/2020   PLT 214 10/23/2020      Chemistry  Component Value Date/Time   NA 140 10/23/2020 0744   NA 141 09/29/2017 0830   K 3.8 10/23/2020 0744   K 3.6 09/29/2017 0830   CL 106 10/23/2020 0744   CO2 24 10/23/2020 0744   CO2 29 09/29/2017 0830   BUN 8 10/23/2020 0744   BUN 14.0 09/29/2017 0830   CREATININE 0.90 10/23/2020 0744   CREATININE 0.9 09/29/2017 0830      Component Value Date/Time   CALCIUM 9.0 10/23/2020 0744   CALCIUM 9.6 09/29/2017 0830   ALKPHOS 51 10/23/2020 0744   ALKPHOS 60 09/29/2017 0830   AST 18 10/23/2020 0744   AST 21 09/29/2017 0830   ALT 21 10/23/2020 0744   ALT 69 (H) 09/29/2017 0830   BILITOT 0.6 10/23/2020 0744   BILITOT 0.46 09/29/2017 0830

## 2020-10-30 NOTE — Research (Signed)
Research - [facility] ECOG-ACRIN [id number] Maintenance Cycle 76, Day 1  Patient into clinic unaccompanied today for evaluation prior to beginning treatment Cycle 76.   Labs w/myeloma panel: Obtained on 10/23/2020 within 7-day window per protocol, to allow adequate time for results prior to treatment. Results reviewed by Dr. Alvy Bimler and found to be within parameters for continued dosing at Maintenance phase Dose Level -3, Revlimid 5 mg every other day for 21 days, every 28 days. Review of myeloma panel revealed evidence of Complete Response. Per MD, bone marrow biopsy and aspirate would not be considered standard of care at this time.  Elevated LDH result is clinically insignificant per Dr. Alvy Bimler.   History and physical exam: See MD note dated today.     Adverse Events:  Patient reports ongoing fatigue, not limiting self-care (grade 2). She reports ongoing moderate difficulty sleeping (grade 2) with occasional use of trazodone.  Patient takes naps about once a week. 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 Patient denies diarrhea this past month and instead reports persistent constipation most of the month with regular use of miralax (grade 2).   Patient also notes daily moderate back ache which she used methocarbamol once and tramadol once this past month (grade 2). She continues to exercise regularly swimming laps in the pool 3 times weekly on average this past month.  Maintenance therapy with Revlimid: Based on lab results review and history and physical by Dr. Alvy Bimler, patient meets criteria for continued treatment with Revlimid, which she is in agreement to do. Patient is aware that treatment will continue indefinitely, unless there is evidence of disease recurrence, adverse effects requiring treatment discontinuation, or patient/physician decision to discontinue therapy. Patient returned completed cycle 75 Medication Calendar, confirming dosing at dose level -3, Revlimid  67m every other day, Days 1-21. Patient notes that no doses were missed. Patient given printed appointment calendar with Cycle 76 treatment days marked, to document doses for the next treatment cycle, scheduled to begin today.  Plan: Patient will continue with monthly clinic visits on 11/27/2020 and 12/26/2019 for hematology and AE assessments prior to beginning subsequent treatment cycles. Myeloma panel and other labs, to be performed on 01/15/2021 in advance of the next MD clinic visit at the end of Cycle 78, scheduled for 01/22/2021.  Adverse Event Log Study/Protocol: [facility] ECOG E1A11 Maintenance Cycles 73-75: 08/07/2020-10/30/2020 (end of Cycle 75 = 157/84/6962 Solicited &/or Reportable Events Grade Attribution to lenalidomide Cycle # Comments  Anemia Grade 1 Definite 73,75    Hyperglycemia Grade 0 -    Lymphocyte count decreased Grade 0 -    Neutrophil count decreased Grade 2  Definite 73,74,75   Platelet count decreased Grade 0 -     Diarrhea Grade 1 Possible 74   Dyspnea Grade 0 -    Edema: limbs Grade 0 -    Fatigue  Grade 2  Probable 73, 74, 75 Moderate, occasionally limiting ADLs (unchanged from previous)  Fever Grade 0 -    Insomnia  Grade 2   Unlikely 795,28,41Trazodone PRN  Irritability Grade 0     Muscle weakness trunk Grade 0 -    Nausea Grade 0 -    Peripheral motor neuropathy Grade 0     Peripheral sensory neuropathy  Grade 2 Unlikely 73, 74, 75 Moderate symptoms, unchanged from previous  Rash acneiform Grade 0 -    Vomiting Grade 0 -     Non-reportable AEs (unsolicited, < Grade 3):  Leukopenia (grade 1)  Moderate pain (grade 2) ? Back pain  Glaucoma (1)  Headache (2)  Stomachache (1)  Constipation (2)  Weight Loss (1)  Hypertension (2)  Foye Spurling, BSN, RN Clinical Research Nurse 10/30/2020

## 2020-10-30 NOTE — Assessment & Plan Note (Signed)
Not likely due to treatment I recommend aggressive laxatives

## 2020-10-30 NOTE — Assessment & Plan Note (Signed)
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 She will continue treatment per research protocol She will continue calcium with vitamin D supplement and aspirin for DVT prophylaxis 

## 2020-10-30 NOTE — Telephone Encounter (Signed)
Scheduled appointments per 11/30 sch msg. Spoke to patient who is aware of all appointments dates and times. Gave patient calendar print out.

## 2020-11-13 ENCOUNTER — Other Ambulatory Visit: Payer: Self-pay | Admitting: Internal Medicine

## 2020-11-13 DIAGNOSIS — Z1231 Encounter for screening mammogram for malignant neoplasm of breast: Secondary | ICD-10-CM

## 2020-11-17 ENCOUNTER — Other Ambulatory Visit: Payer: Self-pay | Admitting: Hematology and Oncology

## 2020-11-17 DIAGNOSIS — C9001 Multiple myeloma in remission: Secondary | ICD-10-CM

## 2020-11-19 NOTE — Telephone Encounter (Signed)
Pls refill electronically °

## 2020-11-27 ENCOUNTER — Other Ambulatory Visit: Payer: Self-pay

## 2020-11-27 ENCOUNTER — Inpatient Hospital Stay: Payer: Medicare Other | Attending: Hematology and Oncology

## 2020-11-27 ENCOUNTER — Encounter: Payer: Self-pay | Admitting: *Deleted

## 2020-11-27 DIAGNOSIS — C9001 Multiple myeloma in remission: Secondary | ICD-10-CM

## 2020-11-27 DIAGNOSIS — Z006 Encounter for examination for normal comparison and control in clinical research program: Secondary | ICD-10-CM | POA: Diagnosis not present

## 2020-11-27 LAB — CBC WITH DIFFERENTIAL (CANCER CENTER ONLY)
Abs Immature Granulocytes: 0.01 10*3/uL (ref 0.00–0.07)
Basophils Absolute: 0 10*3/uL (ref 0.0–0.1)
Basophils Relative: 1 %
Eosinophils Absolute: 0.1 10*3/uL (ref 0.0–0.5)
Eosinophils Relative: 2 %
HCT: 37 % (ref 36.0–46.0)
Hemoglobin: 11.6 g/dL — ABNORMAL LOW (ref 12.0–15.0)
Immature Granulocytes: 0 %
Lymphocytes Relative: 53 %
Lymphs Abs: 2 10*3/uL (ref 0.7–4.0)
MCH: 23 pg — ABNORMAL LOW (ref 26.0–34.0)
MCHC: 31.4 g/dL (ref 30.0–36.0)
MCV: 73.4 fL — ABNORMAL LOW (ref 80.0–100.0)
Monocytes Absolute: 0.5 10*3/uL (ref 0.1–1.0)
Monocytes Relative: 13 %
Neutro Abs: 1.2 10*3/uL — ABNORMAL LOW (ref 1.7–7.7)
Neutrophils Relative %: 31 %
Platelet Count: 198 10*3/uL (ref 150–400)
RBC: 5.04 MIL/uL (ref 3.87–5.11)
RDW: 14.6 % (ref 11.5–15.5)
WBC Count: 3.8 10*3/uL — ABNORMAL LOW (ref 4.0–10.5)
nRBC: 0 % (ref 0.0–0.2)

## 2020-11-27 MED ORDER — LENALIDOMIDE 5 MG PO CAPS CTSU E1A11: ORAL_CAPSULE | ORAL | 0 refills | Status: DC

## 2020-11-27 NOTE — Research (Signed)
Research - [facility] ECOG-ACRIN E1A11 Maintenance Cycle 77  Patient into clinic unaccompanied today for evaluation prior to beginning treatment Cycle 77. Met with patient right after her lab appointment.   Maintenance therapy with Revlimid:  Patient returned completed cycle 76 Medication Calendar, confirming dosing at dose level -3, Revlimid 5mg every other day, Days 1-21 with no missed doses. Patient given printed appointment calendar with Cycle 77 treatment days marked, to document doses for the next treatment cycle,scheduled tobegin today.   Adverse Events: Patient reports ongoing fatigue, not limiting self-care (grade 2). She reports ongoing moderate difficulty sleeping (grade 2) x 2 nights and took trazodone for sleep x 1. 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 . Patient also notes moderate back ache almost every day (grade 2) for which she took oxycodone 1 day and tramadol another day. She has had occasional headaches (grade 1) x 3 this month and did not take any medication for this. She denies diarrhea past month and reported no constipation.  She continues to exercise regularly, swimming laps in the pool 3 days per week. .   Adverse Event Log Study/Protocol: [facility] ECOG E1A11 Maintenance Cycle 76: 10/30/2020-11/27/2020 (end of cycle 11/26/2020) Solicited &/or Reportable Events Grade Comments  Anemia Grade 1   Hyperglycemia Grade 0   Lymphocyte count decreased Grade 0   Neutrophil count decreased Grade 2    Platelet count decreased Grade 0   Diarrhea Grade 0   Dyspnea Grade 0   Edema: limbs Grade 0   Fatigue  Grade 2  Moderate, occasionally limiting ADLs(unchanged from previous)  Fever Grade 0   Insomnia  Grade 2     Irritability Grade 0   Muscle weakness trunk Grade 0   Nausea Grade 0   Peripheral motor neuropathy Grade 0   Peripheral sensory neuropathy  Grade 2 Moderate symptoms, unchanged fromprevious  Rash acneiform Grade 0    Vomiting Grade 0    Non-reportable AEs (unsolicited, < Grade 3):  Leukopenia (grade 1)  Moderate pain (grade 2) ? Back pain  Headache (1)   Plan: Informed patient research nurse will call her with CBC results and instructions regarding starting next cycle of Revlimid. Reviewed schedule to return for labs in 4 weeks prior to next cycle of Revlimid. Patient verbalized understanding.   Called patient's home number after labs resulted and spoke with patient's husband, informing him of lab results are ok to start cycle of revlimid today as planned. He verbalized understanding and will relay message to patient.   [name], BSN, RN Clinical Research Nurse 11/27/2020 8:22 AM    

## 2020-12-16 ENCOUNTER — Other Ambulatory Visit: Payer: Self-pay | Admitting: Hematology and Oncology

## 2020-12-16 DIAGNOSIS — C9001 Multiple myeloma in remission: Secondary | ICD-10-CM

## 2020-12-17 NOTE — Telephone Encounter (Signed)
Pls refill electronically °

## 2020-12-25 ENCOUNTER — Inpatient Hospital Stay: Payer: Medicare Other | Attending: Hematology and Oncology

## 2020-12-25 ENCOUNTER — Other Ambulatory Visit: Payer: Self-pay

## 2020-12-25 ENCOUNTER — Other Ambulatory Visit: Payer: Self-pay | Admitting: *Deleted

## 2020-12-25 ENCOUNTER — Encounter: Payer: Self-pay | Admitting: *Deleted

## 2020-12-25 DIAGNOSIS — Z006 Encounter for examination for normal comparison and control in clinical research program: Secondary | ICD-10-CM | POA: Diagnosis not present

## 2020-12-25 DIAGNOSIS — C9001 Multiple myeloma in remission: Secondary | ICD-10-CM | POA: Diagnosis present

## 2020-12-25 LAB — CBC WITH DIFFERENTIAL (CANCER CENTER ONLY)
Abs Immature Granulocytes: 0.01 10*3/uL (ref 0.00–0.07)
Basophils Absolute: 0 10*3/uL (ref 0.0–0.1)
Basophils Relative: 1 %
Eosinophils Absolute: 0.1 10*3/uL (ref 0.0–0.5)
Eosinophils Relative: 2 %
HCT: 38.2 % (ref 36.0–46.0)
Hemoglobin: 11.8 g/dL — ABNORMAL LOW (ref 12.0–15.0)
Immature Granulocytes: 0 %
Lymphocytes Relative: 55 %
Lymphs Abs: 2.3 10*3/uL (ref 0.7–4.0)
MCH: 22.9 pg — ABNORMAL LOW (ref 26.0–34.0)
MCHC: 30.9 g/dL (ref 30.0–36.0)
MCV: 74 fL — ABNORMAL LOW (ref 80.0–100.0)
Monocytes Absolute: 0.6 10*3/uL (ref 0.1–1.0)
Monocytes Relative: 13 %
Neutro Abs: 1.2 10*3/uL — ABNORMAL LOW (ref 1.7–7.7)
Neutrophils Relative %: 29 %
Platelet Count: 203 10*3/uL (ref 150–400)
RBC: 5.16 MIL/uL — ABNORMAL HIGH (ref 3.87–5.11)
RDW: 14.6 % (ref 11.5–15.5)
WBC Count: 4.2 10*3/uL (ref 4.0–10.5)
nRBC: 0 % (ref 0.0–0.2)

## 2020-12-25 MED ORDER — LENALIDOMIDE 5 MG PO CAPS CTSU E1A11: ORAL_CAPSULE | ORAL | 0 refills | Status: DC

## 2020-12-25 NOTE — Research (Signed)
[facility] [id number]  Patient Carla Perry is present today unaccompanied for evaluation prior to beginning treatment Cycle 78.  This RN met with the patient after her lab appointment.  Maintenance therapy with Revlimid:  Patient returned completed cycle 77 medication calendar, confirming dose at dose level -3. Revlimid 33m QOD, Days 1-21, with no missed doses.  Patient was provided calendar to document doses for the next treatment cycle, scheduled to begin today.  Adverse Events:  Patient reports ongoing fatigue, that is unchanged from previous cycle (grade 2), ongoing difficulty sleeping that is unchanged, for which she took trazadone two days (grade 2), peripheral sensory neuropathy that is unchanged (grade 2), and a daily backache for which she took tramadol one day, and oxycodone on one day (ongoing, grade 2).  Patient did not verbalize that she had experienced bone pain, though she does note on her calendar that she experienced three days of bone pain and does not note any medication taken for this; she was able to exercise on one of the days (grade 1).  She is experiencing a headache which started on 12/22/20 and is ongoing today.  The headache is at a 5/10, frontal, and does not have an impact on her ADLs.  She took a tramadol on 12/22/20 for her backache and this did help provide some relief.   She denies all other solicited AEs, see table below.  She continues to exercise regularly, swimming, biking, shoveling snow.  Adverse Event Log Study/Protocol: [facility] ECOG E1A11 Maintenance Cycle 77: 11/27/2020-12/25/20(end of cycle 17/67/0110 Solicited&/or Reportable Events Grade Comments  Anemia Grade1   Hyperglycemia Grade 0   Lymphocyte count decreased Grade 0   Neutrophil count decreased Grade 2    Platelet count decreased Grade 0   Diarrhea Grade0   Dyspnea Grade 0   Edema: limbs Grade 0   Fatigue Grade 2  Moderate, occasionally limiting ADLs(unchanged from previous)   Fever Grade 0   Insomnia Grade2    Irritability Grade 0   Muscle weakness trunk Grade 0   Nausea Grade 0   Peripheral motor neuropathy Grade 0   Peripheral sensory neuropathy Grade 2 Moderate symptoms, unchanged fromprevious  Rash acneiform Grade 0   Vomiting Grade 0    Non-reportable AEs (unsolicited, < Grade 3):  Moderate pain (grade 2) ? Back pain  Headache (1)  Bone Pain (1)  Plan:Informed patient research nurse, CFoye Spurling RN will call her with CBC results and instructions regarding starting next cycle of Revlimid.Patient confirms she has prescription and will await call from [place]  Reviewed schedule to return for labs prior to next cycle of Revlimid on 01/15/21.Patient verbalized understanding. SDoreatha Martin RN, BSN, [facility] 12/25/2020 8:08 AM

## 2020-12-25 NOTE — Research (Signed)
Called patient with lab results and notified that based on results and AE review earlier this morning, she can start next cycle of Revlimid today. Patient verbalized understanding and she confirms her next lab appt in 3 weeks with MD appt the following week.  Thanked patient for her participation and encouraged her to call if any questions or concerns before next visit.  Foye Spurling, BSN, RN Clinical Research Nurse 12/25/2020 8:38 AM

## 2020-12-26 ENCOUNTER — Ambulatory Visit: Payer: Medicare Other

## 2020-12-31 DIAGNOSIS — I200 Unstable angina: Secondary | ICD-10-CM | POA: Diagnosis not present

## 2020-12-31 DIAGNOSIS — K219 Gastro-esophageal reflux disease without esophagitis: Secondary | ICD-10-CM | POA: Diagnosis not present

## 2020-12-31 DIAGNOSIS — E119 Type 2 diabetes mellitus without complications: Secondary | ICD-10-CM | POA: Diagnosis not present

## 2020-12-31 DIAGNOSIS — E1169 Type 2 diabetes mellitus with other specified complication: Secondary | ICD-10-CM | POA: Diagnosis not present

## 2020-12-31 DIAGNOSIS — I10 Essential (primary) hypertension: Secondary | ICD-10-CM | POA: Diagnosis not present

## 2020-12-31 DIAGNOSIS — D508 Other iron deficiency anemias: Secondary | ICD-10-CM | POA: Diagnosis not present

## 2020-12-31 DIAGNOSIS — E039 Hypothyroidism, unspecified: Secondary | ICD-10-CM | POA: Diagnosis not present

## 2021-01-09 ENCOUNTER — Other Ambulatory Visit: Payer: Self-pay

## 2021-01-09 ENCOUNTER — Ambulatory Visit
Admission: RE | Admit: 2021-01-09 | Discharge: 2021-01-09 | Disposition: A | Discharge status: Home / Self Care | Payer: Medicare Other | Source: Ambulatory Visit | Attending: Internal Medicine | Admitting: Internal Medicine

## 2021-01-09 DIAGNOSIS — Z1231 Encounter for screening mammogram for malignant neoplasm of breast: Secondary | ICD-10-CM | POA: Diagnosis not present

## 2021-01-12 ENCOUNTER — Other Ambulatory Visit: Payer: Self-pay | Admitting: Hematology and Oncology

## 2021-01-12 DIAGNOSIS — C9001 Multiple myeloma in remission: Secondary | ICD-10-CM

## 2021-01-15 ENCOUNTER — Inpatient Hospital Stay: Payer: Medicare Other | Attending: Hematology and Oncology

## 2021-01-15 ENCOUNTER — Other Ambulatory Visit: Payer: Self-pay

## 2021-01-15 DIAGNOSIS — D61811 Other drug-induced pancytopenia: Secondary | ICD-10-CM | POA: Insufficient documentation

## 2021-01-15 DIAGNOSIS — R109 Unspecified abdominal pain: Secondary | ICD-10-CM | POA: Insufficient documentation

## 2021-01-15 DIAGNOSIS — Z79899 Other long term (current) drug therapy: Secondary | ICD-10-CM | POA: Insufficient documentation

## 2021-01-15 DIAGNOSIS — C9001 Multiple myeloma in remission: Secondary | ICD-10-CM | POA: Diagnosis present

## 2021-01-15 DIAGNOSIS — Z006 Encounter for examination for normal comparison and control in clinical research program: Secondary | ICD-10-CM | POA: Insufficient documentation

## 2021-01-15 DIAGNOSIS — Z9221 Personal history of antineoplastic chemotherapy: Secondary | ICD-10-CM | POA: Insufficient documentation

## 2021-01-15 LAB — CMP (CANCER CENTER ONLY)
ALT: 30 U/L (ref 0–44)
AST: 25 U/L (ref 15–41)
Albumin: 3.9 g/dL (ref 3.5–5.0)
Alkaline Phosphatase: 54 U/L (ref 38–126)
Anion gap: 8 (ref 5–15)
BUN: 9 mg/dL (ref 8–23)
CO2: 26 mmol/L (ref 22–32)
Calcium: 9.1 mg/dL (ref 8.9–10.3)
Chloride: 105 mmol/L (ref 98–111)
Creatinine: 0.97 mg/dL (ref 0.44–1.00)
GFR, Estimated: >60 mL/min (ref >60)
Glucose, Bld: 97 mg/dL (ref 70–99)
Potassium: 3.7 mmol/L (ref 3.5–5.1)
Sodium: 139 mmol/L (ref 135–145)
Total Bilirubin: 0.7 mg/dL (ref 0.3–1.2)
Total Protein: 7.8 g/dL (ref 6.5–8.1)

## 2021-01-15 LAB — CBC WITH DIFFERENTIAL (CANCER CENTER ONLY)
Abs Immature Granulocytes: 0.01 10*3/uL (ref 0.00–0.07)
Basophils Absolute: 0 10*3/uL (ref 0.0–0.1)
Basophils Relative: 1 %
Eosinophils Absolute: 0.1 10*3/uL (ref 0.0–0.5)
Eosinophils Relative: 3 %
HCT: 39.5 % (ref 36.0–46.0)
Hemoglobin: 12.5 g/dL (ref 12.0–15.0)
Immature Granulocytes: 0 %
Lymphocytes Relative: 52 %
Lymphs Abs: 1.9 10*3/uL (ref 0.7–4.0)
MCH: 23.2 pg — ABNORMAL LOW (ref 26.0–34.0)
MCHC: 31.6 g/dL (ref 30.0–36.0)
MCV: 73.3 fL — ABNORMAL LOW (ref 80.0–100.0)
Monocytes Absolute: 0.4 10*3/uL (ref 0.1–1.0)
Monocytes Relative: 12 %
Neutro Abs: 1.1 10*3/uL — ABNORMAL LOW (ref 1.7–7.7)
Neutrophils Relative %: 32 %
Platelet Count: 214 10*3/uL (ref 150–400)
RBC: 5.39 MIL/uL — ABNORMAL HIGH (ref 3.87–5.11)
RDW: 14.4 % (ref 11.5–15.5)
WBC Count: 3.6 10*3/uL — ABNORMAL LOW (ref 4.0–10.5)
nRBC: 0 % (ref 0.0–0.2)

## 2021-01-15 LAB — TSH: TSH: 0.79 u[IU]/mL (ref 0.308–3.960)

## 2021-01-15 LAB — LACTATE DEHYDROGENASE: LDH: 155 U/L (ref 98–192)

## 2021-01-16 LAB — KAPPA/LAMBDA LIGHT CHAINS
Kappa free light chain: 26.9 mg/L — ABNORMAL HIGH (ref 3.3–19.4)
Kappa, lambda light chain ratio: 1.55 (ref 0.26–1.65)
Lambda free light chains: 17.3 mg/L (ref 5.7–26.3)

## 2021-01-18 LAB — MULTIPLE MYELOMA PANEL, SERUM
Albumin SerPl Elph-Mcnc: 3.9 g/dL (ref 2.9–4.4)
Albumin/Glob SerPl: 1.1 (ref 0.7–1.7)
Alpha 1: 0.2 g/dL (ref 0.0–0.4)
Alpha2 Glob SerPl Elph-Mcnc: 0.6 g/dL (ref 0.4–1.0)
B-Globulin SerPl Elph-Mcnc: 1.1 g/dL (ref 0.7–1.3)
Gamma Glob SerPl Elph-Mcnc: 1.8 g/dL (ref 0.4–1.8)
Globulin, Total: 3.7 g/dL (ref 2.2–3.9)
IgA: 123 mg/dL (ref 87–352)
IgG (Immunoglobin G), Serum: 1899 mg/dL — ABNORMAL HIGH (ref 586–1602)
IgM (Immunoglobulin M), Srm: 48 mg/dL (ref 26–217)
Total Protein ELP: 7.6 g/dL (ref 6.0–8.5)

## 2021-01-22 ENCOUNTER — Encounter: Payer: Self-pay | Admitting: Hematology and Oncology

## 2021-01-22 ENCOUNTER — Encounter: Payer: Self-pay | Admitting: *Deleted

## 2021-01-22 ENCOUNTER — Telehealth: Payer: Self-pay | Admitting: Oncology

## 2021-01-22 ENCOUNTER — Inpatient Hospital Stay: Payer: Medicare Other | Admitting: Hematology and Oncology

## 2021-01-22 ENCOUNTER — Other Ambulatory Visit: Payer: Self-pay

## 2021-01-22 DIAGNOSIS — D508 Other iron deficiency anemias: Secondary | ICD-10-CM | POA: Diagnosis not present

## 2021-01-22 DIAGNOSIS — D61811 Other drug-induced pancytopenia: Secondary | ICD-10-CM | POA: Diagnosis not present

## 2021-01-22 DIAGNOSIS — K219 Gastro-esophageal reflux disease without esophagitis: Secondary | ICD-10-CM | POA: Diagnosis not present

## 2021-01-22 DIAGNOSIS — R109 Unspecified abdominal pain: Secondary | ICD-10-CM

## 2021-01-22 DIAGNOSIS — C9001 Multiple myeloma in remission: Secondary | ICD-10-CM | POA: Diagnosis not present

## 2021-01-22 DIAGNOSIS — Z006 Encounter for examination for normal comparison and control in clinical research program: Secondary | ICD-10-CM | POA: Diagnosis not present

## 2021-01-22 DIAGNOSIS — E039 Hypothyroidism, unspecified: Secondary | ICD-10-CM | POA: Diagnosis not present

## 2021-01-22 DIAGNOSIS — I200 Unstable angina: Secondary | ICD-10-CM | POA: Diagnosis not present

## 2021-01-22 DIAGNOSIS — E1169 Type 2 diabetes mellitus with other specified complication: Secondary | ICD-10-CM | POA: Diagnosis not present

## 2021-01-22 DIAGNOSIS — E119 Type 2 diabetes mellitus without complications: Secondary | ICD-10-CM | POA: Diagnosis not present

## 2021-01-22 DIAGNOSIS — I10 Essential (primary) hypertension: Secondary | ICD-10-CM | POA: Diagnosis not present

## 2021-01-22 MED ORDER — LENALIDOMIDE 5 MG PO CAPS CTSU E1A11: ORAL_CAPSULE | ORAL | 0 refills | Status: DC

## 2021-01-22 NOTE — Progress Notes (Signed)
[place] OFFICE PROGRESS NOTE  Patient Care Team: Wenda Low, MD as PCP - General (Internal Medicine) Heath Lark, MD as Consulting Physician (Hematology and Oncology)  ASSESSMENT & PLAN:  Multiple myeloma in remission [facility]) Her recent myeloma panel showed that she is in remission The patient is comfortable to remain on Revlimid indefinitely. She will continue treatment per research protocol She will continue calcium with vitamin D supplement and aspirin for DVT prophylaxis  Drug-induced pancytopenia ([place]) This is likely due to recent treatment. The patient denies recent history of fevers, cough, chills, diarrhea or dysuria. She is asymptomatic from the leukopenia. I will observe for now.  I will continue the chemotherapy at current dose without dosage adjustment.  If the leukopenia gets progressive worse in the future, I might have to delay her treatment or adjust the chemotherapy dose per protocol   Abdominal wall pain Her abdominal wall pain is unrelated to her treatment of her disease It is likely muscular injury I recommend conservative   No orders of the defined types were placed in this encounter.   All questions were answered. The patient knows to call the clinic with any problems, questions or concerns. The total time spent in the appointment was 20 minutes encounter with patients including review of chart and various tests results, discussions about plan of care and coordination of care plan   Heath Lark, MD 01/22/2021 10:36 AM  INTERVAL HISTORY: Please see below for problem oriented charting. She returns for treatment and follow-up She is doing well The patient is active with swimming Recently, she complained of mild abdominal wall discomfort on the left lower quadrant but that has resolved No new bone pain No recent infection, fever or chills  SUMMARY OF ONCOLOGIC HISTORY: Oncology History Overview Note  ISS stage 1 IgG lambda subtype (serum  albumin 3.6, Beta2 microglobulin 2.32) Durie Salmon Stage 1   Multiple myeloma in remission ([place])  10/10/2013 Imaging   Skeletal survery was negative   11/09/2013 Bone Marrow Biopsy   BM biopsy confirmed myeloma, 76% involved, IgG lambda subtype   12/06/2013 - 08/29/2014 Chemotherapy   Sh received chemo with revlimid, Velcade, Dexamethasone and Zometa. 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   05/05/2014 Imaging   Imaging study of the neck showed no explanation that could cause right neck pain. She is noted to have incidental left upper lung nodule. Plan to repeat imaging study in 3 months.   09/06/2014 Imaging   Bone survey showed no evidence of fracture   09/14/2014 Bone Marrow Biopsy   Bone marrow biopsy showed 8% residual plasma cells by manual count but none on the biopsy specimen   09/26/2014 -  Chemotherapy   She is started on cycle 1 of maintenance Revlimid   09/26/2014 -  Chemotherapy    Patient is on Treatment Plan: [place] [id number] ARM D MAINTENANCE LENALIDOMIDE Q28D      01/31/2015 Imaging    chest x-ray showed pneumonia. Treatment was placed on hold.   05/03/2015 Bone Marrow Biopsy   Accession: [id number] repeat bone marrow aspirate and biopsy show 5% residual plasma cells   10/14/2016 Bone Marrow Biopsy   Bone marrow biopsy showed the plasma cells represent 4% of all cells with lack of large aggregates or sheets. To assess the plasma cell  clonality, immunohistochemical stains is performed and it lack clonality. Normal cytogenetics and FISH   01/09/2017 Imaging   CT chest showed ground-glass 1.5 cm apical left upper lobe pulmonary nodule. Initial follow-up with CT at 6-12 months is recommended to confirm persistence. If persistent, repeat CT is recommended every 2 years until 5  years of stability has been established. This recommendation follows the consensus statement: Guidelines for Management of Incidental Pulmonary Nodules Detected on CT Images: From the Fleischner Society 2017; Radiology 2017; 284:228-243. 2. Mild patchy ground-glass opacities in the right upper lobe, probably inflammatory, which can also be reassessed on follow-up chest CT performed for the above dominant ground-glass nodule. 3. Solitary 3 mm right lower lobe solid pulmonary nodule, which can also be reassessed on follow-up chest CT performed for the above dominant ground-glass nodule. 4. No thoracic adenopathy. 5. Aortic atherosclerosis. Two-vessel coronary atherosclerosis.   06/30/2017 Imaging   1. No interval change in the 11 x 13 mm ground-glass nodule left upper lobe. Given the nearly 6 months of imaging stability, repeat CT is recommended every 2 years until 5 years of stability has been established. This recommendation follows the consensus statement: Guidelines for Management of Incidental Pulmonary Nodules Detected on CT Images: From the Fleischner Society 2017; Radiology 2017; 284:228-243. 2. Interval resolution of the tiny patchy ground-glass nodules right upper lobe, likely infectious/inflammatory etiology. 3. Stable 3 mm right lower lobe pulmonary nodule. Attention on follow-up recommended. 4. Aortic Atherosclerois (ICD10-170.0)   09/29/2017 Imaging   Skeletal survey 1. Questionable new tiny lucency noted the posterior portion of C3 vertebral body. This may represent a small lytic lesion.  2. No definite thoracic lesion noted on today's exam. Stable lucencies in the left ilium and acetabulum.  3. No other focal abnormalities identified. The left hip is unremarkable .     REVIEW OF SYSTEMS:   Constitutional: Denies fevers, chills or abnormal weight loss Eyes: Denies blurriness of vision Ears, nose, mouth, throat, and face: Denies mucositis or sore throat Respiratory: Denies  cough, dyspnea or wheezes Cardiovascular: Denies palpitation, chest discomfort or lower extremity swelling Gastrointestinal:  Denies nausea, heartburn or change in bowel habits Skin: Denies abnormal skin rashes Lymphatics: Denies new lymphadenopathy or easy bruising Neurological:Denies numbness, tingling or new weaknesses Behavioral/Psych: Mood is stable, no new changes  All other systems were reviewed with the patient and are negative.  I have reviewed the past medical history, past surgical history, social history and family history with the patient and they are unchanged from previous note.  ALLERGIES:  has No Known Allergies.  MEDICATIONS:  Current Outpatient Medications  Medication Sig Dispense Refill  . rosuvastatin (CRESTOR) 5 MG tablet Take 5 mg by mouth daily at 2 PM.    . aspirin 325 MG tablet Take 325 mg by mouth daily.     Marland Kitchen CANNABIDIOL PO Take 500 mg by mouth as needed.    . Cholecalciferol (VITAMIN D3) 2000 UNITS TABS Take 2,000 Units by mouth daily.    . dorzolamide-timolol (COSOPT) 22.3-6.8 MG/ML ophthalmic solution Place 1 drop into both eyes 2 (two) times daily.    . hydrochlorothiazide (HYDRODIURIL) 25 MG tablet Take 25 mg by mouth every evening.     Marland Kitchen lenalidomide (REVLIMID) 5 MG capsule TAKE 1 CAPSULE BY MOUTH  EVERY OTHER DAY FOR 21 DAYS ON THEN 7 DAYS OFF 11 capsule 0  . levothyroxine (SYNTHROID, LEVOTHROID) 75 MCG tablet Take 75 mcg by mouth daily before breakfast.    . methocarbamol (ROBAXIN) 500 MG  tablet Take 1 tablet (500 mg total) by mouth every 8 (eight) hours as needed for muscle spasms. 90 tablet 1  . Multiple Vitamins-Minerals (CENTRUM SILVER PO) Take 1 tablet by mouth daily.     . polyethylene glycol (MIRALAX / GLYCOLAX) packet Take 17 g by mouth daily as needed for mild constipation.     . RESTASIS 0.05 % ophthalmic emulsion PLACE 1 DROP IN BOTH EYES IN THE EVENING  4  . sitaGLIPtin (JANUVIA) 50 MG tablet Take 50 mg by mouth daily.    . traZODone  (DESYREL) 50 MG tablet TAKE 1 TABLET (50 MG TOTAL) BY MOUTH AT BEDTIME. 90 tablet 3  . venlafaxine XR (EFFEXOR-XR) 150 MG 24 hr capsule Take 1 capsule (150 mg total) by mouth daily with breakfast. 90 capsule 3  . verapamil (VERELAN PM) 120 MG 24 hr capsule Take 1 capsule (120 mg total) by mouth daily. 90 capsule 3   No current facility-administered medications for this visit.   Facility-Administered Medications Ordered in Other Visits  Medication Dose Route Frequency Provider Last Rate Last Admin  . gadopentetate dimeglumine (MAGNEVIST) injection 15 mL  15 mL Intravenous Once PRN Melvenia Beam, MD        PHYSICAL EXAMINATION: ECOG PERFORMANCE STATUS: 0 - Asymptomatic  Vitals:   01/22/21 0943  BP: 122/76  Pulse: 73  Resp: 13  Temp: 97.8 F (36.6 C)  SpO2: 100%   Filed Weights   01/22/21 0943  Weight: 165 lb 9.6 oz (75.1 kg)    GENERAL:alert, no distress and comfortable SKIN: skin color, texture, turgor are normal, no rashes or significant lesions EYES: normal, Conjunctiva are pink and non-injected, sclera clear OROPHARYNX:no exudate, no erythema and lips, buccal mucosa, and tongue normal  NECK: supple, thyroid normal size, non-tender, without nodularity LYMPH:  no palpable lymphadenopathy in the cervical, axillary or inguinal LUNGS: clear to auscultation and percussion with normal breathing effort HEART: regular rate & rhythm and no murmurs and no lower extremity edema ABDOMEN:abdomen soft, non-tender and normal bowel sounds Musculoskeletal:no cyanosis of digits and no clubbing  NEURO: alert & oriented x 3 with fluent speech, no focal motor/sensory deficits  LABORATORY DATA:  I have reviewed the data as listed    Component Value Date/Time   NA 139 01/15/2021 0711   NA 141 09/29/2017 0830   K 3.7 01/15/2021 0711   K 3.6 09/29/2017 0830   CL 105 01/15/2021 0711   CO2 26 01/15/2021 0711   CO2 29 09/29/2017 0830   GLUCOSE 97 01/15/2021 0711   GLUCOSE 87 09/29/2017  0830   BUN 9 01/15/2021 0711   BUN 14.0 09/29/2017 0830   CREATININE 0.97 01/15/2021 0711   CREATININE 0.9 09/29/2017 0830   CALCIUM 9.1 01/15/2021 0711   CALCIUM 9.6 09/29/2017 0830   PROT 7.8 01/15/2021 0711   PROT 7.3 09/29/2017 0832   PROT 8.0 09/29/2017 0830   ALBUMIN 3.9 01/15/2021 0711   ALBUMIN 3.8 09/29/2017 0830   AST 25 01/15/2021 0711   AST 21 09/29/2017 0830   ALT 30 01/15/2021 0711   ALT 69 (H) 09/29/2017 0830   ALKPHOS 54 01/15/2021 0711   ALKPHOS 60 09/29/2017 0830   BILITOT 0.7 01/15/2021 0711   BILITOT 0.46 09/29/2017 0830   GFRNONAA >60 01/15/2021 0711   GFRAA >60 07/31/2020 0737    No results found for: SPEP, UPEP  Lab Results  Component Value Date   WBC 3.6 (L) 01/15/2021   NEUTROABS 1.1 (L) 01/15/2021   HGB  12.5 01/15/2021   HCT 39.5 01/15/2021   MCV 73.3 (L) 01/15/2021   PLT 214 01/15/2021      Chemistry      Component Value Date/Time   NA 139 01/15/2021 0711   NA 141 09/29/2017 0830   K 3.7 01/15/2021 0711   K 3.6 09/29/2017 0830   CL 105 01/15/2021 0711   CO2 26 01/15/2021 0711   CO2 29 09/29/2017 0830   BUN 9 01/15/2021 0711   BUN 14.0 09/29/2017 0830   CREATININE 0.97 01/15/2021 0711   CREATININE 0.9 09/29/2017 0830      Component Value Date/Time   CALCIUM 9.1 01/15/2021 0711   CALCIUM 9.6 09/29/2017 0830   ALKPHOS 54 01/15/2021 0711   ALKPHOS 60 09/29/2017 0830   AST 25 01/15/2021 0711   AST 21 09/29/2017 0830   ALT 30 01/15/2021 0711   ALT 69 (H) 09/29/2017 0830   BILITOT 0.7 01/15/2021 0711   BILITOT 0.46 09/29/2017 0830       RADIOGRAPHIC STUDIES: I have personally reviewed the radiological images as listed and agreed with the findings in the report. MM 3D SCREEN BREAST BILATERAL  Result Date: 01/11/2021 CLINICAL DATA:  Screening. EXAM: DIGITAL SCREENING BILATERAL MAMMOGRAM WITH TOMOSYNTHESIS AND CAD TECHNIQUE: Bilateral screening digital craniocaudal and mediolateral oblique mammograms were obtained. Bilateral  screening digital breast tomosynthesis was performed. The images were evaluated with computer-aided detection. COMPARISON:  Previous exam(s). ACR Breast Density Category c: The breast tissue is heterogeneously dense, which may obscure small masses. FINDINGS: There are no findings suspicious for malignancy. IMPRESSION: No mammographic evidence of malignancy. A result letter of this screening mammogram will be mailed directly to the patient. RECOMMENDATION: Screening mammogram in one year. (Code:SM-B-01Y) BI-RADS CATEGORY  1: Negative. Electronically Signed   By: Ammie Ferrier M.D.   On: 01/11/2021 11:38

## 2021-01-22 NOTE — Assessment & Plan Note (Signed)
This is likely due to recent treatment. The patient denies recent history of fevers, cough, chills, diarrhea or dysuria. She is asymptomatic from the leukopenia. I will observe for now.  I will continue the chemotherapy at current dose without dosage adjustment.  If the leukopenia gets progressive worse in the future, I might have to delay her treatment or adjust the chemotherapy dose per protocol

## 2021-01-22 NOTE — Telephone Encounter (Signed)
Scheduled appts per 2/22 sch msg. Gave pt a print out of AVS.

## 2021-01-22 NOTE — Assessment & Plan Note (Signed)
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 She will continue treatment per research protocol She will continue calcium with vitamin D supplement and aspirin for DVT prophylaxis

## 2021-01-22 NOTE — Research (Addendum)
Research - [facility] ECOG-ACRIN [id number] Maintenance Cycle 79, Day 1  Patient into clinic unaccompanied today for evaluation prior to beginning treatment Cycle 79 of revlimid.   Labs w/myeloma panel: Obtained on 01/15/21 within 7-day window per protocol, to allow adequate time for results prior to treatment. Results reviewed by Dr. Alvy Bimler and found to be within parameters for continued dosing at Maintenance phase Dose Level -3, Revlimid 5 mg every other day for 21 days, every 28 days. 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 Per MD, bone marrow biopsy and aspirate would not be considered standard of care at this time.   History and physical exam: See MD note dated today.     Adverse Events:  Patient reports ongoing fatigue, not limiting self-care (grade 2). She reports ongoing moderate difficulty sleeping (grade 2) with occasional use of trazodone.  Patient takes naps occasionally, about once a week or less. 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  Patient also notes daily ongiong moderate back ache which she used methocarbamol twice this past month (grade 2). She continues to exercise regularly swimming laps in the pool 3 times weekly on average. Patient reports new pain left lower abdomen which started this past Saturday 2/19 and has recurred yesterday and today. Patient states it is intermittent in nature and mild (grade 1). She has not taken any medication for this pain. Patient denies any diarrhea or constipation in the past 3 months.   Maintenance therapy with Revlimid: Based on lab results review and history and physical by Dr. Alvy Bimler, patient meets criteria for continued treatment with Revlimid, which she is in agreement to do. Patient is aware that treatment will continue indefinitely, unless there is evidence of disease recurrence, adverse effects requiring treatment discontinuation, or patient/physician decision to discontinue  therapy. Patient returned completed cycle 78 Medication Calendar, confirming dosing at dose level -3, Revlimid 18m every other day, Days 1-21. Patient notes that no doses were missed. Patient given printed appointment calendar with Cycle 79 treatment days marked, to document doses for the next treatment cycle, scheduled to begin today.  Plan: Patient will continue with monthly clinic visits on 02/19/21 and 03/19/21 for hematology and AE assessments prior to beginning subsequent treatment cycles. Myeloma panel and other labs, to be performed on 04/09/2021 in advance of the next MD clinic visit at the end of Cycle 81, scheduled for 04/16/2021.  Adverse Event Log Study/Protocol: [facility] ECOG E1A11 Maintenance Cycles 76-78: 10/30/2020-01/22/2021 (end of Cycle 78 = 066/29/4765 Solicited &/or Reportable Events Grade Attribution to lenalidomide Cycle # Comments  Anemia Grade 1 Definite 76,77    Hyperglycemia Grade 0 -    Lymphocyte count decreased Grade 0 -    Neutrophil count decreased Grade 2  Definite 746,50,35  Platelet count decreased Grade 0 -     Diarrhea Grade 0     Dyspnea Grade 0 -    Edema: limbs Grade 0 -    Fatigue  Grade 2  Probable 76,77,78 Moderate, occasionally limiting ADLs (unchanged from previous)  Fever Grade 0 -    Insomnia  Grade 2   Unlikely 746,56,81Trazodone PRN  Irritability Grade 0     Muscle weakness trunk Grade 0 -    Nausea Grade 0 -    Peripheral motor neuropathy Grade 0     Peripheral sensory neuropathy  Grade 2 Unlikely 727,51,70Moderate symptoms, unchanged from previous  Rash acneiform Grade 0 -    Vomiting Grade 0 -  Non-reportable AEs (unsolicited, < Grade 3):  Leukopenia (grade 1)  Moderate pain (grade 2) ? Back pain  Mild pain (grade 1)  Headache  Left lower abdominal pain  Bone pain  Glaucoma (1)  Weight Loss (1)  Foye Spurling, BSN, Therapist, sports Nurse 01/22/2021

## 2021-01-22 NOTE — Assessment & Plan Note (Signed)
Her abdominal wall pain is unrelated to her treatment of her disease It is likely muscular injury I recommend conservative

## 2021-02-04 DIAGNOSIS — H40022 Open angle with borderline findings, high risk, left eye: Secondary | ICD-10-CM | POA: Diagnosis not present

## 2021-02-04 DIAGNOSIS — H04123 Dry eye syndrome of bilateral lacrimal glands: Secondary | ICD-10-CM | POA: Diagnosis not present

## 2021-02-04 DIAGNOSIS — H401111 Primary open-angle glaucoma, right eye, mild stage: Secondary | ICD-10-CM | POA: Diagnosis not present

## 2021-02-04 DIAGNOSIS — E119 Type 2 diabetes mellitus without complications: Secondary | ICD-10-CM | POA: Diagnosis not present

## 2021-02-09 ENCOUNTER — Other Ambulatory Visit: Payer: Self-pay | Admitting: Hematology and Oncology

## 2021-02-09 DIAGNOSIS — C9001 Multiple myeloma in remission: Secondary | ICD-10-CM

## 2021-02-14 DIAGNOSIS — E039 Hypothyroidism, unspecified: Secondary | ICD-10-CM | POA: Diagnosis not present

## 2021-02-14 DIAGNOSIS — I10 Essential (primary) hypertension: Secondary | ICD-10-CM | POA: Diagnosis not present

## 2021-02-14 DIAGNOSIS — K219 Gastro-esophageal reflux disease without esophagitis: Secondary | ICD-10-CM | POA: Diagnosis not present

## 2021-02-14 DIAGNOSIS — D508 Other iron deficiency anemias: Secondary | ICD-10-CM | POA: Diagnosis not present

## 2021-02-14 DIAGNOSIS — E1169 Type 2 diabetes mellitus with other specified complication: Secondary | ICD-10-CM | POA: Diagnosis not present

## 2021-02-14 DIAGNOSIS — E119 Type 2 diabetes mellitus without complications: Secondary | ICD-10-CM | POA: Diagnosis not present

## 2021-02-19 ENCOUNTER — Other Ambulatory Visit: Payer: Self-pay

## 2021-02-19 ENCOUNTER — Inpatient Hospital Stay: Payer: Medicare Other | Attending: Hematology and Oncology

## 2021-02-19 ENCOUNTER — Telehealth: Payer: Self-pay | Admitting: *Deleted

## 2021-02-19 ENCOUNTER — Encounter: Payer: Self-pay | Admitting: Hematology and Oncology

## 2021-02-19 ENCOUNTER — Encounter: Payer: Self-pay | Admitting: *Deleted

## 2021-02-19 DIAGNOSIS — C9001 Multiple myeloma in remission: Secondary | ICD-10-CM

## 2021-02-19 DIAGNOSIS — Z006 Encounter for examination for normal comparison and control in clinical research program: Secondary | ICD-10-CM | POA: Insufficient documentation

## 2021-02-19 LAB — CBC WITH DIFFERENTIAL (CANCER CENTER ONLY)
Abs Immature Granulocytes: 0.01 10*3/uL (ref 0.00–0.07)
Basophils Absolute: 0 10*3/uL (ref 0.0–0.1)
Basophils Relative: 1 %
Eosinophils Absolute: 0.1 10*3/uL (ref 0.0–0.5)
Eosinophils Relative: 2 %
HCT: 36.5 % (ref 36.0–46.0)
Hemoglobin: 11.5 g/dL — ABNORMAL LOW (ref 12.0–15.0)
Immature Granulocytes: 0 %
Lymphocytes Relative: 52 %
Lymphs Abs: 2 10*3/uL (ref 0.7–4.0)
MCH: 23 pg — ABNORMAL LOW (ref 26.0–34.0)
MCHC: 31.5 g/dL (ref 30.0–36.0)
MCV: 73.1 fL — ABNORMAL LOW (ref 80.0–100.0)
Monocytes Absolute: 0.5 10*3/uL (ref 0.1–1.0)
Monocytes Relative: 14 %
Neutro Abs: 1.2 10*3/uL — ABNORMAL LOW (ref 1.7–7.7)
Neutrophils Relative %: 31 %
Platelet Count: 180 10*3/uL (ref 150–400)
RBC: 4.99 MIL/uL (ref 3.87–5.11)
RDW: 14.6 % (ref 11.5–15.5)
WBC Count: 3.9 10*3/uL — ABNORMAL LOW (ref 4.0–10.5)
nRBC: 0 % (ref 0.0–0.2)

## 2021-02-19 MED ORDER — LENALIDOMIDE 5 MG PO CAPS: ORAL_CAPSULE | ORAL | 0 refills | Status: DC

## 2021-02-19 NOTE — Research (Signed)
Research - [facility] ECOG-ACRIN [id number] Maintenance Cycle 80, Day 1  Patient into clinic unaccompanied today for evaluation prior to beginning treatment Cycle 80 of revlimid. This RN met with patient after her lab appointment.  CBC was drawn per protocol.  Maintenance therapy with Revlimid:  Patient returned completed cycle 79 Medication Calendar, confirming dosing at dose level -3, Revlimid 5 mg every other day, Days 1-21. Patient notes that no doses were missed. Patient given printed appointment calendar with Cycle 80 treatment days marked, to document doses for the next treatment cycle, scheduled to begin today.  Adverse Events:  Patient reports ongoing fatigue, not limiting self-care (grade 2). She reports ongoing moderate difficulty sleeping (grade 2) with occasional use of trazodone.  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  Patient reports new moderate neck pain in the middle back of her neck (grade 2). She describes as stiffness and feeling like "kink" in the neck after sleeping wrong on a pillow. It started 9 days ago and has continued intermittently. She has taken methocarbamol with some relief.  Patient also notes daily ongiong moderate back ache which she continues to take methocarbamol every few days (grade 2). Patient reports ongoing intermittent left side pain left lower abdomen which she has also taken methocarbamol x 1 (grade 2).  Patient continues to exercise regularly swimming laps in the pool 3 times weekly on average  Adverse Event Log Study/Protocol: [facility] ECOG E1A11 Maintenance Cycle 79:01/22/21-02/19/21(end of cycle 79- 4/49/7530) Solicited&/or Reportable Events Grade Comments  Anemia Grade1   Hyperglycemia Grade 0   Lymphocyte count decreased Grade 0   Neutrophil count decreased Grade 2    Platelet count decreased Grade 0   Diarrhea Grade0   Dyspnea Grade 0   Edema: limbs Grade 0   Fatigue Grade 2  Moderate, occasionally limiting  ADLs(unchanged from previous)  Fever Grade 0   Insomnia   Grade2  Unchanged  Irritability Grade 0   Muscle weakness trunk Grade 0   Nausea Grade 0   Peripheral motor neuropathy Grade 0   Peripheral sensory neuropathy Grade 2 Moderate symptoms, unchanged fromprevious  Rash acneiform Grade 0   Vomiting Grade 0    Non-reportable AEs (unsolicited, < Grade 3):  Leukopenia (grade 1)  Moderate pain (grade 2) ? Back pain ? Left lower abdominal pain ? Neck pain  Mild pain (grade 1)  Headache  Bone pain  Plan: This research nurse will call patient after review of today's lab results and notify if okay to start cycle 80 of revlimid today as planned. Patient will continue with monthly clinic visit on 03/19/21 for hematology and AE assessment prior to starting cycle 81. Patient verbalized understanding and in agreement with this plan.  Foye Spurling, BSN, RN Clinical Research Nurse 02/19/2021

## 2021-02-19 NOTE — Telephone Encounter (Signed)
Notified patient's husband that CBC results are within parameters for patient to start next cycle of Revlimid today.  He verbalized understanding and will pass message to patient.  Foye Spurling, BSN, RN Clinical Research Nurse 02/19/2021

## 2021-03-06 DIAGNOSIS — D508 Other iron deficiency anemias: Secondary | ICD-10-CM | POA: Diagnosis not present

## 2021-03-06 DIAGNOSIS — E119 Type 2 diabetes mellitus without complications: Secondary | ICD-10-CM | POA: Diagnosis not present

## 2021-03-06 DIAGNOSIS — E1169 Type 2 diabetes mellitus with other specified complication: Secondary | ICD-10-CM | POA: Diagnosis not present

## 2021-03-06 DIAGNOSIS — E039 Hypothyroidism, unspecified: Secondary | ICD-10-CM | POA: Diagnosis not present

## 2021-03-06 DIAGNOSIS — K219 Gastro-esophageal reflux disease without esophagitis: Secondary | ICD-10-CM | POA: Diagnosis not present

## 2021-03-06 DIAGNOSIS — I200 Unstable angina: Secondary | ICD-10-CM | POA: Diagnosis not present

## 2021-03-06 DIAGNOSIS — I10 Essential (primary) hypertension: Secondary | ICD-10-CM | POA: Diagnosis not present

## 2021-03-09 ENCOUNTER — Other Ambulatory Visit: Payer: Self-pay | Admitting: Hematology and Oncology

## 2021-03-09 DIAGNOSIS — C9001 Multiple myeloma in remission: Secondary | ICD-10-CM

## 2021-03-19 ENCOUNTER — Inpatient Hospital Stay: Payer: Medicare Other | Attending: Hematology and Oncology

## 2021-03-19 ENCOUNTER — Other Ambulatory Visit: Payer: Self-pay

## 2021-03-19 ENCOUNTER — Other Ambulatory Visit: Payer: Self-pay | Admitting: *Deleted

## 2021-03-19 ENCOUNTER — Encounter: Payer: Self-pay | Admitting: Hematology and Oncology

## 2021-03-19 ENCOUNTER — Encounter: Payer: Self-pay | Admitting: *Deleted

## 2021-03-19 DIAGNOSIS — C9001 Multiple myeloma in remission: Secondary | ICD-10-CM | POA: Insufficient documentation

## 2021-03-19 DIAGNOSIS — Z006 Encounter for examination for normal comparison and control in clinical research program: Secondary | ICD-10-CM | POA: Insufficient documentation

## 2021-03-19 LAB — CBC WITH DIFFERENTIAL (CANCER CENTER ONLY)
Abs Immature Granulocytes: 0.01 10*3/uL (ref 0.00–0.07)
Basophils Absolute: 0 10*3/uL (ref 0.0–0.1)
Basophils Relative: 1 %
Eosinophils Absolute: 0.1 10*3/uL (ref 0.0–0.5)
Eosinophils Relative: 2 %
HCT: 37.9 % (ref 36.0–46.0)
Hemoglobin: 11.5 g/dL — ABNORMAL LOW (ref 12.0–15.0)
Immature Granulocytes: 0 %
Lymphocytes Relative: 56 %
Lymphs Abs: 2.2 10*3/uL (ref 0.7–4.0)
MCH: 22.8 pg — ABNORMAL LOW (ref 26.0–34.0)
MCHC: 30.3 g/dL (ref 30.0–36.0)
MCV: 75 fL — ABNORMAL LOW (ref 80.0–100.0)
Monocytes Absolute: 0.5 10*3/uL (ref 0.1–1.0)
Monocytes Relative: 13 %
Neutro Abs: 1.1 10*3/uL — ABNORMAL LOW (ref 1.7–7.7)
Neutrophils Relative %: 28 %
Platelet Count: 183 10*3/uL (ref 150–400)
RBC: 5.05 MIL/uL (ref 3.87–5.11)
RDW: 15.1 % (ref 11.5–15.5)
WBC Count: 3.9 10*3/uL — ABNORMAL LOW (ref 4.0–10.5)
nRBC: 0 % (ref 0.0–0.2)

## 2021-03-19 MED ORDER — LENALIDOMIDE 5 MG PO CAPS: ORAL_CAPSULE | ORAL | 0 refills | Status: DC

## 2021-03-19 NOTE — Addendum Note (Signed)
Addended by: Ricarda Frame C on: 03/19/2021 09:16 AM   Modules accepted: Orders

## 2021-03-19 NOTE — Research (Addendum)
Research - [facility] ECOG-ACRIN [id number] Maintenance Cycle 81, Day 1  Patient into clinic unaccompanied today for evaluation prior to beginning treatment Cycle 81 of revlimid. This RN met with patient prior to her lab appointment.  CBC was drawn per protocol.  Maintenance therapy with Revlimid:  Patient returned completed cycle 80 Medication Calendar, confirming dosing at dose level -3, Revlimid 5 mg every other day, Days 1-21. Patient notes that no doses were missed. Patient given printed appointment calendar with Cycle 81 treatment days marked, to document doses for the next treatment cycle, scheduled to begin today.  Adverse Events:  Patient reports ongoing fatigue (grade 2) not limiting self-care. She reports ongoing moderate difficulty sleeping (grade 2) with occasional use of trazodone.  Peripheral sensory neuropathy (grade 2) is unchanged, moderate in nature . Patient also reports 2 days with several episodes of diarrhea (grade 1). She did not take anything for it and it resolved on it's own each time. Patient reports ongoing occasional neck pain (grade 2) along with more frequent almost daily moderate back ache (grade 2) which she continues to take methocarbamol every few days.  She also reports new moderate right breast pain (grade 2) x 1 which was relieved with methocarbamol. Patient notes mild bone pain (grade 1) x 1 day. Patient also notes migraine (grade 2) x 1 day which she took tramadol.  Patient continues to exercise regularly swimming laps in the pool 3 times weekly on average  Adverse Event Log Study/Protocol: [facility] ECOG E1A11 Maintenance Cycle 80:02/19/21-03/19/21(end of cycle 80- 1/51/7616) Solicited&/or Reportable Events Grade Comments  Anemia Grade1   Hyperglycemia Grade 0   Lymphocyte count decreased Grade 0   Neutrophil count decreased Grade 2    Platelet count decreased Grade 0   Diarrhea Grade1   Dyspnea Grade 0   Edema: limbs Grade 0   Fatigue Grade 2  Moderate,  occasionally limiting ADLs(unchanged from previous)  Fever Grade 0   Insomnia   Grade2  Unchanged  Irritability Grade 0   Muscle weakness trunk Grade 0   Nausea Grade 0   Peripheral motor neuropathy Grade 0   Peripheral sensory neuropathy Grade 2 Moderate symptoms, unchanged fromprevious  Rash acneiform Grade 0   Vomiting Grade 0    Non-reportable AEs (unsolicited, < Grade 3):  Leukopenia (grade 1)  Moderate pain (grade 2) ? Back pain ? Neck pain ? Right breast pain ? Migraine  Mild pain (grade 1)  Bone pain  Plan: This research nurse will call patient after review of today's lab results and notify if okay to start cycle 81 of revlimid today as planned. Patient will continue with monthly clinic visit on 04/09/21 for lab work one week prior to study assessments for cycle 82. She is then scheduled to return on 04/16/21 to see Dr. Alvy Bimler prior to starting on cycle 82. Patient verbalized understanding and in agreement with this plan.  Foye Spurling, BSN, RN Clinical Research Nurse 03/19/2021   Called patient's home number and spoke with her husband Arnell Sieving. Informed him patient's labs are okay to start this cycle of Revlimid today.  He verbalized understanding and states will give message to patient.  Foye Spurling, BSN, RN Clinical Research Nurse 03/19/2021 8:36 AM

## 2021-03-19 NOTE — Addendum Note (Signed)
Addended by: Ricarda Frame C on: 03/19/2021 09:24 AM   Modules accepted: Orders

## 2021-04-06 ENCOUNTER — Other Ambulatory Visit: Payer: Self-pay | Admitting: Hematology and Oncology

## 2021-04-06 DIAGNOSIS — C9001 Multiple myeloma in remission: Secondary | ICD-10-CM

## 2021-04-08 NOTE — Telephone Encounter (Signed)
Pls refill electronically °

## 2021-04-09 ENCOUNTER — Inpatient Hospital Stay: Payer: Medicare Other | Attending: Hematology and Oncology

## 2021-04-09 ENCOUNTER — Other Ambulatory Visit: Payer: Self-pay

## 2021-04-09 DIAGNOSIS — D630 Anemia in neoplastic disease: Secondary | ICD-10-CM | POA: Insufficient documentation

## 2021-04-09 DIAGNOSIS — C9001 Multiple myeloma in remission: Secondary | ICD-10-CM | POA: Diagnosis present

## 2021-04-09 DIAGNOSIS — Z006 Encounter for examination for normal comparison and control in clinical research program: Secondary | ICD-10-CM | POA: Insufficient documentation

## 2021-04-09 DIAGNOSIS — Z79899 Other long term (current) drug therapy: Secondary | ICD-10-CM | POA: Insufficient documentation

## 2021-04-09 DIAGNOSIS — T451X5A Adverse effect of antineoplastic and immunosuppressive drugs, initial encounter: Secondary | ICD-10-CM | POA: Insufficient documentation

## 2021-04-09 DIAGNOSIS — M62838 Other muscle spasm: Secondary | ICD-10-CM | POA: Insufficient documentation

## 2021-04-09 DIAGNOSIS — D701 Agranulocytosis secondary to cancer chemotherapy: Secondary | ICD-10-CM | POA: Insufficient documentation

## 2021-04-09 LAB — CBC WITH DIFFERENTIAL (CANCER CENTER ONLY)
Abs Immature Granulocytes: 0.01 10*3/uL (ref 0.00–0.07)
Basophils Absolute: 0 10*3/uL (ref 0.0–0.1)
Basophils Relative: 1 %
Eosinophils Absolute: 0.1 10*3/uL (ref 0.0–0.5)
Eosinophils Relative: 2 %
HCT: 37.1 % (ref 36.0–46.0)
Hemoglobin: 11.3 g/dL — ABNORMAL LOW (ref 12.0–15.0)
Immature Granulocytes: 0 %
Lymphocytes Relative: 48 %
Lymphs Abs: 1.4 10*3/uL (ref 0.7–4.0)
MCH: 22.7 pg — ABNORMAL LOW (ref 26.0–34.0)
MCHC: 30.5 g/dL (ref 30.0–36.0)
MCV: 74.6 fL — ABNORMAL LOW (ref 80.0–100.0)
Monocytes Absolute: 0.4 10*3/uL (ref 0.1–1.0)
Monocytes Relative: 14 %
Neutro Abs: 1 10*3/uL — ABNORMAL LOW (ref 1.7–7.7)
Neutrophils Relative %: 35 %
Platelet Count: 223 10*3/uL (ref 150–400)
RBC: 4.97 MIL/uL (ref 3.87–5.11)
RDW: 14.9 % (ref 11.5–15.5)
WBC Count: 3 10*3/uL — ABNORMAL LOW (ref 4.0–10.5)
nRBC: 0 % (ref 0.0–0.2)

## 2021-04-09 LAB — CMP (CANCER CENTER ONLY)
ALT: 27 U/L (ref 0–44)
AST: 23 U/L (ref 15–41)
Albumin: 3.6 g/dL (ref 3.5–5.0)
Alkaline Phosphatase: 50 U/L (ref 38–126)
Anion gap: 8 (ref 5–15)
BUN: 12 mg/dL (ref 8–23)
CO2: 26 mmol/L (ref 22–32)
Calcium: 9.3 mg/dL (ref 8.9–10.3)
Chloride: 105 mmol/L (ref 98–111)
Creatinine: 0.92 mg/dL (ref 0.44–1.00)
GFR, Estimated: >60 mL/min (ref >60)
Glucose, Bld: 83 mg/dL (ref 70–99)
Potassium: 3.8 mmol/L (ref 3.5–5.1)
Sodium: 139 mmol/L (ref 135–145)
Total Bilirubin: 0.8 mg/dL (ref 0.3–1.2)
Total Protein: 7.2 g/dL (ref 6.5–8.1)

## 2021-04-09 LAB — TSH: TSH: 1.323 u[IU]/mL (ref 0.308–3.960)

## 2021-04-09 LAB — LACTATE DEHYDROGENASE: LDH: 166 U/L (ref 98–192)

## 2021-04-10 ENCOUNTER — Telehealth: Payer: Self-pay | Admitting: *Deleted

## 2021-04-10 ENCOUNTER — Telehealth: Payer: Self-pay

## 2021-04-10 LAB — KAPPA/LAMBDA LIGHT CHAINS
Kappa free light chain: 26.3 mg/L — ABNORMAL HIGH (ref 3.3–19.4)
Kappa, lambda light chain ratio: 1.38 (ref 0.26–1.65)
Lambda free light chains: 19.1 mg/L (ref 5.7–26.3)

## 2021-04-10 NOTE — Telephone Encounter (Signed)
Patient left VM stating her pharmacy needs the authorization number from our office to refill her Revlimid. Message given to Dr. Calton Dach nurse, Cloyde Reams.  Patient notified and she will check with the pharmacy later this afternoon to arrange delivery.  Foye Spurling, BSN, RN Clinical Research Nurse 04/10/2021 9:22 AM

## 2021-04-10 NOTE — Telephone Encounter (Signed)
Authorization number of [id number] for pt's Revlimid called to pharmacist with [facility]

## 2021-04-11 ENCOUNTER — Telehealth: Payer: Self-pay | Admitting: *Deleted

## 2021-04-11 NOTE — Telephone Encounter (Signed)
Called patient and let her know a new Authorization number has been faxed to the pharmacy. Patient says she will check with pharmacy and call back if any problems.  Foye Spurling, BSN, RN Clinical Research Nurse 04/11/2021 9:50 AM

## 2021-04-11 NOTE — Telephone Encounter (Signed)
Patient called to report that her pharmacy says they received the wrong authorization number for Revlimid and need a new one. VM left for Dr. Calton Dach collaborative nurse.  Foye Spurling, BSN, RN Clinical Research Nurse 04/11/2021 9:03 AM

## 2021-04-15 LAB — MULTIPLE MYELOMA PANEL, SERUM
Albumin SerPl Elph-Mcnc: 3.4 g/dL (ref 2.9–4.4)
Albumin/Glob SerPl: 1.1 (ref 0.7–1.7)
Alpha 1: 0.2 g/dL (ref 0.0–0.4)
Alpha2 Glob SerPl Elph-Mcnc: 0.5 g/dL (ref 0.4–1.0)
B-Globulin SerPl Elph-Mcnc: 1 g/dL (ref 0.7–1.3)
Gamma Glob SerPl Elph-Mcnc: 1.5 g/dL (ref 0.4–1.8)
Globulin, Total: 3.2 g/dL (ref 2.2–3.9)
IgA: 119 mg/dL (ref 87–352)
IgG (Immunoglobin G), Serum: 1738 mg/dL — ABNORMAL HIGH (ref 586–1602)
IgM (Immunoglobulin M), Srm: 51 mg/dL (ref 26–217)
Total Protein ELP: 6.6 g/dL (ref 6.0–8.5)

## 2021-04-16 ENCOUNTER — Telehealth: Payer: Self-pay | Admitting: Hematology and Oncology

## 2021-04-16 ENCOUNTER — Inpatient Hospital Stay: Payer: Medicare Other | Admitting: Hematology and Oncology

## 2021-04-16 ENCOUNTER — Other Ambulatory Visit: Payer: Self-pay | Admitting: *Deleted

## 2021-04-16 ENCOUNTER — Encounter: Payer: Self-pay | Admitting: Hematology and Oncology

## 2021-04-16 ENCOUNTER — Encounter: Payer: Self-pay | Admitting: *Deleted

## 2021-04-16 ENCOUNTER — Other Ambulatory Visit: Payer: Self-pay

## 2021-04-16 DIAGNOSIS — D701 Agranulocytosis secondary to cancer chemotherapy: Secondary | ICD-10-CM

## 2021-04-16 DIAGNOSIS — Z006 Encounter for examination for normal comparison and control in clinical research program: Secondary | ICD-10-CM

## 2021-04-16 DIAGNOSIS — C9001 Multiple myeloma in remission: Secondary | ICD-10-CM | POA: Diagnosis not present

## 2021-04-16 DIAGNOSIS — T451X5A Adverse effect of antineoplastic and immunosuppressive drugs, initial encounter: Secondary | ICD-10-CM

## 2021-04-16 DIAGNOSIS — M62838 Other muscle spasm: Secondary | ICD-10-CM | POA: Diagnosis not present

## 2021-04-16 DIAGNOSIS — D630 Anemia in neoplastic disease: Secondary | ICD-10-CM | POA: Diagnosis not present

## 2021-04-16 MED ORDER — METHOCARBAMOL 500 MG PO TABS: 500.0000 mg | ORAL_TABLET | Freq: Three times a day (TID) | ORAL | 1 refills | Status: DC | PRN

## 2021-04-16 MED ORDER — LENALIDOMIDE 5 MG PO CAPS CTSU E1A11: ORAL_CAPSULE | ORAL | 0 refills | Status: DC

## 2021-04-16 NOTE — Research (Signed)
Research - [facility] ECOG-ACRIN [id number] Maintenance Cycle 82, Day 1  Patient into clinic unaccompanied today for evaluation prior to beginning treatment Cycle 82 of revlimid.   Labs w/myeloma panel: Obtained on 04/09/21 within 7-day window per protocol, to allow adequate time for results prior to treatment. Results reviewed by Dr. Alvy Bimler and found to be within parameters for continued dosing at Maintenance phase Dose Level -3, Revlimid 5 mg every other day for 21 days, every 28 days. 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 Per MD, bone marrow biopsy and aspirate would not be considered standard of care at this time.   History and physical exam: See MD note dated today.     Adverse Events Since Last Visit (in the past month): Patient reports ongoing fatigue (grade 2) not limiting self-care.   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 Patient notes mild bone pain (grade 1) x 3 days.  Daily moderate back ache (grade 2) is ongoing, which she continues to take methocarbamol every few days. Patient denies diarrhea and insomnia. She did not report any breast pain,  neck pain, or migraines.  See AE table below for cycles 79-81. Patient continues to exercise regularly swimming laps in the pool 3 times weekly on average.   Maintenance therapy with Revlimid: Based on lab results review and history and physical by Dr. Alvy Bimler, patient meets criteria for continued treatment with Revlimid, which she is in agreement to do. Patient is aware that treatment will continue indefinitely, unless there is evidence of disease recurrence, adverse effects requiring treatment discontinuation, or patient/physician decision to discontinue therapy. Patient returned completed cycle 81 Medication Calendar, confirming dosing at dose level -3, Revlimid 64m every other day, Days 1-21. Patient notes that no doses were missed. Patient given printed appointment calendar with Cycle 82  treatment days marked, to document doses for the next treatment cycle, scheduled to begin today.  Plan: Patient will continue with monthly clinic visits. These are due on 05/14/21 and 06/11/21 for hematology and AE assessments prior to beginning subsequent treatment cycles. Myeloma panel and other labs, to be performed on 07/02/2021 in advance of the next MD clinic visit at the end of Cycle 84, scheduled for 07/09/2021. Patient will be out of town on 05/14/21 and unable to come into clinic for labs prior to starting on cycle 83 until 05/16/21. Lab will be rescheduled to 05/16/21. Instructed patient to not start this cycle of revlimid until after study assessments including labs completed and instructed okay to start by research nurse. She verbalized understanding.   Adverse Event Log Study/Protocol: [facility] ECOG E1A11 Maintenance Cycles 79-81: 01/22/2021-04/16/2021  (end of Cycle 81 = [id number] Solicited &/or Reportable Events Grade Attribution to lenalidomide Cycle # Comments  Anemia Grade 1 Definite 79,80,81    Hyperglycemia Grade 0 -    Lymphocyte count decreased Grade 0 -    Neutrophil count decreased Grade 2  Definite 79,80,81   Platelet count decreased Grade 0 -     Diarrhea Grade 1 Possible 80   Dyspnea Grade 0 -    Edema: limbs Grade 0 -    Fatigue  Grade 2  Probable 79,80,81 Moderate, occasionally limiting ADLs (unchanged from previous)  Fever Grade 0 -    Insomnia  Grade 2   Unlikely 79,80 Trazodone PRN  Irritability Grade 0 -    Muscle weakness trunk Grade 0 -    Nausea Grade 0 -    Peripheral motor neuropathy Grade 0 -  Peripheral sensory neuropathy  Grade 2 Unlikely 79,80,81 Moderate symptoms, unchanged from previous  Rash acneiform Grade 0 -    Vomiting Grade 0 -     Non-reportable AEs (unsolicited, < Grade 3):  Leukopenia (grade 1)  Moderate pain (grade 2) ? Back pain ? Neck pain ? Right breast pain ? Migraine ? Left lower abdominal pain  Mild pain (grade  1)  Headache  Bone pain   Foye Spurling, BSN, RN Clinical Research Nurse 04/16/2021

## 2021-04-16 NOTE — Assessment & Plan Note (Signed)

## 2021-04-16 NOTE — Assessment & Plan Note (Signed)
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 She will continue treatment per research protocol She will continue calcium with vitamin D supplement and aspirin for DVT prophylaxis 

## 2021-04-16 NOTE — Telephone Encounter (Signed)
Scheduled appointment per 05/17 scheduled message. Patient received calender

## 2021-04-16 NOTE — Assessment & Plan Note (Signed)
She has significant muscle spasm on a regular basis This is not related to her treatment or her disease I have refilled her prescription methocarbamol

## 2021-04-16 NOTE — Progress Notes (Signed)
[facility] OFFICE PROGRESS NOTE  Patient Care Team: Wenda Low, MD as PCP - General (Internal Medicine) Heath Lark, MD as Consulting Physician (Hematology and Oncology)  ASSESSMENT & PLAN:  Multiple myeloma in remission [facility]) Her recent myeloma panel showed that she is in remission The patient is comfortable to remain on Revlimid indefinitely. She will continue treatment per research protocol She will continue calcium with vitamin D supplement and aspirin for DVT prophylaxis  Anemia in neoplastic disease This is likely due to recent treatment. The patient denies recent history of bleeding such as epistaxis, hematuria or hematochezia. She is asymptomatic from the anemia. I will observe for now.  She does not require transfusion now. I will continue the chemotherapy at current dose without dosage adjustment.  If the anemia gets progressive worse in the future, I might have to delay her treatment or adjust the chemotherapy dose.   Chemotherapy induced neutropenia ([place]) This is likely due to recent treatment. The patient denies recent history of fevers, cough, chills, diarrhea or dysuria. She is asymptomatic from the leukopenia. I will observe for now.  I will continue the chemotherapy at current dose without dosage adjustment.  If the leukopenia gets progressive worse in the future, I might have to delay her treatment or adjust the chemotherapy dose.    Muscle spasm She has significant muscle spasm on a regular basis This is not related to her treatment or her disease I have refilled her prescription methocarbamol   No orders of the defined types were placed in this encounter.   All questions were answered. The patient knows to call the clinic with any problems, questions or concerns. The total time spent in the appointment was 20 minutes encounter with patients including review of chart and various tests results, discussions about plan of care and coordination of  care plan   Heath Lark, MD 04/16/2021 11:18 AM  INTERVAL HISTORY: Please see below for problem oriented charting. She returns for treatment and follow-up She is doing well No recent infection, fever or chills No new side effects from treatment No new bone pain She has some mild muscle spasm that she takes her medication regularly but she is still able to exercise  SUMMARY OF ONCOLOGIC HISTORY: Oncology History Overview Note  ISS stage 1 IgG lambda subtype (serum albumin 3.6, Beta2 microglobulin 2.32) Durie Salmon Stage 1   Multiple myeloma in remission ([place])  10/10/2013 Imaging   Skeletal survery was negative   11/09/2013 Bone Marrow Biopsy   BM biopsy confirmed myeloma, 76% involved, IgG lambda subtype   12/06/2013 - 08/29/2014 Chemotherapy   Sh received chemo with revlimid, Velcade, Dexamethasone and Zometa. 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   05/05/2014 Imaging   Imaging study of the neck showed no explanation that could cause right neck pain. She is noted to have incidental left upper lung nodule. Plan to repeat imaging study in 3 months.   09/06/2014 Imaging   Bone survey showed no evidence of fracture   09/14/2014 Bone Marrow Biopsy   Bone marrow biopsy showed 8% residual plasma cells by manual count but none on the biopsy specimen   09/26/2014 -  Chemotherapy   She is started on cycle 1 of maintenance Revlimid   01/31/2015 Imaging    chest x-ray showed pneumonia. Treatment  was placed on hold.   05/03/2015 Bone Marrow Biopsy   Accession: [id number] repeat bone marrow aspirate and biopsy show 5% residual plasma cells   10/14/2016 Bone Marrow Biopsy   Bone marrow biopsy showed the plasma cells represent 4% of all cells with lack of large aggregates or sheets. To assess the plasma cell  clonality, immunohistochemical stains is performed and it lack clonality. Normal cytogenetics and FISH   01/09/2017 Imaging   CT chest showed ground-glass 1.5 cm apical left upper lobe pulmonary nodule. Initial follow-up with CT at 6-12 months is recommended to confirm persistence. If persistent, repeat CT is recommended every 2 years until 5 years of stability has been established. This recommendation follows the consensus statement: Guidelines for Management of Incidental Pulmonary Nodules Detected on CT Images: From the Fleischner Society 2017; Radiology 2017; 284:228-243. 2. Mild patchy ground-glass opacities in the right upper lobe, probably inflammatory, which can also be reassessed on follow-up chest CT performed for the above dominant ground-glass nodule. 3. Solitary 3 mm right lower lobe solid pulmonary nodule, which can also be reassessed on follow-up chest CT performed for the above dominant ground-glass nodule. 4. No thoracic adenopathy. 5. Aortic atherosclerosis. Two-vessel coronary atherosclerosis.   06/30/2017 Imaging   1. No interval change in the 11 x 13 mm ground-glass nodule left upper lobe. Given the nearly 6 months of imaging stability, repeat CT is recommended every 2 years until 5 years of stability has been established. This recommendation follows the consensus statement: Guidelines for Management of Incidental Pulmonary Nodules Detected on CT Images: From the Fleischner Society 2017; Radiology 2017; 284:228-243. 2. Interval resolution of the tiny patchy ground-glass nodules right upper lobe, likely infectious/inflammatory etiology. 3. Stable 3 mm right lower lobe pulmonary nodule. Attention on follow-up recommended. 4. Aortic Atherosclerois (ICD10-170.0)   09/29/2017 Imaging   Skeletal survey 1. Questionable new tiny lucency noted the posterior portion of C3 vertebral body. This may represent a small lytic lesion.  2. No definite thoracic lesion noted on today's exam.  Stable lucencies in the left ilium and acetabulum.  3. No other focal abnormalities identified. The left hip is unremarkable .     REVIEW OF SYSTEMS:   Constitutional: Denies fevers, chills or abnormal weight loss Eyes: Denies blurriness of vision Ears, nose, mouth, throat, and face: Denies mucositis or sore throat Respiratory: Denies cough, dyspnea or wheezes Cardiovascular: Denies palpitation, chest discomfort or lower extremity swelling Gastrointestinal:  Denies nausea, heartburn or change in bowel habits Skin: Denies abnormal skin rashes Lymphatics: Denies new lymphadenopathy or easy bruising Neurological:Denies numbness, tingling or new weaknesses Behavioral/Psych: Mood is stable, no new changes  All other systems were reviewed with the patient and are negative.  I have reviewed the past medical history, past surgical history, social history and family history with the patient and they are unchanged from previous note.  ALLERGIES:  has No Known Allergies.  MEDICATIONS:  Current Outpatient Medications  Medication Sig Dispense Refill  . aspirin 325 MG tablet Take 325 mg by mouth daily.     Marland Kitchen CANNABIDIOL PO Take 500 mg by mouth as needed.    . Cholecalciferol (VITAMIN D3) 2000 UNITS TABS Take 2,000 Units by mouth daily.    . dorzolamide-timolol (COSOPT) 22.3-6.8 MG/ML ophthalmic solution Place 1 drop into both eyes 2 (two) times daily.    . hydrochlorothiazide (HYDRODIURIL) 25 MG tablet Take 25 mg by mouth every evening.     Marland Kitchen lenalidomide (REVLIMID) 5 MG capsule TAKE 1 CAPSULE BY MOUTH  EVERY OTHER DAY FOR 21 DAYS ON THEN 7 DAYS OFF 11 capsule 0  . levothyroxine (SYNTHROID, LEVOTHROID) 75 MCG tablet Take 75 mcg by mouth daily before breakfast.    . methocarbamol (ROBAXIN) 500 MG tablet Take 1 tablet (500 mg total) by mouth every 8 (eight) hours as needed for muscle spasms. 90 tablet 1  . Multiple Vitamins-Minerals (CENTRUM SILVER PO) Take 1 tablet by mouth daily.     .  polyethylene glycol (MIRALAX / GLYCOLAX) packet Take 17 g by mouth daily as needed for mild constipation.     . RESTASIS 0.05 % ophthalmic emulsion PLACE 1 DROP IN BOTH EYES IN THE EVENING  4  . rosuvastatin (CRESTOR) 5 MG tablet Take 5 mg by mouth daily at 2 PM.    . sitaGLIPtin (JANUVIA) 50 MG tablet Take 50 mg by mouth daily.    . traZODone (DESYREL) 50 MG tablet TAKE 1 TABLET (50 MG TOTAL) BY MOUTH AT BEDTIME. 90 tablet 3  . venlafaxine XR (EFFEXOR-XR) 150 MG 24 hr capsule Take 1 capsule (150 mg total) by mouth daily with breakfast. 90 capsule 3  . verapamil (VERELAN PM) 120 MG 24 hr capsule Take 1 capsule (120 mg total) by mouth daily. 90 capsule 3   No current facility-administered medications for this visit.   Facility-Administered Medications Ordered in Other Visits  Medication Dose Route Frequency Provider Last Rate Last Admin  . gadopentetate dimeglumine (MAGNEVIST) injection 15 mL  15 mL Intravenous Once PRN Melvenia Beam, MD        PHYSICAL EXAMINATION: ECOG PERFORMANCE STATUS: 1 - Symptomatic but completely ambulatory  Vitals:   04/16/21 0937  BP: 117/63  Pulse: 64  Resp: 18  Temp: (!) 97.4 F (36.3 C)  SpO2: 100%   Filed Weights   04/16/21 0937  Weight: 166 lb 12.8 oz (75.7 kg)    GENERAL:alert, no distress and comfortable SKIN: skin color, texture, turgor are normal, no rashes or significant lesions EYES: normal, Conjunctiva are pink and non-injected, sclera clear OROPHARYNX:no exudate, no erythema and lips, buccal mucosa, and tongue normal  NECK: supple, thyroid normal size, non-tender, without nodularity LYMPH:  no palpable lymphadenopathy in the cervical, axillary or inguinal LUNGS: clear to auscultation and percussion with normal breathing effort HEART: regular rate & rhythm and no murmurs and no lower extremity edema ABDOMEN:abdomen soft, non-tender and normal bowel sounds Musculoskeletal:no cyanosis of digits and no clubbing  NEURO: alert & oriented x  3 with fluent speech, no focal motor/sensory deficits  LABORATORY DATA:  I have reviewed the data as listed    Component Value Date/Time   NA 139 04/09/2021 0720   NA 141 09/29/2017 0830   K 3.8 04/09/2021 0720   K 3.6 09/29/2017 0830   CL 105 04/09/2021 0720   CO2 26 04/09/2021 0720   CO2 29 09/29/2017 0830   GLUCOSE 83 04/09/2021 0720   GLUCOSE 87 09/29/2017 0830   BUN 12 04/09/2021 0720   BUN 14.0 09/29/2017 0830   CREATININE 0.92 04/09/2021 0720   CREATININE 0.9 09/29/2017 0830   CALCIUM 9.3 04/09/2021 0720   CALCIUM 9.6 09/29/2017 0830   PROT 7.2 04/09/2021 0720   PROT 7.3 09/29/2017 0832   PROT 8.0 09/29/2017 0830   ALBUMIN 3.6 04/09/2021 0720   ALBUMIN 3.8 09/29/2017 0830   AST 23 04/09/2021 0720   AST 21 09/29/2017 0830   ALT 27 04/09/2021 0720   ALT 69 (H) 09/29/2017 0830   ALKPHOS 50 04/09/2021 0720  ALKPHOS 60 09/29/2017 0830   BILITOT 0.8 04/09/2021 0720   BILITOT 0.46 09/29/2017 0830   GFRNONAA >60 04/09/2021 0720   GFRAA >60 07/31/2020 0737    No results found for: SPEP, UPEP  Lab Results  Component Value Date   WBC 3.0 (L) 04/09/2021   NEUTROABS 1.0 (L) 04/09/2021   HGB 11.3 (L) 04/09/2021   HCT 37.1 04/09/2021   MCV 74.6 (L) 04/09/2021   PLT 223 04/09/2021      Chemistry      Component Value Date/Time   NA 139 04/09/2021 0720   NA 141 09/29/2017 0830   K 3.8 04/09/2021 0720   K 3.6 09/29/2017 0830   CL 105 04/09/2021 0720   CO2 26 04/09/2021 0720   CO2 29 09/29/2017 0830   BUN 12 04/09/2021 0720   BUN 14.0 09/29/2017 0830   CREATININE 0.92 04/09/2021 0720   CREATININE 0.9 09/29/2017 0830      Component Value Date/Time   CALCIUM 9.3 04/09/2021 0720   CALCIUM 9.6 09/29/2017 0830   ALKPHOS 50 04/09/2021 0720   ALKPHOS 60 09/29/2017 0830   AST 23 04/09/2021 0720   AST 21 09/29/2017 0830   ALT 27 04/09/2021 0720   ALT 69 (H) 09/29/2017 0830   BILITOT 0.8 04/09/2021 0720   BILITOT 0.46 09/29/2017 0830

## 2021-04-16 NOTE — Assessment & Plan Note (Signed)
This is likely due to recent treatment. The patient denies recent history of fevers, cough, chills, diarrhea or dysuria. She is asymptomatic from the leukopenia. I will observe for now.  I will continue the chemotherapy at current dose without dosage adjustment.  If the leukopenia gets progressive worse in the future, I might have to delay her treatment or adjust the chemotherapy dose.   

## 2021-04-22 DIAGNOSIS — E039 Hypothyroidism, unspecified: Secondary | ICD-10-CM | POA: Diagnosis not present

## 2021-04-22 DIAGNOSIS — E1169 Type 2 diabetes mellitus with other specified complication: Secondary | ICD-10-CM | POA: Diagnosis not present

## 2021-04-22 DIAGNOSIS — E559 Vitamin D deficiency, unspecified: Secondary | ICD-10-CM | POA: Diagnosis not present

## 2021-04-22 DIAGNOSIS — I10 Essential (primary) hypertension: Secondary | ICD-10-CM | POA: Diagnosis not present

## 2021-04-22 DIAGNOSIS — I700 Atherosclerosis of aorta: Secondary | ICD-10-CM | POA: Diagnosis not present

## 2021-04-22 DIAGNOSIS — C9001 Multiple myeloma in remission: Secondary | ICD-10-CM | POA: Diagnosis not present

## 2021-04-30 ENCOUNTER — Telehealth: Payer: Self-pay | Admitting: *Deleted

## 2021-04-30 NOTE — Telephone Encounter (Signed)
[id number] Study; Patient called from [place]. She is there visiting her mother. Patient reports last Thursday on 04/25/21 she had a seizure. She states this occurred after smoking marijuana and was witnessed by her brother and sister. Patient is unsure how long it lasted. She states she does not remember the event but did remember where she was when she became conscious again. Her sister told her she was shaking and stopped breathing. Her family called 911 and sister performed "mouth to mouth."  Patient "woke up" shortly afterwards and they canceled the 911 call since patient was awake and seemed okay. Patient has not sought any medical attention since this event and has not had any further episodes. She says she is not going to smoke any more marijuana. Patient is on day 15 of current Revlimid cycle. She has not taken her dose yet at the time she called.  Instructed patient to hold dose until discussed with Dr. Alvy Bimler and she verbalized understanding.  Informed Dr. Alvy Bimler of above and she states the seizure is "unrelated" to Revlimid and okay for patient to continue to take Revlimid as prescribed. Dr. Alvy Bimler also instructs patient to seek medical attention immediately for the seizure. If patient is unwilling to seek medical attention now while in [place], then she should at the least call her Neurologist here in [place] to report the seizure and set up an appointment for when she returns to town.  Called patient back and instructed her to seek medical attention now for the seizure. Patient states she does not want to get medical attention now. She agreed to contact her neurologist for an appointment as soon as possible after she returns to [place]. Patient also agreed to go to ED if she does have another seizure or similar event in the meantime. Also informed patient okay to continue this cycle of Revlimid. She verbalized understanding.  Reminded patient of lab appointment June 16th after she returns  from her trip and to not start next cycle of Revlimid until after lab results. She verbalized understanding.  Foye Spurling, BSN, RN Clinical Research Nurse 04/30/2021 12:42 PM

## 2021-05-01 ENCOUNTER — Telehealth: Payer: Self-pay | Admitting: *Deleted

## 2021-05-01 DIAGNOSIS — R569 Unspecified convulsions: Secondary | ICD-10-CM

## 2021-05-01 NOTE — Telephone Encounter (Signed)
Pt left VM requesting a referral to see her neurologist Dr. Sarina Ill at [facility]. She says she called to make an appointment and was told she needs a new referral for the seizure she reported she had last week.  Foye Spurling, BSN, RN Clinical Research Nurse 05/01/2021 12:00 PM

## 2021-05-01 NOTE — Telephone Encounter (Signed)
PLs send a verbal order I am in [employer]

## 2021-05-01 NOTE — Telephone Encounter (Signed)
Informed patient that referral has been made to Dr. Jaynee Eagles. Instructed her to follow up with their office to make the appointment. She verbalized understanding.  Foye Spurling, BSN, RN Clinical Research Nurse 05/01/2021 12:45 PM

## 2021-05-04 ENCOUNTER — Other Ambulatory Visit: Payer: Self-pay | Admitting: Hematology and Oncology

## 2021-05-04 DIAGNOSIS — C9001 Multiple myeloma in remission: Secondary | ICD-10-CM

## 2021-05-06 ENCOUNTER — Encounter: Payer: Self-pay | Admitting: Hematology and Oncology

## 2021-05-06 NOTE — Telephone Encounter (Signed)
Pls refill electronically °

## 2021-05-16 ENCOUNTER — Encounter: Payer: Self-pay | Admitting: Hematology and Oncology

## 2021-05-16 ENCOUNTER — Encounter: Payer: Self-pay | Admitting: *Deleted

## 2021-05-16 ENCOUNTER — Other Ambulatory Visit: Payer: Self-pay | Admitting: *Deleted

## 2021-05-16 ENCOUNTER — Inpatient Hospital Stay: Payer: Medicare Other | Attending: Hematology and Oncology

## 2021-05-16 ENCOUNTER — Other Ambulatory Visit: Payer: Self-pay

## 2021-05-16 DIAGNOSIS — C9001 Multiple myeloma in remission: Secondary | ICD-10-CM | POA: Diagnosis present

## 2021-05-16 DIAGNOSIS — Z006 Encounter for examination for normal comparison and control in clinical research program: Secondary | ICD-10-CM | POA: Diagnosis present

## 2021-05-16 LAB — CBC WITH DIFFERENTIAL (CANCER CENTER ONLY)
Abs Immature Granulocytes: 0.01 10*3/uL (ref 0.00–0.07)
Basophils Absolute: 0 10*3/uL (ref 0.0–0.1)
Basophils Relative: 1 %
Eosinophils Absolute: 0.1 10*3/uL (ref 0.0–0.5)
Eosinophils Relative: 2 %
HCT: 37.2 % (ref 36.0–46.0)
Hemoglobin: 11.6 g/dL — ABNORMAL LOW (ref 12.0–15.0)
Immature Granulocytes: 0 %
Lymphocytes Relative: 57 %
Lymphs Abs: 2.4 10*3/uL (ref 0.7–4.0)
MCH: 22.8 pg — ABNORMAL LOW (ref 26.0–34.0)
MCHC: 31.2 g/dL (ref 30.0–36.0)
MCV: 73.2 fL — ABNORMAL LOW (ref 80.0–100.0)
Monocytes Absolute: 0.5 10*3/uL (ref 0.1–1.0)
Monocytes Relative: 12 %
Neutro Abs: 1.2 10*3/uL — ABNORMAL LOW (ref 1.7–7.7)
Neutrophils Relative %: 28 %
Platelet Count: 209 10*3/uL (ref 150–400)
RBC: 5.08 MIL/uL (ref 3.87–5.11)
RDW: 14.8 % (ref 11.5–15.5)
WBC Count: 4.2 10*3/uL (ref 4.0–10.5)
nRBC: 0 % (ref 0.0–0.2)

## 2021-05-16 MED ORDER — LENALIDOMIDE 5 MG PO CAPS CTSU E1A11: ORAL_CAPSULE | ORAL | 0 refills | Status: DC

## 2021-05-16 NOTE — Research (Signed)
Research - [facility] ECOG-ACRIN [id number] Maintenance Cycle 83, Day 3  Patient into clinic unaccompanied today for evaluation prior to beginning treatment Cycle 83 of Revlimid. Patient returned from her trip to [place] yesterday afternoon and was unable to come in on day 1. This RN met with patient prior to her lab appointment.    Labs: CBC was drawn per protocol.   Adverse Events Since Last Visit (in the past month): Patient reported a seizure x 1 while in [place]. See previous phone note from 04/30/21. She denies any further seizures and has appointment to see her neurologist next week. She also reports a new rash which started on 04/28/21 and resolved on 05/07/21. She describes it as very itchy and flaky with pustules. She reports it "dried up" after using Calamine lotion. Patient showed pictures of this rash she took on her phone. It was present on her forearms, lower legs and shoulders. It appeared pustular in the photos. Notified Dr. Alvy Bimler of rash and she states it is unrelated to revlimid.  Patient also reported feeling mild light headedness (grade 1) intermittently for several days. She did not pass out or fall and it resolved on it's own. Ongoing fatigue (grade 2) not limiting self-care.  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 Daily moderate back ache (grade 2) is ongoing, which she continues to take methocarbamol prn. Patient reports intermittent  insomnia (grade 2) and takes trazodone prn. Patient reports occasional migraines (grade 2) and taking tramadol prn. Patient denies diarrhea.    Patient continues to exercise regularly swimming laps in the pool 3 times weekly on average.   Maintenance therapy with Revlimid:  Patient returned completed cycle 82 Medication Calendar, confirming dosing at dose level -3, Revlimid 5 mg every other day, Days 1-21. Patient notes that no doses were missed. Patient given printed appointment calendar with Cycle 83 treatment days  marked, to document doses for the next treatment cycle, scheduled to begin today on day 3 of cycle.  Plan: This research nurse will call patient after review of today's lab results and notify if okay to start cycle 83 of Revlimid today as planned. Next lab due on 06/11/21 for lab work and AE assessment prior to starting on next cycle of Revlimid.  Patient verbalized understanding and in agreement with this plan.   Adverse Event Log Study/Protocol: [facility] ECOG E1A11 Maintenance Cycle 82: 04/16/2021-05/16/2021  (end of Cycle 00= 45/99/7741) Solicited &/or Reportable Events Grade Comments  Anemia Grade 1    Hyperglycemia Grade 0   Lymphocyte count decreased Grade 0   Neutrophil count decreased Grade 2    Platelet count decreased Grade 0    Diarrhea Grade 0   Dyspnea Grade 0   Edema: limbs Grade 0   Fatigue  Grade 2  Moderate, occasionally limiting ADLs (unchanged from previous)  Fever Grade 0   Insomnia  Grade 2   Trazodone PRN  Irritability Grade 0   Muscle weakness trunk Grade 0   Nausea Grade 1   Peripheral motor neuropathy Grade 0   Peripheral sensory neuropathy  Grade 2 Moderate symptoms, unchanged from previous  Rash acneiform Grade 2 Unrelated to revlimid per Dr. Alvy Bimler.   Vomiting Grade 0    Non-reportable AEs (unsolicited, < Grade 3): Moderate pain (grade 2) Back pain Migraine Mild pain (grade 1) Headache Bone pain        3. Seizure (grade 2)        4. Light Headedness (grade 1)  8:10 am Called patient's home and cell phone numbers and left VM on each informing patient it is okay to start Revlimid today. Informed her that her lab work is okay and that Dr.Gorsuch says her rash is not related to the Revlimid. Asked patient to return call if any questions.     Foye Spurling, BSN, RN Clinical Research Nurse 05/16/2021

## 2021-05-22 ENCOUNTER — Ambulatory Visit: Payer: Medicare Other | Admitting: Neurology

## 2021-05-22 ENCOUNTER — Other Ambulatory Visit: Payer: Self-pay

## 2021-05-22 ENCOUNTER — Encounter: Payer: Self-pay | Admitting: Neurology

## 2021-05-22 ENCOUNTER — Telehealth: Payer: Self-pay | Admitting: Neurology

## 2021-05-22 VITALS — BP 107/63 | HR 64 | Ht 67.0 in | Wt 166.0 lb

## 2021-05-22 DIAGNOSIS — R4020 Unspecified coma: Secondary | ICD-10-CM | POA: Diagnosis not present

## 2021-05-22 DIAGNOSIS — R404 Transient alteration of awareness: Secondary | ICD-10-CM

## 2021-05-22 DIAGNOSIS — G40409 Other generalized epilepsy and epileptic syndromes, not intractable, without status epilepticus: Secondary | ICD-10-CM

## 2021-05-22 DIAGNOSIS — G43709 Chronic migraine without aura, not intractable, without status migrainosus: Secondary | ICD-10-CM

## 2021-05-22 MED ORDER — EMGALITY 120 MG/ML ~~LOC~~ SOAJ: 120.0000 mg | SUBCUTANEOUS | 11 refills | Status: DC

## 2021-05-22 NOTE — Patient Instructions (Signed)
1 shot emgality on the 15th of every month Set up a call or video in 3-4 months MRI of the brain and an eeg  Galcanezumab injection What is this medication? GALCANEZUMAB (gal ka NEZ ue mab) is used to prevent migraines and treat clusterheadaches. This medicine may be used for other purposes; ask your health care provider orpharmacist if you have questions. COMMON BRAND NAME(S): Emgality What should I tell my care team before I take this medication? They need to know if you have any of these conditions: an unusual or allergic reaction to galcanezumab, other medicines, foods, dyes, or preservatives pregnant or trying to get pregnant breast-feeding How should I use this medication? This medicine is for injection under the skin. You will be taught how to prepare and give this medicine. Use exactly as directed. Take your medicine atregular intervals. Do not take your medicine more often than directed. It is important that you put your used needles and syringes in a special sharps container. Do not put them in a trash can. If you do not have a sharpscontainer, call your pharmacist or healthcare provider to get one. Talk to your pediatrician regarding the use of this medicine in children.Special care may be needed. Overdosage: If you think you have taken too much of this medicine contact apoison control center or emergency room at once. NOTE: This medicine is only for you. Do not share this medicine with others. What if I miss a dose? If you miss a dose, take it as soon as you can. If it is almost time for yournext dose, take only that dose. Do not take double or extra doses. What may interact with this medication? Interactions are not expected. This list may not describe all possible interactions. Give your health care provider a list of all the medicines, herbs, non-prescription drugs, or dietary supplements you use. Also tell them if you smoke, drink alcohol, or use illegaldrugs. Some items may  interact with your medicine. What should I watch for while using this medication? Tell your doctor or healthcare professional if your symptoms do not start toget better or if they get worse. What side effects may I notice from receiving this medication? Side effects that you should report to your doctor or health care professionalas soon as possible: allergic reactions like skin rash, itching or hives, swelling of the face, lips, or tongue Side effects that usually do not require medical attention (report these toyour doctor or health care professional if they continue or are bothersome): pain, redness, or irritation at site where injected This list may not describe all possible side effects. Call your doctor for medical advice about side effects. You may report side effects to FDA at1-800-FDA-1088. Where should I keep my medication? Keep out of the reach of children. You will be instructed on how to store this medicine. Throw away any unusedmedicine after the expiration date on the label. NOTE: This sheet is a summary. It may not cover all possible information. If you have questions about this medicine, talk to your doctor, pharmacist, orhealth care provider.  2022 Elsevier/Gold Standard (2018-05-05 12:03:23)

## 2021-05-22 NOTE — Telephone Encounter (Signed)
MRI brain w/wo contrast U[employer] auth: NPR ref # [id number]  Sent to GI for scheduling

## 2021-05-22 NOTE — Progress Notes (Signed)
[facility]   Provider:  Dr Jaynee Eagles Referring Provider: Wenda Low, MD Primary Care Physician:  Wenda Low, MD       CC:  Chronic migraines and episodes of body paresthesias  She hadn't smoked marijuana in 30 years and went to [place] and tried smoking, she took 5 puffs, she lost consciousness, she felt terrible, she has seizure-like activity, she had CPR, she couldn't move to drink water, she doesn't remember it, she had her arms flexed, no urination or tongue biting, was 25 minutes, she stopped breathing, they gave her mouth to mouth and cpr.   Patient complains of symptoms per HPI as well as the following symptoms: seizure . Pertinent negatives and positives per HPI. All others negative   Interval history 06/25/2017: She has constant pressure on the left side of the head, she has 6/10 daily headache, migraines are on the left side, can last days with light and sound sensitivity, nausea, no vomiting, pounding pain severe, no aura, no medication overuse. She takes 2 tramadol at the onset of migraine. Dr. Erling Cruz had put her on Verapamil and Effexor. She does feel that the Verapamil helps, if she doesn't take it she get a migraine.    Interval history 06/25/2016: She gets 3-4 migraines a month, they last an hour and go away with the tramadol. She still have low back pain and radiation into the legs. She has numbness and weakness in the legs intermittently but MRI of the neural axis was negative. Kneeling thing we didn't do was imaging MRI of the lumbar spine. Considering that she continues to have numbness and weakness in the legs will image MRI of the lumbar spine.   Interval update 03/18/2016: This is a lovely 65 year old female with a very complicated past medical history including hypertension, thyroid disorder, migraine, multiple myeloma s/p chemo currently participating in a research trial, neuropathy, insomnia, bone pain, back pain, joint pain, anemia, fatigue,  leukopenia due to antineoplastic chemotherapy, right leg swelling, DVT, diabetes. I have seen patient in the past for chronic migraines but today she is here for a new problem, paresthesias that started February. Happens when waking up in the morning. Never happens otherwise. She feels an electrical current through her body with persistent tingling.  Starts in the thoracic or in the lumbar spine. Feels like electricity. Starts in the spine. She feels tingling all over, in the face, arms, legs from head to toe.  Will last several hours. A lot of times it is associated with a migraine. Nothing makes it better. Stretching aggravates the symptoms. She has pain in the spine associated with the paresthesias. Last night it started with numbness at 2am. She couldn't get back to sleep. She developed a headache. He still has residual headache and Her left eye still fells sore and head feels sore. Also a residual burning feeling. Arms feel like they are burning. Episodes becoming more frequent. 6 episodes in Feb, 8 in march, 13 so far in April. Becoming more frequent and lasting longer. She experiences facial numbness with the episodes, burning and tingling in the spine that spreads to her whole body. The symptoms are usually associated with headache or migraine. Moving around helps with the symptoms. No weakness. No vision changes but does describe eye pain. The headaches are pressure and pain.she still gets the vertigo with the migraines. Headache left side of the head with the vertigo, no light or sound sensitivity. 5-10 minutes after the paresthesias she has the migraines/headaches. No inciting events. No  trauma. She has low back pain, situated on the right side, radiates down the legs to the back of the legs, walking is not as good. Right leg goes numb. Paresthesias are more on the left.    Recent labs include normal B12. Normal TSH.   Interval update 06/19/2015: This is a former patient of Dr. Janann Colonel who is seen in  our office by our nurse practitioner [name]. Patient is establishing care with me today. She is a 65 year old female with a history of chronic migraines. She was last seen in April. Today she returns and she is doing very well. She is on verapamil 120 mg extended release and venlafaxine XR 75 mg. She is doing very well. She has had maybe 3 migraines within the last few months. And they have been on the left side of the head with vertigo twice. When she has the vertigo, she has to lay in bed and sleep it off. But this is a significant improvement and she is very happy with this maintenance. She endorses nausea, photophobia and phonophobia. She takes tramadol for acute management. Discussed in detail that if she is doing well, we will keep her on her current migraine management. She can continue to follow up with [name] as needed.     HISTORY OF PRESENT ILLNESS: Ms. Tine is a 65 year old female with a history of chronic migraines. She returns today for follow-up. The patient is currently taking Effexor 37.5 mg daily as well as verapamil 120 mg daily. She reports that at the last visit Dr. Janann Colonel decreased her Effexor. She states since then she has had 13 headaches in March and she has had 4 headaches in April so far. She states her headaches are normally located on the left side and associated with left-sided numbness. She confirms nausea, photophobia and phonophobia. She also states that when the headaches become severe its as if she is on the verge of having vertigo. She states that she can take tramadol and her headache will normally resolve in 1-2 hours. She states that if she gets a headache on the right side it usually resolves with Advil migraine. The patient does have multiple myeloma and takes a chemotherapy pill. She denies any new neurological symptoms. Denies any new medical issues. She returns today for evaluation.   HISTORY 08/02/14 Janann Colonel): Rainbow JANEY PETRON is a 65 y.o. female,  former Dr Erling Cruz patient, here as a follow up appointment for chronic migraines with last visit being on 08/09/2013 at which time she received Botox injections. She notes good benefit from these injections. Did not follow up for repeat injections. Since last visit she was unfortunately diagnosed with multiple myeloma and has been undergoing treatment with oncology. Reports so far things are going well. She has one more cycle and then she is finished. She reports she has held off on the Botox injections per recommendation of her oncologist, instructed she can restart once she is done with chemotherapy.    Continues to have headaches. Typically will have 1 to 3 headaches per month. Takes tramadol and falls asleep, when she wakes up the headache is gone. Describes it is a predominantly right sided temporal squeezing pounding type pain. + Nausea, no emesis, + photo and phonophobia. Headaches last 6 hours to all day. Notes she gets weak on the left side with the headaches and has baseline numbness on the left side of her body. Reports she has not had a MRI brain in years. She notes Dr Erling Cruz  started her on Effexor and Verapamil. She wishes to get off both of these medications as she feels they are not helping.    Recently saw eye doctor for right eye pain, told she may have glaucoma in her right eye, they will continue to monitor. They also told her that she has dry eyes, was given lubricating eye drops. The eye drops have improved the eye pain.    Initial visit 05/2013: Former Dr Erling Cruz patient. She reports being overall stable. Continues to have daily migrainous headache which have been ongoing for greater then 4 years. Predominantly R sided temporal, described as squeezing and pounding. These headaches are her baseline headache and are occuring on a near continuous pattern every day of the month. Is also having severe "flare ups" where the pain worsens and becomes a more sharp/intense pain. These episodes are occuring  around 8 to 10 times per month. With these headaches she continues to have episodes of transient unilateral (predominantly R sided) weakness that self resolves. Also notes occasional unilateral sensory changes (again R sided predominant). Also has episodes of transient vertigo associated with the headache, has occurred 2 times in the past month, requiring her to miss work. She has had multiple MRIs during these events to rule out stroke and no ischemic changes have been found. Has been unable to tolerate most abortive agents and due to presence of hemiplegic/basilar symptoms she is not a candidate for triptans or DHE. In regards to prophylactic agents she has tried for >3-4 months the following agents and failed/did not tolerate: Topamax (AED), Propranolol (BB), verapamil (CCB) and Effexor (SNRI). Remains on verapmil and Effexor per Dr Erling Cruz but no benefit noted. Denies any family history of hemiplegic migraine.    Continues to have subjective memory difficulties, notes a "fogginess". Has had formal neuro-psych testing which showed an improvement in her overall cognitive function. Continues to have episodes of auditory hallucinations. Has not seen a psychiatrist for this.       Social History   Socioeconomic History   Marital status: Married    Spouse name: Arnell Sieving   Number of children: 2   Years of education: 14   Highest education level: Not on file  Occupational History    Employer: [employer]  Tobacco Use   Smoking status: Never   Smokeless tobacco: Never  Vaping Use   Vaping Use: Never used  Substance and Sexual Activity   Alcohol use: No    Alcohol/week: 0.0 standard drinks   Drug use: No   Sexual activity: Not on file  Other Topics Concern   Not on file  Social History Narrative   Patient lives at home with her husband Special educational needs teacher). Patient has two years college.   Right handed.   Caffeine- None   Social Determinants of Health   Financial Resource Strain: Not on file  Food  Insecurity: Not on file  Transportation Needs: Not on file  Physical Activity: Not on file  Stress: Not on file  Social Connections: Not on file  Intimate Partner Violence: Not on file    Family History  Problem Relation Age of Onset   Throat cancer Father    Stroke Father    Cancer Brother        prostate   Breast cancer Neg Hx     Past Medical History:  Diagnosis Date   Abnormal thyroid function test 08/30/2014   Anemia    Anemia, unspecified 10/06/2013   Bone pain 10/21/2013   Bronchitis 01/31/2015  Diabetes mellitus without complication ([place]) [id number]   Steroid induced diabetes. has not picked oral med up from pharmacy as of 09-14-14   Diverticulitis 07/18/2014   DVT (deep venous thrombosis) ([place]) 02/07/2014   HBP (high blood pressure)    Insomnia 02/11/2016   Leukopenia due to antineoplastic chemotherapy ([place]) 12/27/2013   Memory loss    MGUS (monoclonal gammopathy of unknown significance)    MGUS (monoclonal gammopathy of unknown significance) 10/06/2013   Migraine    Multiple myeloma, without mention of having achieved remission 11/16/2013   Pancytopenia ([place]) 12/27/2015   Peripheral neuropathy 12/27/2015   Personal history of chemotherapy    Right leg swelling 02/07/2014   Seizure ([place]) 1960   single seizure episode at age 50   Thyroid disorder    Vitamin D deficiency 10/24/2014    Past Surgical History:  Procedure Laterality Date   HEMORRHOID SURGERY     PORT-A-CATH REMOVAL  10-2014   PORTACATH PLACEMENT Right jan 2015    Current Outpatient Medications  Medication Sig Dispense Refill   aspirin 325 MG tablet Take 325 mg by mouth daily.      Cholecalciferol (VITAMIN D3) 2000 UNITS TABS Take 2,000 Units by mouth daily.     dorzolamide-timolol (COSOPT) 22.3-6.8 MG/ML ophthalmic solution Place 1 drop into both eyes 2 (two) times daily.     Galcanezumab-gnlm (EMGALITY) 120 MG/ML SOAJ Inject 120 mg into the skin every 30 (thirty) days. 6 mL 11   hydrochlorothiazide  (HYDRODIURIL) 25 MG tablet Take 25 mg by mouth every evening.      lenalidomide (REVLIMID) 5 MG capsule Take 1 capsule daily every other day on days 1-21. Repeat every 28 days. 21 capsule 0   levothyroxine (SYNTHROID, LEVOTHROID) 75 MCG tablet Take 75 mcg by mouth daily before breakfast.     methocarbamol (ROBAXIN) 500 MG tablet Take 1 tablet (500 mg total) by mouth every 8 (eight) hours as needed for muscle spasms. 90 tablet 1   Multiple Vitamins-Minerals (CENTRUM SILVER PO) Take 1 tablet by mouth daily.      polyethylene glycol (MIRALAX / GLYCOLAX) packet Take 17 g by mouth daily as needed for mild constipation.      RESTASIS 0.05 % ophthalmic emulsion PLACE 1 DROP IN BOTH EYES IN THE EVENING  4   rosuvastatin (CRESTOR) 5 MG tablet Take 5 mg by mouth daily at 2 PM.     sitaGLIPtin (JANUVIA) 50 MG tablet Take 50 mg by mouth daily.     traZODone (DESYREL) 50 MG tablet TAKE 1 TABLET (50 MG TOTAL) BY MOUTH AT BEDTIME. 90 tablet 3   venlafaxine XR (EFFEXOR-XR) 150 MG 24 hr capsule Take 1 capsule (150 mg total) by mouth daily with breakfast. 90 capsule 3   verapamil (VERELAN PM) 120 MG 24 hr capsule Take 1 capsule (120 mg total) by mouth daily. 90 capsule 3   No current facility-administered medications for this visit.   Facility-Administered Medications Ordered in Other Visits  Medication Dose Route Frequency Provider Last Rate Last Admin   gadopentetate dimeglumine (MAGNEVIST) injection 15 mL  15 mL Intravenous Once PRN Melvenia Beam, MD        Allergies as of 05/22/2021 - Review Complete 05/22/2021  Allergen Reaction Noted   Metformin Diarrhea 04/22/2021   Exam: NAD, pleasant                  Speech:    Speech is normal; fluent and spontaneous with normal comprehension.  Cognition:  The patient is oriented to person, place, and time;     recent and remote memory intact;     language fluent;    Cranial Nerves:    The pupils are equal, round, and reactive to light.Trigeminal  sensation is intact and the muscles of mastication are normal. The face is symmetric. The palate elevates in the midline. Hearing intact. Voice is normal. Shoulder shrug is normal. The tongue has normal motion without fasciculations.   Coordination:  No dysmetria  Motor Observation:    No asymmetry, no atrophy, and no involuntary movements noted. Tone:    Normal muscle tone.     Strength:    Strength is V/V in the upper and lower limbs.      Sensation: intact to LT      Assessment/Plan:  This is a lovely 65 year old female with a very complicated past medical history including hypertension, thyroid disorder, migraine, multiple myeloma s/p chemo currently participating in a research trial, neuropathy, insomnia, bone pain, back pain, joint pain, anemia, fatigue, leukopenia due to antineoplastic chemotherapy, right leg swelling, DVT, diabetes. MRI imaging of the brain, cervical spinal and thoracic spine were negative in the past. We have seen her for numbness from the abdomen down the legs,weakness in the legs, ow back pain and radiation, migraines. She is her for a provoked seizure..   Will try Emgality for migraines (she could not get aimovig approved in the past but she has failed multiple preventatives she will return in 3-4 months to see me, gave her samples this time) She had a provoked seizure due to [facility] overdose but will get an MRI and EEG. Since provoked, do not believe she has to stop driving. She does partake in [facility] or other dgs, this was a one time visit to [place] and will never do it again.   Meds ordered this encounter  Medications   Galcanezumab-gnlm (EMGALITY) 120 MG/ML SOAJ    Sig: Inject 120 mg into the skin every 30 (thirty) days.    Dispense:  6 mL    Refill:  11   Orders Placed This Encounter  Procedures   MR BRAIN W WO CONTRAST   EEG adult      Sarina Ill, MD  [facility] [address] [place], [place]  [address]  Phone [phone] Fax [phone]  I spent 40 minutes of face-to-face and non-face-to-face time with patient on the  1. Tonic clonic seizures ([place])   2. Loss of consciousness ([place])   3. Altered awareness, transient   4. Chronic migraine without aura without status migrainosus, not intractable    diagnosis.  This included previsit chart review, lab review, study review, order entry, electronic health record documentation, patient education on the different diagnostic and therapeutic options, counseling and coordination of care, risks and benefits of management, compliance, or risk factor reduction

## 2021-05-23 DIAGNOSIS — D508 Other iron deficiency anemias: Secondary | ICD-10-CM | POA: Diagnosis not present

## 2021-05-23 DIAGNOSIS — E1169 Type 2 diabetes mellitus with other specified complication: Secondary | ICD-10-CM | POA: Diagnosis not present

## 2021-05-23 DIAGNOSIS — E119 Type 2 diabetes mellitus without complications: Secondary | ICD-10-CM | POA: Diagnosis not present

## 2021-05-23 DIAGNOSIS — I10 Essential (primary) hypertension: Secondary | ICD-10-CM | POA: Diagnosis not present

## 2021-05-23 DIAGNOSIS — E039 Hypothyroidism, unspecified: Secondary | ICD-10-CM | POA: Diagnosis not present

## 2021-05-23 DIAGNOSIS — K219 Gastro-esophageal reflux disease without esophagitis: Secondary | ICD-10-CM | POA: Diagnosis not present

## 2021-05-23 DIAGNOSIS — I200 Unstable angina: Secondary | ICD-10-CM | POA: Diagnosis not present

## 2021-05-31 ENCOUNTER — Ambulatory Visit
Admission: RE | Admit: 2021-05-31 | Discharge: 2021-05-31 | Disposition: A | Discharge status: Home / Self Care | Payer: Medicare Other | Source: Ambulatory Visit | Attending: Neurology | Admitting: Neurology

## 2021-05-31 ENCOUNTER — Other Ambulatory Visit: Payer: Self-pay | Admitting: Hematology and Oncology

## 2021-05-31 DIAGNOSIS — G40409 Other generalized epilepsy and epileptic syndromes, not intractable, without status epilepticus: Secondary | ICD-10-CM | POA: Diagnosis not present

## 2021-05-31 DIAGNOSIS — C9001 Multiple myeloma in remission: Secondary | ICD-10-CM

## 2021-05-31 DIAGNOSIS — R404 Transient alteration of awareness: Secondary | ICD-10-CM

## 2021-05-31 DIAGNOSIS — R4020 Unspecified coma: Secondary | ICD-10-CM

## 2021-05-31 MED ORDER — GADOBENATE DIMEGLUMINE 529 MG/ML IV SOLN
15.0000 mL | Freq: Once | INTRAVENOUS | Status: AC | PRN
  Administered 2021-05-31: 15 mL via INTRAVENOUS

## 2021-05-31 NOTE — Telephone Encounter (Signed)
Pls refill electronically °

## 2021-06-06 ENCOUNTER — Telehealth: Payer: Self-pay

## 2021-06-06 ENCOUNTER — Other Ambulatory Visit: Payer: Self-pay | Admitting: Hematology and Oncology

## 2021-06-06 NOTE — Telephone Encounter (Signed)
Pt verified by name and DOB,  MRI results given per provider, pt voiced understanding all question answered.   PT has EEG appt scheduled next week And VV with Dr. Jaynee Eagles in September

## 2021-06-06 NOTE — Telephone Encounter (Signed)
-----   Message from Melvenia Beam, MD sent at 06/05/2021  5:24 PM EDT ----- Patient has very tiny small stroke in a part of the brain. I don't see it in the MRI from 2017. Looks like a little blood vessel that clogged up. Probably asymptomatic. Again, very, very small but we should still discuss it and see if we need anymore follow up. Make sure she is taking her aspirin and watching her cholesterol and blood pressure and schedule her for a follow up with me please. thanks

## 2021-06-11 ENCOUNTER — Ambulatory Visit: Payer: Medicare Other | Admitting: Neurology

## 2021-06-11 ENCOUNTER — Inpatient Hospital Stay: Payer: Medicare Other | Attending: Hematology and Oncology

## 2021-06-11 ENCOUNTER — Encounter: Payer: Self-pay | Admitting: *Deleted

## 2021-06-11 ENCOUNTER — Other Ambulatory Visit: Payer: Self-pay

## 2021-06-11 DIAGNOSIS — G40409 Other generalized epilepsy and epileptic syndromes, not intractable, without status epilepticus: Secondary | ICD-10-CM

## 2021-06-11 DIAGNOSIS — C9001 Multiple myeloma in remission: Secondary | ICD-10-CM

## 2021-06-11 DIAGNOSIS — Z006 Encounter for examination for normal comparison and control in clinical research program: Secondary | ICD-10-CM | POA: Diagnosis not present

## 2021-06-11 DIAGNOSIS — R4020 Unspecified coma: Secondary | ICD-10-CM

## 2021-06-11 DIAGNOSIS — R404 Transient alteration of awareness: Secondary | ICD-10-CM

## 2021-06-11 LAB — CBC WITH DIFFERENTIAL (CANCER CENTER ONLY)
Abs Immature Granulocytes: 0.01 10*3/uL (ref 0.00–0.07)
Basophils Absolute: 0 10*3/uL (ref 0.0–0.1)
Basophils Relative: 0 %
Eosinophils Absolute: 0 10*3/uL (ref 0.0–0.5)
Eosinophils Relative: 1 %
HCT: 36.1 % (ref 36.0–46.0)
Hemoglobin: 11.3 g/dL — ABNORMAL LOW (ref 12.0–15.0)
Immature Granulocytes: 0 %
Lymphocytes Relative: 59 %
Lymphs Abs: 2 10*3/uL (ref 0.7–4.0)
MCH: 23 pg — ABNORMAL LOW (ref 26.0–34.0)
MCHC: 31.3 g/dL (ref 30.0–36.0)
MCV: 73.5 fL — ABNORMAL LOW (ref 80.0–100.0)
Monocytes Absolute: 0.5 10*3/uL (ref 0.1–1.0)
Monocytes Relative: 13 %
Neutro Abs: 0.9 10*3/uL — ABNORMAL LOW (ref 1.7–7.7)
Neutrophils Relative %: 27 %
Platelet Count: 168 10*3/uL (ref 150–400)
RBC: 4.91 MIL/uL (ref 3.87–5.11)
RDW: 14.9 % (ref 11.5–15.5)
WBC Count: 3.4 10*3/uL — ABNORMAL LOW (ref 4.0–10.5)
nRBC: 0 % (ref 0.0–0.2)

## 2021-06-11 IMAGING — MG MM DIGITAL SCREENING BILAT W/ TOMO AND CAD
8 series · 8 of 24 positions shown · non-contrast
Comparison: Previous exam(s).

CLINICAL DATA: Screening.

EXAM:
DIGITAL SCREENING BILATERAL MAMMOGRAM WITH TOMOSYNTHESIS AND CAD
TECHNIQUE: Bilateral screening digital craniocaudal and mediolateral oblique
mammograms were obtained. Bilateral screening digital breast
tomosynthesis was performed. The images were evaluated with
computer-aided detection.

[R MLO synth-2D]
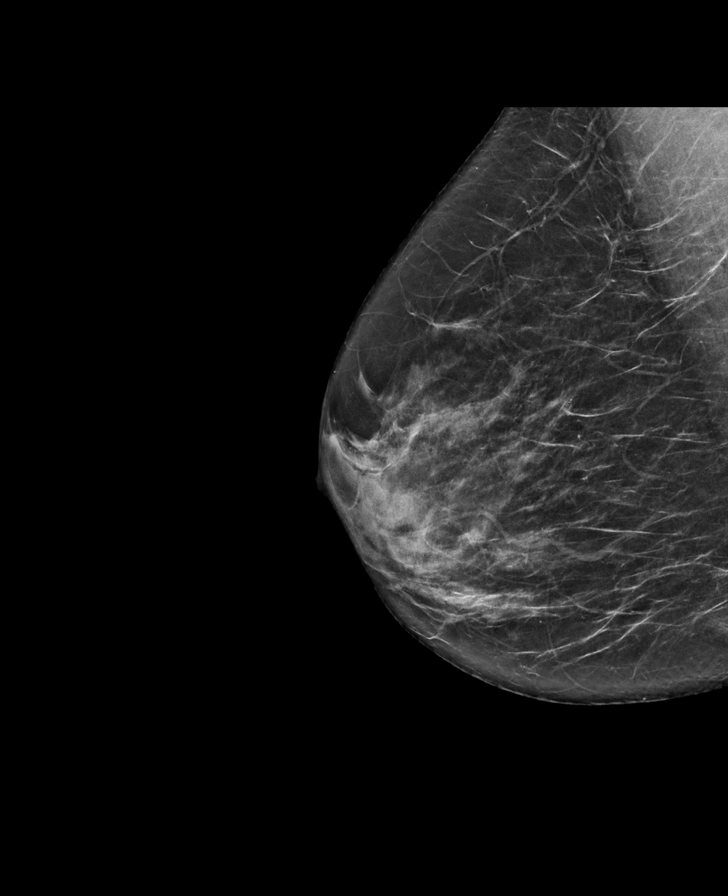

[L MLO synth-2D]
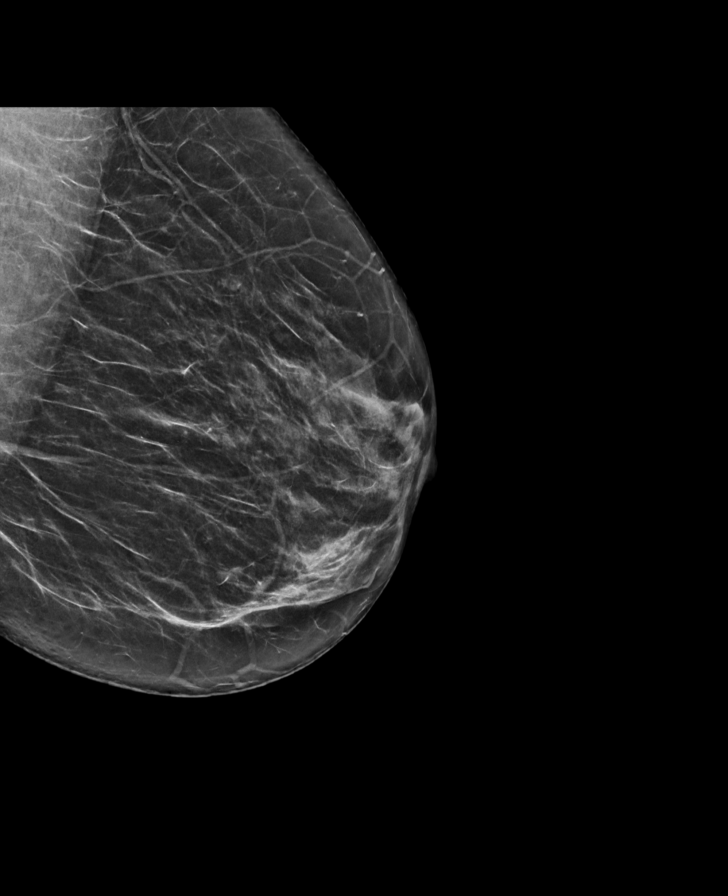

[R CC synth-2D]
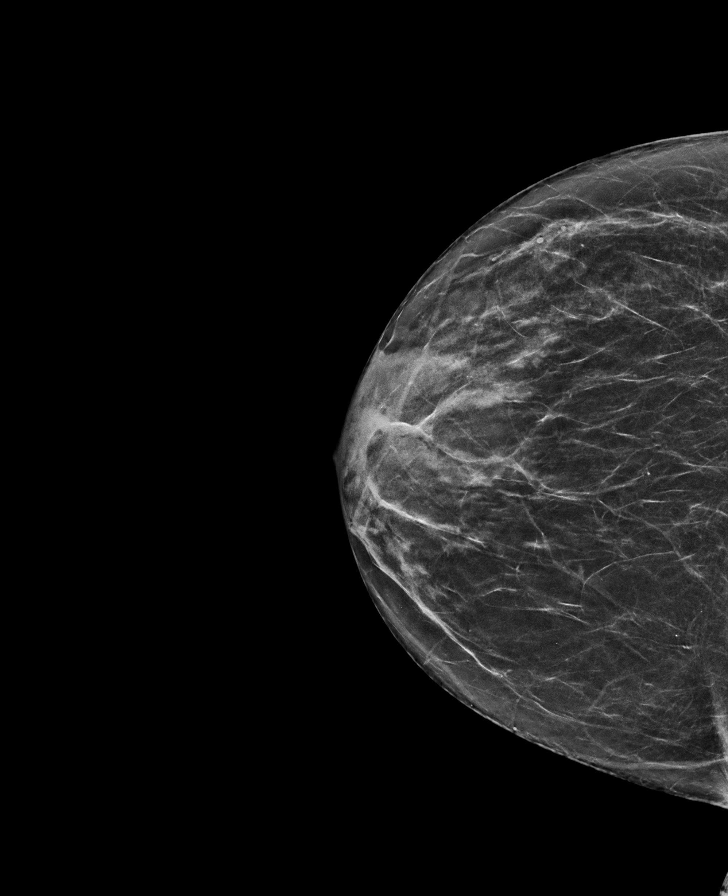

[L CC synth-2D]
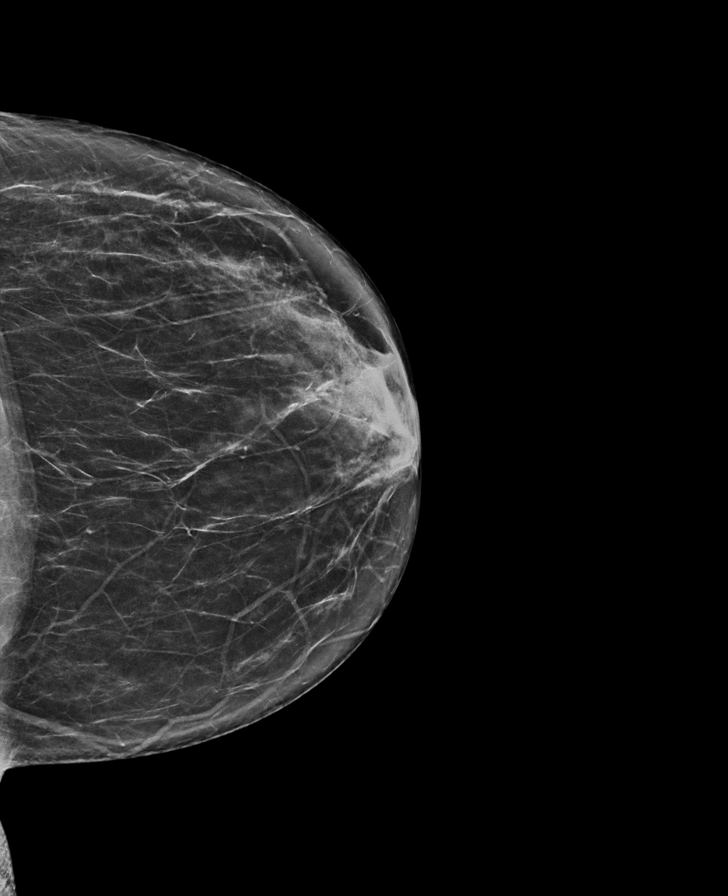

[R MLO tomo · tomo slice 33/66.0]
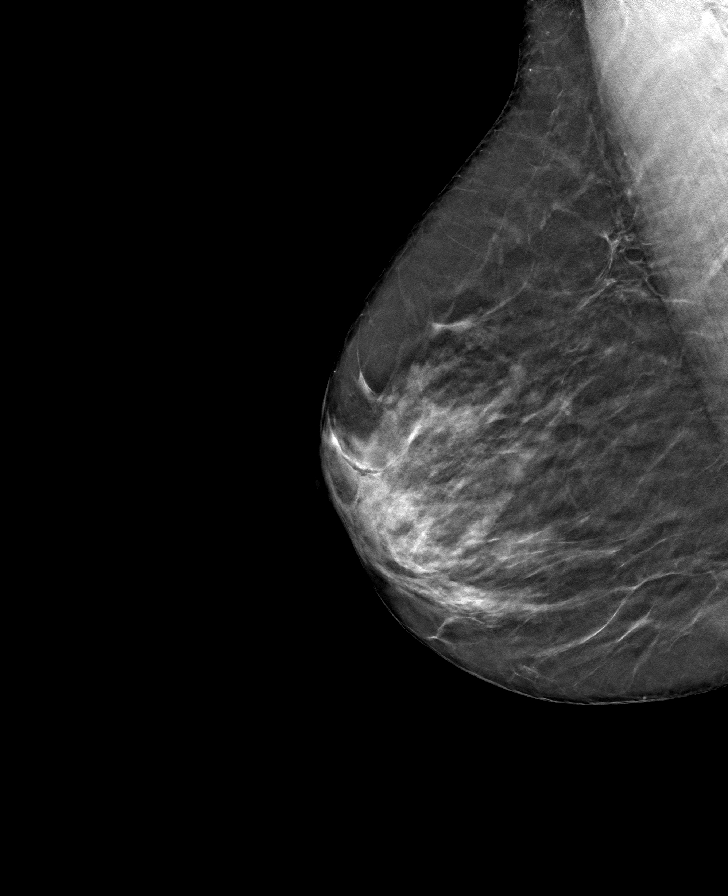

[L MLO tomo · tomo slice 33/65.0]
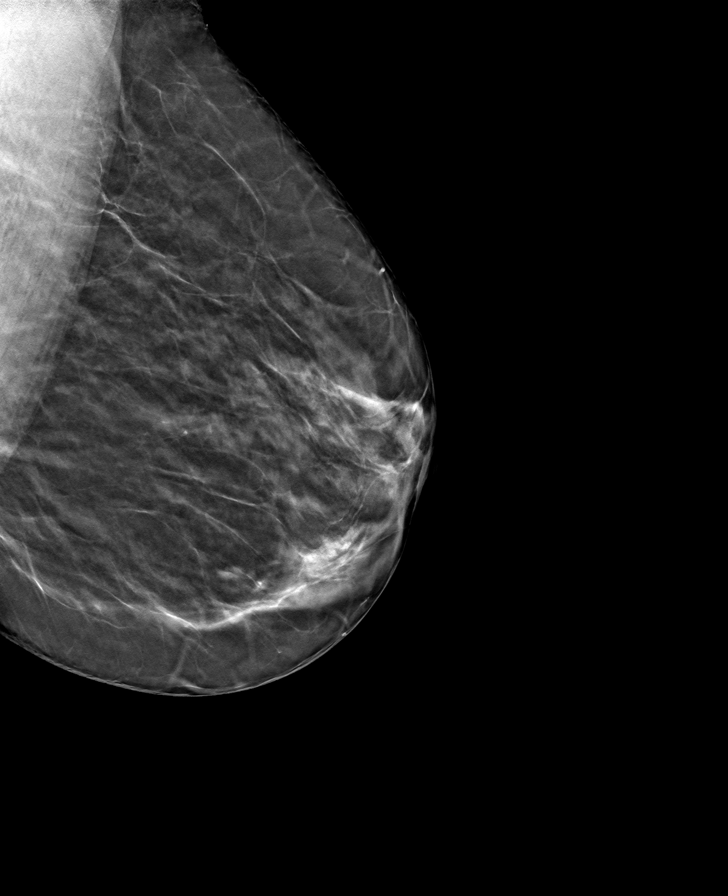

[R CC tomo · tomo slice 31/62.0]
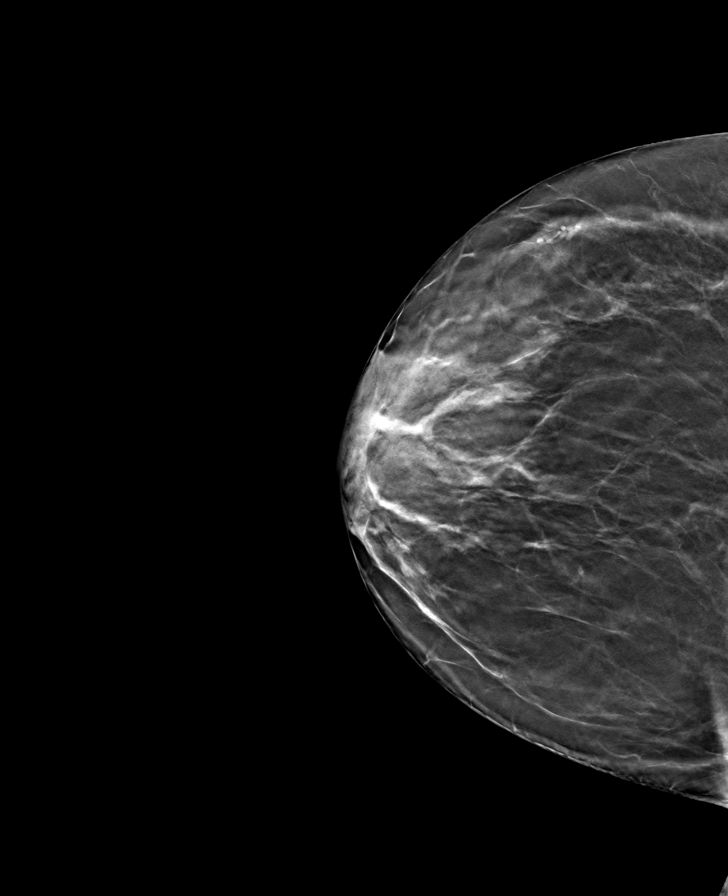

[L CC tomo · tomo slice 33/64.0]
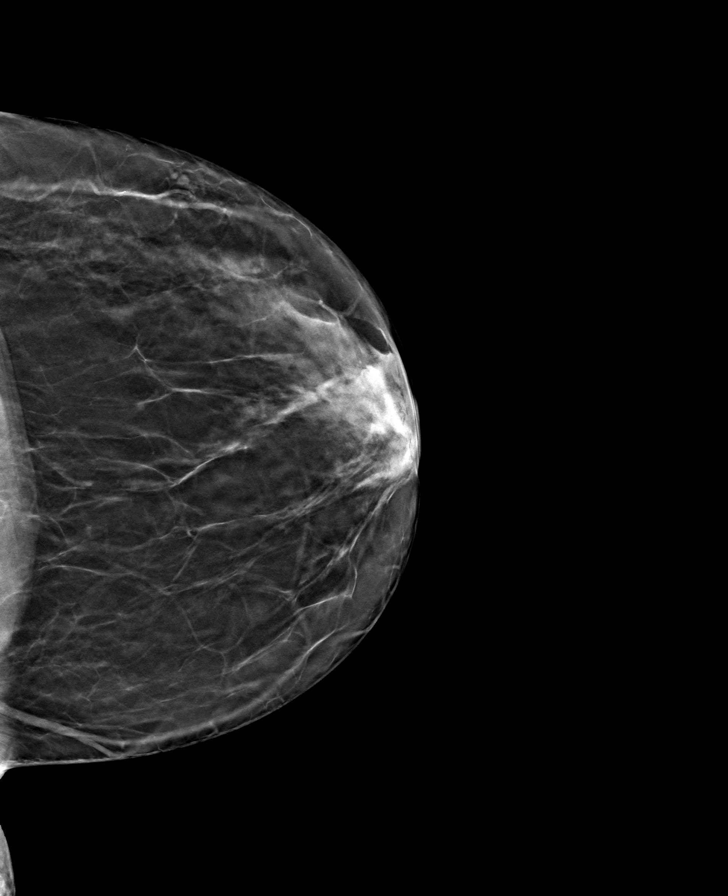

[8 of 24 positions shown; findings below may reference images not displayed]

ACR Breast Density Category c: The breast tissue is heterogeneously
dense, which may obscure small masses.
FINDINGS: There are no findings suspicious for malignancy.
IMPRESSION: No mammographic evidence of malignancy. A result letter of this
screening mammogram will be mailed directly to the patient.

RECOMMENDATION:
Screening mammogram in one year. (Code:Q3-W-BC3)

BI-RADS CATEGORY  1: Negative.

## 2021-06-11 NOTE — Research (Signed)
Research - [facility] ECOG-ACRIN [id number] Maintenance Cycle 84, Day 1  Patient into clinic unaccompanied today for evaluation prior to beginning treatment Cycle 84 of Revlimid. This RN met with patient after her lab appointment.    Labs: CBC was drawn per protocol.   Adverse Events Since Last Visit (in the past month): Patient denies any further seizures and has been worked up by Garment/textile technologist. EEG is scheduled for later today. MRI of brain on 05/31/21 showed a small stroke (grade 1) age indeterminate, but last MRI from 2017 did not show this so this apparently occurred sometime in the past 5 years. Per Dr. Alvy Bimler stroke is probably related to revlimid. Patient denies having any stroke symptoms in the past 5 years. Patient confirmed she continues to take aspirin daily as prescribed and she is aware that stroke can be due to taking revlimid. Patient denies any further rash since last visit.  She does not report any further light headedness since last study visit.  Ongoing fatigue (grade 2) not limiting self-care.  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 Daily moderate back ache (grade 2) is ongoing, which she continues to take methocarbamol and tramadol prn. Patient reports more frequent insomnia (grade 2) and continues to take trazodone prn. Patient reports occasional migraines (grade 2) and taking tramadol prn. She also recently started on emgality for migraines.    Patient continues to exercise regularly swimming laps in the pool 3 times weekly on average.   Maintenance therapy with Revlimid:  Patient returned completed cycle 83 Medication Calendar, confirming dosing at dose level -3, Revlimid 5 mg every other day, Days 1-21. Patient notes that no doses were missed.   Plan:  Grade 3 Neutropenic noted while patient still in clinic. Informed Dr. Alvy Bimler of [place] 0.9 and she agrees it is appropriate to hold revlimid and recheck CBC in one week per protocol. Notified patient of [place]  result and instructed to hold revlimid and return to clinic in one week for another CBC.  Lab appointment scheduled with patient for 7/19 at 7:30 am.   Patient verbalized understanding and in agreement with this plan   Adverse Event Log Study/Protocol: [facility] ECOG E1A11 Maintenance Cycle 83: 05/16/2021 -06/11/21 (end of Cycle 67= 20/94/7096) Solicited &/or Reportable Events Grade Comments  Anemia Grade 1    Hyperglycemia Grade 0   Lymphocyte count decreased Grade 0   Neutrophil count decreased Grade 3  Revlimid Held, Recheck in one week  Platelet count decreased Grade 0    Diarrhea Grade 0   Dyspnea Grade 0   Edema: limbs Grade 0   Fatigue  Grade 2  Moderate, occasionally limiting ADLs (unchanged from previous)  Fever Grade 0   Insomnia  Grade 2   Trazodone PRN  Irritability Grade 0   Muscle weakness trunk Grade 0   Nausea Grade 0   Peripheral motor neuropathy Grade 0   Peripheral sensory neuropathy  Grade 2 Moderate symptoms, unchanged from previous  Rash acneiform Grade 0    Vomiting Grade 0    Non-reportable AEs (unsolicited, < Grade 3):  Stroke (grade 1)  Moderate pain (grade 2) Back pain Migraine Mild pain (grade 1) Headache         Foye Spurling, BSN, RN Clinical Research Nurse 06/11/2021

## 2021-06-18 ENCOUNTER — Other Ambulatory Visit: Payer: Self-pay

## 2021-06-18 ENCOUNTER — Inpatient Hospital Stay: Payer: Medicare Other

## 2021-06-18 ENCOUNTER — Encounter: Payer: Self-pay | Admitting: *Deleted

## 2021-06-18 DIAGNOSIS — C9001 Multiple myeloma in remission: Secondary | ICD-10-CM

## 2021-06-18 DIAGNOSIS — Z006 Encounter for examination for normal comparison and control in clinical research program: Secondary | ICD-10-CM | POA: Diagnosis not present

## 2021-06-18 LAB — CBC WITH DIFFERENTIAL (CANCER CENTER ONLY)
Abs Immature Granulocytes: 0.01 10*3/uL (ref 0.00–0.07)
Basophils Absolute: 0 10*3/uL (ref 0.0–0.1)
Basophils Relative: 1 %
Eosinophils Absolute: 0.1 10*3/uL (ref 0.0–0.5)
Eosinophils Relative: 2 %
HCT: 36.3 % (ref 36.0–46.0)
Hemoglobin: 11.4 g/dL — ABNORMAL LOW (ref 12.0–15.0)
Immature Granulocytes: 0 %
Lymphocytes Relative: 53 %
Lymphs Abs: 1.6 10*3/uL (ref 0.7–4.0)
MCH: 23.1 pg — ABNORMAL LOW (ref 26.0–34.0)
MCHC: 31.4 g/dL (ref 30.0–36.0)
MCV: 73.5 fL — ABNORMAL LOW (ref 80.0–100.0)
Monocytes Absolute: 0.4 10*3/uL (ref 0.1–1.0)
Monocytes Relative: 14 %
Neutro Abs: 0.9 10*3/uL — ABNORMAL LOW (ref 1.7–7.7)
Neutrophils Relative %: 30 %
Platelet Count: 218 10*3/uL (ref 150–400)
RBC: 4.94 MIL/uL (ref 3.87–5.11)
RDW: 15 % (ref 11.5–15.5)
WBC Count: 3 10*3/uL — ABNORMAL LOW (ref 4.0–10.5)
nRBC: 0 % (ref 0.0–0.2)

## 2021-06-18 NOTE — Research (Signed)
Research - [facility] ECOG-ACRIN [id number] Maintenance Cycle 84, Day 1- Delayed by One Week  Patient into clinic unaccompanied today for evaluation prior to beginning treatment Cycle 84 of Revlimid. This cycle has been delayed by one week due to low ANC count last week.  This RN met with patient after her lab appointment.    Labs: CBC was drawn per protocol.   Adverse Events Since Last Visit (in the past month): Patient denies any further seizures and has been worked up by Garment/textile technologist. EEG was completed last week but we do not have results available yet. MRI of brain on 05/31/21 showed a small stroke (grade 1) age indeterminate, but last MRI from 2017 did not show this so this apparently occurred sometime in the past 5 years. Per Dr. Alvy Bimler stroke is probably related to revlimid. Patient denies having any stroke symptoms in the past 5 years. Patient confirmed she continues to take aspirin daily as prescribed and she is aware that stroke can be due to taking revlimid. Patient denies any further rash since last visit.  She does not report any further light headedness since last study visit.  Ongoing fatigue (grade 2) not limiting self-care.  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 Daily moderate back ache (grade 2) is ongoing, which she continues to take methocarbamol and tramadol prn. Patient reports ongoing insomnia (grade 2) and continues to take trazodone prn. Patient reports occasional migraines (grade 2) and taking tramadol prn. She also recently started on emgality for migraines.    Patient continues to exercise regularly swimming laps in the pool 3 times weekly on average.   Maintenance therapy with Revlimid:  Patient returned completed cycle 83 Medication Calendar last week.   Plan:  Grade 3 Neutropenic noted while patient still in clinic. Informed Dr. Alvy Bimler of [place] 0.9 and she agrees it is appropriate to hold revlimid and recheck CBC in one week per guidance from study  chair. E-mail from study chair, Dr. Rodena Goldmann, last week regarding grade 3 neutropenia, "Delay cycle. If no resolution, can consider GCSF if clinically thought to be appropriate, or if unable to start in 2 weeks can go off trial."   Dr. Alvy Bimler aware that GCSF allowed on study and does not consider it clinically appropriate for patient.  Notified patient of [place] result and instructed to hold revlimid and return to clinic in one week for another CBC.  Lab appointment scheduled with patient for 7/26 at 7:30 am.   Patient verbalized understanding and in agreement with this plan.  Patient also understands if her [place] continues to be less than 1.0 next week, she will be off trial.   Adverse Event Log Study/Protocol: [facility] ECOG E1A11 Maintenance Cycle 83: 05/16/2021 -06/18/21 (end of Cycle 99= 2/42/6834) Solicited &/or Reportable Events Grade Comments  Anemia Grade 1    Hyperglycemia Grade 0   Lymphocyte count decreased Grade 0   Neutrophil count decreased Grade 3  Revlimid Held, Recheck in one week  Platelet count decreased Grade 0    Diarrhea Grade 0   Dyspnea Grade 0   Edema: limbs Grade 0   Fatigue  Grade 2  Moderate, occasionally limiting ADLs (unchanged from previous)  Fever Grade 0   Insomnia  Grade 2   Trazodone PRN  Irritability Grade 0   Muscle weakness trunk Grade 0   Nausea Grade 0   Peripheral motor neuropathy Grade 0   Peripheral sensory neuropathy  Grade 2 Moderate symptoms, unchanged from previous  Rash acneiform Grade 0    Vomiting Grade 0    Non-reportable AEs (unsolicited, < Grade 3):  Stroke (grade 1)  Moderate pain (grade 2) Back pain Migraine 3. Mild pain (grade 1) Headache        4. Leukopenia (grade 1)       Foye Spurling, BSN, RN Clinical Research Nurse 06/18/2021 8:42 AM

## 2021-06-20 DIAGNOSIS — I200 Unstable angina: Secondary | ICD-10-CM | POA: Diagnosis not present

## 2021-06-20 DIAGNOSIS — E119 Type 2 diabetes mellitus without complications: Secondary | ICD-10-CM | POA: Diagnosis not present

## 2021-06-20 DIAGNOSIS — K219 Gastro-esophageal reflux disease without esophagitis: Secondary | ICD-10-CM | POA: Diagnosis not present

## 2021-06-20 DIAGNOSIS — I10 Essential (primary) hypertension: Secondary | ICD-10-CM | POA: Diagnosis not present

## 2021-06-20 DIAGNOSIS — E1169 Type 2 diabetes mellitus with other specified complication: Secondary | ICD-10-CM | POA: Diagnosis not present

## 2021-06-20 DIAGNOSIS — E039 Hypothyroidism, unspecified: Secondary | ICD-10-CM | POA: Diagnosis not present

## 2021-06-20 DIAGNOSIS — D508 Other iron deficiency anemias: Secondary | ICD-10-CM | POA: Diagnosis not present

## 2021-06-25 ENCOUNTER — Other Ambulatory Visit: Payer: Self-pay | Admitting: *Deleted

## 2021-06-25 ENCOUNTER — Encounter: Payer: Self-pay | Admitting: *Deleted

## 2021-06-25 ENCOUNTER — Inpatient Hospital Stay: Payer: Medicare Other

## 2021-06-25 ENCOUNTER — Other Ambulatory Visit: Payer: Self-pay

## 2021-06-25 DIAGNOSIS — C9001 Multiple myeloma in remission: Secondary | ICD-10-CM

## 2021-06-25 DIAGNOSIS — Z006 Encounter for examination for normal comparison and control in clinical research program: Secondary | ICD-10-CM | POA: Diagnosis not present

## 2021-06-25 LAB — CBC WITH DIFFERENTIAL (CANCER CENTER ONLY)
Abs Immature Granulocytes: 0.01 10*3/uL (ref 0.00–0.07)
Basophils Absolute: 0 10*3/uL (ref 0.0–0.1)
Basophils Relative: 1 %
Eosinophils Absolute: 0.1 10*3/uL (ref 0.0–0.5)
Eosinophils Relative: 2 %
HCT: 36.8 % (ref 36.0–46.0)
Hemoglobin: 11.7 g/dL — ABNORMAL LOW (ref 12.0–15.0)
Immature Granulocytes: 0 %
Lymphocytes Relative: 52 %
Lymphs Abs: 1.8 10*3/uL (ref 0.7–4.0)
MCH: 23.3 pg — ABNORMAL LOW (ref 26.0–34.0)
MCHC: 31.8 g/dL (ref 30.0–36.0)
MCV: 73.3 fL — ABNORMAL LOW (ref 80.0–100.0)
Monocytes Absolute: 0.3 10*3/uL (ref 0.1–1.0)
Monocytes Relative: 10 %
Neutro Abs: 1.2 10*3/uL — ABNORMAL LOW (ref 1.7–7.7)
Neutrophils Relative %: 35 %
Platelet Count: 246 10*3/uL (ref 150–400)
RBC: 5.02 MIL/uL (ref 3.87–5.11)
RDW: 15.5 % (ref 11.5–15.5)
WBC Count: 3.4 10*3/uL — ABNORMAL LOW (ref 4.0–10.5)
nRBC: 0 % (ref 0.0–0.2)

## 2021-06-25 MED ORDER — LENALIDOMIDE 5 MG PO CAPS CTSU E1A11: ORAL_CAPSULE | ORAL | 0 refills | Status: DC

## 2021-06-25 NOTE — Research (Signed)
Research - [facility] ECOG-ACRIN [id number] Maintenance Cycle 84, Day 1- Delayed by Two Weeks  Patient into clinic unaccompanied today for evaluation prior to beginning treatment Cycle 84 of Revlimid. This cycle has been delayed by 2 weeks due to low ANC count (grade 3 neutropenia) the past 2 weeks. This RN met with patient after her lab appointment.    Labs: CBC was drawn per protocol.   Adverse Events Since Last Visit (in the past month): Patient denies any further seizures and has been worked up by Garment/textile technologist. EEG was completed last week but we do not have results available yet. MRI of brain on 05/31/21 showed a small stroke (grade 1) age indeterminate, but last MRI from 2017 did not show this so this apparently occurred sometime in the past 5 years. Per Dr. Alvy Bimler stroke is probably related to revlimid. Patient denies having any stroke symptoms in the past 5 years. Patient confirmed she continues to take aspirin daily as prescribed and she is aware that stroke can be due to taking revlimid. Patient denies any further rash since last visit.  She does not report any further light headedness since last study visit.  Ongoing fatigue (grade 2) not limiting self-care.  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 Frequent moderate back ache (grade 2) is ongoing, which she continues to take methocarbamol and tramadol prn. Patient reports ongoing insomnia (grade 2) and continues to take trazodone prn. Patient reports occasional migraines (grade 2) and taking tramadol prn. She also recently started on emgality for migraines.    Patient continues to exercise regularly swimming laps in the pool 3 times weekly on average.   Maintenance therapy with Revlimid:  Patient returned completed cycle 83 Medication Calendar 2 weeks ago and revlimid has been on hold since then.   Plan:  Grade 3 Neutropenic has resolved to grade 2. Notified patient of [place] result and instructed okay to resume revlimid and  start cycle 84 today per study. Cycle 84 calendar/diary given to patient to record her doses. Next lab only scheduled for 07/16/21 with MD appointment on 07/23/21. Dr. Alvy Bimler aware of CBC results today and agrees with patient resuming revlimid per study.  Patient verbalized understanding and in agreement with this plan.   Adverse Event Log Study/Protocol: [facility] ECOG E1A11 Maintenance Cycle 83: 05/16/2021 -06/25/21 (end of Cycle 16= 12/10/6043) Solicited &/or Reportable Events Grade Comments  Anemia Grade 1    Hyperglycemia Grade 0   Lymphocyte count decreased Grade 0   Neutrophil count decreased Grade 3, down to grade 2 on 06/25/21  Revlimid Held, Recheck in one week  Platelet count decreased Grade 0    Diarrhea Grade 0   Dyspnea Grade 0   Edema: limbs Grade 0   Fatigue  Grade 2  Moderate, occasionally limiting ADLs (unchanged from previous)  Fever Grade 0   Insomnia  Grade 2   Trazodone PRN  Irritability Grade 0   Muscle weakness trunk Grade 0   Nausea Grade 0   Peripheral motor neuropathy Grade 0   Peripheral sensory neuropathy  Grade 2 Moderate symptoms, unchanged from previous  Rash acneiform Grade 0    Vomiting Grade 0    Non-reportable AEs (unsolicited, < Grade 3):  Stroke (grade 1)  Moderate pain (grade 2) Back pain Migraine 3. Mild pain (grade 1) Headache        4. Leukopenia (grade 1)       Foye Spurling, BSN, RN Clinical Research Nurse 06/25/2021 7:40 AM

## 2021-06-26 ENCOUNTER — Encounter: Payer: Self-pay | Admitting: Hematology and Oncology

## 2021-06-26 NOTE — Procedures (Signed)
   HISTORY: 65 year old female, history of chronic migraine, intermittent body paresthesia  TECHNIQUE:  This is a routine 16 channel EEG recording with one channel devoted to a limited EKG recording.  It was performed during wakefulness, drowsiness and asleep.  Hyperventilation and photic stimulation were performed as activating procedures.  There are minimum muscle and movement artifact noted.  Upon maximum arousal, posterior dominant waking rhythm consistent of rhythmic alpha range activity, with frequency of 10 Hz. Activities are symmetric over the bilateral posterior derivations and attenuated with eye opening.  Hyperventilation produced mild/moderate buildup with higher amplitude and the slower activities noted.  Photic stimulation did not alter the tracing.  During EEG recording, patient developed drowsiness and no deeper stage of sleep was achieved.  During EEG recording, there was no epileptiform discharge noted.  EKG demonstrate sinus rhythm, with heart rate of 56 bpm  CONCLUSION: This is a  normal awake EEG.  There is no electrodiagnostic evidence of epileptiform discharge.  Marcial Pacas, M.D. Ph.D.  [facility] [place], [place] [address] Phone: [phone] Fax:      [phone]

## 2021-06-29 ENCOUNTER — Other Ambulatory Visit: Payer: Self-pay | Admitting: Hematology and Oncology

## 2021-06-29 DIAGNOSIS — C9001 Multiple myeloma in remission: Secondary | ICD-10-CM

## 2021-06-29 NOTE — Telephone Encounter (Signed)
Refilled 4 days ago by Dr. Alvy Bimler. Gardiner Rhyme, RN

## 2021-07-02 ENCOUNTER — Other Ambulatory Visit: Payer: Medicare Other

## 2021-07-09 ENCOUNTER — Ambulatory Visit: Payer: Medicare Other | Admitting: Hematology and Oncology

## 2021-07-10 ENCOUNTER — Telehealth: Payer: Self-pay | Admitting: *Deleted

## 2021-07-10 NOTE — Telephone Encounter (Signed)
[id number] Study: Scheduled Bone Scan for Tuesday 8/16 at 9 am.  Informed patient of d/t and to check in at main hospital admitting by 8:45 am.  She confirmed and also aware of lab appointment same day at 7:30 am.   Foye Spurling, BSN, RN Clinical Research Nurse 07/10/2021 12:18 PM

## 2021-07-11 ENCOUNTER — Other Ambulatory Visit: Payer: Medicare Other

## 2021-07-16 ENCOUNTER — Telehealth: Payer: Self-pay | Admitting: *Deleted

## 2021-07-16 ENCOUNTER — Other Ambulatory Visit: Payer: Self-pay

## 2021-07-16 ENCOUNTER — Inpatient Hospital Stay: Payer: Medicare Other | Attending: Hematology and Oncology

## 2021-07-16 ENCOUNTER — Ambulatory Visit (HOSPITAL_COMMUNITY)
Admission: RE | Admit: 2021-07-16 | Discharge: 2021-07-16 | Disposition: A | Discharge status: Home / Self Care | Payer: Medicare Other | Source: Ambulatory Visit | Attending: Hematology and Oncology | Admitting: Hematology and Oncology

## 2021-07-16 DIAGNOSIS — R946 Abnormal results of thyroid function studies: Secondary | ICD-10-CM | POA: Diagnosis present

## 2021-07-16 DIAGNOSIS — C9001 Multiple myeloma in remission: Secondary | ICD-10-CM | POA: Insufficient documentation

## 2021-07-16 DIAGNOSIS — Z006 Encounter for examination for normal comparison and control in clinical research program: Secondary | ICD-10-CM | POA: Diagnosis not present

## 2021-07-16 DIAGNOSIS — C9000 Multiple myeloma not having achieved remission: Secondary | ICD-10-CM | POA: Diagnosis not present

## 2021-07-16 LAB — CBC WITH DIFFERENTIAL (CANCER CENTER ONLY)
Abs Immature Granulocytes: 0.02 10*3/uL (ref 0.00–0.07)
Basophils Absolute: 0 10*3/uL (ref 0.0–0.1)
Basophils Relative: 0 %
Eosinophils Absolute: 0.1 10*3/uL (ref 0.0–0.5)
Eosinophils Relative: 2 %
HCT: 37.2 % (ref 36.0–46.0)
Hemoglobin: 11.5 g/dL — ABNORMAL LOW (ref 12.0–15.0)
Immature Granulocytes: 1 %
Lymphocytes Relative: 51 %
Lymphs Abs: 1.6 10*3/uL (ref 0.7–4.0)
MCH: 23 pg — ABNORMAL LOW (ref 26.0–34.0)
MCHC: 30.9 g/dL (ref 30.0–36.0)
MCV: 74.4 fL — ABNORMAL LOW (ref 80.0–100.0)
Monocytes Absolute: 0.5 10*3/uL (ref 0.1–1.0)
Monocytes Relative: 15 %
Neutro Abs: 1 10*3/uL — ABNORMAL LOW (ref 1.7–7.7)
Neutrophils Relative %: 31 %
Platelet Count: 188 10*3/uL (ref 150–400)
RBC: 5 MIL/uL (ref 3.87–5.11)
RDW: 14.9 % (ref 11.5–15.5)
WBC Count: 3.1 10*3/uL — ABNORMAL LOW (ref 4.0–10.5)
nRBC: 0 % (ref 0.0–0.2)

## 2021-07-16 LAB — CMP (CANCER CENTER ONLY)
ALT: 29 U/L (ref 0–44)
AST: 24 U/L (ref 15–41)
Albumin: 3.7 g/dL (ref 3.5–5.0)
Alkaline Phosphatase: 54 U/L (ref 38–126)
Anion gap: 10 (ref 5–15)
BUN: 9 mg/dL (ref 8–23)
CO2: 23 mmol/L (ref 22–32)
Calcium: 8.9 mg/dL (ref 8.9–10.3)
Chloride: 107 mmol/L (ref 98–111)
Creatinine: 0.9 mg/dL (ref 0.44–1.00)
GFR, Estimated: >60 mL/min (ref >60)
Glucose, Bld: 97 mg/dL (ref 70–99)
Potassium: 3.6 mmol/L (ref 3.5–5.1)
Sodium: 140 mmol/L (ref 135–145)
Total Bilirubin: 0.5 mg/dL (ref 0.3–1.2)
Total Protein: 7.4 g/dL (ref 6.5–8.1)

## 2021-07-16 LAB — LACTATE DEHYDROGENASE: LDH: 181 U/L (ref 98–192)

## 2021-07-16 LAB — TSH: TSH: 1.455 u[IU]/mL (ref 0.308–3.960)

## 2021-07-16 NOTE — Telephone Encounter (Signed)
[id number] Study: Informed patient of ANC and overall CBC results today are okay and no need to repeat CBC next week. Patient is scheduled to see Dr. Alvy Bimler next Tuesday at 8:00 am and will receive other lab and bone survey results at that time as they are not available yet.  Patient verbalized understanding and states she will order her next cycle of Revlimid.  Foye Spurling, BSN, RN Clinical Research Nurse 07/16/2021 9:07 AM

## 2021-07-17 LAB — KAPPA/LAMBDA LIGHT CHAINS
Kappa free light chain: 28.3 mg/L — ABNORMAL HIGH (ref 3.3–19.4)
Kappa, lambda light chain ratio: 1.48 (ref 0.26–1.65)
Lambda free light chains: 19.1 mg/L (ref 5.7–26.3)

## 2021-07-18 ENCOUNTER — Ambulatory Visit: Payer: Medicare Other | Admitting: Hematology and Oncology

## 2021-07-19 ENCOUNTER — Telehealth: Payer: Self-pay | Admitting: *Deleted

## 2021-07-19 ENCOUNTER — Other Ambulatory Visit: Payer: Self-pay

## 2021-07-19 DIAGNOSIS — C9001 Multiple myeloma in remission: Secondary | ICD-10-CM

## 2021-07-19 LAB — MULTIPLE MYELOMA PANEL, SERUM
Albumin SerPl Elph-Mcnc: 3.4 g/dL (ref 2.9–4.4)
Albumin/Glob SerPl: 1.1 (ref 0.7–1.7)
Alpha 1: 0.2 g/dL (ref 0.0–0.4)
Alpha2 Glob SerPl Elph-Mcnc: 0.6 g/dL (ref 0.4–1.0)
B-Globulin SerPl Elph-Mcnc: 1 g/dL (ref 0.7–1.3)
Gamma Glob SerPl Elph-Mcnc: 1.6 g/dL (ref 0.4–1.8)
Globulin, Total: 3.4 g/dL (ref 2.2–3.9)
IgA: 117 mg/dL (ref 87–352)
IgG (Immunoglobin G), Serum: 1721 mg/dL — ABNORMAL HIGH (ref 586–1602)
IgM (Immunoglobulin M), Srm: 44 mg/dL (ref 26–217)
Total Protein ELP: 6.8 g/dL (ref 6.0–8.5)

## 2021-07-19 MED ORDER — LENALIDOMIDE 5 MG PO CAPS CTSU E1A11: ORAL_CAPSULE | ORAL | 0 refills | Status: DC

## 2021-07-19 NOTE — Telephone Encounter (Signed)
Faxed Revlimid Rx to [facility]. Received confirmation.

## 2021-07-19 NOTE — Telephone Encounter (Signed)
Patient left VM requests refill Rx for Revlimd.  Next cycle due to start Tuesday 07/16/21.  Foye Spurling, BSN, RN Clinical Research Nurse 07/19/2021 8:55 AM

## 2021-07-23 ENCOUNTER — Encounter: Payer: Self-pay | Admitting: Hematology and Oncology

## 2021-07-23 ENCOUNTER — Inpatient Hospital Stay: Payer: Medicare Other | Admitting: Hematology and Oncology

## 2021-07-23 ENCOUNTER — Other Ambulatory Visit: Payer: Self-pay

## 2021-07-23 ENCOUNTER — Encounter: Payer: Self-pay | Admitting: *Deleted

## 2021-07-23 DIAGNOSIS — C9001 Multiple myeloma in remission: Secondary | ICD-10-CM

## 2021-07-23 DIAGNOSIS — Z006 Encounter for examination for normal comparison and control in clinical research program: Secondary | ICD-10-CM

## 2021-07-23 DIAGNOSIS — D61811 Other drug-induced pancytopenia: Secondary | ICD-10-CM | POA: Diagnosis not present

## 2021-07-23 MED ORDER — LENALIDOMIDE 5 MG PO CAPS CTSU E1A11: ORAL_CAPSULE | ORAL | 0 refills | Status: DC

## 2021-07-23 NOTE — Assessment & Plan Note (Signed)
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

## 2021-07-23 NOTE — Assessment & Plan Note (Signed)
This is likely due to recent treatment. The patient denies recent history of fevers, cough, chills, diarrhea or dysuria. She is asymptomatic from the leukopenia. I will observe for now.  I will continue the chemotherapy at current dose without dosage adjustment.  If the leukopenia gets progressive worse in the future, I might have to delay her treatment or adjust the chemotherapy dose per protocol

## 2021-07-23 NOTE — Research (Signed)
Research - [facility] ECOG-ACRIN [id number] Maintenance Cycle 85, Day 1  Patient into clinic unaccompanied today for evaluation prior to beginning treatment Cycle 85 of revlimid.   Labs w/myeloma panel: Obtained on 07/16/21 within 7-day window per protocol, to allow adequate time for results prior to treatment. Results reviewed by Dr. Alvy Bimler and found to be within parameters for continued dosing at Maintenance phase Dose Level -3, Revlimid 5 mg every other day for 21 days, every 28 days. 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 Per MD, bone marrow biopsy and aspirate would not be considered standard of care at this time.   History and physical exam: See MD note dated today.     Adverse Events Since Last Visit (in the past month): Patient reports ongoing fatigue (grade 2) not limiting self-care.   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 Patient notes occasional mild bone pain (grade 1).  Daily moderate back ache (grade 2) is ongoing, which she continues to take methocarbamol occasionally (once in the past month). Patient denies diarrhea. She has had insomnia (grade 2) and taken trazadone a few times this past month. She did not report any neurological symptoms. Patient continues to exercise regularly swimming laps in the pool 3 times weekly on average.  See AE table below for cycles 82-84.  Maintenance therapy with Revlimid: Based on lab results, AE review and history and physical by Dr. Alvy Bimler, patient meets criteria for continued treatment with Revlimid, which she is in agreement to do. Patient is aware that treatment will continue indefinitely, unless there is evidence of disease recurrence, adverse effects requiring treatment discontinuation, or patient/physician decision to discontinue therapy. Patient returned completed cycle 84 Medication Calendar, confirming dosing at dose level -3, Revlimid 71m every other day, Days 1-21. Patient notes that  no doses were missed. Patient given printed appointment calendar with Cycle 85 treatment days marked, to document doses for the next treatment cycle, scheduled to begin today.  Plan: Patient will continue with monthly clinic visits with lab. These are due on 08/20/21 and 09/17/21 for hematology and AE assessments prior to beginning subsequent treatment cycles. Myeloma panel and other labs, to be performed on 10/08/2021 in advance of the next MD clinic visit at the end of Cycle 87, scheduled for 10/15/2021. Reminded patient to call our office if any new questions/concerns prior to next visit. She verbalized understanding.   Adverse Event Log Study/Protocol: [facility] ECOG E1A11 Maintenance Cycles 82-84: 04/16/2021 - 07/23/21 (end of Cycle 84 = 051/70/0174)`Solicited &/or Reportable Events Grade Attribution to lenalidomide Cycle # Comments  Anemia Grade 1 Definite 82, 83,84    Hyperglycemia Grade 0 -    Lymphocyte count decreased Grade 0 -    Neutrophil count decreased Grade 2  Definite 82,84   Neutrophil count decreased Grade 3 Definite      83 Revlimid held x 2 weeks  Platelet count decreased Grade 0 -     Diarrhea Grade 0     Dyspnea Grade 0 -    Edema: limbs Grade 0 -    Fatigue  Grade 2  Probable 82,83, 84 Moderate, occasionally limiting ADLs (unchanged from previous)  Fever Grade 0 -    Insomnia  Grade 2   Unlikely 82,83, 84 Trazodone PRN  Irritability Grade 0 -    Muscle weakness trunk Grade 0 -    Nausea Grade 1 Unlikely 82   Peripheral motor neuropathy Grade 0 -    Peripheral sensory neuropathy  Grade 2 Unlikely 82,83, 84 Moderate symptoms, unchanged from previous  Rash acneiform Grade 2 Unrelated 82 Unrelated to Revlimid  Vomiting Grade 0 -     Non-reportable AEs (unsolicited, < Grade 3): Leukopenia (grade 1) Moderate pain (grade 2) Back pain Migraine 3.   Mild pain (grade 1) Headache Bone pain 4.   Seizure (grade 2)- unrelated to revlimid 5.   Lightheadedness (grade 1)  6.    Stroke (grade 1)- probably related to revlimid    Foye Spurling, BSN, RN Clinical Research Nurse 07/23/2021

## 2021-07-23 NOTE — Progress Notes (Signed)
[facility] OFFICE PROGRESS NOTE  Patient Care Team: Wenda Low, MD as PCP - General (Internal Medicine) Heath Lark, MD as Consulting Physician (Hematology and Oncology)  ASSESSMENT & PLAN:  Multiple myeloma in remission [facility]) Her recent myeloma panel showed that she is in remission The patient is comfortable to remain on Revlimid indefinitely. She will continue treatment per research protocol She will continue calcium with vitamin D supplement and aspirin for DVT prophylaxis She is not on Zometa due to history of osteonecrosis of the jaw   Drug-induced pancytopenia ([place]) This is likely due to recent treatment. The patient denies recent history of fevers, cough, chills, diarrhea or dysuria. She is asymptomatic from the leukopenia. I will observe for now.  I will continue the chemotherapy at current dose without dosage adjustment.  If the leukopenia gets progressive worse in the future, I might have to delay her treatment or adjust the chemotherapy dose per protocol   No orders of the defined types were placed in this encounter.   All questions were answered. The patient knows to call the clinic with any problems, questions or concerns. The total time spent in the appointment was 20 minutes encounter with patients including review of chart and various tests results, discussions about plan of care and coordination of care plan   Heath Lark, MD 07/23/2021 10:25 AM  INTERVAL HISTORY: Please see below for problem oriented charting. She returns for treatment and toxicity review She is doing well No recent infection No new bone pain She have lost some weight since last time I saw her  SUMMARY OF ONCOLOGIC HISTORY: Oncology History Overview Note  ISS stage 1 IgG lambda subtype (serum albumin 3.6, Beta2 microglobulin 2.32) Durie Salmon Stage 1   Multiple myeloma in remission ([place])  10/10/2013 Imaging   Skeletal survery was negative   11/09/2013 Bone Marrow Biopsy    BM biopsy confirmed myeloma, 76% involved, IgG lambda subtype   12/06/2013 - 08/29/2014 Chemotherapy   Sh received chemo with revlimid, Velcade, Dexamethasone and Zometa. Patient particpated in clinical research [facility] [phone]   02/23/2014 Bone Marrow Biopsy   Repeat bone marrow biopsy showed 5% involvement   03/31/2014 Adverse Reaction   Zometa was discontinued due to osteonecrosis of the jaw.   05/05/2014 Imaging   Imaging study of the neck showed no explanation that could cause right neck pain. She is noted to have incidental left upper lung nodule. Plan to repeat imaging study in 3 months.   09/06/2014 Imaging   Bone survey showed no evidence of fracture   09/14/2014 Bone Marrow Biopsy   Bone marrow biopsy showed 8% residual plasma cells by manual count but none on the biopsy specimen   09/26/2014 -  Chemotherapy   She is started on cycle 1 of maintenance Revlimid   01/31/2015 Imaging    chest x-ray showed pneumonia. Treatment was placed on hold.   05/03/2015 Bone Marrow Biopsy   Accession: [id number] repeat bone marrow aspirate and biopsy show 5% residual plasma cells   10/14/2016 Bone Marrow Biopsy   Bone marrow biopsy showed the plasma cells represent 4% of all cells with lack of large aggregates or sheets. To assess the plasma cell clonality, immunohistochemical stains is performed and it lack clonality. Normal cytogenetics and FISH   01/09/2017 Imaging   CT chest showed ground-glass 1.5 cm apical left upper lobe pulmonary nodule. Initial follow-up with CT at 6-12 months is recommended to confirm persistence. If persistent, repeat CT is recommended every 2  years until 5 years of stability has been established. This recommendation follows the consensus statement: Guidelines for Management of Incidental Pulmonary Nodules Detected on CT Images: From the Fleischner Society 2017; Radiology 2017; 284:228-243. 2. Mild patchy ground-glass opacities in the right upper lobe, probably inflammatory,  which can also be reassessed on follow-up chest CT performed for the above dominant ground-glass nodule. 3. Solitary 3 mm right lower lobe solid pulmonary nodule, which can also be reassessed on follow-up chest CT performed for the above dominant ground-glass nodule. 4. No thoracic adenopathy. 5. Aortic atherosclerosis.  Two-vessel coronary atherosclerosis.   06/30/2017 Imaging   1. No interval change in the 11 x 13 mm ground-glass nodule left upper lobe. Given the nearly 6 months of imaging stability, repeat CT is recommended every 2 years until 5 years of stability has been established. This recommendation follows the consensus statement: Guidelines for Management of Incidental Pulmonary Nodules Detected on CT Images: From the Fleischner Society 2017; Radiology 2017; 284:228-243. 2. Interval resolution of the tiny patchy ground-glass nodules right upper lobe, likely infectious/inflammatory etiology. 3. Stable 3 mm right lower lobe pulmonary nodule. Attention on follow-up recommended. 4.  Aortic Atherosclerois (ICD10-170.0)   09/29/2017 Imaging   Skeletal survey 1. Questionable new tiny lucency noted the posterior portion of C3 vertebral body. This may represent a small lytic lesion.  2. No definite thoracic lesion noted on today's exam. Stable lucencies in the left ilium and acetabulum.  3. No other focal abnormalities identified. The left hip is unremarkable .   04/16/2021 -  Chemotherapy    Patient is on Treatment Plan: MYELOMA RESEARCH [facility] E1A11 ARM D MAINTENANCE LENALIDOMIDE Q28D         REVIEW OF SYSTEMS:   Constitutional: Denies fevers, chills or abnormal weight loss Eyes: Denies blurriness of vision Ears, nose, mouth, throat, and face: Denies mucositis or sore throat Respiratory: Denies cough, dyspnea or wheezes Cardiovascular: Denies palpitation, chest discomfort or lower extremity swelling Gastrointestinal:  Denies nausea, heartburn or change in bowel habits Skin: Denies  abnormal skin rashes Lymphatics: Denies new lymphadenopathy or easy bruising Neurological:Denies numbness, tingling or new weaknesses Behavioral/Psych: Mood is stable, no new changes  All other systems were reviewed with the patient and are negative.  I have reviewed the past medical history, past surgical history, social history and family history with the patient and they are unchanged from previous note.  ALLERGIES:  is allergic to metformin.  MEDICATIONS:  Current Outpatient Medications  Medication Sig Dispense Refill   aspirin 325 MG tablet Take 325 mg by mouth daily.      Cholecalciferol (VITAMIN D3) 2000 UNITS TABS Take 2,000 Units by mouth daily.     dorzolamide-timolol (COSOPT) 22.3-6.8 MG/ML ophthalmic solution Place 1 drop into both eyes 2 (two) times daily.     Galcanezumab-gnlm (EMGALITY) 120 MG/ML SOAJ Inject 120 mg into the skin every 30 (thirty) days. 6 mL 11   hydrochlorothiazide (HYDRODIURIL) 25 MG tablet Take 25 mg by mouth every evening.      lenalidomide (REVLIMID) 5 MG capsule Take 1 capsule daily every other day on days 1-21. Repeat every 28 days. 21 capsule 0   levothyroxine (SYNTHROID, LEVOTHROID) 75 MCG tablet Take 75 mcg by mouth daily before breakfast.     methocarbamol (ROBAXIN) 500 MG tablet Take 1 tablet (500 mg total) by mouth every 8 (eight) hours as needed for muscle spasms. 90 tablet 1   Multiple Vitamins-Minerals (CENTRUM SILVER PO) Take 1 tablet by mouth daily.  polyethylene glycol (MIRALAX / GLYCOLAX) packet Take 17 g by mouth daily as needed for mild constipation.      RESTASIS 0.05 % ophthalmic emulsion PLACE 1 DROP IN BOTH EYES IN THE EVENING  4   rosuvastatin (CRESTOR) 5 MG tablet Take 5 mg by mouth daily at 2 PM.     sitaGLIPtin (JANUVIA) 50 MG tablet Take 50 mg by mouth daily.     traZODone (DESYREL) 50 MG tablet TAKE 1 TABLET (50 MG TOTAL) BY MOUTH AT BEDTIME. 90 tablet 3   venlafaxine XR (EFFEXOR-XR) 150 MG 24 hr capsule Take 1 capsule (150  mg total) by mouth daily with breakfast. 90 capsule 3   verapamil (VERELAN PM) 120 MG 24 hr capsule Take 1 capsule (120 mg total) by mouth daily. 90 capsule 3   No current facility-administered medications for this visit.   Facility-Administered Medications Ordered in Other Visits  Medication Dose Route Frequency Provider Last Rate Last Admin   gadopentetate dimeglumine (MAGNEVIST) injection 15 mL  15 mL Intravenous Once PRN Melvenia Beam, MD        PHYSICAL EXAMINATION: ECOG PERFORMANCE STATUS: 0 - Asymptomatic  Vitals:   07/23/21 0750  BP: 132/78  Pulse: 64  Resp: 18  Temp: 97.9 F (36.6 C)  SpO2: 100%   Filed Weights   07/23/21 0750  Weight: 164 lb 9.6 oz (74.7 kg)    GENERAL:alert, no distress and comfortable SKIN: skin color, texture, turgor are normal, no rashes or significant lesions EYES: normal, Conjunctiva are pink and non-injected, sclera clear OROPHARYNX:no exudate, no erythema and lips, buccal mucosa, and tongue normal  NECK: supple, thyroid normal size, non-tender, without nodularity LYMPH:  no palpable lymphadenopathy in the cervical, axillary or inguinal LUNGS: clear to auscultation and percussion with normal breathing effort HEART: regular rate & rhythm and no murmurs and no lower extremity edema ABDOMEN:abdomen soft, non-tender and normal bowel sounds Musculoskeletal:no cyanosis of digits and no clubbing  NEURO: alert & oriented x 3 with fluent speech, no focal motor/sensory deficits  LABORATORY DATA:  I have reviewed the data as listed    Component Value Date/Time   NA 140 07/16/2021 0722   NA 141 09/29/2017 0830   K 3.6 07/16/2021 0722   K 3.6 09/29/2017 0830   CL 107 07/16/2021 0722   CO2 23 07/16/2021 0722   CO2 29 09/29/2017 0830   GLUCOSE 97 07/16/2021 0722   GLUCOSE 87 09/29/2017 0830   BUN 9 07/16/2021 0722   BUN 14.0 09/29/2017 0830   CREATININE 0.90 07/16/2021 0722   CREATININE 0.9 09/29/2017 0830   CALCIUM 8.9 07/16/2021 0722    CALCIUM 9.6 09/29/2017 0830   PROT 7.4 07/16/2021 0722   PROT 7.3 09/29/2017 0832   PROT 8.0 09/29/2017 0830   ALBUMIN 3.7 07/16/2021 0722   ALBUMIN 3.8 09/29/2017 0830   AST 24 07/16/2021 0722   AST 21 09/29/2017 0830   ALT 29 07/16/2021 0722   ALT 69 (H) 09/29/2017 0830   ALKPHOS 54 07/16/2021 0722   ALKPHOS 60 09/29/2017 0830   BILITOT 0.5 07/16/2021 0722   BILITOT 0.46 09/29/2017 0830   GFRNONAA >60 07/16/2021 0722   GFRAA >60 07/31/2020 0737    No results found for: SPEP, UPEP  Lab Results  Component Value Date   WBC 3.1 (L) 07/16/2021   NEUTROABS 1.0 (L) 07/16/2021   HGB 11.5 (L) 07/16/2021   HCT 37.2 07/16/2021   MCV 74.4 (L) 07/16/2021   PLT 188 07/16/2021  Chemistry      Component Value Date/Time   NA 140 07/16/2021 0722   NA 141 09/29/2017 0830   K 3.6 07/16/2021 0722   K 3.6 09/29/2017 0830   CL 107 07/16/2021 0722   CO2 23 07/16/2021 0722   CO2 29 09/29/2017 0830   BUN 9 07/16/2021 0722   BUN 14.0 09/29/2017 0830   CREATININE 0.90 07/16/2021 0722   CREATININE 0.9 09/29/2017 0830      Component Value Date/Time   CALCIUM 8.9 07/16/2021 0722   CALCIUM 9.6 09/29/2017 0830   ALKPHOS 54 07/16/2021 0722   ALKPHOS 60 09/29/2017 0830   AST 24 07/16/2021 0722   AST 21 09/29/2017 0830   ALT 29 07/16/2021 0722   ALT 69 (H) 09/29/2017 0830   BILITOT 0.5 07/16/2021 0722   BILITOT 0.46 09/29/2017 0830       RADIOGRAPHIC STUDIES: I have personally reviewed the radiological images as listed and agreed with the findings in the report. DG Bone Survey Met  Result Date: 07/16/2021 CLINICAL DATA:  History of multiple myeloma.  Patient in remission. EXAM: METASTATIC BONE SURVEY COMPARISON:  07/31/2020.  09/29/2017. FINDINGS: Standard metastatic bone survey obtained with imaging of the axial and appendicular skeleton. No new lytic or blastic abnormalities identified. Stable sclerotic and lucent changes in the left pelvis with stable deformity. Diffuse  degenerative change thoracolumbar spine again noted. No acute bony abnormality identified. IMPRESSION: No new lytic or blastic abnormalities identified. Exam stable from prior exam. Electronically Signed   By: Marcello Moores  Register M.D.   On: 07/16/2021 09:58

## 2021-07-25 ENCOUNTER — Encounter: Payer: Self-pay | Admitting: Hematology and Oncology

## 2021-08-08 DIAGNOSIS — H401111 Primary open-angle glaucoma, right eye, mild stage: Secondary | ICD-10-CM | POA: Diagnosis not present

## 2021-08-08 DIAGNOSIS — H04123 Dry eye syndrome of bilateral lacrimal glands: Secondary | ICD-10-CM | POA: Diagnosis not present

## 2021-08-08 DIAGNOSIS — H40022 Open angle with borderline findings, high risk, left eye: Secondary | ICD-10-CM | POA: Diagnosis not present

## 2021-08-11 ENCOUNTER — Other Ambulatory Visit: Payer: Self-pay | Admitting: Hematology and Oncology

## 2021-08-11 DIAGNOSIS — C9001 Multiple myeloma in remission: Secondary | ICD-10-CM

## 2021-08-12 ENCOUNTER — Encounter: Payer: Self-pay | Admitting: Hematology and Oncology

## 2021-08-12 NOTE — Telephone Encounter (Signed)
Pls refill electronically °

## 2021-08-20 ENCOUNTER — Other Ambulatory Visit: Payer: Self-pay

## 2021-08-20 ENCOUNTER — Encounter: Payer: Self-pay | Admitting: Hematology and Oncology

## 2021-08-20 ENCOUNTER — Other Ambulatory Visit: Payer: Self-pay | Admitting: *Deleted

## 2021-08-20 ENCOUNTER — Encounter: Payer: Self-pay | Admitting: *Deleted

## 2021-08-20 ENCOUNTER — Inpatient Hospital Stay: Payer: Medicare Other | Attending: Hematology and Oncology

## 2021-08-20 DIAGNOSIS — C9001 Multiple myeloma in remission: Secondary | ICD-10-CM | POA: Insufficient documentation

## 2021-08-20 LAB — CBC WITH DIFFERENTIAL (CANCER CENTER ONLY)
Abs Immature Granulocytes: 0.01 10*3/uL (ref 0.00–0.07)
Basophils Absolute: 0 10*3/uL (ref 0.0–0.1)
Basophils Relative: 1 %
Eosinophils Absolute: 0.1 10*3/uL (ref 0.0–0.5)
Eosinophils Relative: 3 %
HCT: 36.6 % (ref 36.0–46.0)
Hemoglobin: 11.2 g/dL — ABNORMAL LOW (ref 12.0–15.0)
Immature Granulocytes: 0 %
Lymphocytes Relative: 50 %
Lymphs Abs: 1.8 10*3/uL (ref 0.7–4.0)
MCH: 22.6 pg — ABNORMAL LOW (ref 26.0–34.0)
MCHC: 30.6 g/dL (ref 30.0–36.0)
MCV: 73.9 fL — ABNORMAL LOW (ref 80.0–100.0)
Monocytes Absolute: 0.5 10*3/uL (ref 0.1–1.0)
Monocytes Relative: 14 %
Neutro Abs: 1.2 10*3/uL — ABNORMAL LOW (ref 1.7–7.7)
Neutrophils Relative %: 32 %
Platelet Count: 202 10*3/uL (ref 150–400)
RBC: 4.95 MIL/uL (ref 3.87–5.11)
RDW: 14.8 % (ref 11.5–15.5)
WBC Count: 3.6 10*3/uL — ABNORMAL LOW (ref 4.0–10.5)
nRBC: 0 % (ref 0.0–0.2)

## 2021-08-20 MED ORDER — LENALIDOMIDE 5 MG PO CAPS CTSU E1A11
ORAL_CAPSULE | ORAL | 0 refills | Status: DC
Start: 2021-08-20 — End: 2021-09-09

## 2021-08-20 NOTE — Research (Signed)
Research - [facility] ECOG-ACRIN [id number] Maintenance Cycle 86, Day 1  Patient into clinic unaccompanied today for evaluation prior to beginning treatment Cycle 86 of revlimid.   Labs: CBC drawn per protocol.   Adverse Events Since Last Visit (in the past month): Patient reports ongoing fatigue (grade 2) not limiting self-care.   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 Daily moderate back ache (grade 2) is ongoing. She had insomnia (grade 2) and ttook trazadone once this past month. Patient continues to exercise regularly swimming laps in the pool 3 times weekly on average.  See AE table below.  Maintenance therapy with Revlimid: Patient returned completed cycle 85 Medication Calendar, confirming dosing at dose level -3, Revlimid 5mg  every other day, Days 1-21. Patient notes that no doses were missed. Patient given printed appointment calendar with Cycle 86 treatment days marked, to document doses for the next treatment cycle, scheduled to begin today pending lab results.  Plan: This research nurse will call patient after review of today's lab results and notify if okay to start cycle 86 of Revlimid today as planned. Next appointment scheduled on 09/17/21 for lab work and AE assessment prior to starting on next cycle of Revlimid.  Patient verbalized understanding and in agreement with this plan. Reminded patient to call our office if any new questions/concerns prior to next visit. She verbalized understanding.   Adverse Event Log Study/Protocol: [facility] ECOG E1A11 Maintenance Cycle 85 07/23/21-08/20/2021 (end of Cycle 85 = 86/57/8469) Solicited &/or Reportable Events Grade Cycle # Comments  Anemia Grade 1 85    Hyperglycemia Grade 0    Lymphocyte count decreased Grade 0    Neutrophil count decreased Grade 2  85   Platelet count decreased Grade 0     Diarrhea Grade 0    Dyspnea Grade 0    Edema: limbs Grade 0    Fatigue  Grade 2  85 Moderate, occasionally limiting ADLs  (unchanged from previous)  Fever Grade 0    Insomnia  Grade 2   85 Trazodone PRN  Irritability Grade 0    Muscle weakness trunk Grade 0    Nausea Grade 0    Peripheral motor neuropathy Grade 0    Peripheral sensory neuropathy  Grade 2 85 Moderate symptoms, unchanged from previous  Rash acneiform Grade 0    Vomiting Grade 0     Non-reportable AEs (unsolicited, < Grade 3): Leukopenia (grade 1) Moderate pain (grade 2) Back pain  8:00 am Called patient's home and left message with patient's husband informing lab results okay today and for patient to start Revlimid as planned. Arnell Sieving verbalized understanding and states will give message to patient.    Foye Spurling, BSN, RN Clinical Research Nurse 08/20/2021

## 2021-08-21 DIAGNOSIS — E1169 Type 2 diabetes mellitus with other specified complication: Secondary | ICD-10-CM | POA: Diagnosis not present

## 2021-08-21 DIAGNOSIS — I10 Essential (primary) hypertension: Secondary | ICD-10-CM | POA: Diagnosis not present

## 2021-08-21 DIAGNOSIS — K219 Gastro-esophageal reflux disease without esophagitis: Secondary | ICD-10-CM | POA: Diagnosis not present

## 2021-08-21 DIAGNOSIS — E119 Type 2 diabetes mellitus without complications: Secondary | ICD-10-CM | POA: Diagnosis not present

## 2021-08-21 DIAGNOSIS — E039 Hypothyroidism, unspecified: Secondary | ICD-10-CM | POA: Diagnosis not present

## 2021-08-21 DIAGNOSIS — D508 Other iron deficiency anemias: Secondary | ICD-10-CM | POA: Diagnosis not present

## 2021-08-21 DIAGNOSIS — I200 Unstable angina: Secondary | ICD-10-CM | POA: Diagnosis not present

## 2021-08-26 ENCOUNTER — Telehealth: Payer: Self-pay

## 2021-08-26 ENCOUNTER — Telehealth (INDEPENDENT_AMBULATORY_CARE_PROVIDER_SITE_OTHER): Payer: Medicare Other | Admitting: Neurology

## 2021-08-26 ENCOUNTER — Encounter: Payer: Self-pay | Admitting: Neurology

## 2021-08-26 DIAGNOSIS — I6381 Other cerebral infarction due to occlusion or stenosis of small artery: Secondary | ICD-10-CM | POA: Diagnosis not present

## 2021-08-26 DIAGNOSIS — G43709 Chronic migraine without aura, not intractable, without status migrainosus: Secondary | ICD-10-CM

## 2021-08-26 MED ORDER — EMGALITY 120 MG/ML ~~LOC~~ SOAJ: 120.0000 mg | SUBCUTANEOUS | 11 refills | Status: DC

## 2021-08-26 NOTE — Telephone Encounter (Signed)
I submitted a PA request for [facility] on CMM, Key: [id number] - PA Case ID: [id number].   Awaiting determination from [facility]

## 2021-08-26 NOTE — Patient Instructions (Signed)
Continue Emgality, discussed lacunar stroke (see below)  Lacunar Stroke A lacunar stroke (lacunar infarction) happens when an area of the brain does not get enough oxygen and blood flow. This can lead to permanent brain damage. A lacunar stroke is a medical emergency. It must be treated right away. What are the causes? This condition is caused by a blockage in a small artery deep in the brain. This may be due to: A buildup of fatty deposits that causes the arteries to narrow (atherosclerosis). This blocks blood flow in the brain. High blood pressure. What increases the risk? You are more likely to develop this condition if you: Have high blood pressure (hypertension). Have high cholesterol (hyperlipidemia). Smoke. Have diabetes. Have heart disease or artery disease, such as carotid artery disease or peripheral artery disease. Are age 65 or older. Have a personal or family history of stroke. What are the signs or symptoms? Symptoms of this condition usually develop suddenly. They may include: Weakness or numbness in your face, arm, or leg, especially on one side of your body. Trouble walking or moving your arms or legs. Loss of balance or coordination. Slurred speech. Vision problems. Dizziness. Nausea and vomiting. Severe headache. How is this diagnosed? This condition may be diagnosed based on: Your symptoms and medical history. A physical exam. Blood tests. A CT scan or MRI of the brain. How is this treated? This condition must be treated within 3-4 hours of the start of the stroke. The goal is to restore blood flow to the brain as soon as possible. Treatments may include: Medicine given through an IV to dissolve the blood clot. Blood thinners (antiplatelets). Medicines to control blood pressure. Your health care provider may prescribe blood thinners to lower your risk of another stroke. Follow these instructions at home: Medicines Take over-the-counter and prescription  medicines only as told by your health care provider. If you were told to take a medicine to thin your blood, such as aspirin, take it exactly as told by your health care provider. Taking too much blood-thinning medicine can cause bleeding. Taking too little may not protect you against a stroke and other problems. Eating and drinking Follow instructions from your health care provider about diet. Eat healthy foods. This includes plenty of fruits and vegetables, lean meats, whole grains, and low-fat dairy products. Avoid foods high in saturated fat, trans fat, or sodium. If you drink alcohol: Limit how much you have to: 0-1 drink a day for women. 0-2 drinks a day for men. Know how much alcohol is in your drink. In the [place] one drink equals 12 oz of beer, 5 oz of wine, or 1 oz of hard liquor. Safety If you need help walking, use a cane or walker as told by your health care provider. Take steps to lower the risk of falls in your home. This may include: Using raised toilets and a seat in the shower. Removing clutter and tripping hazards, such as cords or area rugs. Installing grab bars in the bedroom and bathroom. Activity Exercise regularly, as told by your health care provider. Take part in rehabilitation programs as told by your health care provider. This may include physical therapy, occupational therapy, or speech therapy. General instructions Do not use any products that contain nicotine or tobacco. These products include cigarettes, chewing tobacco, and vaping devices, such as e-cigarettes. If you need help quitting, ask your health care provider. Keep all follow-up visits. This is important. Get help right away if:  You have any  symptoms of stroke. "BE FAST" is an easy way to remember the main warning signs of stroke: B - Balance. Signs are dizziness, sudden trouble walking, or loss of balance. E - Eyes. Signs are trouble seeing or a sudden change in vision. F - Face. Signs are  sudden weakness or numbness of the face, or the face or eyelid drooping on one side. A - Arms. Signs are weakness or numbness in an arm. This happens suddenly and usually on one side of the body. S - Speech. Signs are sudden trouble speaking, slurred speech, or trouble understanding what people say. T - Time. Time to call emergency services. Write down what time symptoms started. You have other signs of stroke, such as: A sudden, severe headache. Nausea or vomiting. Seizure. You have a severe fall or injury. These symptoms may represent a serious problem that is an emergency. Do not wait to see if the symptoms will go away. Get medical help right away. Call your local emergency services (911 in the [place] Do not drive yourself to the hospital. Summary A lacunar stroke (lacunar infarction) happens when an area of the brain does not get enough oxygen. This can lead to permanent brain damage. This condition is a medical emergency that must be treated right away. Treatments must be done within 3-4 hours of the start of the stroke. Controlling your risk factors for stroke is the best way to avoid another lacunar stroke. Get help right away if you have any symptoms of stroke. "BE FAST" is an easy way to remember the main warning signs of stroke. This information is not intended to replace advice given to you by your health care provider. Make sure you discuss any questions you have with your health care provider. Document Revised: 06/07/2020 Document Reviewed: 06/07/2020 Elsevier Patient Education  [place].

## 2021-08-26 NOTE — Progress Notes (Signed)
[facility]   Provider:  Dr Jaynee Eagles Referring Provider: Wenda Low, MD Primary Care Physician:  Wenda Low, MD   Virtual Visit via Video Note  I connected with Carla Perry on 08/26/21 at  1:30 PM EDT by a video enabled telemedicine application and verified that I am speaking with the correct person using two identifiers.  Location: Patient: home Provider: home   Follow Up Instructions:    I discussed the assessment and treatment plan with the patient. The patient was provided an opportunity to ask questions and all were answered. The patient agreed with the plan and demonstrated an understanding of the instructions.   The patient was advised to call back or seek an in-person evaluation if the symptoms worsen or if the condition fails to improve as anticipated.  I provided 30 minutes of non-face-to-face time during this encounter.   Melvenia Beam, MD    CC:  Chronic migraines and episodes of body paresthesias  Interval history August 26, 2021: Emgality is working great for her migraines, I gave her samples, we will leave her a few more samples until we can prescribe it for her, patient has chronic migraines, they are unilateral, pulsating pounding throbbing, photophobia phonophobia, nausea, was having more than 15 migraine days a month, that has been reduced to only several a month exceptional response.  We also discussed MRI of the brain which showed a lacunar infarct remote but unclear how long, we discussed strokes, she states her hemoglobin A1c is controlled, will check her cholesterol panel, she will continue to take aspirin, discussed stroke risks.  EEG was negative.  Mildly abnormal MRI scan of the brain with and without contrast showing a moderate right basal ganglia infarct as well as mild changes of chronic small vessel disease.  Personally reviewed images and agree with impressions.  May 22, 2021 she hadn't smoked marijuana in 30  years and went to [place] and tried smoking, she took 5 puffs, she lost consciousness, she felt terrible, she has seizure-like activity, she had CPR, she couldn't move to drink water, she doesn't remember it, she had her arms flexed, no urination or tongue biting, was 25 minutes, she stopped breathing, they gave her mouth to mouth and cpr.   Patient complains of symptoms per HPI as well as the following symptoms: seizure . Pertinent negatives and positives per HPI. All others negative   Interval history 06/25/2017: She has constant pressure on the left side of the head, she has 6/10 daily headache, migraines are on the left side, can last days with light and sound sensitivity, nausea, no vomiting, pounding pain severe, no aura, no medication overuse. She takes 2 tramadol at the onset of migraine. Dr. Erling Cruz had put her on Verapamil and Effexor. She does feel that the Verapamil helps, if she doesn't take it she get a migraine.    Interval history 06/25/2016: She gets 3-4 migraines a month, they last an hour and go away with the tramadol. She still have low back pain and radiation into the legs. She has numbness and weakness in the legs intermittently but MRI of the neural axis was negative. Kneeling thing we didn't do was imaging MRI of the lumbar spine. Considering that she continues to have numbness and weakness in the legs will image MRI of the lumbar spine.   Interval update 03/18/2016: This is a lovely 65 year old female with a very complicated past medical history including hypertension, thyroid disorder, migraine, multiple myeloma s/p chemo currently participating in  a research trial, neuropathy, insomnia, bone pain, back pain, joint pain, anemia, fatigue, leukopenia due to antineoplastic chemotherapy, right leg swelling, DVT, diabetes. I have seen patient in the past for chronic migraines but today she is here for a new problem, paresthesias that started February. Happens when waking up in the morning.  Never happens otherwise. She feels an electrical current through her body with persistent tingling.  Starts in the thoracic or in the lumbar spine. Feels like electricity. Starts in the spine. She feels tingling all over, in the face, arms, legs from head to toe.  Will last several hours. A lot of times it is associated with a migraine. Nothing makes it better. Stretching aggravates the symptoms. She has pain in the spine associated with the paresthesias. Last night it started with numbness at 2am. She couldn't get back to sleep. She developed a headache. He still has residual headache and Her left eye still fells sore and head feels sore. Also a residual burning feeling. Arms feel like they are burning. Episodes becoming more frequent. 6 episodes in Feb, 8 in march, 13 so far in April. Becoming more frequent and lasting longer. She experiences facial numbness with the episodes, burning and tingling in the spine that spreads to her whole body. The symptoms are usually associated with headache or migraine. Moving around helps with the symptoms. No weakness. No vision changes but does describe eye pain. The headaches are pressure and pain.she still gets the vertigo with the migraines. Headache left side of the head with the vertigo, no light or sound sensitivity. 5-10 minutes after the paresthesias she has the migraines/headaches. No inciting events. No trauma. She has low back pain, situated on the right side, radiates down the legs to the back of the legs, walking is not as good. Right leg goes numb. Paresthesias are more on the left.    Recent labs include normal B12. Normal TSH.   Interval update 06/19/2015: This is a former patient of Dr. Janann Colonel who is seen in our office by our nurse practitioner [name]. Patient is establishing care with me today. She is a 65 year old female with a history of chronic migraines. She was last seen in April. Today she returns and she is doing very well. She is on  verapamil 120 mg extended release and venlafaxine XR 75 mg. She is doing very well. She has had maybe 3 migraines within the last few months. And they have been on the left side of the head with vertigo twice. When she has the vertigo, she has to lay in bed and sleep it off. But this is a significant improvement and she is very happy with this maintenance. She endorses nausea, photophobia and phonophobia. She takes tramadol for acute management. Discussed in detail that if she is doing well, we will keep her on her current migraine management. She can continue to follow up with [name] as needed.     HISTORY OF PRESENT ILLNESS: Carla Perry is a 65 year old female with a history of chronic migraines. She returns today for follow-up. The patient is currently taking Effexor 37.5 mg daily as well as verapamil 120 mg daily. She reports that at the last visit Dr. Janann Colonel decreased her Effexor. She states since then she has had 13 headaches in March and she has had 4 headaches in April so far. She states her headaches are normally located on the left side and associated with left-sided numbness. She confirms nausea, photophobia and phonophobia. She also states  that when the headaches become severe its as if she is on the verge of having vertigo. She states that she can take tramadol and her headache will normally resolve in 1-2 hours. She states that if she gets a headache on the right side it usually resolves with Advil migraine. The patient does have multiple myeloma and takes a chemotherapy pill. She denies any new neurological symptoms. Denies any new medical issues. She returns today for evaluation.   HISTORY 08/02/14 Janann Colonel): Carla Perry is a 65 y.o. female, former Dr Erling Cruz patient, here as a follow up appointment for chronic migraines with last visit being on 08/09/2013 at which time she received Botox injections. She notes good benefit from these injections. Did not follow up for repeat injections. Since  last visit she was unfortunately diagnosed with multiple myeloma and has been undergoing treatment with oncology. Reports so far things are going well. She has one more cycle and then she is finished. She reports she has held off on the Botox injections per recommendation of her oncologist, instructed she can restart once she is done with chemotherapy.    Continues to have headaches. Typically will have 1 to 3 headaches per month. Takes tramadol and falls asleep, when she wakes up the headache is gone. Describes it is a predominantly right sided temporal squeezing pounding type pain. + Nausea, no emesis, + photo and phonophobia. Headaches last 6 hours to all day. Notes she gets weak on the left side with the headaches and has baseline numbness on the left side of her body. Reports she has not had a MRI brain in years. She notes Dr Erling Cruz started her on Effexor and Verapamil. She wishes to get off both of these medications as she feels they are not helping.    Recently saw eye doctor for right eye pain, told she may have glaucoma in her right eye, they will continue to monitor. They also told her that she has dry eyes, was given lubricating eye drops. The eye drops have improved the eye pain.    Initial visit 05/2013: Former Dr Erling Cruz patient. She reports being overall stable. Continues to have daily migrainous headache which have been ongoing for greater then 4 years. Predominantly R sided temporal, described as squeezing and pounding. These headaches are her baseline headache and are occuring on a near continuous pattern every day of the month. Is also having severe "flare ups" where the pain worsens and becomes a more sharp/intense pain. These episodes are occuring around 8 to 10 times per month. With these headaches she continues to have episodes of transient unilateral (predominantly R sided) weakness that self resolves. Also notes occasional unilateral sensory changes (again R sided predominant). Also has  episodes of transient vertigo associated with the headache, has occurred 2 times in the past month, requiring her to miss work. She has had multiple MRIs during these events to rule out stroke and no ischemic changes have been found. Has been unable to tolerate most abortive agents and due to presence of hemiplegic/basilar symptoms she is not a candidate for triptans or DHE. In regards to prophylactic agents she has tried for >3-4 months the following agents and failed/did not tolerate: Topamax (AED), Propranolol (BB), verapamil (CCB) and Effexor (SNRI). Remains on verapmil and Effexor per Dr Erling Cruz but no benefit noted. Denies any family history of hemiplegic migraine.    Continues to have subjective memory difficulties, notes a "fogginess". Has had formal neuro-psych testing which showed an improvement in  her overall cognitive function. Continues to have episodes of auditory hallucinations. Has not seen a psychiatrist for this.       Social History   Socioeconomic History   Marital status: Married    Spouse name: Arnell Sieving   Number of children: 2   Years of education: 14   Highest education level: Not on file  Occupational History    Employer: [employer]  Tobacco Use   Smoking status: Never   Smokeless tobacco: Never  Vaping Use   Vaping Use: Never used  Substance and Sexual Activity   Alcohol use: No    Alcohol/week: 0.0 standard drinks   Drug use: No   Sexual activity: Not on file  Other Topics Concern   Not on file  Social History Narrative   Patient lives at home with her husband Special educational needs teacher). Patient has two years college.   Right handed.   Caffeine- None   Social Determinants of Health   Financial Resource Strain: Not on file  Food Insecurity: Not on file  Transportation Needs: Not on file  Physical Activity: Not on file  Stress: Not on file  Social Connections: Not on file  Intimate Partner Violence: Not on file    Family History  Problem Relation Age of Onset   Throat  cancer Father    Stroke Father    Cancer Brother        prostate   Breast cancer Neg Hx     Past Medical History:  Diagnosis Date   Abnormal thyroid function test 08/30/2014   Anemia    Anemia, unspecified 10/06/2013   Bone pain 10/21/2013   Bronchitis 01/31/2015   Diabetes mellitus without complication ([place]) 55/9741   Steroid induced diabetes. has not picked oral med up from pharmacy as of 09-14-14   Diverticulitis 07/18/2014   DVT (deep venous thrombosis) ([place]) 02/07/2014   HBP (high blood pressure)    Insomnia 02/11/2016   Leukopenia due to antineoplastic chemotherapy ([place]) 12/27/2013   Memory loss    MGUS (monoclonal gammopathy of unknown significance)    MGUS (monoclonal gammopathy of unknown significance) 10/06/2013   Migraine    Multiple myeloma, without mention of having achieved remission 11/16/2013   Pancytopenia ([place]) 12/27/2015   Peripheral neuropathy 12/27/2015   Personal history of chemotherapy    Right leg swelling 02/07/2014   Seizure ([place]) 1960   single seizure episode at age 88   Thyroid disorder    Vitamin D deficiency 10/24/2014    Past Surgical History:  Procedure Laterality Date   HEMORRHOID SURGERY     PORT-A-CATH REMOVAL  10-2014   PORTACATH PLACEMENT Right jan 2015    Current Outpatient Medications  Medication Sig Dispense Refill   Galcanezumab-gnlm (EMGALITY) 120 MG/ML SOAJ Inject 120 mg into the skin every 30 (thirty) days. 1.12 mL 11   aspirin 325 MG tablet Take 325 mg by mouth daily.      Cholecalciferol (VITAMIN D3) 2000 UNITS TABS Take 2,000 Units by mouth daily.     dorzolamide-timolol (COSOPT) 22.3-6.8 MG/ML ophthalmic solution Place 1 drop into both eyes 2 (two) times daily.     Galcanezumab-gnlm (EMGALITY) 120 MG/ML SOAJ Inject 120 mg into the skin every 30 (thirty) days. 6 mL 11   hydrochlorothiazide (HYDRODIURIL) 25 MG tablet Take 25 mg by mouth every evening.      lenalidomide (REVLIMID) 5 MG capsule Take 1 capsule daily every other day on  days 1-21. Repeat every 28 days. 21 capsule 0   levothyroxine (  SYNTHROID, LEVOTHROID) 75 MCG tablet Take 75 mcg by mouth daily before breakfast.     methocarbamol (ROBAXIN) 500 MG tablet Take 1 tablet (500 mg total) by mouth every 8 (eight) hours as needed for muscle spasms. 90 tablet 1   Multiple Vitamins-Minerals (CENTRUM SILVER PO) Take 1 tablet by mouth daily.      polyethylene glycol (MIRALAX / GLYCOLAX) packet Take 17 g by mouth daily as needed for mild constipation.      RESTASIS 0.05 % ophthalmic emulsion PLACE 1 DROP IN BOTH EYES IN THE EVENING  4   rosuvastatin (CRESTOR) 5 MG tablet Take 5 mg by mouth daily at 2 PM.     sitaGLIPtin (JANUVIA) 50 MG tablet Take 50 mg by mouth daily.     traZODone (DESYREL) 50 MG tablet TAKE 1 TABLET (50 MG TOTAL) BY MOUTH AT BEDTIME. 90 tablet 3   venlafaxine XR (EFFEXOR-XR) 150 MG 24 hr capsule Take 1 capsule (150 mg total) by mouth daily with breakfast. 90 capsule 3   verapamil (VERELAN PM) 120 MG 24 hr capsule Take 1 capsule (120 mg total) by mouth daily. 90 capsule 3   No current facility-administered medications for this visit.   Facility-Administered Medications Ordered in Other Visits  Medication Dose Route Frequency Provider Last Rate Last Admin   gadopentetate dimeglumine (MAGNEVIST) injection 15 mL  15 mL Intravenous Once PRN Melvenia Beam, MD        Allergies as of 08/26/2021 - Review Complete 07/23/2021  Allergen Reaction Noted   Metformin Diarrhea 04/22/2021    Social History   Socioeconomic History   Marital status: Married    Spouse name: Arnell Sieving   Number of children: 2   Years of education: 14   Highest education level: Not on file  Occupational History    Employer: [employer]  Tobacco Use   Smoking status: Never   Smokeless tobacco: Never  Vaping Use   Vaping Use: Never used  Substance and Sexual Activity   Alcohol use: No    Alcohol/week: 0.0 standard drinks   Drug use: No   Sexual activity: Not on file  Other  Topics Concern   Not on file  Social History Narrative   Patient lives at home with her husband Special educational needs teacher). Patient has two years college.   Right handed.   Caffeine- None   Social Determinants of Health   Financial Resource Strain: Not on file  Food Insecurity: Not on file  Transportation Needs: Not on file  Physical Activity: Not on file  Stress: Not on file  Social Connections: Not on file  Intimate Partner Violence: Not on file    Family History  Problem Relation Age of Onset   Throat cancer Father    Stroke Father    Cancer Brother        prostate   Breast cancer Neg Hx     Past Medical History:  Diagnosis Date   Abnormal thyroid function test 08/30/2014   Anemia    Anemia, unspecified 10/06/2013   Bone pain 10/21/2013   Bronchitis 01/31/2015   Diabetes mellitus without complication ([place]) 99/3570   Steroid induced diabetes. has not picked oral med up from pharmacy as of 09-14-14   Diverticulitis 07/18/2014   DVT (deep venous thrombosis) ([place]) 02/07/2014   HBP (high blood pressure)    Insomnia 02/11/2016   Leukopenia due to antineoplastic chemotherapy ([place]) 12/27/2013   Memory loss    MGUS (monoclonal gammopathy of unknown significance)  MGUS (monoclonal gammopathy of unknown significance) 10/06/2013   Migraine    Multiple myeloma, without mention of having achieved remission 11/16/2013   Pancytopenia ([place]) 12/27/2015   Peripheral neuropathy 12/27/2015   Personal history of chemotherapy    Right leg swelling 02/07/2014   Seizure ([place]) 1960   single seizure episode at age 25   Thyroid disorder    Vitamin D deficiency 10/24/2014    Patient Active Problem List   Diagnosis Date Noted   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   Muscle spasm 04/16/2021   Abdominal wall pain 01/22/2021   Other constipation 10/30/2020   Preventive measure 08/07/2020   Essential hypertension 07/06/2018   Atypical chest pain 06/29/2018   B12 deficiency 03/30/2018   Benign skin lesion of cheek  01/06/2018   Elevated liver enzymes 10/05/2017   Rash in adult 08/04/2017   Chemotherapy induced neutropenia ([place]) 12/25/2016   Lung nodule seen on imaging study 12/25/2016   Granuloma, pyogenic 09/03/2016   Lumbar pain 08/27/2016   Paresthesias 03/19/2016   Headache 03/19/2016   Insomnia 02/11/2016   Peripheral neuropathy 12/27/2015   Pancytopenia ([place]) 12/27/2015   Disability examination 03/22/2015   Chronic migraine without aura without status migrainosus, not intractable 02/16/2015   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/2015   Influenza B 01/31/2015   Malignant bone pain 12/26/2014   Vitamin D deficiency 10/24/2014   Diabetes mellitus without complication ([place]) 48/2500   Abnormal thyroid function test 08/30/2014   Fatigue 08/23/2014   Generalized postprandial abdominal pain 08/22/2014   Hypokalemia 07/25/2014   Diverticulitis 07/18/2014   Hematochezia due to medication 05/23/2014   Neck pain 05/02/2014   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   Drug-induced pancytopenia ([place]) 05/02/2014   Right leg swelling 02/07/2014   DVT (deep venous thrombosis) ([place]) 02/07/2014   Multiple myeloma in remission ([place]) 11/16/2013   Bone pain 10/21/2013   Anemia in neoplastic disease 10/06/2013   Intractable chronic migraine without aura 06/28/2013    Past Surgical History:  Procedure Laterality Date   HEMORRHOID SURGERY     PORT-A-CATH REMOVAL  10-2014   PORTACATH PLACEMENT Right jan 2015    Current Outpatient Medications  Medication Sig Dispense Refill   Galcanezumab-gnlm (EMGALITY) 120 MG/ML SOAJ Inject 120 mg into the skin every 30 (thirty) days. 1.12 mL 11   aspirin 325 MG tablet Take 325 mg by mouth daily.      Cholecalciferol (VITAMIN D3) 2000 UNITS TABS Take 2,000 Units by mouth daily.     dorzolamide-timolol (COSOPT) 22.3-6.8 MG/ML ophthalmic solution Place 1 drop into both eyes 2 (two) times daily.     Galcanezumab-gnlm (EMGALITY) 120 MG/ML SOAJ Inject  120 mg into the skin every 30 (thirty) days. 6 mL 11   hydrochlorothiazide (HYDRODIURIL) 25 MG tablet Take 25 mg by mouth every evening.      lenalidomide (REVLIMID) 5 MG capsule Take 1 capsule daily every other day on days 1-21. Repeat every 28 days. 21 capsule 0   levothyroxine (SYNTHROID, LEVOTHROID) 75 MCG tablet Take 75 mcg by mouth daily before breakfast.     methocarbamol (ROBAXIN) 500 MG tablet Take 1 tablet (500 mg total) by mouth every 8 (eight) hours as needed for muscle spasms. 90 tablet 1   Multiple Vitamins-Minerals (CENTRUM SILVER PO) Take 1 tablet by mouth daily.      polyethylene glycol (MIRALAX / GLYCOLAX) packet Take 17 g by mouth daily as needed for mild constipation.      RESTASIS 0.05 % ophthalmic  emulsion PLACE 1 DROP IN BOTH EYES IN THE EVENING  4   rosuvastatin (CRESTOR) 5 MG tablet Take 5 mg by mouth daily at 2 PM.     sitaGLIPtin (JANUVIA) 50 MG tablet Take 50 mg by mouth daily.     traZODone (DESYREL) 50 MG tablet TAKE 1 TABLET (50 MG TOTAL) BY MOUTH AT BEDTIME. 90 tablet 3   venlafaxine XR (EFFEXOR-XR) 150 MG 24 hr capsule Take 1 capsule (150 mg total) by mouth daily with breakfast. 90 capsule 3   verapamil (VERELAN PM) 120 MG 24 hr capsule Take 1 capsule (120 mg total) by mouth daily. 90 capsule 3   No current facility-administered medications for this visit.   Facility-Administered Medications Ordered in Other Visits  Medication Dose Route Frequency Provider Last Rate Last Admin   gadopentetate dimeglumine (MAGNEVIST) injection 15 mL  15 mL Intravenous Once PRN Melvenia Beam, MD        Allergies as of 08/26/2021 - Review Complete 07/23/2021  Allergen Reaction Noted   Metformin Diarrhea 04/22/2021    Vitals: There were no vitals taken for this visit. Last Weight:  Wt Readings from Last 1 Encounters:  07/23/21 164 lb 9.6 oz (74.7 kg)   Last Height:   Ht Readings from Last 1 Encounters:  07/23/21 5' 7"  (1.702 m)     Physical exam: Exam: Gen: NAD,  conversant      CV: attempted, Could not perform over Web Video. Denies palpitations or chest pain or SOB. VS: Breathing at a normal rate. Weight appears within normal limits. Not febrile. Eyes: Conjunctivae clear without exudates or hemorrhage  Neuro: Detailed Neurologic Exam  Speech:    Speech is normal; fluent and spontaneous with normal comprehension.  Cognition:    The patient is oriented to person, place, and time;     recent and remote memory intact;     language fluent;     normal attention, concentration,     fund of knowledge Cranial Nerves:    The pupils are equal, round, and reactive to light. Attempted, Cannot perform fundoscopic exam. Visual fields are full to finger confrontation. Extraocular movements are intact.  The face is symmetric with normal sensation. The palate elevates in the midline. Hearing intact. Voice is normal. Shoulder shrug is normal. The tongue has normal motion without fasciculations.   Coordination:    Normal finger to nose  Gait:    Normal native gait  Motor Observation:   no involuntary movements noted. Tone:    Appears normal  Posture:    Posture is normal. normal erect    Strength:    Strength is anti-gravity and symmetric in the upper and lower limbs.      Sensation: intact to LT   Assessment/Plan:  This is a lovely 65 year old female with a very complicated past medical history including hypertension, thyroid disorder, migraine, multiple myeloma s/p chemo currently participating in a research trial, neuropathy, insomnia, bone pain, back pain, joint pain, anemia, fatigue, leukopenia due to antineoplastic chemotherapy, right leg swelling, DVT, diabetes. MRI imaging of the brain, cervical spinal and thoracic spine were negative in the past. We have seen her for numbness from the abdomen down the legs,weakness in the legs, ow back pain and radiation, migraines. She is her for a provoked seizure..   -Continue Emgality for migraines working  great (she could not get aimovig approved in the past but she has failed multiple preventatives she will return in 3-4 months to see me, gave her  sample again and will prescribe) - She had a provoked seizure due to [facility] overdose but will get an MRI and EEG. Since provoked, do not believe she has to stop driving. She does partake in [facility] or other dgs, this was a one time visit to [place] and will never do it again. - EEG neg -MRI showed a lacunar stroke  I had a long d/w patient about her recent stroke, risk for recurrent stroke/TIAs, personally independently reviewed imaging studies and stroke evaluation results and answered questions.Continue ASA for secondary stroke prevention and maintain strict control of hypertension with blood pressure goal below 130/90, diabetes with hemoglobin A1c goal below 6.5% and lipids with LDL cholesterol goal below 70 mg/dL.. I also advised the patient to eat a healthy diet with plenty of whole grains, lean meats, fruits and vegetables, exercise regularly and maintain ideal body weight    Meds ordered this encounter  Medications   Galcanezumab-gnlm (EMGALITY) 120 MG/ML SOAJ    Sig: Inject 120 mg into the skin every 30 (thirty) days.    Dispense:  1.12 mL    Refill:  11    Orders Placed This Encounter  Procedures   Lipid Panel       Sarina Ill, MD  [facility] [address] [place], [place] [address]  Phone [phone] Fax [phone]  I spent 30 minutes of face-to-face and non-face-to-face time with patient on the  1. Chronic migraine without aura without status migrainosus, not intractable   2. 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  This included previsit chart review, lab review, study review, order entry, electronic health record documentation, patient education on the different diagnostic and therapeutic options, counseling and coordination of care, risks and benefits of management, compliance, or risk factor  reduction

## 2021-08-27 ENCOUNTER — Telehealth: Payer: Self-pay | Admitting: *Deleted

## 2021-08-27 MED ORDER — EMGALITY 120 MG/ML ~~LOC~~ SOAJ: SUBCUTANEOUS | 0 refills | Status: DC

## 2021-08-27 NOTE — Telephone Encounter (Signed)
Approved  Request Reference Number: [id number]. EMGALITY INJ 120MG /ML is approved through 11/30/2021.

## 2021-08-27 NOTE — Telephone Encounter (Signed)
SAMPLE: Emgality marked for pick up in fridge for pt to p/u Wednesday.

## 2021-08-27 NOTE — Telephone Encounter (Signed)
-----   Message from Melvenia Beam, MD sent at 08/26/2021  6:56 PM EDT ----- Regarding: Emgality Patient is doing extremely well on Emgality, I am going to prescribe it for her, I like to leave her a box of samples she will come pick them up on Wednesday and get some blood work done here in the office, please let the front desk know and leave an Emgality box with her name in the fridge, she will also come in and get a lipid panel.  Thank you.

## 2021-08-28 ENCOUNTER — Other Ambulatory Visit (INDEPENDENT_AMBULATORY_CARE_PROVIDER_SITE_OTHER): Payer: Self-pay

## 2021-08-28 DIAGNOSIS — I6381 Other cerebral infarction due to occlusion or stenosis of small artery: Secondary | ICD-10-CM

## 2021-08-28 DIAGNOSIS — Z0289 Encounter for other administrative examinations: Secondary | ICD-10-CM

## 2021-08-29 LAB — LIPID PANEL
Chol/HDL Ratio: 2 ratio (ref 0.0–4.4)
Cholesterol, Total: 112 mg/dL (ref 100–199)
HDL: 56 mg/dL (ref >39)
LDL Chol Calc (NIH): 36 mg/dL (ref 0–99)
Triglycerides: 109 mg/dL (ref 0–149)
VLDL Cholesterol Cal: 20 mg/dL (ref 5–40)

## 2021-09-08 ENCOUNTER — Other Ambulatory Visit: Payer: Self-pay | Admitting: Hematology and Oncology

## 2021-09-08 DIAGNOSIS — C9001 Multiple myeloma in remission: Secondary | ICD-10-CM

## 2021-09-09 ENCOUNTER — Encounter: Payer: Self-pay | Admitting: Hematology and Oncology

## 2021-09-10 ENCOUNTER — Encounter: Payer: Self-pay | Admitting: Hematology and Oncology

## 2021-09-17 ENCOUNTER — Inpatient Hospital Stay: Payer: Medicare Other | Attending: Hematology and Oncology

## 2021-09-17 ENCOUNTER — Other Ambulatory Visit: Payer: Self-pay

## 2021-09-17 ENCOUNTER — Encounter: Payer: Self-pay | Admitting: *Deleted

## 2021-09-17 ENCOUNTER — Encounter: Payer: Self-pay | Admitting: Hematology and Oncology

## 2021-09-17 DIAGNOSIS — Z006 Encounter for examination for normal comparison and control in clinical research program: Secondary | ICD-10-CM | POA: Insufficient documentation

## 2021-09-17 DIAGNOSIS — C9001 Multiple myeloma in remission: Secondary | ICD-10-CM | POA: Diagnosis present

## 2021-09-17 LAB — CBC WITH DIFFERENTIAL (CANCER CENTER ONLY)
Abs Immature Granulocytes: 0.02 10*3/uL (ref 0.00–0.07)
Basophils Absolute: 0 10*3/uL (ref 0.0–0.1)
Basophils Relative: 1 %
Eosinophils Absolute: 0.1 10*3/uL (ref 0.0–0.5)
Eosinophils Relative: 3 %
HCT: 36.1 % (ref 36.0–46.0)
Hemoglobin: 11.5 g/dL — ABNORMAL LOW (ref 12.0–15.0)
Immature Granulocytes: 1 %
Lymphocytes Relative: 53 %
Lymphs Abs: 2.1 10*3/uL (ref 0.7–4.0)
MCH: 23 pg — ABNORMAL LOW (ref 26.0–34.0)
MCHC: 31.9 g/dL (ref 30.0–36.0)
MCV: 72.2 fL — ABNORMAL LOW (ref 80.0–100.0)
Monocytes Absolute: 0.5 10*3/uL (ref 0.1–1.0)
Monocytes Relative: 12 %
Neutro Abs: 1.2 10*3/uL — ABNORMAL LOW (ref 1.7–7.7)
Neutrophils Relative %: 30 %
Platelet Count: 195 10*3/uL (ref 150–400)
RBC: 5 MIL/uL (ref 3.87–5.11)
RDW: 14.8 % (ref 11.5–15.5)
WBC Count: 3.9 10*3/uL — ABNORMAL LOW (ref 4.0–10.5)
nRBC: 0 % (ref 0.0–0.2)

## 2021-09-17 MED ORDER — LENALIDOMIDE 5 MG PO CAPS: ORAL_CAPSULE | ORAL | 0 refills | Status: DC

## 2021-09-17 NOTE — Research (Signed)
Research - [facility] ECOG-ACRIN [id number] Maintenance Cycle 87, Day 1  Patient into clinic unaccompanied today for evaluation prior to beginning treatment Cycle 87 of revlimid.   Labs: CBC drawn per protocol.   Adverse Events Since Last Visit (in the past month): Patient reports ongoing fatigue (grade 2) not limiting self-care.  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 Daily moderate back ache (grade 2) is ongoing. She had insomnia (grade 2) in the past week and ttook trazadone 3 times. Patient reports migraines (grade 2) this past week which she took tramadol twice. She reports left sided migraine pain in her head along with left sided numbness and weakness in her left arm and leg X 2 episodes this past week which both lasted almost all day. She denies any difficulty with speech, any facial drooping or any difficulty walking. She states symptoms come on slowly and always coincide with migraine on left side of her head. 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 Patient continues to exercise regularly swimming laps in the pool 3 times weekly on average.  See AE table below.  Maintenance therapy with Revlimid: Patient returned completed cycle 86 Medication Calendar, confirming dosing at dose level -3, Revlimid 5mg  every other day, Days 1-21. Patient notes that no doses were missed. Patient given printed appointment calendar with Cycle 87 treatment days marked, to document doses for the next treatment cycle, scheduled to begin today pending lab results.  Plan: Reminded patient that Revlimid can increase risk of stroke. Instructed patient to notify her neurologist of the left sided numbness/weakness she experienced this past week. Also instructed patient if she ever experiences difficulty walking, moving her arm, difficulty with speech, facial drooping or increase in weakness symptoms she  should call 911 to go to ED immediately. Patient verbalized understanding. This research nurse will call patient after review of today's lab results and after speaking with Dr. Alvy Bimler to notify if okay to start cycle 87 of Revlimid today as planned. Next appointment scheduled on 10/08/21 for lab work one week prior to seeing Dr. Alvy Bimler on 10/15/21.   Patient verbalized understanding and in agreement with this plan. Reminded patient to call our office if any new questions/concerns prior to next visit. She verbalized understanding.   Adverse Event Log Study/Protocol: [facility] ECOG E1A11 Maintenance Cycle 86 08/20/2021-09/17/2021 (end of Cycle 86 = 93/57/0177) Solicited &/or Reportable Events Grade Cycle # Comments  Anemia Grade 1 86    Hyperglycemia Grade 0    Lymphocyte count decreased Grade 0    Neutrophil count decreased Grade 2  86   Platelet count decreased Grade 0     Diarrhea Grade 0    Dyspnea Grade 0    Edema: limbs Grade 0    Fatigue  Grade 2  86 Moderate, occasionally limiting ADLs (unchanged from previous)  Fever Grade 0    Insomnia  Grade 2   86 Trazodone PRN  Irritability Grade 0    Muscle weakness trunk Grade 0    Nausea Grade 0    Peripheral motor neuropathy Grade 0    Peripheral sensory neuropathy  Grade 2 86 Moderate symptoms, unchanged from previous  Rash acneiform Grade 0    Vomiting Grade 0     Non-reportable AEs (unsolicited, < Grade 3): Leukopenia (grade 1) Moderate pain (grade 2) Back pain 3.   Migraines (left sided headaches associated with left sided  weakness arm and leg) (grade 2)  10:30 am Dr. Alvy Bimler notified of patient's report of migraines with left sided weakness. She instructs okay to start next cycle of Revlimid today as scheduled but patient must hold Revlimid if symptoms return and to notify her neurologist when it happens. Instructed patient okay to start Revlimid today and to hold it if she develops any numbness or weakness on one side of her body.  Instructed to contact her neurologist if this happens again and then to notify us. Patient verbalized understanding.  Carla Perry, BSN, RN, [employer] Clinical Research Nurse 09/17/2021

## 2021-10-03 ENCOUNTER — Other Ambulatory Visit: Payer: Self-pay | Admitting: Hematology and Oncology

## 2021-10-03 DIAGNOSIS — C9001 Multiple myeloma in remission: Secondary | ICD-10-CM

## 2021-10-03 NOTE — Telephone Encounter (Signed)
Pls refill electronically °

## 2021-10-04 ENCOUNTER — Encounter: Payer: Self-pay | Admitting: Hematology and Oncology

## 2021-10-07 ENCOUNTER — Telehealth: Payer: Self-pay | Admitting: *Deleted

## 2021-10-07 NOTE — Telephone Encounter (Signed)
[id number] Study: Patient confirmed her lab appointment for tomorrow morning and understands does not need to see research nurse tomorrow morning. This research nurse will call if her lab needs to be repeated next week prior to seeing Dr. Alvy Bimler. Otherwise, this nurse will talk to her again next week at her MD appointment. Patient verbalized understanding.  Foye Spurling, BSN, RN, [employer] Clinical Research Nurse 10/07/2021 11:17 AM

## 2021-10-08 ENCOUNTER — Inpatient Hospital Stay: Payer: Medicare Other | Attending: Hematology and Oncology

## 2021-10-08 ENCOUNTER — Other Ambulatory Visit: Payer: Self-pay

## 2021-10-08 DIAGNOSIS — Z006 Encounter for examination for normal comparison and control in clinical research program: Secondary | ICD-10-CM | POA: Insufficient documentation

## 2021-10-08 DIAGNOSIS — C9001 Multiple myeloma in remission: Secondary | ICD-10-CM | POA: Diagnosis not present

## 2021-10-08 DIAGNOSIS — Z79899 Other long term (current) drug therapy: Secondary | ICD-10-CM | POA: Diagnosis not present

## 2021-10-08 DIAGNOSIS — D61810 Antineoplastic chemotherapy induced pancytopenia: Secondary | ICD-10-CM | POA: Insufficient documentation

## 2021-10-08 DIAGNOSIS — M545 Low back pain, unspecified: Secondary | ICD-10-CM | POA: Insufficient documentation

## 2021-10-08 LAB — CBC WITH DIFFERENTIAL (CANCER CENTER ONLY)
Abs Immature Granulocytes: 0 10*3/uL (ref 0.00–0.07)
Basophils Absolute: 0 10*3/uL (ref 0.0–0.1)
Basophils Relative: 1 %
Eosinophils Absolute: 0.1 10*3/uL (ref 0.0–0.5)
Eosinophils Relative: 3 %
HCT: 36.7 % (ref 36.0–46.0)
Hemoglobin: 11.5 g/dL — ABNORMAL LOW (ref 12.0–15.0)
Immature Granulocytes: 0 %
Lymphocytes Relative: 48 %
Lymphs Abs: 1.5 10*3/uL (ref 0.7–4.0)
MCH: 23 pg — ABNORMAL LOW (ref 26.0–34.0)
MCHC: 31.3 g/dL (ref 30.0–36.0)
MCV: 73.4 fL — ABNORMAL LOW (ref 80.0–100.0)
Monocytes Absolute: 0.4 10*3/uL (ref 0.1–1.0)
Monocytes Relative: 14 %
Neutro Abs: 1 10*3/uL — ABNORMAL LOW (ref 1.7–7.7)
Neutrophils Relative %: 34 %
Platelet Count: 206 10*3/uL (ref 150–400)
RBC: 5 MIL/uL (ref 3.87–5.11)
RDW: 15.1 % (ref 11.5–15.5)
WBC Count: 3 10*3/uL — ABNORMAL LOW (ref 4.0–10.5)
nRBC: 0 % (ref 0.0–0.2)

## 2021-10-08 LAB — CMP (CANCER CENTER ONLY)
ALT: 28 U/L (ref 0–44)
AST: 23 U/L (ref 15–41)
Albumin: 3.7 g/dL (ref 3.5–5.0)
Alkaline Phosphatase: 54 U/L (ref 38–126)
Anion gap: 8 (ref 5–15)
BUN: 11 mg/dL (ref 8–23)
CO2: 26 mmol/L (ref 22–32)
Calcium: 8.9 mg/dL (ref 8.9–10.3)
Chloride: 107 mmol/L (ref 98–111)
Creatinine: 0.84 mg/dL (ref 0.44–1.00)
GFR, Estimated: >60 mL/min (ref >60)
Glucose, Bld: 90 mg/dL (ref 70–99)
Potassium: 3.8 mmol/L (ref 3.5–5.1)
Sodium: 141 mmol/L (ref 135–145)
Total Bilirubin: 0.8 mg/dL (ref 0.3–1.2)
Total Protein: 7.5 g/dL (ref 6.5–8.1)

## 2021-10-08 LAB — TSH: TSH: 1.346 u[IU]/mL (ref 0.308–3.960)

## 2021-10-08 LAB — LACTATE DEHYDROGENASE: LDH: 260 U/L — ABNORMAL HIGH (ref 98–192)

## 2021-10-09 LAB — KAPPA/LAMBDA LIGHT CHAINS
Kappa free light chain: 31.6 mg/L — ABNORMAL HIGH (ref 3.3–19.4)
Kappa, lambda light chain ratio: 1.69 — ABNORMAL HIGH (ref 0.26–1.65)
Lambda free light chains: 18.7 mg/L (ref 5.7–26.3)

## 2021-10-14 LAB — MULTIPLE MYELOMA PANEL, SERUM
Albumin SerPl Elph-Mcnc: 3.8 g/dL (ref 2.9–4.4)
Albumin/Glob SerPl: 1.1 (ref 0.7–1.7)
Alpha 1: 0.2 g/dL (ref 0.0–0.4)
Alpha2 Glob SerPl Elph-Mcnc: 0.6 g/dL (ref 0.4–1.0)
B-Globulin SerPl Elph-Mcnc: 1.1 g/dL (ref 0.7–1.3)
Gamma Glob SerPl Elph-Mcnc: 1.7 g/dL (ref 0.4–1.8)
Globulin, Total: 3.6 g/dL (ref 2.2–3.9)
IgA: 132 mg/dL (ref 87–352)
IgG (Immunoglobin G), Serum: 1982 mg/dL — ABNORMAL HIGH (ref 586–1602)
IgM (Immunoglobulin M), Srm: 60 mg/dL (ref 26–217)
Total Protein ELP: 7.4 g/dL (ref 6.0–8.5)

## 2021-10-15 ENCOUNTER — Inpatient Hospital Stay: Payer: Medicare Other | Admitting: Hematology and Oncology

## 2021-10-15 ENCOUNTER — Other Ambulatory Visit: Payer: Self-pay | Admitting: Hematology and Oncology

## 2021-10-15 ENCOUNTER — Other Ambulatory Visit: Payer: Self-pay

## 2021-10-15 ENCOUNTER — Encounter: Payer: Self-pay | Admitting: Hematology and Oncology

## 2021-10-15 DIAGNOSIS — D61810 Antineoplastic chemotherapy induced pancytopenia: Secondary | ICD-10-CM | POA: Diagnosis not present

## 2021-10-15 DIAGNOSIS — C9001 Multiple myeloma in remission: Secondary | ICD-10-CM

## 2021-10-15 DIAGNOSIS — D61818 Other pancytopenia: Secondary | ICD-10-CM

## 2021-10-15 DIAGNOSIS — M545 Low back pain, unspecified: Secondary | ICD-10-CM | POA: Diagnosis not present

## 2021-10-15 DIAGNOSIS — Z006 Encounter for examination for normal comparison and control in clinical research program: Secondary | ICD-10-CM | POA: Diagnosis not present

## 2021-10-15 DIAGNOSIS — Z79899 Other long term (current) drug therapy: Secondary | ICD-10-CM | POA: Diagnosis not present

## 2021-10-15 MED ORDER — LENALIDOMIDE 5 MG PO CAPS: ORAL_CAPSULE | ORAL | 0 refills | Status: DC

## 2021-10-15 NOTE — Research (Signed)
Research - [facility] ECOG-ACRIN [id number] Maintenance Cycle 88, Day 1  Patient into clinic unaccompanied today for evaluation prior to beginning treatment Cycle 88 of revlimid.   Labs w/ myeloma panel: Obtained on 10/08/21 within 7-day window per protocol, to allow adequate time for results prior to treatment. Results reviewed by Dr. Alvy Bimler and found to be within parameters for continued dosing at Maintenance Phase Dose Level -3, Revlimid 5 mg every other day for 21 days, every 28 days. 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 Per MD, patient's calcium is stable and there is no need for supplementation. Patient reports taking a multivitamin and vitamin D3 for bone health. Per MD, elevated LDH and elevated kappa/lambda ratio are not clinically significant. Also per MD, bone marrow biopsy and aspirate are not considered standard of care at this time.   History and physical exam: See MD note dated today.     Adverse Events Since Last Visit (in the past month): Patient reports ongoing fatigue (grade 2) not limiting self-care. 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 Patient notes occasional mild bone pain (grade 1). Daily moderate back ache (grade 2) is ongoing, for which she continues to take methocarbamol occasionally as needed. Patient reports one episode of diarrhea. She has had insomnia (grade 2) and taken trazadone a few times this past month. Patient reports no neurological symptoms other than her baseline headaches and migraines, for which she continues to take tramadol occasionally as needed (one time in the past month). Patient continues to exercise regularly swimming laps in the pool 3 times weekly on average.  See AE table below for cycles 85-87.  Maintenance therapy with Revlimid: Based on lab results, AE review and history and physical by Dr. Alvy Bimler, patient meets criteria for continued treatment with Revlimid, which she is in agreement  to do. Patient is aware that treatment will continue indefinitely, unless there is evidence of disease recurrence, adverse effects requiring treatment discontinuation, or patient/physician decision to discontinue therapy. Patient returned completed cycle 87 Medication Calendar, confirming dosing at dose level -3, Revlimid 81m every other day, Days 1-21. Patient notes that no doses were missed. Patient given printed appointment calendar with Cycle 88 treatment days marked to document doses for the next treatment cycle, scheduled to begin today.  Plan: Patient will continue with monthly clinic visits with lab. These are due on 11/12/21 and 12/10/21 for hematology and AE assessments prior to beginning subsequent treatment cycles. Myeloma panel and other labs to be performed on 12/31/21 in advance of the next MD clinic visit at the end of Cycle 90, scheduled for 01/07/22. Reminded patient to call our office if any new questions/concerns prior to next visit. She verbalized understanding.   Adverse Event Log Study/Protocol: [facility] ECOG E1A11 Maintenance Cycles 85-87: 07/23/21-10/15/21 (end of Cycle 87 = 101/56/15 Solicited &/or Reportable Events Grade Attribution to lenalidomide Cycle # Comments  Anemia Grade 1 Definite 85, 86, 87    Hyperglycemia Grade 0 -    Lymphocyte count decreased Grade 0 -    Neutrophil count decreased Grade 2  Definite 85, 86, 87   Platelet count decreased Grade 0 -     Diarrhea Grade 1 Possible 87   Dyspnea Grade 0 -    Edema: limbs Grade 0 -    Fatigue  Grade 2  Probable 85, 86, 87 Moderate, occasionally limiting ADLs (unchanged from previous)  Fever Grade 0 -    Insomnia  Grade 2   Unlikely  85, 86, 87 Trazadone PRN  Irritability Grade 0 -    Muscle weakness trunk Grade 0 -    Nausea Grade 0 - -   Peripheral motor neuropathy Grade 0 -    Peripheral sensory neuropathy  Grade 2 Unlikely 85, 86, 87 Moderate symptoms, unchanged from previous  Rash acneiform Grade 0 -    Vomiting  Grade 0 -     Non-reportable AEs (unsolicited, < Grade 3): Leukopenia (grade 1) Moderate pain (grade 2) Back pain Migraine 3.   Mild pain (grade 1) Bone pain Headache   Vickii Penna, RN, BSN, CPN Clinical Research Nurse I [phone]  10/15/2021 8:55 AM

## 2021-10-15 NOTE — Assessment & Plan Note (Signed)
This is due to her treatment She is not symptomatic Observe 

## 2021-10-15 NOTE — Progress Notes (Signed)
[place] OFFICE PROGRESS NOTE  Patient Care Team: Wenda Low, MD as PCP - General (Internal Medicine) Heath Lark, MD as Consulting Physician (Hematology and Oncology)  ASSESSMENT & PLAN:  Multiple myeloma in remission [facility]) Her recent myeloma panel showed that she is in remission Her slightly elevated free light chains and LDH is unrelated The patient is comfortable to remain on Revlimid indefinitely. 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   All questions were answered. The patient knows to call the clinic with any problems, questions or concerns. The total time spent in the appointment was 20 minutes encounter with patients including review of chart and various tests results, discussions about plan of care and coordination of care plan   Heath Lark, MD 10/15/2021 9:24 AM  INTERVAL HISTORY: Please see below for problem oriented charting. she returns for treatment follow-up on single agent maintenance Revlimid for history of multiple myeloma She is doing well No recent infection She had intermittent chronic back pain, stable will improve when she swims No recent neurological deficits such as headache, migraines  REVIEW OF SYSTEMS:   Constitutional: Denies fevers, chills or abnormal weight loss Eyes: Denies blurriness of vision Ears, nose, mouth, throat, and face: Denies mucositis or sore throat Respiratory: Denies cough, dyspnea or  wheezes Cardiovascular: Denies palpitation, chest discomfort or lower extremity swelling Gastrointestinal:  Denies nausea, heartburn or change in bowel habits Skin: Denies abnormal skin rashes Lymphatics: Denies new lymphadenopathy or easy bruising Neurological:Denies numbness, tingling or new weaknesses Behavioral/Psych: Mood is stable, no new changes  All other systems were reviewed with the patient and are negative.  I have reviewed the past medical history, past surgical history, social history and family history with the patient and they are unchanged from previous note.  ALLERGIES:  is allergic to metformin.  MEDICATIONS:  Current Outpatient Medications  Medication Sig Dispense Refill   aspirin 325 MG tablet Take 325 mg by mouth daily.      Cholecalciferol (VITAMIN D3) 2000 UNITS TABS Take 2,000 Units by mouth daily.     dorzolamide-timolol (COSOPT) 22.3-6.8 MG/ML ophthalmic solution Place 1 drop into both eyes 2 (two) times daily.     Galcanezumab-gnlm (EMGALITY) 120 MG/ML SOAJ Inject 120 mg into the skin every 30 (thirty) days. 1.12 mL 11   hydrochlorothiazide (HYDRODIURIL) 25 MG tablet Take 25 mg by mouth every evening.      lenalidomide (REVLIMID) 5 MG capsule TAKE 1 CAPSULE BY MOUTH  EVERY OTHER DAY FOR 21 DAYS ON THEN 7 DAYS OFF 11 capsule 0   levothyroxine (SYNTHROID, LEVOTHROID) 75 MCG tablet Take 75 mcg by mouth daily before breakfast.     methocarbamol (ROBAXIN) 500 MG tablet Take 1 tablet (500 mg total) by mouth every 8 (eight) hours as needed for muscle spasms. 90 tablet 1   Multiple Vitamins-Minerals (CENTRUM SILVER PO) Take 1 tablet by mouth daily.      polyethylene glycol (MIRALAX / GLYCOLAX) packet Take 17 g by mouth  daily as needed for mild constipation.      RESTASIS 0.05 % ophthalmic emulsion PLACE 1 DROP IN BOTH EYES IN THE EVENING  4   rosuvastatin (CRESTOR) 5 MG tablet Take 5 mg by mouth daily at 2 PM.     sitaGLIPtin (JANUVIA) 50 MG tablet Take 50 mg by mouth  daily.     traZODone (DESYREL) 50 MG tablet TAKE 1 TABLET (50 MG TOTAL) BY MOUTH AT BEDTIME. 90 tablet 3   venlafaxine XR (EFFEXOR-XR) 150 MG 24 hr capsule Take 1 capsule (150 mg total) by mouth daily with breakfast. 90 capsule 3   verapamil (VERELAN PM) 120 MG 24 hr capsule Take 1 capsule (120 mg total) by mouth daily. 90 capsule 3   No current facility-administered medications for this visit.   Facility-Administered Medications Ordered in Other Visits  Medication Dose Route Frequency Provider Last Rate Last Admin   gadopentetate dimeglumine (MAGNEVIST) injection 15 mL  15 mL Intravenous Once PRN Melvenia Beam, MD        SUMMARY OF ONCOLOGIC HISTORY: Oncology History Overview Note  ISS stage 1 IgG lambda subtype (serum albumin 3.6, Beta2 microglobulin 2.32) Durie Salmon Stage 1   Multiple myeloma in remission ([place])  10/10/2013 Imaging   Skeletal survery was negative   11/09/2013 Bone Marrow Biopsy   BM biopsy confirmed myeloma, 76% involved, IgG lambda subtype   12/06/2013 - 08/29/2014 Chemotherapy   Sh received chemo with revlimid, Velcade, Dexamethasone and Zometa. Patient particpated in clinical research [facility] [phone]   02/23/2014 Bone Marrow Biopsy   Repeat bone marrow biopsy showed 5% involvement   03/31/2014 Adverse Reaction   Zometa was discontinued due to osteonecrosis of the jaw.   05/05/2014 Imaging   Imaging study of the neck showed no explanation that could cause right neck pain. She is noted to have incidental left upper lung nodule. Plan to repeat imaging study in 3 months.   09/06/2014 Imaging   Bone survey showed no evidence of fracture   09/14/2014 Bone Marrow Biopsy   Bone marrow biopsy showed 8% residual plasma cells by manual count but none on the biopsy specimen   09/26/2014 -  Chemotherapy   She is started on cycle 1 of maintenance Revlimid   01/31/2015 Imaging    chest x-ray showed pneumonia. Treatment was placed on hold.   05/03/2015 Bone Marrow Biopsy    Accession: [id number] repeat bone marrow aspirate and biopsy show 5% residual plasma cells   10/14/2016 Bone Marrow Biopsy   Bone marrow biopsy showed the plasma cells represent 4% of all cells with lack of large aggregates or sheets. To assess the plasma cell clonality, immunohistochemical stains is performed and it lack clonality. Normal cytogenetics and FISH   01/09/2017 Imaging   CT chest showed ground-glass 1.5 cm apical left upper lobe pulmonary nodule. Initial follow-up with CT at 6-12 months is recommended to confirm persistence. If persistent, repeat CT is recommended every 2 years until 5 years of stability has been established. This recommendation follows the consensus statement: Guidelines for Management of Incidental Pulmonary Nodules Detected on CT Images: From the Fleischner Society 2017; Radiology 2017; 284:228-243. 2. Mild patchy ground-glass opacities in the right upper lobe, probably inflammatory, which can also be reassessed on follow-up chest CT performed for the above dominant ground-glass nodule. 3. Solitary 3 mm right lower lobe solid pulmonary nodule, which can also be reassessed on follow-up chest CT performed for the above dominant ground-glass nodule. 4. No thoracic adenopathy. 5. Aortic  atherosclerosis.  Two-vessel coronary atherosclerosis.   06/30/2017 Imaging   1. No interval change in the 11 x 13 mm ground-glass nodule left upper lobe. Given the nearly 6 months of imaging stability, repeat CT is recommended every 2 years until 5 years of stability has been established. This recommendation follows the consensus statement: Guidelines for Management of Incidental Pulmonary Nodules Detected on CT Images: From the Fleischner Society 2017; Radiology 2017; 284:228-243. 2. Interval resolution of the tiny patchy ground-glass nodules right upper lobe, likely infectious/inflammatory etiology. 3. Stable 3 mm right lower lobe pulmonary nodule. Attention on follow-up recommended. 4.   Aortic Atherosclerois (ICD10-170.0)   09/29/2017 Imaging   Skeletal survey 1. Questionable new tiny lucency noted the posterior portion of C3 vertebral body. This may represent a small lytic lesion.  2. No definite thoracic lesion noted on today's exam. Stable lucencies in the left ilium and acetabulum.  3. No other focal abnormalities identified. The left hip is unremarkable .   04/16/2021 -  Chemotherapy   Patient is on Treatment Plan : MYELOMA Research [facility] E1A11 Arm D Maintenance Lenalidomide q28d       PHYSICAL EXAMINATION: ECOG PERFORMANCE STATUS: 1 - Symptomatic but completely ambulatory  Vitals:   10/15/21 0807  BP: 138/82  Pulse: 62  Resp: 18  Temp: 98 F (36.7 C)  SpO2: 100%   Filed Weights   10/15/21 0807  Weight: 164 lb (74.4 kg)    GENERAL:alert, no distress and comfortable SKIN: skin color, texture, turgor are normal, no rashes or significant lesions EYES: normal, Conjunctiva are pink and non-injected, sclera clear OROPHARYNX:no exudate, no erythema and lips, buccal mucosa, and tongue normal  NECK: supple, thyroid normal size, non-tender, without nodularity LYMPH:  no palpable lymphadenopathy in the cervical, axillary or inguinal LUNGS: clear to auscultation and percussion with normal breathing effort HEART: regular rate & rhythm and no murmurs and no lower extremity edema ABDOMEN:abdomen soft, non-tender and normal bowel sounds Musculoskeletal:no cyanosis of digits and no clubbing  NEURO: alert & oriented x 3 with fluent speech, no focal motor/sensory deficits  LABORATORY DATA:  I have reviewed the data as listed    Component Value Date/Time   NA 141 10/08/2021 0814   NA 141 09/29/2017 0830   K 3.8 10/08/2021 0814   K 3.6 09/29/2017 0830   CL 107 10/08/2021 0814   CO2 26 10/08/2021 0814   CO2 29 09/29/2017 0830   GLUCOSE 90 10/08/2021 0814   GLUCOSE 87 09/29/2017 0830   BUN 11 10/08/2021 0814   BUN 14.0 09/29/2017 0830   CREATININE 0.84  10/08/2021 0814   CREATININE 0.9 09/29/2017 0830   CALCIUM 8.9 10/08/2021 0814   CALCIUM 9.6 09/29/2017 0830   PROT 7.5 10/08/2021 0814   PROT 7.3 09/29/2017 0832   PROT 8.0 09/29/2017 0830   ALBUMIN 3.7 10/08/2021 0814   ALBUMIN 3.8 09/29/2017 0830   AST 23 10/08/2021 0814   AST 21 09/29/2017 0830   ALT 28 10/08/2021 0814   ALT 69 (H) 09/29/2017 0830   ALKPHOS 54 10/08/2021 0814   ALKPHOS 60 09/29/2017 0830   BILITOT 0.8 10/08/2021 0814   BILITOT 0.46 09/29/2017 0830   GFRNONAA >60 10/08/2021 0814   GFRAA >60 07/31/2020 0737    No results found for: SPEP, UPEP  Lab Results  Component Value Date   WBC 3.0 (L) 10/08/2021   NEUTROABS 1.0 (L) 10/08/2021   HGB 11.5 (L) 10/08/2021   HCT 36.7 10/08/2021   MCV 73.4 (L) 10/08/2021  PLT 206 10/08/2021      Chemistry      Component Value Date/Time   NA 141 10/08/2021 0814   NA 141 09/29/2017 0830   K 3.8 10/08/2021 0814   K 3.6 09/29/2017 0830   CL 107 10/08/2021 0814   CO2 26 10/08/2021 0814   CO2 29 09/29/2017 0830   BUN 11 10/08/2021 0814   BUN 14.0 09/29/2017 0830   CREATININE 0.84 10/08/2021 0814   CREATININE 0.9 09/29/2017 0830      Component Value Date/Time   CALCIUM 8.9 10/08/2021 0814   CALCIUM 9.6 09/29/2017 0830   ALKPHOS 54 10/08/2021 0814   ALKPHOS 60 09/29/2017 0830   AST 23 10/08/2021 0814   AST 21 09/29/2017 0830   ALT 28 10/08/2021 0814   ALT 69 (H) 09/29/2017 0830   BILITOT 0.8 10/08/2021 0814   BILITOT 0.46 09/29/2017 0830

## 2021-10-15 NOTE — Assessment & Plan Note (Signed)
Her recent myeloma panel showed that she is in remission Her slightly elevated free light chains and LDH is unrelated The patient is comfortable to remain on Revlimid indefinitely. 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

## 2021-10-15 NOTE — Assessment & Plan Note (Signed)
She had chronic back pain more than 5 years Her skeletal survey show degenerative arthritis which is not new She can continue conservative approach This is not due to her multiple myeloma

## 2021-10-16 ENCOUNTER — Other Ambulatory Visit: Payer: Self-pay

## 2021-10-16 DIAGNOSIS — C9001 Multiple myeloma in remission: Secondary | ICD-10-CM

## 2021-10-16 DIAGNOSIS — C9000 Multiple myeloma not having achieved remission: Secondary | ICD-10-CM

## 2021-10-17 ENCOUNTER — Other Ambulatory Visit: Payer: Self-pay | Admitting: Nurse Practitioner

## 2021-10-17 DIAGNOSIS — Z780 Asymptomatic menopausal state: Secondary | ICD-10-CM

## 2021-10-28 DIAGNOSIS — C9000 Multiple myeloma not having achieved remission: Secondary | ICD-10-CM | POA: Diagnosis not present

## 2021-10-28 DIAGNOSIS — Z1389 Encounter for screening for other disorder: Secondary | ICD-10-CM | POA: Diagnosis not present

## 2021-10-28 DIAGNOSIS — Z7984 Long term (current) use of oral hypoglycemic drugs: Secondary | ICD-10-CM | POA: Diagnosis not present

## 2021-10-28 DIAGNOSIS — Z0000 Encounter for general adult medical examination without abnormal findings: Secondary | ICD-10-CM | POA: Diagnosis not present

## 2021-10-28 DIAGNOSIS — I700 Atherosclerosis of aorta: Secondary | ICD-10-CM | POA: Diagnosis not present

## 2021-10-28 DIAGNOSIS — E041 Nontoxic single thyroid nodule: Secondary | ICD-10-CM | POA: Diagnosis not present

## 2021-10-28 DIAGNOSIS — G43909 Migraine, unspecified, not intractable, without status migrainosus: Secondary | ICD-10-CM | POA: Diagnosis not present

## 2021-10-28 DIAGNOSIS — E039 Hypothyroidism, unspecified: Secondary | ICD-10-CM | POA: Diagnosis not present

## 2021-10-28 DIAGNOSIS — I10 Essential (primary) hypertension: Secondary | ICD-10-CM | POA: Diagnosis not present

## 2021-10-28 DIAGNOSIS — E1169 Type 2 diabetes mellitus with other specified complication: Secondary | ICD-10-CM | POA: Diagnosis not present

## 2021-10-29 DIAGNOSIS — E039 Hypothyroidism, unspecified: Secondary | ICD-10-CM | POA: Diagnosis not present

## 2021-10-29 DIAGNOSIS — E1169 Type 2 diabetes mellitus with other specified complication: Secondary | ICD-10-CM | POA: Diagnosis not present

## 2021-10-29 DIAGNOSIS — E119 Type 2 diabetes mellitus without complications: Secondary | ICD-10-CM | POA: Diagnosis not present

## 2021-10-29 DIAGNOSIS — D508 Other iron deficiency anemias: Secondary | ICD-10-CM | POA: Diagnosis not present

## 2021-10-29 DIAGNOSIS — I10 Essential (primary) hypertension: Secondary | ICD-10-CM | POA: Diagnosis not present

## 2021-10-29 DIAGNOSIS — I200 Unstable angina: Secondary | ICD-10-CM | POA: Diagnosis not present

## 2021-10-29 DIAGNOSIS — K219 Gastro-esophageal reflux disease without esophagitis: Secondary | ICD-10-CM | POA: Diagnosis not present

## 2021-10-30 ENCOUNTER — Encounter: Payer: Self-pay | Admitting: Hematology and Oncology

## 2021-11-01 ENCOUNTER — Other Ambulatory Visit: Payer: Self-pay | Admitting: Hematology and Oncology

## 2021-11-01 NOTE — Telephone Encounter (Signed)
Pls refill electronically °

## 2021-11-04 ENCOUNTER — Other Ambulatory Visit: Payer: Self-pay

## 2021-11-04 DIAGNOSIS — C9001 Multiple myeloma in remission: Secondary | ICD-10-CM

## 2021-11-04 MED ORDER — LENALIDOMIDE 5 MG PO CAPS: ORAL_CAPSULE | ORAL | 0 refills | Status: DC

## 2021-11-04 NOTE — Telephone Encounter (Signed)
Ok great.

## 2021-11-08 ENCOUNTER — Encounter: Payer: Self-pay | Admitting: Hematology and Oncology

## 2021-11-12 ENCOUNTER — Inpatient Hospital Stay: Payer: Medicare Other | Attending: Hematology and Oncology

## 2021-11-12 ENCOUNTER — Other Ambulatory Visit: Payer: Self-pay

## 2021-11-12 DIAGNOSIS — C9001 Multiple myeloma in remission: Secondary | ICD-10-CM | POA: Diagnosis present

## 2021-11-12 DIAGNOSIS — Z006 Encounter for examination for normal comparison and control in clinical research program: Secondary | ICD-10-CM | POA: Insufficient documentation

## 2021-11-12 LAB — CBC WITH DIFFERENTIAL (CANCER CENTER ONLY)
Abs Immature Granulocytes: 0 10*3/uL (ref 0.00–0.07)
Basophils Absolute: 0 10*3/uL (ref 0.0–0.1)
Basophils Relative: 1 %
Eosinophils Absolute: 0.1 10*3/uL (ref 0.0–0.5)
Eosinophils Relative: 2 %
HCT: 37.2 % (ref 36.0–46.0)
Hemoglobin: 11.4 g/dL — ABNORMAL LOW (ref 12.0–15.0)
Immature Granulocytes: 0 %
Lymphocytes Relative: 55 %
Lymphs Abs: 2.2 10*3/uL (ref 0.7–4.0)
MCH: 22.7 pg — ABNORMAL LOW (ref 26.0–34.0)
MCHC: 30.6 g/dL (ref 30.0–36.0)
MCV: 74.1 fL — ABNORMAL LOW (ref 80.0–100.0)
Monocytes Absolute: 0.5 10*3/uL (ref 0.1–1.0)
Monocytes Relative: 13 %
Neutro Abs: 1.2 10*3/uL — ABNORMAL LOW (ref 1.7–7.7)
Neutrophils Relative %: 29 %
Platelet Count: 204 10*3/uL (ref 150–400)
RBC: 5.02 MIL/uL (ref 3.87–5.11)
RDW: 14.9 % (ref 11.5–15.5)
WBC Count: 4 10*3/uL (ref 4.0–10.5)
nRBC: 0 % (ref 0.0–0.2)

## 2021-11-12 MED ORDER — LENALIDOMIDE 5 MG PO CAPS: ORAL_CAPSULE | ORAL | 0 refills | Status: DC

## 2021-11-12 NOTE — Research (Signed)
Research - [facility] ECOG-ACRIN [id number] Maintenance Cycle 89, Day 1  Patient into clinic unaccompanied today for evaluation prior to beginning treatment Cycle 89 of revlimid.   Labs: CBC drawn per protocol.   Adverse Events Since Last Visit (in the past month): Patient reports ongoing fatigue (grade 2) not limiting self-care. 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 Daily moderate back ache (grade 2) is ongoing. Her insomnia (grade 2) is ongoing, unchanged from previous. Patient reports one day of mild diarrhea (grade 1). Patient reports one episode of migraine (grade 2); per the patient, it was a left-sided migraine in her head along with left sided numbness and weakness in her left arm. She denies any difficulty with speech, any facial drooping or any difficulty walking. She states symptoms come on slowly and always coincide with migraine on left side of her head. 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 Patient continues to exercise regularly, swimming laps in the pool 3 times weekly on average, as well as biking and completing yard work.  See AE table below.  Maintenance therapy with Revlimid: Patient returned completed cycle 88 Medication Calendar, confirming dosing at dose level -3, Revlimid 5mg  every other day, Days 1-21. Patient notes that no doses were missed. Patient given printed appointment calendar with Cycle 89 treatment days marked, to document doses for the next treatment cycle, scheduled to begin today pending lab results.  Plan: Reminded patient that Revlimid can increase risk of stroke. Instructed patient to notify her neurologist of the left sided numbness/weakness she experienced with her migraine episode. Also instructed patient if she ever experiences difficulty walking, moving her arm, difficulty with speech, facial drooping or increase in weakness symptoms she  should call 911 to go to ED immediately. Patient verbalized understanding. This research nurse will call patient after review of today's lab results to notify if okay to start cycle 89 of Revlimid today as planned. Next appointment scheduled on 12/10/21 for lab work and research visit. Patient verbalized understanding and is in agreement with this plan. Reminded patient to call our office if any new questions/concerns prior to next visit. She verbalized understanding.   Adverse Event Log Study/Protocol: [facility] ECOG E1A11 Maintenance Cycle 88 10/15/21-11/12/21 (end of Cycle 88 = 81/27/5170) Solicited &/or Reportable Events Grade Cycle # Comments  Anemia Grade 1 88    Hyperglycemia Grade 0    Lymphocyte count decreased Grade 0    Neutrophil count decreased Grade 1  88   Platelet count decreased Grade 0     Diarrhea Grade 1 88 One day of 3 loose stools over baseline. No medication needed to manage symptoms.  Dyspnea Grade 0    Edema: limbs Grade 0    Fatigue  Grade 2  88 Moderate, occasionally limiting ADLs (unchanged from previous)  Fever Grade 0    Insomnia  Grade 2   88 Trazodone PRN  Irritability Grade 0    Muscle weakness trunk Grade 0    Nausea Grade 0    Peripheral motor neuropathy Grade 0    Peripheral sensory neuropathy  Grade 2 88 Moderate symptoms, unchanged from previous  Rash acneiform Grade 0    Vomiting Grade 0     Non-reportable AEs (unsolicited, < Grade 3): Leukopenia (grade 1) Moderate pain (grade 2) Back pain 3.   Migraines (left sided headaches associated with left arm numbness and weakness) (grade 2)  835am -- Called patient and instructed her to start Revlimid as planned. Cycle 88 treatment plan completed and Cycle 89 plan released. Patient verbalized understanding.  Vickii Penna, RN, BSN, CPN Clinical Research Nurse I [phone]  11/12/2021 9:00 AM

## 2021-11-27 NOTE — Telephone Encounter (Signed)
Completed Emgality renewal PA on Cover My Meds. Key: [id number]. Awaiting determination from [facility].

## 2021-11-27 NOTE — Telephone Encounter (Signed)
Request Reference Number: [id number]. EMGALITY INJ 120MG /ML is approved through 11/30/2022. Your patient may now fill this prescription and it will be covered.  Approval faxed to pharmacy. Received a receipt of confirmation.

## 2021-12-05 ENCOUNTER — Other Ambulatory Visit: Payer: Self-pay

## 2021-12-05 ENCOUNTER — Telehealth: Payer: Self-pay | Admitting: *Deleted

## 2021-12-05 DIAGNOSIS — C9001 Multiple myeloma in remission: Secondary | ICD-10-CM

## 2021-12-05 MED ORDER — LENALIDOMIDE 5 MG PO CAPS: ORAL_CAPSULE | ORAL | 0 refills | Status: DC

## 2021-12-05 NOTE — Telephone Encounter (Signed)
Patient called for refill on Revlimid. Notified collaborative RN, Hassan Rowan to arrange refill.

## 2021-12-10 ENCOUNTER — Other Ambulatory Visit: Payer: Self-pay

## 2021-12-10 ENCOUNTER — Inpatient Hospital Stay: Payer: Medicare Other | Attending: Hematology and Oncology

## 2021-12-10 DIAGNOSIS — Z006 Encounter for examination for normal comparison and control in clinical research program: Secondary | ICD-10-CM | POA: Insufficient documentation

## 2021-12-10 DIAGNOSIS — C9001 Multiple myeloma in remission: Secondary | ICD-10-CM | POA: Insufficient documentation

## 2021-12-10 DIAGNOSIS — R946 Abnormal results of thyroid function studies: Secondary | ICD-10-CM | POA: Insufficient documentation

## 2021-12-10 DIAGNOSIS — Z79899 Other long term (current) drug therapy: Secondary | ICD-10-CM | POA: Diagnosis present

## 2021-12-10 LAB — CBC WITH DIFFERENTIAL (CANCER CENTER ONLY)
Abs Immature Granulocytes: 0.02 10*3/uL (ref 0.00–0.07)
Basophils Absolute: 0 10*3/uL (ref 0.0–0.1)
Basophils Relative: 1 %
Eosinophils Absolute: 0.1 10*3/uL (ref 0.0–0.5)
Eosinophils Relative: 3 %
HCT: 37.7 % (ref 36.0–46.0)
Hemoglobin: 11.8 g/dL — ABNORMAL LOW (ref 12.0–15.0)
Immature Granulocytes: 1 %
Lymphocytes Relative: 58 %
Lymphs Abs: 2.5 10*3/uL (ref 0.7–4.0)
MCH: 23 pg — ABNORMAL LOW (ref 26.0–34.0)
MCHC: 31.3 g/dL (ref 30.0–36.0)
MCV: 73.3 fL — ABNORMAL LOW (ref 80.0–100.0)
Monocytes Absolute: 0.5 10*3/uL (ref 0.1–1.0)
Monocytes Relative: 11 %
Neutro Abs: 1.1 10*3/uL — ABNORMAL LOW (ref 1.7–7.7)
Neutrophils Relative %: 26 %
Platelet Count: 187 10*3/uL (ref 150–400)
RBC: 5.14 MIL/uL — ABNORMAL HIGH (ref 3.87–5.11)
RDW: 14.7 % (ref 11.5–15.5)
WBC Count: 4.3 10*3/uL (ref 4.0–10.5)
nRBC: 0 % (ref 0.0–0.2)

## 2021-12-10 MED ORDER — LENALIDOMIDE 5 MG PO CAPS: ORAL_CAPSULE | ORAL | 0 refills | Status: DC

## 2021-12-10 NOTE — Research (Signed)
Research - [facility] ECOG-ACRIN [id number] Maintenance Cycle 90, Day 1  Patient into clinic unaccompanied today for evaluation prior to beginning treatment Cycle 90 of revlimid.   Labs: CBC drawn per protocol.   Adverse Events Since Last Visit (in the past month): Patient reports ongoing fatigue (grade 2) not limiting self-care. 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 Daily moderate back ache (grade 2) is ongoing. Her insomnia (grade 2) is ongoing, unchanged from previous; patient takes trazodone PRN. Patient reports 4 episodes of migraines (grade 2); per the patient, it was a left-sided migraine in her head along with left sided numbness and weakness in her left arm. She denies any difficulty with speech, any facial drooping or any difficulty walking. She states symptoms come on slowly and always coincide with migraine on left side of her head. 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 She manages these migraines with PRN tramadol. Patient continues to exercise regularly, swimming laps in the pool as well as biking.  See AE table below.  Maintenance therapy with Revlimid: Patient returned completed cycle 89 Medication Calendar, confirming dosing at dose level -3, Revlimid 5mg  every other day, Days 1-21. Patient notes that no doses were missed. Patient given printed appointment calendar with Cycle 90 treatment days marked, to document doses for the next treatment cycle, scheduled to begin today pending lab results.  Plan: Reminded patient that Revlimid can increase risk of stroke. Instructed patient to notify her neurologist of the left sided numbness/weakness she experienced with her migraine episode. Also instructed patient if she ever experiences difficulty walking, moving her arm, difficulty with speech, facial drooping or increase in weakness symptoms she should call 911 to go to ED  immediately. Patient verbalized understanding. This research nurse will call patient after review of today's lab results to notify if okay to start cycle 90 of Revlimid today as planned. Next appointment scheduled on 12/31/21 for lab work. Patient reminded she does not need to see research on the 31st; will see patient during her visit with Dr Alvy Bimler on 01/07/22. Patient verbalized understanding and is in agreement with this plan. Reminded patient to call our office if any new questions/concerns prior to next visit. She verbalized understanding.   Adverse Event Log Study/Protocol: [facility] ECOG E1A11 Maintenance Cycle 89 11/12/21-12/10/21 (end of Cycle 89 = 04/05/3892) Solicited &/or Reportable Events Grade Cycle # Comments  Anemia Grade 1 89    Hyperglycemia Grade 0    Lymphocyte count decreased Grade 0    Neutrophil count decreased Grade 1  89   Platelet count decreased Grade 0     Diarrhea Grade 0    Dyspnea Grade 0    Edema: limbs Grade 0    Fatigue  Grade 2  89 Moderate, occasionally limiting ADLs (unchanged from previous)  Fever Grade 0    Insomnia  Grade 2   89 Trazodone PRN  Irritability Grade 0    Muscle weakness trunk Grade 0    Nausea Grade 0    Peripheral motor neuropathy Grade 0    Peripheral sensory neuropathy  Grade 2 89 Moderate symptoms, unchanged from previous  Rash acneiform Grade 0    Vomiting Grade 0     Non-reportable AEs (unsolicited, < Grade 3): Moderate pain (grade 2) Back pain 2.   Migraines (left sided headaches associated with left arm numbness and weakness) (grade 2)  0752am --  Called patient and LVM instructing her to start Revlimid as planned. Cycle 89 treatment plan completed and Cycle 90 plan released.  Vickii Penna, RN, BSN, CPN Clinical Research Nurse I [phone]  12/10/2021 7:42 AM

## 2021-12-18 ENCOUNTER — Other Ambulatory Visit: Payer: Self-pay | Admitting: *Deleted

## 2021-12-18 MED ORDER — EMGALITY 120 MG/ML ~~LOC~~ SOAJ: 120.0000 mg | SUBCUTANEOUS | 9 refills | Status: DC

## 2021-12-20 ENCOUNTER — Other Ambulatory Visit: Payer: Self-pay | Admitting: *Deleted

## 2021-12-20 MED ORDER — EMGALITY 120 MG/ML ~~LOC~~ SOAJ: 120.0000 mg | SUBCUTANEOUS | 9 refills | Status: DC

## 2021-12-20 NOTE — Telephone Encounter (Signed)
Pt called asking fr her Galcanezumab-gnlm (EMGALITY) 120 MG/ML SOAJ be sent to [place], [place].

## 2021-12-20 NOTE — Telephone Encounter (Signed)
Done

## 2021-12-23 ENCOUNTER — Encounter: Payer: Self-pay | Admitting: Neurology

## 2021-12-24 DIAGNOSIS — E039 Hypothyroidism, unspecified: Secondary | ICD-10-CM | POA: Diagnosis not present

## 2021-12-24 DIAGNOSIS — E1169 Type 2 diabetes mellitus with other specified complication: Secondary | ICD-10-CM | POA: Diagnosis not present

## 2021-12-24 DIAGNOSIS — I200 Unstable angina: Secondary | ICD-10-CM | POA: Diagnosis not present

## 2021-12-24 DIAGNOSIS — I10 Essential (primary) hypertension: Secondary | ICD-10-CM | POA: Diagnosis not present

## 2021-12-31 ENCOUNTER — Telehealth: Payer: Self-pay | Admitting: *Deleted

## 2021-12-31 ENCOUNTER — Inpatient Hospital Stay: Payer: Medicare Other

## 2021-12-31 ENCOUNTER — Other Ambulatory Visit: Payer: Self-pay

## 2021-12-31 ENCOUNTER — Other Ambulatory Visit: Payer: Self-pay | Admitting: *Deleted

## 2021-12-31 DIAGNOSIS — C9001 Multiple myeloma in remission: Secondary | ICD-10-CM

## 2021-12-31 DIAGNOSIS — Z006 Encounter for examination for normal comparison and control in clinical research program: Secondary | ICD-10-CM | POA: Diagnosis not present

## 2021-12-31 LAB — LACTATE DEHYDROGENASE: LDH: 147 U/L (ref 98–192)

## 2021-12-31 LAB — CBC WITH DIFFERENTIAL (CANCER CENTER ONLY)
Abs Immature Granulocytes: 0.01 10*3/uL (ref 0.00–0.07)
Basophils Absolute: 0 10*3/uL (ref 0.0–0.1)
Basophils Relative: 0 %
Eosinophils Absolute: 0.1 10*3/uL (ref 0.0–0.5)
Eosinophils Relative: 4 %
HCT: 36.7 % (ref 36.0–46.0)
Hemoglobin: 11.3 g/dL — ABNORMAL LOW (ref 12.0–15.0)
Immature Granulocytes: 0 %
Lymphocytes Relative: 51 %
Lymphs Abs: 1.5 10*3/uL (ref 0.7–4.0)
MCH: 22.7 pg — ABNORMAL LOW (ref 26.0–34.0)
MCHC: 30.8 g/dL (ref 30.0–36.0)
MCV: 73.7 fL — ABNORMAL LOW (ref 80.0–100.0)
Monocytes Absolute: 0.4 10*3/uL (ref 0.1–1.0)
Monocytes Relative: 13 %
Neutro Abs: 0.9 10*3/uL — ABNORMAL LOW (ref 1.7–7.7)
Neutrophils Relative %: 32 %
Platelet Count: 202 10*3/uL (ref 150–400)
RBC: 4.98 MIL/uL (ref 3.87–5.11)
RDW: 14.8 % (ref 11.5–15.5)
WBC Count: 2.9 10*3/uL — ABNORMAL LOW (ref 4.0–10.5)
nRBC: 0 % (ref 0.0–0.2)

## 2021-12-31 LAB — CMP (CANCER CENTER ONLY)
ALT: 27 U/L (ref 0–44)
AST: 23 U/L (ref 15–41)
Albumin: 3.8 g/dL (ref 3.5–5.0)
Alkaline Phosphatase: 46 U/L (ref 38–126)
Anion gap: 5 (ref 5–15)
BUN: 10 mg/dL (ref 8–23)
CO2: 29 mmol/L (ref 22–32)
Calcium: 9 mg/dL (ref 8.9–10.3)
Chloride: 105 mmol/L (ref 98–111)
Creatinine: 0.91 mg/dL (ref 0.44–1.00)
GFR, Estimated: >60 mL/min (ref >60)
Glucose, Bld: 83 mg/dL (ref 70–99)
Potassium: 3.7 mmol/L (ref 3.5–5.1)
Sodium: 139 mmol/L (ref 135–145)
Total Bilirubin: 0.7 mg/dL (ref 0.3–1.2)
Total Protein: 7.3 g/dL (ref 6.5–8.1)

## 2021-12-31 LAB — TSH: TSH: 0.766 u[IU]/mL (ref 0.308–3.960)

## 2021-12-31 NOTE — Telephone Encounter (Signed)
[id number] Study: Informed patient of ANC 0.9 today and needs to be at least 1.0 to start next cycle of revlimid next week. Therefore, we will need to recheck it next week before she sees Dr. Alvy Bimler. Lab appt for CBC added at 7:30 am on 01/07/22. Patient verbalized understanding. \ Foye Spurling, BSN, RN, [place] Nurse II 12/31/2021 3:01 PM

## 2021-12-31 NOTE — Telephone Encounter (Signed)
Physician prescription portion of Emgality application completed, signed by Dr Krista Blue (for Dr Jaynee Eagles while she is out of the office) and then it was faxed to [employer]. Received a receipt of confirmation.

## 2022-01-01 LAB — KAPPA/LAMBDA LIGHT CHAINS
Kappa free light chain: 31.8 mg/L — ABNORMAL HIGH (ref 3.3–19.4)
Kappa, lambda light chain ratio: 1.57 (ref 0.26–1.65)
Lambda free light chains: 20.3 mg/L (ref 5.7–26.3)

## 2022-01-02 ENCOUNTER — Other Ambulatory Visit: Payer: Self-pay | Admitting: Hematology and Oncology

## 2022-01-02 DIAGNOSIS — C9001 Multiple myeloma in remission: Secondary | ICD-10-CM

## 2022-01-02 LAB — MULTIPLE MYELOMA PANEL, SERUM
Albumin SerPl Elph-Mcnc: 3.4 g/dL (ref 2.9–4.4)
Albumin/Glob SerPl: 1.1 (ref 0.7–1.7)
Alpha 1: 0.2 g/dL (ref 0.0–0.4)
Alpha2 Glob SerPl Elph-Mcnc: 0.5 g/dL (ref 0.4–1.0)
B-Globulin SerPl Elph-Mcnc: 1 g/dL (ref 0.7–1.3)
Gamma Glob SerPl Elph-Mcnc: 1.7 g/dL (ref 0.4–1.8)
Globulin, Total: 3.4 g/dL (ref 2.2–3.9)
IgA: 111 mg/dL (ref 87–352)
IgG (Immunoglobin G), Serum: 1807 mg/dL — ABNORMAL HIGH (ref 586–1602)
IgM (Immunoglobulin M), Srm: 57 mg/dL (ref 26–217)
Total Protein ELP: 6.8 g/dL (ref 6.0–8.5)

## 2022-01-02 NOTE — Telephone Encounter (Signed)
Pls refill electronically °

## 2022-01-07 ENCOUNTER — Encounter: Payer: Self-pay | Admitting: Hematology and Oncology

## 2022-01-07 ENCOUNTER — Inpatient Hospital Stay: Payer: Medicare Other | Attending: Hematology and Oncology | Admitting: Hematology and Oncology

## 2022-01-07 ENCOUNTER — Encounter: Payer: Self-pay | Admitting: *Deleted

## 2022-01-07 ENCOUNTER — Inpatient Hospital Stay: Payer: Medicare Other

## 2022-01-07 ENCOUNTER — Other Ambulatory Visit: Payer: Self-pay

## 2022-01-07 DIAGNOSIS — Z006 Encounter for examination for normal comparison and control in clinical research program: Secondary | ICD-10-CM | POA: Diagnosis not present

## 2022-01-07 DIAGNOSIS — C9001 Multiple myeloma in remission: Secondary | ICD-10-CM

## 2022-01-07 DIAGNOSIS — D61811 Other drug-induced pancytopenia: Secondary | ICD-10-CM

## 2022-01-07 DIAGNOSIS — G43719 Chronic migraine without aura, intractable, without status migrainosus: Secondary | ICD-10-CM

## 2022-01-07 DIAGNOSIS — G43709 Chronic migraine without aura, not intractable, without status migrainosus: Secondary | ICD-10-CM | POA: Insufficient documentation

## 2022-01-07 DIAGNOSIS — Z9221 Personal history of antineoplastic chemotherapy: Secondary | ICD-10-CM | POA: Insufficient documentation

## 2022-01-07 LAB — CBC WITH DIFFERENTIAL (CANCER CENTER ONLY)
Abs Immature Granulocytes: 0.01 10*3/uL (ref 0.00–0.07)
Basophils Absolute: 0 10*3/uL (ref 0.0–0.1)
Basophils Relative: 1 %
Eosinophils Absolute: 0.1 10*3/uL (ref 0.0–0.5)
Eosinophils Relative: 4 %
HCT: 36.8 % (ref 36.0–46.0)
Hemoglobin: 11.2 g/dL — ABNORMAL LOW (ref 12.0–15.0)
Immature Granulocytes: 0 %
Lymphocytes Relative: 45 %
Lymphs Abs: 1.6 10*3/uL (ref 0.7–4.0)
MCH: 22.4 pg — ABNORMAL LOW (ref 26.0–34.0)
MCHC: 30.4 g/dL (ref 30.0–36.0)
MCV: 73.7 fL — ABNORMAL LOW (ref 80.0–100.0)
Monocytes Absolute: 0.4 10*3/uL (ref 0.1–1.0)
Monocytes Relative: 13 %
Neutro Abs: 1.3 10*3/uL — ABNORMAL LOW (ref 1.7–7.7)
Neutrophils Relative %: 37 %
Platelet Count: 179 10*3/uL (ref 150–400)
RBC: 4.99 MIL/uL (ref 3.87–5.11)
RDW: 15.1 % (ref 11.5–15.5)
WBC Count: 3.4 10*3/uL — ABNORMAL LOW (ref 4.0–10.5)
nRBC: 0 % (ref 0.0–0.2)

## 2022-01-07 MED ORDER — LENALIDOMIDE 5 MG PO CAPS: ORAL_CAPSULE | ORAL | 0 refills | Status: DC

## 2022-01-07 NOTE — Research (Signed)
Research - [facility] ECOG-ACRIN [id number] Maintenance Cycle 91, Day 1  Patient into clinic unaccompanied today for evaluation prior to beginning treatment Cycle 91 of revlimid.   Labs w/myeloma panel: Obtained on 12/31/2021 within 7-day window per protocol, to allow adequate time for results prior to treatment. Results reviewed by Dr. Alvy Bimler and found to be within parameters for continued dosing at Maintenance phase Dose Level -3, Revlimid 5 mg every other day for 21 days, every 28 days. 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 Per MD, bone marrow biopsy and aspirate would not be considered standard of care at this time.   History and physical exam: See MD note dated today.     Adverse Events Since Last Visit (in the past month): Patient reports ongoing fatigue (grade 2) not limiting self-care. 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 Daily moderate back ache (grade 2) is ongoing. Her insomnia (grade 2) is ongoing, unchanged from previous; patient takes trazodone PRN. Patient reports diarrhea x 2 days with 2-3 stools per day (grade 1).  Patient continues to exercise regularly, swimming laps in the pool as well as riding the stationary bike.   See AE table below.  Maintenance therapy with Revlimid: Based on lab results, AE review and history and physical by Dr. Alvy Bimler, patient meets criteria for continued treatment with Revlimid, which she is in agreement to do. Patient is aware that treatment will continue indefinitely, unless there is evidence of disease recurrence, adverse effects requiring treatment discontinuation, or patient/physician decision to discontinue therapy. Patient returned completed cycle 90 Medication Calendar, confirming dosing at dose level -3, Revlimid 49m every other day, Days 1-21. Patient notes that no doses were missed. Patient given printed appointment calendar with Cycle 91 treatment days marked, to document doses for  the next treatment cycle, scheduled to begin today.  Plan: Patient will continue with monthly clinic visits with lab. These are due on 02/04/22 and 03/04/22 for hematology and AE assessments prior to beginning subsequent treatment cycles. Myeloma panel and other labs, to be performed on 03/25/22 in advance of the next MD clinic visit at the end of Cycle 93, scheduled for 04/01/22. Reminded patient to call our office if any new questions/concerns prior to next visit. She verbalized understanding.   Adverse Event Log Study/Protocol: [facility] ECOG E1A11 Maintenance Cycles 88-90 10/15/21-01/07/22 (end of Cycle 929=25/1/88 Solicited &/or Reportable Events Grade Attribution to lenalidomide Cycle # Comments  Anemia Grade 1 Definite 88, 89,90    Hyperglycemia Grade 0 -    Lymphocyte count decreased Grade 0 -    Neutrophil count decreased Grade 2  Definite 88, 89, 90   Platelet count decreased Grade 0 -     Diarrhea Grade 1 Possible 88, 90   Dyspnea Grade 0 -    Edema: limbs Grade 0 -    Fatigue  Grade 2  Probable 88, 89, 90 Moderate, occasionally limiting ADLs (unchanged from previous)  Fever Grade 0 -    Insomnia  Grade 2   Unlikely 88, 89, 90 Trazodone PRN  Irritability Grade 0 -    Muscle weakness trunk Grade 0 -    Nausea Grade 0     Peripheral motor neuropathy Grade 0 -    Peripheral sensory neuropathy  Grade 2 Unlikely 88, 89, 90 Moderate symptoms, unchanged from previous  Rash acneiform Grade 0     Vomiting Grade 0 -      Non-reportable AEs (unsolicited, < Grade 3): Moderate  pain (grade 2) Back pain 2.  Decreased White Blood Cells (grade 1)   Foye Spurling, BSN, RN Clinical Research Nurse 01/07/2022

## 2022-01-07 NOTE — Assessment & Plan Note (Signed)
This is due to her treatment She is not symptomatic Observe 

## 2022-01-07 NOTE — Progress Notes (Signed)
[place] OFFICE PROGRESS NOTE  Patient Care Team: Wenda Low, MD as PCP - General (Internal Medicine) Heath Lark, MD as Consulting Physician (Hematology and Oncology)  ASSESSMENT & PLAN:  Multiple myeloma in remission [facility]) Her recent myeloma panel showed that she is in remission Her slightly elevated free light chains and LDH is unrelated The patient is comfortable to remain on Revlimid indefinitely. 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   All questions were answered. The patient knows to call the clinic with any problems, questions or concerns. The total time spent in the appointment was 20 minutes encounter with patients including review of chart and various tests results, discussions about plan of care and coordination of care plan   Heath Lark, MD 01/07/2022 8:36 AM  INTERVAL HISTORY: Please see below for problem oriented charting. she returns for treatment follow-up on Revlimid as part of research protocol as maintenance treatment for multiple myeloma She tolerated recent treatment well No recent infection She have lost some weight due to additional exercise No bone pain She has occasional intermittent migraine  REVIEW OF SYSTEMS:   Constitutional: Denies fevers, chills or abnormal weight loss Eyes: Denies blurriness of vision Ears, nose, mouth, throat, and face: Denies mucositis or sore throat Respiratory: Denies cough, dyspnea or wheezes Cardiovascular: Denies palpitation, chest discomfort or lower extremity swelling Gastrointestinal:   Denies nausea, heartburn or change in bowel habits Skin: Denies abnormal skin rashes Lymphatics: Denies new lymphadenopathy or easy bruising Neurological:Denies numbness, tingling or new weaknesses Behavioral/Psych: Mood is stable, no new changes  All other systems were reviewed with the patient and are negative.  I have reviewed the past medical history, past surgical history, social history and family history with the patient and they are unchanged from previous note.  ALLERGIES:  is allergic to metformin.  MEDICATIONS:  Current Outpatient Medications  Medication Sig Dispense Refill   aspirin 325 MG tablet Take 325 mg by mouth daily.      Cholecalciferol (VITAMIN D3) 2000 UNITS TABS Take 2,000 Units by mouth daily.     dorzolamide-timolol (COSOPT) 22.3-6.8 MG/ML ophthalmic solution Place 1 drop into both eyes 2 (two) times daily.     Galcanezumab-gnlm (EMGALITY) 120 MG/ML SOAJ Inject 120 mg into the skin every 30 (thirty) days. 1 mL 9   hydrochlorothiazide (HYDRODIURIL) 25 MG tablet Take 25 mg by mouth every evening.      lenalidomide (REVLIMID) 5 MG capsule TAKE 1 CAPSULE BY MOUTH EVERY  OTHER DAY FOR 21 DAYS ON, THEN 7 DAYS OFF. 11 capsule 0   levothyroxine (SYNTHROID, LEVOTHROID) 75 MCG tablet Take 75 mcg by mouth daily before breakfast.     methocarbamol (ROBAXIN) 500 MG tablet Take 1 tablet (500 mg total) by mouth every 8 (eight) hours as needed for muscle spasms. 90 tablet 1   Multiple Vitamins-Minerals (CENTRUM SILVER PO) Take 1 tablet by mouth daily.      polyethylene glycol (MIRALAX / GLYCOLAX) packet Take 17 g by mouth daily as needed for mild constipation.      RESTASIS 0.05 % ophthalmic emulsion PLACE 1 DROP IN BOTH  EYES IN THE EVENING  4   rosuvastatin (CRESTOR) 5 MG tablet Take 5 mg by mouth daily at 2 PM.     sitaGLIPtin (JANUVIA) 50 MG tablet Take 50 mg by mouth daily.     traMADol (ULTRAM) 50 MG tablet Take by mouth every 6 (six) hours as needed.     traZODone  (DESYREL) 50 MG tablet TAKE 1 TABLET (50 MG TOTAL) BY MOUTH AT BEDTIME. 90 tablet 3   venlafaxine XR (EFFEXOR-XR) 150 MG 24 hr capsule Take 1 capsule (150 mg total) by mouth daily with breakfast. 90 capsule 3   verapamil (VERELAN PM) 120 MG 24 hr capsule Take 1 capsule (120 mg total) by mouth daily. 90 capsule 3   No current facility-administered medications for this visit.   Facility-Administered Medications Ordered in Other Visits  Medication Dose Route Frequency Provider Last Rate Last Admin   gadopentetate dimeglumine (MAGNEVIST) injection 15 mL  15 mL Intravenous Once PRN Melvenia Beam, MD        SUMMARY OF ONCOLOGIC HISTORY: Oncology History Overview Note  ISS stage 1 IgG lambda subtype (serum albumin 3.6, Beta2 microglobulin 2.32) Durie Salmon Stage 1   Multiple myeloma in remission ([place])  10/10/2013 Imaging   Skeletal survery was negative   11/09/2013 Bone Marrow Biopsy   BM biopsy confirmed myeloma, 76% involved, IgG lambda subtype   12/06/2013 - 08/29/2014 Chemotherapy   Sh received chemo with revlimid, Velcade, Dexamethasone and Zometa. Patient particpated in clinical research [facility] [id number]   02/23/2014 Bone Marrow Biopsy   Repeat bone marrow biopsy showed 5% involvement   03/31/2014 Adverse Reaction   Zometa was discontinued due to osteonecrosis of the jaw.   05/05/2014 Imaging   Imaging study of the neck showed no explanation that could cause right neck pain. She is noted to have incidental left upper lung nodule. Plan to repeat imaging study in 3 months.   09/06/2014 Imaging   Bone survey showed no evidence of fracture   09/14/2014 Bone Marrow Biopsy   Bone marrow biopsy showed 8% residual plasma cells by manual count but none on the biopsy specimen   09/26/2014 -  Chemotherapy   She is started on cycle 1 of maintenance Revlimid   01/31/2015 Imaging    chest x-ray showed pneumonia. Treatment was placed on hold.   05/03/2015 Bone Marrow Biopsy   Accession: [id number]  repeat bone marrow aspirate and biopsy show 5% residual plasma cells   10/14/2016 Bone Marrow Biopsy   Bone marrow biopsy showed the plasma cells represent 4% of all cells with lack of large aggregates or sheets. To assess the plasma cell clonality, immunohistochemical stains is performed and it lack clonality. Normal cytogenetics and FISH   01/09/2017 Imaging   CT chest showed ground-glass 1.5 cm apical left upper lobe pulmonary nodule. Initial follow-up with CT at 6-12 months is recommended to confirm persistence. If persistent, repeat CT is recommended every 2 years until 5 years of stability has been established. This recommendation follows the consensus statement: Guidelines for Management of Incidental Pulmonary Nodules Detected on CT Images: From the Fleischner Society 2017; Radiology 2017; 284:228-243. 2. Mild patchy ground-glass opacities in the right upper lobe, probably inflammatory, which can also be reassessed on follow-up chest CT performed for the above dominant ground-glass nodule. 3. Solitary 3 mm right lower lobe solid pulmonary nodule, which can also be reassessed on follow-up chest CT performed for the above dominant ground-glass nodule. 4. No thoracic adenopathy. 5. Aortic atherosclerosis.  Two-vessel  coronary atherosclerosis.   06/30/2017 Imaging   1. No interval change in the 11 x 13 mm ground-glass nodule left upper lobe. Given the nearly 6 months of imaging stability, repeat CT is recommended every 2 years until 5 years of stability has been established. This recommendation follows the consensus statement: Guidelines for Management of Incidental Pulmonary Nodules Detected on CT Images: From the Fleischner Society 2017; Radiology 2017; 284:228-243. 2. Interval resolution of the tiny patchy ground-glass nodules right upper lobe, likely infectious/inflammatory etiology. 3. Stable 3 mm right lower lobe pulmonary nodule. Attention on follow-up recommended. 4.  Aortic Atherosclerois  (ICD10-170.0)   09/29/2017 Imaging   Skeletal survey 1. Questionable new tiny lucency noted the posterior portion of C3 vertebral body. This may represent a small lytic lesion.  2. No definite thoracic lesion noted on today's exam. Stable lucencies in the left ilium and acetabulum.  3. No other focal abnormalities identified. The left hip is unremarkable .   04/16/2021 -  Chemotherapy   Patient is on Treatment Plan : MYELOMA Research [facility] E1A11 Arm D Maintenance Lenalidomide q28d       PHYSICAL EXAMINATION: ECOG PERFORMANCE STATUS: 0 - Asymptomatic  Vitals:   01/07/22 0756  BP: 140/74  Pulse: 70  Resp: 18  Temp: (!) 97.4 F (36.3 C)  SpO2: 100%   Filed Weights   01/07/22 0756  Weight: 158 lb 9.6 oz (71.9 kg)    GENERAL:alert, no distress and comfortable SKIN: skin color, texture, turgor are normal, no rashes or significant lesions EYES: normal, Conjunctiva are pink and non-injected, sclera clear OROPHARYNX:no exudate, no erythema and lips, buccal mucosa, and tongue normal  NECK: supple, thyroid normal size, non-tender, without nodularity LYMPH:  no palpable lymphadenopathy in the cervical, axillary or inguinal LUNGS: clear to auscultation and percussion with normal breathing effort HEART: regular rate & rhythm and no murmurs and no lower extremity edema ABDOMEN:abdomen soft, non-tender and normal bowel sounds Musculoskeletal:no cyanosis of digits and no clubbing  NEURO: alert & oriented x 3 with fluent speech, no focal motor/sensory deficits  LABORATORY DATA:  I have reviewed the data as listed    Component Value Date/Time   NA 139 12/31/2021 0741   NA 141 09/29/2017 0830   K 3.7 12/31/2021 0741   K 3.6 09/29/2017 0830   CL 105 12/31/2021 0741   CO2 29 12/31/2021 0741   CO2 29 09/29/2017 0830   GLUCOSE 83 12/31/2021 0741   GLUCOSE 87 09/29/2017 0830   BUN 10 12/31/2021 0741   BUN 14.0 09/29/2017 0830   CREATININE 0.91 12/31/2021 0741   CREATININE 0.9  09/29/2017 0830   CALCIUM 9.0 12/31/2021 0741   CALCIUM 9.6 09/29/2017 0830   PROT 7.3 12/31/2021 0741   PROT 7.3 09/29/2017 0832   PROT 8.0 09/29/2017 0830   ALBUMIN 3.8 12/31/2021 0741   ALBUMIN 3.8 09/29/2017 0830   AST 23 12/31/2021 0741   AST 21 09/29/2017 0830   ALT 27 12/31/2021 0741   ALT 69 (H) 09/29/2017 0830   ALKPHOS 46 12/31/2021 0741   ALKPHOS 60 09/29/2017 0830   BILITOT 0.7 12/31/2021 0741   BILITOT 0.46 09/29/2017 0830   GFRNONAA >60 12/31/2021 0741   GFRAA >60 07/31/2020 0737    No results found for: SPEP, UPEP  Lab Results  Component Value Date   WBC 3.4 (L) 01/07/2022   NEUTROABS 1.3 (L) 01/07/2022   HGB 11.2 (L) 01/07/2022   HCT 36.8 01/07/2022   MCV 73.7 (L) 01/07/2022   PLT  179 01/07/2022      Chemistry      Component Value Date/Time   NA 139 12/31/2021 0741   NA 141 09/29/2017 0830   K 3.7 12/31/2021 0741   K 3.6 09/29/2017 0830   CL 105 12/31/2021 0741   CO2 29 12/31/2021 0741   CO2 29 09/29/2017 0830   BUN 10 12/31/2021 0741   BUN 14.0 09/29/2017 0830   CREATININE 0.91 12/31/2021 0741   CREATININE 0.9 09/29/2017 0830      Component Value Date/Time   CALCIUM 9.0 12/31/2021 0741   CALCIUM 9.6 09/29/2017 0830   ALKPHOS 46 12/31/2021 0741   ALKPHOS 60 09/29/2017 0830   AST 23 12/31/2021 0741   AST 21 09/29/2017 0830   ALT 27 12/31/2021 0741   ALT 69 (H) 09/29/2017 0830   BILITOT 0.7 12/31/2021 0741   BILITOT 0.46 09/29/2017 0830

## 2022-01-07 NOTE — Assessment & Plan Note (Signed)
Her recent myeloma panel showed that she is in remission Her slightly elevated free light chains and LDH is unrelated The patient is comfortable to remain on Revlimid indefinitely. 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

## 2022-01-07 NOTE — Assessment & Plan Note (Signed)
She has intermittent migrainous headache Observe only

## 2022-01-08 ENCOUNTER — Telehealth: Payer: Self-pay | Admitting: Hematology and Oncology

## 2022-01-08 ENCOUNTER — Other Ambulatory Visit: Payer: Self-pay | Admitting: *Deleted

## 2022-01-08 DIAGNOSIS — C9001 Multiple myeloma in remission: Secondary | ICD-10-CM

## 2022-01-08 NOTE — Telephone Encounter (Signed)
Sch per 2/8 inbasket, pt aware °

## 2022-01-13 NOTE — Telephone Encounter (Signed)
Was on hold for over 20 minutes with [employer]. Was able to speak with staff and was advised application is still pending determination, hopefully by the end of this week.

## 2022-01-13 NOTE — Telephone Encounter (Signed)
I tried to call the [employer] but there's a 20 minute wait right now. Will try again later.

## 2022-01-14 MED ORDER — EMGALITY 120 MG/ML ~~LOC~~ SOAJ: 120.0000 mg | SUBCUTANEOUS | 0 refills | Status: DC

## 2022-01-14 NOTE — Telephone Encounter (Signed)
Pt came by the office and picked up 1 box of 2 Emgality 120 mg pens. Pt aware this is for February and March doses. Pt was very appreciative. I also provided a copy of pt's med list. Sample order was placed.

## 2022-01-14 NOTE — Addendum Note (Signed)
Addended by: Gildardo Griffes on: 01/14/2022 11:12 AM   Modules accepted: Orders

## 2022-01-27 ENCOUNTER — Telehealth: Payer: Self-pay | Admitting: *Deleted

## 2022-01-27 NOTE — Telephone Encounter (Signed)
Received fax for LILLY CARES Emgality 120mg  /ml inject into skin every 30 days.  (Form completed and to be faxed after signed).

## 2022-01-31 ENCOUNTER — Other Ambulatory Visit: Payer: Self-pay

## 2022-01-31 DIAGNOSIS — C9001 Multiple myeloma in remission: Secondary | ICD-10-CM

## 2022-01-31 MED ORDER — LENALIDOMIDE 5 MG PO CAPS: ORAL_CAPSULE | ORAL | 0 refills | Status: DC

## 2022-02-04 ENCOUNTER — Other Ambulatory Visit: Payer: Self-pay

## 2022-02-04 ENCOUNTER — Inpatient Hospital Stay: Payer: Medicare Other | Attending: Hematology and Oncology

## 2022-02-04 ENCOUNTER — Encounter: Payer: Self-pay | Admitting: Hematology and Oncology

## 2022-02-04 ENCOUNTER — Encounter: Payer: Self-pay | Admitting: *Deleted

## 2022-02-04 DIAGNOSIS — Z006 Encounter for examination for normal comparison and control in clinical research program: Secondary | ICD-10-CM | POA: Diagnosis not present

## 2022-02-04 DIAGNOSIS — C9001 Multiple myeloma in remission: Secondary | ICD-10-CM | POA: Insufficient documentation

## 2022-02-04 LAB — CBC WITH DIFFERENTIAL (CANCER CENTER ONLY)
Abs Immature Granulocytes: 0 10*3/uL (ref 0.00–0.07)
Basophils Absolute: 0 10*3/uL (ref 0.0–0.1)
Basophils Relative: 1 %
Eosinophils Absolute: 0.1 10*3/uL (ref 0.0–0.5)
Eosinophils Relative: 3 %
HCT: 36.6 % (ref 36.0–46.0)
Hemoglobin: 11.5 g/dL — ABNORMAL LOW (ref 12.0–15.0)
Immature Granulocytes: 0 %
Lymphocytes Relative: 53 %
Lymphs Abs: 2 10*3/uL (ref 0.7–4.0)
MCH: 22.9 pg — ABNORMAL LOW (ref 26.0–34.0)
MCHC: 31.4 g/dL (ref 30.0–36.0)
MCV: 72.9 fL — ABNORMAL LOW (ref 80.0–100.0)
Monocytes Absolute: 0.5 10*3/uL (ref 0.1–1.0)
Monocytes Relative: 13 %
Neutro Abs: 1.1 10*3/uL — ABNORMAL LOW (ref 1.7–7.7)
Neutrophils Relative %: 30 %
Platelet Count: 176 10*3/uL (ref 150–400)
RBC: 5.02 MIL/uL (ref 3.87–5.11)
RDW: 14.9 % (ref 11.5–15.5)
WBC Count: 3.7 10*3/uL — ABNORMAL LOW (ref 4.0–10.5)
nRBC: 0 % (ref 0.0–0.2)

## 2022-02-04 MED ORDER — LENALIDOMIDE 5 MG PO CAPS: ORAL_CAPSULE | ORAL | 0 refills | Status: DC

## 2022-02-04 NOTE — Research (Signed)
Research - [facility] ECOG-ACRIN [id number] Maintenance Cycle 92, Day 1 ? ?Patient into clinic unaccompanied today for evaluation prior to beginning treatment Cycle 92 of revlimid.  ? ?Labs: CBC drawn per protocol.  ? ?Adverse Events Since Last Visit (in the past month): Patient reports ongoing fatigue (grade 2) not limiting self-care. 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 Almost daily moderate back ache (grade 2) is ongoing. Patient reports feeling "inflammation" in her back a few days this month first thing in the mornings. She describes this as a stiffness which resolves after about 30 minutes of being up and moving around. It does not interfere with any ADLs and she does not take any medications for it (grade 1). She also reports one episode, aprox 30 minutes of jaw pain in the morning which she attributes to grinding her teeth at night. It was mild and resolved on it's own without any intervention (grade 1). She has a dental guard but does not use it very often.  ?Patient continues to exercise regularly, swimming laps in the pool as well as riding the stationary bike on alternate days.   ?See AE table below. ? ?Maintenance therapy with Revlimid:  Patient returned completed cycle 91 Medication Calendar, confirming dosing at dose level -3, Revlimid '5mg'$  every other day, Days 1-21. Patient notes that no doses were missed. Patient given printed appointment calendar with Cycle 92 treatment days marked, to document doses for the next treatment cycle, scheduled to begin today pending lab results.  ? ?Plan: Research nurse will call patient after review of today's lab results to notify if okay to start cycle 92 of Revlimid today as planned. Next lab appointment scheduled for 03/04/22 prior to cycle 93.  ?Reminded patient to call our office if any new questions/concerns prior to next visit. She verbalized understanding.  ? ?Adverse Event Log ?Study/Protocol: [facility] ECOG [id number] ?Maintenance Cycle 91  01/07/22-02/04/22 (end of Cycle 91= 02/03/22) ?Solicited &/or Reportable Events Grade Cycle # Comments  ?Anemia Grade 1 91    ?Hyperglycemia Grade 0    ?Lymphocyte count decreased Grade 0    ?Neutrophil count decreased Grade 2  91   ?Platelet count decreased Grade 0     ?Diarrhea Grade 0    ?Dyspnea Grade 0    ?Edema: limbs Grade 0    ?Fatigue  Grade 2  91 Moderate, occasionally limiting ADLs (unchanged from previous)  ?Fever Grade 0    ?Insomnia  Grade 0      ?Irritability Grade 0    ?Muscle weakness trunk Grade 0    ?Nausea Grade 0    ?Peripheral motor neuropathy Grade 0    ?Peripheral sensory neuropathy  Grade 2 91 Moderate symptoms, unchanged from previous  ?Rash acneiform Grade 0    ?Vomiting Grade 0    ? ?Non-reportable AEs (unsolicited, < Grade 3): ?Moderate pain (grade 2) ?Back pain ?2.  Decreased White Blood Cells (grade 1) ?3.  Stiffness in Back - "inflammation" (grade 1) ?4. Jaw Pain (grade 1) ? ? ?8:20 am CBC results reviewed by this research nurse and found to be within parameters for continued dosing at Maintenance phase Dose Level -3, Revlimid 5 mg every other day.  Cycle 92 to start today. Called patient and notified okay to start revlimid cycle today. She verbalized understanding.  ? ?Foye Spurling, BSN, RN, CCRP ?Clinical Research Nurse II ?02/04/2022 10:07 AM ? ? ?

## 2022-02-06 DIAGNOSIS — H25813 Combined forms of age-related cataract, bilateral: Secondary | ICD-10-CM | POA: Diagnosis not present

## 2022-02-06 DIAGNOSIS — H401111 Primary open-angle glaucoma, right eye, mild stage: Secondary | ICD-10-CM | POA: Diagnosis not present

## 2022-02-06 DIAGNOSIS — H35033 Hypertensive retinopathy, bilateral: Secondary | ICD-10-CM | POA: Diagnosis not present

## 2022-02-06 DIAGNOSIS — H524 Presbyopia: Secondary | ICD-10-CM | POA: Diagnosis not present

## 2022-02-06 DIAGNOSIS — H40022 Open angle with borderline findings, high risk, left eye: Secondary | ICD-10-CM | POA: Diagnosis not present

## 2022-02-12 ENCOUNTER — Encounter: Payer: Self-pay | Admitting: Hematology and Oncology

## 2022-02-12 NOTE — Telephone Encounter (Signed)
Spoke with [employer]. I was told the patient was just approved for [facility] last Monday, February 10, 2022. There should be a letter coming in the mail and the patient should be receiving a call soon from the pharmacy with a tracking number. If the patient doesn't hear from them soon, Gean Birchwood has asked for the patient to call the pharmacy, [facility], and their number is [phone].  ?

## 2022-02-23 ENCOUNTER — Other Ambulatory Visit: Payer: Self-pay | Admitting: Hematology and Oncology

## 2022-02-23 DIAGNOSIS — C9001 Multiple myeloma in remission: Secondary | ICD-10-CM

## 2022-02-24 ENCOUNTER — Encounter: Payer: Self-pay | Admitting: Hematology and Oncology

## 2022-02-24 NOTE — Telephone Encounter (Signed)
Pls refill electronically °

## 2022-02-27 ENCOUNTER — Other Ambulatory Visit: Payer: Self-pay | Admitting: Hematology and Oncology

## 2022-03-04 ENCOUNTER — Other Ambulatory Visit: Payer: Self-pay

## 2022-03-04 ENCOUNTER — Encounter: Payer: Self-pay | Admitting: *Deleted

## 2022-03-04 ENCOUNTER — Inpatient Hospital Stay: Payer: Medicare Other | Attending: Hematology and Oncology

## 2022-03-04 DIAGNOSIS — C9001 Multiple myeloma in remission: Secondary | ICD-10-CM

## 2022-03-04 DIAGNOSIS — Z006 Encounter for examination for normal comparison and control in clinical research program: Secondary | ICD-10-CM | POA: Diagnosis present

## 2022-03-04 DIAGNOSIS — Z79899 Other long term (current) drug therapy: Secondary | ICD-10-CM | POA: Insufficient documentation

## 2022-03-04 DIAGNOSIS — R946 Abnormal results of thyroid function studies: Secondary | ICD-10-CM | POA: Diagnosis present

## 2022-03-04 LAB — CBC WITH DIFFERENTIAL (CANCER CENTER ONLY)
Abs Immature Granulocytes: 0 10*3/uL (ref 0.00–0.07)
Basophils Absolute: 0 10*3/uL (ref 0.0–0.1)
Basophils Relative: 1 %
Eosinophils Absolute: 0.1 10*3/uL (ref 0.0–0.5)
Eosinophils Relative: 3 %
HCT: 36.8 % (ref 36.0–46.0)
Hemoglobin: 11.5 g/dL — ABNORMAL LOW (ref 12.0–15.0)
Immature Granulocytes: 0 %
Lymphocytes Relative: 57 %
Lymphs Abs: 2.4 10*3/uL (ref 0.7–4.0)
MCH: 22.9 pg — ABNORMAL LOW (ref 26.0–34.0)
MCHC: 31.3 g/dL (ref 30.0–36.0)
MCV: 73.2 fL — ABNORMAL LOW (ref 80.0–100.0)
Monocytes Absolute: 0.5 10*3/uL (ref 0.1–1.0)
Monocytes Relative: 12 %
Neutro Abs: 1.1 10*3/uL — ABNORMAL LOW (ref 1.7–7.7)
Neutrophils Relative %: 27 %
Platelet Count: 196 10*3/uL (ref 150–400)
RBC: 5.03 MIL/uL (ref 3.87–5.11)
RDW: 15.4 % (ref 11.5–15.5)
WBC Count: 4.2 10*3/uL (ref 4.0–10.5)
nRBC: 0 % (ref 0.0–0.2)

## 2022-03-04 MED ORDER — LENALIDOMIDE 5 MG PO CAPS: ORAL_CAPSULE | ORAL | 0 refills | Status: DC

## 2022-03-04 NOTE — Research (Signed)
Research - [facility] ECOG-ACRIN [id number] Maintenance Cycle 93, Day 1 ? ?Patient into clinic unaccompanied today for evaluation prior to beginning treatment Cycle 93 of revlimid.  ? ?Labs: CBC drawn per protocol.  ? ?Adverse Events Since Last Visit (in the past month): Patient reports ongoing fatigue (grade 2) not limiting self-care. 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 She had several days with diarrhea (grade 1) less than 4 stools over her baseline. Almost daily moderate back ache (grade 2) is ongoing. Patient reports insomnia (grade 2) on several occasions and took trazodone 3 times this past month.Patient reports a sore throat one day which was mild and resolved on it's own without any intervention (grade 1).   ?Patient continues to exercise regularly, swimming laps in the pool as well as riding the stationary bike on alternate days.   ?See AE table below. ? ?Maintenance therapy with Revlimid:  Patient returned completed cycle 92 Medication Calendar, confirming dosing at dose level -3, Revlimid '5mg'$  every other day, Days 1-21. Patient notes that no doses were missed. Patient given printed appointment calendar with Cycle 93 treatment days marked, to document doses for the next treatment cycle, scheduled to begin today pending lab results.  ? ?Plan: Research nurse will call patient after review of today's lab results to notify if okay to start cycle 93 of Revlimid today as planned. Next lab appointment scheduled in 3 weeks (03/25/22) to draw labs prior to next MD visit in 4 weeks (04/01/22).  ?Reminded patient to call our office if any new questions/concerns prior to next visit. She verbalized understanding.  ? ?Adverse Event Log ?Study/Protocol: [facility] ECOG [id number] ?Maintenance Cycle 92 02/04/22-03/04/22 (end of Cycle 92= 4/3//23) ?Solicited &/or Reportable Events Grade Cycle # Comments  ?Anemia Grade 1 92    ?Hyperglycemia Grade 0    ?Lymphocyte count decreased Grade 0    ?Neutrophil count  decreased Grade 2  92   ?Platelet count decreased Grade 0     ?Diarrhea Grade 1 92   ?Dyspnea Grade 0    ?Edema: limbs Grade 0    ?Fatigue  Grade 2  92 Moderate, occasionally limiting ADLs (unchanged from previous)  ?Fever Grade 0    ?Insomnia  Grade 0      ?Irritability Grade 0    ?Muscle weakness trunk Grade 0    ?Nausea Grade 0    ?Peripheral motor neuropathy Grade 0    ?Peripheral sensory neuropathy  Grade 2 92 Moderate symptoms, unchanged from previous  ?Rash acneiform Grade 0    ?Vomiting Grade 0    ? ?Non-reportable AEs (unsolicited, < Grade 3): ?Moderate pain (grade 2) ?Back pain ?2.  Sore Throat (grade 1) ? ? ?8:05 am CBC results reviewed by this research nurse and found to be within parameters for continued dosing at Maintenance phase Dose Level -3, Revlimid 5 mg every other day.  Cycle 93 to start today. Called patient and notified okay to start revlimid cycle today. She verbalized understanding.  ? ?Foye Spurling, BSN, RN, CCRP ?Clinical Research Nurse II ?03/04/2022 8:15 AM ? ? ?

## 2022-03-22 ENCOUNTER — Other Ambulatory Visit: Payer: Self-pay | Admitting: Hematology and Oncology

## 2022-03-22 DIAGNOSIS — C9001 Multiple myeloma in remission: Secondary | ICD-10-CM

## 2022-03-24 ENCOUNTER — Encounter: Payer: Self-pay | Admitting: Hematology and Oncology

## 2022-03-24 NOTE — Telephone Encounter (Signed)
-  pls refill electronically ?

## 2022-03-25 ENCOUNTER — Inpatient Hospital Stay: Payer: Medicare Other

## 2022-03-25 ENCOUNTER — Other Ambulatory Visit: Payer: Self-pay

## 2022-03-25 ENCOUNTER — Telehealth: Payer: Self-pay | Admitting: *Deleted

## 2022-03-25 DIAGNOSIS — C9001 Multiple myeloma in remission: Secondary | ICD-10-CM

## 2022-03-25 DIAGNOSIS — Z006 Encounter for examination for normal comparison and control in clinical research program: Secondary | ICD-10-CM | POA: Diagnosis not present

## 2022-03-25 LAB — CBC WITH DIFFERENTIAL (CANCER CENTER ONLY)
Abs Immature Granulocytes: 0 10*3/uL (ref 0.00–0.07)
Basophils Absolute: 0 10*3/uL (ref 0.0–0.1)
Basophils Relative: 1 %
Eosinophils Absolute: 0.2 10*3/uL (ref 0.0–0.5)
Eosinophils Relative: 6 %
HCT: 38.3 % (ref 36.0–46.0)
Hemoglobin: 11.6 g/dL — ABNORMAL LOW (ref 12.0–15.0)
Immature Granulocytes: 0 %
Lymphocytes Relative: 47 %
Lymphs Abs: 1.4 10*3/uL (ref 0.7–4.0)
MCH: 22.7 pg — ABNORMAL LOW (ref 26.0–34.0)
MCHC: 30.3 g/dL (ref 30.0–36.0)
MCV: 74.8 fL — ABNORMAL LOW (ref 80.0–100.0)
Monocytes Absolute: 0.4 10*3/uL (ref 0.1–1.0)
Monocytes Relative: 14 %
Neutro Abs: 0.9 10*3/uL — ABNORMAL LOW (ref 1.7–7.7)
Neutrophils Relative %: 32 %
Platelet Count: 194 10*3/uL (ref 150–400)
RBC: 5.12 MIL/uL — ABNORMAL HIGH (ref 3.87–5.11)
RDW: 15.7 % — ABNORMAL HIGH (ref 11.5–15.5)
WBC Count: 2.9 10*3/uL — ABNORMAL LOW (ref 4.0–10.5)
nRBC: 0 % (ref 0.0–0.2)

## 2022-03-25 LAB — CMP (CANCER CENTER ONLY)
ALT: 22 U/L (ref 0–44)
AST: 20 U/L (ref 15–41)
Albumin: 3.7 g/dL (ref 3.5–5.0)
Alkaline Phosphatase: 48 U/L (ref 38–126)
Anion gap: 7 (ref 5–15)
BUN: 11 mg/dL (ref 8–23)
CO2: 29 mmol/L (ref 22–32)
Calcium: 9.1 mg/dL (ref 8.9–10.3)
Chloride: 105 mmol/L (ref 98–111)
Creatinine: 0.9 mg/dL (ref 0.44–1.00)
GFR, Estimated: >60 mL/min (ref >60)
Glucose, Bld: 86 mg/dL (ref 70–99)
Potassium: 3.7 mmol/L (ref 3.5–5.1)
Sodium: 141 mmol/L (ref 135–145)
Total Bilirubin: 0.7 mg/dL (ref 0.3–1.2)
Total Protein: 7.3 g/dL (ref 6.5–8.1)

## 2022-03-25 LAB — LACTATE DEHYDROGENASE: LDH: 154 U/L (ref 98–192)

## 2022-03-25 LAB — TSH: TSH: 0.662 u[IU]/mL (ref 0.350–4.500)

## 2022-03-25 NOTE — Telephone Encounter (Signed)
[id number] Study:  Notified patient of ANC 0.9 today and we will need to repeat CBC next week before she sees Dr. Alvy Bimler. ANC must be 1.0 or higher for patient to start next cycle of Revlimid per study.  Lab appt request to be added at 7:30 am on 04/01/22 sent to scheduling.  Patient verbalized understanding.  ?Foye Spurling, BSN, RN, CCRP ?Clinical Research Nurse II ?03/25/2022 12:29 PM ? ?

## 2022-03-26 LAB — KAPPA/LAMBDA LIGHT CHAINS
Kappa free light chain: 29.8 mg/L — ABNORMAL HIGH (ref 3.3–19.4)
Kappa, lambda light chain ratio: 1.75 — ABNORMAL HIGH (ref 0.26–1.65)
Lambda free light chains: 17 mg/L (ref 5.7–26.3)

## 2022-03-27 LAB — MULTIPLE MYELOMA PANEL, SERUM
Albumin SerPl Elph-Mcnc: 3.4 g/dL (ref 2.9–4.4)
Albumin/Glob SerPl: 1.1 (ref 0.7–1.7)
Alpha 1: 0.2 g/dL (ref 0.0–0.4)
Alpha2 Glob SerPl Elph-Mcnc: 0.5 g/dL (ref 0.4–1.0)
B-Globulin SerPl Elph-Mcnc: 0.9 g/dL (ref 0.7–1.3)
Gamma Glob SerPl Elph-Mcnc: 1.6 g/dL (ref 0.4–1.8)
Globulin, Total: 3.2 g/dL (ref 2.2–3.9)
IgA: 118 mg/dL (ref 87–352)
IgG (Immunoglobin G), Serum: 1597 mg/dL (ref 586–1602)
IgM (Immunoglobulin M), Srm: 53 mg/dL (ref 26–217)
Total Protein ELP: 6.6 g/dL (ref 6.0–8.5)

## 2022-04-01 ENCOUNTER — Inpatient Hospital Stay: Payer: Medicare Other | Attending: Hematology and Oncology | Admitting: Hematology and Oncology

## 2022-04-01 ENCOUNTER — Telehealth: Payer: Self-pay

## 2022-04-01 ENCOUNTER — Inpatient Hospital Stay: Payer: Medicare Other

## 2022-04-01 ENCOUNTER — Other Ambulatory Visit: Payer: Self-pay | Admitting: Hematology and Oncology

## 2022-04-01 ENCOUNTER — Encounter: Payer: Self-pay | Admitting: Hematology and Oncology

## 2022-04-01 ENCOUNTER — Encounter: Payer: Self-pay | Admitting: *Deleted

## 2022-04-01 ENCOUNTER — Other Ambulatory Visit: Payer: Self-pay

## 2022-04-01 VITALS — BP 167/74 | HR 49 | Temp 98.0°F | Resp 18 | Ht 67.0 in | Wt 157.4 lb

## 2022-04-01 DIAGNOSIS — M62838 Other muscle spasm: Secondary | ICD-10-CM | POA: Diagnosis not present

## 2022-04-01 DIAGNOSIS — Z79899 Other long term (current) drug therapy: Secondary | ICD-10-CM | POA: Diagnosis not present

## 2022-04-01 DIAGNOSIS — T451X5A Adverse effect of antineoplastic and immunosuppressive drugs, initial encounter: Secondary | ICD-10-CM

## 2022-04-01 DIAGNOSIS — D701 Agranulocytosis secondary to cancer chemotherapy: Secondary | ICD-10-CM

## 2022-04-01 DIAGNOSIS — Z9221 Personal history of antineoplastic chemotherapy: Secondary | ICD-10-CM | POA: Diagnosis not present

## 2022-04-01 DIAGNOSIS — C9001 Multiple myeloma in remission: Secondary | ICD-10-CM | POA: Diagnosis not present

## 2022-04-01 DIAGNOSIS — Z8739 Personal history of other diseases of the musculoskeletal system and connective tissue: Secondary | ICD-10-CM | POA: Diagnosis not present

## 2022-04-01 DIAGNOSIS — Z006 Encounter for examination for normal comparison and control in clinical research program: Secondary | ICD-10-CM | POA: Diagnosis not present

## 2022-04-01 LAB — CBC WITH DIFFERENTIAL (CANCER CENTER ONLY)
Abs Immature Granulocytes: 0 10*3/uL (ref 0.00–0.07)
Basophils Absolute: 0 10*3/uL (ref 0.0–0.1)
Basophils Relative: 1 %
Eosinophils Absolute: 0.1 10*3/uL (ref 0.0–0.5)
Eosinophils Relative: 3 %
HCT: 36.6 % (ref 36.0–46.0)
Hemoglobin: 11.4 g/dL — ABNORMAL LOW (ref 12.0–15.0)
Immature Granulocytes: 0 %
Lymphocytes Relative: 51 %
Lymphs Abs: 1.6 10*3/uL (ref 0.7–4.0)
MCH: 22.7 pg — ABNORMAL LOW (ref 26.0–34.0)
MCHC: 31.1 g/dL (ref 30.0–36.0)
MCV: 72.9 fL — ABNORMAL LOW (ref 80.0–100.0)
Monocytes Absolute: 0.4 10*3/uL (ref 0.1–1.0)
Monocytes Relative: 13 %
Neutro Abs: 1 10*3/uL — ABNORMAL LOW (ref 1.7–7.7)
Neutrophils Relative %: 32 %
Platelet Count: 191 10*3/uL (ref 150–400)
RBC: 5.02 MIL/uL (ref 3.87–5.11)
RDW: 15.4 % (ref 11.5–15.5)
WBC Count: 3 10*3/uL — ABNORMAL LOW (ref 4.0–10.5)
nRBC: 0 % (ref 0.0–0.2)

## 2022-04-01 MED ORDER — LENALIDOMIDE 5 MG PO CAPS: ORAL_CAPSULE | ORAL | 0 refills | Status: DC

## 2022-04-01 MED ORDER — METHOCARBAMOL 500 MG PO TABS: 500.0000 mg | ORAL_TABLET | Freq: Three times a day (TID) | ORAL | 1 refills | Status: DC | PRN

## 2022-04-01 NOTE — Progress Notes (Signed)
[place] ?OFFICE PROGRESS NOTE ? ?Patient Care Team: ?Wenda Low, MD as PCP - General (Internal Medicine) ?Heath Lark, MD as Consulting Physician (Hematology and Oncology) ? ?ASSESSMENT & PLAN:  ?Multiple myeloma in remission ([place]) ?Her recent myeloma panel showed that she is in remission ?Her slightly elevated free light chains and LDH is unrelated ?The patient is comfortable to remain on Revlimid indefinitely. 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 ? ?Chemotherapy induced neutropenia (H[facility]) ?This is due to her treatment ?She is not symptomatic ?Observe ? ?Muscle spasm ?She has intermittent muscle spasm and related to treatment ?I refill prescription for her muscle relaxant today ? ?Orders Placed This Encounter  ?Procedures  ? DG Bone Survey Met  ?  Standing Status:   Future  ?  Standing Expiration Date:   04/02/2023  ?  Order Specific Question:   Reason for exam:  ?  Answer:   myeloma staging  ?  Order Specific Question:   Preferred imaging location?  ?  Answer:   [facility]  ? ? ?All questions were answered. The patient knows to call the clinic with any problems, questions or concerns. ?The total time spent in the appointment was 20 minutes encounter with patients including review of chart and various tests results, discussions about plan of care and coordination of care plan ?  ?Heath Lark, MD ?04/01/2022 8:58 AM ? ?INTERVAL HISTORY: ?Please see below for problem oriented charting. ?she returns for treatment follow-up on research protocol on maintenance Revlimid for multiple myeloma ?She is doing well ?No recent infection ?She continues to exercise on a regular basis ?She has intermittent migraines and muscle spasm, unrelated to treatment ? ?REVIEW OF SYSTEMS:   ?Constitutional: Denies fevers, chills or abnormal weight  loss ?Eyes: Denies blurriness of vision ?Ears, nose, mouth, throat, and face: Denies mucositis or sore throat ?Respiratory: Denies cough, dyspnea or wheezes ?Cardiovascular: Denies palpitation, chest discomfort or lower extremity swelling ?Gastrointestinal:  Denies nausea, heartburn or change in bowel habits ?Skin: Denies abnormal skin rashes ?Lymphatics: Denies new lymphadenopathy or easy bruising ?Neurological:Denies numbness, tingling or new weaknesses ?Behavioral/Psych: Mood is stable, no new changes  ?All other systems were reviewed with the patient and are negative. ? ?I have reviewed the past medical history, past surgical history, social history and family history with the patient and they are unchanged from previous note. ? ?ALLERGIES:  is allergic to metformin. ? ?MEDICATIONS:  ?Current Outpatient Medications  ?Medication Sig Dispense Refill  ? aspirin 325 MG tablet Take 325 mg by mouth daily.     ? Cholecalciferol (VITAMIN D3) 2000 UNITS TABS Take 2,000 Units by mouth daily.    ? dorzolamide-timolol (COSOPT) 22.3-6.8 MG/ML ophthalmic solution Place 1 drop into both eyes 2 (two) times daily.    ? Galcanezumab-gnlm (EMGALITY) 120 MG/ML SOAJ Inject 120 mg into the skin every 30 (thirty) days. 2 mL 0  ? hydrochlorothiazide (HYDRODIURIL) 25 MG tablet Take 25 mg by mouth every evening.     ? lenalidomide (REVLIMID) 5 MG capsule TAKE 1 CAPSULE BY MOUTH EVERY  OTHER DAY FOR 21 DAYS ON THEN 7  DAYS OFF 11 capsule 0  ? levothyroxine (SYNTHROID, LEVOTHROID) 75 MCG tablet Take 75 mcg by mouth daily before breakfast.    ? methocarbamol (ROBAXIN) 500 MG tablet Take 1 tablet (500 mg total) by  mouth every 8 (eight) hours as needed for muscle spasms. 90 tablet 1  ? Multiple Vitamins-Minerals (CENTRUM SILVER PO) Take 1 tablet by mouth daily.     ? polyethylene glycol (MIRALAX / GLYCOLAX) packet Take 17 g by mouth daily as needed for mild constipation.     ? RESTASIS 0.05 % ophthalmic emulsion PLACE 1 DROP IN BOTH EYES IN  THE EVENING  4  ? rosuvastatin (CRESTOR) 5 MG tablet Take 5 mg by mouth daily at 2 PM.    ? sitaGLIPtin (JANUVIA) 50 MG tablet Take 50 mg by mouth daily.    ? traMADol (ULTRAM) 50 MG tablet Take by mouth every 6 (six) hours as needed.    ? traZODone (DESYREL) 50 MG tablet TAKE 1 TABLET (50 MG TOTAL) BY MOUTH AT BEDTIME. 90 tablet 3  ? venlafaxine XR (EFFEXOR-XR) 150 MG 24 hr capsule Take 1 capsule (150 mg total) by mouth daily with breakfast. 90 capsule 3  ? verapamil (VERELAN PM) 120 MG 24 hr capsule Take 1 capsule (120 mg total) by mouth daily. 90 capsule 3  ? ?No current facility-administered medications for this visit.  ? ?Facility-Administered Medications Ordered in Other Visits  ?Medication Dose Route Frequency Provider Last Rate Last Admin  ? gadopentetate dimeglumine (MAGNEVIST) injection 15 mL  15 mL Intravenous Once PRN Melvenia Beam, MD      ? ? ?SUMMARY OF ONCOLOGIC HISTORY: ?Oncology History Overview Note  ?ISS stage 1 IgG lambda subtype (serum albumin 3.6, Beta2 microglobulin 2.32) ?[place] Stage 1 ? ?  ?Multiple myeloma in remission [facility])  ?10/10/2013 Imaging  ? Skeletal survery was negative ? ?  ?11/09/2013 Bone Marrow Biopsy  ? BM biopsy confirmed myeloma, 76% involved, IgG lambda subtype ? ?  ?12/06/2013 - 08/29/2014 Chemotherapy  ? Sh received chemo with revlimid, Velcade, Dexamethasone and Zometa. Patient particpated in clinical research [facility] [id number] ? ?  ?02/23/2014 Bone Marrow Biopsy  ? Repeat bone marrow biopsy showed 5% involvement ? ?  ?03/31/2014 Adverse Reaction  ? Zometa was discontinued due to osteonecrosis of the jaw. ? ?  ?05/05/2014 Imaging  ? Imaging study of the neck showed no explanation that could cause right neck pain. She is noted to have incidental left upper lung nodule. Plan to repeat imaging study in 3 months. ? ?  ?09/06/2014 Imaging  ? Bone survey showed no evidence of fracture ? ?  ?09/14/2014 Bone Marrow Biopsy  ? Bone marrow biopsy showed 8% residual plasma cells by manual  count but none on the biopsy specimen ? ?  ?09/26/2014 -  Chemotherapy  ? She is started on cycle 1 of maintenance Revlimid ? ?  ?01/31/2015 Imaging  ?  chest x-ray showed pneumonia. Treatment was placed on hold. ? ?  ?05/03/2015 Bone Marrow Biopsy  ? Accession: [id number] repeat bone marrow aspirate and biopsy show 5% residual plasma cells ? ?  ?10/14/2016 Bone Marrow Biopsy  ? Bone marrow biopsy showed the plasma cells represent 4% of all cells with lack of large aggregates or sheets. To assess the plasma cell clonality, immunohistochemical stains is performed and it lack clonality. Normal cytogenetics and FISH ? ?  ?01/09/2017 Imaging  ? CT chest showed ground-glass 1.5 cm apical left upper lobe pulmonary nodule. Initial follow-up with CT at 6-12 months is recommended to confirm ?persistence. If persistent, repeat CT is recommended every 2 years until 5 years of stability has been established. This recommendation follows the consensus statement: Guidelines for Management of Incidental Pulmonary  Nodules Detected on CT Images: From the Fleischner Society 2017; Radiology 2017; 284:228-243. 2. Mild patchy ground-glass opacities in the right upper lobe, probably inflammatory, which can also be reassessed on follow-up chest CT performed for the above dominant ground-glass nodule. 3. Solitary 3 mm right lower lobe solid pulmonary nodule, which can also be reassessed on follow-up chest CT performed for the above dominant ground-glass nodule. 4. No thoracic adenopathy. ?5. Aortic atherosclerosis.  Two-vessel coronary atherosclerosis. ? ?  ?06/30/2017 Imaging  ? 1. No interval change in the 11 x 13 mm ground-glass nodule left upper lobe. Given the nearly 6 months of imaging stability, repeat CT is recommended every 2 years until 5 years of stability has been established. This recommendation follows the consensus statement: Guidelines for Management of Incidental Pulmonary Nodules Detected on CT Images: From the Fleischner Society  2017; Radiology 2017; 284:228-243. ?2. Interval resolution of the tiny patchy ground-glass nodules right upper lobe, likely infectious/inflammatory etiology. ?3. Stable 3 mm right lower lobe pulmonary nodul

## 2022-04-01 NOTE — Research (Signed)
Research - [facility] ECOG-ACRIN [id number] Maintenance Cycle 94, Day 1 ? ?Patient into clinic unaccompanied today for evaluation prior to beginning treatment Cycle 94 of revlimid.  ? ?Labs w/myeloma panel: Obtained on 03/25/2022 within 7-day window per protocol, to allow adequate time for results prior to treatment. CBC repeated this morning due to [place] from last week was below parameters to start Revlimid today. Results from 03/25/22 and today's CBC were reviewed by Dr. Alvy Bimler and found results to be within parameters for continued dosing at Maintenance phase Dose Level -3, Revlimid 5 mg every other day for 21 days, every 28 days. 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 Dr. Alvy Bimler states the slightly elevated light chain ratio is not considered to be clinically significant and most likely due to patient not hydrated enough at the time of her lab draw.Per MD, bone marrow biopsy and aspirate would not be considered standard of care at this time. Dr. Alvy Bimler also states that 24 hr urine is not required to assess disease response so there is no need to have it done.  ? ?History and physical exam: See MD note dated today.    ? ?Adverse Events Since Last Visit (in the past month): Patient reports ongoing fatigue (grade 2) not limiting self-care. 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 Daily moderate back ache (grade 2) is ongoing. Patient also reported some neck pain and stiffness for a few days. She took methocarbamol for it (grade 2). Her insomnia (grade 2) is ongoing and patient takes trazodone PRN. Patient denies diarrhea since last study visit. She reports occasional stomach problems/pain which she describes as feeling like she has to have a bowel movement but can't. She has not taken anything for it and it does not interfere with her ability to eat or drink (grade 1).  Patient reports a non productive cough for 2 to 3 days. She did not take anything for it  and it resolved on it's own (grade 1). Patient reports generalized bone pain which is not new and it did not interfere with any of her activities, she did not take any medication for this (grade 1).  She experienced 2 migraine headaches this month for which she took tramadol and it helped (grade 2).   ?Patient continues to exercise regularly, swimming laps in the pool 3 days per week as well as riding the stationary bike 2 days per week.   ?See AE table below. ? ?Maintenance therapy with Revlimid: Based on lab results, AE review and history and physical by Dr. Alvy Bimler, patient meets criteria for continued treatment with Revlimid, which she is in agreement to do. Patient is aware that treatment will continue indefinitely, unless there is evidence of disease recurrence, adverse effects requiring treatment discontinuation, or patient/physician decision to discontinue therapy. ?Patient returned completed cycle 93 Medication Calendar, confirming dosing at dose level -3, Revlimid 68m every other day, Days 1-21. Patient notes that no doses were missed. Patient given printed appointment calendar with Cycle 94 treatment days marked, to document doses for the next treatment cycle, scheduled to begin today. ? ?Plan: Patient will continue with monthly clinic visits with lab. These are due on 04/29/22 and 05/27/22 for hematology and AE assessments prior to beginning subsequent treatment cycles. Myeloma panel and other labs, along with skeletal bone survey to be performed on 06/17/22 in advance of the next MD clinic visit at the end of Cycle 96, scheduled for 06/24/22. ?Reminded patient to call our office if  any new questions/concerns prior to next visit. She verbalized understanding.  ? ?Adverse Event Log ?Study/Protocol: [facility] ECOG [id number] ?Maintenance Cycles 91-93 01/07/22-04/01/22 (end of Cycle 93= 03/31/22) ?Solicited &/or Reportable Events Grade Attribution to ?lenalidomide Cycle # Comments  ?Anemia Grade 1 Definite 91, 92, 93     ?Hyperglycemia Grade 0 -    ?Lymphocyte count decreased Grade 0 -    ?Neutrophil count decreased Grade 2  Definite 91,92,93   ?Neutrophil count decreased Grade 3 Definite 93 Prior to week off Revlimid  ?Platelet count decreased Grade 0 -     ?Diarrhea Grade 1 Possible 92   ?Dyspnea Grade 0 -    ?Edema: limbs Grade 0 -    ?Fatigue  Grade 2  Probable 91, 92, 93 Moderate, occasionally limiting ADLs (unchanged from previous)  ?Fever Grade 0 -    ?Insomnia  Grade 2   Unlikely 92, 93 Trazodone PRN  ?Irritability Grade 0 -    ?Muscle weakness trunk Grade 0 -    ?Nausea Grade 0     ?Peripheral motor neuropathy Grade 0 -    ?Peripheral sensory neuropathy  Grade 2 Unlikely 91, 92, 93 Moderate symptoms, unchanged from previous  ?Rash acneiform Grade 0     ?Vomiting Grade 0 -    ?Hypertension Grade 3 Unlikely 93   ? ?Non-reportable AEs (unsolicited, < Grade 3): ?Moderate pain (grade 2) ?Back pain ?Decreased White Blood Cells (grade 1-2) ?Bone pain (grade 1) ?Cough non productive (grade 1) ?Neck pain/stiffness (grade 1) ?Stomach pain (grade 1) ?Migraines (grade 1)  ?Sore throat (grade 1) ?Stiffness in back (grade 1) ?Jaw Pain (grade 1) ? ?Foye Spurling, BSN, RN ?Clinical Research Nurse ?04/01/2022  ?

## 2022-04-01 NOTE — Assessment & Plan Note (Signed)
This is due to her treatment She is not symptomatic Observe 

## 2022-04-01 NOTE — Telephone Encounter (Signed)
-----   Message from Heath Lark, MD sent at 04/01/2022  8:08 AM EDT ----- ?Pls help schedule skeletal bone survey to be done on 7/18 ? ?

## 2022-04-01 NOTE — Assessment & Plan Note (Signed)
She has intermittent muscle spasm and related to treatment ?I refill prescription for her muscle relaxant today ?

## 2022-04-01 NOTE — Assessment & Plan Note (Signed)
Her recent myeloma panel showed that she is in remission ?Her slightly elevated free light chains and LDH is unrelated ?The patient is comfortable to remain on Revlimid indefinitely. 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 ?

## 2022-04-01 NOTE — Telephone Encounter (Signed)
Called and given appt details for 7/18 appt for skeletal bone survey at 9 am at [facility], arrive at [place]. Husband verbalized understanding. ?

## 2022-04-03 ENCOUNTER — Other Ambulatory Visit: Payer: Self-pay | Admitting: Nurse Practitioner

## 2022-04-03 ENCOUNTER — Ambulatory Visit
Admission: RE | Admit: 2022-04-03 | Discharge: 2022-04-03 | Disposition: A | Discharge status: Home / Self Care | Payer: Medicare Other | Source: Ambulatory Visit | Attending: Nurse Practitioner | Admitting: Nurse Practitioner

## 2022-04-03 ENCOUNTER — Other Ambulatory Visit: Payer: Self-pay | Admitting: Internal Medicine

## 2022-04-03 DIAGNOSIS — Z1231 Encounter for screening mammogram for malignant neoplasm of breast: Secondary | ICD-10-CM

## 2022-04-03 DIAGNOSIS — Z780 Asymptomatic menopausal state: Secondary | ICD-10-CM | POA: Diagnosis not present

## 2022-04-09 ENCOUNTER — Ambulatory Visit
Admission: RE | Admit: 2022-04-09 | Discharge: 2022-04-09 | Disposition: A | Discharge status: Home / Self Care | Payer: Medicare Other | Source: Ambulatory Visit | Attending: Internal Medicine | Admitting: Internal Medicine

## 2022-04-09 DIAGNOSIS — Z1231 Encounter for screening mammogram for malignant neoplasm of breast: Secondary | ICD-10-CM | POA: Diagnosis not present

## 2022-04-19 ENCOUNTER — Other Ambulatory Visit: Payer: Self-pay | Admitting: Hematology and Oncology

## 2022-04-19 DIAGNOSIS — C9001 Multiple myeloma in remission: Secondary | ICD-10-CM

## 2022-04-21 ENCOUNTER — Encounter: Payer: Self-pay | Admitting: Hematology and Oncology

## 2022-04-21 NOTE — Telephone Encounter (Signed)
Pls refill electronically °

## 2022-04-22 ENCOUNTER — Encounter: Payer: Self-pay | Admitting: Hematology and Oncology

## 2022-04-24 DIAGNOSIS — I700 Atherosclerosis of aorta: Secondary | ICD-10-CM | POA: Diagnosis not present

## 2022-04-24 DIAGNOSIS — E1169 Type 2 diabetes mellitus with other specified complication: Secondary | ICD-10-CM | POA: Diagnosis not present

## 2022-04-24 DIAGNOSIS — C9000 Multiple myeloma not having achieved remission: Secondary | ICD-10-CM | POA: Diagnosis not present

## 2022-04-24 DIAGNOSIS — G43909 Migraine, unspecified, not intractable, without status migrainosus: Secondary | ICD-10-CM | POA: Diagnosis not present

## 2022-04-24 DIAGNOSIS — E039 Hypothyroidism, unspecified: Secondary | ICD-10-CM | POA: Diagnosis not present

## 2022-04-24 DIAGNOSIS — I10 Essential (primary) hypertension: Secondary | ICD-10-CM | POA: Diagnosis not present

## 2022-04-24 DIAGNOSIS — R21 Rash and other nonspecific skin eruption: Secondary | ICD-10-CM | POA: Diagnosis not present

## 2022-04-29 ENCOUNTER — Encounter: Payer: Self-pay | Admitting: *Deleted

## 2022-04-29 ENCOUNTER — Other Ambulatory Visit: Payer: Self-pay

## 2022-04-29 ENCOUNTER — Inpatient Hospital Stay: Payer: Medicare Other

## 2022-04-29 DIAGNOSIS — C9001 Multiple myeloma in remission: Secondary | ICD-10-CM

## 2022-04-29 DIAGNOSIS — Z006 Encounter for examination for normal comparison and control in clinical research program: Secondary | ICD-10-CM | POA: Diagnosis not present

## 2022-04-29 LAB — CBC WITH DIFFERENTIAL (CANCER CENTER ONLY)
Abs Immature Granulocytes: 0 10*3/uL (ref 0.00–0.07)
Basophils Absolute: 0 10*3/uL (ref 0.0–0.1)
Basophils Relative: 1 %
Eosinophils Absolute: 0.1 10*3/uL (ref 0.0–0.5)
Eosinophils Relative: 2 %
HCT: 37.6 % (ref 36.0–46.0)
Hemoglobin: 12 g/dL (ref 12.0–15.0)
Immature Granulocytes: 0 %
Lymphocytes Relative: 57 %
Lymphs Abs: 2.4 10*3/uL (ref 0.7–4.0)
MCH: 23.3 pg — ABNORMAL LOW (ref 26.0–34.0)
MCHC: 31.9 g/dL (ref 30.0–36.0)
MCV: 73 fL — ABNORMAL LOW (ref 80.0–100.0)
Monocytes Absolute: 0.5 10*3/uL (ref 0.1–1.0)
Monocytes Relative: 12 %
Neutro Abs: 1.1 10*3/uL — ABNORMAL LOW (ref 1.7–7.7)
Neutrophils Relative %: 28 %
Platelet Count: 198 10*3/uL (ref 150–400)
RBC: 5.15 MIL/uL — ABNORMAL HIGH (ref 3.87–5.11)
RDW: 15.4 % (ref 11.5–15.5)
WBC Count: 4.1 10*3/uL (ref 4.0–10.5)
nRBC: 0 % (ref 0.0–0.2)

## 2022-04-29 MED ORDER — LENALIDOMIDE 5 MG PO CAPS: ORAL_CAPSULE | ORAL | 0 refills | Status: DC

## 2022-04-29 NOTE — Research (Signed)
Research - [facility] ECOG-ACRIN [id number] Maintenance Cycle 95, Day 1  Patient into clinic unaccompanied today for evaluation prior to beginning treatment Cycle 95 of revlimid.   Labs: CBC drawn per protocol.   Adverse Events Since Last Visit (in the past month): Patient reports ongoing fatigue (grade 2) not limiting self-care. 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 She denies insomnia and diarrhea since last study visit. Daily moderate back ache (grade 2) is ongoing. Patient reports occasional neck pain (grade 2) and she takes methocarbamol PRN.  She experienced 1 migraine headache (grade 2) this month for which she took tramadol.   Patient reports a rash under both breasts she noticed on 5/21.  She used some triamcinolone cream she had left over from a previous different rash but it did not help. She saw her PCP on 5/25 and was diagnosed as having a fungal infection (grade 2) causing the rash for which she was prescribed Ketoconazole cream. She started the cream on 5/26 twice daily and says it is helping but the rash is not gone completely yet. Patient showed this nurse pictures of the rash under her breast on her phone and it appears flat and reddened. Patient states it may have started due to sweating under her breasts. She was instructed to keep area as dry as possible and use the ketoconazole cream twice daily. Dr. Alvy Bimler states this rash/fungal infection is not related to Revlimid.  Patient continues to exercise regularly, swimming laps in the pool 3 days per week as well as riding the stationary bike 2 days per week.   See AE table below.  Maintenance therapy with Revlimid:  Patient returned completed cycle 94 Medication Calendar, confirming dosing at dose level -3, Revlimid '5mg'$  every other day, Days 1-21. Patient notes that no doses were missed. Patient given printed appointment calendar with Cycle 95 treatment days marked, to document doses for the next treatment  cycle, scheduled to begin today pending lab results.   Plan: Research nurse will call patient after review of today's lab results to notify if okay to start cycle 95 of Revlimid today as planned. Next lab appointment scheduled in 4 weeks (05/27/22) for CBC prior to next cycle of Revlimid.  Reminded patient to call our office if any new questions/concerns prior to next visit. She verbalized understanding.   Maintenance Cycles 94 04/01/22- 04/29/22  (end of Cycle 65= 5/37/48) Solicited &/or Reportable Events Grade Attribution to lenalidomide Cycle # Comments  Anemia Grade 0 -     Hyperglycemia Grade 0 -      Lymphocyte count decreased Grade 0 -      Neutrophil count decreased Grade 2  Definite 94    Platelet count decreased Grade 0 -      Diarrhea Grade 0 -     Dyspnea Grade 0 -      Edema: limbs Grade 0 -      Fatigue  Grade 2  Probable 94 Moderate, occasionally limiting ADLs (unchanged from previous)  Fever Grade 0 -      Insomnia  Grade 0  -    Irritability Grade 0 -      Muscle weakness trunk Grade 0 -      Nausea Grade 0 -       Peripheral motor neuropathy Grade 0 -      Peripheral sensory neuropathy  Grade 2 Unlikely 94 Moderate symptoms, unchanged from previous  Rash acneiform Grade 0  Vomiting Grade 0 -      Hypertension Grade 3 Unlikely ?  ongoing? Recheck next MD visit    Non-reportable AEs (unsolicited, < Grade 3): Moderate pain (grade 2) Back pain Neck pain (grade 2) Migraines (grade 2)  Fungal Infection/rash under breasts (grade 2)  10:05 am CBC results reviewed by this research nurse and found to be within parameters for continued dosing at Maintenance phase Dose Level -3, Revlimid 5 mg every other day.  Dr. Alvy Bimler states that rash is not related to revlimid. Cycle 95 to start today. Called patient and notified okay to start revlimid cycle today. She verbalized understanding.   Foye Spurling, BSN, RN, [place] Nurse II 04/29/2022

## 2022-05-13 DIAGNOSIS — R21 Rash and other nonspecific skin eruption: Secondary | ICD-10-CM | POA: Diagnosis not present

## 2022-05-13 DIAGNOSIS — B372 Candidiasis of skin and nail: Secondary | ICD-10-CM | POA: Diagnosis not present

## 2022-05-19 ENCOUNTER — Other Ambulatory Visit: Payer: Self-pay | Admitting: Hematology and Oncology

## 2022-05-19 DIAGNOSIS — C9001 Multiple myeloma in remission: Secondary | ICD-10-CM

## 2022-05-19 NOTE — Telephone Encounter (Signed)
Pls refill electronically °

## 2022-05-23 DIAGNOSIS — K219 Gastro-esophageal reflux disease without esophagitis: Secondary | ICD-10-CM | POA: Diagnosis not present

## 2022-05-23 DIAGNOSIS — E1169 Type 2 diabetes mellitus with other specified complication: Secondary | ICD-10-CM | POA: Diagnosis not present

## 2022-05-23 DIAGNOSIS — I10 Essential (primary) hypertension: Secondary | ICD-10-CM | POA: Diagnosis not present

## 2022-05-23 DIAGNOSIS — G43909 Migraine, unspecified, not intractable, without status migrainosus: Secondary | ICD-10-CM | POA: Diagnosis not present

## 2022-05-23 DIAGNOSIS — E039 Hypothyroidism, unspecified: Secondary | ICD-10-CM | POA: Diagnosis not present

## 2022-05-23 DIAGNOSIS — B372 Candidiasis of skin and nail: Secondary | ICD-10-CM | POA: Diagnosis not present

## 2022-05-27 ENCOUNTER — Encounter: Payer: Self-pay | Admitting: *Deleted

## 2022-05-27 ENCOUNTER — Other Ambulatory Visit: Payer: Self-pay

## 2022-05-27 ENCOUNTER — Inpatient Hospital Stay: Payer: Medicare Other | Attending: Hematology and Oncology

## 2022-05-27 DIAGNOSIS — Z006 Encounter for examination for normal comparison and control in clinical research program: Secondary | ICD-10-CM | POA: Diagnosis present

## 2022-05-27 DIAGNOSIS — C9001 Multiple myeloma in remission: Secondary | ICD-10-CM

## 2022-05-27 LAB — CBC WITH DIFFERENTIAL (CANCER CENTER ONLY)
Abs Immature Granulocytes: 0.01 10*3/uL (ref 0.00–0.07)
Basophils Absolute: 0 10*3/uL (ref 0.0–0.1)
Basophils Relative: 1 %
Eosinophils Absolute: 0.1 10*3/uL (ref 0.0–0.5)
Eosinophils Relative: 3 %
HCT: 36.6 % (ref 36.0–46.0)
Hemoglobin: 11.5 g/dL — ABNORMAL LOW (ref 12.0–15.0)
Immature Granulocytes: 0 %
Lymphocytes Relative: 56 %
Lymphs Abs: 2 10*3/uL (ref 0.7–4.0)
MCH: 23.1 pg — ABNORMAL LOW (ref 26.0–34.0)
MCHC: 31.4 g/dL (ref 30.0–36.0)
MCV: 73.5 fL — ABNORMAL LOW (ref 80.0–100.0)
Monocytes Absolute: 0.5 10*3/uL (ref 0.1–1.0)
Monocytes Relative: 15 %
Neutro Abs: 0.9 10*3/uL — ABNORMAL LOW (ref 1.7–7.7)
Neutrophils Relative %: 25 %
Platelet Count: 168 10*3/uL (ref 150–400)
RBC: 4.98 MIL/uL (ref 3.87–5.11)
RDW: 15 % (ref 11.5–15.5)
WBC Count: 3.6 10*3/uL — ABNORMAL LOW (ref 4.0–10.5)
nRBC: 0 % (ref 0.0–0.2)

## 2022-06-02 ENCOUNTER — Encounter: Payer: Self-pay | Admitting: *Deleted

## 2022-06-02 ENCOUNTER — Inpatient Hospital Stay: Payer: Medicare Other | Attending: Hematology and Oncology

## 2022-06-02 ENCOUNTER — Other Ambulatory Visit: Payer: Self-pay

## 2022-06-02 DIAGNOSIS — Z79899 Other long term (current) drug therapy: Secondary | ICD-10-CM | POA: Diagnosis not present

## 2022-06-02 DIAGNOSIS — C9001 Multiple myeloma in remission: Secondary | ICD-10-CM | POA: Diagnosis not present

## 2022-06-02 DIAGNOSIS — D701 Agranulocytosis secondary to cancer chemotherapy: Secondary | ICD-10-CM | POA: Insufficient documentation

## 2022-06-02 LAB — CBC WITH DIFFERENTIAL (CANCER CENTER ONLY)
Abs Immature Granulocytes: 0 10*3/uL (ref 0.00–0.07)
Basophils Absolute: 0 10*3/uL (ref 0.0–0.1)
Basophils Relative: 1 %
Eosinophils Absolute: 0.1 10*3/uL (ref 0.0–0.5)
Eosinophils Relative: 3 %
HCT: 36.3 % (ref 36.0–46.0)
Hemoglobin: 11.4 g/dL — ABNORMAL LOW (ref 12.0–15.0)
Immature Granulocytes: 0 %
Lymphocytes Relative: 53 %
Lymphs Abs: 1.7 10*3/uL (ref 0.7–4.0)
MCH: 23.2 pg — ABNORMAL LOW (ref 26.0–34.0)
MCHC: 31.4 g/dL (ref 30.0–36.0)
MCV: 73.8 fL — ABNORMAL LOW (ref 80.0–100.0)
Monocytes Absolute: 0.4 10*3/uL (ref 0.1–1.0)
Monocytes Relative: 12 %
Neutro Abs: 1 10*3/uL — ABNORMAL LOW (ref 1.7–7.7)
Neutrophils Relative %: 31 %
Platelet Count: 187 10*3/uL (ref 150–400)
RBC: 4.92 MIL/uL (ref 3.87–5.11)
RDW: 14.8 % (ref 11.5–15.5)
WBC Count: 3.2 10*3/uL — ABNORMAL LOW (ref 4.0–10.5)
nRBC: 0 % (ref 0.0–0.2)

## 2022-06-02 MED ORDER — LENALIDOMIDE 5 MG PO CAPS: ORAL_CAPSULE | ORAL | 0 refills | Status: DC

## 2022-06-02 NOTE — Research (Signed)
Research - [facility] ECOG-ACRIN [id number] Maintenance Cycle 96, Day 1 (delayed by one week)  Patient into clinic unaccompanied today for evaluation prior to beginning treatment Cycle 96 of revlimid.  ANC was too low per protocol to start this cycle last week so patient returns to recheck CBC today.  Clinic is closed tomorrow for July 4th Holiday.  Therefore patient came in one day early for her lab. She would like to stay on her Tuesday schedule so cycle will start tomorrow on Tuesday if CBC is okay today.   Labs: CBC drawn per protocol.   Adverse Events Since Last Visit (in the past month): Patient denies any new or worsening AEs since she was in clinic last week.    Patient reports fungal rash under right breast persists since last week and understands to contact her PCP again if it does not resolve soon.  See AE table below.  Maintenance therapy with Revlimid:  Patient confirmed she has not started this cycle of Revlimid yet.  Patient given a new printed appointment calendar with Cycle 96 treatment days marked, to document doses for the next treatment cycle, Scheduled to begin tomorrow 06/03/22, pending lab results.   Plan: Research nurse will call patient after review of today's lab results to notify if okay to start cycle 96 of Revlimid today as planned. Next lab appointment will be scheduled in 3 weeks (06/24/22) for labs 1 week prior to next MD appointment, along with Bone survey.  MD appt scheduled for 07/01/22.   Reminded patient to call our office if any new questions/concerns prior to next visit. She verbalized understanding.   Maintenance Cycles 95 04/29/22-06/02/22  (end of Cycle 50= 5/39/7673) Solicited &/or Reportable Events Grade Attribution to lenalidomide Cycle # Comments  Anemia Grade 1 Definite 95    Hyperglycemia Grade 0 -      Lymphocyte count decreased Grade 0 -      Neutrophil count decreased Grade 3, Grade 2  Definite 95  05/27/22- Grade 3 06/02/22- Grade 2  Platelet count decreased  Grade 0 -      Diarrhea Grade 1 - 95    Dyspnea Grade 0 -      Edema: limbs Grade 0 -      Fatigue  Grade 2  Probable 95 Moderate, occasionally limiting ADLs (unchanged from previous)  Fever Grade 0 -      Insomnia  Grade 2  Unlikey 95   Irritability Grade 0 -      Muscle weakness trunk Grade 0 -      Nausea Grade 0 -       Peripheral motor neuropathy Grade 0 -      Peripheral sensory neuropathy  Grade 2 Unlikely 95 Moderate symptoms, unchanged from previous  Rash acneiform Grade 0        Vomiting Grade 0 -      Hypertension Grade 3 Unlikely ?  ongoing? Recheck next MD visit    Non-reportable AEs (unsolicited, < Grade 3): Mild pain (grade 1) Breast bone Moderate pain (grade 2) Back ache Migraines (grade 2)  Fungal Infection/rash under breasts (grade 2)  8:05 am CBC results reviewed by this research nurse and ANC is 1.0 which is within parameters to start next cycle of Revlimid tomorrow as planned. Dr. Alvy Bimler is aware and agrees with this plan.  Called patient and instructed to start cycle 96 Revlimid tomorrow. She verbalized understanding.  Foye Spurling, BSN, RN, [employer] Research Nurse II 06/02/2022 8:12 AM

## 2022-06-09 ENCOUNTER — Telehealth: Payer: Self-pay | Admitting: Hematology and Oncology

## 2022-06-09 NOTE — Telephone Encounter (Signed)
Called per 7/10 inbasket, pt aware of updated lab time, Pt transferred to central radioloigy schedulers to update bone scan appt

## 2022-06-11 ENCOUNTER — Telehealth: Payer: Self-pay | Admitting: *Deleted

## 2022-06-11 NOTE — Telephone Encounter (Signed)
[id number]: Called patient regarding Bone Survey scheduled on 06/17/22.  Lab was moved to the following week 06/24/22 but the Bone Survey was not.  Patient states she is okay with keeping it on 7/18 and the lab on 7/25.  Patient aware of her MD appointment on 8/1.  Instructed patient to call back if any questions or problems before our next contact. She verbalized understanding.  Foye Spurling, BSN, RN, [employer] Research Nurse II 06/11/2022 2:21 PM

## 2022-06-14 ENCOUNTER — Other Ambulatory Visit: Payer: Self-pay | Admitting: Hematology and Oncology

## 2022-06-14 DIAGNOSIS — C9001 Multiple myeloma in remission: Secondary | ICD-10-CM

## 2022-06-16 ENCOUNTER — Encounter: Payer: Self-pay | Admitting: Hematology and Oncology

## 2022-06-16 ENCOUNTER — Other Ambulatory Visit: Payer: Self-pay | Admitting: *Deleted

## 2022-06-17 ENCOUNTER — Telehealth: Payer: Self-pay

## 2022-06-17 ENCOUNTER — Inpatient Hospital Stay: Payer: Medicare Other

## 2022-06-17 ENCOUNTER — Ambulatory Visit (HOSPITAL_COMMUNITY)
Admission: RE | Admit: 2022-06-17 | Discharge: 2022-06-17 | Disposition: A | Discharge status: Home / Self Care | Payer: Medicare Other | Source: Ambulatory Visit | Attending: Hematology and Oncology | Admitting: Hematology and Oncology

## 2022-06-17 DIAGNOSIS — C9001 Multiple myeloma in remission: Secondary | ICD-10-CM | POA: Diagnosis not present

## 2022-06-17 DIAGNOSIS — C9000 Multiple myeloma not having achieved remission: Secondary | ICD-10-CM | POA: Diagnosis not present

## 2022-06-17 NOTE — Telephone Encounter (Signed)
Verbal confirmation provided for Revlimid prescription authorization number to [employer]. Authorization #: [id number] (Obtained 06/17/2022).

## 2022-06-23 ENCOUNTER — Other Ambulatory Visit: Payer: Self-pay

## 2022-06-24 ENCOUNTER — Telehealth: Payer: Self-pay | Admitting: *Deleted

## 2022-06-24 ENCOUNTER — Inpatient Hospital Stay: Payer: Medicare Other

## 2022-06-24 ENCOUNTER — Ambulatory Visit: Payer: Medicare Other | Admitting: Hematology and Oncology

## 2022-06-24 ENCOUNTER — Other Ambulatory Visit: Payer: Self-pay

## 2022-06-24 DIAGNOSIS — Z79899 Other long term (current) drug therapy: Secondary | ICD-10-CM | POA: Diagnosis not present

## 2022-06-24 DIAGNOSIS — C9001 Multiple myeloma in remission: Secondary | ICD-10-CM | POA: Diagnosis not present

## 2022-06-24 DIAGNOSIS — D701 Agranulocytosis secondary to cancer chemotherapy: Secondary | ICD-10-CM | POA: Diagnosis not present

## 2022-06-24 LAB — CMP (CANCER CENTER ONLY)
ALT: 25 U/L (ref 0–44)
AST: 26 U/L (ref 15–41)
Albumin: 4 g/dL (ref 3.5–5.0)
Alkaline Phosphatase: 48 U/L (ref 38–126)
Anion gap: 4 — ABNORMAL LOW (ref 5–15)
BUN: 12 mg/dL (ref 8–23)
CO2: 32 mmol/L (ref 22–32)
Calcium: 9.3 mg/dL (ref 8.9–10.3)
Chloride: 105 mmol/L (ref 98–111)
Creatinine: 0.96 mg/dL (ref 0.44–1.00)
GFR, Estimated: >60 mL/min (ref >60)
Glucose, Bld: 90 mg/dL (ref 70–99)
Potassium: 3.6 mmol/L (ref 3.5–5.1)
Sodium: 141 mmol/L (ref 135–145)
Total Bilirubin: 0.8 mg/dL (ref 0.3–1.2)
Total Protein: 7.3 g/dL (ref 6.5–8.1)

## 2022-06-24 LAB — CBC WITH DIFFERENTIAL (CANCER CENTER ONLY)
Abs Immature Granulocytes: 0.01 10*3/uL (ref 0.00–0.07)
Basophils Absolute: 0 10*3/uL (ref 0.0–0.1)
Basophils Relative: 1 %
Eosinophils Absolute: 0.1 10*3/uL (ref 0.0–0.5)
Eosinophils Relative: 5 %
HCT: 39.2 % (ref 36.0–46.0)
Hemoglobin: 12.1 g/dL (ref 12.0–15.0)
Immature Granulocytes: 0 %
Lymphocytes Relative: 48 %
Lymphs Abs: 1.4 10*3/uL (ref 0.7–4.0)
MCH: 23 pg — ABNORMAL LOW (ref 26.0–34.0)
MCHC: 30.9 g/dL (ref 30.0–36.0)
MCV: 74.7 fL — ABNORMAL LOW (ref 80.0–100.0)
Monocytes Absolute: 0.4 10*3/uL (ref 0.1–1.0)
Monocytes Relative: 13 %
Neutro Abs: 0.9 10*3/uL — ABNORMAL LOW (ref 1.7–7.7)
Neutrophils Relative %: 33 %
Platelet Count: 174 10*3/uL (ref 150–400)
RBC: 5.25 MIL/uL — ABNORMAL HIGH (ref 3.87–5.11)
RDW: 15 % (ref 11.5–15.5)
WBC Count: 2.8 10*3/uL — ABNORMAL LOW (ref 4.0–10.5)
nRBC: 0 % (ref 0.0–0.2)

## 2022-06-24 LAB — LACTATE DEHYDROGENASE: LDH: 167 U/L (ref 98–192)

## 2022-06-24 LAB — TSH: TSH: 1.082 u[IU]/mL (ref 0.350–4.500)

## 2022-06-24 NOTE — Telephone Encounter (Signed)
Opened in error

## 2022-06-24 NOTE — Telephone Encounter (Addendum)
[id number]: ANC is 0.9 today and will need to be repeated prior to patient starting next cycle of Revlimid. Lab appointment added for CBC next week 07/01/22 at 7:30 am.  Patient verbalized understanding.  Foye Spurling, BSN, RN, [employer] Research Nurse II 06/24/2022 4:02 PM

## 2022-06-25 ENCOUNTER — Encounter: Payer: Self-pay | Admitting: Hematology and Oncology

## 2022-06-25 LAB — KAPPA/LAMBDA LIGHT CHAINS
Kappa free light chain: 29 mg/L — ABNORMAL HIGH (ref 3.3–19.4)
Kappa, lambda light chain ratio: 1.59 (ref 0.26–1.65)
Lambda free light chains: 18.2 mg/L (ref 5.7–26.3)

## 2022-06-27 ENCOUNTER — Other Ambulatory Visit: Payer: Self-pay

## 2022-06-30 LAB — MULTIPLE MYELOMA PANEL, SERUM
Albumin SerPl Elph-Mcnc: 3.4 g/dL (ref 2.9–4.4)
Albumin/Glob SerPl: 1.1 (ref 0.7–1.7)
Alpha 1: 0.2 g/dL (ref 0.0–0.4)
Alpha2 Glob SerPl Elph-Mcnc: 0.6 g/dL (ref 0.4–1.0)
B-Globulin SerPl Elph-Mcnc: 0.9 g/dL (ref 0.7–1.3)
Gamma Glob SerPl Elph-Mcnc: 1.7 g/dL (ref 0.4–1.8)
Globulin, Total: 3.3 g/dL (ref 2.2–3.9)
IgA: 115 mg/dL (ref 87–352)
IgG (Immunoglobin G), Serum: 1720 mg/dL — ABNORMAL HIGH (ref 586–1602)
IgM (Immunoglobulin M), Srm: 58 mg/dL (ref 26–217)
Total Protein ELP: 6.7 g/dL (ref 6.0–8.5)

## 2022-07-01 ENCOUNTER — Other Ambulatory Visit: Payer: Self-pay

## 2022-07-01 ENCOUNTER — Encounter: Payer: Self-pay | Admitting: Hematology and Oncology

## 2022-07-01 ENCOUNTER — Inpatient Hospital Stay: Payer: Medicare Other

## 2022-07-01 ENCOUNTER — Inpatient Hospital Stay: Payer: Medicare Other | Attending: Hematology and Oncology | Admitting: Hematology and Oncology

## 2022-07-01 ENCOUNTER — Other Ambulatory Visit: Payer: Self-pay | Admitting: *Deleted

## 2022-07-01 ENCOUNTER — Encounter: Payer: Self-pay | Admitting: *Deleted

## 2022-07-01 DIAGNOSIS — C9001 Multiple myeloma in remission: Secondary | ICD-10-CM

## 2022-07-01 DIAGNOSIS — R21 Rash and other nonspecific skin eruption: Secondary | ICD-10-CM

## 2022-07-01 DIAGNOSIS — Z006 Encounter for examination for normal comparison and control in clinical research program: Secondary | ICD-10-CM | POA: Diagnosis not present

## 2022-07-01 DIAGNOSIS — D61811 Other drug-induced pancytopenia: Secondary | ICD-10-CM

## 2022-07-01 LAB — CBC WITH DIFFERENTIAL (CANCER CENTER ONLY)
Abs Immature Granulocytes: 0.01 10*3/uL (ref 0.00–0.07)
Basophils Absolute: 0 10*3/uL (ref 0.0–0.1)
Basophils Relative: 1 %
Eosinophils Absolute: 0.1 10*3/uL (ref 0.0–0.5)
Eosinophils Relative: 2 %
HCT: 36.8 % (ref 36.0–46.0)
Hemoglobin: 11.6 g/dL — ABNORMAL LOW (ref 12.0–15.0)
Immature Granulocytes: 0 %
Lymphocytes Relative: 55 %
Lymphs Abs: 1.9 10*3/uL (ref 0.7–4.0)
MCH: 23.2 pg — ABNORMAL LOW (ref 26.0–34.0)
MCHC: 31.5 g/dL (ref 30.0–36.0)
MCV: 73.7 fL — ABNORMAL LOW (ref 80.0–100.0)
Monocytes Absolute: 0.4 10*3/uL (ref 0.1–1.0)
Monocytes Relative: 13 %
Neutro Abs: 1 10*3/uL — ABNORMAL LOW (ref 1.7–7.7)
Neutrophils Relative %: 29 %
Platelet Count: 186 10*3/uL (ref 150–400)
RBC: 4.99 MIL/uL (ref 3.87–5.11)
RDW: 15.1 % (ref 11.5–15.5)
WBC Count: 3.4 10*3/uL — ABNORMAL LOW (ref 4.0–10.5)
nRBC: 0 % (ref 0.0–0.2)

## 2022-07-01 MED ORDER — LENALIDOMIDE 5 MG PO CAPS: ORAL_CAPSULE | ORAL | 0 refills | Status: DC

## 2022-07-01 NOTE — Assessment & Plan Note (Signed)
This is due to her treatment She is not symptomatic Observe 

## 2022-07-01 NOTE — Progress Notes (Signed)
[place] OFFICE PROGRESS NOTE  Patient Care Team: Wenda Low, MD as PCP - General (Internal Medicine) Heath Lark, MD as Consulting Physician (Hematology and Oncology)  ASSESSMENT & PLAN:  Multiple myeloma in remission [facility]) Her recent myeloma panel showed that she is in remission Her slightly elevated free light chains and LDH are unrelated The patient is comfortable to remain on Revlimid indefinitely. 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   All questions were answered. The patient knows to call the clinic with any problems, questions or concerns. The total time spent in the appointment was 20 minutes encounter with patients including review of chart and various tests results, discussions about plan of care and coordination of care plan   Heath Lark, MD 07/01/2022 8:22 AM  INTERVAL HISTORY: Please see below for problem oriented charting. she returns for treatment follow-up on maintenance protocol with lenalidomide She is doing well and remain active No recent infection The rash on skin under the bra line has healed although it remains somewhat itchy and discolored  REVIEW OF SYSTEMS:   Constitutional: Denies fevers, chills or abnormal weight loss Eyes: Denies blurriness of vision Ears, nose, mouth, throat, and face: Denies mucositis or sore throat Respiratory: Denies cough, dyspnea or wheezes Cardiovascular: Denies palpitation, chest  discomfort or lower extremity swelling Gastrointestinal:  Denies nausea, heartburn or change in bowel habits Skin: Denies abnormal skin rashes Lymphatics: Denies new lymphadenopathy or easy bruising Neurological:Denies numbness, tingling or new weaknesses Behavioral/Psych: Mood is stable, no new changes  All other systems were reviewed with the patient and are negative.  I have reviewed the past medical history, past surgical history, social history and family history with the patient and they are unchanged from previous note.  ALLERGIES:  is allergic to metformin.  MEDICATIONS:  Current Outpatient Medications  Medication Sig Dispense Refill   aspirin 325 MG tablet Take 325 mg by mouth daily.      Cholecalciferol (VITAMIN D3) 2000 UNITS TABS Take 2,000 Units by mouth daily.     dorzolamide-timolol (COSOPT) 22.3-6.8 MG/ML ophthalmic solution Place 1 drop into both eyes 2 (two) times daily.     Galcanezumab-gnlm (EMGALITY) 120 MG/ML SOAJ Inject 120 mg into the skin every 30 (thirty) days. 2 mL 0   hydrochlorothiazide (HYDRODIURIL) 25 MG tablet Take 25 mg by mouth every evening.      lenalidomide (REVLIMID) 5 MG capsule TAKE 1 CAPSULE BY MOUTH EVERY  OTHER DAY FOR 21 DAYS ON THEN 7  DAYS OFF 11 capsule 0   levothyroxine (SYNTHROID, LEVOTHROID) 75 MCG tablet Take 75 mcg by mouth daily before breakfast.     methocarbamol (ROBAXIN) 500 MG tablet Take 1 tablet (500 mg total) by mouth every 8 (eight) hours as needed for muscle spasms. 90 tablet 1   Multiple Vitamins-Minerals (CENTRUM SILVER PO) Take 1 tablet by mouth daily.      polyethylene glycol (MIRALAX / GLYCOLAX) packet Take 17 g by mouth daily  as needed for mild constipation.      RESTASIS 0.05 % ophthalmic emulsion PLACE 1 DROP IN BOTH EYES IN THE EVENING  4   rosuvastatin (CRESTOR) 5 MG tablet Take 5 mg by mouth daily at 2 PM.     sitaGLIPtin (JANUVIA) 50 MG tablet Take 50 mg by mouth daily.     traMADol (ULTRAM) 50 MG tablet Take by  mouth every 6 (six) hours as needed.     traZODone (DESYREL) 50 MG tablet TAKE 1 TABLET (50 MG TOTAL) BY MOUTH AT BEDTIME. 90 tablet 3   venlafaxine XR (EFFEXOR-XR) 150 MG 24 hr capsule Take 1 capsule (150 mg total) by mouth daily with breakfast. 90 capsule 3   verapamil (VERELAN PM) 120 MG 24 hr capsule Take 1 capsule (120 mg total) by mouth daily. 90 capsule 3   No current facility-administered medications for this visit.   Facility-Administered Medications Ordered in Other Visits  Medication Dose Route Frequency Provider Last Rate Last Admin   gadopentetate dimeglumine (MAGNEVIST) injection 15 mL  15 mL Intravenous Once PRN Melvenia Beam, MD        SUMMARY OF ONCOLOGIC HISTORY: Oncology History Overview Note  ISS stage 1 IgG lambda subtype (serum albumin 3.6, Beta2 microglobulin 2.32) Durie Salmon Stage 1   Multiple myeloma in remission ([place])  10/10/2013 Imaging   Skeletal survery was negative   11/09/2013 Bone Marrow Biopsy   BM biopsy confirmed myeloma, 76% involved, IgG lambda subtype   12/06/2013 - 08/29/2014 Chemotherapy   Sh received chemo with revlimid, Velcade, Dexamethasone and Zometa. Patient particpated in clinical research [facility] [phone]   02/23/2014 Bone Marrow Biopsy   Repeat bone marrow biopsy showed 5% involvement   03/31/2014 Adverse Reaction   Zometa was discontinued due to osteonecrosis of the jaw.   05/05/2014 Imaging   Imaging study of the neck showed no explanation that could cause right neck pain. She is noted to have incidental left upper lung nodule. Plan to repeat imaging study in 3 months.   09/06/2014 Imaging   Bone survey showed no evidence of fracture   09/14/2014 Bone Marrow Biopsy   Bone marrow biopsy showed 8% residual plasma cells by manual count but none on the biopsy specimen   09/26/2014 -  Chemotherapy   She is started on cycle 1 of maintenance Revlimid   01/31/2015 Imaging    chest x-ray showed pneumonia. Treatment was placed on hold.    05/03/2015 Bone Marrow Biopsy   Accession: [id number] repeat bone marrow aspirate and biopsy show 5% residual plasma cells   10/14/2016 Bone Marrow Biopsy   Bone marrow biopsy showed the plasma cells represent 4% of all cells with lack of large aggregates or sheets. To assess the plasma cell clonality, immunohistochemical stains is performed and it lack clonality. Normal cytogenetics and FISH   01/09/2017 Imaging   CT chest showed ground-glass 1.5 cm apical left upper lobe pulmonary nodule. Initial follow-up with CT at 6-12 months is recommended to confirm persistence. If persistent, repeat CT is recommended every 2 years until 5 years of stability has been established. This recommendation follows the consensus statement: Guidelines for Management of Incidental Pulmonary Nodules Detected on CT Images: From the Fleischner Society 2017; Radiology 2017; 284:228-243. 2. Mild patchy ground-glass opacities in the right upper lobe, probably inflammatory, which can also be reassessed on follow-up chest CT performed for the above dominant ground-glass nodule. 3. Solitary 3 mm right lower lobe solid pulmonary nodule, which can also be reassessed  on follow-up chest CT performed for the above dominant ground-glass nodule. 4. No thoracic adenopathy. 5. Aortic atherosclerosis.  Two-vessel coronary atherosclerosis.   06/30/2017 Imaging   1. No interval change in the 11 x 13 mm ground-glass nodule left upper lobe. Given the nearly 6 months of imaging stability, repeat CT is recommended every 2 years until 5 years of stability has been established. This recommendation follows the consensus statement: Guidelines for Management of Incidental Pulmonary Nodules Detected on CT Images: From the Fleischner Society 2017; Radiology 2017; 284:228-243. 2. Interval resolution of the tiny patchy ground-glass nodules right upper lobe, likely infectious/inflammatory etiology. 3. Stable 3 mm right lower lobe pulmonary nodule. Attention on  follow-up recommended. 4.  Aortic Atherosclerois (ICD10-170.0)   09/29/2017 Imaging   Skeletal survey 1. Questionable new tiny lucency noted the posterior portion of C3 vertebral body. This may represent a small lytic lesion.  2. No definite thoracic lesion noted on today's exam. Stable lucencies in the left ilium and acetabulum.  3. No other focal abnormalities identified. The left hip is unremarkable .   04/16/2021 -  Chemotherapy   Patient is on Treatment Plan : MYELOMA Research [facility] [id number] Arm D Maintenance Lenalidomide q28d     06/17/2022 Imaging   There are no focal lytic lesions. Other findings as described in the body of the report. Overall, no significant interval changes are noted since 07/31/2020.       PHYSICAL EXAMINATION: ECOG PERFORMANCE STATUS: 1 - Symptomatic but completely ambulatory  Vitals:   07/01/22 0800  BP: (!) 144/80  Pulse: 60  Resp: 18  Temp: (!) 97.5 F (36.4 C)  SpO2: 100%   Filed Weights   07/01/22 0800  Weight: 157 lb 9.6 oz (71.5 kg)    GENERAL:alert, no distress and comfortable SKIN: Noted skin discoloration under her breast.  No signs of active yeast infection EYES: normal, Conjunctiva are pink and non-injected, sclera clear OROPHARYNX:no exudate, no erythema and lips, buccal mucosa, and tongue normal  NECK: supple, thyroid normal size, non-tender, without nodularity LYMPH:  no palpable lymphadenopathy in the cervical, axillary or inguinal LUNGS: clear to auscultation and percussion with normal breathing effort HEART: regular rate & rhythm and no murmurs and no lower extremity edema ABDOMEN:abdomen soft, non-tender and normal bowel sounds Musculoskeletal:no cyanosis of digits and no clubbing  NEURO: alert & oriented x 3 with fluent speech, no focal motor/sensory deficits  LABORATORY DATA:  I have reviewed the data as listed    Component Value Date/Time   NA 141 06/24/2022 0745   NA 141 09/29/2017 0830   K 3.6 06/24/2022 0745    K 3.6 09/29/2017 0830   CL 105 06/24/2022 0745   CO2 32 06/24/2022 0745   CO2 29 09/29/2017 0830   GLUCOSE 90 06/24/2022 0745   GLUCOSE 87 09/29/2017 0830   BUN 12 06/24/2022 0745   BUN 14.0 09/29/2017 0830   CREATININE 0.96 06/24/2022 0745   CREATININE 0.9 09/29/2017 0830   CALCIUM 9.3 06/24/2022 0745   CALCIUM 9.6 09/29/2017 0830   PROT 7.3 06/24/2022 0745   PROT 7.3 09/29/2017 0832   PROT 8.0 09/29/2017 0830   ALBUMIN 4.0 06/24/2022 0745   ALBUMIN 3.8 09/29/2017 0830   AST 26 06/24/2022 0745   AST 21 09/29/2017 0830   ALT 25 06/24/2022 0745   ALT 69 (H) 09/29/2017 0830   ALKPHOS 48 06/24/2022 0745   ALKPHOS 60 09/29/2017 0830   BILITOT 0.8 06/24/2022 0745   BILITOT 0.46 09/29/2017 0830  GFRNONAA >60 06/24/2022 0745   GFRAA >60 07/31/2020 0737    No results found for: "SPEP", "UPEP"  Lab Results  Component Value Date   WBC 3.4 (L) 07/01/2022   NEUTROABS 1.0 (L) 07/01/2022   HGB 11.6 (L) 07/01/2022   HCT 36.8 07/01/2022   MCV 73.7 (L) 07/01/2022   PLT 186 07/01/2022      Chemistry      Component Value Date/Time   NA 141 06/24/2022 0745   NA 141 09/29/2017 0830   K 3.6 06/24/2022 0745   K 3.6 09/29/2017 0830   CL 105 06/24/2022 0745   CO2 32 06/24/2022 0745   CO2 29 09/29/2017 0830   BUN 12 06/24/2022 0745   BUN 14.0 09/29/2017 0830   CREATININE 0.96 06/24/2022 0745   CREATININE 0.9 09/29/2017 0830      Component Value Date/Time   CALCIUM 9.3 06/24/2022 0745   CALCIUM 9.6 09/29/2017 0830   ALKPHOS 48 06/24/2022 0745   ALKPHOS 60 09/29/2017 0830   AST 26 06/24/2022 0745   AST 21 09/29/2017 0830   ALT 25 06/24/2022 0745   ALT 69 (H) 09/29/2017 0830   BILITOT 0.8 06/24/2022 0745   BILITOT 0.46 09/29/2017 0830       RADIOGRAPHIC STUDIES: I have personally reviewed the radiological images as listed and agreed with the findings in the report. DG Bone Survey Met  Result Date: 06/17/2022 CLINICAL DATA:  Multiple myeloma staging EXAM: METASTATIC BONE  SURVEY COMPARISON:  07/31/2020 FINDINGS: No focal lytic lesions are seen. Visualized lung fields are clear. Degenerative changes are noted in cervical spine, more so at C5-C6 and C6-C7 levels. There is possible rudimentary left cervical rib. Degenerative changes are noted in mid and lower thoracic spine. Degenerative changes are noted in the lower lumbar spine at L4-L5 and L5-S1 levels. Overall, no significant interval changes are noted. IMPRESSION: There are no focal lytic lesions. Other findings as described in the body of the report. Overall, no significant interval changes are noted since 07/31/2020. Electronically Signed   By: Elmer Picker M.D.   On: 06/17/2022 15:01

## 2022-07-01 NOTE — Assessment & Plan Note (Addendum)
She has mild rash under her bra line related to prior yeast infection This is not related to her treatment It is all healed I recommend the patient to stop putting on antifungal cream

## 2022-07-01 NOTE — Assessment & Plan Note (Signed)
Her recent myeloma panel showed that she is in remission Her slightly elevated free light chains and LDH are unrelated The patient is comfortable to remain on Revlimid indefinitely. 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

## 2022-07-01 NOTE — Research (Signed)
Research - [facility]G-ACRIN [id number] Maintenance Cycle 97, Day 1  Patient into clinic unaccompanied today for evaluation prior to beginning treatment Cycle 97 of revlimid.     Labs w/myeloma panel: Obtained on 06/24/2022 within 7-day window per protocol, to allow adequate time for results prior to treatment. CBC repeated this morning due to [place] from last week was below parameters to start Revlimid today. Results from 06/24/22 and today's CBC were reviewed by Dr. Alvy Bimler and found results to be within parameters for continued dosing at Maintenance phase Dose Level -3, Revlimid 5 mg every other day for 21 days, every 28 days. 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 MD, bone marrow biopsy and aspirate would not be considered standard of care at this time. Dr. Alvy Bimler also states that 24 hr urine is not required to assess disease response.  Skeletal Survey: Completed 06/17/22 shows no evidence of disease. Reviewed by Dr. Alvy Bimler.   History and physical exam: See MD note dated today.      Adverse Events:  Patient reported the following in the past 3 months; Ongoing fatigue (grade 2) not limiting self-care. 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 Daily moderate back ache (grade 2) is ongoing. Occasional Insomnia (grade 2) is ongoing and patient takes trazodone PRN. Occasional diarrhea (grade 1).  Occasional bone pain  (grade 2).  Ongoing, intermittent migraine headaches (grade 2).  Occasional neck pain (grade 2) and three days of mild breast bone pain (grade1). Patient had a fungal rash (grade 2) under her left breast which she used ketoconazole cream. Dr. Alvy Bimler assessed and states the infection is resolved although skin remains discolored.  Patient continues to exercise regularly, swimming laps in the pool 3 days per week as well as riding the stationary bike 2 days per week.   See AE table below.  Maintenance therapy with Revlimid: Based on  lab results, AE review and history and physical by Dr. Alvy Bimler, patient meets criteria for continued treatment with Revlimid, which she is in agreement to do. Patient is aware that treatment will continue indefinitely, unless there is evidence of disease recurrence, adverse effects requiring treatment discontinuation, or patient/physician decision to discontinue therapy. Patient returned completed cycle 96 Medication Calendar, confirming dosing at dose level -3, Revlimid 82m every other day, Days 1-21. Patient notes that no doses were missed. Patient given printed appointment calendar with Cycle 97 treatment days marked, to document doses for the next treatment cycle, scheduled to begin today.  Plan: Patient will continue monthly clinic visits with lab. These are due on 07/29/22 and 08/26/22 for hematology and AE assessments prior to beginning subsequent treatment cycles. Myeloma panel and other labs to be performed on 09/16/22 in advance of the next MD clinic visit at the end of Cycle 99, scheduled for 09/23/22. Reminded patient to call our office if any new questions/concerns prior to next visit. She verbalized understanding.   Maintenance Cycles 94-96 04/01/22-07/01/22  (end of Cycle 953=76/14/4315 Solicited &/or Reportable Events Grade Attribution to lenalidomide Cycle # Comments  Anemia Grade 1 Definite 95,96    Hyperglycemia Grade 0 -      Lymphocyte count decreased Grade 0 -      Neutrophil count decreased Grade 3  Definite 95,96    Neutrophil count decreased Grade 2 Definite 94,95,96   Platelet count decreased Grade 0 -      Diarrhea Grade 1 Possible 95,96    Dyspnea Grade 0 -  Edema: limbs Grade 0 -      Fatigue  Grade 2  Probable 94,95,96 Moderate, occasionally limiting ADLs (unchanged from previous)  Fever Grade 0 -      Insomnia  Grade 2  Unlikey 95,96   Irritability Grade 0 -      Muscle weakness trunk Grade 0 -      Nausea Grade 0 -       Peripheral motor neuropathy Grade 0 -       Peripheral sensory neuropathy  Grade 2 Unlikely 94,95,96 Moderate symptoms, unchanged from previous  Rash acneiform Grade 0        Vomiting Grade 0 -      Hypertension Grade 2 N/A 96  Grade 2 HTN at Baseline    Non-reportable AEs (unsolicited, < Grade 3): Decreased WBC count (grade 1-2) Mild pain (grade 1) Breast bone Moderate pain (grade 2) Back ache Neck pain Migraines (grade 2)  Fungal Infection/rash under breasts (grade 2)  Foye Spurling, BSN, RN, CCRP Clinical Research Nurse II 07/01/2022 8:52 AM

## 2022-07-02 ENCOUNTER — Other Ambulatory Visit: Payer: Self-pay

## 2022-07-19 ENCOUNTER — Other Ambulatory Visit: Payer: Self-pay | Admitting: Hematology and Oncology

## 2022-07-19 DIAGNOSIS — C9001 Multiple myeloma in remission: Secondary | ICD-10-CM

## 2022-07-21 ENCOUNTER — Encounter: Payer: Self-pay | Admitting: Hematology and Oncology

## 2022-07-21 NOTE — Telephone Encounter (Signed)
Pls refill electronically °

## 2022-07-23 ENCOUNTER — Telehealth: Payer: Self-pay | Admitting: *Deleted

## 2022-07-23 NOTE — Patient Outreach (Signed)
  Care Coordination   07/23/2022 Name: Carla Perry MRN: [id number] DOB: [birth year]   Care Coordination Outreach Attempts:  An unsuccessful telephone outreach was attempted today to offer the patient information about available care coordination services as a benefit of their health plan.   Follow Up Plan:  Additional outreach attempts will be made to offer the patient care coordination information and services.   Encounter Outcome:  No Answer  Care Coordination Interventions Activated:  Yes   Care Coordination Interventions:  No, not indicated    [facility] [phone]

## 2022-07-29 ENCOUNTER — Inpatient Hospital Stay: Payer: Medicare Other

## 2022-07-29 ENCOUNTER — Encounter: Payer: Self-pay | Admitting: *Deleted

## 2022-07-29 DIAGNOSIS — C9001 Multiple myeloma in remission: Secondary | ICD-10-CM | POA: Diagnosis not present

## 2022-07-29 DIAGNOSIS — Z006 Encounter for examination for normal comparison and control in clinical research program: Secondary | ICD-10-CM | POA: Diagnosis present

## 2022-07-29 LAB — CBC WITH DIFFERENTIAL (CANCER CENTER ONLY)
Abs Immature Granulocytes: 0.02 10*3/uL (ref 0.00–0.07)
Basophils Absolute: 0 10*3/uL (ref 0.0–0.1)
Basophils Relative: 1 %
Eosinophils Absolute: 0.2 10*3/uL (ref 0.0–0.5)
Eosinophils Relative: 6 %
HCT: 35.6 % — ABNORMAL LOW (ref 36.0–46.0)
Hemoglobin: 11.4 g/dL — ABNORMAL LOW (ref 12.0–15.0)
Immature Granulocytes: 1 %
Lymphocytes Relative: 53 %
Lymphs Abs: 2 10*3/uL (ref 0.7–4.0)
MCH: 23.3 pg — ABNORMAL LOW (ref 26.0–34.0)
MCHC: 32 g/dL (ref 30.0–36.0)
MCV: 72.8 fL — ABNORMAL LOW (ref 80.0–100.0)
Monocytes Absolute: 0.5 10*3/uL (ref 0.1–1.0)
Monocytes Relative: 14 %
Neutro Abs: 0.9 10*3/uL — ABNORMAL LOW (ref 1.7–7.7)
Neutrophils Relative %: 25 %
Platelet Count: 176 10*3/uL (ref 150–400)
RBC: 4.89 MIL/uL (ref 3.87–5.11)
RDW: 15 % (ref 11.5–15.5)
WBC Count: 3.6 10*3/uL — ABNORMAL LOW (ref 4.0–10.5)
nRBC: 0 % (ref 0.0–0.2)

## 2022-07-29 NOTE — Research (Signed)
Research - [facility] E[facility]G-ACRIN [id number] Maintenance Cycle 98, Day 1  Patient into clinic unaccompanied today for evaluation prior to beginning treatment Cycle 98 of revlimid.     Labs: CBC drawn per protocol.   Adverse Events Since Last Visit :  Patient reported the following; Ongoing fatigue (grade 2) not limiting self-care. 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 Daily moderate back ache (grade 2) is ongoing.   Patient continues to exercise regularly and recently spent several weeks out of town visiting family.    See AE table below.  Maintenance therapy with Revlimid:  Patient returned completed cycle 97 Medication Calendar, confirming dosing at dose level -3, Revlimid '5mg'$  every other day, Days 1-21. Patient notes that no doses were missed. Patient given printed appointment calendar with Cycle 98 treatment days marked, to document doses for the next treatment cycle, scheduled to begin today pending lab results.   Plan: Research nurse will call patient after review of today's lab results to notify if okay to start cycle 98 of Revlimid today as planned. Next lab appointment scheduled in 4 weeks (08/26/22) for labs prior to cycle 99 of revlimid.    Reminded patient to call our office if any new questions/concerns prior to next visit. She verbalized understanding.   Maintenance Cycle 97 07/01/22-07/29/22  (end of Cycle 36= 6/44/0347) Solicited &/or Reportable Events Grade Attribution to lenalidomide Cycle # Comments  Anemia Grade 1 Definite 97    Hyperglycemia Grade 0 -      Lymphocyte count decreased Grade 0 -      Neutrophil count decreased Grade 3  Definite 97  recheck next week on 08/05/22  Platelet count decreased Grade 0 -      Diarrhea Grade 0 -     Dyspnea Grade 0 -      Edema: limbs Grade 0 -      Fatigue  Grade 2  Probable 97 Moderate, occasionally limiting ADLs (unchanged from previous)  Fever Grade 0 -      Insomnia  Grade 0 -    Irritability Grade 0 -       Muscle weakness trunk Grade 0 -      Nausea Grade 0 -       Peripheral motor neuropathy Grade 0 -      Peripheral sensory neuropathy  Grade 2 Unlikely 97 Moderate symptoms, unchanged from previous  Rash acneiform Grade 0  -      Vomiting Grade 0 -        Non-reportable AEs (unsolicited, < Grade 3): Decreased WBC count (grade 1) Moderate pain (grade 2) Back ache  8:05 am CBC results reviewed by this research nurse and ANC is 0.9 which is not within parameters to start next cycle of Revlimid today. Per protocol, Revlimid will be held and CBC will be rechecked in one week to determine if patient can start next cycle. Dr. Alvy Bimler is aware and agrees with this plan.  Called patient and notified to hold Revlimid and we will recheck CBC next week.  Patient agreed to come in next Tuesday morning 08/05/22 for the CBC recheck. She verbalized understanding to hold Revlimid until we can review next CBC.  We will adjust future appointments accordingly after next weeks appointment.   Foye Spurling, BSN, RN, [employer] Research Nurse II 07/29/2022 8:08 AM

## 2022-08-05 ENCOUNTER — Encounter: Payer: Self-pay | Admitting: Hematology and Oncology

## 2022-08-05 ENCOUNTER — Inpatient Hospital Stay: Payer: Medicare Other | Attending: Hematology and Oncology

## 2022-08-05 ENCOUNTER — Encounter: Payer: Self-pay | Admitting: *Deleted

## 2022-08-05 ENCOUNTER — Other Ambulatory Visit: Payer: Self-pay

## 2022-08-05 DIAGNOSIS — C9001 Multiple myeloma in remission: Secondary | ICD-10-CM | POA: Insufficient documentation

## 2022-08-05 DIAGNOSIS — Z006 Encounter for examination for normal comparison and control in clinical research program: Secondary | ICD-10-CM | POA: Diagnosis present

## 2022-08-05 LAB — CBC WITH DIFFERENTIAL (CANCER CENTER ONLY)
Abs Immature Granulocytes: 0 10*3/uL (ref 0.00–0.07)
Basophils Absolute: 0 10*3/uL (ref 0.0–0.1)
Basophils Relative: 1 %
Eosinophils Absolute: 0.1 10*3/uL (ref 0.0–0.5)
Eosinophils Relative: 3 %
HCT: 34.4 % — ABNORMAL LOW (ref 36.0–46.0)
Hemoglobin: 10.9 g/dL — ABNORMAL LOW (ref 12.0–15.0)
Immature Granulocytes: 0 %
Lymphocytes Relative: 50 %
Lymphs Abs: 2.1 10*3/uL (ref 0.7–4.0)
MCH: 23.2 pg — ABNORMAL LOW (ref 26.0–34.0)
MCHC: 31.7 g/dL (ref 30.0–36.0)
MCV: 73.2 fL — ABNORMAL LOW (ref 80.0–100.0)
Monocytes Absolute: 0.5 10*3/uL (ref 0.1–1.0)
Monocytes Relative: 13 %
Neutro Abs: 1.4 10*3/uL — ABNORMAL LOW (ref 1.7–7.7)
Neutrophils Relative %: 33 %
Platelet Count: 228 10*3/uL (ref 150–400)
RBC: 4.7 MIL/uL (ref 3.87–5.11)
RDW: 15.1 % (ref 11.5–15.5)
WBC Count: 4.1 10*3/uL (ref 4.0–10.5)
nRBC: 0 % (ref 0.0–0.2)

## 2022-08-05 MED ORDER — LENALIDOMIDE 5 MG PO CAPS: ORAL_CAPSULE | ORAL | 0 refills | Status: DC

## 2022-08-05 NOTE — Research (Signed)
Research - [facility] E[facility]G-ACRIN [id number] Maintenance Cycle 98, Day 1  Patient into clinic unaccompanied today for evaluation prior to beginning treatment Cycle 98 of revlimid.  This cycle has been delayed by one week due to low ANC at last week's visit.  Patient returns to recheck [place].   Labs: CBC drawn per protocol.   Adverse Events Since Last Visit :  Patient reported the following; Ongoing fatigue (grade 2) not limiting self-care. 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 Daily moderate back ache (grade 2) is ongoing.  Insomnia (grade 2) requiring trazodone prn since last week.  Patient continues to exercise regularly.   See AE table below.  Maintenance therapy with Revlimid:  Patient given printed appointment calendar with Cycle 98 treatment days marked, to document doses for the next treatment cycle, scheduled to begin today pending lab results.   Plan: Research nurse will call patient after review of today's lab results to notify if okay to start cycle 98 of Revlimid today.  Maintenance Cycle 97 07/01/22-07/29/22  (end of Cycle 50= 9/32/6712) Solicited &/or Reportable Events Grade Attribution to lenalidomide Cycle # Comments  Anemia Grade 1 Definite 97    Hyperglycemia Grade 0 -      Lymphocyte count decreased Grade 0 -      Neutrophil count decreased Grade 2-3  Definite 97 Grade 3- 8/29  Grade 2- 08/05/22  Platelet count decreased Grade 0 -      Diarrhea Grade 0 -     Dyspnea Grade 0 -      Edema: limbs Grade 0 -      Fatigue  Grade 2  Probable 97 Moderate, occasionally limiting ADLs (unchanged from previous)  Fever Grade 0 -      Insomnia  Grade 2 Unlikely 97   Irritability Grade 0 -      Muscle weakness trunk Grade 0 -      Nausea Grade 0 -       Peripheral motor neuropathy Grade 0 -      Peripheral sensory neuropathy  Grade 2 Unlikely 97 Moderate symptoms, unchanged from previous  Rash acneiform Grade 0  -      Vomiting Grade 0 -         Non-reportable AEs (unsolicited, < Grade 3): Decreased WBC count (grade 1) Moderate pain (grade 2) Back ache  8:05 am CBC results reviewed by this research nurse and ANC is 1.4 which is within parameters to start next cycle of Revlimid today. Dr. Alvy Bimler is aware and agrees with this plan. Notified patient of lab results and okay to start Revlimid today.  Next lab appointment will be scheduled in 4 weeks (09/02/22) for labs prior to cycle 99 of revlimid. Other future appointments will be adjusted out by one week due to delay of this cycle.  Reminded patient to call our office if any new questions/concerns prior to next visit. She verbalized understanding.   Foye Spurling, BSN, RN, [employer] Research Nurse II 08/05/2022 9:25 AM

## 2022-08-06 ENCOUNTER — Other Ambulatory Visit: Payer: Self-pay

## 2022-08-08 DIAGNOSIS — E119 Type 2 diabetes mellitus without complications: Secondary | ICD-10-CM | POA: Diagnosis not present

## 2022-08-08 DIAGNOSIS — H401111 Primary open-angle glaucoma, right eye, mild stage: Secondary | ICD-10-CM | POA: Diagnosis not present

## 2022-08-08 DIAGNOSIS — H40022 Open angle with borderline findings, high risk, left eye: Secondary | ICD-10-CM | POA: Diagnosis not present

## 2022-08-11 ENCOUNTER — Telehealth: Payer: Self-pay | Admitting: *Deleted

## 2022-08-11 NOTE — Telephone Encounter (Signed)
Patient reports she starting having chest congestion and all over body aches/pain on Saturday morning 08/09/22. She continues to have symptoms and took a Covid19 home test this morning and it was positive.  Patient asks if Dr. Alvy Bimler can prescribe medication for [phone].  Instructed patient to contact her PCP and then also call this nurse back to notify if she is starting a new medication to make sure it is not contraindicated with Revlimid.  Patient may need to hold revlimid if that is the case. Patient verbalized understanding and will keep research nurse updated.  Patient returned call and reports she was prescribed Paxlovid and will start taking tomorrow for 5 days.  She took her revlimid this morning. Next dose due on Wednesday. Instructed patient to hold revlimid until this research nurse can discuss with Dr. Alvy Bimler and make sure it is okay for her to continue this cycle while she is ill with Covid. Patient verbalized understanding and will wait to take next dose until she hears back from nurse.  Foye Spurling, BSN, RN, [place] Nurse II 08/11/2022 1:47 PM

## 2022-08-12 ENCOUNTER — Telehealth: Payer: Self-pay | Admitting: *Deleted

## 2022-08-12 NOTE — Telephone Encounter (Addendum)
[id number] Confirmed with Dr. Alvy Bimler that patient's [phone] infection is unrelated to Revlimid.  Dr. Alvy Bimler instructs for patient to hold revlimid for 14 days to help patient recover better from her infection.  At that point patient will be on day 22 and starting 7 days off at end of cycle. Dr. Alvy Bimler says okay to hold until next cycle is due to start on 09/02/22 if patient is recovered by that visit.   Instructed patient to hold revlimid until she comes into clinic for next study visit/ lab on 09/02/22.  Instructed patient to call back if any changes and if she is not recovering from her symptoms prior to coming into the clinic.  This will be more than 21 days from onset of patient's symptoms.  Patient verbalized understanding.  Foye Spurling, BSN, RN, CCRP Clinical Research Nurse II 08/12/2022 11:46 AM

## 2022-08-16 ENCOUNTER — Other Ambulatory Visit: Payer: Self-pay | Admitting: Hematology and Oncology

## 2022-08-16 DIAGNOSIS — C9001 Multiple myeloma in remission: Secondary | ICD-10-CM

## 2022-08-18 NOTE — Telephone Encounter (Signed)
Pls refill electronically °

## 2022-08-20 ENCOUNTER — Other Ambulatory Visit: Payer: Self-pay | Admitting: Hematology and Oncology

## 2022-08-20 DIAGNOSIS — C9001 Multiple myeloma in remission: Secondary | ICD-10-CM

## 2022-08-20 MED ORDER — LENALIDOMIDE 5 MG PO CAPS: ORAL_CAPSULE | ORAL | 0 refills | Status: DC

## 2022-08-26 ENCOUNTER — Inpatient Hospital Stay: Payer: Medicare Other

## 2022-09-01 ENCOUNTER — Telehealth: Payer: Self-pay | Admitting: *Deleted

## 2022-09-01 NOTE — Telephone Encounter (Signed)
[id number]: Patient reports Covid19 symptoms have resolved and confirmed her lab research appointment for tomorrow morning.  Foye Spurling, BSN, RN, [place] Nurse II [phone] 09/01/2022 1:27 PM

## 2022-09-02 ENCOUNTER — Inpatient Hospital Stay: Payer: Medicare Other | Attending: Hematology and Oncology

## 2022-09-02 ENCOUNTER — Encounter: Payer: Self-pay | Admitting: *Deleted

## 2022-09-02 ENCOUNTER — Other Ambulatory Visit: Payer: Self-pay

## 2022-09-02 DIAGNOSIS — C9001 Multiple myeloma in remission: Secondary | ICD-10-CM | POA: Insufficient documentation

## 2022-09-02 DIAGNOSIS — Z79899 Other long term (current) drug therapy: Secondary | ICD-10-CM | POA: Diagnosis not present

## 2022-09-02 DIAGNOSIS — D701 Agranulocytosis secondary to cancer chemotherapy: Secondary | ICD-10-CM | POA: Insufficient documentation

## 2022-09-02 DIAGNOSIS — Z006 Encounter for examination for normal comparison and control in clinical research program: Secondary | ICD-10-CM | POA: Insufficient documentation

## 2022-09-02 LAB — CBC WITH DIFFERENTIAL (CANCER CENTER ONLY)
Abs Immature Granulocytes: 0 10*3/uL (ref 0.00–0.07)
Basophils Absolute: 0 10*3/uL (ref 0.0–0.1)
Basophils Relative: 0 %
Eosinophils Absolute: 0.1 10*3/uL (ref 0.0–0.5)
Eosinophils Relative: 2 %
HCT: 35.5 % — ABNORMAL LOW (ref 36.0–46.0)
Hemoglobin: 11.2 g/dL — ABNORMAL LOW (ref 12.0–15.0)
Immature Granulocytes: 0 %
Lymphocytes Relative: 50 %
Lymphs Abs: 1.7 10*3/uL (ref 0.7–4.0)
MCH: 23.2 pg — ABNORMAL LOW (ref 26.0–34.0)
MCHC: 31.5 g/dL (ref 30.0–36.0)
MCV: 73.7 fL — ABNORMAL LOW (ref 80.0–100.0)
Monocytes Absolute: 0.4 10*3/uL (ref 0.1–1.0)
Monocytes Relative: 12 %
Neutro Abs: 1.2 10*3/uL — ABNORMAL LOW (ref 1.7–7.7)
Neutrophils Relative %: 36 %
Platelet Count: 208 10*3/uL (ref 150–400)
RBC: 4.82 MIL/uL (ref 3.87–5.11)
RDW: 16.1 % — ABNORMAL HIGH (ref 11.5–15.5)
WBC Count: 3.5 10*3/uL — ABNORMAL LOW (ref 4.0–10.5)
nRBC: 0 % (ref 0.0–0.2)

## 2022-09-02 NOTE — Research (Signed)
Research - [facility] ECOG-ACRIN [id number] Maintenance Cycle 99, Day 1  Patient into clinic unaccompanied today for evaluation prior to beginning treatment Cycle 99 of revlimid.  Last 2 weeks of cycle 98 Revlimid was held, per Dr. Alvy Bimler, due to Covid19 infection.  Patient took total of 4 doses (out of 11) for cycle 98 and last dose was taken on 08/11/22.   Labs: CBC drawn per protocol.   Adverse Events Since Last Visit :  Patient reported the following; Covid19 related symptoms including all over body aches, chest congestion, coughing, generalized weakness and fatigue since 08/08/22. Positive Covid19 test on 08/11/22 confirmed symptoms due to [place]. Patient took Paxlovid x 5 days per her PCP.  Symptoms completely resolved by 08/15/22.  Patient had a negative Covid19 test at home on 08/18/22.  Ongoing fatigue (grade 2) not limiting self-care. 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 Daily moderate back ache  (grade 2) is ongoing.  Insomnia (grade 2) requiring trazodone prn. Patient denies any nausea, vomiting, diarrhea, shortness of breath, swelling, fever, irritability, motor peripheral neuropathy or rash.  Patient resumed her regular exercise after recovering from Covid and having a negative home test.   See AE table below.  Maintenance therapy with Revlimid:  Patient given printed appointment calendar with Cycle 99 treatment days marked, to document doses for the next treatment cycle, scheduled to begin today pending lab results.   Plan: Research nurse will call patient after review of today's lab results to notify if okay to start cycle 99 of Revlimid today.  Maintenance Cycle 98 08/05/22-09/02/97  (end of Cycle 98 = 08/08/3531) Solicited &/or Reportable Events Grade Attribution to lenalidomide Cycle # Comments  Anemia Grade 1 Definite 97, 98    Hyperglycemia Grade 0 -      Lymphocyte count decreased Grade 0 -      Neutrophil count decreased Grade 2-3  Definite 97, 98 Grade  3- 8/29  Grade 2- 08/05/22 Grade 2- 09/02/22   Platelet count decreased Grade 0 -      Diarrhea Grade 0 -     Dyspnea Grade 0 -      Edema: limbs Grade 0 -      Fatigue  Grade 2  Probable 97, 98 Moderate, occasionally limiting ADLs (unchanged from previous)  Fever Grade 0 -      Insomnia  Grade 2 Unlikely 97, 98   Irritability Grade 0 -      Muscle weakness trunk Grade 1 -  98  c/o generalized weakness due to Covid19 infection  Nausea Grade 0 -       Peripheral motor neuropathy Grade 0 -      Peripheral sensory neuropathy  Grade 2 Unlikely 97, 98 Moderate symptoms, unchanged from previous  Rash acneiform Grade 0  -      Vomiting Grade 0 -        Non-reportable AEs (unsolicited, < Grade 3): Decreased WBC count (grade 1) Moderate pain (grade 2) Back ache Covid19 Infection (grade 2) - Unrelated to Revlimid per Dr. Alvy Bimler  8:35 am CBC results reviewed by this research nurse and ANC is 1.2 which is within parameters to start next cycle of Revlimid today. Dr. Alvy Bimler is aware and agrees with this plan. Notified patient of lab results and okay to start Revlimid today.  Next lab appointment is scheduled in 3 weeks (09/23/22) for myeloma panel 1 week prior to MD appointment (09/30/22) and cycle 100 of revlimid.  Reminded patient to call  our office if any new questions/concerns prior to next visit. She verbalized understanding.   Foye Spurling, BSN, RN, CCRP Clinical Research Nurse II 09/02/2022 7:55 AM

## 2022-09-16 ENCOUNTER — Inpatient Hospital Stay: Payer: Medicare Other

## 2022-09-23 ENCOUNTER — Inpatient Hospital Stay: Payer: Medicare Other

## 2022-09-23 ENCOUNTER — Inpatient Hospital Stay: Payer: Medicare Other | Admitting: Hematology and Oncology

## 2022-09-23 ENCOUNTER — Telehealth: Payer: Self-pay | Admitting: *Deleted

## 2022-09-23 DIAGNOSIS — D701 Agranulocytosis secondary to cancer chemotherapy: Secondary | ICD-10-CM | POA: Diagnosis not present

## 2022-09-23 DIAGNOSIS — Z79899 Other long term (current) drug therapy: Secondary | ICD-10-CM | POA: Diagnosis not present

## 2022-09-23 DIAGNOSIS — C9001 Multiple myeloma in remission: Secondary | ICD-10-CM

## 2022-09-23 LAB — CBC WITH DIFFERENTIAL (CANCER CENTER ONLY)
Abs Immature Granulocytes: 0.01 10*3/uL (ref 0.00–0.07)
Basophils Absolute: 0 10*3/uL (ref 0.0–0.1)
Basophils Relative: 1 %
Eosinophils Absolute: 0.1 10*3/uL (ref 0.0–0.5)
Eosinophils Relative: 3 %
HCT: 36.3 % (ref 36.0–46.0)
Hemoglobin: 11.4 g/dL — ABNORMAL LOW (ref 12.0–15.0)
Immature Granulocytes: 0 %
Lymphocytes Relative: 51 %
Lymphs Abs: 1.9 10*3/uL (ref 0.7–4.0)
MCH: 23.3 pg — ABNORMAL LOW (ref 26.0–34.0)
MCHC: 31.4 g/dL (ref 30.0–36.0)
MCV: 74.1 fL — ABNORMAL LOW (ref 80.0–100.0)
Monocytes Absolute: 0.5 10*3/uL (ref 0.1–1.0)
Monocytes Relative: 14 %
Neutro Abs: 1.1 10*3/uL — ABNORMAL LOW (ref 1.7–7.7)
Neutrophils Relative %: 31 %
Platelet Count: 175 10*3/uL (ref 150–400)
RBC: 4.9 MIL/uL (ref 3.87–5.11)
RDW: 15.6 % — ABNORMAL HIGH (ref 11.5–15.5)
Smear Review: NORMAL
WBC Count: 3.7 10*3/uL — ABNORMAL LOW (ref 4.0–10.5)
nRBC: 0 % (ref 0.0–0.2)

## 2022-09-23 LAB — CMP (CANCER CENTER ONLY)
ALT: 21 U/L (ref 0–44)
AST: 21 U/L (ref 15–41)
Albumin: 3.9 g/dL (ref 3.5–5.0)
Alkaline Phosphatase: 49 U/L (ref 38–126)
Anion gap: 7 (ref 5–15)
BUN: 12 mg/dL (ref 8–23)
CO2: 28 mmol/L (ref 22–32)
Calcium: 9.2 mg/dL (ref 8.9–10.3)
Chloride: 104 mmol/L (ref 98–111)
Creatinine: 0.92 mg/dL (ref 0.44–1.00)
GFR, Estimated: >60 mL/min (ref >60)
Glucose, Bld: 84 mg/dL (ref 70–99)
Potassium: 3.7 mmol/L (ref 3.5–5.1)
Sodium: 139 mmol/L (ref 135–145)
Total Bilirubin: 0.6 mg/dL (ref 0.3–1.2)
Total Protein: 7.7 g/dL (ref 6.5–8.1)

## 2022-09-23 LAB — TSH: TSH: 1.733 u[IU]/mL (ref 0.350–4.500)

## 2022-09-23 LAB — LACTATE DEHYDROGENASE: LDH: 161 U/L (ref 98–192)

## 2022-09-23 NOTE — Telephone Encounter (Signed)
[id number]:  Informed patient of CBC results and ANC is 1.1.  She will not need to repeat CBC next week prior to MD appointment.  Patient verbalized understanding and confirmed her appointment with Dr. Alvy Bimler on 09/30/22 at 8 am.  Foye Spurling, BSN, RN, [place] Nurse II [phone] 09/23/2022 10:08 AM

## 2022-09-24 LAB — KAPPA/LAMBDA LIGHT CHAINS
Kappa free light chain: 42.8 mg/L — ABNORMAL HIGH (ref 3.3–19.4)
Kappa, lambda light chain ratio: 2.03 — ABNORMAL HIGH (ref 0.26–1.65)
Lambda free light chains: 21.1 mg/L (ref 5.7–26.3)

## 2022-09-29 ENCOUNTER — Telehealth: Payer: Self-pay | Admitting: *Deleted

## 2022-09-29 ENCOUNTER — Other Ambulatory Visit: Payer: Self-pay

## 2022-09-29 DIAGNOSIS — C9001 Multiple myeloma in remission: Secondary | ICD-10-CM

## 2022-09-29 LAB — MULTIPLE MYELOMA PANEL, SERUM
Albumin SerPl Elph-Mcnc: 3.7 g/dL (ref 2.9–4.4)
Albumin/Glob SerPl: 1.1 (ref 0.7–1.7)
Alpha 1: 0.2 g/dL (ref 0.0–0.4)
Alpha2 Glob SerPl Elph-Mcnc: 0.6 g/dL (ref 0.4–1.0)
B-Globulin SerPl Elph-Mcnc: 1 g/dL (ref 0.7–1.3)
Gamma Glob SerPl Elph-Mcnc: 1.7 g/dL (ref 0.4–1.8)
Globulin, Total: 3.5 g/dL (ref 2.2–3.9)
IgA: 128 mg/dL (ref 87–352)
IgG (Immunoglobin G), Serum: 1810 mg/dL — ABNORMAL HIGH (ref 586–1602)
IgM (Immunoglobulin M), Srm: 54 mg/dL (ref 26–217)
Total Protein ELP: 7.2 g/dL (ref 6.0–8.5)

## 2022-09-29 MED ORDER — LENALIDOMIDE 5 MG PO CAPS: ORAL_CAPSULE | ORAL | 0 refills | Status: DC

## 2022-09-29 NOTE — Telephone Encounter (Signed)
Sent prescription again to pharmacy. Rx sent 9/16. Then sent on 9/20 as no print. Per [employer], it has a message that it was canceled. Maybe it was too early and the prescription was canceled.

## 2022-09-29 NOTE — Telephone Encounter (Signed)
Notified patient that new Rx was sent to pharmacy this morning. Patient reports she has talked to pharmacy and revlimid should arrive by tonight.  Patient confirmed her appointment with Dr. Alvy Bimler tomorrow morning.  Foye Spurling, BSN, RN, [place] Nurse II [phone] 09/29/2022 11:23 AM

## 2022-09-29 NOTE — Telephone Encounter (Signed)
Hassan Rowan, can you find out why?

## 2022-09-29 NOTE — Telephone Encounter (Signed)
Patient left VM stating she has not received her Revlimid refill. Patient is due to start next cycle of revlimid tomorrow 09/30/22.   Foye Spurling, BSN, RN, [place] Nurse II [phone] 09/29/2022 8:59 AM

## 2022-09-30 ENCOUNTER — Encounter: Payer: Self-pay | Admitting: *Deleted

## 2022-09-30 ENCOUNTER — Inpatient Hospital Stay: Payer: Medicare Other | Admitting: Hematology and Oncology

## 2022-09-30 ENCOUNTER — Other Ambulatory Visit: Payer: Self-pay

## 2022-09-30 ENCOUNTER — Encounter: Payer: Self-pay | Admitting: Hematology and Oncology

## 2022-09-30 VITALS — BP 134/79 | HR 64 | Temp 97.8°F | Resp 18 | Ht 67.0 in | Wt 162.8 lb

## 2022-09-30 DIAGNOSIS — D630 Anemia in neoplastic disease: Secondary | ICD-10-CM

## 2022-09-30 DIAGNOSIS — T451X5A Adverse effect of antineoplastic and immunosuppressive drugs, initial encounter: Secondary | ICD-10-CM | POA: Diagnosis not present

## 2022-09-30 DIAGNOSIS — C9001 Multiple myeloma in remission: Secondary | ICD-10-CM

## 2022-09-30 DIAGNOSIS — D701 Agranulocytosis secondary to cancer chemotherapy: Secondary | ICD-10-CM

## 2022-09-30 DIAGNOSIS — Z79899 Other long term (current) drug therapy: Secondary | ICD-10-CM | POA: Diagnosis not present

## 2022-09-30 NOTE — Assessment & Plan Note (Signed)
This is due to her treatment She is not symptomatic Observe

## 2022-09-30 NOTE — Assessment & Plan Note (Signed)
Her recent myeloma panel showed that she is in remission Her slightly elevated free light chains and LDH are unrelated The patient is comfortable to remain on Revlimid indefinitely. 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

## 2022-09-30 NOTE — Research (Signed)
Research - [facility] ECOG-ACRIN [id number] Maintenance Cycle 100, Day 1  Patient into clinic unaccompanied today for evaluation prior to beginning treatment Cycle 100 of revlimid.  Labs w/myeloma panel: Obtained on 09/23/2022 within 7-day window per protocol, to allow adequate time for results prior to treatment. Results were reviewed by Dr. Alvy Bimler and found to be within parameters for continued dosing at Maintenance phase Dose Level -3, Revlimid 5 mg every other day for 21 days, every 28 days. Review of myeloma panel shows evidence of continued Complete Response.  Dr. Alvy Bimler states that the elevated levels on the [facility] are not clinically significant and do not indicate disease relapse. She says the elevation is due to dehydration. She confirmed with patient that patient does not drink anything prior to lab appointments in the morning and last drink was 12 hours before lab appointment. Dr. Alvy Bimler instructed patient to drink water in the morning before her lab appointment next time to avoid artificially elevated results on the [facility].  Patient verbalized understanding.  Per Dr. Alvy Bimler, bone marrow biopsy and aspirate would not be considered standard of care at this time. Dr. Alvy Bimler also states that 24 hr urine is not required to assess disease response.  History and physical exam: See MD note dated today.      Adverse Events:  Patient reported the following in the past 3 months; Ongoing fatigue (grade 2) not limiting self-care. 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 Occasional Insomnia (grade 2) is ongoing and patient takes trazodone PRN.  Patient denies any nausea, vomiting, diarrhea, shortness of breath, swelling, fever, irritability, motor peripheral neuropathy or rash.  Other reported AEs include ongoing intermittent migraine headaches (grade 2) and daily moderate back ache (grade 2). Patient also reports 2 episodes of left sided chest pain (grade 1) during light to moderate  activity. She says she had to rest for a few minutes and the pain went away each time. She says the pain radiated to her left shoulder. She had difficulty describing the pain, said it could be sharp or dull but she can't remember. Patient says she used to have this type of pain about 5 years ago and was evaluated by a cardiologist but a cause for the pain was not discovered.  Instructed patient to call [phone] if pain persists and is not relieved with rest. Patient verbalized understanding.  Dr. Alvy Bimler states chest pain is not related to taking revlimid. She instructs for patient to continue to monitor for chest pain and she may need to go back to cardiologist if it continues.  Patient verbalized understanding.  Patient has been engaging in her regular exercise of swimming and riding the stationary bike.   See AE table below.  Maintenance Cycle 97-99 07/01/22-09/30/22 (end of Cycle 99 = 02/54/2706) Solicited &/or Reportable Events Grade Attribution to lenalidomide Cycle # Comments  Anemia Grade 1 Definite 97, 98, 99    Hyperglycemia Grade 0 -      Lymphocyte count decreased Grade 0 -      Neutrophil count decreased Grade 2-3  Definite 97, 98, 99 Grade 3- 8/29  Grade 2- 9/5 Grade 2- 10/3 Grade 2- 10/31    Platelet count decreased Grade 0 -      Diarrhea Grade 0 -      Dyspnea Grade 0 -      Edema: limbs Grade 0 -      Fatigue  Grade 2  Probable 97, 98, 99 Moderate, occasionally limiting ADLs (unchanged from  previous)  Fever Grade 0 -      Insomnia  Grade 2 Unlikely 97, 98, 99    Irritability Grade 0 -      Muscle weakness trunk Grade 1 Unrelated  98  c/o generalized weakness due to Covid19 infection  Nausea Grade 0 -       Peripheral motor neuropathy Grade 0 -      Peripheral sensory neuropathy  Grade 2 Unlikely 97, 98, 99 Moderate symptoms, unchanged from previous  Rash acneiform Grade 0  -      Vomiting Grade 0 -        Non-reportable AEs (unsolicited, < Grade 3): Decreased WBC count  (grade 1) Moderate pain (grade 2) Back ache Covid19 Infection (grade 2) Migraines (grade 2)  Chest pain (grade 1)   Maintenance therapy with Revlimid: Based on lab results, AE review and history and physical by Dr. Gorsuch, patient meets criteria for continued treatment with Revlimid, which she is in agreement to do. Patient is aware that treatment will continue indefinitely, unless there is evidence of disease recurrence, adverse effects requiring treatment discontinuation, or patient/physician decision to discontinue therapy. Patient returned completed cycle 99 Medication Calendar, confirming dosing at dose level -3, Revlimid 5mg every other day, Days 1-21. Patient notes that no doses were missed. Patient given printed appointment calendar with Cycle 100 treatment days marked, to document doses for the next treatment cycle, scheduled to begin today.  Plan: Patient will continue monthly clinic visits with lab. These are due on 10/28/22 and 11/25/22 for hematology and AE assessments prior to beginning subsequent treatment cycles. Myeloma panel and other labs to be performed on 12/16/22 in advance of the next MD clinic visit at the end of Cycle 102, scheduled for 12/23/22. Reminded patient to call our office if any new questions/concerns prior to next visit. She verbalized understanding.   [name], BSN, RN, CCRP Clinical Research Nurse II [phone] 09/30/2022 8:24 AM  

## 2022-09-30 NOTE — Assessment & Plan Note (Signed)

## 2022-09-30 NOTE — Progress Notes (Signed)
[place] OFFICE PROGRESS NOTE  Patient Care Team: Wenda Low, MD as PCP - General (Internal Medicine) Heath Lark, MD as Consulting Physician (Hematology and Oncology)  ASSESSMENT & PLAN:  Multiple myeloma in remission [facility]) Her recent myeloma panel showed that she is in remission Her slightly elevated free light chains and LDH are unrelated The patient is comfortable to remain on Revlimid indefinitely. She will continue treatment per research protocol She will continue calcium with vitamin D supplement and aspirin for DVT prophylaxis She is not on Zometa due to history of osteonecrosis of the jaw I reviewed the serum light chain results with the patient and do not feel that the elevated levels are related to her disease It is most likely related to slight dehydration   Chemotherapy induced neutropenia ([place]) This is due to her treatment She is not symptomatic Observe  Anemia in neoplastic disease This is likely due to recent treatment. The patient denies recent history of bleeding such as epistaxis, hematuria or hematochezia. She is asymptomatic from the anemia. I will observe for now.  She does not require transfusion now. I will continue the chemotherapy at current dose without dosage adjustment.  If the anemia gets progressive worse in the future, I might have to delay her treatment or adjust the chemotherapy dose.   No orders of the defined types were placed in this encounter.   All questions were answered. The patient knows to call the clinic with any problems, questions or concerns. The total time spent in the appointment was 25 minutes encounter with patients including review of chart and various tests results, discussions about plan of care and coordination of care plan   Heath Lark, MD 09/30/2022 10:28 AM  INTERVAL HISTORY: Please see below for problem oriented charting. she returns for treatment follow-up on maintenance Revlimid She tolerated  treatment well No recent infection No new side effects from therapy  REVIEW OF SYSTEMS:   Constitutional: Denies fevers, chills or abnormal weight loss Eyes: Denies blurriness of vision Ears, nose, mouth, throat, and face: Denies mucositis or sore throat Respiratory: Denies cough, dyspnea or wheezes Cardiovascular: Denies palpitation, chest discomfort or lower extremity swelling Gastrointestinal:  Denies nausea, heartburn or change in bowel habits Skin: Denies abnormal skin rashes Lymphatics: Denies new lymphadenopathy or easy bruising Neurological:Denies numbness, tingling or new weaknesses Behavioral/Psych: Mood is stable, no new changes  All other systems were reviewed with the patient and are negative.  I have reviewed the past medical history, past surgical history, social history and family history with the patient and they are unchanged from previous note.  ALLERGIES:  is allergic to metformin.  MEDICATIONS:  Current Outpatient Medications  Medication Sig Dispense Refill   aspirin 325 MG tablet Take 325 mg by mouth daily.      Cholecalciferol (VITAMIN D3) 2000 UNITS TABS Take 2,000 Units by mouth daily.     dorzolamide-timolol (COSOPT) 22.3-6.8 MG/ML ophthalmic solution Place 1 drop into both eyes 2 (two) times daily.     Galcanezumab-gnlm (EMGALITY) 120 MG/ML SOAJ Inject 120 mg into the skin every 30 (thirty) days. 2 mL 0   hydrochlorothiazide (HYDRODIURIL) 25 MG tablet Take 25 mg by mouth every evening.      lenalidomide (REVLIMID) 5 MG capsule TAKE 1 CAPSULE BY MOUTH EVERY  OTHER DAY FOR 21 DAYS ON THEN 7  DAYS OFF 11 capsule 0   levothyroxine (SYNTHROID, LEVOTHROID) 75 MCG tablet Take 75 mcg by mouth daily before breakfast.  methocarbamol (ROBAXIN) 500 MG tablet Take 1 tablet (500 mg total) by mouth every 8 (eight) hours as needed for muscle spasms. 90 tablet 1   Multiple Vitamins-Minerals (CENTRUM SILVER PO) Take 1 tablet by mouth daily.      polyethylene glycol  (MIRALAX / GLYCOLAX) packet Take 17 g by mouth daily as needed for mild constipation.      RESTASIS 0.05 % ophthalmic emulsion PLACE 1 DROP IN BOTH EYES IN THE EVENING  4   rosuvastatin (CRESTOR) 5 MG tablet Take 5 mg by mouth daily at 2 PM.     sitaGLIPtin (JANUVIA) 50 MG tablet Take 50 mg by mouth daily.     traMADol (ULTRAM) 50 MG tablet Take by mouth every 6 (six) hours as needed.     traZODone (DESYREL) 50 MG tablet TAKE 1 TABLET (50 MG TOTAL) BY MOUTH AT BEDTIME. 90 tablet 3   venlafaxine XR (EFFEXOR-XR) 150 MG 24 hr capsule Take 1 capsule (150 mg total) by mouth daily with breakfast. 90 capsule 3   verapamil (VERELAN PM) 120 MG 24 hr capsule Take 1 capsule (120 mg total) by mouth daily. 90 capsule 3   No current facility-administered medications for this visit.   Facility-Administered Medications Ordered in Other Visits  Medication Dose Route Frequency Provider Last Rate Last Admin   gadopentetate dimeglumine (MAGNEVIST) injection 15 mL  15 mL Intravenous Once PRN Melvenia Beam, MD        SUMMARY OF ONCOLOGIC HISTORY: Oncology History Overview Note  ISS stage 1 IgG lambda subtype (serum albumin 3.6, Beta2 microglobulin 2.32) Durie Salmon Stage 1   Multiple myeloma in remission ([place])  10/10/2013 Imaging   Skeletal survery was negative   11/09/2013 Bone Marrow Biopsy   BM biopsy confirmed myeloma, 76% involved, IgG lambda subtype   12/06/2013 - 08/29/2014 Chemotherapy   Sh received chemo with revlimid, Velcade, Dexamethasone and Zometa. Patient particpated in clinical research CTSU [id number]   02/23/2014 Bone Marrow Biopsy   Repeat bone marrow biopsy showed 5% involvement   03/31/2014 Adverse Reaction   Zometa was discontinued due to osteonecrosis of the jaw.   05/05/2014 Imaging   Imaging study of the neck showed no explanation that could cause right neck pain. She is noted to have incidental left upper lung nodule. Plan to repeat imaging study in 3 months.   09/06/2014 Imaging    Bone survey showed no evidence of fracture   09/14/2014 Bone Marrow Biopsy   Bone marrow biopsy showed 8% residual plasma cells by manual count but none on the biopsy specimen   09/26/2014 -  Chemotherapy   She is started on cycle 1 of maintenance Revlimid   01/31/2015 Imaging    chest x-ray showed pneumonia. Treatment was placed on hold.   05/03/2015 Bone Marrow Biopsy   Accession: [id number] repeat bone marrow aspirate and biopsy show 5% residual plasma cells   10/14/2016 Bone Marrow Biopsy   Bone marrow biopsy showed the plasma cells represent 4% of all cells with lack of large aggregates or sheets. To assess the plasma cell clonality, immunohistochemical stains is performed and it lack clonality. Normal cytogenetics and FISH   01/09/2017 Imaging   CT chest showed ground-glass 1.5 cm apical left upper lobe pulmonary nodule. Initial follow-up with CT at 6-12 months is recommended to confirm persistence. If persistent, repeat CT is recommended every 2 years until 5 years of stability has been established. This recommendation follows the consensus statement: Guidelines for Management of Incidental Pulmonary Nodules  Detected on CT Images: From the Fleischner Society 2017; Radiology 2017; 284:228-243. 2. Mild patchy ground-glass opacities in the right upper lobe, probably inflammatory, which can also be reassessed on follow-up chest CT performed for the above dominant ground-glass nodule. 3. Solitary 3 mm right lower lobe solid pulmonary nodule, which can also be reassessed on follow-up chest CT performed for the above dominant ground-glass nodule. 4. No thoracic adenopathy. 5. Aortic atherosclerosis.  Two-vessel coronary atherosclerosis.   06/30/2017 Imaging   1. No interval change in the 11 x 13 mm ground-glass nodule left upper lobe. Given the nearly 6 months of imaging stability, repeat CT is recommended every 2 years until 5 years of stability has been established. This recommendation follows the  consensus statement: Guidelines for Management of Incidental Pulmonary Nodules Detected on CT Images: From the Fleischner Society 2017; Radiology 2017; 284:228-243. 2. Interval resolution of the tiny patchy ground-glass nodules right upper lobe, likely infectious/inflammatory etiology. 3. Stable 3 mm right lower lobe pulmonary nodule. Attention on follow-up recommended. 4.  Aortic Atherosclerois (ICD10-170.0)   09/29/2017 Imaging   Skeletal survey 1. Questionable new tiny lucency noted the posterior portion of C3 vertebral body. This may represent a small lytic lesion.  2. No definite thoracic lesion noted on today's exam. Stable lucencies in the left ilium and acetabulum.  3. No other focal abnormalities identified. The left hip is unremarkable .   04/16/2021 - 08/05/2022 Chemotherapy   Patient is on Treatment Plan : MYELOMA Research CTSU [id number] Arm D Maintenance Lenalidomide q28d     06/17/2022 Imaging   There are no focal lytic lesions. Other findings as described in the body of the report. Overall, no significant interval changes are noted since 07/31/2020.       PHYSICAL EXAMINATION: ECOG PERFORMANCE STATUS: 0 - Asymptomatic  Vitals:   09/30/22 0806  BP: 134/79  Pulse: 64  Resp: 18  Temp: 97.8 F (36.6 C)  SpO2: 100%   Filed Weights   09/30/22 0806  Weight: 162 lb 12.8 oz (73.8 kg)    GENERAL:alert, no distress and comfortable SKIN: skin color, texture, turgor are normal, no rashes or significant lesions EYES: normal, Conjunctiva are pink and non-injected, sclera clear OROPHARYNX:no exudate, no erythema and lips, buccal mucosa, and tongue normal  NECK: supple, thyroid normal size, non-tender, without nodularity LYMPH:  no palpable lymphadenopathy in the cervical, axillary or inguinal LUNGS: clear to auscultation and percussion with normal breathing effort HEART: regular rate & rhythm and no murmurs and no lower extremity edema ABDOMEN:abdomen soft, non-tender and  normal bowel sounds Musculoskeletal:no cyanosis of digits and no clubbing  NEURO: alert & oriented x 3 with fluent speech, no focal motor/sensory deficits  LABORATORY DATA:  I have reviewed the data as listed    Component Value Date/Time   NA 139 09/23/2022 0729   NA 141 09/29/2017 0830   K 3.7 09/23/2022 0729   K 3.6 09/29/2017 0830   CL 104 09/23/2022 0729   CO2 28 09/23/2022 0729   CO2 29 09/29/2017 0830   GLUCOSE 84 09/23/2022 0729   GLUCOSE 87 09/29/2017 0830   BUN 12 09/23/2022 0729   BUN 14.0 09/29/2017 0830   CREATININE 0.92 09/23/2022 0729   CREATININE 0.9 09/29/2017 0830   CALCIUM 9.2 09/23/2022 0729   CALCIUM 9.6 09/29/2017 0830   PROT 7.7 09/23/2022 0729   PROT 7.3 09/29/2017 0832   PROT 8.0 09/29/2017 0830   ALBUMIN 3.9 09/23/2022 0729   ALBUMIN 3.8 09/29/2017 0830  AST 21 09/23/2022 0729   AST 21 09/29/2017 0830   ALT 21 09/23/2022 0729   ALT 69 (H) 09/29/2017 0830   ALKPHOS 49 09/23/2022 0729   ALKPHOS 60 09/29/2017 0830   BILITOT 0.6 09/23/2022 0729   BILITOT 0.46 09/29/2017 0830   GFRNONAA >60 09/23/2022 0729   GFRAA >60 07/31/2020 0737    No results found for: "SPEP", "UPEP"  Lab Results  Component Value Date   WBC 3.7 (L) 09/23/2022   NEUTROABS 1.1 (L) 09/23/2022   HGB 11.4 (L) 09/23/2022   HCT 36.3 09/23/2022   MCV 74.1 (L) 09/23/2022   PLT 175 09/23/2022      Chemistry      Component Value Date/Time   NA 139 09/23/2022 0729   NA 141 09/29/2017 0830   K 3.7 09/23/2022 0729   K 3.6 09/29/2017 0830   CL 104 09/23/2022 0729   CO2 28 09/23/2022 0729   CO2 29 09/29/2017 0830   BUN 12 09/23/2022 0729   BUN 14.0 09/29/2017 0830   CREATININE 0.92 09/23/2022 0729   CREATININE 0.9 09/29/2017 0830      Component Value Date/Time   CALCIUM 9.2 09/23/2022 0729   CALCIUM 9.6 09/29/2017 0830   ALKPHOS 49 09/23/2022 0729   ALKPHOS 60 09/29/2017 0830   AST 21 09/23/2022 0729   AST 21 09/29/2017 0830   ALT 21 09/23/2022 0729   ALT 69 (H)  09/29/2017 0830   BILITOT 0.6 09/23/2022 0729   BILITOT 0.46 09/29/2017 0830

## 2022-10-20 ENCOUNTER — Other Ambulatory Visit: Payer: Self-pay | Admitting: Hematology and Oncology

## 2022-10-20 DIAGNOSIS — C9001 Multiple myeloma in remission: Secondary | ICD-10-CM

## 2022-10-22 ENCOUNTER — Other Ambulatory Visit: Payer: Self-pay

## 2022-10-22 DIAGNOSIS — C9001 Multiple myeloma in remission: Secondary | ICD-10-CM

## 2022-10-22 MED ORDER — LENALIDOMIDE 5 MG PO CAPS: ORAL_CAPSULE | ORAL | 0 refills | Status: DC

## 2022-10-28 ENCOUNTER — Encounter: Payer: Self-pay | Admitting: *Deleted

## 2022-10-28 ENCOUNTER — Inpatient Hospital Stay: Payer: Medicare Other | Attending: Hematology and Oncology

## 2022-10-28 DIAGNOSIS — C9001 Multiple myeloma in remission: Secondary | ICD-10-CM

## 2022-10-28 DIAGNOSIS — Z006 Encounter for examination for normal comparison and control in clinical research program: Secondary | ICD-10-CM | POA: Diagnosis present

## 2022-10-28 LAB — CBC WITH DIFFERENTIAL (CANCER CENTER ONLY)
Abs Immature Granulocytes: 0.01 10*3/uL (ref 0.00–0.07)
Basophils Absolute: 0 10*3/uL (ref 0.0–0.1)
Basophils Relative: 1 %
Eosinophils Absolute: 0.1 10*3/uL (ref 0.0–0.5)
Eosinophils Relative: 3 %
HCT: 36.9 % (ref 36.0–46.0)
Hemoglobin: 11.4 g/dL — ABNORMAL LOW (ref 12.0–15.0)
Immature Granulocytes: 0 %
Lymphocytes Relative: 53 %
Lymphs Abs: 1.7 10*3/uL (ref 0.7–4.0)
MCH: 23.1 pg — ABNORMAL LOW (ref 26.0–34.0)
MCHC: 30.9 g/dL (ref 30.0–36.0)
MCV: 74.8 fL — ABNORMAL LOW (ref 80.0–100.0)
Monocytes Absolute: 0.5 10*3/uL (ref 0.1–1.0)
Monocytes Relative: 14 %
Neutro Abs: 0.9 10*3/uL — ABNORMAL LOW (ref 1.7–7.7)
Neutrophils Relative %: 29 %
Platelet Count: 203 10*3/uL (ref 150–400)
RBC: 4.93 MIL/uL (ref 3.87–5.11)
RDW: 15.7 % — ABNORMAL HIGH (ref 11.5–15.5)
WBC Count: 3.2 10*3/uL — ABNORMAL LOW (ref 4.0–10.5)
nRBC: 0 % (ref 0.0–0.2)

## 2022-10-28 NOTE — Research (Signed)
Research - CTSU ECOG-ACRIN [id number] Maintenance Cycle 101, Day 1  Patient into clinic unaccompanied today for evaluation prior to beginning treatment Cycle 101 of revlimid.  Labs: CBC drawn per protocol.    Adverse Events Since Last Visit :   Ongoing fatigue (grade 2) not limiting self-care. 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 Occasional Insomnia (grade 2) is ongoing and patient takes trazodone PRN.  Patient denies any nausea, vomiting, diarrhea, shortness of breath, swelling, fever, irritability, motor peripheral neuropathy or rash.  Other reported AEs include ongoing intermittent migraine headaches (grade 2), almost daily moderate back ache (grade 2) and occasional generalized bone pain (grade 1). Patient denies any chest pain since last visit but states has had some left shoulder and arm pain (grade 2) for 3 to 4 days which she feels was muscular. She took a muscle relaxer (methocarbamol) which she says helped.   Patient has been engaging in her regular exercise of swimming and riding the stationary bike.  Although she was not able to swim on the days she had arm/shoulder pain.  See AE table below.  Maintenance Cycle 100 09/30/22-10/28/22 (end of Cycle 100 = 69/62/9528) Solicited &/or Reportable Events Grade Attribution to lenalidomide Cycle # Comments  Anemia Grade 1 Definite 100    Hyperglycemia Grade 0 -      Lymphocyte count decreased Grade 0 -      Neutrophil count decreased Grade 3  Definite 100 Revlimid held x 1 week    Platelet count decreased Grade 0 -      Diarrhea Grade 0 -      Dyspnea Grade 0 -      Edema: limbs Grade 0 -      Fatigue  Grade 2  Probable 100 Moderate, occasionally limiting ADLs (unchanged from previous)  Fever Grade 0 -      Insomnia  Grade 2 Unlikely 100    Irritability Grade 0 -      Muscle weakness trunk Grade 0 -      Nausea Grade 0 -       Peripheral motor neuropathy Grade 0 -      Peripheral sensory neuropathy   Grade 2 Unlikely 100 Moderate symptoms, unchanged from previous  Rash acneiform Grade 0  -      Vomiting Grade 0 -        Non-reportable AEs (unsolicited, < Grade 3): Decreased WBC count (grade 1) Moderate pain (grade 2)- Back ache and Left shoulder/arm  Mild pain (grade 1)- Bone  Migraines (grade 2)   Maintenance therapy with Revlimid:  Patient returned completed cycle 100 Medication Calendar, confirming dosing at dose level -3, Revlimid '5mg'$  every other day, Days 1-21. Patient notes that no doses were missed.  Patient given printed appointment calendar with Cycle 101 treatment days marked, to document doses for the next treatment cycle, scheduled to begin today pending lab results.   Plan: Research nurse will call patient after review of today's lab results to notify if okay to start cycle 101 of Revlimid today.   Foye Spurling, BSN, RN, [place] Nurse II [phone] 10/28/2022 8:00 AM   Phone Call: ANC is 0.9 (grade 3) today and not within study parameters to start next cycle of Revlimid today.  Called patient and notified of [place] result too low to start treatment today.  Instructed patient to hold Revlimid and return to clinic in one week to re check CBC.  Patient is going out of town next  week on Tuesday so we will schedule her lab appointment for Monday 11/03/22.  Reminded patient to call our office if any new questions/concerns prior to next visit. She verbalized understanding.  Foye Spurling, BSN, RN, [place] Nurse II [phone] 10/28/2022 8:40 AM

## 2022-10-30 DIAGNOSIS — I10 Essential (primary) hypertension: Secondary | ICD-10-CM | POA: Diagnosis not present

## 2022-10-30 DIAGNOSIS — I700 Atherosclerosis of aorta: Secondary | ICD-10-CM | POA: Diagnosis not present

## 2022-10-30 DIAGNOSIS — D508 Other iron deficiency anemias: Secondary | ICD-10-CM | POA: Diagnosis not present

## 2022-10-30 DIAGNOSIS — E039 Hypothyroidism, unspecified: Secondary | ICD-10-CM | POA: Diagnosis not present

## 2022-10-30 DIAGNOSIS — E041 Nontoxic single thyroid nodule: Secondary | ICD-10-CM | POA: Diagnosis not present

## 2022-10-30 DIAGNOSIS — E1169 Type 2 diabetes mellitus with other specified complication: Secondary | ICD-10-CM | POA: Diagnosis not present

## 2022-10-30 DIAGNOSIS — E785 Hyperlipidemia, unspecified: Secondary | ICD-10-CM | POA: Diagnosis not present

## 2022-10-30 DIAGNOSIS — E559 Vitamin D deficiency, unspecified: Secondary | ICD-10-CM | POA: Diagnosis not present

## 2022-10-30 DIAGNOSIS — C9000 Multiple myeloma not having achieved remission: Secondary | ICD-10-CM | POA: Diagnosis not present

## 2022-10-30 DIAGNOSIS — Z0000 Encounter for general adult medical examination without abnormal findings: Secondary | ICD-10-CM | POA: Diagnosis not present

## 2022-10-30 DIAGNOSIS — G43909 Migraine, unspecified, not intractable, without status migrainosus: Secondary | ICD-10-CM | POA: Diagnosis not present

## 2022-11-03 ENCOUNTER — Encounter: Payer: Self-pay | Admitting: *Deleted

## 2022-11-03 ENCOUNTER — Other Ambulatory Visit: Payer: Self-pay

## 2022-11-03 ENCOUNTER — Inpatient Hospital Stay: Payer: Medicare Other | Attending: Hematology and Oncology

## 2022-11-03 DIAGNOSIS — C9001 Multiple myeloma in remission: Secondary | ICD-10-CM | POA: Insufficient documentation

## 2022-11-03 DIAGNOSIS — Z006 Encounter for examination for normal comparison and control in clinical research program: Secondary | ICD-10-CM | POA: Insufficient documentation

## 2022-11-03 LAB — CBC WITH DIFFERENTIAL (CANCER CENTER ONLY)
Abs Immature Granulocytes: 0.01 10*3/uL (ref 0.00–0.07)
Basophils Absolute: 0 10*3/uL (ref 0.0–0.1)
Basophils Relative: 1 %
Eosinophils Absolute: 0.1 10*3/uL (ref 0.0–0.5)
Eosinophils Relative: 2 %
HCT: 36.9 % (ref 36.0–46.0)
Hemoglobin: 11.7 g/dL — ABNORMAL LOW (ref 12.0–15.0)
Immature Granulocytes: 0 %
Lymphocytes Relative: 53 %
Lymphs Abs: 2 10*3/uL (ref 0.7–4.0)
MCH: 23.3 pg — ABNORMAL LOW (ref 26.0–34.0)
MCHC: 31.7 g/dL (ref 30.0–36.0)
MCV: 73.4 fL — ABNORMAL LOW (ref 80.0–100.0)
Monocytes Absolute: 0.6 10*3/uL (ref 0.1–1.0)
Monocytes Relative: 15 %
Neutro Abs: 1.1 10*3/uL — ABNORMAL LOW (ref 1.7–7.7)
Neutrophils Relative %: 29 %
Platelet Count: 226 10*3/uL (ref 150–400)
RBC: 5.03 MIL/uL (ref 3.87–5.11)
RDW: 15.4 % (ref 11.5–15.5)
WBC Count: 3.8 10*3/uL — ABNORMAL LOW (ref 4.0–10.5)
nRBC: 0 % (ref 0.0–0.2)

## 2022-11-03 NOTE — Research (Signed)
Research - CTSU ECOG-ACRIN [id number] Maintenance Cycle 101, Day 1  Patient into clinic unaccompanied today for evaluation prior to beginning treatment Cycle 101 of revlimid.  Patient was scheduled to start this cycle last week but her [place] was too low per protocol.  She returns today to re-check CBC and assess if she is okay to start cycle 101 this week.   Labs: CBC drawn per protocol.    Adverse Events Since Last Visit :  No changes since last week.   See AE table below.  Maintenance Cycle 100 09/30/22-11/03/22 (end of Cycle 100 = 09/98/3382) Solicited &/or Reportable Events Grade Attribution to lenalidomide Cycle # Comments  Anemia Grade 1 Definite 100    Hyperglycemia Grade 0 -      Lymphocyte count decreased Grade 0 -      Neutrophil count decreased Grade 3  Definite 100 10/28/22-Revlimid held x 1 week    Neutrophil count decreased Graded 2 Definite 100 11/03/22- Resume Revlimid 11/04/22  Platelet count decreased Grade 0 -      Diarrhea Grade 0 -      Dyspnea Grade 0 -      Edema: limbs Grade 0 -      Fatigue  Grade 2  Probable 100 Moderate, occasionally limiting ADLs (unchanged from previous)  Fever Grade 0 -      Insomnia  Grade 2 Unlikely 100    Irritability Grade 0 -      Muscle weakness trunk Grade 0 -      Nausea Grade 0 -       Peripheral motor neuropathy Grade 0 -      Peripheral sensory neuropathy  Grade 2 Unlikely 100 Moderate symptoms, unchanged from previous  Rash acneiform Grade 0  -      Vomiting Grade 0 -        Non-reportable AEs (unsolicited, < Grade 3): Decreased WBC count (grade 1) Moderate pain (grade 2)- Back ache and Left shoulder/arm  Mild pain (grade 1)- Bone  Migraines (grade 2)   Maintenance therapy with Revlimid:  Patient returned completed cycle 100 Medication Calendar, confirming dosing at dose level -3, Revlimid '5mg'$  every other day, Days 1-21 last week and she noted that no doses were missed. Patient confirms she has not taken any Revlimid in the  past 2 weeks.  Patient given printed appointment calendar with Cycle 101 treatment days marked, to document doses for the next treatment cycle, scheduled to begin tomorrow pending lab results. Patient prefers to start cycle on a Tuesday so she can keep her monthly visits on Tuesday mornings as this works best for her schedule.   Plan: Research nurse will call patient after review of today's lab results to notify if okay to start cycle 101 of Revlimid tomorrow.   Foye Spurling, BSN, RN, [place] Nurse II [phone] 11/03/2022 7:45 AM   Phone Call: [place] is 1.1 today which is within study parameters to start next cycle of Revlimid tomorrow as planned. Called patient and notified of [place] and other results are okay to start treatment tomorrow.  Informed patient her next lab appointment will be rescheduled for 12/02/22 and her other future appointments will also be adjusted accordingly.  Reminded patient to call our office if any new questions/concerns prior to next visit. She verbalized understanding.  Foye Spurling, BSN, RN, [place] Nurse II [phone] 11/03/2022 8:29 AM

## 2022-11-04 ENCOUNTER — Telehealth: Payer: Self-pay | Admitting: *Deleted

## 2022-11-05 NOTE — Telephone Encounter (Signed)
LVM for pt to call back to schedule an appt.  

## 2022-11-05 NOTE — Telephone Encounter (Signed)
PAP [employer] form information completed and faxed 11-04-2022. (With fax confirmation completed).  [phone].

## 2022-11-11 NOTE — Telephone Encounter (Signed)
Refaxed prescription to [facility] PAP. [phone].  Approved.

## 2022-11-16 ENCOUNTER — Other Ambulatory Visit: Payer: Self-pay | Admitting: Hematology and Oncology

## 2022-11-16 DIAGNOSIS — C9001 Multiple myeloma in remission: Secondary | ICD-10-CM

## 2022-11-19 ENCOUNTER — Other Ambulatory Visit: Payer: Self-pay

## 2022-11-19 DIAGNOSIS — C9001 Multiple myeloma in remission: Secondary | ICD-10-CM

## 2022-11-19 MED ORDER — LENALIDOMIDE 5 MG PO CAPS: ORAL_CAPSULE | ORAL | 0 refills | Status: DC

## 2022-11-19 NOTE — Telephone Encounter (Signed)
Called pt. Scheduled an appointment with Amy on 1/11 @ 9am.

## 2022-11-21 ENCOUNTER — Other Ambulatory Visit: Payer: Self-pay

## 2022-11-21 DIAGNOSIS — C9001 Multiple myeloma in remission: Secondary | ICD-10-CM

## 2022-11-21 MED ORDER — LENALIDOMIDE 5 MG PO CAPS: ORAL_CAPSULE | ORAL | 0 refills | Status: DC

## 2022-11-25 ENCOUNTER — Inpatient Hospital Stay: Payer: Medicare Other

## 2022-12-02 ENCOUNTER — Other Ambulatory Visit: Payer: Self-pay | Admitting: Neurology

## 2022-12-02 ENCOUNTER — Encounter: Payer: Self-pay | Admitting: *Deleted

## 2022-12-02 ENCOUNTER — Inpatient Hospital Stay: Payer: Medicare Other | Attending: Hematology and Oncology

## 2022-12-02 ENCOUNTER — Other Ambulatory Visit: Payer: Self-pay | Admitting: *Deleted

## 2022-12-02 DIAGNOSIS — Z79899 Other long term (current) drug therapy: Secondary | ICD-10-CM | POA: Insufficient documentation

## 2022-12-02 DIAGNOSIS — Z79890 Hormone replacement therapy (postmenopausal): Secondary | ICD-10-CM | POA: Insufficient documentation

## 2022-12-02 DIAGNOSIS — C9001 Multiple myeloma in remission: Secondary | ICD-10-CM | POA: Diagnosis present

## 2022-12-02 DIAGNOSIS — Z9221 Personal history of antineoplastic chemotherapy: Secondary | ICD-10-CM | POA: Insufficient documentation

## 2022-12-02 DIAGNOSIS — Z8739 Personal history of other diseases of the musculoskeletal system and connective tissue: Secondary | ICD-10-CM | POA: Insufficient documentation

## 2022-12-02 DIAGNOSIS — Z79620 Long term (current) use of immunosuppressive biologic: Secondary | ICD-10-CM | POA: Insufficient documentation

## 2022-12-02 DIAGNOSIS — Z7961 Long term (current) use of immunomodulator: Secondary | ICD-10-CM | POA: Insufficient documentation

## 2022-12-02 DIAGNOSIS — Z006 Encounter for examination for normal comparison and control in clinical research program: Secondary | ICD-10-CM | POA: Insufficient documentation

## 2022-12-02 DIAGNOSIS — D61811 Other drug-induced pancytopenia: Secondary | ICD-10-CM | POA: Insufficient documentation

## 2022-12-02 LAB — CBC WITH DIFFERENTIAL (CANCER CENTER ONLY)
Abs Immature Granulocytes: 0 10*3/uL (ref 0.00–0.07)
Basophils Absolute: 0 10*3/uL (ref 0.0–0.1)
Basophils Relative: 1 %
Eosinophils Absolute: 0.1 10*3/uL (ref 0.0–0.5)
Eosinophils Relative: 2 %
HCT: 36.7 % (ref 36.0–46.0)
Hemoglobin: 11.5 g/dL — ABNORMAL LOW (ref 12.0–15.0)
Immature Granulocytes: 0 %
Lymphocytes Relative: 50 %
Lymphs Abs: 2.2 10*3/uL (ref 0.7–4.0)
MCH: 23.1 pg — ABNORMAL LOW (ref 26.0–34.0)
MCHC: 31.3 g/dL (ref 30.0–36.0)
MCV: 73.8 fL — ABNORMAL LOW (ref 80.0–100.0)
Monocytes Absolute: 0.5 10*3/uL (ref 0.1–1.0)
Monocytes Relative: 13 %
Neutro Abs: 1.5 10*3/uL — ABNORMAL LOW (ref 1.7–7.7)
Neutrophils Relative %: 34 %
Platelet Count: 187 10*3/uL (ref 150–400)
RBC: 4.97 MIL/uL (ref 3.87–5.11)
RDW: 14.7 % (ref 11.5–15.5)
WBC Count: 4.3 10*3/uL (ref 4.0–10.5)
nRBC: 0 % (ref 0.0–0.2)

## 2022-12-02 NOTE — Research (Signed)
Research - CTSU ECOG-ACRIN [id number] Maintenance Cycle 102, Day 1  Patient into clinic unaccompanied today for evaluation prior to beginning treatment Cycle 102 of revlimid.     Labs: CBC drawn per protocol.    Adverse Events Since Last Visit :  Ongoing fatigue (grade 2) not limiting self-care. 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 Occasional Insomnia (grade 2) is ongoing and patient takes trazodone PRN.  Patient denies any nausea, vomiting, diarrhea, shortness of breath, swelling, fever, irritability, motor peripheral neuropathy or rash.  Other reported AEs include ongoing daily moderate back ache (grade 2) which she takes methocarbamol prn. She also reports occasional right shoulder pain (grade 2) which has limited her ability to swim this past month. Patient also reports occasional neck and generalized bone pain (grade 2) for which she also uses methocarbamol prn. Patient has not exercised this past month due to traveling and shoulder pain.  She feels her shoulder is better now and hopes to resume her swimming laps tomorrow.   See AE table below.   Maintenance Cycle 101 11/04/22-12/02/22 (end of Cycle 101 = 03/70/4888) Solicited &/or Reportable Events Grade Attribution to lenalidomide Cycle # Comments  Anemia Grade 1 Definite 101    Hyperglycemia Grade 0 -      Lymphocyte count decreased Grade 0 -      Neutrophil count decreased Grade 1  Definite 101     Platelet count decreased Grade 0 -      Diarrhea Grade 0 -      Dyspnea Grade 0 -      Edema: limbs Grade 0 -      Fatigue  Grade 2  Probable 101 Moderate, occasionally limiting ADLs (unchanged from previous)  Fever Grade 0 -      Insomnia  Grade 2 Unlikely 101    Irritability Grade 0 -      Muscle weakness trunk Grade 0 -      Nausea Grade 0 -       Peripheral motor neuropathy Grade 0 -      Peripheral sensory neuropathy  Grade 2 Unlikely 101 Moderate symptoms, unchanged from previous  Rash acneiform  Grade 0  -      Vomiting Grade 0 -        Non-reportable AEs (unsolicited, < Grade 3): Moderate pain (grade 2)- Back, Neck, Right shoulder/arm   Maintenance therapy with Revlimid:  Patient returned completed cycle 101 Medication Calendar, confirming dosing at dose level -3, Revlimid '5mg'$  every other day, Days 1-21 last week and she noted that no doses were missed. Patient went out of town to visit her mother 12/5-12/14 and did not write the time of her doses on the calendar during that time frame. Patient verbally confirmed she did take doses as directed and did not miss any doses this past cycle.  Patient given printed appointment calendar with Cycle 102 treatment days marked, to document doses for the next treatment cycle, scheduled to begin today pending lab results.    Plan: Research nurse will call patient after review of today's lab results to notify if okay to start cycle 102 of Revlimid tomorrow.   Foye Spurling, BSN, RN, [place] Nurse II [phone] 12/02/2022 7:45 AM   Phone Call: [place] is 1.5 today which is within study parameters to start next cycle of Revlimid today as planned. Called patient and notified of [place] and other results are okay to start treatment today.  Informed patient her next lab  appointment is in 3 weeks and Dr. Alvy Bimler in 4 weeks.  Research nurse will not see patient at next lab appointment but will notify her if CBC needs to be rechecked the next week when she sees Dr. Alvy Bimler.   Reminded patient to call our office if any new questions/concerns prior to next visit. She verbalized understanding.  Foye Spurling, BSN, RN, [place] Nurse II [phone] 12/02/2022 8:30 AM

## 2022-12-10 NOTE — Progress Notes (Signed)
Chief Complaint  Patient presents with   Follow-up    Rm 2, pt alone, overall stable and states things are going well. No concerns    HISTORY OF PRESENT ILLNESS:  12/11/22 ALL:  Carla Perry is a 67 y.o. female here today for follow up for migraines. She was last seen 08/2021 and doing well on Emgality. Venlafaxine and verapamil last prescribed by me 12/2019. Now filled with PCP. She reports headaches are very well managed. She may have 1-2 headaches per month. She uses Tramadol per PCP for abortive therapy/chronic pain relief.   She has not had any concerns of seizure activity since 2022. She has noted some difficulty with word recall and remembering names. She is able to complete ADLs independently. Drives without difficulty. She manages her meds as well as her husbands. She follows closely with PCP. A1C well managed, last 6.3. BP is usually well controlled. She continues asa and rosuvastatin for stroke prevention.   08/26/2021 AA (Mychart): Emgality is working great for her migraines, I gave her samples, we will leave her a few more samples until we can prescribe it for her, patient has chronic migraines, they are unilateral, pulsating pounding throbbing, photophobia phonophobia, nausea, was having more than 15 migraine days a month, that has been reduced to only several a month exceptional response. We also discussed MRI of the brain which showed a lacunar infarct remote but unclear how long, we discussed strokes, she states her hemoglobin A1c is controlled, will check her cholesterol panel, she will continue to take aspirin, discussed stroke risks. EEG was negative.   05/22/2021 AA: She hadn't smoked marijuana in 30 years and went to [place] and tried smoking, she took 5 puffs, she lost consciousness, she felt terrible, she has seizure-like activity, she had CPR, she couldn't move to drink water, she doesn't remember it, she had her arms flexed, no urination or tongue biting, was 25  minutes, she stopped breathing, they gave her mouth to mouth and cpr.    12/06/19 ALL (Mychart): Carla Perry is a 67 y.o. female here today for follow up for migraines and left sided paresthesias. She continues venlafaxine 150mg  and verapamil 120mg  daily. She reports increased frequency of migraines over the past year. She has a daily tension type headache. She notes that migraines occur 1-3 times per month.  She had a migraine in November "that was similar to a stroke." She had numbness of the left side of her body. She had left sided headache described as a pressure sensation. She denies weakness, confusion or speech changes. She took two tramadol tablets which helped to ease pain. Symptoms lasted about 5-6 hours and gradually eased off. She has a history of complicated migraines with similar symptoms. She was seen by oncology who sent her to the ER for evaluation. She had a CT of head that was normal. She continues as a participant in research trial for multiple myeloma. She is currently taking Revlimid daily.   History (copied from [facility] note on 11/03/2018)  Ms. Carla Perry is a 67 year old female with a history of migraine headaches and paresthesias on the left side.  She returns today for follow-up.  She states that she has not had a true headache since October.  When she does get a headache it does occur on the left side of the head she typically can take tramadol and the headache resolved quickly.  She states that she has ongoing numbness on the left side of the  body.  This is been present since she saw Dr. Sandria Manly.  The patient's work-up has been relatively unremarkable.  He denies any changes in her vision, speech denies any weakness.  She returns today for evaluation.   HISTORY( Copied from Dr. Trevor Mace note)  Interval history 06/25/2017: She has constant pressure on the left side of the head, she has 6/10 daily headache, migraines are on the left side, can last days with light and sound  sensitivity, nausea, no vomiting, pounding pain severe, no aura, no medication overuse. She takes 2 tramadol at the onset of migraine. Dr. Sandria Manly had put her on Verapamil and Effexor. She does feel that the Verapamil helps, if she doesn't take it she get a migraine.    Interval history 06/25/2016: She gets 3-4 migraines a month, they last an hour and go away with the tramadol. She still have low back pain and radiation into the legs. She has numbness and weakness in the legs intermittently but MRI of the neural axis was negative. Kneeling thing we didn't do was imaging MRI of the lumbar spine. Considering that she continues to have numbness and weakness in the legs will image MRI of the lumbar spine.   Interval update 03/18/2016: This is a lovely 67 year old female with a very complicated past medical history including hypertension, thyroid disorder, migraine, multiple myeloma s/p chemo currently participating in a research trial, neuropathy, insomnia, bone pain, back pain, joint pain, anemia, fatigue, leukopenia due to antineoplastic chemotherapy, right leg swelling, DVT, diabetes. I have seen patient in the past for chronic migraines but today she is here for a new problem, paresthesias that started February. Happens when waking up in the morning. Never happens otherwise. She feels an electrical current through her body with persistent tingling.  Starts in the thoracic or in the lumbar spine. Feels like electricity. Starts in the spine. She feels tingling all over, in the face, arms, legs from head to toe.  Will last several hours. A lot of times it is associated with a migraine. Nothing makes it better. Stretching aggravates the symptoms. She has pain in the spine associated with the paresthesias. Last night it started with numbness at 2am. She couldn't get back to sleep. She developed a headache. He still has residual headache and Her left eye still fells sore and head feels sore. Also a residual burning  feeling. Arms feel like they are burning. Episodes becoming more frequent. 6 episodes in Feb, 8 in march, 13 so far in April. Becoming more frequent and lasting longer. She experiences facial numbness with the episodes, burning and tingling in the spine that spreads to her whole body. The symptoms are usually associated with headache or migraine. Moving around helps with the symptoms. No weakness. No vision changes but does describe eye pain. The headaches are pressure and pain.she still gets the vertigo with the migraines. Headache left side of the head with the vertigo, no light or sound sensitivity. 5-10 minutes after the paresthesias she has the migraines/headaches. No inciting events. No trauma. She has low back pain, situated on the right side, radiates down the legs to the back of the legs, walking is not as good. Right leg goes numb. Paresthesias are more on the left.    Recent labs include normal B12. Normal TSH.   Interval update 06/19/2015: This is a former patient of Dr. Hosie Poisson who is seen in our office by our nurse practitioner Everlene Other. Patient is establishing care with me today. She is a  67 year old female with a history of chronic migraines. She was last seen in April. Today she returns and she is doing very well. She is on verapamil 120 mg extended release and venlafaxine XR 75 mg. She is doing very well. She has had maybe 3 migraines within the last few months. And they have been on the left side of the head with vertigo twice. When she has the vertigo, she has to lay in bed and sleep it off. But this is a significant improvement and she is very happy with this maintenance. She endorses nausea, photophobia and phonophobia. She takes tramadol for acute management. Discussed in detail that if she is doing well, we will keep her on her current migraine management. She can continue to follow up with Everlene Other as needed.   REVIEW OF SYSTEMS: Out of a complete 14 system review of  symptoms, the patient complains only of the following symptoms, headaches, memory loss and all other reviewed systems are negative.   ALLERGIES: Allergies  Allergen Reactions   Metformin Diarrhea     HOME MEDICATIONS: Outpatient Medications Prior to Visit  Medication Sig Dispense Refill   aspirin 325 MG tablet Take 325 mg by mouth daily.      Cholecalciferol (VITAMIN D3) 2000 UNITS TABS Take 2,000 Units by mouth daily.     dorzolamide-timolol (COSOPT) 22.3-6.8 MG/ML ophthalmic solution Place 1 drop into both eyes 2 (two) times daily.     Galcanezumab-gnlm (EMGALITY) 120 MG/ML SOAJ INJECT 120MG  (1 PEN) UNDER THE SKIN EVERY 4 WEEKS. 1 mL 0   hydrochlorothiazide (HYDRODIURIL) 25 MG tablet Take 25 mg by mouth every evening.      lenalidomide (REVLIMID) 5 MG capsule TAKE 1 CAPSULE BY MOUTH EVERY  OTHER DAY FOR 21 DAYS ON THEN 7  DAYS OFF 11 capsule 0   levothyroxine (SYNTHROID, LEVOTHROID) 75 MCG tablet Take 75 mcg by mouth daily before breakfast.     methocarbamol (ROBAXIN) 500 MG tablet Take 1 tablet (500 mg total) by mouth every 8 (eight) hours as needed for muscle spasms. 90 tablet 1   Multiple Vitamins-Minerals (CENTRUM SILVER PO) Take 1 tablet by mouth daily.      polyethylene glycol (MIRALAX / GLYCOLAX) packet Take 17 g by mouth daily as needed for mild constipation.      RESTASIS 0.05 % ophthalmic emulsion PLACE 1 DROP IN BOTH EYES IN THE EVENING  4   rosuvastatin (CRESTOR) 5 MG tablet Take 5 mg by mouth daily at 2 PM.     sitaGLIPtin (JANUVIA) 50 MG tablet Take 50 mg by mouth daily.     traMADol (ULTRAM) 50 MG tablet Take by mouth every 6 (six) hours as needed.     traZODone (DESYREL) 50 MG tablet TAKE 1 TABLET (50 MG TOTAL) BY MOUTH AT BEDTIME. 90 tablet 3   venlafaxine XR (EFFEXOR-XR) 150 MG 24 hr capsule Take 1 capsule (150 mg total) by mouth daily with breakfast. 90 capsule 3   verapamil (VERELAN PM) 120 MG 24 hr capsule Take 1 capsule (120 mg total) by mouth daily. 90 capsule 3    Facility-Administered Medications Prior to Visit  Medication Dose Route Frequency Provider Last Rate Last Admin   gadopentetate dimeglumine (MAGNEVIST) injection 15 mL  15 mL Intravenous Once PRN Anson Fret, MD         PAST MEDICAL HISTORY: Past Medical History:  Diagnosis Date   Abnormal thyroid function test 08/30/2014   Anemia    Anemia, unspecified 10/06/2013  Bone pain 10/21/2013   Bronchitis 01/31/2015   Diabetes mellitus without complication (HCC) 08/2014   Steroid induced diabetes. has not picked oral med up from pharmacy as of 09-14-14   Diverticulitis 07/18/2014   DVT (deep venous thrombosis) (HCC) 02/07/2014   HBP (high blood pressure)    Insomnia 02/11/2016   Leukopenia due to antineoplastic chemotherapy (HCC) 12/27/2013   Memory loss    MGUS (monoclonal gammopathy of unknown significance)    MGUS (monoclonal gammopathy of unknown significance) 10/06/2013   Migraine    Multiple myeloma, without mention of having achieved remission 11/16/2013   Pancytopenia (HCC) 12/27/2015   Peripheral neuropathy 12/27/2015   Personal history of chemotherapy    Right leg swelling 02/07/2014   Seizure (HCC) 1960   single seizure episode at age 63   Thyroid disorder    Vitamin D deficiency 10/24/2014     PAST SURGICAL HISTORY: Past Surgical History:  Procedure Laterality Date   HEMORRHOID SURGERY     PORT-A-CATH REMOVAL  10-2014   PORTACATH PLACEMENT Right jan 2015     FAMILY HISTORY: Family History  Problem Relation Age of Onset   Throat cancer Father    Stroke Father    Cancer Brother        prostate   Breast cancer Neg Hx      SOCIAL HISTORY: Social History   Socioeconomic History   Marital status: Married    Spouse name: Merton Border   Number of children: 2   Years of education: 14   Highest education level: Not on file  Occupational History    Employer: [employer]  Tobacco Use   Smoking status: Never   Smokeless tobacco: Never  Vaping Use   Vaping Use:  Never used  Substance and Sexual Activity   Alcohol use: No    Alcohol/week: 0.0 standard drinks of alcohol   Drug use: No   Sexual activity: Not on file  Other Topics Concern   Not on file  Social History Narrative   Patient lives at home with her husband Systems developer). Patient has two years college.   Right handed.   Caffeine- None   Social Determinants of Health   Financial Resource Strain: Not on file  Food Insecurity: Not on file  Transportation Needs: Not on file  Physical Activity: Not on file  Stress: Not on file  Social Connections: Not on file  Intimate Partner Violence: Not on file     PHYSICAL EXAM  Vitals:   12/11/22 0848  BP: 139/76  Pulse: (!) 57  Weight: 158 lb (71.7 kg)  Height: 5\' 7"  (1.702 m)   Body mass index is 24.75 kg/m.  Generalized: Well developed, in no acute distress  Cardiology: normal rate and rhythm, no murmur auscultated  Respiratory: clear to auscultation bilaterally    Neurological examination  Mentation: Alert oriented to time, place, history taking. Follows all commands speech and language fluent Cranial nerve II-XII: Pupils were equal round reactive to light. Extraocular movements were full, visual field were full on confrontational test. Facial sensation and strength were normal. Head turning and shoulder shrug  were normal and symmetric. Motor: The motor testing reveals 5 over 5 strength of all 4 extremities. Good symmetric motor tone is noted throughout.  Gait and station: Gait is normal.    DIAGNOSTIC DATA (LABS, IMAGING, TESTING) - I reviewed patient records, labs, notes, testing and imaging myself where available.  Lab Results  Component Value Date   WBC 4.3 12/02/2022   HGB  11.5 (L) 12/02/2022   HCT 36.7 12/02/2022   MCV 73.8 (L) 12/02/2022   PLT 187 12/02/2022      Component Value Date/Time   NA 139 09/23/2022 0729   NA 141 09/29/2017 0830   K 3.7 09/23/2022 0729   K 3.6 09/29/2017 0830   CL 104 09/23/2022 0729    CO2 28 09/23/2022 0729   CO2 29 09/29/2017 0830   GLUCOSE 84 09/23/2022 0729   GLUCOSE 87 09/29/2017 0830   BUN 12 09/23/2022 0729   BUN 14.0 09/29/2017 0830   CREATININE 0.92 09/23/2022 0729   CREATININE 0.9 09/29/2017 0830   CALCIUM 9.2 09/23/2022 0729   CALCIUM 9.6 09/29/2017 0830   PROT 7.7 09/23/2022 0729   PROT 7.3 09/29/2017 0832   PROT 8.0 09/29/2017 0830   ALBUMIN 3.9 09/23/2022 0729   ALBUMIN 3.8 09/29/2017 0830   AST 21 09/23/2022 0729   AST 21 09/29/2017 0830   ALT 21 09/23/2022 0729   ALT 69 (H) 09/29/2017 0830   ALKPHOS 49 09/23/2022 0729   ALKPHOS 60 09/29/2017 0830   BILITOT 0.6 09/23/2022 0729   BILITOT 0.46 09/29/2017 0830   GFRNONAA >60 09/23/2022 0729   GFRAA >60 07/31/2020 0737   Lab Results  Component Value Date   CHOL 112 08/28/2021   HDL 56 08/28/2021   LDLCALC 36 08/28/2021   TRIG 109 08/28/2021   CHOLHDL 2.0 08/28/2021   No results found for: "HGBA1C" Lab Results  Component Value Date   VITAMINB12 642 04/27/2018   Lab Results  Component Value Date   TSH 1.733 09/23/2022        No data to display               No data to display           ASSESSMENT AND PLAN  67 y.o. year old female  has a past medical history of Abnormal thyroid function test (08/30/2014), Anemia, Anemia, unspecified (10/06/2013), Bone pain (10/21/2013), Bronchitis (01/31/2015), Diabetes mellitus without complication (HCC) (08/2014), Diverticulitis (07/18/2014), DVT (deep venous thrombosis) (HCC) (02/07/2014), HBP (high blood pressure), Insomnia (02/11/2016), Leukopenia due to antineoplastic chemotherapy (HCC) (12/27/2013), Memory loss, MGUS (monoclonal gammopathy of unknown significance), MGUS (monoclonal gammopathy of unknown significance) (10/06/2013), Migraine, Multiple myeloma, without mention of having achieved remission (11/16/2013), Pancytopenia (HCC) (12/27/2015), Peripheral neuropathy (12/27/2015), Personal history of chemotherapy, Right leg swelling  (02/07/2014), Seizure (HCC) (1960), Thyroid disorder, and Vitamin D deficiency (10/24/2014). here with    Chronic migraine without aura without status migrainosus, not intractable  Carla Perry is doing well. Headaches are well managed on Emgality. She will continue injections every 30 days. She receives medication through [facility] Will send rx. She will continue tramadol, venlafaxine and verapamil per PCP.  No recent seizure activity. We have discussed memory concerns. She was encouraged to continue monitoring. May consider MMSE/MOCA in future versus neurocog eval if she wishes. Memory compensation strategies reviewed. Continue close follow up with Dr Donette Larry for stroke prevention and co morbidity management. Healthy lifestyle habits encouraged. She will follow up with PCP as directed. She will return to see me in 1 year, sooner if needed. She verbalizes understanding and agreement with this plan.   No orders of the defined types were placed in this encounter.    No orders of the defined types were placed in this encounter.    Shawnie Dapper, MSN, FNP-C 12/11/2022, 9:07 AM  [facility] [address], [address] [place], [place] [address] [phone]

## 2022-12-10 NOTE — Patient Instructions (Signed)
Below is our plan:  We will continue Emgality every month. Continue venlafaxine and verapamil per Dr Donette Larry.   Please make sure you are staying well hydrated. I recommend 50-60 ounces daily. Well balanced diet and regular exercise encouraged. Consistent sleep schedule with 6-8 hours recommended.   Please continue follow up with care team as directed.   Follow up with me in 1 year   You may receive a survey regarding today's visit. I encourage you to leave honest feed back as I do use this information to improve patient care. Thank you for seeing me today!   Management of Memory Problems   There are some general things you can do to help manage your memory problems.  Your memory may not in fact recover, but by using techniques and strategies you will be able to manage your memory difficulties better.   1)  Establish a routine. Try to establish and then stick to a regular routine.  By doing this, you will get used to what to expect and you will reduce the need to rely on your memory.  Also, try to do things at the same time of day, such as taking your medication or checking your calendar first thing in the morning. Think about think that you can do as a part of a regular routine and make a list.  Then enter them into a daily planner to remind you.  This will help you establish a routine.   2)  Organize your environment. Organize your environment so that it is uncluttered.  Decrease visual stimulation.  Place everyday items such as keys or cell phone in the same place every day (ie.  Basket next to front door) Use post it notes with a brief message to yourself (ie. Turn off light, lock the door) Use labels to indicate where things go (ie. Which cupboards are for food, dishes, etc.) Keep a notepad and pen by the telephone to take messages   3)  Memory Aids A diary or journal/notebook/daily planner Making a list (shopping list, chore list, to do list that needs to be done) Using an alarm as a  reminder (kitchen timer or cell phone alarm) Using cell phone to store information (Notes, Calendar, Reminders) Calendar/White board placed in a prominent position Post-it notes   In order for memory aids to be useful, you need to have good habits.  It's no good remembering to make a note in your journal if you don't remember to look in it.  Try setting aside a certain time of day to look in journal.   4)  Improving mood and managing fatigue. There may be other factors that contribute to memory difficulties.  Factors, such as anxiety, depression and tiredness can affect memory. Regular gentle exercise can help improve your mood and give you more energy. Exercise: there are short videos created by the [employer] on Health specially for older adults: https://bit.ly/2I30q97.  Mediterranean diet: which emphasizes fruits, vegetables, whole grains, legumes, fish, and other seafood; unsaturated fats such as olive oils; and low amounts of red meat, eggs, and sweets. A variation of this, called MIND (Mediterranean-DASH Intervention for Neurodegenerative Delay) incorporates the DASH (Dietary Approaches to Stop Hypertension) diet, which has been shown to lower high blood pressure, a risk factor for Alzheimer's disease. More information at: ExitMarketing.de.  Aerobic exercise that improve heart health is also good for the mind.  [employer] on Aging have short videos for exercises that you can do at home: BlindWorkshop.com.pt Simple  relaxation techniques may help relieve symptoms of anxiety Try to get back to completing activities or hobbies you enjoyed doing in the past. Learn to pace yourself through activities to decrease fatigue. Find out about some local support groups where you can share experiences with others. Try and achieve 7-8 hours of sleep at night.

## 2022-12-11 ENCOUNTER — Ambulatory Visit: Payer: Medicare Other | Admitting: Family Medicine

## 2022-12-11 ENCOUNTER — Telehealth: Payer: Self-pay | Admitting: *Deleted

## 2022-12-11 ENCOUNTER — Encounter: Payer: Self-pay | Admitting: Family Medicine

## 2022-12-11 VITALS — BP 139/76 | HR 57 | Ht 67.0 in | Wt 158.0 lb

## 2022-12-11 DIAGNOSIS — G43709 Chronic migraine without aura, not intractable, without status migrainosus: Secondary | ICD-10-CM | POA: Diagnosis not present

## 2022-12-11 MED ORDER — EMGALITY 120 MG/ML ~~LOC~~ SOAJ: SUBCUTANEOUS | 3 refills | Status: DC

## 2022-12-11 NOTE — Telephone Encounter (Signed)
It's [facility]. I sent more refills there.

## 2022-12-11 NOTE — Telephone Encounter (Signed)
-----   Message from Debbora Presto, NP sent at 12/11/2022  9:09 AM EST ----- Hey guys. She gets Emgality free through patient assistance. Can you check to see where we need to send rx? TY!

## 2022-12-16 ENCOUNTER — Inpatient Hospital Stay: Payer: Medicare Other

## 2022-12-18 ENCOUNTER — Other Ambulatory Visit: Payer: Self-pay

## 2022-12-18 DIAGNOSIS — C9001 Multiple myeloma in remission: Secondary | ICD-10-CM

## 2022-12-18 MED ORDER — LENALIDOMIDE 5 MG PO CAPS: ORAL_CAPSULE | ORAL | 0 refills | Status: DC

## 2022-12-23 ENCOUNTER — Telehealth: Payer: Self-pay | Admitting: *Deleted

## 2022-12-23 ENCOUNTER — Inpatient Hospital Stay: Payer: Medicare Other | Admitting: Hematology and Oncology

## 2022-12-23 ENCOUNTER — Inpatient Hospital Stay: Payer: Medicare Other

## 2022-12-23 DIAGNOSIS — C9001 Multiple myeloma in remission: Secondary | ICD-10-CM

## 2022-12-23 DIAGNOSIS — Z006 Encounter for examination for normal comparison and control in clinical research program: Secondary | ICD-10-CM | POA: Diagnosis not present

## 2022-12-23 LAB — CBC WITH DIFFERENTIAL (CANCER CENTER ONLY)
Abs Immature Granulocytes: 0 10*3/uL (ref 0.00–0.07)
Basophils Absolute: 0 10*3/uL (ref 0.0–0.1)
Basophils Relative: 1 %
Eosinophils Absolute: 0.1 10*3/uL (ref 0.0–0.5)
Eosinophils Relative: 4 %
HCT: 37.6 % (ref 36.0–46.0)
Hemoglobin: 11.8 g/dL — ABNORMAL LOW (ref 12.0–15.0)
Immature Granulocytes: 0 %
Lymphocytes Relative: 49 %
Lymphs Abs: 1.4 10*3/uL (ref 0.7–4.0)
MCH: 23.1 pg — ABNORMAL LOW (ref 26.0–34.0)
MCHC: 31.4 g/dL (ref 30.0–36.0)
MCV: 73.6 fL — ABNORMAL LOW (ref 80.0–100.0)
Monocytes Absolute: 0.4 10*3/uL (ref 0.1–1.0)
Monocytes Relative: 13 %
Neutro Abs: 0.9 10*3/uL — ABNORMAL LOW (ref 1.7–7.7)
Neutrophils Relative %: 33 %
Platelet Count: 214 10*3/uL (ref 150–400)
RBC: 5.11 MIL/uL (ref 3.87–5.11)
RDW: 14.6 % (ref 11.5–15.5)
WBC Count: 2.8 10*3/uL — ABNORMAL LOW (ref 4.0–10.5)
nRBC: 0 % (ref 0.0–0.2)

## 2022-12-23 LAB — CMP (CANCER CENTER ONLY)
ALT: 23 U/L (ref 0–44)
AST: 21 U/L (ref 15–41)
Albumin: 3.8 g/dL (ref 3.5–5.0)
Alkaline Phosphatase: 52 U/L (ref 38–126)
Anion gap: 6 (ref 5–15)
BUN: 11 mg/dL (ref 8–23)
CO2: 29 mmol/L (ref 22–32)
Calcium: 9 mg/dL (ref 8.9–10.3)
Chloride: 103 mmol/L (ref 98–111)
Creatinine: 0.89 mg/dL (ref 0.44–1.00)
GFR, Estimated: >60 mL/min (ref >60)
Glucose, Bld: 77 mg/dL (ref 70–99)
Potassium: 3.7 mmol/L (ref 3.5–5.1)
Sodium: 138 mmol/L (ref 135–145)
Total Bilirubin: 0.6 mg/dL (ref 0.3–1.2)
Total Protein: 7.1 g/dL (ref 6.5–8.1)

## 2022-12-23 LAB — LACTATE DEHYDROGENASE: LDH: 148 U/L (ref 98–192)

## 2022-12-23 LAB — TSH: TSH: 1.613 u[IU]/mL (ref 0.350–4.500)

## 2022-12-23 NOTE — Telephone Encounter (Signed)
[id number] Study: Patient had labs drawn today one week prior to MD appointment next week in order to have results available for that visit.  CBC today shows ANC 0.9 and it needs to be at least 1.0 for patient to start next cycle of Revlimid. Added lab appointment for next Tuesday 12/30/22 prior to MD visit to recheck the CBC. Informed patient of need for repeat lab next week and to arrive before 8:15 am for lab appointment. She verbalized understanding.  Foye Spurling, BSN, RN, [place] Nurse II [phone] 12/23/2022 1:30 PM

## 2022-12-24 LAB — KAPPA/LAMBDA LIGHT CHAINS
Kappa free light chain: 33.3 mg/L — ABNORMAL HIGH (ref 3.3–19.4)
Kappa, lambda light chain ratio: 1.69 — ABNORMAL HIGH (ref 0.26–1.65)
Lambda free light chains: 19.7 mg/L (ref 5.7–26.3)

## 2022-12-30 ENCOUNTER — Inpatient Hospital Stay: Payer: Medicare Other | Admitting: Hematology and Oncology

## 2022-12-30 ENCOUNTER — Inpatient Hospital Stay: Payer: Medicare Other

## 2022-12-30 ENCOUNTER — Encounter: Payer: Self-pay | Admitting: *Deleted

## 2022-12-30 ENCOUNTER — Encounter: Payer: Self-pay | Admitting: Hematology and Oncology

## 2022-12-30 VITALS — BP 130/71 | HR 58 | Temp 97.4°F | Resp 18 | Ht 67.0 in | Wt 160.0 lb

## 2022-12-30 DIAGNOSIS — Z006 Encounter for examination for normal comparison and control in clinical research program: Secondary | ICD-10-CM

## 2022-12-30 DIAGNOSIS — C9001 Multiple myeloma in remission: Secondary | ICD-10-CM

## 2022-12-30 DIAGNOSIS — D61811 Other drug-induced pancytopenia: Secondary | ICD-10-CM | POA: Diagnosis not present

## 2022-12-30 LAB — CBC WITH DIFFERENTIAL (CANCER CENTER ONLY)
Abs Immature Granulocytes: 0 10*3/uL (ref 0.00–0.07)
Basophils Absolute: 0 10*3/uL (ref 0.0–0.1)
Basophils Relative: 1 %
Eosinophils Absolute: 0.1 10*3/uL (ref 0.0–0.5)
Eosinophils Relative: 2 %
HCT: 37.6 % (ref 36.0–46.0)
Hemoglobin: 11.8 g/dL — ABNORMAL LOW (ref 12.0–15.0)
Immature Granulocytes: 0 %
Lymphocytes Relative: 57 %
Lymphs Abs: 2 10*3/uL (ref 0.7–4.0)
MCH: 23.1 pg — ABNORMAL LOW (ref 26.0–34.0)
MCHC: 31.4 g/dL (ref 30.0–36.0)
MCV: 73.6 fL — ABNORMAL LOW (ref 80.0–100.0)
Monocytes Absolute: 0.5 10*3/uL (ref 0.1–1.0)
Monocytes Relative: 14 %
Neutro Abs: 0.9 10*3/uL — ABNORMAL LOW (ref 1.7–7.7)
Neutrophils Relative %: 26 %
Platelet Count: 189 10*3/uL (ref 150–400)
RBC: 5.11 MIL/uL (ref 3.87–5.11)
RDW: 14.8 % (ref 11.5–15.5)
WBC Count: 3.5 10*3/uL — ABNORMAL LOW (ref 4.0–10.5)
nRBC: 0 % (ref 0.0–0.2)

## 2022-12-30 NOTE — Progress Notes (Signed)
[place] OFFICE PROGRESS NOTE  Patient Care Team: Wenda Low, MD as PCP - General (Internal Medicine) Heath Lark, MD as Consulting Physician (Hematology and Oncology)  ASSESSMENT & PLAN:  Multiple myeloma in remission [facility]) Her recent myeloma panel is pending Her slightly elevated free light chains and LDH are unrelated The patient is comfortable to remain on Revlimid indefinitely. She will continue treatment per research protocol She will continue calcium with vitamin D supplement and aspirin for DVT prophylaxis She is not on Zometa due to history of osteonecrosis of the jaw   Drug-induced pancytopenia ([place]) This is due to her treatment She is not symptomatic Observe  No orders of the defined types were placed in this encounter.   All questions were answered. The patient knows to call the clinic with any problems, questions or concerns. The total time spent in the appointment was 20 minutes encounter with patients including review of chart and various tests results, discussions about plan of care and coordination of care plan   Heath Lark, MD 12/30/2022 8:48 AM  INTERVAL HISTORY: Please see below for problem oriented charting. she returns for treatment follow-up on maintenance Revlimid per protocol She has no recent infection She has no new side effects from treatment No bone pain  REVIEW OF SYSTEMS:   Constitutional: Denies fevers, chills or abnormal weight loss Eyes: Denies blurriness of vision Ears, nose, mouth, throat, and face: Denies mucositis or sore throat Respiratory: Denies cough, dyspnea or wheezes Cardiovascular: Denies palpitation, chest discomfort or lower extremity swelling Gastrointestinal:  Denies nausea, heartburn or change in bowel habits Skin: Denies abnormal skin rashes Lymphatics: Denies new lymphadenopathy or easy bruising Neurological:Denies numbness, tingling or new weaknesses Behavioral/Psych: Mood is stable, no new  changes  All other systems were reviewed with the patient and are negative.  I have reviewed the past medical history, past surgical history, social history and family history with the patient and they are unchanged from previous note.  ALLERGIES:  is allergic to metformin.  MEDICATIONS:  Current Outpatient Medications  Medication Sig Dispense Refill   aspirin 325 MG tablet Take 325 mg by mouth daily.      Cholecalciferol (VITAMIN D3) 2000 UNITS TABS Take 2,000 Units by mouth daily.     dorzolamide-timolol (COSOPT) 22.3-6.8 MG/ML ophthalmic solution Place 1 drop into both eyes 2 (two) times daily.     Galcanezumab-gnlm (EMGALITY) 120 MG/ML SOAJ INJECT '120MG'$  (1 PEN) UNDER THE SKIN EVERY 4 WEEKS. 3 mL 3   hydrochlorothiazide (HYDRODIURIL) 25 MG tablet Take 25 mg by mouth every evening.      lenalidomide (REVLIMID) 5 MG capsule TAKE 1 CAPSULE BY MOUTH EVERY  OTHER DAY FOR 21 DAYS ON THEN 7  DAYS OFF 11 capsule 0   levothyroxine (SYNTHROID, LEVOTHROID) 75 MCG tablet Take 75 mcg by mouth daily before breakfast.     methocarbamol (ROBAXIN) 500 MG tablet Take 1 tablet (500 mg total) by mouth every 8 (eight) hours as needed for muscle spasms. 90 tablet 1   Multiple Vitamins-Minerals (CENTRUM SILVER PO) Take 1 tablet by mouth daily.      polyethylene glycol (MIRALAX / GLYCOLAX) packet Take 17 g by mouth daily as needed for mild constipation.      RESTASIS 0.05 % ophthalmic emulsion PLACE 1 DROP IN BOTH EYES IN THE EVENING  4   rosuvastatin (CRESTOR) 5 MG tablet Take 5 mg by mouth daily at 2 PM.     sitaGLIPtin (JANUVIA) 50 MG tablet Take 50  mg by mouth daily.     traMADol (ULTRAM) 50 MG tablet Take by mouth every 6 (six) hours as needed.     traZODone (DESYREL) 50 MG tablet TAKE 1 TABLET (50 MG TOTAL) BY MOUTH AT BEDTIME. 90 tablet 3   venlafaxine XR (EFFEXOR-XR) 150 MG 24 hr capsule Take 1 capsule (150 mg total) by mouth daily with breakfast. 90 capsule 3   verapamil (VERELAN PM) 120 MG 24 hr  capsule Take 1 capsule (120 mg total) by mouth daily. 90 capsule 3   No current facility-administered medications for this visit.   Facility-Administered Medications Ordered in Other Visits  Medication Dose Route Frequency Provider Last Rate Last Admin   gadopentetate dimeglumine (MAGNEVIST) injection 15 mL  15 mL Intravenous Once PRN Melvenia Beam, MD        SUMMARY OF ONCOLOGIC HISTORY: Oncology History Overview Note  ISS stage 1 IgG lambda subtype (serum albumin 3.6, Beta2 microglobulin 2.32) Durie Salmon Stage 1   Multiple myeloma in remission ([place])  10/10/2013 Imaging   Skeletal survery was negative   11/09/2013 Bone Marrow Biopsy   BM biopsy confirmed myeloma, 76% involved, IgG lambda subtype   12/06/2013 - 08/29/2014 Chemotherapy   Sh received chemo with revlimid, Velcade, Dexamethasone and Zometa. Patient particpated in clinical research [facility] [phone]   02/23/2014 Bone Marrow Biopsy   Repeat bone marrow biopsy showed 5% involvement   03/31/2014 Adverse Reaction   Zometa was discontinued due to osteonecrosis of the jaw.   05/05/2014 Imaging   Imaging study of the neck showed no explanation that could cause right neck pain. She is noted to have incidental left upper lung nodule. Plan to repeat imaging study in 3 months.   09/06/2014 Imaging   Bone survey showed no evidence of fracture   09/14/2014 Bone Marrow Biopsy   Bone marrow biopsy showed 8% residual plasma cells by manual count but none on the biopsy specimen   09/26/2014 -  Chemotherapy   She is started on cycle 1 of maintenance Revlimid   01/31/2015 Imaging    chest x-ray showed pneumonia. Treatment was placed on hold.   05/03/2015 Bone Marrow Biopsy   Accession: [id number] repeat bone marrow aspirate and biopsy show 5% residual plasma cells   10/14/2016 Bone Marrow Biopsy   Bone marrow biopsy showed the plasma cells represent 4% of all cells with lack of large aggregates or sheets. To assess the plasma cell  clonality, immunohistochemical stains is performed and it lack clonality. Normal cytogenetics and FISH   01/09/2017 Imaging   CT chest showed ground-glass 1.5 cm apical left upper lobe pulmonary nodule. Initial follow-up with CT at 6-12 months is recommended to confirm persistence. If persistent, repeat CT is recommended every 2 years until 5 years of stability has been established. This recommendation follows the consensus statement: Guidelines for Management of Incidental Pulmonary Nodules Detected on CT Images: From the Fleischner Society 2017; Radiology 2017; 284:228-243. 2. Mild patchy ground-glass opacities in the right upper lobe, probably inflammatory, which can also be reassessed on follow-up chest CT performed for the above dominant ground-glass nodule. 3. Solitary 3 mm right lower lobe solid pulmonary nodule, which can also be reassessed on follow-up chest CT performed for the above dominant ground-glass nodule. 4. No thoracic adenopathy. 5. Aortic atherosclerosis.  Two-vessel coronary atherosclerosis.   06/30/2017 Imaging   1. No interval change in the 11 x 13 mm ground-glass nodule left upper lobe. Given the nearly 6 months of imaging stability, repeat CT  is recommended every 2 years until 5 years of stability has been established. This recommendation follows the consensus statement: Guidelines for Management of Incidental Pulmonary Nodules Detected on CT Images: From the Fleischner Society 2017; Radiology 2017; 284:228-243. 2. Interval resolution of the tiny patchy ground-glass nodules right upper lobe, likely infectious/inflammatory etiology. 3. Stable 3 mm right lower lobe pulmonary nodule. Attention on follow-up recommended. 4.  Aortic Atherosclerois (ICD10-170.0)   09/29/2017 Imaging   Skeletal survey 1. Questionable new tiny lucency noted the posterior portion of C3 vertebral body. This may represent a small lytic lesion.  2. No definite thoracic lesion noted on today's exam.  Stable lucencies in the left ilium and acetabulum.  3. No other focal abnormalities identified. The left hip is unremarkable .   04/16/2021 - 08/05/2022 Chemotherapy   Patient is on Treatment Plan : MYELOMA Research CTSU [id number] Arm D Maintenance Lenalidomide q28d     06/17/2022 Imaging   There are no focal lytic lesions. Other findings as described in the body of the report. Overall, no significant interval changes are noted since 07/31/2020.       PHYSICAL EXAMINATION: ECOG PERFORMANCE STATUS: 0 - Asymptomatic  Vitals:   12/30/22 0826  BP: 130/71  Pulse: (!) 58  Resp: 18  Temp: (!) 97.4 F (36.3 C)  SpO2: 100%   Filed Weights   12/30/22 0826  Weight: 160 lb (72.6 kg)    GENERAL:alert, no distress and comfortable SKIN: skin color, texture, turgor are normal, no rashes or significant lesions EYES: normal, Conjunctiva are pink and non-injected, sclera clear OROPHARYNX:no exudate, no erythema and lips, buccal mucosa, and tongue normal  NECK: supple, thyroid normal size, non-tender, without nodularity LYMPH:  no palpable lymphadenopathy in the cervical, axillary or inguinal LUNGS: clear to auscultation and percussion with normal breathing effort HEART: regular rate & rhythm and no murmurs and no lower extremity edema ABDOMEN:abdomen soft, non-tender and normal bowel sounds Musculoskeletal:no cyanosis of digits and no clubbing  NEURO: alert & oriented x 3 with fluent speech, no focal motor/sensory deficits  LABORATORY DATA:  I have reviewed the data as listed    Component Value Date/Time   NA 138 12/23/2022 0731   NA 141 09/29/2017 0830   K 3.7 12/23/2022 0731   K 3.6 09/29/2017 0830   CL 103 12/23/2022 0731   CO2 29 12/23/2022 0731   CO2 29 09/29/2017 0830   GLUCOSE 77 12/23/2022 0731   GLUCOSE 87 09/29/2017 0830   BUN 11 12/23/2022 0731   BUN 14.0 09/29/2017 0830   CREATININE 0.89 12/23/2022 0731   CREATININE 0.9 09/29/2017 0830   CALCIUM 9.0 12/23/2022 0731    CALCIUM 9.6 09/29/2017 0830   PROT 7.1 12/23/2022 0731   PROT 7.3 09/29/2017 0832   PROT 8.0 09/29/2017 0830   ALBUMIN 3.8 12/23/2022 0731   ALBUMIN 3.8 09/29/2017 0830   AST 21 12/23/2022 0731   AST 21 09/29/2017 0830   ALT 23 12/23/2022 0731   ALT 69 (H) 09/29/2017 0830   ALKPHOS 52 12/23/2022 0731   ALKPHOS 60 09/29/2017 0830   BILITOT 0.6 12/23/2022 0731   BILITOT 0.46 09/29/2017 0830   GFRNONAA >60 12/23/2022 0731   GFRAA >60 07/31/2020 0737    No results found for: "SPEP", "UPEP"  Lab Results  Component Value Date   WBC 3.5 (L) 12/30/2022   NEUTROABS 0.9 (L) 12/30/2022   HGB 11.8 (L) 12/30/2022   HCT 37.6 12/30/2022   MCV 73.6 (L) 12/30/2022   PLT 189  12/30/2022      Chemistry      Component Value Date/Time   NA 138 12/23/2022 0731   NA 141 09/29/2017 0830   K 3.7 12/23/2022 0731   K 3.6 09/29/2017 0830   CL 103 12/23/2022 0731   CO2 29 12/23/2022 0731   CO2 29 09/29/2017 0830   BUN 11 12/23/2022 0731   BUN 14.0 09/29/2017 0830   CREATININE 0.89 12/23/2022 0731   CREATININE 0.9 09/29/2017 0830      Component Value Date/Time   CALCIUM 9.0 12/23/2022 0731   CALCIUM 9.6 09/29/2017 0830   ALKPHOS 52 12/23/2022 0731   ALKPHOS 60 09/29/2017 0830   AST 21 12/23/2022 0731   AST 21 09/29/2017 0830   ALT 23 12/23/2022 0731   ALT 69 (H) 09/29/2017 0830   BILITOT 0.6 12/23/2022 0731   BILITOT 0.46 09/29/2017 0830

## 2022-12-30 NOTE — Research (Signed)
Research - CTSU ECOG-ACRIN [id number] Maintenance Cycle 103, Day 1  Patient into clinic unaccompanied today for evaluation prior to beginning treatment Cycle 103 of revlimid.     Labs w/myeloma panel: Blood drawn on 12/23/2022 within 7-day window per protocol, to allow adequate time for results prior to treatment. Unfortunately, Myeloma panel was not resulted by this appointment. Communication with [facility] revealed it may be a few more days before results are available.   Results will be reviewed by Dr. Alvy Bimler when they become available.  CBC results from this morning show ANC 0.9 which does not meet parameter to start next cycle of Revlimid today.  Per protocol, patient is to return next week to recheck CBC prior to starting next cycle of Revlimid.  Patient verbalized understanding to not start Revlimid until instructed after she is has another CBC and assessed again next week. Dr. Alvy Bimler is in agreement with this plan.   Maintenance therapy with Revlimid:  Patient returned completed cycle 102 Medication Calendar, confirming dosing at dose level -3, Revlimid '5mg'$  every other day, Days 1-21. Patient notes that no doses were missed.   History and physical exam: See MD note dated today.      Adverse Events:  Patient reported the following since last visit; Ongoing fatigue (grade 2) not limiting self-care. 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 Occasional Insomnia (grade 2) is ongoing and patient takes trazodone PRN.  Patient denies any nausea, vomiting, diarrhea, shortness of breath, swelling, fever, irritability, motor peripheral neuropathy or rash.  Other reported AEs include ongoing daily moderate back ache (grade 2) which she takes tramadol prn. She does not report any shoulder, neck or back pain this past month. Patient has exercised this past month swimming laps at the pool 3 days per week. See AE table below.  Maintenance Cycles 100-102 09/30/22-12/30/2022 (end of  Cycle 756 = 4/33/2951) Solicited &/or Reportable Events Grade Attribution to lenalidomide Cycle # Comments  Anemia Grade 1 Definite 100,101, 102    Hyperglycemia Grade 0 -      Lymphocyte count decreased Grade 0 -      Neutrophil count decreased Grade 3 Definite 100, 102   Neutrophil count decreased Grade 2 Definite 100   Neutrophil count decreased Grade 1  Definite 101     Platelet count decreased Grade 0 -      Diarrhea Grade 0 -      Dyspnea Grade 0 -      Edema: limbs Grade 0 -      Fatigue  Grade 2  Probable 100,101, 102 Moderate, occasionally limiting ADLs (unchanged from previous)  Fever Grade 0 -      Insomnia  Grade 2 Unlikely 100,101, 102    Irritability Grade 0 -      Muscle weakness trunk Grade 0 -      Nausea Grade 0 -       Peripheral motor neuropathy Grade 0 -      Peripheral sensory neuropathy  Grade 2 Unlikely 100,101, 102 Moderate symptoms, unchanged from previous  Rash acneiform Grade 0  -      Vomiting Grade 0 -        Non-reportable AEs (unsolicited, < Grade 3): Moderate pain (grade 2)- Back, Neck, Bone, Bilateral shoulder/arm Migraines (grade 2) Decreased WBC (grade 1)    Plan:  Lab will be scheduled for next week on 01/06/23 to repeat CBC prior to starting next cycle of Revlimid. In the future, Dr. Alvy Bimler instructed  for myeloma panel to be drawn at least 10 days prior to next MD visit to allow time for results to be available for MD visit.  Reminded patient to call our office if any new questions/concerns prior to next visit. She verbalized understanding.   Foye Spurling, BSN, RN, [place] Nurse II [phone] 12/30/2022

## 2022-12-30 NOTE — Assessment & Plan Note (Signed)
Her recent myeloma panel is pending Her slightly elevated free light chains and LDH are unrelated The patient is comfortable to remain on Revlimid indefinitely. 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

## 2022-12-30 NOTE — Assessment & Plan Note (Signed)
This is due to her treatment She is not symptomatic Observe

## 2022-12-31 LAB — MULTIPLE MYELOMA PANEL, SERUM
Albumin SerPl Elph-Mcnc: 3.9 g/dL (ref 2.9–4.4)
Albumin/Glob SerPl: 1.3 (ref 0.7–1.7)
Alpha 1: 0.2 g/dL (ref 0.0–0.4)
Alpha2 Glob SerPl Elph-Mcnc: 0.5 g/dL (ref 0.4–1.0)
B-Globulin SerPl Elph-Mcnc: 0.9 g/dL (ref 0.7–1.3)
Gamma Glob SerPl Elph-Mcnc: 0.6 g/dL (ref 0.4–1.8)
Globulin, Total: 3.2 g/dL (ref 2.2–3.9)
IgA: 129 mg/dL (ref 87–352)
IgG (Immunoglobin G), Serum: 1853 mg/dL — ABNORMAL HIGH (ref 586–1602)
IgM (Immunoglobulin M), Srm: 61 mg/dL (ref 26–217)
Total Protein ELP: 7.1 g/dL (ref 6.0–8.5)

## 2023-01-06 ENCOUNTER — Encounter: Payer: Self-pay | Admitting: *Deleted

## 2023-01-06 ENCOUNTER — Inpatient Hospital Stay: Payer: Medicare Other | Attending: Hematology and Oncology

## 2023-01-06 DIAGNOSIS — Z006 Encounter for examination for normal comparison and control in clinical research program: Secondary | ICD-10-CM | POA: Diagnosis present

## 2023-01-06 DIAGNOSIS — C9001 Multiple myeloma in remission: Secondary | ICD-10-CM | POA: Diagnosis present

## 2023-01-06 LAB — CBC WITH DIFFERENTIAL (CANCER CENTER ONLY)
Abs Immature Granulocytes: 0 10*3/uL (ref 0.00–0.07)
Basophils Absolute: 0 10*3/uL (ref 0.0–0.1)
Basophils Relative: 1 %
Eosinophils Absolute: 0.1 10*3/uL (ref 0.0–0.5)
Eosinophils Relative: 2 %
HCT: 36.5 % (ref 36.0–46.0)
Hemoglobin: 11.4 g/dL — ABNORMAL LOW (ref 12.0–15.0)
Immature Granulocytes: 0 %
Lymphocytes Relative: 54 %
Lymphs Abs: 1.7 10*3/uL (ref 0.7–4.0)
MCH: 22.6 pg — ABNORMAL LOW (ref 26.0–34.0)
MCHC: 31.2 g/dL (ref 30.0–36.0)
MCV: 72.4 fL — ABNORMAL LOW (ref 80.0–100.0)
Monocytes Absolute: 0.4 10*3/uL (ref 0.1–1.0)
Monocytes Relative: 14 %
Neutro Abs: 0.9 10*3/uL — ABNORMAL LOW (ref 1.7–7.7)
Neutrophils Relative %: 29 %
Platelet Count: 213 10*3/uL (ref 150–400)
RBC: 5.04 MIL/uL (ref 3.87–5.11)
RDW: 15 % (ref 11.5–15.5)
WBC Count: 3.2 10*3/uL — ABNORMAL LOW (ref 4.0–10.5)
nRBC: 0 % (ref 0.0–0.2)

## 2023-01-06 NOTE — Research (Signed)
Research - CTSU ECOG-ACRIN [id number] Maintenance Cycle 103, Day 1  Delayed by One Week   Patient into clinic unaccompanied today for repeat CBC prior to beginning treatment Cycle 103 of revlimid. [place] at last week's visit did not meet parameters to start this cycle of revlimid.   Labs w/myeloma panel: Myeloma panel drawn on 12/23/2022 resulted and reviewed by Dr. Alvy Bimler. Review of myeloma panel shows evidence of continued Complete Response.  Dr. Alvy Bimler states that the elevated kappa, lambda light chain ratio is not clinically significant and does not indicate disease relapse.  Per Dr. Alvy Bimler, bone marrow biopsy and aspirate would not be considered standard of care at this time. Dr. Alvy Bimler also states that 24 hr urine is not required to assess disease response.   Adverse Events:  Patient denies any changes since visit last week.  See AE table below.  Maintenance Cycles 100-102 09/30/22-01/06/2023 (end of Cycle 332 = 08/06/1883) Solicited &/or Reportable Events Grade Attribution to lenalidomide Cycle # Comments  Anemia Grade 1 Definite 100,101, 102    Hyperglycemia Grade 0 -      Lymphocyte count decreased Grade 0 -      Neutrophil count decreased Grade 3 Definite 100, 102   Neutrophil count decreased Grade 2 Definite 100   Neutrophil count decreased Grade 1  Definite 101     Platelet count decreased Grade 0 -      Diarrhea Grade 0 -      Dyspnea Grade 0 -      Edema: limbs Grade 0 -      Fatigue  Grade 2  Probable 100,101, 102 Moderate, occasionally limiting ADLs (unchanged from previous)  Fever Grade 0 -      Insomnia  Grade 2 Unlikely 100,101, 102    Irritability Grade 0 -      Muscle weakness trunk Grade 0 -      Nausea Grade 0 -       Peripheral motor neuropathy Grade 0 -      Peripheral sensory neuropathy  Grade 2 Unlikely 100,101, 102 Moderate symptoms, unchanged from previous  Rash acneiform Grade 0  -      Vomiting Grade 0 -        Non-reportable AEs (unsolicited, < Grade  3): Moderate pain (grade 2)- Back, Neck, Bone, Bilateral shoulder/arm Migraines (grade 2) Decreased WBC (grade 1)    Plan:  Research nurse will call patient after review of today's lab results to notify if okay to start cycle 103 of Revlimid today.   Grade 3 Neutropenia  Informed Dr. Alvy Bimler of [place] 0.9 and she agrees it is appropriate to continue to hold revlimid and recheck CBC in one week per protocol and based on previous guidance from study chair regarding grade 3 neutropenia.   Phone Call: Notified patient of [place] result and instructed to hold revlimid and return to clinic in one week to recheck CBC.  Lab appointment scheduled for 01/13/23.   Patient verbalized understanding.  Foye Spurling, BSN, RN, [place] Nurse II [phone] 01/06/2023

## 2023-01-13 ENCOUNTER — Encounter: Payer: Self-pay | Admitting: *Deleted

## 2023-01-13 ENCOUNTER — Inpatient Hospital Stay: Payer: Medicare Other

## 2023-01-13 ENCOUNTER — Telehealth: Payer: Self-pay | Admitting: Hematology and Oncology

## 2023-01-13 DIAGNOSIS — Z006 Encounter for examination for normal comparison and control in clinical research program: Secondary | ICD-10-CM | POA: Diagnosis present

## 2023-01-13 DIAGNOSIS — C9001 Multiple myeloma in remission: Secondary | ICD-10-CM

## 2023-01-13 LAB — CBC WITH DIFFERENTIAL (CANCER CENTER ONLY)
Abs Immature Granulocytes: 0 10*3/uL (ref 0.00–0.07)
Basophils Absolute: 0 10*3/uL (ref 0.0–0.1)
Basophils Relative: 1 %
Eosinophils Absolute: 0.1 10*3/uL (ref 0.0–0.5)
Eosinophils Relative: 1 %
HCT: 36.4 % (ref 36.0–46.0)
Hemoglobin: 11.5 g/dL — ABNORMAL LOW (ref 12.0–15.0)
Immature Granulocytes: 0 %
Lymphocytes Relative: 53 %
Lymphs Abs: 1.9 10*3/uL (ref 0.7–4.0)
MCH: 23 pg — ABNORMAL LOW (ref 26.0–34.0)
MCHC: 31.6 g/dL (ref 30.0–36.0)
MCV: 72.8 fL — ABNORMAL LOW (ref 80.0–100.0)
Monocytes Absolute: 0.4 10*3/uL (ref 0.1–1.0)
Monocytes Relative: 12 %
Neutro Abs: 1.2 10*3/uL — ABNORMAL LOW (ref 1.7–7.7)
Neutrophils Relative %: 33 %
Platelet Count: 231 10*3/uL (ref 150–400)
RBC: 5 MIL/uL (ref 3.87–5.11)
RDW: 14.8 % (ref 11.5–15.5)
WBC Count: 3.7 10*3/uL — ABNORMAL LOW (ref 4.0–10.5)
nRBC: 0 % (ref 0.0–0.2)

## 2023-01-13 NOTE — Research (Signed)
Research - CTSU ECOG-ACRIN [id number] Maintenance Cycle 103, Day 1  Delayed by Two Weeks  Patient into clinic unaccompanied today for repeat CBC prior to beginning treatment Cycle 103 of revlimid. [place] at last week's visit did not meet parameters to start this cycle of revlimid.   Lab: CBC drawn per protocol.   Adverse Events:  Patient denies any changes since visit last week.  See AE table below.  Maintenance Cycles 100-102 09/30/22-01/13/2023 (end of Cycle [id number] = [id number]) Solicited &/or Reportable Events Grade Attribution to lenalidomide Cycle # Comments  Anemia Grade 1 Definite 100,101, 102    Hyperglycemia Grade 0 -      Lymphocyte count decreased Grade 0 -      Neutrophil count decreased Grade 3 Definite 100, 102   Neutrophil count decreased Grade 2 Definite 100, 102   Neutrophil count decreased Grade 1  Definite 101     Platelet count decreased Grade 0 -      Diarrhea Grade 0 -      Dyspnea Grade 0 -      Edema: limbs Grade 0 -      Fatigue  Grade 2  Probable 100,101, 102 Moderate, occasionally limiting ADLs (unchanged from previous)  Fever Grade 0 -      Insomnia  Grade 2 Unlikely 100,101, 102    Irritability Grade 0 -      Muscle weakness trunk Grade 0 -      Nausea Grade 0 -       Peripheral motor neuropathy Grade 0 -      Peripheral sensory neuropathy  Grade 2 Unlikely 100,101, 102 Moderate symptoms, unchanged from previous  Rash acneiform Grade 0  -      Vomiting Grade 0 -        Non-reportable AEs (unsolicited, < Grade 3): Moderate pain (grade 2)- Back, Neck, Bone, Bilateral shoulder/arm Migraines (grade 2) Decreased WBC (grade 1)    Plan:  Research nurse will call patient after review of today's lab results to notify if okay to start cycle 103 of Revlimid today.   Phone Call: Notified patient's husband of [place] result okay for patient to start next cycle of revlimid today and we will make lab appointment to return in 4 weeks on 02/10/23 prior to next cycle. Asked him to  have patient call if there are any questions and he verbalized understanding.     Foye Spurling, BSN, RN, [place] Nurse II [phone] 01/13/2023

## 2023-01-13 NOTE — Telephone Encounter (Signed)
Scheduled per IB 01/13/23. Patient is aware of date and time of appointment

## 2023-01-15 ENCOUNTER — Other Ambulatory Visit: Payer: Self-pay | Admitting: Hematology and Oncology

## 2023-01-15 DIAGNOSIS — C9001 Multiple myeloma in remission: Secondary | ICD-10-CM

## 2023-02-03 DIAGNOSIS — H401111 Primary open-angle glaucoma, right eye, mild stage: Secondary | ICD-10-CM | POA: Diagnosis not present

## 2023-02-03 DIAGNOSIS — H40022 Open angle with borderline findings, high risk, left eye: Secondary | ICD-10-CM | POA: Diagnosis not present

## 2023-02-03 DIAGNOSIS — H524 Presbyopia: Secondary | ICD-10-CM | POA: Diagnosis not present

## 2023-02-03 DIAGNOSIS — E119 Type 2 diabetes mellitus without complications: Secondary | ICD-10-CM | POA: Diagnosis not present

## 2023-02-03 DIAGNOSIS — H25813 Combined forms of age-related cataract, bilateral: Secondary | ICD-10-CM | POA: Diagnosis not present

## 2023-02-06 ENCOUNTER — Other Ambulatory Visit: Payer: Self-pay

## 2023-02-06 DIAGNOSIS — C9001 Multiple myeloma in remission: Secondary | ICD-10-CM

## 2023-02-06 MED ORDER — LENALIDOMIDE 5 MG PO CAPS: ORAL_CAPSULE | ORAL | 0 refills | Status: DC

## 2023-02-10 ENCOUNTER — Encounter: Payer: Self-pay | Admitting: *Deleted

## 2023-02-10 ENCOUNTER — Inpatient Hospital Stay: Payer: Medicare Other | Attending: Hematology and Oncology

## 2023-02-10 DIAGNOSIS — C9001 Multiple myeloma in remission: Secondary | ICD-10-CM | POA: Diagnosis not present

## 2023-02-10 LAB — CBC WITH DIFFERENTIAL (CANCER CENTER ONLY)
Abs Immature Granulocytes: 0 10*3/uL (ref 0.00–0.07)
Basophils Absolute: 0 10*3/uL (ref 0.0–0.1)
Basophils Relative: 1 %
Eosinophils Absolute: 0.1 10*3/uL (ref 0.0–0.5)
Eosinophils Relative: 1 %
HCT: 35.5 % — ABNORMAL LOW (ref 36.0–46.0)
Hemoglobin: 11.2 g/dL — ABNORMAL LOW (ref 12.0–15.0)
Immature Granulocytes: 0 %
Lymphocytes Relative: 55 %
Lymphs Abs: 2.1 10*3/uL (ref 0.7–4.0)
MCH: 23 pg — ABNORMAL LOW (ref 26.0–34.0)
MCHC: 31.5 g/dL (ref 30.0–36.0)
MCV: 73 fL — ABNORMAL LOW (ref 80.0–100.0)
Monocytes Absolute: 0.6 10*3/uL (ref 0.1–1.0)
Monocytes Relative: 16 %
Neutro Abs: 1.1 10*3/uL — ABNORMAL LOW (ref 1.7–7.7)
Neutrophils Relative %: 27 %
Platelet Count: 221 10*3/uL (ref 150–400)
RBC: 4.86 MIL/uL (ref 3.87–5.11)
RDW: 15.3 % (ref 11.5–15.5)
WBC Count: 3.8 10*3/uL — ABNORMAL LOW (ref 4.0–10.5)
nRBC: 0 % (ref 0.0–0.2)

## 2023-02-10 NOTE — Research (Signed)
Research - CTSU ECOG-ACRIN [id number] Maintenance Cycle 104, Day 1   Patient into clinic unaccompanied today for CBC and toxicity assessment prior to beginning treatment Cycle 104 of revlimid.    Lab: CBC drawn per protocol.   Adverse Events:  Patient reported the following since last visit; Ongoing fatigue (grade 2) not limiting self-care. 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 Occasional Insomnia (grade 2) is ongoing and patient takes trazodone PRN.  Patient denies any nausea, vomiting, diarrhea, shortness of breath, swelling, fever, irritability, motor peripheral neuropathy or rash.  Other reported AEs include ongoing daily moderate back ache (grade 2) which she takes tramadol prn. She also reports one day of moderate left arm and hand pain (grade 2), relieved with tramadol this past month. Patient has exercised this past month swimming laps at the pool average of 3 days per week. See AE table below.  Maintenance Cycle 103 01/13/23-02/10/23 (end of Cycle [id number] = 0000000) Solicited &/or Reportable Events Grade Attribution to lenalidomide Cycle # Comments  Anemia Grade 1 Definite  103    Hyperglycemia Grade 0 -      Lymphocyte count decreased Grade 0 -      Neutrophil count decreased Grade 2 Definite 103   Platelet count decreased Grade 0 -      Diarrhea Grade 0 -      Dyspnea Grade 0 -      Edema: limbs Grade 0 -      Fatigue  Grade 2  Probable  103 Moderate, occasionally limiting ADLs (unchanged from previous)  Fever Grade 0 -      Insomnia  Grade 2 Unlikely 103    Irritability Grade 0 -      Muscle weakness trunk Grade 0 -      Nausea Grade 0 -       Peripheral motor neuropathy Grade 0 -      Peripheral sensory neuropathy  Grade 2 Unlikely  103 Moderate symptoms, unchanged from previous  Rash acneiform Grade 0  -      Vomiting Grade 0 -        Non-reportable AEs (unsolicited, < Grade 3): Moderate pain (grade 2)- Back, Left arm/shoulder Decreased WBC  (grade 1)    Plan:  Research nurse will call patient after review of today's lab results to notify if okay to start cycle 104 of Revlimid today.   Phone Call: Notified patient [place] result is okay to start next cycle of revlimid today and we will make lab appointment to return in 4 weeks on 03/10/23 prior to next cycle. Reminded patient to call if any questions or concerns prior to next visit and she verbalized understanding.     Foye Spurling, BSN, RN, [place] Nurse II [phone] 02/10/2023

## 2023-03-01 ENCOUNTER — Other Ambulatory Visit: Payer: Self-pay | Admitting: Hematology and Oncology

## 2023-03-01 DIAGNOSIS — C9001 Multiple myeloma in remission: Secondary | ICD-10-CM

## 2023-03-05 ENCOUNTER — Other Ambulatory Visit: Payer: Self-pay

## 2023-03-05 ENCOUNTER — Telehealth: Payer: Self-pay | Admitting: *Deleted

## 2023-03-05 DIAGNOSIS — C9001 Multiple myeloma in remission: Secondary | ICD-10-CM

## 2023-03-05 MED ORDER — LENALIDOMIDE 5 MG PO CAPS: ORAL_CAPSULE | ORAL | 0 refills | Status: DC

## 2023-03-05 NOTE — Telephone Encounter (Signed)
Patient states [facility] needs the Rx with the [employer] authorization number to refill patient's Revlimid. LVM for collaborative nurse.  Foye Spurling, BSN, RN, [place] Nurse II [phone] 03/05/2023 11:34 AM

## 2023-03-05 NOTE — Telephone Encounter (Signed)
Revlimid Rx sent to [place].

## 2023-03-10 ENCOUNTER — Inpatient Hospital Stay: Payer: Medicare Other | Attending: Hematology and Oncology

## 2023-03-10 ENCOUNTER — Telehealth: Payer: Self-pay | Admitting: Hematology and Oncology

## 2023-03-10 ENCOUNTER — Encounter: Payer: Self-pay | Admitting: *Deleted

## 2023-03-10 DIAGNOSIS — C9001 Multiple myeloma in remission: Secondary | ICD-10-CM | POA: Diagnosis not present

## 2023-03-10 DIAGNOSIS — Z79899 Other long term (current) drug therapy: Secondary | ICD-10-CM | POA: Diagnosis not present

## 2023-03-10 LAB — CBC WITH DIFFERENTIAL (CANCER CENTER ONLY)
Abs Immature Granulocytes: 0 10*3/uL (ref 0.00–0.07)
Basophils Absolute: 0 10*3/uL (ref 0.0–0.1)
Basophils Relative: 1 %
Eosinophils Absolute: 0.1 10*3/uL (ref 0.0–0.5)
Eosinophils Relative: 2 %
HCT: 36.8 % (ref 36.0–46.0)
Hemoglobin: 11.6 g/dL — ABNORMAL LOW (ref 12.0–15.0)
Immature Granulocytes: 0 %
Lymphocytes Relative: 55 %
Lymphs Abs: 2.1 10*3/uL (ref 0.7–4.0)
MCH: 22.9 pg — ABNORMAL LOW (ref 26.0–34.0)
MCHC: 31.5 g/dL (ref 30.0–36.0)
MCV: 72.6 fL — ABNORMAL LOW (ref 80.0–100.0)
Monocytes Absolute: 0.5 10*3/uL (ref 0.1–1.0)
Monocytes Relative: 13 %
Neutro Abs: 1.1 10*3/uL — ABNORMAL LOW (ref 1.7–7.7)
Neutrophils Relative %: 29 %
Platelet Count: 193 10*3/uL (ref 150–400)
RBC: 5.07 MIL/uL (ref 3.87–5.11)
RDW: 15.5 % (ref 11.5–15.5)
WBC Count: 3.9 10*3/uL — ABNORMAL LOW (ref 4.0–10.5)
nRBC: 0 % (ref 0.0–0.2)

## 2023-03-10 NOTE — Research (Signed)
Research - [facility] ECOG-ACRIN [id number] Maintenance Cycle 105, Day 1   Patient into clinic unaccompanied today for CBC and toxicity assessment prior to beginning treatment Cycle 105 of revlimid.    Lab: CBC drawn per protocol.   Adverse Events:  Patient reported the following since last visit; Ongoing fatigue (grade 2) not limiting self-care. 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 Patient denies any nausea, vomiting, diarrhea, shortness of breath, swelling, fever, irritability, motor peripheral neuropathy,insomnia or rash in the past month.  Other reported AEs include ongoing daily moderate back ache (grade 2) which she takes tramadol and methocarbamol prn. Migraines (grade 2) x 3 episodes for which she is taking tramadol prn and emgality monthly.  Patient continues to exercise swimming laps at the pool average of 3 days per week. See AE table below.  Maintenance Cycle 104 02/10/23-03/10/23 (end of Cycle 104 = 03/09/2023) Solicited &/or Reportable Events Grade Attribution to lenalidomide Cycle # Comments  Anemia Grade 1 Definite  104    Hyperglycemia Grade 0 -      Lymphocyte count decreased Grade 0 -      Neutrophil count decreased Grade 2 Definite 104   Platelet count decreased Grade 0 -      Diarrhea Grade 0 -      Dyspnea Grade 0 -      Edema: limbs Grade 0 -      Fatigue  Grade 2  Probable  104 Moderate, occasionally limiting ADLs (unchanged from previous)  Fever Grade 0 -      Insomnia  Grade 0 -     Irritability Grade 0 -      Muscle weakness trunk Grade 0 -      Nausea Grade 0 -       Peripheral motor neuropathy Grade 0 -      Peripheral sensory neuropathy  Grade 2 Unlikely  104 Moderate symptoms, unchanged from previous  Rash acneiform Grade 0  -      Vomiting Grade 0 -        Non-reportable AEs (unsolicited, < Grade 3): Moderate pain (grade 2)- Back Decreased WBC (grade 1)  Migraines (grade 2)   Plan:  Research nurse will call patient after  review of today's lab results to notify if okay to start cycle 105 of Revlimid today.   Phone Call: Notified patient ANC result is okay to start next cycle of revlimid today and we will make lab appointment to return in 3 weeks on 03/31/23 for multiple myeloma panel and other labs prior to MD appointment the following week on 04/07/23.  Reminded patient to call if any questions or concerns prior to next visit and she verbalized understanding.     Domenica Reamer, BSN, RN, [employer] Research Nurse II [phone] 03/10/2023 8:02 AM

## 2023-03-10 NOTE — Telephone Encounter (Signed)
Patient aware of appointments  

## 2023-03-12 DIAGNOSIS — H40022 Open angle with borderline findings, high risk, left eye: Secondary | ICD-10-CM | POA: Diagnosis not present

## 2023-03-12 DIAGNOSIS — H401111 Primary open-angle glaucoma, right eye, mild stage: Secondary | ICD-10-CM | POA: Diagnosis not present

## 2023-03-24 ENCOUNTER — Other Ambulatory Visit: Payer: Self-pay | Admitting: Internal Medicine

## 2023-03-24 DIAGNOSIS — Z1231 Encounter for screening mammogram for malignant neoplasm of breast: Secondary | ICD-10-CM

## 2023-03-29 ENCOUNTER — Other Ambulatory Visit: Payer: Self-pay | Admitting: Hematology and Oncology

## 2023-03-29 DIAGNOSIS — C9001 Multiple myeloma in remission: Secondary | ICD-10-CM

## 2023-03-31 ENCOUNTER — Telehealth: Payer: Self-pay | Admitting: *Deleted

## 2023-03-31 ENCOUNTER — Inpatient Hospital Stay: Payer: Medicare Other

## 2023-03-31 DIAGNOSIS — Z79899 Other long term (current) drug therapy: Secondary | ICD-10-CM | POA: Diagnosis not present

## 2023-03-31 DIAGNOSIS — C9001 Multiple myeloma in remission: Secondary | ICD-10-CM | POA: Diagnosis not present

## 2023-03-31 LAB — CBC WITH DIFFERENTIAL (CANCER CENTER ONLY)
Abs Immature Granulocytes: 0.02 10*3/uL (ref 0.00–0.07)
Basophils Absolute: 0 10*3/uL (ref 0.0–0.1)
Basophils Relative: 1 %
Eosinophils Absolute: 0.1 10*3/uL (ref 0.0–0.5)
Eosinophils Relative: 3 %
HCT: 36 % (ref 36.0–46.0)
Hemoglobin: 11.2 g/dL — ABNORMAL LOW (ref 12.0–15.0)
Immature Granulocytes: 1 %
Lymphocytes Relative: 51 %
Lymphs Abs: 1.8 10*3/uL (ref 0.7–4.0)
MCH: 22.6 pg — ABNORMAL LOW (ref 26.0–34.0)
MCHC: 31.1 g/dL (ref 30.0–36.0)
MCV: 72.6 fL — ABNORMAL LOW (ref 80.0–100.0)
Monocytes Absolute: 0.5 10*3/uL (ref 0.1–1.0)
Monocytes Relative: 14 %
Neutro Abs: 1.1 10*3/uL — ABNORMAL LOW (ref 1.7–7.7)
Neutrophils Relative %: 30 %
Platelet Count: 202 10*3/uL (ref 150–400)
RBC: 4.96 MIL/uL (ref 3.87–5.11)
RDW: 15.7 % — ABNORMAL HIGH (ref 11.5–15.5)
Smear Review: NORMAL
WBC Count: 3.5 10*3/uL — ABNORMAL LOW (ref 4.0–10.5)
nRBC: 0 % (ref 0.0–0.2)

## 2023-03-31 LAB — CMP (CANCER CENTER ONLY)
ALT: 21 U/L (ref 0–44)
AST: 20 U/L (ref 15–41)
Albumin: 3.9 g/dL (ref 3.5–5.0)
Alkaline Phosphatase: 49 U/L (ref 38–126)
Anion gap: 4 — ABNORMAL LOW (ref 5–15)
BUN: 10 mg/dL (ref 8–23)
CO2: 29 mmol/L (ref 22–32)
Calcium: 9.3 mg/dL (ref 8.9–10.3)
Chloride: 106 mmol/L (ref 98–111)
Creatinine: 0.88 mg/dL (ref 0.44–1.00)
GFR, Estimated: >60 mL/min (ref >60)
Glucose, Bld: 86 mg/dL (ref 70–99)
Potassium: 3.8 mmol/L (ref 3.5–5.1)
Sodium: 139 mmol/L (ref 135–145)
Total Bilirubin: 0.8 mg/dL (ref 0.3–1.2)
Total Protein: 7.2 g/dL (ref 6.5–8.1)

## 2023-03-31 LAB — LACTATE DEHYDROGENASE: LDH: 143 U/L (ref 98–192)

## 2023-03-31 LAB — TSH: TSH: 1.205 u[IU]/mL (ref 0.350–4.500)

## 2023-03-31 NOTE — Telephone Encounter (Signed)
[id number]: Informed patient that CBC results within parameters for next cycle of revlimid to start next week after she sees Dr. Bertis Ruddy. There is no need to repeat CBC prior to next week's MD appointment. She verbalized understanding.  Domenica Reamer, BSN, RN, [employer] Research Nurse II [phone] 03/31/2023 9:55 AM

## 2023-04-01 LAB — KAPPA/LAMBDA LIGHT CHAINS
Kappa free light chain: 32.9 mg/L — ABNORMAL HIGH (ref 3.3–19.4)
Kappa, lambda light chain ratio: 1.65 (ref 0.26–1.65)
Lambda free light chains: 19.9 mg/L (ref 5.7–26.3)

## 2023-04-03 ENCOUNTER — Other Ambulatory Visit: Payer: Self-pay

## 2023-04-03 ENCOUNTER — Telehealth: Payer: Self-pay | Admitting: *Deleted

## 2023-04-03 DIAGNOSIS — C9001 Multiple myeloma in remission: Secondary | ICD-10-CM

## 2023-04-03 MED ORDER — LENALIDOMIDE 5 MG PO CAPS
ORAL_CAPSULE | ORAL | 0 refills | Status: DC
Start: 2023-04-03 — End: 2023-04-28

## 2023-04-03 NOTE — Telephone Encounter (Signed)
Patient requests refill for Revlimid be sent to pharmacy.  Next cycle due to start on Tuesday 04/07/23. Informed patient research nurse will contact collaborative nurse for refill. Patient confirmed visit on Tuesday.  Domenica Reamer, BSN, RN, [employer] Research Nurse II [phone] 04/03/2023 1:39 PM

## 2023-04-06 LAB — MULTIPLE MYELOMA PANEL, SERUM
Albumin SerPl Elph-Mcnc: 3.4 g/dL (ref 2.9–4.4)
Albumin/Glob SerPl: 1.1 (ref 0.7–1.7)
Alpha 1: 0.2 g/dL (ref 0.0–0.4)
Alpha2 Glob SerPl Elph-Mcnc: 0.6 g/dL (ref 0.4–1.0)
B-Globulin SerPl Elph-Mcnc: 1 g/dL (ref 0.7–1.3)
Gamma Glob SerPl Elph-Mcnc: 1.6 g/dL (ref 0.4–1.8)
Globulin, Total: 3.4 g/dL (ref 2.2–3.9)
IgA: 109 mg/dL (ref 87–352)
IgG (Immunoglobin G), Serum: 1736 mg/dL — ABNORMAL HIGH (ref 586–1602)
IgM (Immunoglobulin M), Srm: 52 mg/dL (ref 26–217)
Total Protein ELP: 6.8 g/dL (ref 6.0–8.5)

## 2023-04-07 ENCOUNTER — Encounter: Payer: Self-pay | Admitting: *Deleted

## 2023-04-07 ENCOUNTER — Inpatient Hospital Stay: Payer: Medicare Other | Attending: Hematology and Oncology | Admitting: Hematology and Oncology

## 2023-04-07 ENCOUNTER — Encounter: Payer: Self-pay | Admitting: Hematology and Oncology

## 2023-04-07 VITALS — BP 144/72 | HR 56 | Temp 98.3°F | Resp 18 | Ht 67.0 in | Wt 164.2 lb

## 2023-04-07 DIAGNOSIS — Z006 Encounter for examination for normal comparison and control in clinical research program: Secondary | ICD-10-CM | POA: Diagnosis not present

## 2023-04-07 DIAGNOSIS — C9001 Multiple myeloma in remission: Secondary | ICD-10-CM

## 2023-04-07 DIAGNOSIS — M62838 Other muscle spasm: Secondary | ICD-10-CM | POA: Diagnosis not present

## 2023-04-07 NOTE — Research (Signed)
Research - [facility] ECOG-ACRIN [id number] Maintenance Cycle 106, Day 1  Patient into clinic unaccompanied today for evaluation prior to beginning treatment Cycle 106 of revlimid.     Labs w/myeloma panel: Blood drawn on 03/31/2023 within 7-day protocol window to allow adequate time for results prior to treatment. Results were reviewed by Dr. Bertis Ruddy and found to be within parameters for continued dosing at Maintenance phase Dose Level -3, Revlimid 5 mg every other day for 21 days, every 28 days. Review of myeloma panel shows evidence of continued Complete Response.   Per Dr. Bertis Ruddy, bone marrow biopsy and aspirate would not be considered standard of care at this time. Dr. Bertis Ruddy also states that 24 hr urine is not required to assess disease response.   Maintenance therapy with Revlimid:  Patient returned completed cycle 105 Medication Calendar, confirming dosing at dose level -3, Revlimid 5mg  every other day, Days 1-21. Patient notes that she missed one dose of Revlimid this cycle because she was busy and forgot.    History and physical exam: See MD note dated today.      Adverse Events:  Patient reported the following since last visit; Ongoing fatigue (grade 2) not limiting self-care. 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 Insomnia (grade 2) at least once where she took trazodone. Patient denies any nausea, vomiting, diarrhea, shortness of breath, swelling, fever, irritability, motor peripheral neuropathy, or rash in the past month.  Other reported AEs include ongoing daily moderate back ache (grade 2) which she takes tramadol and methocarbamol prn. Left arm pain (grade 2) x 1 which was relieved with rest and an aspirin.   Patient continues to exercise swimming laps at the pool average of 3 days per week. See AE table below.  Maintenance Cycle 103-105 01/13/23-04/07/23 (end of Cycle 105 = 04/06/2023) Solicited &/or Reportable Events Grade Attribution to lenalidomide Cycle #  Comments  Anemia Grade 1 Definite  103,104, 105    Hyperglycemia Grade 0 -      Lymphocyte count decreased Grade 0 -      Neutrophil count decreased Grade 2 Definite 103,104, 105   Platelet count decreased Grade 0 -      Diarrhea Grade 0 -      Dyspnea Grade 0 -      Edema: limbs Grade 0 -      Fatigue  Grade 2  Probable 103,104, 105 Moderate, occasionally limiting ADLs (unchanged from previous)  Fever Grade 0 -      Insomnia  Grade 2 Unlikely 103    Irritability Grade 0 -      Muscle weakness trunk Grade 0 -      Nausea Grade 0 -       Peripheral motor neuropathy Grade 0 -      Peripheral sensory neuropathy  Grade 2 Unlikely  103,104, 105 Moderate symptoms, unchanged from previous  Rash acneiform Grade 0  -      Vomiting Grade 0 -        Non-reportable AEs (unsolicited, < Grade 3): Moderate back pain (grade 2) Decreased WBC (grade 1)  Migraines (grade 2) Left Arm pain (grade 2)  Left hand pain (grade 2)  Plan: Patient understands to start next cycle of Revlimid today.  Next lab appointment will be scheduled for June 4th to assess patient AEs and CBC prior to next cycle of Revlimid.  Reminded patient to call our office if any new questions/concerns prior to next visit. She verbalized understanding.  Domenica Reamer, BSN, RN, [employer] Research Nurse II [phone] 04/07/2023 9:07 AM

## 2023-04-07 NOTE — Progress Notes (Signed)
[facility] OFFICE PROGRESS NOTE  Patient Care Team: Georgann Housekeeper, MD as PCP - General (Internal Medicine) Artis Delay, MD as Consulting Physician (Hematology and Oncology)  ASSESSMENT & PLAN:  Multiple myeloma in remission [facility]) Her recent myeloma panel is showed remission Her slightly elevated free light chains and IgG are unrelated The patient is comfortable to remain on Revlimid indefinitely. 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   All questions were answered. The patient knows to call the clinic with any problems, questions or concerns. The total time spent in the appointment was 20 minutes encounter with patients including review of chart and various tests results, discussions about plan of care and coordination of care plan   Artis Delay, MD 04/07/2023 2:11 PM  INTERVAL HISTORY: Please see below for problem oriented charting. she returns for treatment follow-up on maintenance Revlimid She complained of mild intermittent discomfort in the lumbosacral region that comes and goes No recent infection Overall, she tolerated treatment well She continues to exercise as tolerated  REVIEW OF SYSTEMS:   Constitutional: Denies fevers, chills or abnormal weight loss Eyes: Denies blurriness of vision Ears, nose, mouth, throat, and face: Denies mucositis or sore throat Respiratory: Denies cough, dyspnea or wheezes Cardiovascular: Denies palpitation, chest discomfort or lower extremity swelling Gastrointestinal:  Denies nausea, heartburn or change in bowel habits Skin: Denies abnormal skin rashes Lymphatics: Denies new lymphadenopathy or easy bruising Neurological:Denies  numbness, tingling or new weaknesses Behavioral/Psych: Mood is stable, no new changes  All other systems were reviewed with the patient and are negative.  I have reviewed the past medical history, past surgical history, social history and family history with the patient and they are unchanged from previous note.  ALLERGIES:  is allergic to metformin.  MEDICATIONS:  Current Outpatient Medications  Medication Sig Dispense Refill   aspirin 325 MG tablet Take 325 mg by mouth daily.      Cholecalciferol (VITAMIN D3) 2000 UNITS TABS Take 2,000 Units by mouth daily.     dorzolamide-timolol (COSOPT) 22.3-6.8 MG/ML ophthalmic solution Place 1 drop into both eyes 2 (two) times daily.     Galcanezumab-gnlm (EMGALITY) 120 MG/ML SOAJ INJECT 120MG  (1 PEN) UNDER THE SKIN EVERY 4 WEEKS. 3 mL 3   hydrochlorothiazide (HYDRODIURIL) 25 MG tablet Take 25 mg by mouth every evening.      lenalidomide (REVLIMID) 5 MG capsule TAKE 1 CAPSULE BY MOUTH EVERY  OTHER DAY FOR 21 DAYS ON THEN 7  DAYS OFF 11 capsule 0   levothyroxine (SYNTHROID, LEVOTHROID) 75 MCG tablet Take 75 mcg by mouth daily before breakfast.     methocarbamol (ROBAXIN) 500 MG tablet Take 1 tablet (500 mg total) by mouth every 8 (eight) hours as needed for muscle spasms. 90 tablet 1   Multiple Vitamins-Minerals (CENTRUM SILVER PO) Take 1 tablet by mouth daily.      polyethylene glycol (MIRALAX / GLYCOLAX) packet Take 17 g by mouth daily as needed for mild constipation.      RESTASIS 0.05 % ophthalmic emulsion PLACE 1 DROP IN BOTH EYES IN THE EVENING  4   rosuvastatin (CRESTOR) 5 MG tablet Take 5 mg by mouth daily at 2 PM.  sitaGLIPtin (JANUVIA) 50 MG tablet Take 50 mg by mouth daily.     traMADol (ULTRAM) 50 MG tablet Take by mouth every 6 (six) hours as needed.     traZODone (DESYREL) 50 MG tablet TAKE 1 TABLET (50 MG TOTAL) BY MOUTH AT BEDTIME. 90 tablet 3   venlafaxine XR (EFFEXOR-XR) 150 MG 24 hr capsule Take 1 capsule (150 mg total) by mouth  daily with breakfast. 90 capsule 3   verapamil (VERELAN PM) 120 MG 24 hr capsule Take 1 capsule (120 mg total) by mouth daily. 90 capsule 3   No current facility-administered medications for this visit.   Facility-Administered Medications Ordered in Other Visits  Medication Dose Route Frequency Provider Last Rate Last Admin   gadopentetate dimeglumine (MAGNEVIST) injection 15 mL  15 mL Intravenous Once PRN Anson Fret, MD        SUMMARY OF ONCOLOGIC HISTORY: Oncology History Overview Note  ISS stage 1 IgG lambda subtype (serum albumin 3.6, Beta2 microglobulin 2.32) Durie Salmon Stage 1   Multiple myeloma in remission (HCC)  10/10/2013 Imaging   Skeletal survery was negative   11/09/2013 Bone Marrow Biopsy   BM biopsy confirmed myeloma, 76% involved, IgG lambda subtype   12/06/2013 - 08/29/2014 Chemotherapy   Sh received chemo with revlimid, Velcade, Dexamethasone and Zometa. Patient particpated in clinical research [facility] [phone]   02/23/2014 Bone Marrow Biopsy   Repeat bone marrow biopsy showed 5% involvement   03/31/2014 Adverse Reaction   Zometa was discontinued due to osteonecrosis of the jaw.   05/05/2014 Imaging   Imaging study of the neck showed no explanation that could cause right neck pain. She is noted to have incidental left upper lung nodule. Plan to repeat imaging study in 3 months.   09/06/2014 Imaging   Bone survey showed no evidence of fracture   09/14/2014 Bone Marrow Biopsy   Bone marrow biopsy showed 8% residual plasma cells by manual count but none on the biopsy specimen   09/26/2014 -  Chemotherapy   She is started on cycle 1 of maintenance Revlimid   01/31/2015 Imaging    chest x-ray showed pneumonia. Treatment was placed on hold.   05/03/2015 Bone Marrow Biopsy   Accession: [id number] repeat bone marrow aspirate and biopsy show 5% residual plasma cells   10/14/2016 Bone Marrow Biopsy   Bone marrow biopsy showed the plasma cells represent 4% of all cells  with lack of large aggregates or sheets. To assess the plasma cell clonality, immunohistochemical stains is performed and it lack clonality. Normal cytogenetics and FISH   01/09/2017 Imaging   CT chest showed ground-glass 1.5 cm apical left upper lobe pulmonary nodule. Initial follow-up with CT at 6-12 months is recommended to confirm persistence. If persistent, repeat CT is recommended every 2 years until 5 years of stability has been established. This recommendation follows the consensus statement: Guidelines for Management of Incidental Pulmonary Nodules Detected on CT Images: From the Fleischner Society 2017; Radiology 2017; 284:228-243. 2. Mild patchy ground-glass opacities in the right upper lobe, probably inflammatory, which can also be reassessed on follow-up chest CT performed for the above dominant ground-glass nodule. 3. Solitary 3 mm right lower lobe solid pulmonary nodule, which can also be reassessed on follow-up chest CT performed for the above dominant ground-glass nodule. 4. No thoracic adenopathy. 5. Aortic atherosclerosis.  Two-vessel coronary atherosclerosis.   06/30/2017 Imaging   1. No interval change in the 11 x 13 mm ground-glass nodule left upper lobe. Given the nearly  6 months of imaging stability, repeat CT is recommended every 2 years until 5 years of stability has been established. This recommendation follows the consensus statement: Guidelines for Management of Incidental Pulmonary Nodules Detected on CT Images: From the Fleischner Society 2017; Radiology 2017; 284:228-243. 2. Interval resolution of the tiny patchy ground-glass nodules right upper lobe, likely infectious/inflammatory etiology. 3. Stable 3 mm right lower lobe pulmonary nodule. Attention on follow-up recommended. 4.  Aortic Atherosclerois (ICD10-170.0)   09/29/2017 Imaging   Skeletal survey 1. Questionable new tiny lucency noted the posterior portion of C3 vertebral body. This may represent a small lytic  lesion.  2. No definite thoracic lesion noted on today's exam. Stable lucencies in the left ilium and acetabulum.  3. No other focal abnormalities identified. The left hip is unremarkable .   04/16/2021 - 08/05/2022 Chemotherapy   Patient is on Treatment Plan : MYELOMA Research [facility] [id number] Arm D Maintenance Lenalidomide q28d     06/17/2022 Imaging   There are no focal lytic lesions. Other findings as described in the body of the report. Overall, no significant interval changes are noted since 07/31/2020.       PHYSICAL EXAMINATION: ECOG PERFORMANCE STATUS: 0 - Asymptomatic  Vitals:   04/07/23 0803  BP: (!) 144/72  Pulse: (!) 56  Resp: 18  Temp: 98.3 F (36.8 C)  SpO2: 100%   Filed Weights   04/07/23 0803  Weight: 164 lb 3.2 oz (74.5 kg)    GENERAL:alert, no distress and comfortable SKIN: skin color, texture, turgor are normal, no rashes or significant lesions EYES: normal, Conjunctiva are pink and non-injected, sclera clear OROPHARYNX:no exudate, no erythema and lips, buccal mucosa, and tongue normal  NECK: supple, thyroid normal size, non-tender, without nodularity LYMPH:  no palpable lymphadenopathy in the cervical, axillary or inguinal LUNGS: clear to auscultation and percussion with normal breathing effort HEART: regular rate & rhythm and no murmurs and no lower extremity edema ABDOMEN:abdomen soft, non-tender and normal bowel sounds Musculoskeletal:no cyanosis of digits and no clubbing.  Examination at the area of concern show mild discomfort NEURO: alert & oriented x 3 with fluent speech, no focal motor/sensory deficits  LABORATORY DATA:  I have reviewed the data as listed    Component Value Date/Time   NA 139 03/31/2023 0756   NA 141 09/29/2017 0830   K 3.8 03/31/2023 0756   K 3.6 09/29/2017 0830   CL 106 03/31/2023 0756   CO2 29 03/31/2023 0756   CO2 29 09/29/2017 0830   GLUCOSE 86 03/31/2023 0756   GLUCOSE 87 09/29/2017 0830   BUN 10 03/31/2023 0756    BUN 14.0 09/29/2017 0830   CREATININE 0.88 03/31/2023 0756   CREATININE 0.9 09/29/2017 0830   CALCIUM 9.3 03/31/2023 0756   CALCIUM 9.6 09/29/2017 0830   PROT 7.2 03/31/2023 0756   PROT 7.3 09/29/2017 0832   PROT 8.0 09/29/2017 0830   ALBUMIN 3.9 03/31/2023 0756   ALBUMIN 3.8 09/29/2017 0830   AST 20 03/31/2023 0756   AST 21 09/29/2017 0830   ALT 21 03/31/2023 0756   ALT 69 (H) 09/29/2017 0830   ALKPHOS 49 03/31/2023 0756   ALKPHOS 60 09/29/2017 0830   BILITOT 0.8 03/31/2023 0756   BILITOT 0.46 09/29/2017 0830   GFRNONAA >60 03/31/2023 0756   GFRAA >60 07/31/2020 0737    No results found for: "SPEP", "UPEP"  Lab Results  Component Value Date   WBC 3.5 (L) 03/31/2023   NEUTROABS 1.1 (L) 03/31/2023   HGB  11.2 (L) 03/31/2023   HCT 36.0 03/31/2023   MCV 72.6 (L) 03/31/2023   PLT 202 03/31/2023      Chemistry      Component Value Date/Time   NA 139 03/31/2023 0756   NA 141 09/29/2017 0830   K 3.8 03/31/2023 0756   K 3.6 09/29/2017 0830   CL 106 03/31/2023 0756   CO2 29 03/31/2023 0756   CO2 29 09/29/2017 0830   BUN 10 03/31/2023 0756   BUN 14.0 09/29/2017 0830   CREATININE 0.88 03/31/2023 0756   CREATININE 0.9 09/29/2017 0830      Component Value Date/Time   CALCIUM 9.3 03/31/2023 0756   CALCIUM 9.6 09/29/2017 0830   ALKPHOS 49 03/31/2023 0756   ALKPHOS 60 09/29/2017 0830   AST 20 03/31/2023 0756   AST 21 09/29/2017 0830   ALT 21 03/31/2023 0756   ALT 69 (H) 09/29/2017 0830   BILITOT 0.8 03/31/2023 0756   BILITOT 0.46 09/29/2017 0830       RADIOGRAPHIC STUDIES: I reviewed prior imaging study back in 2017 which show evidence of lumbar stenosis

## 2023-04-07 NOTE — Assessment & Plan Note (Signed)
She has intermittent muscle spasm at her back which is unrelated

## 2023-04-07 NOTE — Assessment & Plan Note (Signed)
Her recent myeloma panel is showed remission Her slightly elevated free light chains and IgG are unrelated The patient is comfortable to remain on Revlimid indefinitely. 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

## 2023-04-16 ENCOUNTER — Other Ambulatory Visit: Payer: Self-pay | Admitting: *Deleted

## 2023-04-16 ENCOUNTER — Encounter: Payer: Self-pay | Admitting: Family Medicine

## 2023-04-16 DIAGNOSIS — G43709 Chronic migraine without aura, not intractable, without status migrainosus: Secondary | ICD-10-CM

## 2023-04-16 MED ORDER — EMGALITY 120 MG/ML ~~LOC~~ SOAJ
SUBCUTANEOUS | 2 refills | Status: DC
Start: 2023-04-16 — End: 2023-12-16

## 2023-04-26 ENCOUNTER — Other Ambulatory Visit: Payer: Self-pay | Admitting: Hematology and Oncology

## 2023-04-26 DIAGNOSIS — C9001 Multiple myeloma in remission: Secondary | ICD-10-CM

## 2023-04-28 NOTE — Telephone Encounter (Signed)
Pls refill electronically °

## 2023-04-30 DIAGNOSIS — R5383 Other fatigue: Secondary | ICD-10-CM | POA: Diagnosis not present

## 2023-04-30 DIAGNOSIS — I700 Atherosclerosis of aorta: Secondary | ICD-10-CM | POA: Diagnosis not present

## 2023-04-30 DIAGNOSIS — D61811 Other drug-induced pancytopenia: Secondary | ICD-10-CM | POA: Diagnosis not present

## 2023-04-30 DIAGNOSIS — E119 Type 2 diabetes mellitus without complications: Secondary | ICD-10-CM | POA: Diagnosis not present

## 2023-04-30 DIAGNOSIS — E039 Hypothyroidism, unspecified: Secondary | ICD-10-CM | POA: Diagnosis not present

## 2023-04-30 DIAGNOSIS — G43909 Migraine, unspecified, not intractable, without status migrainosus: Secondary | ICD-10-CM | POA: Diagnosis not present

## 2023-04-30 DIAGNOSIS — E785 Hyperlipidemia, unspecified: Secondary | ICD-10-CM | POA: Diagnosis not present

## 2023-04-30 DIAGNOSIS — E1139 Type 2 diabetes mellitus with other diabetic ophthalmic complication: Secondary | ICD-10-CM | POA: Diagnosis not present

## 2023-04-30 DIAGNOSIS — C9001 Multiple myeloma in remission: Secondary | ICD-10-CM | POA: Diagnosis not present

## 2023-05-05 ENCOUNTER — Encounter: Payer: Self-pay | Admitting: *Deleted

## 2023-05-05 ENCOUNTER — Inpatient Hospital Stay: Payer: Medicare Other | Attending: Hematology and Oncology

## 2023-05-05 ENCOUNTER — Ambulatory Visit
Admission: RE | Admit: 2023-05-05 | Discharge: 2023-05-05 | Disposition: A | Discharge status: Home / Self Care | Payer: Medicare Other | Source: Ambulatory Visit | Attending: Internal Medicine | Admitting: Internal Medicine

## 2023-05-05 DIAGNOSIS — Z1231 Encounter for screening mammogram for malignant neoplasm of breast: Secondary | ICD-10-CM | POA: Diagnosis not present

## 2023-05-05 DIAGNOSIS — C9001 Multiple myeloma in remission: Secondary | ICD-10-CM

## 2023-05-05 DIAGNOSIS — Z006 Encounter for examination for normal comparison and control in clinical research program: Secondary | ICD-10-CM | POA: Insufficient documentation

## 2023-05-05 LAB — CBC WITH DIFFERENTIAL (CANCER CENTER ONLY)
Abs Immature Granulocytes: 0.01 10*3/uL (ref 0.00–0.07)
Basophils Absolute: 0 10*3/uL (ref 0.0–0.1)
Basophils Relative: 1 %
Eosinophils Absolute: 0.1 10*3/uL (ref 0.0–0.5)
Eosinophils Relative: 2 %
HCT: 36.9 % (ref 36.0–46.0)
Hemoglobin: 11.5 g/dL — ABNORMAL LOW (ref 12.0–15.0)
Immature Granulocytes: 0 %
Lymphocytes Relative: 53 %
Lymphs Abs: 1.8 10*3/uL (ref 0.7–4.0)
MCH: 22.8 pg — ABNORMAL LOW (ref 26.0–34.0)
MCHC: 31.2 g/dL (ref 30.0–36.0)
MCV: 73.1 fL — ABNORMAL LOW (ref 80.0–100.0)
Monocytes Absolute: 0.4 10*3/uL (ref 0.1–1.0)
Monocytes Relative: 13 %
Neutro Abs: 1 10*3/uL — ABNORMAL LOW (ref 1.7–7.7)
Neutrophils Relative %: 31 %
Platelet Count: 185 10*3/uL (ref 150–400)
RBC: 5.05 MIL/uL (ref 3.87–5.11)
RDW: 15.2 % (ref 11.5–15.5)
WBC Count: 3.4 10*3/uL — ABNORMAL LOW (ref 4.0–10.5)
nRBC: 0 % (ref 0.0–0.2)

## 2023-05-05 NOTE — Research (Signed)
Research - [facility] ECOG-ACRIN [id number] Maintenance Cycle 107, Day 1   Patient into clinic unaccompanied today for CBC and toxicity assessment prior to beginning treatment Cycle 107 of revlimid.    Lab: CBC drawn per protocol.   Adverse Events:  Patient reported the following since last visit; Ongoing fatigue (grade 2) not limiting self-care. 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 Patient denies any nausea, vomiting, diarrhea, shortness of breath, swelling, fever, irritability, motor peripheral neuropathy,insomnia or rash in the past month.  Other reported AEs include ongoing daily moderate back ache (grade 2) which she takes tramadol and methocarbamol prn. Migraines (grade 2) x 1 for which she is taking tramadol prn and emgality monthly. Patient also reported one episode of moderate left arm and chest pain (grade 2) for approximately one hour which resolved with rest and a baby aspirin. Instructed patient to call 911 for any chest pain that does not resolve after a few minutes. Patient stated she didn't think it was her "heart" and went away so she did not feel she needed to go to the ED.  Patient continues to exercise swimming laps at the pool average of 3 days per week. See AE table below.  Maintenance Cycle 106 04/07/23-05/05/23 (end of Cycle 106 = 05/04/2023) Solicited &/or Reportable Events Grade Attribution to lenalidomide Cycle # Comments  Anemia Grade 1 Definite  106    Hyperglycemia Grade 0 -      Lymphocyte count decreased Grade 0 -      Neutrophil count decreased Grade 2 Definite 106    Platelet count decreased Grade 0 -      Diarrhea Grade 0 -      Dyspnea Grade 0 -      Edema: limbs Grade 0 -      Fatigue  Grade 2  Probable 106 Moderate, occasionally limiting ADLs (unchanged from previous)  Fever Grade 0 -      Insomnia  Grade 0 -     Irritability Grade 0 -      Muscle weakness trunk Grade 0 -      Nausea Grade 0 -       Peripheral motor neuropathy  Grade 0 -      Peripheral sensory neuropathy  Grade 2 Unlikely  106 Moderate symptoms, unchanged from previous  Rash acneiform Grade 0  -      Vomiting Grade 0 -        Non-reportable AEs (unsolicited, < Grade 3): Moderate back pain (grade 2) Decreased WBC (grade 1)  Migraine (grade 2) Left Arm pain (grade 2)  Chest pain (grade 2)  Maintenance therapy with Revlimid:  Patient returned completed cycle 106 Medication Calendar, confirming dosing at dose level -3, Revlimid 5mg  every other day, Days 1-21. Patient notes no missed doses of Revlimid this cycle.  Patient given printed appointment calendar with Cycle 107 treatment days marked, to document doses for the next treatment cycle, scheduled to begin today pending lab results.    Plan:  Research nurse will call patient after review of today's lab results to notify if okay to start cycle 107 of Revlimid today.   Phone Call: Notified patient ANC result is okay to start cycle of revlimid today. We will request lab appointment to return in 4 weeks on 06/02/23 for CBC and assessment prior to starting next cycle of Revlimid.  Reminded patient to call if any questions or concerns prior to next visit and she verbalized understanding.  Domenica Reamer, BSN, RN, [employer] Research Nurse II [phone] 05/05/2023 9:40 AM

## 2023-05-19 DIAGNOSIS — E785 Hyperlipidemia, unspecified: Secondary | ICD-10-CM | POA: Diagnosis not present

## 2023-05-19 DIAGNOSIS — I10 Essential (primary) hypertension: Secondary | ICD-10-CM | POA: Diagnosis not present

## 2023-05-19 DIAGNOSIS — E1139 Type 2 diabetes mellitus with other diabetic ophthalmic complication: Secondary | ICD-10-CM | POA: Diagnosis not present

## 2023-05-19 DIAGNOSIS — E039 Hypothyroidism, unspecified: Secondary | ICD-10-CM | POA: Diagnosis not present

## 2023-05-19 DIAGNOSIS — H401111 Primary open-angle glaucoma, right eye, mild stage: Secondary | ICD-10-CM | POA: Diagnosis not present

## 2023-05-19 DIAGNOSIS — G43909 Migraine, unspecified, not intractable, without status migrainosus: Secondary | ICD-10-CM | POA: Diagnosis not present

## 2023-05-19 DIAGNOSIS — D508 Other iron deficiency anemias: Secondary | ICD-10-CM | POA: Diagnosis not present

## 2023-05-24 ENCOUNTER — Other Ambulatory Visit: Payer: Self-pay | Admitting: Hematology and Oncology

## 2023-05-24 DIAGNOSIS — C9001 Multiple myeloma in remission: Secondary | ICD-10-CM

## 2023-05-24 NOTE — Telephone Encounter (Signed)
Pls refill electronically °

## 2023-06-02 ENCOUNTER — Other Ambulatory Visit: Payer: Self-pay

## 2023-06-02 ENCOUNTER — Encounter: Payer: Self-pay | Admitting: *Deleted

## 2023-06-02 ENCOUNTER — Inpatient Hospital Stay: Payer: Medicare Other | Attending: Hematology and Oncology

## 2023-06-02 DIAGNOSIS — Z006 Encounter for examination for normal comparison and control in clinical research program: Secondary | ICD-10-CM | POA: Insufficient documentation

## 2023-06-02 DIAGNOSIS — C9001 Multiple myeloma in remission: Secondary | ICD-10-CM

## 2023-06-02 DIAGNOSIS — Z79899 Other long term (current) drug therapy: Secondary | ICD-10-CM | POA: Insufficient documentation

## 2023-06-02 DIAGNOSIS — M62838 Other muscle spasm: Secondary | ICD-10-CM | POA: Insufficient documentation

## 2023-06-02 LAB — CBC WITH DIFFERENTIAL (CANCER CENTER ONLY)
Abs Immature Granulocytes: 0.01 10*3/uL (ref 0.00–0.07)
Basophils Absolute: 0 10*3/uL (ref 0.0–0.1)
Basophils Relative: 1 %
Eosinophils Absolute: 0.1 10*3/uL (ref 0.0–0.5)
Eosinophils Relative: 1 %
HCT: 36.3 % (ref 36.0–46.0)
Hemoglobin: 11.3 g/dL — ABNORMAL LOW (ref 12.0–15.0)
Immature Granulocytes: 0 %
Lymphocytes Relative: 59 %
Lymphs Abs: 2.1 10*3/uL (ref 0.7–4.0)
MCH: 22.9 pg — ABNORMAL LOW (ref 26.0–34.0)
MCHC: 31.1 g/dL (ref 30.0–36.0)
MCV: 73.5 fL — ABNORMAL LOW (ref 80.0–100.0)
Monocytes Absolute: 0.4 10*3/uL (ref 0.1–1.0)
Monocytes Relative: 11 %
Neutro Abs: 1 10*3/uL — ABNORMAL LOW (ref 1.7–7.7)
Neutrophils Relative %: 28 %
Platelet Count: 203 10*3/uL (ref 150–400)
RBC: 4.94 MIL/uL (ref 3.87–5.11)
RDW: 15.4 % (ref 11.5–15.5)
WBC Count: 3.6 10*3/uL — ABNORMAL LOW (ref 4.0–10.5)
nRBC: 0 % (ref 0.0–0.2)

## 2023-06-02 NOTE — Research (Signed)
Research - [facility] ECOG-ACRIN [id number] Maintenance Cycle 108, Day 1   Patient into clinic unaccompanied today for CBC and toxicity assessment prior to beginning treatment Cycle 108 of revlimid.    Lab: CBC drawn per protocol.   Adverse Events:  Patient reported the following since last visit; Ongoing fatigue (grade 2) not limiting self-care. 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 Patient denies any nausea, vomiting, diarrhea, shortness of breath, swelling, fever, irritability, motor peripheral neuropathy, insomnia or rash in the past month.  Other reported AEs include ongoing daily moderate back ache (grade 2) which she takes tramadol and methocarbamol prn. Patient also reported hurting her right arm and shoulder when lifting a big case of bottled water one week ago. She has taken methocarbamol prn for the right arm/shoulder pain (grade 2), which is ongoing.  Patient continues to exercise swimming laps at the pool average of 3 days per week. See AE table below.  Maintenance Cycle 107 05/05/23-06/02/23 (end of Cycle 107 = 06/01/2023) Solicited &/or Reportable Events Grade Attribution to lenalidomide Cycle # Comments  Anemia Grade 1 Definite  107    Hyperglycemia Grade 0 -      Lymphocyte count decreased Grade 0 -      Neutrophil count decreased Grade 2 Definite 107    Platelet count decreased Grade 0 -      Diarrhea Grade 0 -      Dyspnea Grade 0 -      Edema: limbs Grade 0 -      Fatigue  Grade 2  Probable 107 Moderate, occasionally limiting ADLs (unchanged from previous)  Fever Grade 0 -      Insomnia  Grade 0 -     Irritability Grade 0 -      Muscle weakness trunk Grade 0 -      Nausea Grade 0 -       Peripheral motor neuropathy Grade 0 -      Peripheral sensory neuropathy  Grade 2 Unlikely  107 Moderate symptoms, unchanged from previous  Rash acneiform Grade 0  -      Vomiting Grade 0 -        Non-reportable AEs (unsolicited, < Grade 3): Moderate back pain  (grade 2) Decreased WBC (grade 1)  Right Arm and shoulder pain (grade 2)   Maintenance therapy with Revlimid:  Patient returned completed cycle 107 Medication Calendar, confirming dosing at dose level -3, Revlimid 5mg  every other day, Days 1-21. Patient notes no missed doses of Revlimid this cycle.  Patient given printed appointment calendar with Cycle 108 treatment days marked, to document doses for the next treatment cycle, scheduled to begin today pending lab results.    Plan:  Research nurse will call patient after review of today's lab results to notify if okay to start cycle 108 of Revlimid today. If cycle starts today then next lab will be scheduled in 3 weeks, 1 week prior to MD visit, to allow time for myeloma panel to result. Annual Bone Survey is also due and will be scheduled same day as lab appointment in 3 weeks.   Phone Call: Notified patient ANC result is okay to start cycle 108 of revlimid today.  Lab and bone survey will be scheduled for 06/23/23 and Dr. Bertis Ruddy is scheduled the following week on 06/30/23 prior to starting the next cycle of Revlimid.  Reminded patient to call if any questions or concerns prior to next visit and she verbalized understanding.  Domenica Reamer, BSN, RN, [employer] Research Nurse II [phone] 06/02/2023 8:28 AM

## 2023-06-05 ENCOUNTER — Telehealth: Payer: Self-pay | Admitting: *Deleted

## 2023-06-05 NOTE — Telephone Encounter (Signed)
[id number] Study;  Informed patient of lab and bone survey scheduled for 7/23 with lab at 8:15 am and bone survey at 9:00.  Instructed to arrive at 8:45 am at [facility] for the bone survey after lab appointment.  Patient verbalized understanding.

## 2023-06-20 ENCOUNTER — Other Ambulatory Visit: Payer: Self-pay | Admitting: Hematology and Oncology

## 2023-06-20 DIAGNOSIS — C9001 Multiple myeloma in remission: Secondary | ICD-10-CM

## 2023-06-22 NOTE — Telephone Encounter (Signed)
Pls refill electronically °

## 2023-06-23 ENCOUNTER — Ambulatory Visit (HOSPITAL_COMMUNITY)
Admission: RE | Admit: 2023-06-23 | Discharge: 2023-06-23 | Disposition: A | Discharge status: Home / Self Care | Payer: Medicare Other | Source: Ambulatory Visit | Attending: Hematology and Oncology | Admitting: Hematology and Oncology

## 2023-06-23 ENCOUNTER — Inpatient Hospital Stay: Payer: Medicare Other

## 2023-06-23 ENCOUNTER — Other Ambulatory Visit: Payer: Self-pay

## 2023-06-23 DIAGNOSIS — C9001 Multiple myeloma in remission: Secondary | ICD-10-CM

## 2023-06-23 DIAGNOSIS — M47816 Spondylosis without myelopathy or radiculopathy, lumbar region: Secondary | ICD-10-CM | POA: Diagnosis not present

## 2023-06-23 DIAGNOSIS — Z006 Encounter for examination for normal comparison and control in clinical research program: Secondary | ICD-10-CM | POA: Diagnosis not present

## 2023-06-23 DIAGNOSIS — M47812 Spondylosis without myelopathy or radiculopathy, cervical region: Secondary | ICD-10-CM | POA: Diagnosis not present

## 2023-06-23 DIAGNOSIS — Z8579 Personal history of other malignant neoplasms of lymphoid, hematopoietic and related tissues: Secondary | ICD-10-CM | POA: Diagnosis not present

## 2023-06-23 LAB — CBC WITH DIFFERENTIAL (CANCER CENTER ONLY)
Abs Immature Granulocytes: 0.01 10*3/uL (ref 0.00–0.07)
Basophils Absolute: 0 10*3/uL (ref 0.0–0.1)
Basophils Relative: 1 %
Eosinophils Absolute: 0.1 10*3/uL (ref 0.0–0.5)
Eosinophils Relative: 2 %
HCT: 36.9 % (ref 36.0–46.0)
Hemoglobin: 11.7 g/dL — ABNORMAL LOW (ref 12.0–15.0)
Immature Granulocytes: 0 %
Lymphocytes Relative: 61 %
Lymphs Abs: 1.9 10*3/uL (ref 0.7–4.0)
MCH: 22.9 pg — ABNORMAL LOW (ref 26.0–34.0)
MCHC: 31.7 g/dL (ref 30.0–36.0)
MCV: 72.2 fL — ABNORMAL LOW (ref 80.0–100.0)
Monocytes Absolute: 0.4 10*3/uL (ref 0.1–1.0)
Monocytes Relative: 12 %
Neutro Abs: 0.7 10*3/uL — ABNORMAL LOW (ref 1.7–7.7)
Neutrophils Relative %: 24 %
Platelet Count: 208 10*3/uL (ref 150–400)
RBC: 5.11 MIL/uL (ref 3.87–5.11)
RDW: 15.4 % (ref 11.5–15.5)
WBC Count: 3.1 10*3/uL — ABNORMAL LOW (ref 4.0–10.5)
nRBC: 0 % (ref 0.0–0.2)

## 2023-06-23 LAB — CMP (CANCER CENTER ONLY)
ALT: 25 U/L (ref 0–44)
AST: 22 U/L (ref 15–41)
Albumin: 3.9 g/dL (ref 3.5–5.0)
Alkaline Phosphatase: 46 U/L (ref 38–126)
Anion gap: 6 (ref 5–15)
BUN: 11 mg/dL (ref 8–23)
CO2: 27 mmol/L (ref 22–32)
Calcium: 9.3 mg/dL (ref 8.9–10.3)
Chloride: 107 mmol/L (ref 98–111)
Creatinine: 0.88 mg/dL (ref 0.44–1.00)
GFR, Estimated: >60 mL/min (ref >60)
Glucose, Bld: 87 mg/dL (ref 70–99)
Potassium: 3.8 mmol/L (ref 3.5–5.1)
Sodium: 140 mmol/L (ref 135–145)
Total Bilirubin: 0.7 mg/dL (ref 0.3–1.2)
Total Protein: 7 g/dL (ref 6.5–8.1)

## 2023-06-23 LAB — LACTATE DEHYDROGENASE: LDH: 153 U/L (ref 98–192)

## 2023-06-23 LAB — TSH: TSH: 1.433 u[IU]/mL (ref 0.350–4.500)

## 2023-06-24 ENCOUNTER — Telehealth: Payer: Self-pay

## 2023-06-24 ENCOUNTER — Other Ambulatory Visit: Payer: Self-pay

## 2023-06-24 DIAGNOSIS — C9001 Multiple myeloma in remission: Secondary | ICD-10-CM

## 2023-06-24 LAB — KAPPA/LAMBDA LIGHT CHAINS
Kappa free light chain: 33.4 mg/L — ABNORMAL HIGH (ref 3.3–19.4)
Kappa, lambda light chain ratio: 1.54 (ref 0.26–1.65)
Lambda free light chains: 21.7 mg/L (ref 5.7–26.3)

## 2023-06-24 NOTE — Telephone Encounter (Signed)
[id number] Study: Patient had labs drawn yesterday, one week prior to MD appointment next week, in order to have results available for that visit. CBC yesterday showed ANC 0.7 and it needs to be at least 1.0 for patient to start next cycle of Revlimid. Scheduled additional lab appointment for next Tuesday 06/30/23 prior to MD visit to recheck the CBC. Informed patient of need for repeat lab next week and to arrive early for lab appointment at 730a. She verbalized understanding.   Juanita Laster, RN, BSN, CPN Clinical Research Nurse I [phone]  06/24/2023 9:01 AM

## 2023-06-25 LAB — MULTIPLE MYELOMA PANEL, SERUM
Albumin SerPl Elph-Mcnc: 3.5 g/dL (ref 2.9–4.4)
Albumin/Glob SerPl: 1.1 (ref 0.7–1.7)
Alpha 1: 0.2 g/dL (ref 0.0–0.4)
Alpha2 Glob SerPl Elph-Mcnc: 0.6 g/dL (ref 0.4–1.0)
B-Globulin SerPl Elph-Mcnc: 1 g/dL (ref 0.7–1.3)
Gamma Glob SerPl Elph-Mcnc: 1.7 g/dL (ref 0.4–1.8)
Globulin, Total: 3.5 g/dL (ref 2.2–3.9)
IgG (Immunoglobin G), Serum: 1876 mg/dL — ABNORMAL HIGH (ref 586–1602)
IgM (Immunoglobulin M), Srm: 50 mg/dL (ref 26–217)
Total Protein ELP: 7 g/dL (ref 6.0–8.5)

## 2023-06-30 ENCOUNTER — Inpatient Hospital Stay: Payer: Medicare Other | Admitting: Hematology and Oncology

## 2023-06-30 ENCOUNTER — Encounter: Payer: Self-pay | Admitting: *Deleted

## 2023-06-30 ENCOUNTER — Inpatient Hospital Stay: Payer: Medicare Other

## 2023-06-30 ENCOUNTER — Other Ambulatory Visit: Payer: Self-pay

## 2023-06-30 ENCOUNTER — Encounter: Payer: Self-pay | Admitting: Hematology and Oncology

## 2023-06-30 VITALS — BP 161/90 | HR 59 | Temp 97.8°F | Resp 18 | Ht 67.0 in | Wt 165.2 lb

## 2023-06-30 DIAGNOSIS — C9001 Multiple myeloma in remission: Secondary | ICD-10-CM | POA: Diagnosis not present

## 2023-06-30 DIAGNOSIS — Z006 Encounter for examination for normal comparison and control in clinical research program: Secondary | ICD-10-CM | POA: Diagnosis not present

## 2023-06-30 DIAGNOSIS — M62838 Other muscle spasm: Secondary | ICD-10-CM | POA: Diagnosis not present

## 2023-06-30 LAB — CBC WITH DIFFERENTIAL (CANCER CENTER ONLY)
Abs Immature Granulocytes: 0 10*3/uL (ref 0.00–0.07)
Basophils Absolute: 0 10*3/uL (ref 0.0–0.1)
Basophils Relative: 1 %
Eosinophils Absolute: 0.1 10*3/uL (ref 0.0–0.5)
Eosinophils Relative: 1 %
HCT: 35.7 % — ABNORMAL LOW (ref 36.0–46.0)
Hemoglobin: 11.3 g/dL — ABNORMAL LOW (ref 12.0–15.0)
Immature Granulocytes: 0 %
Lymphocytes Relative: 53 %
Lymphs Abs: 1.9 10*3/uL (ref 0.7–4.0)
MCH: 22.8 pg — ABNORMAL LOW (ref 26.0–34.0)
MCHC: 31.7 g/dL (ref 30.0–36.0)
MCV: 72.1 fL — ABNORMAL LOW (ref 80.0–100.0)
Monocytes Absolute: 0.4 10*3/uL (ref 0.1–1.0)
Monocytes Relative: 12 %
Neutro Abs: 1.2 10*3/uL — ABNORMAL LOW (ref 1.7–7.7)
Neutrophils Relative %: 33 %
Platelet Count: 217 10*3/uL (ref 150–400)
RBC: 4.95 MIL/uL (ref 3.87–5.11)
RDW: 15.2 % (ref 11.5–15.5)
WBC Count: 3.7 10*3/uL — ABNORMAL LOW (ref 4.0–10.5)
nRBC: 0 % (ref 0.0–0.2)

## 2023-06-30 NOTE — Assessment & Plan Note (Signed)
She have history of intermittent muscle spasm and shoulder discomfort This is not related to her treatment or recurrent myeloma

## 2023-06-30 NOTE — Assessment & Plan Note (Signed)
Her recent myeloma panel is showed remission Her slightly elevated free light chains and IgG are unrelated The patient is comfortable to remain on Revlimid indefinitely. 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

## 2023-06-30 NOTE — Progress Notes (Signed)
[facility] OFFICE PROGRESS NOTE  Patient Care Team: Georgann Housekeeper, MD as PCP - General (Internal Medicine) Artis Delay, MD as Consulting Physician (Hematology and Oncology)  ASSESSMENT & PLAN:  Multiple myeloma in remission [facility]) Her recent myeloma panel is showed remission Her slightly elevated free light chains and IgG are unrelated The patient is comfortable to remain on Revlimid indefinitely. 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   All questions were answered. The patient knows to call the clinic with any problems, questions or concerns. The total time spent in the appointment was 20 minutes encounter with patients including review of chart and various tests results, discussions about plan of care and coordination of care plan   Artis Delay, MD 06/30/2023 10:15 AM  INTERVAL HISTORY: Please see below for problem oriented charting. she returns for treatment follow-up on maintenance Revlimid She is doing well Denies missing doses No recent infection She has occasional shoulder discomfort  REVIEW OF SYSTEMS:   Constitutional: Denies fevers, chills or abnormal weight loss Eyes: Denies blurriness of vision Ears, nose, mouth, throat, and face: Denies mucositis or sore throat Respiratory: Denies cough, dyspnea or wheezes Cardiovascular: Denies palpitation, chest discomfort or lower extremity swelling Gastrointestinal:  Denies nausea, heartburn or change in bowel habits Skin: Denies abnormal skin rashes Lymphatics: Denies new lymphadenopathy or easy bruising Neurological:Denies numbness, tingling or new  weaknesses Behavioral/Psych: Mood is stable, no new changes  All other systems were reviewed with the patient and are negative.  I have reviewed the past medical history, past surgical history, social history and family history with the patient and they are unchanged from previous note.  ALLERGIES:  is allergic to metformin.  MEDICATIONS:  Current Outpatient Medications  Medication Sig Dispense Refill   aspirin 325 MG tablet Take 325 mg by mouth daily.      Cholecalciferol (VITAMIN D3) 2000 UNITS TABS Take 2,000 Units by mouth daily.     dorzolamide-timolol (COSOPT) 22.3-6.8 MG/ML ophthalmic solution Place 1 drop into both eyes 2 (two) times daily.     Galcanezumab-gnlm (EMGALITY) 120 MG/ML SOAJ INJECT 120MG  (1 PEN) UNDER THE SKIN EVERY 4 WEEKS. 3 mL 2   hydrochlorothiazide (HYDRODIURIL) 25 MG tablet Take 25 mg by mouth every evening.      lenalidomide (REVLIMID) 5 MG capsule TAKE 1 CAPSULE BY MOUTH EVERY  OTHER DAY FOR 21 DAYS ON, THEN 7 DAYS OFF 11 capsule 0   levothyroxine (SYNTHROID, LEVOTHROID) 75 MCG tablet Take 75 mcg by mouth daily before breakfast.     methocarbamol (ROBAXIN) 500 MG tablet Take 1 tablet (500 mg total) by mouth every 8 (eight) hours as needed for muscle spasms. 90 tablet 1   Multiple Vitamins-Minerals (CENTRUM SILVER PO) Take 1 tablet by mouth daily.      polyethylene glycol (MIRALAX / GLYCOLAX) packet Take 17 g by mouth daily as needed for mild constipation.      RESTASIS 0.05 % ophthalmic emulsion PLACE 1 DROP IN BOTH EYES IN THE EVENING  4   rosuvastatin (CRESTOR) 5 MG tablet Take 5 mg by mouth daily at 2 PM.     sitaGLIPtin (JANUVIA)  50 MG tablet Take 50 mg by mouth daily.     traMADol (ULTRAM) 50 MG tablet Take by mouth every 6 (six) hours as needed.     traZODone (DESYREL) 50 MG tablet TAKE 1 TABLET (50 MG TOTAL) BY MOUTH AT BEDTIME. 90 tablet 3   venlafaxine XR (EFFEXOR-XR) 150 MG 24 hr capsule Take 1 capsule (150 mg total) by mouth daily with breakfast. 90  capsule 3   verapamil (VERELAN PM) 120 MG 24 hr capsule Take 1 capsule (120 mg total) by mouth daily. 90 capsule 3   No current facility-administered medications for this visit.   Facility-Administered Medications Ordered in Other Visits  Medication Dose Route Frequency Provider Last Rate Last Admin   gadopentetate dimeglumine (MAGNEVIST) injection 15 mL  15 mL Intravenous Once PRN Anson Fret, MD        SUMMARY OF ONCOLOGIC HISTORY: Oncology History Overview Note  ISS stage 1 IgG lambda subtype (serum albumin 3.6, Beta2 microglobulin 2.32) Durie Salmon Stage 1   Multiple myeloma in remission (HCC)  10/10/2013 Imaging   Skeletal survery was negative   11/09/2013 Bone Marrow Biopsy   BM biopsy confirmed myeloma, 76% involved, IgG lambda subtype   12/06/2013 - 08/29/2014 Chemotherapy   Sh received chemo with revlimid, Velcade, Dexamethasone and Zometa. Patient particpated in clinical research [facility] [phone]   02/23/2014 Bone Marrow Biopsy   Repeat bone marrow biopsy showed 5% involvement   03/31/2014 Adverse Reaction   Zometa was discontinued due to osteonecrosis of the jaw.   05/05/2014 Imaging   Imaging study of the neck showed no explanation that could cause right neck pain. She is noted to have incidental left upper lung nodule. Plan to repeat imaging study in 3 months.   09/06/2014 Imaging   Bone survey showed no evidence of fracture   09/14/2014 Bone Marrow Biopsy   Bone marrow biopsy showed 8% residual plasma cells by manual count but none on the biopsy specimen   09/26/2014 -  Chemotherapy   She is started on cycle 1 of maintenance Revlimid   01/31/2015 Imaging    chest x-ray showed pneumonia. Treatment was placed on hold.   05/03/2015 Bone Marrow Biopsy   Accession: [id number] repeat bone marrow aspirate and biopsy show 5% residual plasma cells   10/14/2016 Bone Marrow Biopsy   Bone marrow biopsy showed the plasma cells represent 4% of all cells with lack of large  aggregates or sheets. To assess the plasma cell clonality, immunohistochemical stains is performed and it lack clonality. Normal cytogenetics and FISH   01/09/2017 Imaging   CT chest showed ground-glass 1.5 cm apical left upper lobe pulmonary nodule. Initial follow-up with CT at 6-12 months is recommended to confirm persistence. If persistent, repeat CT is recommended every 2 years until 5 years of stability has been established. This recommendation follows the consensus statement: Guidelines for Management of Incidental Pulmonary Nodules Detected on CT Images: From the Fleischner Society 2017; Radiology 2017; 284:228-243. 2. Mild patchy ground-glass opacities in the right upper lobe, probably inflammatory, which can also be reassessed on follow-up chest CT performed for the above dominant ground-glass nodule. 3. Solitary 3 mm right lower lobe solid pulmonary nodule, which can also be reassessed on follow-up chest CT performed for the above dominant ground-glass nodule. 4. No thoracic adenopathy. 5. Aortic atherosclerosis.  Two-vessel coronary atherosclerosis.   06/30/2017 Imaging   1. No interval change in the 11 x 13 mm ground-glass nodule left upper lobe. Given the nearly 6 months  of imaging stability, repeat CT is recommended every 2 years until 5 years of stability has been established. This recommendation follows the consensus statement: Guidelines for Management of Incidental Pulmonary Nodules Detected on CT Images: From the Fleischner Society 2017; Radiology 2017; 284:228-243. 2. Interval resolution of the tiny patchy ground-glass nodules right upper lobe, likely infectious/inflammatory etiology. 3. Stable 3 mm right lower lobe pulmonary nodule. Attention on follow-up recommended. 4.  Aortic Atherosclerois (ICD10-170.0)   09/29/2017 Imaging   Skeletal survey 1. Questionable new tiny lucency noted the posterior portion of C3 vertebral body. This may represent a small lytic lesion.  2. No  definite thoracic lesion noted on today's exam. Stable lucencies in the left ilium and acetabulum.  3. No other focal abnormalities identified. The left hip is unremarkable .   04/16/2021 - 08/05/2022 Chemotherapy   Patient is on Treatment Plan : MYELOMA Research [facility] [id number] Arm D Maintenance Lenalidomide q28d     06/17/2022 Imaging   There are no focal lytic lesions. Other findings as described in the body of the report. Overall, no significant interval changes are noted since 07/31/2020.       PHYSICAL EXAMINATION: ECOG PERFORMANCE STATUS: 1 - Symptomatic but completely ambulatory  Vitals:   06/30/23 0818  BP: (!) 161/90  Pulse: (!) 59  Resp: 18  Temp: 97.8 F (36.6 C)  SpO2: 100%   Filed Weights   06/30/23 0818  Weight: 165 lb 3.2 oz (74.9 kg)    GENERAL:alert, no distress and comfortable SKIN: skin color, texture, turgor are normal, no rashes or significant lesions EYES: normal, Conjunctiva are pink and non-injected, sclera clear OROPHARYNX:no exudate, no erythema and lips, buccal mucosa, and tongue normal  NECK: supple, thyroid normal size, non-tender, without nodularity LYMPH:  no palpable lymphadenopathy in the cervical, axillary or inguinal LUNGS: clear to auscultation and percussion with normal breathing effort HEART: regular rate & rhythm and no murmurs and no lower extremity edema ABDOMEN:abdomen soft, non-tender and normal bowel sounds Musculoskeletal:no cyanosis of digits and no clubbing  NEURO: alert & oriented x 3 with fluent speech, no focal motor/sensory deficits  LABORATORY DATA:  I have reviewed the data as listed    Component Value Date/Time   NA 140 06/23/2023 0804   NA 141 09/29/2017 0830   K 3.8 06/23/2023 0804   K 3.6 09/29/2017 0830   CL 107 06/23/2023 0804   CO2 27 06/23/2023 0804   CO2 29 09/29/2017 0830   GLUCOSE 87 06/23/2023 0804   GLUCOSE 87 09/29/2017 0830   BUN 11 06/23/2023 0804   BUN 14.0 09/29/2017 0830   CREATININE 0.88  06/23/2023 0804   CREATININE 0.9 09/29/2017 0830   CALCIUM 9.3 06/23/2023 0804   CALCIUM 9.6 09/29/2017 0830   PROT 7.0 06/23/2023 0804   PROT 7.3 09/29/2017 0832   PROT 8.0 09/29/2017 0830   ALBUMIN 3.9 06/23/2023 0804   ALBUMIN 3.8 09/29/2017 0830   AST 22 06/23/2023 0804   AST 21 09/29/2017 0830   ALT 25 06/23/2023 0804   ALT 69 (H) 09/29/2017 0830   ALKPHOS 46 06/23/2023 0804   ALKPHOS 60 09/29/2017 0830   BILITOT 0.7 06/23/2023 0804   BILITOT 0.46 09/29/2017 0830   GFRNONAA >60 06/23/2023 0804   GFRAA >60 07/31/2020 0737    No results found for: "SPEP", "UPEP"  Lab Results  Component Value Date   WBC 3.7 (L) 06/30/2023   NEUTROABS 1.2 (L) 06/30/2023   HGB 11.3 (L) 06/30/2023   HCT 35.7 (L)  06/30/2023   MCV 72.1 (L) 06/30/2023   PLT 217 06/30/2023      Chemistry      Component Value Date/Time   NA 140 06/23/2023 0804   NA 141 09/29/2017 0830   K 3.8 06/23/2023 0804   K 3.6 09/29/2017 0830   CL 107 06/23/2023 0804   CO2 27 06/23/2023 0804   CO2 29 09/29/2017 0830   BUN 11 06/23/2023 0804   BUN 14.0 09/29/2017 0830   CREATININE 0.88 06/23/2023 0804   CREATININE 0.9 09/29/2017 0830      Component Value Date/Time   CALCIUM 9.3 06/23/2023 0804   CALCIUM 9.6 09/29/2017 0830   ALKPHOS 46 06/23/2023 0804   ALKPHOS 60 09/29/2017 0830   AST 22 06/23/2023 0804   AST 21 09/29/2017 0830   ALT 25 06/23/2023 0804   ALT 69 (H) 09/29/2017 0830   BILITOT 0.7 06/23/2023 0804   BILITOT 0.46 09/29/2017 0830       RADIOGRAPHIC STUDIES: I have personally reviewed the radiological images as listed and agreed with the findings in the report. DG Bone Survey Met  Result Date: 06/29/2023 CLINICAL DATA:  History of multiple myeloma. EXAM: METASTATIC BONE SURVEY COMPARISON:  06/17/2022. FINDINGS: No focal lytic bone lesions of multiple myeloma identified. Again seen are degenerative changes within the cervical spine. Mild degenerative changes also noted at L4-5 and L5-S1.  IMPRESSION: 1. No focal lytic bone lesions of multiple myeloma identified. 2. Degenerative changes within the cervical and lumbar spine. Electronically Signed   By: Signa Kell M.D.   On: 06/29/2023 11:44

## 2023-06-30 NOTE — Research (Signed)
Research - [facility] ECOG-ACRIN [id number] Maintenance Cycle 109, Day 1  Patient into clinic unaccompanied today for evaluation prior to beginning treatment Cycle 109 of revlimid.     Labs w/myeloma panel: Blood drawn on 06/23/2023 within 7-day protocol window to allow adequate time for results prior to treatment. CBC also repeated this morning as ANC was too low last week to resume Revlimid today. Results from last week and this morning were reviewed by Dr. Bertis Ruddy and found to be within parameters for continued dosing at Maintenance phase Dose Level -3, Revlimid 5 mg every other day for 21 days, every 28 days. Review of myeloma panel shows evidence of continued Complete Response.   Per Dr. Bertis Ruddy, bone marrow biopsy and aspirate would not be considered standard of care at this time. Dr. Bertis Ruddy also states that 24 hr urine is not required to assess disease response.   Skeletal Survey: Completed 06/23/23 shows no evidence of multiple myeloma.   Maintenance therapy with Revlimid:  Patient returned completed cycle 108 Medication Calendar, confirming dosing at dose level -3, Revlimid 5mg  every other day, Days 1-21. Patient notes no missed doses of Revlimid this cycle.    Patient given printed appointment calendar with Cycle 109 treatment days marked, to document doses for the next treatment cycle, scheduled to begin today.  History and physical exam: See MD note dated today.     Adverse Events:  Patient reported the following since last visit; Ongoing fatigue (grade 2) not limiting self-care. 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 Insomnia moderate (grade 2).  Patient denies any nausea, vomiting, diarrhea, shortness of breath, swelling, fever, irritability, motor peripheral neuropathy, or rash in the past month.  Other reported AEs include ongoing daily moderate back ache (grade 2) which she takes tramadol and methocarbamol prn. Patient also reports mild bilateral shoulder  pain with movement (grade 1) for several weeks.    Patient continues to exercise swimming laps at the pool average of 3 days per week. See AE table below.  Maintenance Cycles 106-108 04/07/23-06/30/23 (end of Cycle 108 = 06/29/2023) Solicited &/or Reportable Events Grade Attribution to lenalidomide Cycle # Comments  Anemia Grade 1 Definite 106,107, 108    Hyperglycemia Grade 0 -      Lymphocyte count decreased Grade 0 -      Neutrophil count decreased Grade 2 Definite 106,107, 108    Platelet count decreased Grade 0 -      Diarrhea Grade 0 -      Dyspnea Grade 0 -      Edema: limbs Grade 0 -      Fatigue  Grade 2  Probable 106,107, 108 Moderate, occasionally limiting ADLs (unchanged from previous)  Fever Grade 0 -      Insomnia  Grade 0 - 108    Irritability Grade 0 -      Muscle weakness trunk Grade 0 -      Nausea Grade 0 -       Peripheral motor neuropathy Grade 0 -      Peripheral sensory neuropathy  Grade 2 Unlikely 106,107, 108 Moderate symptoms, unchanged from previous  Rash acneiform Grade 0  -      Vomiting Grade 0 -        Non-reportable AEs (unsolicited, < Grade 3): Moderate back pain (grade 2) Decreased WBC (grade 1)  Migraine (grade 2) Left arm pain (grade 2) Chest pain (grade 2) Right Arm and shoulder pain (grade 2)  Bilateral shoulder pain (  grade 1)     Plan: Patient understands to start next cycle of Revlimid today.  Next lab appointment will be scheduled for 07/28/23 to assess patient AEs and CBC prior to next cycle of Revlimid.  Reminded patient to call our office if any new questions/concerns prior to next visit. She verbalized understanding.   Domenica Reamer, BSN, RN, [employer] Research Nurse II [phone] 06/30/2023 8:03 AM

## 2023-07-16 ENCOUNTER — Other Ambulatory Visit: Payer: Self-pay

## 2023-07-16 DIAGNOSIS — C9001 Multiple myeloma in remission: Secondary | ICD-10-CM

## 2023-07-16 MED ORDER — LENALIDOMIDE 5 MG PO CAPS
ORAL_CAPSULE | ORAL | 0 refills | Status: DC
Start: 2023-07-16 — End: 2023-08-17

## 2023-07-28 ENCOUNTER — Inpatient Hospital Stay: Payer: Medicare Other | Attending: Hematology and Oncology

## 2023-07-28 ENCOUNTER — Encounter: Payer: Self-pay | Admitting: *Deleted

## 2023-07-28 DIAGNOSIS — Z006 Encounter for examination for normal comparison and control in clinical research program: Secondary | ICD-10-CM | POA: Insufficient documentation

## 2023-07-28 DIAGNOSIS — C9001 Multiple myeloma in remission: Secondary | ICD-10-CM | POA: Diagnosis present

## 2023-07-28 LAB — CBC WITH DIFFERENTIAL (CANCER CENTER ONLY)
Abs Immature Granulocytes: 0.01 10*3/uL (ref 0.00–0.07)
Basophils Absolute: 0 10*3/uL (ref 0.0–0.1)
Basophils Relative: 1 %
Eosinophils Absolute: 0.1 10*3/uL (ref 0.0–0.5)
Eosinophils Relative: 2 %
HCT: 36 % (ref 36.0–46.0)
Hemoglobin: 11.2 g/dL — ABNORMAL LOW (ref 12.0–15.0)
Immature Granulocytes: 0 %
Lymphocytes Relative: 54 %
Lymphs Abs: 1.9 10*3/uL (ref 0.7–4.0)
MCH: 22.9 pg — ABNORMAL LOW (ref 26.0–34.0)
MCHC: 31.1 g/dL (ref 30.0–36.0)
MCV: 73.6 fL — ABNORMAL LOW (ref 80.0–100.0)
Monocytes Absolute: 0.5 10*3/uL (ref 0.1–1.0)
Monocytes Relative: 14 %
Neutro Abs: 1 10*3/uL — ABNORMAL LOW (ref 1.7–7.7)
Neutrophils Relative %: 29 %
Platelet Count: 197 10*3/uL (ref 150–400)
RBC: 4.89 MIL/uL (ref 3.87–5.11)
RDW: 15.5 % (ref 11.5–15.5)
WBC Count: 3.5 10*3/uL — ABNORMAL LOW (ref 4.0–10.5)
nRBC: 0 % (ref 0.0–0.2)

## 2023-07-28 NOTE — Research (Signed)
  Research - [facility] ECOG-ACRIN [id number] Maintenance Cycle 110, Day 1  Patient into clinic unaccompanied today for CBC and toxicity assessment prior to beginning treatment Cycle 110 of revlimid.     Lab: CBC drawn per protocol.    Adverse Events:  Patient reported the following since last visit; Ongoing fatigue (grade 2) not limiting self-care. 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 3 episodes of left sided stomach ache (grade 1) with some occasional loose stools diarrhea (grade 1) Patient did not take anything for the loose stools or the stomach pain which resolved on it's own. She also reports trouble sleeping for which she did take trazodone prn, insomnia (grade 2). Patient denies any nausea, vomiting, shortness of breath, swelling, fever, irritability, motor peripheral neuropathy, or rash in the past month.  Other reported AEs include ongoing daily moderate back ache (grade 2) which she takes tramadol and methocarbamol prn.  Patient continues to exercise swimming laps at the pool average of 3 days per week. See AE table below.    Maintenance therapy with Revlimid:  Patient returned completed cycle 109 Medication Calendar, confirming dosing at dose level -3, Revlimid 5mg  every other day, Days 1-21. Patient notes no missed doses of Revlimid this cycle.  Patient given printed appointment calendar with Cycle 110 treatment days marked, to document doses for the next treatment cycle, scheduled to begin today pending lab results.     Plan:  Research nurse will call patient after review of today's lab results to notify if okay to start next cycle of Revlimid today. If cycle starts today then next lab will be on 08/25/23 as scheduled in 4 weeks.     Maintenance Cycle 109 06/30/23-07/28/23 (end of Cycle 109 = 07/27/2023) Solicited &/or Reportable Events Grade Attribution to lenalidomide Cycle # Comments  Anemia Grade 1 Definite 109    Hyperglycemia Grade 0 -      Lymphocyte  count decreased Grade 0 -      Neutrophil count decreased Grade 2 Definite 109    Platelet count decreased Grade 0 -      Diarrhea Grade 1 Possible  109    Dyspnea Grade 0 -      Edema: limbs Grade 0 -      Fatigue  Grade 2  Probable 109 Moderate, occasionally limiting ADLs (unchanged from previous)  Fever Grade 0 -      Insomnia  Grade 2 Unlikely 109    Irritability Grade 0 -      Muscle weakness trunk Grade 0 -      Nausea Grade 0 -       Peripheral motor neuropathy Grade 0 -      Peripheral sensory neuropathy  Grade 2 Unlikely 109 Moderate symptoms, unchanged from previous  Rash acneiform Grade 0  -      Vomiting Grade 0 -        Non-reportable AEs (unsolicited, < Grade 3): Moderate back pain (grade 2) Decreased WBC (grade 1)  Left sided abd pain (grade 1)   Phone Call: Notified patient ANC result is okay to start cycle 110 of revlimid today.  Reminded patient to call if any questions or concerns prior to next visit and she verbalized understanding.     Domenica Reamer, BSN, RN, [employer] Research Nurse II [phone] 07/28/2023 8:01 AM

## 2023-08-13 DIAGNOSIS — H40022 Open angle with borderline findings, high risk, left eye: Secondary | ICD-10-CM | POA: Diagnosis not present

## 2023-08-13 DIAGNOSIS — H401111 Primary open-angle glaucoma, right eye, mild stage: Secondary | ICD-10-CM | POA: Diagnosis not present

## 2023-08-16 ENCOUNTER — Other Ambulatory Visit: Payer: Self-pay | Admitting: Hematology and Oncology

## 2023-08-16 DIAGNOSIS — C9001 Multiple myeloma in remission: Secondary | ICD-10-CM

## 2023-08-25 ENCOUNTER — Other Ambulatory Visit: Payer: Self-pay | Admitting: *Deleted

## 2023-08-25 ENCOUNTER — Other Ambulatory Visit: Payer: Self-pay

## 2023-08-25 ENCOUNTER — Telehealth: Payer: Self-pay | Admitting: *Deleted

## 2023-08-25 ENCOUNTER — Inpatient Hospital Stay: Payer: Medicare Other | Attending: Hematology and Oncology

## 2023-08-25 ENCOUNTER — Encounter: Payer: Self-pay | Admitting: *Deleted

## 2023-08-25 DIAGNOSIS — C9001 Multiple myeloma in remission: Secondary | ICD-10-CM

## 2023-08-25 DIAGNOSIS — Z006 Encounter for examination for normal comparison and control in clinical research program: Secondary | ICD-10-CM | POA: Insufficient documentation

## 2023-08-25 LAB — CBC WITH DIFFERENTIAL (CANCER CENTER ONLY)
Abs Immature Granulocytes: 0.01 10*3/uL (ref 0.00–0.07)
Basophils Absolute: 0 10*3/uL (ref 0.0–0.1)
Basophils Relative: 1 %
Eosinophils Absolute: 0.1 10*3/uL (ref 0.0–0.5)
Eosinophils Relative: 2 %
HCT: 37.6 % (ref 36.0–46.0)
Hemoglobin: 11.5 g/dL — ABNORMAL LOW (ref 12.0–15.0)
Immature Granulocytes: 0 %
Lymphocytes Relative: 55 %
Lymphs Abs: 2.2 10*3/uL (ref 0.7–4.0)
MCH: 22.5 pg — ABNORMAL LOW (ref 26.0–34.0)
MCHC: 30.6 g/dL (ref 30.0–36.0)
MCV: 73.4 fL — ABNORMAL LOW (ref 80.0–100.0)
Monocytes Absolute: 0.5 10*3/uL (ref 0.1–1.0)
Monocytes Relative: 13 %
Neutro Abs: 1.1 10*3/uL — ABNORMAL LOW (ref 1.7–7.7)
Neutrophils Relative %: 29 %
Platelet Count: 200 10*3/uL (ref 150–400)
RBC: 5.12 MIL/uL — ABNORMAL HIGH (ref 3.87–5.11)
RDW: 15.7 % — ABNORMAL HIGH (ref 11.5–15.5)
WBC Count: 4 10*3/uL (ref 4.0–10.5)
nRBC: 0 % (ref 0.0–0.2)

## 2023-08-25 NOTE — Research (Signed)
  Research - [facility] ECOG-ACRIN [id number] Maintenance Cycle 111, Day 1  Patient into clinic unaccompanied today for CBC and toxicity assessment prior to beginning treatment Cycle 111 of revlimid.     Lab: CBC drawn per protocol.    Adverse Events:  Patient reported the following since last visit; Ongoing fatigue (grade 2) unchanged, not limiting self-care. 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 1 episode of stomach ache with loose stool diarrhea (grade 1)  Patient did not take anything for the loose stools or the stomach pain which resolved on it's own. Patient denies insomnia, nausea, vomiting, shortness of breath, swelling, fever, irritability, motor peripheral neuropathy, or rash in the past month.  Other reported AEs include ongoing daily moderate back ache (grade 2) which she takes tramadol and methocarbamol prn.  Patient continues to exercise swimming laps at the pool average of 3 days per week. See AE table below.    Maintenance therapy with Revlimid:  Patient returned completed cycle 110 Medication Calendar, confirming dosing at dose level -3, Revlimid 5mg  every other day, Days 1-21. Patient notes no missed doses of Revlimid this cycle.  Patient given printed appointment calendar with Cycle 111 treatment days marked, to document doses for the next treatment cycle, scheduled to begin today pending lab results.     Plan:  Research nurse will call patient after review of today's lab results to notify if okay to start next cycle of Revlimid today. If cycle starts today, then next lab will be on 09/15/23 for myeloma labs 1 week prior to MD appointment on 09/22/23 as scheduled. Patient verbalized understanding.   Maintenance Cycle 110 07/28/23-08/25/23 (end of Cycle 110 = 08/24/2023) Solicited &/or Reportable Events Grade Attribution to lenalidomide Cycle # Comments  Anemia Grade 1 Definite 109, 110    Hyperglycemia Grade 0 -      Lymphocyte count decreased Grade 0 -       Neutrophil count decreased Grade 2 Definite 109, 110    Platelet count decreased Grade 0 -      Diarrhea Grade 1 Possible  109, 110    Dyspnea Grade 0 -      Edema: limbs Grade 0 -      Fatigue  Grade 2  Probable 109, 110 Moderate, occasionally limiting ADLs (unchanged from previous)  Fever Grade 0 -      Insomnia  Grade 2 Unlikely 109    Irritability Grade 0 -      Muscle weakness trunk Grade 0 -      Nausea Grade 0 -       Peripheral motor neuropathy Grade 0 -      Peripheral sensory neuropathy  Grade 2 Unlikely 109, 110 Moderate symptoms, unchanged from previous  Rash acneiform Grade 0  -      Vomiting Grade 0 -        Non-reportable AEs (unsolicited, < Grade 3): Moderate back pain (grade 2) Decreased WBC (grade 1)  Stomach Ache (grade 1)   Phone Call: Notified patient ANC result is okay to start cycle 111 of revlimid today.  Reminded patient to call if any questions or concerns prior to next visit and she verbalized understanding.     Domenica Reamer, BSN, RN, [employer] Research Nurse II [phone] 08/25/2023 8:24 AM

## 2023-08-25 NOTE — Telephone Encounter (Signed)
Note opened in error.

## 2023-09-12 ENCOUNTER — Other Ambulatory Visit: Payer: Self-pay | Admitting: Hematology and Oncology

## 2023-09-12 DIAGNOSIS — C9001 Multiple myeloma in remission: Secondary | ICD-10-CM

## 2023-09-14 NOTE — Telephone Encounter (Signed)
Please refill electronically

## 2023-09-15 ENCOUNTER — Inpatient Hospital Stay: Payer: Medicare Other | Attending: Hematology and Oncology

## 2023-09-15 ENCOUNTER — Telehealth: Payer: Self-pay | Admitting: *Deleted

## 2023-09-15 DIAGNOSIS — G8929 Other chronic pain: Secondary | ICD-10-CM | POA: Diagnosis not present

## 2023-09-15 DIAGNOSIS — Z79890 Hormone replacement therapy (postmenopausal): Secondary | ICD-10-CM | POA: Diagnosis not present

## 2023-09-15 DIAGNOSIS — M549 Dorsalgia, unspecified: Secondary | ICD-10-CM | POA: Diagnosis not present

## 2023-09-15 DIAGNOSIS — H109 Unspecified conjunctivitis: Secondary | ICD-10-CM | POA: Insufficient documentation

## 2023-09-15 DIAGNOSIS — G4700 Insomnia, unspecified: Secondary | ICD-10-CM | POA: Diagnosis not present

## 2023-09-15 DIAGNOSIS — C9001 Multiple myeloma in remission: Secondary | ICD-10-CM | POA: Diagnosis not present

## 2023-09-15 LAB — CBC WITH DIFFERENTIAL (CANCER CENTER ONLY)
Abs Immature Granulocytes: 0.01 10*3/uL (ref 0.00–0.07)
Basophils Absolute: 0 10*3/uL (ref 0.0–0.1)
Basophils Relative: 1 %
Eosinophils Absolute: 0.1 10*3/uL (ref 0.0–0.5)
Eosinophils Relative: 4 %
HCT: 37.9 % (ref 36.0–46.0)
Hemoglobin: 11.8 g/dL — ABNORMAL LOW (ref 12.0–15.0)
Immature Granulocytes: 0 %
Lymphocytes Relative: 50 %
Lymphs Abs: 1.7 10*3/uL (ref 0.7–4.0)
MCH: 22.8 pg — ABNORMAL LOW (ref 26.0–34.0)
MCHC: 31.1 g/dL (ref 30.0–36.0)
MCV: 73.3 fL — ABNORMAL LOW (ref 80.0–100.0)
Monocytes Absolute: 0.4 10*3/uL (ref 0.1–1.0)
Monocytes Relative: 13 %
Neutro Abs: 1.1 10*3/uL — ABNORMAL LOW (ref 1.7–7.7)
Neutrophils Relative %: 32 %
Platelet Count: 219 10*3/uL (ref 150–400)
RBC: 5.17 MIL/uL — ABNORMAL HIGH (ref 3.87–5.11)
RDW: 15.8 % — ABNORMAL HIGH (ref 11.5–15.5)
WBC Count: 3.3 10*3/uL — ABNORMAL LOW (ref 4.0–10.5)
nRBC: 0 % (ref 0.0–0.2)

## 2023-09-15 LAB — CMP (CANCER CENTER ONLY)
ALT: 22 U/L (ref 0–44)
AST: 21 U/L (ref 15–41)
Albumin: 4 g/dL (ref 3.5–5.0)
Alkaline Phosphatase: 55 U/L (ref 38–126)
Anion gap: 5 (ref 5–15)
BUN: 10 mg/dL (ref 8–23)
CO2: 29 mmol/L (ref 22–32)
Calcium: 9.4 mg/dL (ref 8.9–10.3)
Chloride: 105 mmol/L (ref 98–111)
Creatinine: 0.94 mg/dL (ref 0.44–1.00)
GFR, Estimated: >60 mL/min (ref >60)
Glucose, Bld: 88 mg/dL (ref 70–99)
Potassium: 3.8 mmol/L (ref 3.5–5.1)
Sodium: 139 mmol/L (ref 135–145)
Total Bilirubin: 0.7 mg/dL (ref 0.3–1.2)
Total Protein: 7.7 g/dL (ref 6.5–8.1)

## 2023-09-15 LAB — TSH: TSH: 2.818 u[IU]/mL (ref 0.350–4.500)

## 2023-09-15 LAB — LACTATE DEHYDROGENASE: LDH: 148 U/L (ref 98–192)

## 2023-09-15 NOTE — Telephone Encounter (Signed)
[id number]: Informed patient that her ANC result today is 1.1 and no need to recheck next week before she sees Dr. Bertis Ruddy.  Patient confirmed her appt next week on Tuesday 10/22 at 8:20 am.  Domenica Reamer, BSN, RN, [facility] Clinical Research Nurse II [phone] 09/15/2023 10:54 AM

## 2023-09-16 LAB — KAPPA/LAMBDA LIGHT CHAINS
Kappa free light chain: 31 mg/L — ABNORMAL HIGH (ref 3.3–19.4)
Kappa, lambda light chain ratio: 1.6 (ref 0.26–1.65)
Lambda free light chains: 19.4 mg/L (ref 5.7–26.3)

## 2023-09-17 LAB — MULTIPLE MYELOMA PANEL, SERUM
Albumin SerPl Elph-Mcnc: 3.7 g/dL (ref 2.9–4.4)
Albumin/Glob SerPl: 1.1 (ref 0.7–1.7)
Alpha 1: 0.2 g/dL (ref 0.0–0.4)
Alpha2 Glob SerPl Elph-Mcnc: 0.6 g/dL (ref 0.4–1.0)
B-Globulin SerPl Elph-Mcnc: 1.1 g/dL (ref 0.7–1.3)
Gamma Glob SerPl Elph-Mcnc: 1.8 g/dL (ref 0.4–1.8)
Globulin, Total: 3.7 g/dL (ref 2.2–3.9)
IgA: 119 mg/dL (ref 87–352)
IgG (Immunoglobin G), Serum: 1889 mg/dL — ABNORMAL HIGH (ref 586–1602)
IgM (Immunoglobulin M), Srm: 56 mg/dL (ref 26–217)
Total Protein ELP: 7.4 g/dL (ref 6.0–8.5)

## 2023-09-22 ENCOUNTER — Encounter: Payer: Self-pay | Admitting: Hematology and Oncology

## 2023-09-22 ENCOUNTER — Other Ambulatory Visit: Payer: Self-pay | Admitting: *Deleted

## 2023-09-22 ENCOUNTER — Encounter: Payer: Self-pay | Admitting: *Deleted

## 2023-09-22 ENCOUNTER — Telehealth: Payer: Self-pay | Admitting: Hematology and Oncology

## 2023-09-22 ENCOUNTER — Inpatient Hospital Stay: Payer: Medicare Other | Admitting: Hematology and Oncology

## 2023-09-22 VITALS — BP 139/85 | HR 57 | Temp 97.4°F | Resp 18 | Ht 67.0 in | Wt 163.4 lb

## 2023-09-22 DIAGNOSIS — Z006 Encounter for examination for normal comparison and control in clinical research program: Secondary | ICD-10-CM | POA: Diagnosis not present

## 2023-09-22 DIAGNOSIS — M549 Dorsalgia, unspecified: Secondary | ICD-10-CM | POA: Diagnosis not present

## 2023-09-22 DIAGNOSIS — T451X5A Adverse effect of antineoplastic and immunosuppressive drugs, initial encounter: Secondary | ICD-10-CM | POA: Diagnosis not present

## 2023-09-22 DIAGNOSIS — D701 Agranulocytosis secondary to cancer chemotherapy: Secondary | ICD-10-CM

## 2023-09-22 DIAGNOSIS — C9001 Multiple myeloma in remission: Secondary | ICD-10-CM

## 2023-09-22 DIAGNOSIS — M545 Low back pain, unspecified: Secondary | ICD-10-CM

## 2023-09-22 DIAGNOSIS — G8929 Other chronic pain: Secondary | ICD-10-CM | POA: Diagnosis not present

## 2023-09-22 DIAGNOSIS — H109 Unspecified conjunctivitis: Secondary | ICD-10-CM | POA: Diagnosis not present

## 2023-09-22 DIAGNOSIS — G4700 Insomnia, unspecified: Secondary | ICD-10-CM | POA: Diagnosis not present

## 2023-09-22 NOTE — Progress Notes (Signed)
[facility] OFFICE PROGRESS NOTE  Patient Care Team: Georgann Housekeeper, MD as PCP - General (Internal Medicine) Artis Delay, MD as Consulting Physician (Hematology and Oncology)  HISTORY OF PRESENTING ILLNESS: Discussed the use of AI scribe software for clinical note transcription with the patient, who gave verbal consent to proceed.  History of Present Illness   The patient, with a history of multiple myeloma on research study with maintenance Revlimid, presents with complaints of insomnia and recurrent conjunctivitis. She reports having difficulty sleeping for the past month, with no identifiable cause. Despite feeling tired, she has been taking one to two-hour naps during the day. She has also been experiencing recurrent episodes of conjunctivitis, which she has been managing with eye drops as prescribed by her eye doctor.  In addition to these issues, the patient continues to experience intermittent chronic back pain, which she believes to be muscular in nature. She engages in regular swimming and stationary biking for exercise.  The patient also reports a new onset of pain in the upper back, which she attributes to her posture during stationary biking. Despite these issues, the patient's recent lab tests show stable low white count and hemoglobin levels, which are not believed to be related to her current treatment regimen.         Assessment and Plan    Multiple Myeloma She has chronic Low White Count and Hemoglobin with Revlimid, asymptomatic Recent myeloma panels showed complete remission -No changes to current management plan.    Insomnia Poor sleep hygiene with daytime napping. Discussed the importance of maintaining a consistent sleep schedule and avoiding daytime naps. -Advised to improve sleep hygiene by avoiding daytime naps and maintaining a consistent sleep schedule.  Musculoskeletal Back Pain Likely related to posture during stationary biking. No  significant findings on previous scan. -Advised to modify biking posture and consider shorter, more frequent biking sessions to avoid prolonged static positions.  Conjunctivitis Managed by ophthalmologist. -No changes to current management plan.    Follow-up Next year.          No orders of the defined types were placed in this encounter.   All questions were answered. The patient knows to call the clinic with any problems, questions or concerns. The total time spent in the appointment was 25 minutes encounter with patients including review of chart and various tests results, discussions about plan of care and coordination of care plan   Artis Delay, MD 09/22/2023 8:46 AM  REVIEW OF SYSTEMS:  All other systems were reviewed with the patient and are negative.  I have reviewed the past medical history, past surgical history, social history and family history with the patient and they are unchanged from previous note.  ALLERGIES:  is allergic to metformin.  MEDICATIONS:  Current Outpatient Medications  Medication Sig Dispense Refill   aspirin 325 MG tablet Take 325 mg by mouth daily.      Cholecalciferol (VITAMIN D3) 2000 UNITS TABS Take 2,000 Units by mouth daily.     dorzolamide-timolol (COSOPT) 22.3-6.8 MG/ML ophthalmic solution Place 1 drop into both eyes 2 (two) times daily.     Galcanezumab-gnlm (EMGALITY) 120 MG/ML SOAJ INJECT 120MG  (1 PEN) UNDER THE SKIN EVERY 4 WEEKS. 3 mL 2   hydrochlorothiazide (HYDRODIURIL) 25 MG tablet Take 25 mg by mouth every evening.      lenalidomide (REVLIMID) 5 MG capsule TAKE 1 CAPSULE BY MOUTH EVERY  OTHER DAY FOR 21 DAYS ON, THEN 7 DAYS OFF 11 capsule 0  levothyroxine (SYNTHROID, LEVOTHROID) 75 MCG tablet Take 75 mcg by mouth daily before breakfast.     methocarbamol (ROBAXIN) 500 MG tablet Take 1 tablet (500 mg total) by mouth every 8 (eight) hours as needed for muscle spasms. 90 tablet 1   Multiple Vitamins-Minerals (CENTRUM SILVER PO)  Take 1 tablet by mouth daily.      polyethylene glycol (MIRALAX / GLYCOLAX) packet Take 17 g by mouth daily as needed for mild constipation.      RESTASIS 0.05 % ophthalmic emulsion PLACE 1 DROP IN BOTH EYES IN THE EVENING  4   rosuvastatin (CRESTOR) 5 MG tablet Take 5 mg by mouth daily at 2 PM.     sitaGLIPtin (JANUVIA) 50 MG tablet Take 50 mg by mouth daily.     traMADol (ULTRAM) 50 MG tablet Take by mouth every 6 (six) hours as needed.     traZODone (DESYREL) 50 MG tablet TAKE 1 TABLET (50 MG TOTAL) BY MOUTH AT BEDTIME. 90 tablet 3   venlafaxine XR (EFFEXOR-XR) 150 MG 24 hr capsule Take 1 capsule (150 mg total) by mouth daily with breakfast. 90 capsule 3   verapamil (VERELAN PM) 120 MG 24 hr capsule Take 1 capsule (120 mg total) by mouth daily. 90 capsule 3   No current facility-administered medications for this visit.   Facility-Administered Medications Ordered in Other Visits  Medication Dose Route Frequency Provider Last Rate Last Admin   gadopentetate dimeglumine (MAGNEVIST) injection 15 mL  15 mL Intravenous Once PRN Anson Fret, MD        SUMMARY OF ONCOLOGIC HISTORY: Oncology History Overview Note  ISS stage 1 IgG lambda subtype (serum albumin 3.6, Beta2 microglobulin 2.32) Durie Salmon Stage 1   Multiple myeloma in remission (HCC)  10/10/2013 Imaging   Skeletal survery was negative   11/09/2013 Bone Marrow Biopsy   BM biopsy confirmed myeloma, 76% involved, IgG lambda subtype   12/06/2013 - 08/29/2014 Chemotherapy   Sh received chemo with revlimid, Velcade, Dexamethasone and Zometa. 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   05/05/2014 Imaging   Imaging study of the neck showed no explanation that could cause right neck pain. She is noted to have incidental left upper lung nodule. Plan to  repeat imaging study in 3 months.   09/06/2014 Imaging   Bone survey showed no evidence of fracture   09/14/2014 Bone Marrow Biopsy   Bone marrow biopsy showed 8% residual plasma cells by manual count but none on the biopsy specimen   09/26/2014 -  Chemotherapy   She is started on cycle 1 of maintenance Revlimid   01/31/2015 Imaging    chest x-ray showed pneumonia. Treatment was placed on hold.   05/03/2015 Bone Marrow Biopsy   Accession: [id number] repeat bone marrow aspirate and biopsy show 5% residual plasma cells   10/14/2016 Bone Marrow Biopsy   Bone marrow biopsy showed the plasma cells represent 4% of all cells with lack of large aggregates or sheets. To assess the plasma cell clonality, immunohistochemical stains is performed and it lack clonality. Normal cytogenetics and FISH   01/09/2017 Imaging   CT chest showed ground-glass 1.5 cm apical left upper lobe pulmonary nodule. Initial follow-up with CT at 6-12 months is recommended to confirm persistence. If persistent, repeat CT is recommended every 2 years until 5 years  of stability has been established. This recommendation follows the consensus statement: Guidelines for Management of Incidental Pulmonary Nodules Detected on CT Images: From the Fleischner Society 2017; Radiology 2017; 284:228-243. 2. Mild patchy ground-glass opacities in the right upper lobe, probably inflammatory, which can also be reassessed on follow-up chest CT performed for the above dominant ground-glass nodule. 3. Solitary 3 mm right lower lobe solid pulmonary nodule, which can also be reassessed on follow-up chest CT performed for the above dominant ground-glass nodule. 4. No thoracic adenopathy. 5. Aortic atherosclerosis.  Two-vessel coronary atherosclerosis.   06/30/2017 Imaging   1. No interval change in the 11 x 13 mm ground-glass nodule left upper lobe. Given the nearly 6 months of imaging stability, repeat CT is recommended every 2 years until 5 years of  stability has been established. This recommendation follows the consensus statement: Guidelines for Management of Incidental Pulmonary Nodules Detected on CT Images: From the Fleischner Society 2017; Radiology 2017; 284:228-243. 2. Interval resolution of the tiny patchy ground-glass nodules right upper lobe, likely infectious/inflammatory etiology. 3. Stable 3 mm right lower lobe pulmonary nodule. Attention on follow-up recommended. 4.  Aortic Atherosclerois (ICD10-170.0)   09/29/2017 Imaging   Skeletal survey 1. Questionable new tiny lucency noted the posterior portion of C3 vertebral body. This may represent a small lytic lesion.  2. No definite thoracic lesion noted on today's exam. Stable lucencies in the left ilium and acetabulum.  3. No other focal abnormalities identified. The left hip is unremarkable .   04/16/2021 - 08/05/2022 Chemotherapy   Patient is on Treatment Plan : MYELOMA Research [facility] [id number] Arm D Maintenance Lenalidomide q28d     06/17/2022 Imaging   There are no focal lytic lesions. Other findings as described in the body of the report. Overall, no significant interval changes are noted since 07/31/2020.       PHYSICAL EXAMINATION: ECOG PERFORMANCE STATUS: 1 - Symptomatic but completely ambulatory  Vitals:   09/22/23 0827  BP: 139/85  Pulse: (!) 57  Resp: 18  Temp: (!) 97.4 F (36.3 C)  SpO2: 99%   Filed Weights   09/22/23 0827  Weight: 163 lb 6.4 oz (74.1 kg)    GENERAL:alert, no distress and comfortable  LABORATORY DATA:  I have reviewed the data as listed    Component Value Date/Time   NA 139 09/15/2023 0732   NA 141 09/29/2017 0830   K 3.8 09/15/2023 0732   K 3.6 09/29/2017 0830   CL 105 09/15/2023 0732   CO2 29 09/15/2023 0732   CO2 29 09/29/2017 0830   GLUCOSE 88 09/15/2023 0732   GLUCOSE 87 09/29/2017 0830   BUN 10 09/15/2023 0732   BUN 14.0 09/29/2017 0830   CREATININE 0.94 09/15/2023 0732   CREATININE 0.9 09/29/2017 0830   CALCIUM  9.4 09/15/2023 0732   CALCIUM 9.6 09/29/2017 0830   PROT 7.7 09/15/2023 0732   PROT 7.3 09/29/2017 0832   PROT 8.0 09/29/2017 0830   ALBUMIN 4.0 09/15/2023 0732   ALBUMIN 3.8 09/29/2017 0830   AST 21 09/15/2023 0732   AST 21 09/29/2017 0830   ALT 22 09/15/2023 0732   ALT 69 (H) 09/29/2017 0830   ALKPHOS 55 09/15/2023 0732   ALKPHOS 60 09/29/2017 0830   BILITOT 0.7 09/15/2023 0732   BILITOT 0.46 09/29/2017 0830   GFRNONAA >60 09/15/2023 0732   GFRAA >60 07/31/2020 0737    No results found for: "SPEP", "UPEP"  Lab Results  Component Value Date   WBC 3.3 (L)  09/15/2023   NEUTROABS 1.1 (L) 09/15/2023   HGB 11.8 (L) 09/15/2023   HCT 37.9 09/15/2023   MCV 73.3 (L) 09/15/2023   PLT 219 09/15/2023      Chemistry      Component Value Date/Time   NA 139 09/15/2023 0732   NA 141 09/29/2017 0830   K 3.8 09/15/2023 0732   K 3.6 09/29/2017 0830   CL 105 09/15/2023 0732   CO2 29 09/15/2023 0732   CO2 29 09/29/2017 0830   BUN 10 09/15/2023 0732   BUN 14.0 09/29/2017 0830   CREATININE 0.94 09/15/2023 0732   CREATININE 0.9 09/29/2017 0830      Component Value Date/Time   CALCIUM 9.4 09/15/2023 0732   CALCIUM 9.6 09/29/2017 0830   ALKPHOS 55 09/15/2023 0732   ALKPHOS 60 09/29/2017 0830   AST 21 09/15/2023 0732   AST 21 09/29/2017 0830   ALT 22 09/15/2023 0732   ALT 69 (H) 09/29/2017 0830   BILITOT 0.7 09/15/2023 0732   BILITOT 0.46 09/29/2017 0830

## 2023-09-22 NOTE — Telephone Encounter (Signed)
Spoke with patient confirming upcoming appointments  

## 2023-09-22 NOTE — Research (Signed)
Research - [facility] ECOG-ACRIN [id number] Maintenance Cycle 112, Day 1  Patient into clinic unaccompanied today for evaluation prior to beginning treatment Cycle 112 of revlimid.     Labs w/myeloma panel: Blood drawn on 09/15/2023 within 7-day protocol window to allow adequate time for results prior to treatment. All results were reviewed by Dr. Bertis Ruddy and found to be within parameters for continued dosing at Maintenance phase Dose Level -3, Revlimid 5 mg every other day for 21 days, every 28 days. Review of myeloma panel shows evidence of continued Complete Response.   Per Dr. Bertis Ruddy, bone marrow biopsy and aspirate would not be considered standard of care at this time. In addition, Dr. Bertis Ruddy states that 24 hr urine is not required to assess disease response.   History and physical exam: See MD note dated today.  Maintenance therapy with Revlimid:  Patient returned completed cycle 111 Medication Calendar, confirming dosing at dose level -3, Revlimid 5mg  every other day, Days 1-21. Patient notes no missed doses of Revlimid this cycle.  Patient given printed appointment calendar with Cycle 112 treatment days marked, to document doses for the next treatment cycle to begin today.   Adverse Events:  Patient reported the following since last visit; Ongoing fatigue (grade 2) unchanged, not limiting self-care. 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 Insomnia (grade 2) which patient reports needing to take trazodone to sleep and unable to sleep without it most nights. Patient denies diarrhea, nausea, vomiting, shortness of breath, swelling, fever, irritability, motor peripheral neuropathy, or rash in the past month.  Other reported AEs include ongoing daily moderate back ache (grade 2) which she takes tramadol and methocarbamol prn. She also reports recurrent conjunctivitis (grade 2) using eye drops prescribed by ophthalmologist.  Patient continues to exercise by swimming laps  at the pool average of 3 days per week and riding her stationary bike several times per week. See AE table below.    Maintenance Cycle 109-111 06/30/2023-09/22/23 (end of Cycle 111 = 09/21/23) Solicited &/or Reportable Events Grade Attribution to lenalidomide Cycle # Comments  Anemia Grade 1 Definite 109, 110, 111    Hyperglycemia Grade 0 -      Lymphocyte count decreased Grade 0 -      Neutrophil count decreased Grade 2 Definite 109, 110, 111    Platelet count decreased Grade 0 -      Diarrhea Grade 1 Possible  109, 110    Dyspnea Grade 0 -      Edema: limbs Grade 0 -      Fatigue  Grade 2  Probable 109, 110, 111 Moderate, occasionally limiting ADLs (unchanged from previous)  Fever Grade 0 -      Insomnia  Grade 2 Unlikely 109, 111    Irritability Grade 0 -      Muscle weakness trunk Grade 0 -      Nausea Grade 0 -       Peripheral motor neuropathy Grade 0 -      Peripheral sensory neuropathy  Grade 2 Unlikely 109, 110, 111 Moderate symptoms, unchanged from previous  Rash acneiform Grade 0  -      Vomiting Grade 0 -        Non-reportable AEs (unsolicited, < Grade 3): Moderate back pain (grade 2) Decreased WBC (grade 1)  Stomach Ache (grade 1)  Left sided abd pain (grade 1)  Conjunctivitis (grade 2)  Plan: Patient understands to start next cycle of Revlimid today.  Next lab appointment  will be scheduled for 10/20/23 to assess AEs and CBC prior to the next cycle of Revlimid.  Reminded patient to call our office if any new questions/concerns prior to next visit. She verbalized understanding.   Domenica Reamer, BSN, RN, [employer] Research Nurse II [phone] 09/22/2023 8:41 AM

## 2023-10-08 DIAGNOSIS — C9001 Multiple myeloma in remission: Secondary | ICD-10-CM | POA: Diagnosis not present

## 2023-10-08 DIAGNOSIS — D702 Other drug-induced agranulocytosis: Secondary | ICD-10-CM | POA: Diagnosis not present

## 2023-10-08 DIAGNOSIS — I700 Atherosclerosis of aorta: Secondary | ICD-10-CM | POA: Diagnosis not present

## 2023-10-08 DIAGNOSIS — E039 Hypothyroidism, unspecified: Secondary | ICD-10-CM | POA: Diagnosis not present

## 2023-10-08 DIAGNOSIS — E119 Type 2 diabetes mellitus without complications: Secondary | ICD-10-CM | POA: Diagnosis not present

## 2023-10-08 DIAGNOSIS — E559 Vitamin D deficiency, unspecified: Secondary | ICD-10-CM | POA: Diagnosis not present

## 2023-10-08 DIAGNOSIS — I10 Essential (primary) hypertension: Secondary | ICD-10-CM | POA: Diagnosis not present

## 2023-10-08 DIAGNOSIS — Z0000 Encounter for general adult medical examination without abnormal findings: Secondary | ICD-10-CM | POA: Diagnosis not present

## 2023-10-08 DIAGNOSIS — E785 Hyperlipidemia, unspecified: Secondary | ICD-10-CM | POA: Diagnosis not present

## 2023-10-08 DIAGNOSIS — E1139 Type 2 diabetes mellitus with other diabetic ophthalmic complication: Secondary | ICD-10-CM | POA: Diagnosis not present

## 2023-10-10 ENCOUNTER — Other Ambulatory Visit: Payer: Self-pay | Admitting: Hematology and Oncology

## 2023-10-10 DIAGNOSIS — C9001 Multiple myeloma in remission: Secondary | ICD-10-CM

## 2023-10-13 ENCOUNTER — Other Ambulatory Visit: Payer: Self-pay

## 2023-10-13 DIAGNOSIS — C9001 Multiple myeloma in remission: Secondary | ICD-10-CM

## 2023-10-13 MED ORDER — LENALIDOMIDE 5 MG PO CAPS
ORAL_CAPSULE | ORAL | 0 refills | Status: DC
Start: 2023-10-13 — End: 2023-11-09

## 2023-10-20 ENCOUNTER — Other Ambulatory Visit: Payer: Self-pay | Admitting: Nurse Practitioner

## 2023-10-20 ENCOUNTER — Encounter: Payer: Self-pay | Admitting: *Deleted

## 2023-10-20 ENCOUNTER — Other Ambulatory Visit (HOSPITAL_COMMUNITY)
Admission: RE | Admit: 2023-10-20 | Discharge: 2023-10-20 | Disposition: A | Discharge status: Home / Self Care | Payer: Medicare Other | Source: Ambulatory Visit

## 2023-10-20 ENCOUNTER — Inpatient Hospital Stay: Payer: Medicare Other | Attending: Hematology and Oncology

## 2023-10-20 DIAGNOSIS — C9001 Multiple myeloma in remission: Secondary | ICD-10-CM | POA: Diagnosis not present

## 2023-10-20 DIAGNOSIS — Z1151 Encounter for screening for human papillomavirus (HPV): Secondary | ICD-10-CM | POA: Insufficient documentation

## 2023-10-20 DIAGNOSIS — Z006 Encounter for examination for normal comparison and control in clinical research program: Secondary | ICD-10-CM | POA: Diagnosis not present

## 2023-10-20 DIAGNOSIS — D849 Immunodeficiency, unspecified: Secondary | ICD-10-CM | POA: Diagnosis not present

## 2023-10-20 DIAGNOSIS — Z124 Encounter for screening for malignant neoplasm of cervix: Secondary | ICD-10-CM | POA: Insufficient documentation

## 2023-10-20 DIAGNOSIS — Z9289 Personal history of other medical treatment: Secondary | ICD-10-CM | POA: Diagnosis not present

## 2023-10-20 DIAGNOSIS — Z01419 Encounter for gynecological examination (general) (routine) without abnormal findings: Secondary | ICD-10-CM | POA: Diagnosis not present

## 2023-10-20 LAB — CBC WITH DIFFERENTIAL (CANCER CENTER ONLY)
Abs Immature Granulocytes: 0 10*3/uL (ref 0.00–0.07)
Basophils Absolute: 0 10*3/uL (ref 0.0–0.1)
Basophils Relative: 1 %
Eosinophils Absolute: 0.1 10*3/uL (ref 0.0–0.5)
Eosinophils Relative: 3 %
HCT: 37.9 % (ref 36.0–46.0)
Hemoglobin: 11.5 g/dL — ABNORMAL LOW (ref 12.0–15.0)
Immature Granulocytes: 0 %
Lymphocytes Relative: 60 %
Lymphs Abs: 2.1 10*3/uL (ref 0.7–4.0)
MCH: 22.4 pg — ABNORMAL LOW (ref 26.0–34.0)
MCHC: 30.3 g/dL (ref 30.0–36.0)
MCV: 73.9 fL — ABNORMAL LOW (ref 80.0–100.0)
Monocytes Absolute: 0.4 10*3/uL (ref 0.1–1.0)
Monocytes Relative: 12 %
Neutro Abs: 0.8 10*3/uL — ABNORMAL LOW (ref 1.7–7.7)
Neutrophils Relative %: 24 %
Platelet Count: 208 10*3/uL (ref 150–400)
RBC: 5.13 MIL/uL — ABNORMAL HIGH (ref 3.87–5.11)
RDW: 15.5 % (ref 11.5–15.5)
WBC Count: 3.6 10*3/uL — ABNORMAL LOW (ref 4.0–10.5)
nRBC: 0 % (ref 0.0–0.2)

## 2023-10-20 NOTE — Research (Signed)
Research - [facility] ECOG-ACRIN [id number] Maintenance Cycle 113, Day 1  Patient into clinic unaccompanied today for CBC and toxicity assessment prior to beginning treatment Cycle 113 of revlimid.     Lab: CBC drawn per protocol.    Adverse Events:  Patient reported the following since last visit; Ongoing fatigue (grade 2) unchanged, not limiting self-care. 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 Diarrhea (grade 2)  x 5 stools/24 hr (baseline is 1 stool/24 hrs). Patient did not take anything for the loose stools which resolved on it's own. Insomnia (grade 2) and patient took trazodone PRN. She denies nausea, vomiting, shortness of breath, swelling, fever, irritability, motor peripheral neuropathy, or rash in the past month.  Other reported AEs include ongoing daily moderate back ache (grade 2) which she takes tramadol and methocarbamol prn.  Patient continues to exercise swimming laps at the pool average of 3 days per week. See AE table below.    Maintenance Cycle 112 09/22/23 -10/20/23 (end of Cycle 12 = 10/19/23) Solicited &/or Reportable Events Grade Attribution to lenalidomide Cycle # Comments  Anemia Grade 1 Definite  112    Hyperglycemia Grade 0 -      Lymphocyte count decreased Grade 0 -      Neutrophil count decreased Grade 3 Definite 112  Held revlimid, recheck CBC next week.   Platelet count decreased Grade 0 -      Diarrhea Grade 2 Possible  112  Baseline is 1 stool/ 24 hrs  Dyspnea Grade 0 -      Edema: limbs Grade 0 -      Fatigue  Grade 2  Probable 112 Moderate, occasionally limiting ADLs (unchanged from previous)  Fever Grade 0 -      Insomnia  Grade 2 Unlikely 112    Irritability Grade 0 -      Muscle weakness trunk Grade 0 -      Nausea Grade 0 -       Peripheral motor neuropathy Grade 0 -      Peripheral sensory neuropathy  Grade 2 Unlikely 112 Moderate symptoms, unchanged from previous  Rash acneiform Grade 0  -      Vomiting Grade 0 -         Non-reportable AEs (unsolicited, < Grade 3): Moderate back pain (grade 2) Decreased WBC (grade 1)   Maintenance therapy with Revlimid:  Patient returned completed cycle 112 Medication Calendar, confirming dosing at dose level -3, Revlimid 5mg  every other day, Days 1-21. Patient notes no missed doses of Revlimid this cycle.  Patient given printed appointment calendar with Cycle 113 treatment days marked, to document doses for the next treatment cycle, scheduled to begin today pending lab results.     Plan:  Research nurse will call patient after review of today's lab results to notify if okay to start next cycle of Revlimid today. If cycle starts today, then next lab will be on 11/17/23 for CBC prior to cycle 114 as scheduled. Patient verbalized understanding.   Phone Call: Notified patient ANC result is too low (0.8) to start next cycle of revlimid today.  Instructed patient to hold revlimid and return next week for repeat CBC.  Scheduling request has been sent for Lab on 10/27/23 at 7:30 am per patient preference. Patient verbalized understanding.  Reminded patient to call if any questions or concerns prior to next visit and she verbalized understanding.     Domenica Reamer, BSN, RN, [employer] Research Nurse II [phone] 10/20/2023  8:03 AM

## 2023-10-23 LAB — CYTOLOGY - PAP
Comment: NEGATIVE
Diagnosis: NEGATIVE
High risk HPV: NEGATIVE

## 2023-10-27 ENCOUNTER — Inpatient Hospital Stay: Payer: Medicare Other

## 2023-10-27 ENCOUNTER — Encounter: Payer: Self-pay | Admitting: *Deleted

## 2023-10-27 DIAGNOSIS — Z006 Encounter for examination for normal comparison and control in clinical research program: Secondary | ICD-10-CM | POA: Diagnosis not present

## 2023-10-27 DIAGNOSIS — C9001 Multiple myeloma in remission: Secondary | ICD-10-CM

## 2023-10-27 LAB — CBC WITH DIFFERENTIAL (CANCER CENTER ONLY)
Abs Immature Granulocytes: 0 10*3/uL (ref 0.00–0.07)
Basophils Absolute: 0 10*3/uL (ref 0.0–0.1)
Basophils Relative: 1 %
Eosinophils Absolute: 0.1 10*3/uL (ref 0.0–0.5)
Eosinophils Relative: 2 %
HCT: 38.1 % (ref 36.0–46.0)
Hemoglobin: 11.7 g/dL — ABNORMAL LOW (ref 12.0–15.0)
Immature Granulocytes: 0 %
Lymphocytes Relative: 54 %
Lymphs Abs: 2 10*3/uL (ref 0.7–4.0)
MCH: 22.7 pg — ABNORMAL LOW (ref 26.0–34.0)
MCHC: 30.7 g/dL (ref 30.0–36.0)
MCV: 73.8 fL — ABNORMAL LOW (ref 80.0–100.0)
Monocytes Absolute: 0.4 10*3/uL (ref 0.1–1.0)
Monocytes Relative: 12 %
Neutro Abs: 1.1 10*3/uL — ABNORMAL LOW (ref 1.7–7.7)
Neutrophils Relative %: 31 %
Platelet Count: 220 10*3/uL (ref 150–400)
RBC: 5.16 MIL/uL — ABNORMAL HIGH (ref 3.87–5.11)
RDW: 15.9 % — ABNORMAL HIGH (ref 11.5–15.5)
WBC Count: 3.7 10*3/uL — ABNORMAL LOW (ref 4.0–10.5)
nRBC: 0 % (ref 0.0–0.2)

## 2023-10-27 NOTE — Research (Signed)
Research - [facility] ECOG-ACRIN [id number] Maintenance Cycle 113, Day 1  Patient into clinic unaccompanied today for CBC and toxicity assessment prior to beginning treatment Cycle 113 of revlimid.  ANC was too low last week per protocol to start revlimid and patient returns today to recheck CBC.    Lab: CBC drawn per protocol.    Adverse Events:  Patient reported the following since last visit; Ongoing fatigue (grade 2) unchanged, not limiting self-care. 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 Insomnia (grade 2) and patient took trazodone PRN. She denies diarrhea, nausea, vomiting, shortness of breath, swelling, fever, irritability, motor peripheral neuropathy, or rash in the past month.  Other reported AEs include ongoing daily moderate back ache (grade 2) which she takes tramadol and methocarbamol prn.  Patient continues to exercise swimming laps at the pool average of 3 days per week. See AE table below.    Maintenance Cycle 112 09/22/23 -10/27/23 (end of Cycle 12 = 10/26/23) Solicited &/or Reportable Events Grade Attribution to lenalidomide Cycle # Comments  Anemia Grade 1 Definite  112    Hyperglycemia Grade 0 -      Lymphocyte count decreased Grade 0 -      Neutrophil count decreased Grade 2 Definite 112    Neutrophil cunt decreased Grade 3 Defintie      112   Platelet count decreased Grade 0 -      Diarrhea Grade 0 - 112  Baseline is 1 stool/ 24 hrs  Diarrhea Grade 2 Possible      112 Baseline is 1 stool/ 24 hrs  Dyspnea Grade 0 -      Edema: limbs Grade 0 -      Fatigue  Grade 2  Probable 112 Moderate, occasionally limiting ADLs (unchanged from previous)  Fever Grade 0 -      Insomnia  Grade 2 Unlikely 112    Irritability Grade 0 -      Muscle weakness trunk Grade 0 -      Nausea Grade 0 -       Peripheral motor neuropathy Grade 0 -      Peripheral sensory neuropathy  Grade 2 Unlikely 112 Moderate symptoms, unchanged from previous  Rash acneiform Grade  0  -      Vomiting Grade 0 -        Non-reportable AEs (unsolicited, < Grade 3): Moderate back pain (grade 2) Decreased WBC (grade 1)   Maintenance therapy with Revlimid:  Patient given printed appointment calendar with Cycle 113 treatment days marked, to document doses for the next treatment cycle, scheduled to begin today pending lab results.  Patient understands to continue treatment at same dose level -3, Revlimid 5mg  every other day, Days 1-21.     Plan:  Research nurse will call patient after review of today's lab results to notify if okay to start next cycle of Revlimid today. If cycle starts today, then next lab will be on 11/24/23 for CBC prior to cycle 114  and will be scheduled. Patient verbalized understanding.   Phone Call: Notified patient ANC result is okay to start next cycle of revlimid today.  Instructed patient to return on 11/24/23 for CBC at 7:30 am. Patient verbalized understanding.  Reminded patient to call if any questions or concerns prior to next visit and she verbalized understanding.     Domenica Reamer, BSN, RN, [employer] Research Nurse II [phone] 10/27/2023 7:56 AM

## 2023-11-07 ENCOUNTER — Other Ambulatory Visit: Payer: Self-pay | Admitting: Hematology and Oncology

## 2023-11-07 DIAGNOSIS — C9001 Multiple myeloma in remission: Secondary | ICD-10-CM

## 2023-11-10 ENCOUNTER — Other Ambulatory Visit: Payer: Self-pay

## 2023-11-10 DIAGNOSIS — C9001 Multiple myeloma in remission: Secondary | ICD-10-CM

## 2023-11-10 MED ORDER — LENALIDOMIDE 5 MG PO CAPS: ORAL_CAPSULE | ORAL | 0 refills | Status: DC

## 2023-11-17 ENCOUNTER — Inpatient Hospital Stay: Payer: Medicare Other

## 2023-11-24 ENCOUNTER — Encounter: Payer: Self-pay | Admitting: *Deleted

## 2023-11-24 ENCOUNTER — Inpatient Hospital Stay: Payer: Medicare Other | Attending: Hematology and Oncology

## 2023-11-24 DIAGNOSIS — C9001 Multiple myeloma in remission: Secondary | ICD-10-CM

## 2023-11-24 DIAGNOSIS — Z006 Encounter for examination for normal comparison and control in clinical research program: Secondary | ICD-10-CM | POA: Insufficient documentation

## 2023-11-24 LAB — CBC WITH DIFFERENTIAL (CANCER CENTER ONLY)
Abs Immature Granulocytes: 0.01 10*3/uL (ref 0.00–0.07)
Basophils Absolute: 0 10*3/uL (ref 0.0–0.1)
Basophils Relative: 1 %
Eosinophils Absolute: 0.1 10*3/uL (ref 0.0–0.5)
Eosinophils Relative: 2 %
HCT: 38.5 % (ref 36.0–46.0)
Hemoglobin: 11.7 g/dL — ABNORMAL LOW (ref 12.0–15.0)
Immature Granulocytes: 0 %
Lymphocytes Relative: 52 %
Lymphs Abs: 1.8 10*3/uL (ref 0.7–4.0)
MCH: 22.3 pg — ABNORMAL LOW (ref 26.0–34.0)
MCHC: 30.4 g/dL (ref 30.0–36.0)
MCV: 73.5 fL — ABNORMAL LOW (ref 80.0–100.0)
Monocytes Absolute: 0.5 10*3/uL (ref 0.1–1.0)
Monocytes Relative: 15 %
Neutro Abs: 1.1 10*3/uL — ABNORMAL LOW (ref 1.7–7.7)
Neutrophils Relative %: 30 %
Platelet Count: 205 10*3/uL (ref 150–400)
RBC: 5.24 MIL/uL — ABNORMAL HIGH (ref 3.87–5.11)
RDW: 15.6 % — ABNORMAL HIGH (ref 11.5–15.5)
WBC Count: 3.5 10*3/uL — ABNORMAL LOW (ref 4.0–10.5)
nRBC: 0 % (ref 0.0–0.2)

## 2023-11-24 NOTE — Research (Signed)
Research - [facility] ECOG-ACRIN [id number] Maintenance Cycle 114, Day 1  Patient into clinic unaccompanied today for CBC and toxicity assessment prior to beginning treatment Cycle 114 of revlimid.     Lab: CBC drawn per protocol.    Adverse Events:  Patient reported the following since last visit; Ongoing fatigue (grade 2) unchanged, not limiting self-care. 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 She denies insomnia diarrhea, nausea, vomiting, shortness of breath, swelling, fever, irritability, motor peripheral neuropathy, or rash in the past month.   Other reported AEs include ongoing daily moderate back ache (grade 2) which she takes tramadol and methocarbamol prn.  She also reports one  left sided headache which was moderate but she did not take any pain medication and it resolved on it's own (grade 2).  Patient continues to exercise swimming laps at the pool average of 3 days per week. See AE table below.    Maintenance Cycle 113 10/27/23-11/24/23 (end of Cycle 113 = 11/23/23) Solicited &/or Reportable Events Grade Attribution to lenalidomide Cycle # Comments  Anemia Grade 1 Definite  112, 113    Hyperglycemia Grade 0 -      Lymphocyte count decreased Grade 0 -      Neutrophil count decreased Grade 2 Definite 112, 113    Neutrophil cunt decreased Grade 3 Defintie      112   Platelet count decreased Grade 0 -      Diarrhea Grade 0 -   Baseline is 1 stool/ 24 hrs  Diarrhea Grade 2 Possible      112 Baseline is 1 stool/ 24 hrs  Dyspnea Grade 0 -      Edema: limbs Grade 0 -      Fatigue  Grade 2  Probable 112, 113 Moderate, occasionally limiting ADLs (unchanged from previous)  Fever Grade 0 -      Insomnia Grade 0     Insomnia  Grade 2 Unlikely 112    Irritability Grade 0 -      Muscle weakness trunk Grade 0 -      Nausea Grade 0 -       Peripheral motor neuropathy Grade 0 -      Peripheral sensory neuropathy  Grade 2 Unlikely 112, 113 Moderate symptoms,  unchanged from previous  Rash acneiform Grade 0  -      Vomiting Grade 0 -        Non-reportable AEs (unsolicited, < Grade 3): Moderate back pain (grade 2) Decreased WBC (grade 1)  Headache left sided (grade 2)  Maintenance therapy with Revlimid:  Patient returned completed cycle 113 Medication Calendar, confirming dosing at dose level -3, Revlimid 5mg  every other day, Days 1-21. Patient notes no missed doses of Revlimid this cycle. Patient given printed appointment calendar with Cycle 114 treatment days marked, to document doses for the next treatment cycle, scheduled to begin today pending lab results.  Patient understands to continue treatment at same dose level -3, Revlimid 5mg  every other day, Days 1-21.     Plan:  Results were available prior to patient leaving clinic. CBC results within parameters to start next cycle of Revlimid today as planned. Patient to return to clinic in 3 weeks for labs one week prior to MD appointment. Lab scheduled for 12/15/23 and Dr. Bertis Ruddy on 12/22/23.  Patient verbalized understanding.   Reminded patient to call if any questions or concerns prior to next visit and she verbalized understanding.     Domenica Reamer, BSN,  RN, CCRP Clinical Research Nurse II [phone] 11/24/2023 7:49 AM

## 2023-12-08 ENCOUNTER — Inpatient Hospital Stay: Payer: Medicare Other | Attending: Hematology and Oncology

## 2023-12-10 ENCOUNTER — Other Ambulatory Visit: Payer: Self-pay | Admitting: Hematology and Oncology

## 2023-12-10 DIAGNOSIS — C9001 Multiple myeloma in remission: Secondary | ICD-10-CM

## 2023-12-11 ENCOUNTER — Other Ambulatory Visit: Payer: Self-pay

## 2023-12-11 DIAGNOSIS — C9001 Multiple myeloma in remission: Secondary | ICD-10-CM

## 2023-12-11 MED ORDER — LENALIDOMIDE 5 MG PO CAPS: ORAL_CAPSULE | ORAL | 0 refills | Status: DC

## 2023-12-15 ENCOUNTER — Inpatient Hospital Stay: Payer: Medicare Other | Admitting: Hematology and Oncology

## 2023-12-15 ENCOUNTER — Inpatient Hospital Stay: Payer: Medicare Other | Attending: Hematology and Oncology

## 2023-12-15 DIAGNOSIS — Z7961 Long term (current) use of immunomodulator: Secondary | ICD-10-CM | POA: Insufficient documentation

## 2023-12-15 DIAGNOSIS — Z79899 Other long term (current) drug therapy: Secondary | ICD-10-CM | POA: Insufficient documentation

## 2023-12-15 DIAGNOSIS — C9001 Multiple myeloma in remission: Secondary | ICD-10-CM | POA: Diagnosis present

## 2023-12-15 DIAGNOSIS — Z9221 Personal history of antineoplastic chemotherapy: Secondary | ICD-10-CM | POA: Insufficient documentation

## 2023-12-15 DIAGNOSIS — R768 Other specified abnormal immunological findings in serum: Secondary | ICD-10-CM | POA: Insufficient documentation

## 2023-12-15 DIAGNOSIS — Z006 Encounter for examination for normal comparison and control in clinical research program: Secondary | ICD-10-CM | POA: Insufficient documentation

## 2023-12-15 DIAGNOSIS — D61811 Other drug-induced pancytopenia: Secondary | ICD-10-CM | POA: Insufficient documentation

## 2023-12-15 LAB — CMP (CANCER CENTER ONLY)
ALT: 30 U/L (ref 0–44)
AST: 27 U/L (ref 15–41)
Albumin: 4.2 g/dL (ref 3.5–5.0)
Alkaline Phosphatase: 57 U/L (ref 38–126)
Anion gap: 7 (ref 5–15)
BUN: 13 mg/dL (ref 8–23)
CO2: 29 mmol/L (ref 22–32)
Calcium: 9.9 mg/dL (ref 8.9–10.3)
Chloride: 105 mmol/L (ref 98–111)
Creatinine: 0.9 mg/dL (ref 0.44–1.00)
GFR, Estimated: >60 mL/min (ref >60)
Glucose, Bld: 97 mg/dL (ref 70–99)
Potassium: 3.8 mmol/L (ref 3.5–5.1)
Sodium: 141 mmol/L (ref 135–145)
Total Bilirubin: 0.8 mg/dL (ref 0.0–1.2)
Total Protein: 7.9 g/dL (ref 6.5–8.1)

## 2023-12-15 LAB — CBC WITH DIFFERENTIAL (CANCER CENTER ONLY)
Abs Immature Granulocytes: 0 K/uL (ref 0.00–0.07)
Basophils Absolute: 0 K/uL (ref 0.0–0.1)
Basophils Relative: 1 %
Eosinophils Absolute: 0.1 K/uL (ref 0.0–0.5)
Eosinophils Relative: 3 %
HCT: 39.5 % (ref 36.0–46.0)
Hemoglobin: 12.3 g/dL (ref 12.0–15.0)
Immature Granulocytes: 0 %
Lymphocytes Relative: 51 %
Lymphs Abs: 1.7 K/uL (ref 0.7–4.0)
MCH: 22.6 pg — ABNORMAL LOW (ref 26.0–34.0)
MCHC: 31.1 g/dL (ref 30.0–36.0)
MCV: 72.5 fL — ABNORMAL LOW (ref 80.0–100.0)
Monocytes Absolute: 0.5 K/uL (ref 0.1–1.0)
Monocytes Relative: 14 %
Neutro Abs: 1 K/uL — ABNORMAL LOW (ref 1.7–7.7)
Neutrophils Relative %: 31 %
Platelet Count: 215 K/uL (ref 150–400)
RBC: 5.45 MIL/uL — ABNORMAL HIGH (ref 3.87–5.11)
RDW: 15.9 % — ABNORMAL HIGH (ref 11.5–15.5)
WBC Count: 3.3 K/uL — ABNORMAL LOW (ref 4.0–10.5)
nRBC: 0 % (ref 0.0–0.2)

## 2023-12-15 LAB — LACTATE DEHYDROGENASE: LDH: 160 U/L (ref 98–192)

## 2023-12-15 LAB — TSH: TSH: 1.102 u[IU]/mL (ref 0.350–4.500)

## 2023-12-16 LAB — KAPPA/LAMBDA LIGHT CHAINS
Kappa free light chain: 34.2 mg/L — ABNORMAL HIGH (ref 3.3–19.4)
Kappa, lambda light chain ratio: 1.69 — ABNORMAL HIGH (ref 0.26–1.65)
Lambda free light chains: 20.2 mg/L (ref 5.7–26.3)

## 2023-12-16 NOTE — Patient Instructions (Signed)
Below is our plan:  We will continue Emgality every 30 days. Continue verapamil, venlafaxine and tramadol per PCP.   Please make sure you are staying well hydrated. I recommend 50-60 ounces daily. Well balanced diet and regular exercise encouraged. Consistent sleep schedule with 6-8 hours recommended.   Please continue follow up with care team as directed.   Follow up with me in 1 year   You may receive a survey regarding today's visit. I encourage you to leave honest feed back as I do use this information to improve patient care. Thank you for seeing me today!   GENERAL HEADACHE INFORMATION:   Natural supplements: Magnesium Oxide or Magnesium Glycinate 500 mg at bed (up to 800 mg daily) Coenzyme Q10 300 mg in AM Vitamin B2- 200 mg twice a day   Add 1 supplement at a time since even natural supplements can have undesirable side effects. You can sometimes buy supplements cheaper (especially Coenzyme Q10) at www.WebmailGuide.co.za or at [facility].  Migraine with aura: There is increased risk for stroke in women with migraine with aura and a contraindication for the combined contraceptive pill for use by women who have migraine with aura. The risk for women with migraine without aura is lower. However other risk factors like smoking are far more likely to increase stroke risk than migraine. There is a recommendation for no smoking and for the use of OCPs without estrogen such as progestogen only pills particularly for women with migraine with aura.Marland Kitchen People who have migraine headaches with auras may be 3 times more likely to have a stroke caused by a blood clot, compared to migraine patients who don't see auras. Women who take hormone-replacement therapy may be 30 percent more likely to suffer a clot-based stroke than women not taking medication containing estrogen. Other risk factors like smoking and high blood pressure may be  much more important.    Vitamins and herbs that show potential:   Magnesium:  Magnesium (250 mg twice a day or 500 mg at bed) has a relaxant effect on smooth muscles such as blood vessels. Individuals suffering from frequent or daily headache usually have low magnesium levels which can be increase with daily supplementation of 400-750 mg. Three trials found 40-90% average headache reduction  when used as a preventative. Magnesium may help with headaches are aura, the best evidence for magnesium is for migraine with aura is its thought to stop the cortical spreading depression we believe is the pathophysiology of migraine aura.Magnesium also demonstrated the benefit in menstrually related migraine.  Magnesium is part of the messenger system in the serotonin cascade and it is a good muscle relaxant.  It is also useful for constipation which can be a side effect of other medications used to treat migraine. Good sources include nuts, whole grains, and tomatoes. Side Effects: loose stool/diarrhea  Riboflavin (vitamin B 2) 200 mg twice a day. This vitamin assists nerve cells in the production of ATP a principal energy storing molecule.  It is necessary for many chemical reactions in the body.  There have been at least 3 clinical trials of riboflavin using 400 mg per day all of which suggested that migraine frequency can be decreased.  All 3 trials showed significant improvement in over half of migraine sufferers.  The supplement is found in bread, cereal, milk, meat, and poultry.  Most Americans get more riboflavin than the recommended daily allowance, however riboflavin deficiency is not necessary for the supplements to help prevent headache. Side effects: energizing,  green urine   Coenzyme Q10: This is present in almost all cells in the body and is critical component for the conversion of energy.  Recent studies have shown that a nutritional supplement of CoQ10 can reduce the frequency of migraine attacks by improving the energy production of cells as with riboflavin.  Doses of 150 mg twice a  day have been shown to be effective.   Melatonin: Increasing evidence shows correlation between melatonin secretion and headache conditions.  Melatonin supplementation has decreased headache intensity and duration.  It is widely used as a sleep aid.  Sleep is natures way of dealing with migraine.  A dose of 3 mg is recommended to start for headaches including cluster headache. Higher doses up to 15 mg has been reviewed for use in Cluster headache and have been used. The rationale behind using melatonin for cluster is that many theories regarding the cause of Cluster headache center around the disruption of the normal circadian rhythm in the brain.  This helps restore the normal circadian rhythm.   HEADACHE DIET: Foods and beverages which may trigger migraine Note that only 20% of headache patients are food sensitive. You will know if you are food sensitive if you get a headache consistently 20 minutes to 2 hours after eating a certain food. Only cut out a food if it causes headaches, otherwise you might remove foods you enjoy! What matters most for diet is to eat a well balanced healthy diet full of vegetables and low fat protein, and to not miss meals.   Chocolate, other sweets ALL cheeses except cottage and cream cheese Dairy products, yogurt, sour cream, ice cream Liver Meat extracts (Bovril, Marmite, meat tenderizers) Meats or fish which have undergone aging, fermenting, pickling or smoking. These include: Hotdogs,salami,Lox,sausage, mortadellas,smoked salmon, pepperoni, Pickled herring Pods of broad bean (English beans, Chinese pea pods, [place] (fava) beans, lima and navy beans Ripe avocado, ripe banana Yeast extracts or active yeast preparations such as Brewer's or Fleishman's (commercial bakes goods are permitted) Tomato based foods, pizza (lasagna, etc.)   MSG (monosodium glutamate) is disguised as many things; look for these common aliases: Monopotassium glutamate Autolysed  yeast Hydrolysed protein Sodium caseinate "flavorings" "all natural preservatives" Nutrasweet   Avoid all other foods that convincingly provoke headaches.   Resources: The Dizzy [place] Laundry Your Headache Diet, migrainestrong.com  https://zamora-andrews.com/   Caffeine and Migraine For patients that have migraine, caffeine intake more than 3 days per week can lead to dependency and increased migraine frequency. I would recommend cutting back on your caffeine intake as best you can. The recommended amount of caffeine is 200-300 mg daily, although migraine patients may experience dependency at even lower doses. While you may notice an increase in headache temporarily, cutting back will be helpful for headaches in the long run. For more information on caffeine and migraine, visit: https://americanmigrainefoundation.org/resource-library/caffeine-and-migraine/   Headache Prevention Strategies:   1. Maintain a headache diary; learn to identify and avoid triggers.  - This can be a simple note where you log when you had a headache, associated symptoms, and medications used - There are several smartphone apps developed to help track migraines: Migraine Buddy, Migraine Monitor, Curelator N1-Headache App   Common triggers include: Emotional triggers: Emotional/Upset family or friends Emotional/Upset occupation Business reversal/success Anticipation anxiety Crisis-serious Post-crisis periodNew job/position   Physical triggers: Vacation Day Weekend Strenuous Exercise High Altitude Location New Move Menstrual Day Physical Illness Oversleep/Not enough sleep Weather changes Light: Photophobia or light sesnitivity treatment involves a balance  between desensitization and reduction in overly strong input. Use dark polarized glasses outside, but not inside. Avoid bright or fluorescent light, but do not dim environment to the point that going into a  normally lit room hurts. Consider FL-41 tint lenses, which reduce the most irritating wavelengths without blocking too much light.  These can be obtained at axonoptics.com or theraspecs.com Foods: see list above.   2. Limit use of acute treatments (over-the-counter medications, triptans, etc.) to no more than 2 days per week or 10 days per month to prevent medication overuse headache (rebound headache).     3. Follow a regular schedule (including weekends and holidays): Don't skip meals. Eat a balanced diet. 8 hours of sleep nightly. Minimize stress. Exercise 30 minutes per day. Being overweight is associated with a 5 times increased risk of chronic migraine. Keep well hydrated and drink 6-8 glasses of water per day.   4. Initiate non-pharmacologic measures at the earliest onset of your headache. Rest and quiet environment. Relax and reduce stress. Breathe2Relax is a free app that can instruct you on    some simple relaxtion and breathing techniques. Http://Dawnbuse.com is a    free website that provides teaching videos on relaxation.  Also, there are  many apps that   can be downloaded for "mindful" relaxation.  An app called YOGA NIDRA will help walk you through mindfulness. Another app called Calm can be downloaded to give you a structured mindfulness guide with daily reminders and skill development. Headspace for guided meditation Mindfulness Based Stress Reduction Online Course: www.palousemindfulness.com Cold compresses.   5. Don't wait!! Take the maximum allowable dosage of prescribed medication at the first sign of migraine.   6. Compliance:  Take prescribed medication regularly as directed and at the first sign of a migraine.   7. Communicate:  Call your physician when problems arise, especially if your headaches change, increase in frequency/severity, or become associated with neurological symptoms (weakness, numbness, slurred speech, etc.). Proceed to emergency room if you experience  new or worsening symptoms or symptoms do not resolve, if you have new neurologic symptoms or if headache is severe, or for any concerning symptom.   8. Headache/pain management therapies: Consider various complementary methods, including medication, behavioral therapy, psychological counselling, biofeedback, massage therapy, acupuncture, dry needling, and other modalities.  Such measures may reduce the need for medications. Counseling for pain management, where patients learn to function and ignore/minimize their pain, seems to work very well.   9. Recommend changing family's attention and focus away from patient's headaches. Instead, emphasize daily activities. If first question of day is 'How are your headaches/Do you have a headache today?', then patient will constantly think about headaches, thus making them worse. Goal is to re-direct attention away from headaches, toward daily activities and other distractions.   10. Helpful Websites: www.AmericanHeadacheSociety.org PatentHood.ch www.headaches.org TightMarket.nl www.achenet.org

## 2023-12-16 NOTE — Progress Notes (Signed)
Chief Complaint  Patient presents with   Room 1    Pt is here Alone. Pt states that her migraines are doing better and she may get 1 a month or 1 every Perry month.     HISTORY OF PRESENT ILLNESS:  12/17/23 ALL:  Carla Perry returns for follow up for migraines. She was last seen 12/2022 and doing well on Emgality. Since, she reports migraines remain stable. She no longer has tension pain. She may have 1 migraine every 1-2 months. Tramadol prescribed by PCP for abortive therapy and works well. BP is usually well managed. She continues venlafaxine and verapamil per PCP. Hydrochlorothiazide for BP management. She is followed through oncology and sees PCP regularly. She is caring for 15 yr old husband with dementia and 66yr old mom with dementia. She reports sleep is interrupted at times. She swims three days a week.    12/11/2022 ALL:  Carla Perry is a 67 y.o. female here today for follow up for migraines. She was last seen 08/2021 and doing well on Emgality. Venlafaxine and verapamil last prescribed by me 12/2019. Now filled with PCP. She reports headaches are very well managed. She may have 1-2 headaches per month. She uses Tramadol per PCP for abortive therapy/chronic pain relief.   She has not had any concerns of seizure activity since 2022. She has noted some difficulty with word recall and remembering names. She is able to complete ADLs independently. Drives without difficulty. She manages her meds as well as her husbands. She follows closely with PCP. A1C well managed, last 6.3. BP is usually well controlled. She continues asa and rosuvastatin for stroke prevention.   08/26/2021 AA (Mychart): Emgality is working great for her migraines, I gave her samples, we will leave her a few more samples until we can prescribe it for her, patient has chronic migraines, they are unilateral, pulsating pounding throbbing, photophobia phonophobia, nausea, was having more than 15 migraine days a month, that  has been reduced to only several a month exceptional response. We also discussed MRI of the brain which showed a lacunar infarct remote but unclear how long, we discussed strokes, she states her hemoglobin A1c is controlled, will check her cholesterol panel, she will continue to take aspirin, discussed stroke risks. EEG was negative.   05/22/2021 AA: She hadn't smoked marijuana in 30 years and went to [place] and tried smoking, she took 5 puffs, she lost consciousness, she felt terrible, she has seizure-like activity, she had CPR, she couldn't move to drink water, she doesn't remember it, she had her arms flexed, no urination or tongue biting, was 25 minutes, she stopped breathing, they gave her mouth to mouth and cpr.    12/06/19 ALL (Mychart): Carla Perry is a 68 y.o. female here today for follow up for migraines and left sided paresthesias. She continues venlafaxine 150mg  and verapamil 120mg  daily. She reports increased frequency of migraines over the past year. She has a daily tension type headache. She notes that migraines occur 1-3 times per month.  She had a migraine in November "that was similar to a stroke." She had numbness of the left side of her body. She had left sided headache described as a pressure sensation. She denies weakness, confusion or speech changes. She took two tramadol tablets which helped to ease pain. Symptoms lasted about 5-6 hours and gradually eased off. She has a history of complicated migraines with similar symptoms. She was seen by oncology who sent her to  the ER for evaluation. She had a CT of head that was normal. She continues as a participant in research trial for multiple myeloma. She is currently taking Revlimid daily.   History (copied from [facility] note on 11/03/2018)  Carla Perry is a 68 year old female with a history of migraine headaches and paresthesias on the left side.  She returns today for follow-up.  She states that she has not had a true  headache since October.  When she does get a headache it does occur on the left side of the head she typically can take tramadol and the headache resolved quickly.  She states that she has ongoing numbness on the left side of the body.  This is been present since she saw Carla Perry.  The patient's work-up has been relatively unremarkable.  He denies any changes in her vision, speech denies any weakness.  She returns today for evaluation.   HISTORY( Copied from Dr. Trevor Mace note)  Interval history 06/25/2017: She has constant pressure on the left side of the head, she has 6/10 daily headache, migraines are on the left side, can last days with light and sound sensitivity, nausea, no vomiting, pounding pain severe, no aura, no medication overuse. She takes 2 tramadol at the onset of migraine. Carla Perry had put her on Verapamil and Effexor. She does feel that the Verapamil helps, if she doesn't take it she get a migraine.    Interval history 06/25/2016: She gets 3-4 migraines a month, they last an hour and go away with the tramadol. She still have low back pain and radiation into the legs. She has numbness and weakness in the legs intermittently but MRI of the neural axis was negative. Kneeling thing we didn't do was imaging MRI of the lumbar spine. Considering that she continues to have numbness and weakness in the legs will image MRI of the lumbar spine.   Interval update 03/18/2016: This is a lovely 68 year old female with a very complicated past medical history including hypertension, thyroid disorder, migraine, multiple myeloma s/p chemo currently participating in a research trial, neuropathy, insomnia, bone pain, back pain, joint pain, anemia, fatigue, leukopenia due to antineoplastic chemotherapy, right leg swelling, DVT, diabetes. I have seen patient in the past for chronic migraines but today she is here for a new problem, paresthesias that started February. Happens when waking up in the morning. Never happens  otherwise. She feels an electrical current through her body with persistent tingling.  Starts in the thoracic or in the lumbar spine. Feels like electricity. Starts in the spine. She feels tingling all over, in the face, arms, legs from head to toe.  Will last several hours. A lot of times it is associated with a migraine. Nothing makes it better. Stretching aggravates the symptoms. She has pain in the spine associated with the paresthesias. Last night it started with numbness at 2am. She couldn't get back to sleep. She developed a headache. He still has residual headache and Her left eye still fells sore and head feels sore. Also a residual burning feeling. Arms feel like they are burning. Episodes becoming more frequent. 6 episodes in Feb, 8 in march, 13 so far in April. Becoming more frequent and lasting longer. She experiences facial numbness with the episodes, burning and tingling in the spine that spreads to her whole body. The symptoms are usually associated with headache or migraine. Moving around helps with the symptoms. No weakness. No vision changes but does describe eye pain. The headaches  are pressure and pain.she still gets the vertigo with the migraines. Headache left side of the head with the vertigo, no light or sound sensitivity. 5-10 minutes after the paresthesias she has the migraines/headaches. No inciting events. No trauma. She has low back pain, situated on the right side, radiates down the legs to the back of the legs, walking is not as good. Right leg goes numb. Paresthesias are more on the left.    Recent labs include normal B12. Normal TSH.   Interval update 06/19/2015: This is a former patient of Carla Perry who is seen in our office by our nurse practitioner Carla Perry. Patient is establishing care with me today. She is a 68 year old female with a history of chronic migraines. She was last seen in April. Today she returns and she is doing very well. She is on verapamil 120 mg  extended release and venlafaxine XR 75 mg. She is doing very well. She has had maybe 3 migraines within the last few months. And they have been on the left side of the head with vertigo twice. When she has the vertigo, she has to lay in bed and sleep it off. But this is a significant improvement and she is very happy with this maintenance. She endorses nausea, photophobia and phonophobia. She takes tramadol for acute management. Discussed in detail that if she is doing well, we will keep her on her current migraine management. She can continue to follow up with Carla Perry as needed.   REVIEW OF SYSTEMS: Out of a complete 14 system review of symptoms, the patient complains only of the following symptoms, headaches, memory loss and all Perry reviewed systems are negative.   ALLERGIES: Allergies  Allergen Reactions   Metformin Diarrhea     HOME MEDICATIONS: Outpatient Medications Prior to Visit  Medication Sig Dispense Refill   aspirin 325 MG tablet Take 325 mg by mouth daily.      Cholecalciferol (VITAMIN D3) 2000 UNITS TABS Take 2,000 Units by mouth daily.     dorzolamide-timolol (COSOPT) 22.3-6.8 MG/ML ophthalmic solution Place 1 drop into both eyes 2 (two) times daily.     hydrochlorothiazide (HYDRODIURIL) 25 MG tablet Take 25 mg by mouth every evening.      lenalidomide (REVLIMID) 5 MG capsule TAKE 1 CAPSULE BY MOUTH EVERY  Perry DAY FOR 21 DAYS ON THEN 7  DAYS OFF 11 capsule 0   levothyroxine (SYNTHROID, LEVOTHROID) 75 MCG tablet Take 75 mcg by mouth daily before breakfast.     methocarbamol (ROBAXIN) 500 MG tablet Take 1 tablet (500 mg total) by mouth every 8 (eight) hours as needed for muscle spasms. 90 tablet 1   Multiple Vitamins-Minerals (CENTRUM SILVER PO) Take 1 tablet by mouth daily.      polyethylene glycol (MIRALAX / GLYCOLAX) packet Take 17 g by mouth daily as needed for mild constipation.      RESTASIS 0.05 % ophthalmic emulsion PLACE 1 DROP IN BOTH EYES IN THE EVENING  4    rosuvastatin (CRESTOR) 5 MG tablet Take 5 mg by mouth daily at 2 PM.     sitaGLIPtin (JANUVIA) 50 MG tablet Take 50 mg by mouth daily.     traMADol (ULTRAM) 50 MG tablet Take by mouth every 6 (six) hours as needed.     traZODone (DESYREL) 50 MG tablet TAKE 1 TABLET (50 MG TOTAL) BY MOUTH AT BEDTIME. 90 tablet 3   venlafaxine XR (EFFEXOR-XR) 150 MG 24 hr capsule Take 1 capsule (150 mg total)  by mouth daily with breakfast. 90 capsule 3   verapamil (VERELAN PM) 120 MG 24 hr capsule Take 1 capsule (120 mg total) by mouth daily. 90 capsule 3   Galcanezumab-gnlm (EMGALITY) 120 MG/ML SOAJ INJECT 120MG  (1 PEN) UNDER THE SKIN EVERY 4 WEEKS. 3 mL 2   Facility-Administered Medications Prior to Visit  Medication Dose Route Frequency Provider Last Rate Last Admin   gadopentetate dimeglumine (MAGNEVIST) injection 15 mL  15 mL Intravenous Once PRN Carla Fret, MD         PAST MEDICAL HISTORY: Past Medical History:  Diagnosis Date   Abnormal thyroid function test 08/30/2014   Anemia    Anemia, unspecified 10/06/2013   Bone pain 10/21/2013   Bronchitis 01/31/2015   Diabetes mellitus without complication (HCC) 08/2014   Steroid induced diabetes. has not picked oral med up from pharmacy as of 09-14-14   Diverticulitis 07/18/2014   DVT (deep venous thrombosis) (HCC) 02/07/2014   HBP (high blood pressure)    Insomnia 02/11/2016   Leukopenia due to antineoplastic chemotherapy (HCC) 12/27/2013   Memory loss    MGUS (monoclonal gammopathy of unknown significance)    MGUS (monoclonal gammopathy of unknown significance) 10/06/2013   Migraine    Multiple myeloma, without mention of having achieved remission 11/16/2013   Pancytopenia (HCC) 12/27/2015   Peripheral neuropathy 12/27/2015   Personal history of chemotherapy    Right leg swelling 02/07/2014   Seizure (HCC) 1960   single seizure episode at age 41   Thyroid disorder    Vitamin D deficiency 10/24/2014     PAST SURGICAL HISTORY: Past Surgical  History:  Procedure Laterality Date   HEMORRHOID SURGERY     PORT-A-CATH REMOVAL  10-2014   PORTACATH PLACEMENT Right jan 2015     FAMILY HISTORY: Family History  Problem Relation Age of Onset   Throat cancer Father    Stroke Father    Cancer Brother        prostate   Breast cancer Neg Hx      SOCIAL HISTORY: Social History   Socioeconomic History   Marital status: Married    Spouse name: Merton Border   Number of children: 2   Years of education: 14   Highest education level: Not on file  Occupational History    Employer: [employer]  Tobacco Use   Smoking status: Never   Smokeless tobacco: Never  Vaping Use   Vaping status: Never Used  Substance and Sexual Activity   Alcohol use: No    Alcohol/week: 0.0 standard drinks of alcohol   Drug use: No   Sexual activity: Not on file  Perry Topics Concern   Not on file  Social History Narrative   Patient lives at home with her husband Systems developer). Patient has two years college.   Right handed.   Caffeine- None   Social Drivers of Corporate investment banker Strain: Not on file  Food Insecurity: Not on file  Transportation Needs: Not on file  Physical Activity: Not on file  Stress: Not on file  Social Connections: Not on file  Intimate Partner Violence: Not on file     PHYSICAL EXAM  Vitals:   12/17/23 0926  BP: (!) 146/88  Pulse: (!) 53  Weight: 162 lb 8 oz (73.7 kg)  Height: 5\' 7"  (1.702 m)    Body mass index is 25.45 kg/m.  Generalized: Well developed, in no acute distress  Cardiology: normal rate and rhythm, no murmur auscultated  Respiratory: clear  to auscultation bilaterally    Neurological examination  Mentation: Alert oriented to time, place, history taking. Follows all commands speech and language fluent Cranial nerve II-XII: Pupils were equal round reactive to light. Extraocular movements were full, visual field were full on confrontational test. Facial sensation and strength were normal. Head  turning and shoulder shrug  were normal and symmetric. Motor: The motor testing reveals 5 over 5 strength of all 4 extremities. Good symmetric motor tone is noted throughout.  Gait and station: Gait is normal.    DIAGNOSTIC DATA (LABS, IMAGING, TESTING) - I reviewed patient records, labs, notes, testing and imaging myself where available.  Lab Results  Component Value Date   WBC 3.3 (L) 12/15/2023   HGB 12.3 12/15/2023   HCT 39.5 12/15/2023   MCV 72.5 (L) 12/15/2023   PLT 215 12/15/2023      Component Value Date/Time   NA 141 12/15/2023 0842   NA 141 09/29/2017 0830   K 3.8 12/15/2023 0842   K 3.6 09/29/2017 0830   CL 105 12/15/2023 0842   CO2 29 12/15/2023 0842   CO2 29 09/29/2017 0830   GLUCOSE 97 12/15/2023 0842   GLUCOSE 87 09/29/2017 0830   BUN 13 12/15/2023 0842   BUN 14.0 09/29/2017 0830   CREATININE 0.90 12/15/2023 0842   CREATININE 0.9 09/29/2017 0830   CALCIUM 9.9 12/15/2023 0842   CALCIUM 9.6 09/29/2017 0830   PROT 7.9 12/15/2023 0842   PROT 7.3 09/29/2017 0832   PROT 8.0 09/29/2017 0830   ALBUMIN 4.2 12/15/2023 0842   ALBUMIN 3.8 09/29/2017 0830   AST 27 12/15/2023 0842   AST 21 09/29/2017 0830   ALT 30 12/15/2023 0842   ALT 69 (H) 09/29/2017 0830   ALKPHOS 57 12/15/2023 0842   ALKPHOS 60 09/29/2017 0830   BILITOT 0.8 12/15/2023 0842   BILITOT 0.46 09/29/2017 0830   GFRNONAA >60 12/15/2023 0842   GFRAA >60 07/31/2020 0737   Lab Results  Component Value Date   CHOL 112 08/28/2021   HDL 56 08/28/2021   LDLCALC 36 08/28/2021   TRIG 109 08/28/2021   CHOLHDL 2.0 08/28/2021   No results found for: "HGBA1C" Lab Results  Component Value Date   VITAMINB12 642 04/27/2018   Lab Results  Component Value Date   TSH 1.102 12/15/2023        No data to display               No data to display           ASSESSMENT AND PLAN  68 y.o. year old female  has a past medical history of Abnormal thyroid function test (08/30/2014), Anemia, Anemia,  unspecified (10/06/2013), Bone pain (10/21/2013), Bronchitis (01/31/2015), Diabetes mellitus without complication (HCC) (08/2014), Diverticulitis (07/18/2014), DVT (deep venous thrombosis) (HCC) (02/07/2014), HBP (high blood pressure), Insomnia (02/11/2016), Leukopenia due to antineoplastic chemotherapy (HCC) (12/27/2013), Memory loss, MGUS (monoclonal gammopathy of unknown significance), MGUS (monoclonal gammopathy of unknown significance) (10/06/2013), Migraine, Multiple myeloma, without mention of having achieved remission (11/16/2013), Pancytopenia (HCC) (12/27/2015), Peripheral neuropathy (12/27/2015), Personal history of chemotherapy, Right leg swelling (02/07/2014), Seizure (HCC) (1960), Thyroid disorder, and Vitamin D deficiency (10/24/2014). here with    Chronic migraine without aura without status migrainosus, not intractable - Plan: Galcanezumab-gnlm (EMGALITY) 120 MG/ML SOAJ  Carla Perry is doing well. Headaches are well managed on Emgality. She will continue injections every 30 days. She will continue tramadol, venlafaxine and verapamil per PCP.  No recent seizure activity. Continue close  follow up with Dr Donette Larry for stroke prevention and co morbidity management. Healthy lifestyle habits encouraged. She will follow up with PCP as directed. She will return to see me in 1 year, sooner if needed. She verbalizes understanding and agreement with this plan.   No orders of the defined types were placed in this encounter.    Meds ordered this encounter  Medications   Galcanezumab-gnlm (EMGALITY) 120 MG/ML SOAJ    Sig: INJECT 120MG  (1 PEN) UNDER THE SKIN EVERY 4 WEEKS.    Dispense:  3 mL    Refill:  3    Supervising Provider:   Anson Perry [[id number]]    Lot Number?:   [id number]    Expiration Date?:   04/04/2023    Quantity:   2             1 box has 2 pens     Shawnie Dapper, MSN, FNP-C 12/17/2023, 9:48 AM  [facility] [address] [place], [place] [address] [phone]

## 2023-12-17 ENCOUNTER — Encounter: Payer: Self-pay | Admitting: Family Medicine

## 2023-12-17 ENCOUNTER — Ambulatory Visit: Payer: Medicare Other | Admitting: Family Medicine

## 2023-12-17 VITALS — BP 146/88 | HR 53 | Ht 67.0 in | Wt 162.5 lb

## 2023-12-17 DIAGNOSIS — G43709 Chronic migraine without aura, not intractable, without status migrainosus: Secondary | ICD-10-CM

## 2023-12-17 MED ORDER — EMGALITY 120 MG/ML ~~LOC~~ SOAJ: SUBCUTANEOUS | 3 refills | Status: AC

## 2023-12-22 ENCOUNTER — Inpatient Hospital Stay: Payer: Medicare Other | Admitting: Hematology and Oncology

## 2023-12-22 ENCOUNTER — Encounter: Payer: Self-pay | Admitting: *Deleted

## 2023-12-22 VITALS — BP 141/83 | HR 62 | Temp 97.6°F | Resp 18 | Ht 67.0 in | Wt 165.6 lb

## 2023-12-22 DIAGNOSIS — C9001 Multiple myeloma in remission: Secondary | ICD-10-CM | POA: Diagnosis not present

## 2023-12-22 DIAGNOSIS — D61811 Other drug-induced pancytopenia: Secondary | ICD-10-CM | POA: Diagnosis not present

## 2023-12-22 DIAGNOSIS — Z006 Encounter for examination for normal comparison and control in clinical research program: Secondary | ICD-10-CM

## 2023-12-22 LAB — MULTIPLE MYELOMA PANEL, SERUM
Albumin SerPl Elph-Mcnc: 4 g/dL (ref 2.9–4.4)
Albumin/Glob SerPl: 1.2 (ref 0.7–1.7)
Alpha 1: 0.2 g/dL (ref 0.0–0.4)
Alpha2 Glob SerPl Elph-Mcnc: 0.6 g/dL (ref 0.4–1.0)
B-Globulin SerPl Elph-Mcnc: 1.1 g/dL (ref 0.7–1.3)
Gamma Glob SerPl Elph-Mcnc: 1.8 g/dL (ref 0.4–1.8)
Globulin, Total: 3.6 g/dL (ref 2.2–3.9)
IgA: 128 mg/dL (ref 87–352)
IgG (Immunoglobin G), Serum: 1973 mg/dL — ABNORMAL HIGH (ref 586–1602)
IgM (Immunoglobulin M), Srm: 61 mg/dL (ref 26–217)
Total Protein ELP: 7.6 g/dL (ref 6.0–8.5)

## 2023-12-22 NOTE — Research (Signed)
Research - [facility] ECOG-ACRIN [id number] Maintenance Cycle 115 Day 1  Patient into clinic unaccompanied today for evaluation prior to beginning treatment Cycle 115 of revlimid.     Labs w/myeloma panel: Blood drawn on 12/15/2023 within 7-day protocol window to allow adequate time for results prior to treatment. Unfortunately, the [facility] panel results are pending. Spoke with customer rep at [facility] yesterday who states the IFE is delayed and not expected to have results before 01/01/24.  Dr. Bertis Ruddy is aware and states this delay does not affect the safety of the patient.  She reviewed all other available lab results and found them to be within parameters for continued dosing at Maintenance phase Dose Level -3, Revlimid 5 mg every other day for 21 days, every 28 days.  Per Dr. Bertis Ruddy, bone marrow biopsy and aspirate would not be considered standard of care at this time. In addition, Dr. Bertis Ruddy states that 24 hr urine is not required to assess disease response.     Adverse Events:  Patient reported the following since last visit; Ongoing fatigue (grade 2) unchanged, not limiting self-care. 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 She denies insomnia diarrhea, nausea, vomiting, shortness of breath, swelling, fever, irritability, motor peripheral neuropathy, or rash in the past month.  Other reported AEs include ongoing daily moderate back ache (grade 2) which she takes tramadol and methocarbamol prn.    Patient continues to exercise swimming laps at the pool average of 3 days per week. See AE table below.     Maintenance Cycle 112-114 09/22/23-12/22/23 (end of Cycle 114 = 12/21/23) Solicited &/or Reportable Events Grade Attribution to lenalidomide Cycle # Comments  Anemia Grade 1 Definite  112, 113    Hyperglycemia Grade 0 -      Lymphocyte count decreased Grade 0 -      Neutrophil count decreased Grade 2 Definite 112, 113, 114    Neutrophil cunt decreased Grade 3 Defintie       112    Platelet count decreased Grade 0 -      Diarrhea Grade 2 Possible      112 Baseline is 1 stool/ 24 hrs  Dyspnea Grade 0 -      Edema: limbs Grade 0 -      Fatigue  Grade 2  Probable 112, 113, 114 Moderate, occasionally limiting ADLs (unchanged from previous)  Fever Grade 0 -      Insomnia  Grade 2 Unlikely 112    Irritability Grade 0 -      Muscle weakness trunk Grade 0 -      Nausea Grade 0 -       Peripheral motor neuropathy Grade 0 -      Peripheral sensory neuropathy  Grade 2 Unlikely 112, 113, 114 Moderate symptoms, unchanged from previous  Rash acneiform Grade 0  -      Vomiting Grade 0 -        Non-reportable AEs (unsolicited, < Grade 3): Moderate back pain (grade 2) Decreased WBC (grade 1)  Headache left sided (grade 2)   History and physical exam: See MD note dated today.  Maintenance therapy with Revlimid:  Patient returned completed cycle 114 Medication Calendar, confirming dosing at dose level -3, Revlimid 5mg  every other day, Days 1-21. Patient notes no missed doses of Revlimid this cycle.  Patient given printed appointment calendar with Cycle 115 treatment days marked, to document doses for the next treatment cycle to begin today.   Plan: Patient understands  to start next cycle of Revlimid today.  Next lab appointment will be scheduled for 01/19/24 to assess AEs and CBC prior to the next cycle of Revlimid. Patient states she will check MyChart portal for myeloma panel results. Informed patient that research nurse will call her if there are any unexpected results, otherwise she can call research nurse if any questions prior to next visit. She verbalized understanding.   Domenica Reamer, BSN, RN, [employer] Research Nurse II [phone] 12/22/2023 2:48 PM

## 2023-12-23 ENCOUNTER — Encounter: Payer: Self-pay | Admitting: Hematology and Oncology

## 2023-12-23 NOTE — Assessment & Plan Note (Signed)
This is due to her treatment She is not symptomatic Observe 

## 2023-12-23 NOTE — Progress Notes (Signed)
[facility] OFFICE PROGRESS NOTE  Patient Care Team: Georgann Housekeeper, MD as PCP - General (Internal Medicine) Artis Delay, MD as Consulting Physician (Hematology and Oncology)  ASSESSMENT & PLAN:  Multiple myeloma in remission [facility]) Her recent myeloma panel is pending Her slightly elevated free light chains and elevated IgG are unrelated The patient is comfortable to remain on Revlimid indefinitely. She will continue treatment per research protocol She will continue calcium with vitamin D supplement and aspirin for DVT prophylaxis She is not on Zometa due to history of osteonecrosis of the jaw   Drug-induced pancytopenia (HCC) This is due to her treatment She is not symptomatic Observe  No orders of the defined types were placed in this encounter.   All questions were answered. The patient knows to call the clinic with any problems, questions or concerns. The total time spent in the appointment was 20 minutes encounter with patients including review of chart and various tests results, discussions about plan of care and coordination of care plan   Artis Delay, MD 12/23/2023 10:41 AM  INTERVAL HISTORY: Please see below for problem oriented charting. she returns for chemo follow-up on long-term treatment with Revlimid for multiple myeloma She is doing well She denies new side effects from treatment No recent infection No new bone pain  REVIEW OF SYSTEMS:   Constitutional: Denies fevers, chills or abnormal weight loss Eyes: Denies blurriness of vision Ears, nose, mouth, throat, and face: Denies mucositis or sore throat Respiratory: Denies cough, dyspnea or wheezes Cardiovascular: Denies palpitation, chest discomfort or lower extremity swelling Gastrointestinal:  Denies nausea, heartburn or change in bowel habits Skin: Denies abnormal skin rashes Lymphatics: Denies new lymphadenopathy or easy bruising Neurological:Denies numbness, tingling or new  weaknesses Behavioral/Psych: Mood is stable, no new changes  All other systems were reviewed with the patient and are negative.  I have reviewed the past medical history, past surgical history, social history and family history with the patient and they are unchanged from previous note.  ALLERGIES:  is allergic to metformin.  MEDICATIONS:  Current Outpatient Medications  Medication Sig Dispense Refill   aspirin 325 MG tablet Take 325 mg by mouth daily.      Cholecalciferol (VITAMIN D3) 2000 UNITS TABS Take 2,000 Units by mouth daily.     dorzolamide-timolol (COSOPT) 22.3-6.8 MG/ML ophthalmic solution Place 1 drop into both eyes 2 (two) times daily.     Galcanezumab-gnlm (EMGALITY) 120 MG/ML SOAJ INJECT 120MG  (1 PEN) UNDER THE SKIN EVERY 4 WEEKS. 3 mL 3   hydrochlorothiazide (HYDRODIURIL) 25 MG tablet Take 25 mg by mouth every evening.      lenalidomide (REVLIMID) 5 MG capsule TAKE 1 CAPSULE BY MOUTH EVERY  OTHER DAY FOR 21 DAYS ON THEN 7  DAYS OFF 11 capsule 0   levothyroxine (SYNTHROID, LEVOTHROID) 75 MCG tablet Take 75 mcg by mouth daily before breakfast.     methocarbamol (ROBAXIN) 500 MG tablet Take 1 tablet (500 mg total) by mouth every 8 (eight) hours as needed for muscle spasms. 90 tablet 1   Multiple Vitamins-Minerals (CENTRUM SILVER PO) Take 1 tablet by mouth daily.      polyethylene glycol (MIRALAX / GLYCOLAX) packet Take 17 g by mouth daily as needed for mild constipation.      RESTASIS 0.05 % ophthalmic emulsion PLACE 1 DROP IN BOTH EYES IN THE EVENING  4   rosuvastatin (CRESTOR) 5 MG tablet Take 5 mg by mouth daily at 2 PM.     sitaGLIPtin (  JANUVIA) 50 MG tablet Take 50 mg by mouth daily.     traMADol (ULTRAM) 50 MG tablet Take by mouth every 6 (six) hours as needed.     traZODone (DESYREL) 50 MG tablet TAKE 1 TABLET (50 MG TOTAL) BY MOUTH AT BEDTIME. 90 tablet 3   venlafaxine XR (EFFEXOR-XR) 150 MG 24 hr capsule Take 1 capsule (150 mg total) by mouth daily with breakfast. 90  capsule 3   verapamil (VERELAN PM) 120 MG 24 hr capsule Take 1 capsule (120 mg total) by mouth daily. 90 capsule 3   No current facility-administered medications for this visit.   Facility-Administered Medications Ordered in Other Visits  Medication Dose Route Frequency Provider Last Rate Last Admin   gadopentetate dimeglumine (MAGNEVIST) injection 15 mL  15 mL Intravenous Once PRN Anson Fret, MD        SUMMARY OF ONCOLOGIC HISTORY: Oncology History Overview Note  ISS stage 1 IgG lambda subtype (serum albumin 3.6, Beta2 microglobulin 2.32) Durie Salmon Stage 1   Multiple myeloma in remission (HCC)  10/10/2013 Imaging   Skeletal survery was negative   11/09/2013 Bone Marrow Biopsy   BM biopsy confirmed myeloma, 76% involved, IgG lambda subtype   12/06/2013 - 08/29/2014 Chemotherapy   Sh received chemo with revlimid, Velcade, Dexamethasone and Zometa. Patient particpated in clinical research CTSU [id number]   02/23/2014 Bone Marrow Biopsy   Repeat bone marrow biopsy showed 5% involvement   03/31/2014 Adverse Reaction   Zometa was discontinued due to osteonecrosis of the jaw.   05/05/2014 Imaging   Imaging study of the neck showed no explanation that could cause right neck pain. She is noted to have incidental left upper lung nodule. Plan to repeat imaging study in 3 months.   09/06/2014 Imaging   Bone survey showed no evidence of fracture   09/14/2014 Bone Marrow Biopsy   Bone marrow biopsy showed 8% residual plasma cells by manual count but none on the biopsy specimen   09/26/2014 -  Chemotherapy   She is started on cycle 1 of maintenance Revlimid   01/31/2015 Imaging    chest x-ray showed pneumonia. Treatment was placed on hold.   05/03/2015 Bone Marrow Biopsy   Accession: [id number] repeat bone marrow aspirate and biopsy show 5% residual plasma cells   10/14/2016 Bone Marrow Biopsy   Bone marrow biopsy showed the plasma cells represent 4% of all cells with lack of large  aggregates or sheets. To assess the plasma cell clonality, immunohistochemical stains is performed and it lack clonality. Normal cytogenetics and FISH   01/09/2017 Imaging   CT chest showed ground-glass 1.5 cm apical left upper lobe pulmonary nodule. Initial follow-up with CT at 6-12 months is recommended to confirm persistence. If persistent, repeat CT is recommended every 2 years until 5 years of stability has been established. This recommendation follows the consensus statement: Guidelines for Management of Incidental Pulmonary Nodules Detected on CT Images: From the Fleischner Society 2017; Radiology 2017; 284:228-243. 2. Mild patchy ground-glass opacities in the right upper lobe, probably inflammatory, which can also be reassessed on follow-up chest CT performed for the above dominant ground-glass nodule. 3. Solitary 3 mm right lower lobe solid pulmonary nodule, which can also be reassessed on follow-up chest CT performed for the above dominant ground-glass nodule. 4. No thoracic adenopathy. 5. Aortic atherosclerosis.  Two-vessel coronary atherosclerosis.   06/30/2017 Imaging   1. No interval change in the 11 x 13 mm ground-glass nodule left upper lobe. Given the nearly 6  months of imaging stability, repeat CT is recommended every 2 years until 5 years of stability has been established. This recommendation follows the consensus statement: Guidelines for Management of Incidental Pulmonary Nodules Detected on CT Images: From the Fleischner Society 2017; Radiology 2017; 284:228-243. 2. Interval resolution of the tiny patchy ground-glass nodules right upper lobe, likely infectious/inflammatory etiology. 3. Stable 3 mm right lower lobe pulmonary nodule. Attention on follow-up recommended. 4.  Aortic Atherosclerois (ICD10-170.0)   09/29/2017 Imaging   Skeletal survey 1. Questionable new tiny lucency noted the posterior portion of C3 vertebral body. This may represent a small lytic lesion.  2. No  definite thoracic lesion noted on today's exam. Stable lucencies in the left ilium and acetabulum.  3. No other focal abnormalities identified. The left hip is unremarkable .   04/16/2021 - 08/05/2022 Chemotherapy   Patient is on Treatment Plan : MYELOMA Research CTSU [id number] Arm D Maintenance Lenalidomide q28d     06/17/2022 Imaging   There are no focal lytic lesions. Other findings as described in the body of the report. Overall, no significant interval changes are noted since 07/31/2020.       PHYSICAL EXAMINATION: ECOG PERFORMANCE STATUS: 0 - Asymptomatic  Vitals:   12/22/23 0852  BP: (!) 141/83  Pulse: 62  Resp: 18  Temp: 97.6 F (36.4 C)  SpO2: 100%   Filed Weights   12/22/23 0852  Weight: 165 lb 9.6 oz (75.1 kg)    GENERAL:alert, no distress and comfortable SKIN: skin color, texture, turgor are normal, no rashes or significant lesions EYES: normal, Conjunctiva are pink and non-injected, sclera clear OROPHARYNX:no exudate, no erythema and lips, buccal mucosa, and tongue normal  NECK: supple, thyroid normal size, non-tender, without nodularity LYMPH:  no palpable lymphadenopathy in the cervical, axillary or inguinal LUNGS: clear to auscultation and percussion with normal breathing effort HEART: regular rate & rhythm and no murmurs and no lower extremity edema ABDOMEN:abdomen soft, non-tender and normal bowel sounds Musculoskeletal:no cyanosis of digits and no clubbing  NEURO: alert & oriented x 3 with fluent speech, no focal motor/sensory deficits  LABORATORY DATA:  I have reviewed the data as listed    Component Value Date/Time   NA 141 12/15/2023 0842   NA 141 09/29/2017 0830   K 3.8 12/15/2023 0842   K 3.6 09/29/2017 0830   CL 105 12/15/2023 0842   CO2 29 12/15/2023 0842   CO2 29 09/29/2017 0830   GLUCOSE 97 12/15/2023 0842   GLUCOSE 87 09/29/2017 0830   BUN 13 12/15/2023 0842   BUN 14.0 09/29/2017 0830   CREATININE 0.90 12/15/2023 0842   CREATININE 0.9  09/29/2017 0830   CALCIUM 9.9 12/15/2023 0842   CALCIUM 9.6 09/29/2017 0830   PROT 7.9 12/15/2023 0842   PROT 7.3 09/29/2017 0832   PROT 8.0 09/29/2017 0830   ALBUMIN 4.2 12/15/2023 0842   ALBUMIN 3.8 09/29/2017 0830   AST 27 12/15/2023 0842   AST 21 09/29/2017 0830   ALT 30 12/15/2023 0842   ALT 69 (H) 09/29/2017 0830   ALKPHOS 57 12/15/2023 0842   ALKPHOS 60 09/29/2017 0830   BILITOT 0.8 12/15/2023 0842   BILITOT 0.46 09/29/2017 0830   GFRNONAA >60 12/15/2023 0842   GFRAA >60 07/31/2020 0737    No results found for: "SPEP", "UPEP"  Lab Results  Component Value Date   WBC 3.3 (L) 12/15/2023   NEUTROABS 1.0 (L) 12/15/2023   HGB 12.3 12/15/2023   HCT 39.5 12/15/2023   MCV 72.5 (  L) 12/15/2023   PLT 215 12/15/2023      Chemistry      Component Value Date/Time   NA 141 12/15/2023 0842   NA 141 09/29/2017 0830   K 3.8 12/15/2023 0842   K 3.6 09/29/2017 0830   CL 105 12/15/2023 0842   CO2 29 12/15/2023 0842   CO2 29 09/29/2017 0830   BUN 13 12/15/2023 0842   BUN 14.0 09/29/2017 0830   CREATININE 0.90 12/15/2023 0842   CREATININE 0.9 09/29/2017 0830      Component Value Date/Time   CALCIUM 9.9 12/15/2023 0842   CALCIUM 9.6 09/29/2017 0830   ALKPHOS 57 12/15/2023 0842   ALKPHOS 60 09/29/2017 0830   AST 27 12/15/2023 0842   AST 21 09/29/2017 0830   ALT 30 12/15/2023 0842   ALT 69 (H) 09/29/2017 0830   BILITOT 0.8 12/15/2023 0842   BILITOT 0.46 09/29/2017 0830

## 2023-12-23 NOTE — Assessment & Plan Note (Signed)
Her recent myeloma panel is pending Her slightly elevated free light chains and elevated IgG are unrelated The patient is comfortable to remain on Revlimid indefinitely. 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

## 2023-12-24 ENCOUNTER — Other Ambulatory Visit (HOSPITAL_COMMUNITY): Payer: Self-pay

## 2023-12-25 ENCOUNTER — Other Ambulatory Visit (HOSPITAL_COMMUNITY): Payer: Self-pay

## 2023-12-25 ENCOUNTER — Telehealth: Payer: Self-pay | Admitting: Pharmacy Technician

## 2023-12-25 NOTE — Telephone Encounter (Signed)
Oral Oncology Patient Advocate Encounter  Reviewed non-formulary letter from the patient's insurance through [facility] Medicare. After investigation it appears patient has been receiving brand name Revlimid and her plan now wants her to fill generic lenalidomide. If patient has a clinical reason to stay on branded medication a PA will be completed to request a formulary override.  Jinger Neighbors, CPhT-Adv Oncology Pharmacy Patient Advocate [facility] Direct Number: [phone]  Fax: [phone]

## 2024-01-04 ENCOUNTER — Other Ambulatory Visit (HOSPITAL_COMMUNITY): Payer: Self-pay

## 2024-01-09 ENCOUNTER — Other Ambulatory Visit: Payer: Self-pay | Admitting: Hematology and Oncology

## 2024-01-09 DIAGNOSIS — C9001 Multiple myeloma in remission: Secondary | ICD-10-CM

## 2024-01-11 ENCOUNTER — Other Ambulatory Visit: Payer: Self-pay

## 2024-01-11 MED ORDER — LENALIDOMIDE 5 MG PO CAPS: 5.0000 mg | ORAL_CAPSULE | ORAL | 0 refills | Status: DC

## 2024-01-11 NOTE — Telephone Encounter (Signed)
Pls refill 

## 2024-01-14 ENCOUNTER — Telehealth: Payer: Self-pay | Admitting: Pharmacy Technician

## 2024-01-14 ENCOUNTER — Other Ambulatory Visit (HOSPITAL_COMMUNITY): Payer: Self-pay

## 2024-01-14 NOTE — Telephone Encounter (Signed)
Oral Oncology Patient Advocate Encounter  Prior Authorization for RELVIMID has been approved.    PA# [id number] Effective dates: 01/14/24 through 11/30/24  Patient must continue to fill at [facility].    Jinger Neighbors, CPhT-Adv Oncology Pharmacy Patient Advocate [facility] Direct Number: [phone]  Fax: [phone]

## 2024-01-14 NOTE — Telephone Encounter (Signed)
Oral Oncology Patient Advocate Encounter   Received notification that prior authorization for Revlimid is required due to a shortage of the generic, lenalidomide/   PA submitted on 01/14/24 Key [id number]  Status is pending     Jinger Neighbors, CPhT-Adv Oncology Pharmacy Patient Advocate [facility] Direct Number: [phone]  Fax: [phone]

## 2024-01-19 ENCOUNTER — Inpatient Hospital Stay: Payer: Medicare Other | Attending: Hematology and Oncology

## 2024-01-19 ENCOUNTER — Encounter: Payer: Self-pay | Admitting: *Deleted

## 2024-01-19 DIAGNOSIS — Z006 Encounter for examination for normal comparison and control in clinical research program: Secondary | ICD-10-CM | POA: Insufficient documentation

## 2024-01-19 DIAGNOSIS — C9001 Multiple myeloma in remission: Secondary | ICD-10-CM

## 2024-01-19 LAB — CBC WITH DIFFERENTIAL (CANCER CENTER ONLY)
Abs Immature Granulocytes: 0 10*3/uL (ref 0.00–0.07)
Basophils Absolute: 0 10*3/uL (ref 0.0–0.1)
Basophils Relative: 1 %
Eosinophils Absolute: 0 10*3/uL (ref 0.0–0.5)
Eosinophils Relative: 1 %
HCT: 37.4 % (ref 36.0–46.0)
Hemoglobin: 11.6 g/dL — ABNORMAL LOW (ref 12.0–15.0)
Immature Granulocytes: 0 %
Lymphocytes Relative: 52 %
Lymphs Abs: 1.9 10*3/uL (ref 0.7–4.0)
MCH: 22.7 pg — ABNORMAL LOW (ref 26.0–34.0)
MCHC: 31 g/dL (ref 30.0–36.0)
MCV: 73.2 fL — ABNORMAL LOW (ref 80.0–100.0)
Monocytes Absolute: 0.5 10*3/uL (ref 0.1–1.0)
Monocytes Relative: 13 %
Neutro Abs: 1.2 10*3/uL — ABNORMAL LOW (ref 1.7–7.7)
Neutrophils Relative %: 33 %
Platelet Count: 200 10*3/uL (ref 150–400)
RBC: 5.11 MIL/uL (ref 3.87–5.11)
RDW: 15 % (ref 11.5–15.5)
WBC Count: 3.6 10*3/uL — ABNORMAL LOW (ref 4.0–10.5)
nRBC: 0 % (ref 0.0–0.2)

## 2024-01-19 NOTE — Research (Signed)
Research - CTSU ECOG-ACRIN [id number] Maintenance Cycle 116, Day 1  Patient into clinic unaccompanied today for CBC and toxicity assessment prior to beginning treatment Cycle 116 of revlimid.     Lab: CBC drawn per protocol.    Adverse Events:  Patient reported the following since last visit; Ongoing fatigue (grade 2) unchanged, not limiting self-care. 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  Insomnia x 1 episode (grade 1), which she did not need to take any medication. Nausea (grade 1) associated with migraine, resolved when migraine pain resolved and did not interfere with drinking or eating.  She denies diarrhea, vomiting, shortness of breath, swelling, fever, irritability, motor peripheral neuropathy, or rash since last visit.  Other reported AEs include ongoing daily moderate back ache (grade 2) which she takes tramadol and methocarbamol prn.  Patient also reports one day with left knee pain (grade 1) described as sharp and shooting while shopping at  [employer].  The pain in her knee last around 30 minutes and "moved up" to her left side of head and left arm. She was able to continue shopping and drive home on her own.  She continued to have left sided head ache (grade 2) and left arm ache (grade 2) when she got home so she reports taking an Aspirin 325 mg and laying down which helped. The arm pain resolved but the head ache continued intermittently for 2 more days and on a few other occasions during the past month for which she did take tramadol prn.  Patient states she did not feel a need to go to the ED as she has experienced these symptoms previously and thinks it is related to her migraines.  She will report to her neurologist at next visit.  Patient continues to exercise swimming laps at the pool average of 3 days per week. See AE table below.     Maintenance Cycle 115 12/22/23- 01/19/24 (end of Cycle 115 = 01/18/24) Solicited &/or Reportable Events Grade Attribution  to lenalidomide Cycle # Comments  Anemia Grade 1 Definite  115    Hyperglycemia Grade 0 -      Lymphocyte count decreased Grade 0 -      Neutrophil count decreased Grade 2 Definite 115    Platelet count decreased Grade 0 -      Diarrhea Grade 0 -       Baseline is 1 stool/ 24 hrs  Dyspnea Grade 0 -      Edema: limbs Grade 0 -      Fatigue  Grade 2  Probable 115 Moderate, occasionally limiting ADLs (unchanged from previous)  Fever Grade 0 -      Insomnia  Grade 1 Unlikely 115    Irritability Grade 0 -      Muscle weakness trunk Grade 0 -      Nausea Grade 1 -   115    Peripheral motor neuropathy Grade 0 -      Peripheral sensory neuropathy  Grade 2 Unlikely 115 Moderate symptoms, unchanged from previous  Rash acneiform Grade 0  -      Vomiting Grade 0 -        Non-reportable AEs (unsolicited, < Grade 3): Moderate back pain (grade 2) Decreased WBC (grade 1)  Headache/migraine left sided (grade 1)  Left knee pain (grade 2) Left arm ache (grade 2)  Maintenance therapy with Revlimid:  Patient returned completed cycle 115 Medication Calendar, confirming dosing at dose level -3, Revlimid 5mg  every  other day, Days 1-21. Patient notes no missed doses of Revlimid this cycle.  Patient given printed appointment calendar with Cycle 116 treatment days marked, to document doses for the next treatment cycle to begin today.   Plan; Research nurse will call patient with results of CBC this morning.  Patient called after CBC resulted and message was left informing patient that results are okay to start next cycle of revlimid today as planned. Return for next lab appointment on 02/16/24 as scheduled. Asked patient to call back if any questions.   Domenica Reamer, BSN, RN, [employer] Research Nurse II [phone] 01/19/2024

## 2024-02-07 ENCOUNTER — Other Ambulatory Visit: Payer: Self-pay | Admitting: Hematology and Oncology

## 2024-02-07 NOTE — Telephone Encounter (Signed)
Pls refill electronically °

## 2024-02-16 ENCOUNTER — Encounter: Payer: Self-pay | Admitting: *Deleted

## 2024-02-16 ENCOUNTER — Inpatient Hospital Stay: Payer: Medicare Other | Attending: Hematology and Oncology

## 2024-02-16 DIAGNOSIS — C9001 Multiple myeloma in remission: Secondary | ICD-10-CM | POA: Diagnosis not present

## 2024-02-16 LAB — CBC WITH DIFFERENTIAL (CANCER CENTER ONLY)
Abs Immature Granulocytes: 0.01 10*3/uL (ref 0.00–0.07)
Basophils Absolute: 0 10*3/uL (ref 0.0–0.1)
Basophils Relative: 1 %
Eosinophils Absolute: 0.2 10*3/uL (ref 0.0–0.5)
Eosinophils Relative: 4 %
HCT: 37.6 % (ref 36.0–46.0)
Hemoglobin: 11.5 g/dL — ABNORMAL LOW (ref 12.0–15.0)
Immature Granulocytes: 0 %
Lymphocytes Relative: 53 %
Lymphs Abs: 2.3 10*3/uL (ref 0.7–4.0)
MCH: 22.7 pg — ABNORMAL LOW (ref 26.0–34.0)
MCHC: 30.6 g/dL (ref 30.0–36.0)
MCV: 74.2 fL — ABNORMAL LOW (ref 80.0–100.0)
Monocytes Absolute: 0.6 10*3/uL (ref 0.1–1.0)
Monocytes Relative: 14 %
Neutro Abs: 1.2 10*3/uL — ABNORMAL LOW (ref 1.7–7.7)
Neutrophils Relative %: 28 %
Platelet Count: 224 10*3/uL (ref 150–400)
RBC: 5.07 MIL/uL (ref 3.87–5.11)
RDW: 15 % (ref 11.5–15.5)
WBC Count: 4.3 10*3/uL (ref 4.0–10.5)
nRBC: 0 % (ref 0.0–0.2)

## 2024-02-16 NOTE — Research (Addendum)
 Research - CTSU ECOG-ACRIN [id number] Maintenance Cycle 117, Day 1  Patient into clinic unaccompanied today for CBC and toxicity assessment prior to beginning treatment Cycle 117 of revlimid.     Lab: CBC drawn per protocol.    Adverse Events:  Patient reported the following since last visit; Ongoing fatigue (grade 2) unchanged, not limiting self-care. 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  She denies insomnia, nausea, diarrhea, vomiting, shortness of breath, swelling, fever, irritability, motor peripheral neuropathy, or rash since last visit.  Other reported AEs include ongoing daily moderate back ache (grade 2) which she takes tramadol and methocarbamol prn.    Patient recently accompanied her mother back home to [place] and stayed for one week.  She continues to exercise swimming laps at the pool average of 3 days per week, when she is not traveling.   See AE table below.     Maintenance Cycle 116 01/19/24-02/16/24 (end of Cycle 116 = 02/15/24) Solicited &/or Reportable Events Grade Attribution to lenalidomide Cycle # Comments  Anemia Grade 1 Definite  115, 116    Hyperglycemia Grade 0 -      Lymphocyte count decreased Grade 0 -      Neutrophil count decreased Grade 2 Definite 115, 116    Platelet count decreased Grade 0 -      Diarrhea Grade 0 -       Baseline is 1 stool/ 24 hrs  Dyspnea Grade 0 -      Edema: limbs Grade 0 -      Fatigue  Grade 2  Probable 115, 116 Moderate, occasionally limiting ADLs (unchanged from previous)  Fever Grade 0 -      Insomnia  Grade 1 Unlikely 115    Insomnia Grade 0     Irritability Grade 0 -      Muscle weakness trunk Grade 0 -      Nausea Grade 1 -   115    Nausea 0     Peripheral motor neuropathy Grade 0 -      Peripheral sensory neuropathy  Grade 2 Unlikely 115, 116 Moderate symptoms, unchanged from previous  Rash acneiform Grade 0  -      Vomiting Grade 0 -        Non-reportable AEs (unsolicited, < Grade  3): Moderate back pain (grade 2)  Maintenance therapy with Revlimid:  Patient returned completed cycle 116 Medication Calendar, confirming dosing at dose level -3, Revlimid 5mg  every other day, Days 1-21. Patient notes no missed doses of Revlimid this cycle.  Patient given printed appointment calendar with Cycle 117 treatment days marked, to document doses for the next treatment cycle to begin today.   Plan; CBC resulted prior to patient leaving clinic this morning. Results were within parameters to start next cycle of revlimid today as planned and patient verbalized understanding.  Patient is currently scheduled to return for next lab appointment on 03/08/24 one week prior to when next MD appointment is due (4/15) per protocol.  The MD appointment is scheduled for 03/22/24 as Dr. Bertis Ruddy is out of the office the week of 03/15/24.  The protocol allows a +/- 7 day window for this visit so the appointment on 03/22/24 is within that window. Informed patient we will move the lab appointment on 4/8 to 4/15 to be within 7 days of the next cycle and MD appt on 4/22.  Patient understands and agrees to this plan. She understands this delays her next cycle by  1 week and to not start next cycle of revlimid until she sees MD on 4/22. Asked patient to call research if any questions before next appointment. She verbalized understanding.   Domenica Reamer, BSN, RN, [employer] Research Nurse II [phone] 02/16/2024

## 2024-02-29 DIAGNOSIS — H25813 Combined forms of age-related cataract, bilateral: Secondary | ICD-10-CM | POA: Diagnosis not present

## 2024-02-29 DIAGNOSIS — H40022 Open angle with borderline findings, high risk, left eye: Secondary | ICD-10-CM | POA: Diagnosis not present

## 2024-02-29 DIAGNOSIS — H524 Presbyopia: Secondary | ICD-10-CM | POA: Diagnosis not present

## 2024-02-29 DIAGNOSIS — H401111 Primary open-angle glaucoma, right eye, mild stage: Secondary | ICD-10-CM | POA: Diagnosis not present

## 2024-02-29 DIAGNOSIS — E119 Type 2 diabetes mellitus without complications: Secondary | ICD-10-CM | POA: Diagnosis not present

## 2024-03-05 ENCOUNTER — Other Ambulatory Visit: Payer: Self-pay | Admitting: Hematology and Oncology

## 2024-03-07 NOTE — Telephone Encounter (Signed)
Pls refill electronically °

## 2024-03-08 ENCOUNTER — Inpatient Hospital Stay: Payer: Medicare Other

## 2024-03-15 ENCOUNTER — Inpatient Hospital Stay: Attending: Hematology and Oncology

## 2024-03-15 DIAGNOSIS — C9001 Multiple myeloma in remission: Secondary | ICD-10-CM | POA: Insufficient documentation

## 2024-03-15 DIAGNOSIS — Z006 Encounter for examination for normal comparison and control in clinical research program: Secondary | ICD-10-CM | POA: Diagnosis not present

## 2024-03-15 DIAGNOSIS — Z79899 Other long term (current) drug therapy: Secondary | ICD-10-CM | POA: Insufficient documentation

## 2024-03-15 DIAGNOSIS — R768 Other specified abnormal immunological findings in serum: Secondary | ICD-10-CM | POA: Insufficient documentation

## 2024-03-15 DIAGNOSIS — D61810 Antineoplastic chemotherapy induced pancytopenia: Secondary | ICD-10-CM | POA: Insufficient documentation

## 2024-03-15 DIAGNOSIS — Z7961 Long term (current) use of immunomodulator: Secondary | ICD-10-CM | POA: Insufficient documentation

## 2024-03-15 LAB — CMP (CANCER CENTER ONLY)
ALT: 23 U/L (ref 0–44)
AST: 22 U/L (ref 15–41)
Albumin: 4.1 g/dL (ref 3.5–5.0)
Alkaline Phosphatase: 53 U/L (ref 38–126)
Anion gap: 3 — ABNORMAL LOW (ref 5–15)
BUN: 13 mg/dL (ref 8–23)
CO2: 30 mmol/L (ref 22–32)
Calcium: 9.3 mg/dL (ref 8.9–10.3)
Chloride: 108 mmol/L (ref 98–111)
Creatinine: 0.92 mg/dL (ref 0.44–1.00)
GFR, Estimated: >60 mL/min (ref >60)
Glucose, Bld: 87 mg/dL (ref 70–99)
Potassium: 3.9 mmol/L (ref 3.5–5.1)
Sodium: 141 mmol/L (ref 135–145)
Total Bilirubin: 0.7 mg/dL (ref 0.0–1.2)
Total Protein: 7.7 g/dL (ref 6.5–8.1)

## 2024-03-15 LAB — CBC WITH DIFFERENTIAL (CANCER CENTER ONLY)
Abs Immature Granulocytes: 0.01 10*3/uL (ref 0.00–0.07)
Basophils Absolute: 0 10*3/uL (ref 0.0–0.1)
Basophils Relative: 1 %
Eosinophils Absolute: 0.1 10*3/uL (ref 0.0–0.5)
Eosinophils Relative: 2 %
HCT: 37 % (ref 36.0–46.0)
Hemoglobin: 11.5 g/dL — ABNORMAL LOW (ref 12.0–15.0)
Immature Granulocytes: 0 %
Lymphocytes Relative: 52 %
Lymphs Abs: 1.7 10*3/uL (ref 0.7–4.0)
MCH: 22.9 pg — ABNORMAL LOW (ref 26.0–34.0)
MCHC: 31.1 g/dL (ref 30.0–36.0)
MCV: 73.6 fL — ABNORMAL LOW (ref 80.0–100.0)
Monocytes Absolute: 0.5 10*3/uL (ref 0.1–1.0)
Monocytes Relative: 15 %
Neutro Abs: 1 10*3/uL — ABNORMAL LOW (ref 1.7–7.7)
Neutrophils Relative %: 30 %
Platelet Count: 195 10*3/uL (ref 150–400)
RBC: 5.03 MIL/uL (ref 3.87–5.11)
RDW: 15.8 % — ABNORMAL HIGH (ref 11.5–15.5)
WBC Count: 3.3 10*3/uL — ABNORMAL LOW (ref 4.0–10.5)
nRBC: 0 % (ref 0.0–0.2)

## 2024-03-15 LAB — LACTATE DEHYDROGENASE: LDH: 152 U/L (ref 98–192)

## 2024-03-15 LAB — TSH: TSH: 1.348 u[IU]/mL (ref 0.350–4.500)

## 2024-03-16 LAB — KAPPA/LAMBDA LIGHT CHAINS
Kappa free light chain: 25.3 mg/L — ABNORMAL HIGH (ref 3.3–19.4)
Kappa, lambda light chain ratio: 1.4 (ref 0.26–1.65)
Lambda free light chains: 18.1 mg/L (ref 5.7–26.3)

## 2024-03-17 LAB — MULTIPLE MYELOMA PANEL, SERUM
Albumin SerPl Elph-Mcnc: 3.5 g/dL (ref 2.9–4.4)
Albumin/Glob SerPl: 1.1 (ref 0.7–1.7)
Alpha 1: 0.2 g/dL (ref 0.0–0.4)
Alpha2 Glob SerPl Elph-Mcnc: 0.6 g/dL (ref 0.4–1.0)
B-Globulin SerPl Elph-Mcnc: 1.1 g/dL (ref 0.7–1.3)
Gamma Glob SerPl Elph-Mcnc: 1.7 g/dL (ref 0.4–1.8)
Globulin, Total: 3.5 g/dL (ref 2.2–3.9)
IgA: 117 mg/dL (ref 87–352)
IgG (Immunoglobin G), Serum: 1934 mg/dL — ABNORMAL HIGH (ref 586–1602)
IgM (Immunoglobulin M), Srm: 54 mg/dL (ref 26–217)
Total Protein ELP: 7 g/dL (ref 6.0–8.5)

## 2024-03-22 ENCOUNTER — Encounter: Payer: Self-pay | Admitting: Hematology and Oncology

## 2024-03-22 ENCOUNTER — Encounter: Payer: Self-pay | Admitting: *Deleted

## 2024-03-22 ENCOUNTER — Inpatient Hospital Stay: Payer: Medicare Other | Admitting: Hematology and Oncology

## 2024-03-22 VITALS — BP 143/83 | HR 58 | Temp 98.0°F | Resp 18 | Ht 67.0 in | Wt 164.6 lb

## 2024-03-22 DIAGNOSIS — Z006 Encounter for examination for normal comparison and control in clinical research program: Secondary | ICD-10-CM | POA: Diagnosis not present

## 2024-03-22 DIAGNOSIS — D61811 Other drug-induced pancytopenia: Secondary | ICD-10-CM

## 2024-03-22 DIAGNOSIS — C9001 Multiple myeloma in remission: Secondary | ICD-10-CM

## 2024-03-22 NOTE — Research (Signed)
 Research - CTSU ECOG-ACRIN [id number] Maintenance Cycle 118 Day 1  Patient into clinic unaccompanied today for evaluation prior to beginning treatment Cycle 118 of revlimid . This cycle is delayed by one week due to scheduling logistics and is within the 7 day window allowed for this visit.   Labs w/myeloma panel: Blood drawn on 03/15/2024 within 7-day protocol window to allow adequate time for results prior to treatment. Dr. Marton Sleeper reviewed all lab results and found them to be within parameters for continued dosing at Maintenance phase Dose Level -3, Revlimid  5 mg every other day for 21 days, every 28 days.  Per Dr. Marton Sleeper, bone marrow biopsy and aspirate would not be considered standard of care at this time. In addition, Dr. Marton Sleeper states that 24 hr urine is not required to assess disease response.    Adverse Events:  Patient reported the following since last visit; Ongoing fatigue (grade 2) unchanged, not limiting self-care. 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 Insomnia (grade 1) x 1 this past month- she did not take any medications to help sleep. Diarrhea (grades 1-2) pt had grade 1 x 2 occasions and grade 2 on one day with 6 bowel movements that day. She did not take any medication for it and it resolved on it's own.  She denies nausea, vomiting, shortness of breath, swelling, fever, irritability, motor peripheral neuropathy, or rash since last visit.  Other reported AEs include ongoing daily moderate back ache (grade 2) which she takes tramadol  and methocarbamol  prn.    She continues to exercise swimming laps at the pool average of 3 days per week.   See AE table below.     Maintenance Cycle 115-117 12/22/23-03/22/24 (end of Cycle 117 = 03/21/24) Solicited &/or Reportable Events Grade Attribution to lenalidomide  Cycle # Comments  Anemia Grade 1 Definite  115, 116, 117    Hyperglycemia Grade 0 -      Lymphocyte count decreased Grade 0 -      Neutrophil count  decreased Grade 2 Definite 115, 116, 117    Platelet count decreased Grade 0 -      Diarrhea Grade 2 Possible      117 Baseline is 1 stool/ 24 hrs  Dyspnea Grade 0 -      Edema: limbs Grade 0 -      Fatigue  Grade 2  Probable 115, 116, 117 Moderate, occasionally limiting ADLs (unchanged from previous)  Fever Grade 0 -      Insomnia  Grade 1 Unlikely 115, 117    Insomnia Grade 0     Irritability Grade 0 -      Muscle weakness trunk Grade 0 -      Nausea 1 Unrelated 115 Associated with migraine pain and resolved when migraine resolved  Peripheral motor neuropathy Grade 0 -      Peripheral sensory neuropathy  Grade 2 Unlikely 115, 116, 117 Moderate symptoms, unchanged from previous  Rash acneiform Grade 0  -      Vomiting Grade 0 -        Non-reportable AEs (unsolicited, < Grade 3): Moderate back pain (grade 2) Decreased WBC (grade 1)  Headache/migraine left sided (grade 1)  Left knee pain (grade 2) Left arm ache (grade 2)  History and physical exam: See MD note dated today.  Maintenance therapy with Revlimid :  Patient returned completed cycle 117 Medication Calendar, confirming dosing at dose level -3, Revlimid  5mg  every other day, Days 1-21. Patient notes no missed  doses of Revlimid  this cycle.  Patient given printed appointment calendar with Cycle 118 treatment days marked, to document doses for the next treatment cycle to begin today.   Plan: Patient understands to start next cycle of Revlimid  today.  Next lab appointment will be scheduled for 04/19/24 to assess AEs and CBC prior to the next cycle of Revlimid . Encouraged patient to call research nurse if any questions prior to next visit. She verbalized understanding.   Aurora Lees, BSN, RN, [employer] Research Nurse II [phone] 03/22/2024

## 2024-03-22 NOTE — Assessment & Plan Note (Addendum)
This is due to her treatment She is not symptomatic Observe 

## 2024-03-22 NOTE — Assessment & Plan Note (Addendum)
 Her recent myeloma panel revealed that she remained in complete remission Her slightly elevated free light chains and elevated IgG are unrelated The patient is comfortable to remain on Revlimid  indefinitely. She will continue treatment per research protocol She will continue calcium with vitamin D  supplement and aspirin  for DVT prophylaxis She is not on Zometa  due to history of osteonecrosis of the jaw

## 2024-03-22 NOTE — Progress Notes (Signed)
 [facility] OFFICE PROGRESS NOTE  Patient Care Team: Jearldine Mina, MD as PCP - General (Internal Medicine) Almeda Jacobs, MD as Consulting Physician (Hematology and Oncology)  Assessment & Plan Multiple myeloma in remission [facility]) Her recent myeloma panel revealed that she remained in complete remission Her slightly elevated free light chains and elevated IgG are unrelated The patient is comfortable to remain on Revlimid  indefinitely. She will continue treatment per research protocol She will continue calcium with vitamin D  supplement and aspirin  for DVT prophylaxis She is not on Zometa  due to history of osteonecrosis of the jaw  Drug-induced pancytopenia (HCC) This is due to her treatment She is not symptomatic Observe  No orders of the defined types were placed in this encounter.    Almeda Jacobs, MD  INTERVAL HISTORY: she returns for treatment follow-up on maintenance lenalidomide  for diagnosis of multiple myeloma She is currently on research protocol Complications related to previous cycle of chemotherapy included pancytopenia, Denies recent infection No new bone pain  PHYSICAL EXAMINATION: ECOG PERFORMANCE STATUS: 0 - Asymptomatic  Vitals:   03/22/24 0808  BP: (!) 143/83  Pulse: (!) 58  Resp: 18  Temp: 98 F (36.7 C)  SpO2: 100%   Filed Weights   03/22/24 0808  Weight: 164 lb 9.6 oz (74.7 kg)    Relevant data reviewed during this visit included CBC, CMP and recent myeloma panel

## 2024-03-31 ENCOUNTER — Other Ambulatory Visit: Payer: Self-pay | Admitting: Internal Medicine

## 2024-03-31 DIAGNOSIS — Z1231 Encounter for screening mammogram for malignant neoplasm of breast: Secondary | ICD-10-CM

## 2024-04-07 ENCOUNTER — Ambulatory Visit (HOSPITAL_COMMUNITY)
Admission: RE | Admit: 2024-04-07 | Discharge: 2024-04-07 | Disposition: A | Discharge status: Home / Self Care | Source: Ambulatory Visit | Attending: Vascular Surgery | Admitting: Vascular Surgery

## 2024-04-07 ENCOUNTER — Telehealth: Payer: Self-pay | Admitting: *Deleted

## 2024-04-07 ENCOUNTER — Telehealth: Payer: Self-pay

## 2024-04-07 ENCOUNTER — Other Ambulatory Visit (HOSPITAL_COMMUNITY): Payer: Self-pay | Admitting: Internal Medicine

## 2024-04-07 ENCOUNTER — Encounter (HOSPITAL_COMMUNITY): Payer: Self-pay | Admitting: Hematology and Oncology

## 2024-04-07 DIAGNOSIS — I10 Essential (primary) hypertension: Secondary | ICD-10-CM | POA: Diagnosis not present

## 2024-04-07 DIAGNOSIS — M7989 Other specified soft tissue disorders: Secondary | ICD-10-CM | POA: Insufficient documentation

## 2024-04-07 DIAGNOSIS — E785 Hyperlipidemia, unspecified: Secondary | ICD-10-CM | POA: Diagnosis not present

## 2024-04-07 DIAGNOSIS — E039 Hypothyroidism, unspecified: Secondary | ICD-10-CM | POA: Diagnosis not present

## 2024-04-07 DIAGNOSIS — G43909 Migraine, unspecified, not intractable, without status migrainosus: Secondary | ICD-10-CM | POA: Diagnosis not present

## 2024-04-07 DIAGNOSIS — E1139 Type 2 diabetes mellitus with other diabetic ophthalmic complication: Secondary | ICD-10-CM | POA: Diagnosis not present

## 2024-04-07 DIAGNOSIS — D61811 Other drug-induced pancytopenia: Secondary | ICD-10-CM | POA: Diagnosis not present

## 2024-04-07 DIAGNOSIS — C9001 Multiple myeloma in remission: Secondary | ICD-10-CM | POA: Diagnosis not present

## 2024-04-07 DIAGNOSIS — I809 Phlebitis and thrombophlebitis of unspecified site: Secondary | ICD-10-CM | POA: Diagnosis not present

## 2024-04-07 DIAGNOSIS — I700 Atherosclerosis of aorta: Secondary | ICD-10-CM | POA: Diagnosis not present

## 2024-04-07 NOTE — Telephone Encounter (Signed)
 Returned call to Dr. Lavonna Prader office and spoke with Odilia Bennett. Per Dr. Marton Sleeper, Prabhnoor does not have a DVT and does not need treatment. Odilia Bennett verbalized understanding.

## 2024-04-07 NOTE — Telephone Encounter (Signed)
 [id number] Study; Patient called to report tenderness in her left antecubital fossa which has been tender since last Friday 04/01/24.  She states her PCP did an ultrasound on it today and is considering whether or not to start patient on blood thinner.  Dr. Marton Sleeper made aware and she reviewed the preliminary venous ultrasound report.  Dr. Marton Sleeper states this is not a DVT and not related to Revlimid . Dr. Marton Sleeper states no intervention required from her standpoint and patient may continue Revlimid  as directed and continue Aspirin  325 mg daily which patient states she has been taking daily as prescribed.  Called patient back and notified her of above. Patient verbalized understanding and confirmed her lab appointment for 04/19/24. Aurora Lees, BSN, RN, [employer] Research Nurse II [phone] 04/07/2024 12:44 PM

## 2024-04-07 NOTE — Progress Notes (Signed)
 Left upper extremity venous duplex performed. No evidence of deep vein thrombosis. Evidence of acute basilic vein superficial thrombophlebitis in the left arm and forearm. Preliminary report given to Dr. Markel Silber office, patient instructed to return to the office for further evaluation and treatment. Final report to follow.

## 2024-04-09 ENCOUNTER — Other Ambulatory Visit: Payer: Self-pay | Admitting: Hematology and Oncology

## 2024-04-11 NOTE — Telephone Encounter (Signed)
Pls refill electronically °

## 2024-04-19 ENCOUNTER — Encounter: Payer: Self-pay | Admitting: *Deleted

## 2024-04-19 ENCOUNTER — Inpatient Hospital Stay: Attending: Hematology and Oncology

## 2024-04-19 ENCOUNTER — Other Ambulatory Visit: Payer: Self-pay | Admitting: *Deleted

## 2024-04-19 DIAGNOSIS — C9001 Multiple myeloma in remission: Secondary | ICD-10-CM

## 2024-04-19 DIAGNOSIS — Z006 Encounter for examination for normal comparison and control in clinical research program: Secondary | ICD-10-CM | POA: Diagnosis not present

## 2024-04-19 LAB — CBC WITH DIFFERENTIAL (CANCER CENTER ONLY)
Abs Immature Granulocytes: 0 10*3/uL (ref 0.00–0.07)
Basophils Absolute: 0 10*3/uL (ref 0.0–0.1)
Basophils Relative: 1 %
Eosinophils Absolute: 0.1 10*3/uL (ref 0.0–0.5)
Eosinophils Relative: 2 %
HCT: 36.8 % (ref 36.0–46.0)
Hemoglobin: 11.4 g/dL — ABNORMAL LOW (ref 12.0–15.0)
Immature Granulocytes: 0 %
Lymphocytes Relative: 51 %
Lymphs Abs: 2.1 10*3/uL (ref 0.7–4.0)
MCH: 22.7 pg — ABNORMAL LOW (ref 26.0–34.0)
MCHC: 31 g/dL (ref 30.0–36.0)
MCV: 73.2 fL — ABNORMAL LOW (ref 80.0–100.0)
Monocytes Absolute: 0.5 10*3/uL (ref 0.1–1.0)
Monocytes Relative: 14 %
Neutro Abs: 1.3 10*3/uL — ABNORMAL LOW (ref 1.7–7.7)
Neutrophils Relative %: 32 %
Platelet Count: 225 10*3/uL (ref 150–400)
RBC: 5.03 MIL/uL (ref 3.87–5.11)
RDW: 15.9 % — ABNORMAL HIGH (ref 11.5–15.5)
WBC Count: 4 10*3/uL (ref 4.0–10.5)
nRBC: 0 % (ref 0.0–0.2)

## 2024-04-19 NOTE — Research (Signed)
 Research - [facility] ECOG-ACRIN [id number] Maintenance Cycle 119 Day 1  Patient into clinic unaccompanied today for CBC and toxicity assessment prior to beginning treatment Cycle 119 of revlimid .     Lab: CBC drawn per protocol.    Adverse Events:  Patient reported the following since last visit; Ongoing fatigue (grade 2) unchanged, not limiting self-care. 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 Diarrhea (grade 1), pt loose stools (not more than 3 over baseline) on 3 separate days. She did not take any medication for it and it resolved on it's own.  She denies nausea, vomiting, shortness of breath, swelling, fever, irritability, motor peripheral neuropathy, or rash since last visit.  Other reported AEs include ongoing daily moderate back ache (grade 2) which she took methocarbamol  prn.   Patient continues to have some tenderness in her left forearm where she was diagnosed with a superficial thrombophlebitis by ultrasound on 04/07/24.  Dr. Marton Sleeper states this is not related to Revlimid  and no intervention needed.  She continues to exercise swimming laps at the pool average of 3 days per week.   See AE table below.     Maintenance Cycle 118 03/22/24-04/19/24 (end of Cycle 118 = 04/18/24) Solicited &/or Reportable Events Grade Attribution to lenalidomide  Cycle # Comments  Anemia Grade 1 Definite 118     Hyperglycemia Grade 0 -      Lymphocyte count decreased Grade 0 -      Neutrophil count decreased Grade 2 Definite 118    Platelet count decreased Grade 0 -      Diarrhea Grade 1 Possible      118 Baseline is 1 stool/ 24 hrs  Dyspnea Grade 0 -      Edema: limbs Grade 0 -      Fatigue  Grade 2  Probable 118 Moderate, occasionally limiting ADLs (unchanged from previous)  Fever Grade 0 -      Insomnia  Grade 0 -     Irritability Grade 0 -      Muscle weakness trunk Grade 0 -      Nausea 0 -    Peripheral motor neuropathy Grade 0 -      Peripheral sensory neuropathy  Grade 2  Unlikely 118 Moderate symptoms, unchanged from previous  Rash acneiform Grade 0  -      Vomiting Grade 0 -        Non-reportable AEs (unsolicited, < Grade 3): Moderate back pain (grade 2) Superficial thrombophlebitis left basilic vein in forearm (grade 1)  Maintenance therapy with Revlimid :  Patient returned completed cycle 118 Medication Calendar, confirming dosing at dose level -3, Revlimid  5mg  every other day, Days 1-21. Patient notes no missed doses of Revlimid  this cycle.  Patient given printed appointment calendar with Cycle 119 treatment days marked, to document doses for the next treatment cycle to begin today depending on CBC results.   Plan: Research nurse called patient after CBC resulted to inform that results are within parameters to start next cycle of Revlimid  today.  Next lab appointment is scheduled for 05/17/24 to assess AEs and CBC prior to the next cycle of Revlimid . Encouraged patient to call research nurse if any questions prior to next visit. She verbalized understanding.   Aurora Lees, BSN, RN, [employer] Research Nurse II [phone] 04/19/2024

## 2024-05-07 ENCOUNTER — Other Ambulatory Visit: Payer: Self-pay | Admitting: Hematology and Oncology

## 2024-05-09 ENCOUNTER — Other Ambulatory Visit: Payer: Self-pay

## 2024-05-09 MED ORDER — LENALIDOMIDE 5 MG PO CAPS: 5.0000 mg | ORAL_CAPSULE | ORAL | 0 refills | Status: DC

## 2024-05-10 ENCOUNTER — Ambulatory Visit
Admission: RE | Admit: 2024-05-10 | Discharge: 2024-05-10 | Disposition: A | Discharge status: Home / Self Care | Source: Ambulatory Visit | Attending: Internal Medicine | Admitting: Internal Medicine

## 2024-05-10 DIAGNOSIS — Z1231 Encounter for screening mammogram for malignant neoplasm of breast: Secondary | ICD-10-CM | POA: Diagnosis not present

## 2024-05-17 ENCOUNTER — Inpatient Hospital Stay: Attending: Hematology and Oncology

## 2024-05-17 ENCOUNTER — Encounter: Payer: Self-pay | Admitting: *Deleted

## 2024-05-17 DIAGNOSIS — C9001 Multiple myeloma in remission: Secondary | ICD-10-CM | POA: Insufficient documentation

## 2024-05-17 LAB — CBC WITH DIFFERENTIAL (CANCER CENTER ONLY)
Abs Immature Granulocytes: 0 10*3/uL (ref 0.00–0.07)
Basophils Absolute: 0 10*3/uL (ref 0.0–0.1)
Basophils Relative: 1 %
Eosinophils Absolute: 0.1 10*3/uL (ref 0.0–0.5)
Eosinophils Relative: 2 %
HCT: 37 % (ref 36.0–46.0)
Hemoglobin: 11.6 g/dL — ABNORMAL LOW (ref 12.0–15.0)
Immature Granulocytes: 0 %
Lymphocytes Relative: 54 %
Lymphs Abs: 1.9 10*3/uL (ref 0.7–4.0)
MCH: 22.7 pg — ABNORMAL LOW (ref 26.0–34.0)
MCHC: 31.4 g/dL (ref 30.0–36.0)
MCV: 72.3 fL — ABNORMAL LOW (ref 80.0–100.0)
Monocytes Absolute: 0.5 10*3/uL (ref 0.1–1.0)
Monocytes Relative: 14 %
Neutro Abs: 1.1 10*3/uL — ABNORMAL LOW (ref 1.7–7.7)
Neutrophils Relative %: 29 %
Platelet Count: 193 10*3/uL (ref 150–400)
RBC: 5.12 MIL/uL — ABNORMAL HIGH (ref 3.87–5.11)
RDW: 15.8 % — ABNORMAL HIGH (ref 11.5–15.5)
WBC Count: 3.6 10*3/uL — ABNORMAL LOW (ref 4.0–10.5)
nRBC: 0 % (ref 0.0–0.2)

## 2024-05-17 NOTE — Research (Signed)
 Research - [facility] ECOG-ACRIN [id number] Maintenance Cycle 120 Day 1  Patient into clinic unaccompanied today for CBC and toxicity assessment prior to beginning treatment Cycle 120 of revlimid .     Lab: CBC drawn per protocol.    Adverse Events:  Patient reported the following since last visit; Ongoing fatigue (grade 2) unchanged, not limiting self-care. 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 Diarrhea (grade 1), pt loose stools (not more than 3 over baseline) on at least 2 separate days. She did not take any medication for it and it resolved on it's own.  She denies nausea, vomiting, shortness of breath, swelling, fever, irritability, motor peripheral neuropathy, or rash since last visit.  Other reported AEs include ongoing daily moderate back ache (grade 2) which she occasionally takes tramadol  and methocarbamol .     She continues to exercise swimming laps at the pool average of 3 days per week.   See AE table below.     Maintenance Cycle 119 04/19/24-05/17/24 (end of Cycle 119 = 05/16/24) Solicited &/or Reportable Events Grade Attribution to lenalidomide  Cycle # Comments  Anemia Grade 1 Definite 119    Hyperglycemia Grade 0 -      Lymphocyte count decreased Grade 0 -      Neutrophil count decreased Grade 2 Definite 119    Platelet count decreased Grade 0 -      Diarrhea Grade 1 Possible      119 Baseline is 1 stool/ 24 hrs  Dyspnea Grade 0 -      Edema: limbs Grade 0 -      Fatigue  Grade 2  Probable 119 Moderate, occasionally limiting ADLs (unchanged from previous)  Fever Grade 0 -      Insomnia  Grade 0 -     Irritability Grade 0 -      Muscle weakness trunk Grade 0 -      Nausea 0 -    Peripheral motor neuropathy Grade 0 -      Peripheral sensory neuropathy  Grade 2 Unlikely 119 Moderate symptoms, unchanged from previous  Rash acneiform Grade 0  -      Vomiting Grade 0 -        Non-reportable AEs (unsolicited, < Grade 3): Moderate back pain (grade  2) Decreased WBC (grade 1)  Maintenance therapy with Revlimid :  Patient returned completed cycle 119 Medication Calendar, confirming dosing at dose level -3, Revlimid  5mg  every other day, Days 1-21. Patient notes no missed doses of Revlimid  this cycle.  Patient given printed appointment calendar with Cycle 120 treatment days marked, to document doses for the next treatment cycle to begin today depending on CBC results.   Plan: Research nurse called patient after CBC resulted to inform that results are within parameters to start next cycle of Revlimid  today.  Next lab appointment is scheduled for 06/07/24, one week prior to next MD visit, to allow time for Myeloma panel results prior to MD visit. Bone Survey is also ordered per protocol to be completed every 12 cycles for disease assessment.  Patient understands that radiology will call her with appointment for the Bone Survey expected on 06/07/24.  Encouraged patient to call research nurse if any questions prior to next visit. She verbalized understanding.   Aurora Lees, BSN, RN, [employer] Research Nurse II [phone] 05/17/2024

## 2024-06-02 ENCOUNTER — Telehealth: Payer: Self-pay

## 2024-06-02 MED ORDER — LENALIDOMIDE 5 MG PO CAPS: 5.0000 mg | ORAL_CAPSULE | ORAL | 0 refills | Status: DC

## 2024-06-02 NOTE — Telephone Encounter (Signed)
 Incoming fax received today from [employer] Lenalidomide  [employer] requesting [employer] auth renewal as the pt has 7 days supply left of her Lenalidomide . This message sent to MD for review and to advise if there are any dose changes needed.

## 2024-06-02 NOTE — Telephone Encounter (Signed)
 Refill sent per MD with [facility]#[id number].

## 2024-06-02 NOTE — Telephone Encounter (Signed)
 No new changes to her dose

## 2024-06-07 ENCOUNTER — Ambulatory Visit (HOSPITAL_COMMUNITY)
Admission: RE | Admit: 2024-06-07 | Discharge: 2024-06-07 | Disposition: A | Discharge status: Home / Self Care | Source: Ambulatory Visit | Attending: Hematology and Oncology | Admitting: Hematology and Oncology

## 2024-06-07 ENCOUNTER — Inpatient Hospital Stay: Attending: Hematology and Oncology

## 2024-06-07 DIAGNOSIS — M47816 Spondylosis without myelopathy or radiculopathy, lumbar region: Secondary | ICD-10-CM | POA: Diagnosis not present

## 2024-06-07 DIAGNOSIS — C9001 Multiple myeloma in remission: Secondary | ICD-10-CM | POA: Diagnosis present

## 2024-06-07 DIAGNOSIS — Z006 Encounter for examination for normal comparison and control in clinical research program: Secondary | ICD-10-CM | POA: Diagnosis not present

## 2024-06-07 DIAGNOSIS — C9000 Multiple myeloma not having achieved remission: Secondary | ICD-10-CM | POA: Diagnosis not present

## 2024-06-07 DIAGNOSIS — R1032 Left lower quadrant pain: Secondary | ICD-10-CM | POA: Insufficient documentation

## 2024-06-07 DIAGNOSIS — R946 Abnormal results of thyroid function studies: Secondary | ICD-10-CM | POA: Insufficient documentation

## 2024-06-07 DIAGNOSIS — Z79899 Other long term (current) drug therapy: Secondary | ICD-10-CM | POA: Insufficient documentation

## 2024-06-07 DIAGNOSIS — D61811 Other drug-induced pancytopenia: Secondary | ICD-10-CM | POA: Insufficient documentation

## 2024-06-07 LAB — CMP (CANCER CENTER ONLY)
ALT: 24 U/L (ref 0–44)
AST: 20 U/L (ref 15–41)
Albumin: 3.8 g/dL (ref 3.5–5.0)
Alkaline Phosphatase: 51 U/L (ref 38–126)
Anion gap: 6 (ref 5–15)
BUN: 12 mg/dL (ref 8–23)
CO2: 28 mmol/L (ref 22–32)
Calcium: 9.1 mg/dL (ref 8.9–10.3)
Chloride: 106 mmol/L (ref 98–111)
Creatinine: 0.82 mg/dL (ref 0.44–1.00)
GFR, Estimated: >60 mL/min (ref >60)
Glucose, Bld: 92 mg/dL (ref 70–99)
Potassium: 3.8 mmol/L (ref 3.5–5.1)
Sodium: 140 mmol/L (ref 135–145)
Total Bilirubin: 0.5 mg/dL (ref 0.0–1.2)
Total Protein: 7.1 g/dL (ref 6.5–8.1)

## 2024-06-07 LAB — CBC WITH DIFFERENTIAL (CANCER CENTER ONLY)
Abs Immature Granulocytes: 0.01 K/uL (ref 0.00–0.07)
Basophils Absolute: 0 K/uL (ref 0.0–0.1)
Basophils Relative: 1 %
Eosinophils Absolute: 0.1 K/uL (ref 0.0–0.5)
Eosinophils Relative: 3 %
HCT: 37.6 % (ref 36.0–46.0)
Hemoglobin: 11.6 g/dL — ABNORMAL LOW (ref 12.0–15.0)
Immature Granulocytes: 0 %
Lymphocytes Relative: 49 %
Lymphs Abs: 1.6 K/uL (ref 0.7–4.0)
MCH: 22.6 pg — ABNORMAL LOW (ref 26.0–34.0)
MCHC: 30.9 g/dL (ref 30.0–36.0)
MCV: 73.3 fL — ABNORMAL LOW (ref 80.0–100.0)
Monocytes Absolute: 0.4 K/uL (ref 0.1–1.0)
Monocytes Relative: 14 %
Neutro Abs: 1.1 K/uL — ABNORMAL LOW (ref 1.7–7.7)
Neutrophils Relative %: 33 %
Platelet Count: 223 K/uL (ref 150–400)
RBC: 5.13 MIL/uL — ABNORMAL HIGH (ref 3.87–5.11)
RDW: 15.8 % — ABNORMAL HIGH (ref 11.5–15.5)
WBC Count: 3.3 K/uL — ABNORMAL LOW (ref 4.0–10.5)
nRBC: 0 % (ref 0.0–0.2)

## 2024-06-07 LAB — LACTATE DEHYDROGENASE: LDH: 146 U/L (ref 98–192)

## 2024-06-07 LAB — TSH: TSH: 2.82 u[IU]/mL (ref 0.350–4.500)

## 2024-06-08 LAB — KAPPA/LAMBDA LIGHT CHAINS
Kappa free light chain: 29.7 mg/L — ABNORMAL HIGH (ref 3.3–19.4)
Kappa, lambda light chain ratio: 1.39 (ref 0.26–1.65)
Lambda free light chains: 21.3 mg/L (ref 5.7–26.3)

## 2024-06-10 LAB — MULTIPLE MYELOMA PANEL, SERUM
Albumin SerPl Elph-Mcnc: 3.7 g/dL (ref 2.9–4.4)
Albumin/Glob SerPl: 1.1 (ref 0.7–1.7)
Alpha 1: 0.2 g/dL (ref 0.0–0.4)
Alpha2 Glob SerPl Elph-Mcnc: 0.6 g/dL (ref 0.4–1.0)
B-Globulin SerPl Elph-Mcnc: 1 g/dL (ref 0.7–1.3)
Gamma Glob SerPl Elph-Mcnc: 1.7 g/dL (ref 0.4–1.8)
Globulin, Total: 3.4 g/dL (ref 2.2–3.9)
IgA: 112 mg/dL (ref 87–352)
IgG (Immunoglobin G), Serum: 1890 mg/dL — ABNORMAL HIGH (ref 586–1602)
IgM (Immunoglobulin M), Srm: 52 mg/dL (ref 26–217)
Total Protein ELP: 7.1 g/dL (ref 6.0–8.5)

## 2024-06-14 ENCOUNTER — Telehealth: Payer: Self-pay | Admitting: *Deleted

## 2024-06-14 ENCOUNTER — Inpatient Hospital Stay: Admitting: Hematology and Oncology

## 2024-06-14 ENCOUNTER — Encounter: Payer: Self-pay | Admitting: Hematology and Oncology

## 2024-06-14 ENCOUNTER — Encounter: Payer: Self-pay | Admitting: *Deleted

## 2024-06-14 VITALS — BP 143/76 | HR 62 | Temp 98.1°F | Resp 18 | Ht 67.0 in | Wt 166.0 lb

## 2024-06-14 DIAGNOSIS — D61811 Other drug-induced pancytopenia: Secondary | ICD-10-CM | POA: Diagnosis not present

## 2024-06-14 DIAGNOSIS — Z006 Encounter for examination for normal comparison and control in clinical research program: Secondary | ICD-10-CM | POA: Diagnosis not present

## 2024-06-14 DIAGNOSIS — C9001 Multiple myeloma in remission: Secondary | ICD-10-CM | POA: Diagnosis not present

## 2024-06-14 DIAGNOSIS — R1032 Left lower quadrant pain: Secondary | ICD-10-CM

## 2024-06-14 NOTE — Research (Signed)
 Research - [facility] ECOG-ACRIN [id number] Maintenance Cycle 121 Day 1  Patient into clinic unaccompanied today for evaluation prior to beginning treatment Cycle 121 of revlimid .   Labs w/myeloma panel: Blood drawn on 06/07/2024 within 7-day protocol window to allow adequate time for results prior to treatment. Dr. Lonn reviewed all lab results and found them to be within parameters for continued dosing at Maintenance phase Dose Level -3, Revlimid  5 mg every other day for 21 days, every 28 days.  Per Dr. Lonn, bone marrow biopsy and aspirate would not be considered standard of care at this time. In addition, Dr. Lonn states that 24 hr urine is not required to assess disease response.  Dr. Lonn does note that the there is a comment on the myeloma panel, Immunofixation shows IgG monoclonal protein with lambda light chain specificity. Due to this new finding Dr. Lonn considers patient meeting criteria for Very Good Partial Response per protocol defined response terms.    Adverse Events:  Patient reported the following since last visit; Ongoing fatigue (grade 2) unchanged, not limiting self-care. 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 Insomnia (grade 1), she did not take any medication to help sleep. She denies nausea, vomiting, shortness of breath, swelling, fever, irritability, motor peripheral neuropathy, or rash since last visit.  Other reported AEs include ongoing daily moderate back ache (grade 2) which she occasionally takes tramadol  and methocarbamol .     Patient does report intermittent sharp pains in the left lower quadrant (grade 2) for approximately 3 months on and off. She has no other associated symptoms. Dr. Lonn ordered CT scan to evaluate for possible diverticulitis. Dr. Lonn states the pain is not related to study treatment with Revlimid .  She continues to exercise swimming laps at the pool average of 3 days per week.   See AE table below.      Maintenance Cycle 118-120 03/22/24-06/14/24 (end of Cycle 120 = 06/13/24) Solicited &/or Reportable Events Grade Attribution to lenalidomide  Cycle # Comments  Anemia Grade 1 Definite 118,119, 120    Hyperglycemia Grade 0 -      Lymphocyte count decreased Grade 0 -      Neutrophil count decreased Grade 2 Definite 118, 119, 120    Platelet count decreased Grade 0 -      Diarrhea Grade 1 Possible     118. 119 Baseline is 1 stool/ 24 hrs  Dyspnea Grade 0 -      Edema: limbs Grade 0 -      Fatigue  Grade 2  Probable 118, 119, 120 Moderate, occasionally limiting ADLs (unchanged from previous)  Fever Grade 0 -      Insomnia  Grade 1 Unlikely  120    Irritability Grade 0 -      Muscle weakness trunk Grade 0 -      Nausea 0 -      Peripheral motor neuropathy Grade 0 -      Peripheral sensory neuropathy  Grade 2 Unlikely 118, 119, 120 Moderate symptoms, unchanged from previous  Rash acneiform Grade 0  -      Vomiting Grade 0 -        Non-reportable AEs (unsolicited, < Grade 3): Moderate back pain (grade 2) Decreased WBC (grade 1) Superficial thrombophlebitis left basilic vein in forearm (grade 1)   Maintenance therapy with Revlimid :  Patient returned completed cycle 120 Medication Calendar, confirming dosing at dose level -3, Revlimid  5mg  every other day, Days 1-21. Patient notes no  missed doses of Revlimid  this cycle.  Patient given printed appointment calendar with Cycle 121 treatment days marked, to document doses for the next treatment cycle to begin today depending on CBC results.    History and physical exam: See MD note dated today.  Plan: Patient understands to start next cycle of Revlimid  today.   Next lab appointment will be scheduled for 07/12/24 to assess AEs and CBC prior to the next cycle of Revlimid . Next labs for disease response with MD appointment will be scheduled in 3 months per protocol. Encouraged patient to call research nurse if any questions prior to next visit. She  verbalized understanding.   Cherylyn Hoard, BSN, RN, [employer] Research Nurse II [phone] 06/14/2024

## 2024-06-14 NOTE — Assessment & Plan Note (Addendum)
This is due to her treatment She is not symptomatic Observe 

## 2024-06-14 NOTE — Assessment & Plan Note (Addendum)
 This is chronic in nature It could be musculoskeletal in nature but other pathology cannot be excluded I will order CT imaging with contrast for further evaluation

## 2024-06-14 NOTE — Progress Notes (Signed)
 [facility] OFFICE PROGRESS NOTE  Patient Care Team: Ransom Other, MD as PCP - General (Internal Medicine) Lonn Hicks, MD as Consulting Physician (Hematology and Oncology)  Assessment & Plan Multiple myeloma in remission [facility]) The patient was diagnosed with stage I multiple myeloma, IgG lambda subtype in December 2014, treated with combination of lenalidomide , bortezomib  and dexamethasone  along with Zometa  between January 2015 to September 2015 with near complete response.  She participated in clinical research [facility] [phone] and has been on maintenance lenalidomide  since with complete response for many years Zometa  was discontinued in May 2015 due to osteonecrosis of the jaw which subsequently healed  Her recent myeloma panel revealed abnormal immunofixation with detectable IgG lambda Her IgG level has been chronically elevated but from previous labs were polyclonal Her lambda light chains are within normal range The patient is comfortable to remain on Revlimid  indefinitely. 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  due to history of osteonecrosis of the jaw  Drug-induced pancytopenia (HCC) This is due to her treatment She is not symptomatic Observe Left lower quadrant pain This is chronic in nature It could be musculoskeletal in nature but other pathology cannot be excluded I will order CT imaging with contrast for further evaluation  Orders Placed This Encounter  Procedures   CT ABDOMEN PELVIS W CONTRAST    Standing Status:   Future    Expected Date:   06/21/2024    Expiration Date:   06/14/2025     If indicated for the ordered procedure, I authorize the administration of contrast media per Radiology protocol:   Yes    Does the patient have a contrast media/X-ray dye allergy?:   No    If indicated for the ordered procedure, I authorize the administration of oral contrast media per Radiology protocol:   Yes    Preferred imaging location?:   [facility]     Hicks Lonn, MD  INTERVAL HISTORY: she returns for treatment follow-up on maintenance lenalidomide  Complications related to previous cycle of chemotherapy included pancytopenia, and abdominal pain According to the patient, this pain has been going on for several months It comes and goes but recently felt more persistent She denies nausea No recent changes in bowel habits although she has occasional loose stool From treatment perspective, no recent infection No recent bleeding PHYSICAL EXAMINATION: ECOG PERFORMANCE STATUS: 1 - Symptomatic but completely ambulatory  No results found for: CAN125    Latest Ref Rng & Units 06/07/2024    8:18 AM 05/17/2024    7:47 AM 04/19/2024    7:37 AM  CBC  WBC 4.0 - 10.5 K/uL 3.3  3.6  4.0   Hemoglobin 12.0 - 15.0 g/dL 88.3  88.3  88.5   Hematocrit 36.0 - 46.0 % 37.6  37.0  36.8   Platelets 150 - 400 K/uL 223  193  225       Chemistry      Component Value Date/Time   NA 140 06/07/2024 0818   NA 141 09/29/2017 0830   K 3.8 06/07/2024 0818   K 3.6 09/29/2017 0830   CL 106 06/07/2024 0818  CO2 28 06/07/2024 0818   CO2 29 09/29/2017 0830   BUN 12 06/07/2024 0818   BUN 14.0 09/29/2017 0830   CREATININE 0.82 06/07/2024 0818   CREATININE 0.9 09/29/2017 0830      Component Value Date/Time   CALCIUM 9.1 06/07/2024 0818   CALCIUM 9.6 09/29/2017 0830   ALKPHOS 51 06/07/2024 0818   ALKPHOS 60 09/29/2017 0830   AST 20 06/07/2024 0818   AST 21 09/29/2017 0830   ALT 24 06/07/2024 0818   ALT 69 (H) 09/29/2017 0830   BILITOT 0.5 06/07/2024 0818   BILITOT 0.46 09/29/2017 0830        Vitals:   06/14/24 0759  BP: (!) 143/76  Pulse: 62  Resp: 18  Temp: 98.1 F (36.7 C)  SpO2: 100%   Filed Weights   06/14/24 0759  Weight: 166 lb (75.3 kg)   GENERAL:alert, no distress and comfortable SKIN: skin color, texture, turgor are normal, no rashes or significant lesions EYES: normal, conjunctiva are pink and non-injected, sclera clear OROPHARYNX:no exudate, no erythema and lips, buccal mucosa, and tongue normal  NECK: supple, thyroid  normal size, non-tender, without nodularity LYMPH:  no palpable lymphadenopathy in the cervical, axillary or inguinal LUNGS: clear to auscultation and percussion with normal breathing effort HEART: regular rate & rhythm and no murmurs and no lower extremity edema ABDOMEN:abdomen soft, mild tenderness on the left lower quadrant without rebound or guarding Musculoskeletal:no cyanosis of digits and no clubbing  PSYCH: alert & oriented x 3 with fluent speech NEURO: no focal motor/sensory deficits  Other relevant data reviewed during this visit included CBC, CMP, myeloma panel, skeletal survey

## 2024-06-14 NOTE — Telephone Encounter (Signed)
 Pt has been scheduled for f/u appt to review scans for 7/31 at 1120 am. Pt is aware and verbalized understanding.

## 2024-06-14 NOTE — Assessment & Plan Note (Addendum)
 The patient was diagnosed with stage I multiple myeloma, IgG lambda subtype in December 2014, treated with combination of lenalidomide , bortezomib  and dexamethasone  along with Zometa  between January 2015 to September 2015 with near complete response.  She participated in clinical research [facility] [phone] and has been on maintenance lenalidomide  since with complete response for many years Zometa  was discontinued in May 2015 due to osteonecrosis of the jaw which subsequently healed  Her recent myeloma panel revealed abnormal immunofixation with detectable IgG lambda Her IgG level has been chronically elevated but from previous labs were polyclonal Her lambda light chains are within normal range The patient is comfortable to remain on Revlimid  indefinitely. 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

## 2024-06-21 ENCOUNTER — Ambulatory Visit (HOSPITAL_COMMUNITY)
Admission: RE | Admit: 2024-06-21 | Discharge: 2024-06-21 | Disposition: A | Discharge status: Home / Self Care | Source: Ambulatory Visit | Attending: Hematology and Oncology | Admitting: Hematology and Oncology

## 2024-06-21 DIAGNOSIS — R1032 Left lower quadrant pain: Secondary | ICD-10-CM | POA: Diagnosis not present

## 2024-06-21 DIAGNOSIS — N281 Cyst of kidney, acquired: Secondary | ICD-10-CM | POA: Diagnosis not present

## 2024-06-21 DIAGNOSIS — K8020 Calculus of gallbladder without cholecystitis without obstruction: Secondary | ICD-10-CM | POA: Diagnosis not present

## 2024-06-21 MED ORDER — IOHEXOL 300 MG/ML  SOLN
30.0000 mL | Freq: Once | INTRAMUSCULAR | Status: AC | PRN
  Administered 2024-06-21: 30 mL via ORAL

## 2024-06-21 MED ORDER — IOHEXOL 9 MG/ML PO SOLN: 1000.0000 mL | Freq: Once | ORAL | Status: DC | PRN

## 2024-06-21 MED ORDER — IOHEXOL 300 MG/ML  SOLN
100.0000 mL | Freq: Once | INTRAMUSCULAR | Status: AC | PRN
  Administered 2024-06-21: 100 mL via INTRAVENOUS

## 2024-06-23 ENCOUNTER — Other Ambulatory Visit: Payer: Self-pay | Admitting: *Deleted

## 2024-06-23 MED ORDER — LENALIDOMIDE 5 MG PO CAPS
5.0000 mg | ORAL_CAPSULE | ORAL | 0 refills | Status: DC
Start: 2024-06-23 — End: 2024-07-27

## 2024-06-27 ENCOUNTER — Ambulatory Visit: Admitting: Hematology and Oncology

## 2024-06-30 ENCOUNTER — Encounter: Payer: Self-pay | Admitting: Hematology and Oncology

## 2024-06-30 ENCOUNTER — Inpatient Hospital Stay: Admitting: Hematology and Oncology

## 2024-06-30 ENCOUNTER — Encounter: Payer: Self-pay | Admitting: *Deleted

## 2024-06-30 VITALS — BP 120/74 | HR 59 | Temp 97.9°F | Resp 18 | Ht 67.0 in | Wt 166.2 lb

## 2024-06-30 DIAGNOSIS — R1032 Left lower quadrant pain: Secondary | ICD-10-CM

## 2024-06-30 DIAGNOSIS — C9001 Multiple myeloma in remission: Secondary | ICD-10-CM | POA: Diagnosis not present

## 2024-06-30 DIAGNOSIS — D61811 Other drug-induced pancytopenia: Secondary | ICD-10-CM | POA: Diagnosis not present

## 2024-06-30 DIAGNOSIS — Z79899 Other long term (current) drug therapy: Secondary | ICD-10-CM | POA: Diagnosis not present

## 2024-06-30 MED ORDER — METHOCARBAMOL 500 MG PO TABS: 500.0000 mg | ORAL_TABLET | Freq: Three times a day (TID) | ORAL | 1 refills | Status: DC | PRN

## 2024-06-30 NOTE — Assessment & Plan Note (Addendum)
 I have reviewed imaging studies with the patient She does not have any thing pathological seen on CT imaging It could be musculoskeletal in nature She was reassured The bone lesion seen is not new, related to her diagnosis of multiple myeloma and does not represent disease progression She is reassured

## 2024-06-30 NOTE — Research (Signed)
 Research - [facility] ECOG-ACRIN [id number] Maintenance Cycle 121 Day 17  Patient into clinic unaccompanied today to see Dr. Lonn for review of recent CT scan. See MD note from today for details. Patient reports ongoing intermittent sharp pains in the left lower quadrant (grade 2). She has no other associated symptoms. Dr. Lonn states the CT scan did not show any cause for this pain and she thinks it could be muscular in nature. No further interventions ordered at this time. Dr. Lonn states abdominal pain is not related to study treatment with Revlimid  and patient should continue revlimid  as prescribed.   Plan: Patient to continue this cycle of revlimid  as previously directed. Next lab appointment will be scheduled for 07/12/24 to assess AEs and CBC prior to the next cycle of Revlimid .  Patient went to Scheduling to get this scheduled after the visit.  Encouraged patient to call research nurse if any questions prior to next visit. She verbalized understanding.   Cherylyn Hoard, BSN, RN, [employer] Research Nurse II [phone] 06/30/2024

## 2024-06-30 NOTE — Progress Notes (Signed)
 [facility] OFFICE PROGRESS NOTE  Patient Care Team: Ransom Other, MD as PCP - General (Internal Medicine) Lonn Hicks, MD as Consulting Physician (Hematology and Oncology)  Assessment & Plan Left lower quadrant pain I have reviewed imaging studies with the patient She does not have any thing pathological seen on CT imaging It could be musculoskeletal in nature She was reassured The bone lesion seen is not new, related to her diagnosis of multiple myeloma and does not represent disease progression She is reassured  No orders of the defined types were placed in this encounter.    Hicks Lonn, MD  INTERVAL HISTORY: she returns for surveillance follow-up and review of CT imaging She tolerated recent CT scan well I reviewed imaging studies with the patient and discussed future follow-up  PHYSICAL EXAMINATION: ECOG PERFORMANCE STATUS: 0 - Asymptomatic  Vitals:   06/30/24 1120  BP: 120/74  Pulse: (!) 59  Resp: 18  Temp: 97.9 F (36.6 C)  SpO2: 99%   Filed Weights   06/30/24 1120  Weight: 166 lb 3.2 oz (75.4 kg)    Relevant data reviewed during this visit included CT imaging from July 2025

## 2024-07-12 ENCOUNTER — Encounter: Payer: Self-pay | Admitting: *Deleted

## 2024-07-12 ENCOUNTER — Inpatient Hospital Stay: Attending: Hematology and Oncology

## 2024-07-12 DIAGNOSIS — C9001 Multiple myeloma in remission: Secondary | ICD-10-CM | POA: Diagnosis present

## 2024-07-12 DIAGNOSIS — Z006 Encounter for examination for normal comparison and control in clinical research program: Secondary | ICD-10-CM | POA: Diagnosis not present

## 2024-07-12 LAB — CBC WITH DIFFERENTIAL (CANCER CENTER ONLY)
Abs Immature Granulocytes: 0 K/uL (ref 0.00–0.07)
Basophils Absolute: 0 K/uL (ref 0.0–0.1)
Basophils Relative: 1 %
Eosinophils Absolute: 0.1 K/uL (ref 0.0–0.5)
Eosinophils Relative: 2 %
HCT: 35.5 % — ABNORMAL LOW (ref 36.0–46.0)
Hemoglobin: 11.2 g/dL — ABNORMAL LOW (ref 12.0–15.0)
Immature Granulocytes: 0 %
Lymphocytes Relative: 58 %
Lymphs Abs: 1.9 K/uL (ref 0.7–4.0)
MCH: 22.7 pg — ABNORMAL LOW (ref 26.0–34.0)
MCHC: 31.5 g/dL (ref 30.0–36.0)
MCV: 71.9 fL — ABNORMAL LOW (ref 80.0–100.0)
Monocytes Absolute: 0.4 K/uL (ref 0.1–1.0)
Monocytes Relative: 13 %
Neutro Abs: 0.8 K/uL — ABNORMAL LOW (ref 1.7–7.7)
Neutrophils Relative %: 26 %
Platelet Count: 210 K/uL (ref 150–400)
RBC: 4.94 MIL/uL (ref 3.87–5.11)
RDW: 15.9 % — ABNORMAL HIGH (ref 11.5–15.5)
WBC Count: 3.2 K/uL — ABNORMAL LOW (ref 4.0–10.5)
nRBC: 0 % (ref 0.0–0.2)

## 2024-07-12 NOTE — Research (Signed)
 Research - [facility] [id number] Maintenance Cycle 122 Day 1  Patient into clinic unaccompanied today for evaluation prior to beginning treatment Cycle 122 of revlimid .   Adverse Events:  Patient reported the following since last visit; Ongoing fatigue (grade 2) unchanged, not limiting self-care. 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 Insomnia (grade 2), she took trazodone  prn to help sleep. Patient reports diarrhea (grade 1) alternating with constipation (grade 1) for past 3 weeks.  She has not needed to take any medication for diarrhea but did take dulcolax once for the constipation. She feels like her GI system is getting better now and was affected from drinking the contrast for the CT scan a few weeks ago. She denies nausea, vomiting, shortness of breath, swelling, fever, irritability, motor peripheral neuropathy, or rash since last visit.  Other reported AEs include ongoing daily moderate back ache (grade 2) which she occasionally takes tramadol  and methocarbamol .  Patient reports the pain in the left lower quadrant has improved somewhat (grade 1) since last visit but still present intermittently.    She continues to exercise swimming laps at the pool average of 3 days per week.   See AE table below.     Maintenance Cycle 121 06/14/24 (end of Cycle 121 =) Solicited &/or Reportable Events Grade Attribution to lenalidomide  Cycle # Comments  Anemia Grade 1 Definite 121    Hyperglycemia Grade 0 -      Lymphocyte count decreased Grade 0 -      Neutrophil count decreased Grade 3 Definite 121    Platelet count decreased Grade 0 -      Diarrhea Grade 1 Possible     121 Baseline is 1 stool/ 24 hrs  Dyspnea Grade 0 -      Edema: limbs Grade 0 -      Fatigue  Grade 2  Probable 121 Moderate, occasionally limiting ADLs (unchanged from previous)  Fever Grade 0 -      Insomnia  Grade 2 Unlikely  121  trazodone  prn  Irritability Grade 0 -      Muscle weakness trunk  Grade 0 -      Nausea 0 -      Peripheral motor neuropathy Grade 0 -      Peripheral sensory neuropathy  Grade 2 Unlikely  121 Moderate symptoms, unchanged from previous  Rash acneiform Grade 0  -      Vomiting Grade 0 -        Non-reportable AEs (unsolicited, < Grade 3): Moderate back pain (grade 2) Decreased WBC (grade 1) Constipation (grade 1) Intermittent left lower abd pain (grade 1)   Maintenance therapy with Revlimid :  Patient returned completed cycle 121 Medication Calendar, confirming dosing at dose level -3, Revlimid  5mg  every other day, Days 1-21. Patient notes no missed doses of Revlimid  this cycle.     Plan: CBC results-  results available prior to patient leaving clinic today. ANC is 0.8 today which does not meet parameters to start next cycle of Revlimid  today as planned. Patient to return next Tuesday 8/19 at 8 am for repeat CBC prior to starting next cycle of Revlimid . Patient verbalized understanding to hold revlimid  and return for CBC next week.  Encouraged patient to call research nurse if any questions prior to next visit. She verbalized understanding.   Cherylyn Hoard, BSN, RN, [employer] Research Nurse II [phone] 07/12/2024

## 2024-07-19 ENCOUNTER — Encounter: Payer: Self-pay | Admitting: *Deleted

## 2024-07-19 ENCOUNTER — Inpatient Hospital Stay

## 2024-07-19 DIAGNOSIS — C9001 Multiple myeloma in remission: Secondary | ICD-10-CM

## 2024-07-19 DIAGNOSIS — Z006 Encounter for examination for normal comparison and control in clinical research program: Secondary | ICD-10-CM | POA: Diagnosis not present

## 2024-07-19 LAB — CBC WITH DIFFERENTIAL (CANCER CENTER ONLY)
Abs Immature Granulocytes: 0 K/uL (ref 0.00–0.07)
Basophils Absolute: 0 K/uL (ref 0.0–0.1)
Basophils Relative: 0 %
Eosinophils Absolute: 0 K/uL (ref 0.0–0.5)
Eosinophils Relative: 1 %
HCT: 36.7 % (ref 36.0–46.0)
Hemoglobin: 11.4 g/dL — ABNORMAL LOW (ref 12.0–15.0)
Immature Granulocytes: 0 %
Lymphocytes Relative: 44 %
Lymphs Abs: 1.5 K/uL (ref 0.7–4.0)
MCH: 22.7 pg — ABNORMAL LOW (ref 26.0–34.0)
MCHC: 31.1 g/dL (ref 30.0–36.0)
MCV: 73 fL — ABNORMAL LOW (ref 80.0–100.0)
Monocytes Absolute: 0.5 K/uL (ref 0.1–1.0)
Monocytes Relative: 17 %
Neutro Abs: 1.2 K/uL — ABNORMAL LOW (ref 1.7–7.7)
Neutrophils Relative %: 38 %
Platelet Count: 227 K/uL (ref 150–400)
RBC: 5.03 MIL/uL (ref 3.87–5.11)
RDW: 16 % — ABNORMAL HIGH (ref 11.5–15.5)
WBC Count: 3.3 K/uL — ABNORMAL LOW (ref 4.0–10.5)
nRBC: 0 % (ref 0.0–0.2)

## 2024-07-19 NOTE — Research (Signed)
 Research - [facility] [id number] Maintenance Cycle 122 Day 1  Patient into clinic unaccompanied today for evaluation prior to beginning treatment Cycle 122 of revlimid . This cycle was delayed by one week due to low ANC (0.8) last week.   Adverse Events:  Patient reported the following since last visit; Ongoing fatigue (grade 2) unchanged, not limiting self-care. 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 Insomnia (grade 2), she took trazodone  prn to help sleep. Patient reports intermittent diarrhea (grade 1) for past 4 weeks.  She has not needed to take any medication for diarrhea. She states no more than 2 to 3 episodes per day when it happens. She denies any further constipation. She denies nausea, vomiting, shortness of breath, swelling, fever, irritability, motor peripheral neuropathy, or rash since last visit.  Other reported AEs include ongoing daily moderate back ache (grade 2) which she occasionally takes tramadol  and methocarbamol .  Patient reports the pain in the left lower quadrant continues to improve (grade 1) but still present intermittently.    She continues to exercise swimming laps at the pool average of 3 days per week.   See AE table below.     Maintenance Cycle 121 06/14/24-07/19/24 (end of Cycle 121 =07/18/24) Solicited &/or Reportable Events Grade Attribution to lenalidomide  Cycle # Comments  Anemia Grade 1 Definite 121    Hyperglycemia Grade 0 -      Lymphocyte count decreased Grade 0 -      Neutrophil count decreased Grade 2 Definite 121   Neutrophil count decreased Grade 3 Definite 121    Platelet count decreased Grade 0 -      Diarrhea Grade 1 Possible     121 Baseline is 1 stool/ 24 hrs  Dyspnea Grade 0 -      Edema: limbs Grade 0 -      Fatigue  Grade 2  Probable 121 Moderate, occasionally limiting ADLs (unchanged from previous)  Fever Grade 0 -      Insomnia  Grade 2 Unlikely  121  trazodone  prn  Irritability Grade 0 -      Muscle  weakness trunk Grade 0 -      Nausea 0 -      Peripheral motor neuropathy Grade 0 -      Peripheral sensory neuropathy  Grade 2 Unlikely  121 Moderate symptoms, unchanged from previous  Rash acneiform Grade 0  -      Vomiting Grade 0 -        Non-reportable AEs (unsolicited, < Grade 3): Moderate back pain (grade 2) Decreased WBC (grade 1) Constipation (grade 1) Intermittent left lower abd pain (grade 1)   Maintenance therapy with Revlimid :  Patient returned completed cycle 121 Medication Calendar last week, confirming dosing at dose level -3, Revlimid  5mg  every other day, Days 1-21. Patient notes no missed doses of Revlimid  this cycle.     CBC results-  Called patient at home when results available. ANC is 1.2 today which meet parameters to start next cycle of Revlimid  today. Patient to return in 4 weeks on 08/16/24 for CBC prior to starting next cycle of Revlimid . Patient verbalized understanding to start revlimid  today and return for CBC next month as scheduled.   Encouraged patient to call research nurse if any questions prior to next visit. She verbalized understanding.   Cherylyn Hoard, BSN, RN, [employer] Research Nurse II [phone] 07/19/2024

## 2024-07-26 ENCOUNTER — Other Ambulatory Visit: Payer: Self-pay | Admitting: Hematology and Oncology

## 2024-07-26 DIAGNOSIS — C9001 Multiple myeloma in remission: Secondary | ICD-10-CM

## 2024-07-26 NOTE — Telephone Encounter (Signed)
Pls refill electronically °

## 2024-08-09 ENCOUNTER — Inpatient Hospital Stay

## 2024-08-16 ENCOUNTER — Encounter: Payer: Self-pay | Admitting: *Deleted

## 2024-08-16 ENCOUNTER — Other Ambulatory Visit: Payer: Self-pay | Admitting: Hematology and Oncology

## 2024-08-16 ENCOUNTER — Inpatient Hospital Stay: Attending: Hematology and Oncology

## 2024-08-16 DIAGNOSIS — C9001 Multiple myeloma in remission: Secondary | ICD-10-CM | POA: Diagnosis present

## 2024-08-16 DIAGNOSIS — Z23 Encounter for immunization: Secondary | ICD-10-CM | POA: Diagnosis not present

## 2024-08-16 LAB — CBC WITH DIFFERENTIAL (CANCER CENTER ONLY)
Abs Immature Granulocytes: 0.02 K/uL (ref 0.00–0.07)
Basophils Absolute: 0 K/uL (ref 0.0–0.1)
Basophils Relative: 1 %
Eosinophils Absolute: 0.1 K/uL (ref 0.0–0.5)
Eosinophils Relative: 2 %
HCT: 37.8 % (ref 36.0–46.0)
Hemoglobin: 11.6 g/dL — ABNORMAL LOW (ref 12.0–15.0)
Immature Granulocytes: 1 %
Lymphocytes Relative: 49 %
Lymphs Abs: 2 K/uL (ref 0.7–4.0)
MCH: 22.7 pg — ABNORMAL LOW (ref 26.0–34.0)
MCHC: 30.7 g/dL (ref 30.0–36.0)
MCV: 73.8 fL — ABNORMAL LOW (ref 80.0–100.0)
Monocytes Absolute: 0.6 K/uL (ref 0.1–1.0)
Monocytes Relative: 15 %
Neutro Abs: 1.3 K/uL — ABNORMAL LOW (ref 1.7–7.7)
Neutrophils Relative %: 32 %
Platelet Count: 210 K/uL (ref 150–400)
RBC: 5.12 MIL/uL — ABNORMAL HIGH (ref 3.87–5.11)
RDW: 15.8 % — ABNORMAL HIGH (ref 11.5–15.5)
WBC Count: 3.9 K/uL — ABNORMAL LOW (ref 4.0–10.5)
nRBC: 0 % (ref 0.0–0.2)

## 2024-08-16 MED ORDER — TRAZODONE HCL 50 MG PO TABS: 50.0000 mg | ORAL_TABLET | Freq: Every day | ORAL | 0 refills | Status: DC

## 2024-08-16 NOTE — Research (Signed)
 Research - [facility]COG[facility]CRIN [id number] Maintenance Cycle 123 Day 1  Patient into clinic unaccompanied today for evaluation prior to beginning treatment Cycle 123 of revlimid .   Adverse Events:  Patient reported the following since last visit; Ongoing fatigue (grade 2) unchanged, not limiting self-care. 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 Insomnia (grade 2), she took trazodone  prn to help sleep and is requesting refill on trazodone  today. Research RN will ask Dr. Lonn if she will refill this for patient.  She denies diarrhea, nausea, vomiting, shortness of breath, swelling, fever, irritability, muscle weakness, motor peripheral neuropathy, or rash since last visit.  Other reported AEs include ongoing daily moderate back ache (grade 2) which she occasionally takes tramadol  and methocarbamol .  Patient thinks the pain in the left lower quadrant has resolved because she has not noticed it in the past month.    She has not been swimming as often due to her husband's change of schedule but plans to start again because she feels better when she swims regularly.    See AE table below.     Maintenance Cycle 121-122 06/14/24-08/16/24 (end of Cycle 122 =08/15/24) Solicited &/or Reportable Events Grade Attribution to lenalidomide  Cycle # Comments  Anemia Grade 1 Definite 121, 122    Hyperglycemia Grade 0 -      Lymphocyte count decreased Grade 0 -      Neutrophil count decreased Grade 2 Definite 121, 122   Neutrophil count decreased Grade 3 Definite 121    Platelet count decreased Grade 0 -      Diarrhea Grade 1 Possible     121 Baseline is 1 stool/ 24 hrs  Dyspnea Grade 0 -      Edema: limbs Grade 0 -      Fatigue  Grade 2  Probable 121, 122 Moderate, occasionally limiting ADLs (unchanged from previous)  Fever Grade 0 -      Insomnia  Grade 2 Unlikely  121, 122  trazodone  prn  Irritability Grade 0 -      Muscle weakness trunk Grade 0 -      Nausea 0 -      Peripheral  motor neuropathy Grade 0 -      Peripheral sensory neuropathy  Grade 2 Unlikely  121, 122 Moderate symptoms, unchanged from previous  Rash acneiform Grade 0  -      Vomiting Grade 0 -        Non-reportable AEs (unsolicited, < Grade 3): Moderate back pain (grade 2) Decreased WBC (grade 1) Constipation (grade 1) Intermittent left lower abd pain (grade 1)   Maintenance therapy with Revlimid :  Patient returned completed cycle 122 Medication Calendar last week, confirming dosing at dose level -3, Revlimid  5mg  every other day, Days 1-21. Patient notes no missed doses of Revlimid  this cycle.     CBC results-  Called patient at home when results available. ANC is 1.3 which meets parameters to start next cycle of Revlimid  today. Patient to return in 3 weeks on 09/06/24 for full panel of myeloma labs prior to seeing Dr. Lonn the following week on 09/13/24. Patient verbalized understanding to start revlimid  today and return in 3 weeks for labs as scheduled.   Encouraged patient to call research nurse if any questions prior to next visit. She verbalized understanding.   Cherylyn Hoard, BSN, RN, [employer] Research Nurse II [phone] 08/16/2024

## 2024-08-26 ENCOUNTER — Other Ambulatory Visit: Payer: Self-pay

## 2024-08-26 DIAGNOSIS — C9001 Multiple myeloma in remission: Secondary | ICD-10-CM

## 2024-08-26 MED ORDER — LENALIDOMIDE 5 MG PO CAPS: 5.0000 mg | ORAL_CAPSULE | ORAL | 0 refills | Status: DC

## 2024-08-30 ENCOUNTER — Inpatient Hospital Stay

## 2024-09-01 ENCOUNTER — Other Ambulatory Visit: Payer: Self-pay | Admitting: Hematology and Oncology

## 2024-09-06 ENCOUNTER — Inpatient Hospital Stay: Attending: Hematology and Oncology

## 2024-09-06 ENCOUNTER — Inpatient Hospital Stay: Admitting: Hematology and Oncology

## 2024-09-06 DIAGNOSIS — Z006 Encounter for examination for normal comparison and control in clinical research program: Secondary | ICD-10-CM | POA: Diagnosis not present

## 2024-09-06 DIAGNOSIS — Z79899 Other long term (current) drug therapy: Secondary | ICD-10-CM | POA: Insufficient documentation

## 2024-09-06 DIAGNOSIS — C9001 Multiple myeloma in remission: Secondary | ICD-10-CM | POA: Diagnosis present

## 2024-09-06 DIAGNOSIS — R946 Abnormal results of thyroid function studies: Secondary | ICD-10-CM | POA: Insufficient documentation

## 2024-09-06 LAB — CBC WITH DIFFERENTIAL (CANCER CENTER ONLY)
Abs Immature Granulocytes: 0.01 K/uL (ref 0.00–0.07)
Basophils Absolute: 0 K/uL (ref 0.0–0.1)
Basophils Relative: 1 %
Eosinophils Absolute: 0.1 K/uL (ref 0.0–0.5)
Eosinophils Relative: 3 %
HCT: 40 % (ref 36.0–46.0)
Hemoglobin: 12.3 g/dL (ref 12.0–15.0)
Immature Granulocytes: 0 %
Lymphocytes Relative: 52 %
Lymphs Abs: 1.7 K/uL (ref 0.7–4.0)
MCH: 22.7 pg — ABNORMAL LOW (ref 26.0–34.0)
MCHC: 30.8 g/dL (ref 30.0–36.0)
MCV: 73.8 fL — ABNORMAL LOW (ref 80.0–100.0)
Monocytes Absolute: 0.4 K/uL (ref 0.1–1.0)
Monocytes Relative: 14 %
Neutro Abs: 1 K/uL — ABNORMAL LOW (ref 1.7–7.7)
Neutrophils Relative %: 30 %
Platelet Count: 179 K/uL (ref 150–400)
RBC: 5.42 MIL/uL — ABNORMAL HIGH (ref 3.87–5.11)
RDW: 15.8 % — ABNORMAL HIGH (ref 11.5–15.5)
WBC Count: 3.2 K/uL — ABNORMAL LOW (ref 4.0–10.5)
nRBC: 0 % (ref 0.0–0.2)

## 2024-09-06 LAB — CMP (CANCER CENTER ONLY)
ALT: 30 U/L (ref 0–44)
AST: 32 U/L (ref 15–41)
Albumin: 4 g/dL (ref 3.5–5.0)
Alkaline Phosphatase: 57 U/L (ref 38–126)
Anion gap: 5 (ref 5–15)
BUN: 11 mg/dL (ref 8–23)
CO2: 30 mmol/L (ref 22–32)
Calcium: 9.7 mg/dL (ref 8.9–10.3)
Chloride: 103 mmol/L (ref 98–111)
Creatinine: 0.87 mg/dL (ref 0.44–1.00)
GFR, Estimated: >60 mL/min (ref >60)
Glucose, Bld: 84 mg/dL (ref 70–99)
Potassium: 3.9 mmol/L (ref 3.5–5.1)
Sodium: 138 mmol/L (ref 135–145)
Total Bilirubin: 0.7 mg/dL (ref 0.0–1.2)
Total Protein: 7.9 g/dL (ref 6.5–8.1)

## 2024-09-06 LAB — LACTATE DEHYDROGENASE: LDH: 181 U/L (ref 98–192)

## 2024-09-06 LAB — TSH: TSH: 1.18 u[IU]/mL (ref 0.350–4.500)

## 2024-09-07 LAB — KAPPA/LAMBDA LIGHT CHAINS
Kappa free light chain: 28.4 mg/L — ABNORMAL HIGH (ref 3.3–19.4)
Kappa, lambda light chain ratio: 1.24 (ref 0.26–1.65)
Lambda free light chains: 22.9 mg/L (ref 5.7–26.3)

## 2024-09-09 LAB — MULTIPLE MYELOMA PANEL, SERUM
Albumin SerPl Elph-Mcnc: 3.2 g/dL (ref 2.9–4.4)
Albumin/Glob SerPl: 0.9 (ref 0.7–1.7)
Alpha 1: 0.2 g/dL (ref 0.0–0.4)
Alpha2 Glob SerPl Elph-Mcnc: 0.7 g/dL (ref 0.4–1.0)
B-Globulin SerPl Elph-Mcnc: 1.1 g/dL (ref 0.7–1.3)
Gamma Glob SerPl Elph-Mcnc: 1.8 g/dL (ref 0.4–1.8)
Globulin, Total: 3.8 g/dL (ref 2.2–3.9)
IgA: 117 mg/dL (ref 87–352)
IgG (Immunoglobin G), Serum: 1885 mg/dL — ABNORMAL HIGH (ref 586–1602)
IgM (Immunoglobulin M), Srm: 58 mg/dL (ref 26–217)
Total Protein ELP: 7 g/dL (ref 6.0–8.5)

## 2024-09-12 NOTE — Progress Notes (Addendum)
 Carla Perry                                          MRN: [id number]   10/11/2024   The [facility] Quality Team Specialist reviewed this patient medical record for the purposes of chart review for care gap closure. The following were reviewed: chart review for care gap closure-kidney health evaluation for diabetes:eGFR  and uACR.    [facility]BCI Quality Team

## 2024-09-13 ENCOUNTER — Encounter: Payer: Self-pay | Admitting: *Deleted

## 2024-09-13 ENCOUNTER — Inpatient Hospital Stay: Admitting: Hematology and Oncology

## 2024-09-13 ENCOUNTER — Encounter: Payer: Self-pay | Admitting: Hematology and Oncology

## 2024-09-13 VITALS — BP 128/75 | HR 74 | Temp 98.0°F | Resp 18 | Ht 67.0 in | Wt 168.8 lb

## 2024-09-13 DIAGNOSIS — D61811 Other drug-induced pancytopenia: Secondary | ICD-10-CM | POA: Diagnosis not present

## 2024-09-13 DIAGNOSIS — Z006 Encounter for examination for normal comparison and control in clinical research program: Secondary | ICD-10-CM | POA: Diagnosis not present

## 2024-09-13 DIAGNOSIS — M62838 Other muscle spasm: Secondary | ICD-10-CM

## 2024-09-13 DIAGNOSIS — C9001 Multiple myeloma in remission: Secondary | ICD-10-CM

## 2024-09-13 NOTE — Assessment & Plan Note (Addendum)
 She is on muscle relaxant intermittently over the years This is not due to side effects of treatment We discussed changes to her diet and strength training component of exercise as tolerated

## 2024-09-13 NOTE — Progress Notes (Signed)
 [facility] OFFICE PROGRESS NOTE  Patient Care Team: Ransom Other, MD as PCP - General (Internal Medicine) Lonn Hicks, MD as Consulting Physician (Hematology and Oncology)  Assessment & Plan Multiple myeloma in remission [facility]) The patient was diagnosed with stage I multiple myeloma, IgG lambda subtype in December 2014, treated with combination of lenalidomide , bortezomib  and dexamethasone  along with Zometa  between January 2015 to September 2015 with near complete response.  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 She will continue treatment per research protocol Recent skeletal survey in July 2025 was reviewed which showed no evidence of disease She will continue calcium with vitamin D  supplement and aspirin  for DVT prophylaxis She is not on Zometa  due to history of osteonecrosis of the jaw  Drug-induced pancytopenia This is due to her treatment She is not symptomatic Observe Muscle spasm She is on muscle relaxant intermittently over the years This is not due to side effects of treatment We discussed changes to her diet and strength training component of exercise as tolerated  No orders of the defined types were placed in this encounter.    Hicks Lonn, MD  INTERVAL HISTORY: she returns for treatment follow-up Complications related to previous cycle of chemotherapy included pancytopenia, She denies recent infection We discussed importance of annual  influenza vaccination The patient is also overdue for pneumococcal vaccination  PHYSICAL EXAMINATION: ECOG PERFORMANCE STATUS: 1 - Symptomatic but completely ambulatory  No results found for: CAN125    Latest Ref Rng & Units 09/06/2024    7:38 AM 08/16/2024    7:39 AM 07/19/2024    8:11 AM  CBC  WBC 4.0 - 10.5 K/uL 3.2  3.9  3.3   Hemoglobin 12.0 - 15.0 g/dL 87.6  88.3  88.5   Hematocrit 36.0 - 46.0 % 40.0  37.8  36.7   Platelets 150 - 400 K/uL 179  210  227       Chemistry      Component Value Date/Time   NA 138 09/06/2024 0738   NA 141 09/29/2017 0830   K 3.9 09/06/2024 0738   K 3.6 09/29/2017 0830   CL 103 09/06/2024 0738   CO2 30 09/06/2024 0738   CO2 29 09/29/2017 0830   BUN 11 09/06/2024 0738   BUN 14.0 09/29/2017 0830   CREATININE 0.87 09/06/2024 0738   CREATININE 0.9 09/29/2017 0830      Component Value Date/Time   CALCIUM 9.7 09/06/2024 0738   CALCIUM 9.6 09/29/2017 0830   ALKPHOS 57 09/06/2024 0738   ALKPHOS 60 09/29/2017 0830   AST 32 09/06/2024 0738   AST 21 09/29/2017 0830   ALT 30 09/06/2024 0738   ALT 69 (H) 09/29/2017 0830   BILITOT 0.7 09/06/2024 0738   BILITOT 0.46 09/29/2017 0830       Vitals:   09/13/24 0901  BP: 128/75  Pulse: 74  Resp: 18  Temp: 98 F (36.7 C)  SpO2: 100%   Filed Weights   09/13/24 0901  Weight: 168 lb 12.8 oz (76.6 kg)  Other relevant data reviewed during this visit included CBC, CMP, recent myeloma panel

## 2024-09-13 NOTE — Assessment & Plan Note (Addendum)
This is due to her treatment She is not symptomatic Observe 

## 2024-09-13 NOTE — Assessment & Plan Note (Addendum)
 The patient was diagnosed with stage I multiple myeloma, IgG lambda subtype in December 2014, treated with combination of lenalidomide , bortezomib  and dexamethasone  along with Zometa  between January 2015 to September 2015 with near complete response.  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

## 2024-09-13 NOTE — Research (Signed)
 Research - [facility] ECOG-ACRIN [id number] Maintenance Cycle 124 Day 1  Patient into clinic unaccompanied today for evaluation prior to beginning treatment Cycle 124 of revlimid .   Patient Letter Results summary letter from [facility] v.07/14/24 was provided to patient.  Research nurse reviewed contents of letter and patient verbalized understanding. Dr. Lonn aware of results and also discussed current study status with patient.   Labs w/myeloma panel Blood drawn on 09/06/2024 within 7-day protocol window to allow adequate time for results prior to treatment. Dr. Lonn reviewed all lab results and found them to be within parameters for continued dosing at Maintenance phase Dose Level -3, Revlimid  5 mg every other day for 21 days, every 28 days.  Per Dr. Lonn, bone marrow biopsy and aspirate would not be considered standard of care at this time. In addition, Dr. Lonn states that 24 hr urine is not required to assess disease response.  Myeloma panel continues to have comment, Immunofixation shows IgG monoclonal protein with lambda light chain specificity. Dr. Lonn considers patient meeting criteria for continued Very Good Partial Response per protocol defined response terms.    History and physical exam See MD note dated today.  Adverse Events Patient reported the following since last visit; Ongoing fatigue (grade 2) unchanged, not limiting self-care. 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 Insomnia (grade 2), she took trazodone  prn to help sleep. She denies diarrhea, nausea, vomiting, shortness of breath, swelling, fever, irritability, muscle weakness, motor peripheral neuropathy, or rash since last visit.  Other reported AEs include ongoing daily moderate back ache (grade 2) which she occasionally takes tramadol  and methocarbamol .  She has been swimming again about 3 times per week this past month.     See AE table below.     Maintenance Cycle 121-123  06/14/24-09/13/24 (end of Cycle 123 =09/12/24) Solicited &/or Reportable Events Grade Attribution to lenalidomide  Cycle # Comments  Anemia Grade 1 Definite 121, 122    Hyperglycemia Grade 0 -      Lymphocyte count decreased Grade 0 -      Neutrophil count decreased Grade 2 Definite 121, 122, 123   Neutrophil count decreased Grade 3 Definite 121    Platelet count decreased Grade 0 -      Diarrhea Grade 1 Possible     121 Baseline is 1 stool/ 24 hrs  Dyspnea Grade 0 -      Edema: limbs Grade 0 -      Fatigue  Grade 2  Probable 121, 122, 123 Moderate, occasionally limiting ADLs (unchanged from previous)  Fever Grade 0 -      Insomnia  Grade 2 Unlikely  121, 122, 123  trazodone  prn  Irritability Grade 0 -      Muscle weakness trunk Grade 0 -      Nausea 0 -      Peripheral motor neuropathy Grade 0 -      Peripheral sensory neuropathy  Grade 2 Unlikely  121, 122, 123 Moderate symptoms, unchanged from previous  Rash acneiform Grade 0  -      Vomiting Grade 0 -        Non-reportable AEs (unsolicited, < Grade 3): Moderate back pain (grade 2) Decreased WBC (grade 1) Constipation (grade 1) Intermittent left lower abd pain (grade 1)   Maintenance therapy with Revlimid   Patient returned completed cycle 123 Medication Calendar last week, confirming dosing at dose level -3, Revlimid  5mg  every other day, Days 1-21. Patient notes no missed doses of  Revlimid  this cycle.  Patient given printed appointment calendar with Cycle 124 treatment days marked, to document doses for the next treatment cycle to begin today.   Plan. Patient to return in 4 weeks on 10/11/24 for AE assessment and CBC prior to starting next cycle of Revlimid . Patient verbalized understanding to start revlimid  today and return in 4 weeks.   Encouraged patient to call research nurse if any questions prior to next visit. She verbalized understanding.   Cherylyn Hoard, BSN, RN, [employer] Research Nurse II [phone] 09/13/2024

## 2024-09-14 DIAGNOSIS — H401112 Primary open-angle glaucoma, right eye, moderate stage: Secondary | ICD-10-CM | POA: Diagnosis not present

## 2024-09-14 DIAGNOSIS — H40022 Open angle with borderline findings, high risk, left eye: Secondary | ICD-10-CM | POA: Diagnosis not present

## 2024-09-26 ENCOUNTER — Other Ambulatory Visit: Payer: Self-pay

## 2024-09-26 DIAGNOSIS — C9001 Multiple myeloma in remission: Secondary | ICD-10-CM

## 2024-09-26 MED ORDER — LENALIDOMIDE 5 MG PO CAPS: 5.0000 mg | ORAL_CAPSULE | ORAL | 0 refills | Status: DC

## 2024-10-11 ENCOUNTER — Encounter: Payer: Self-pay | Admitting: *Deleted

## 2024-10-11 ENCOUNTER — Inpatient Hospital Stay: Attending: Hematology and Oncology

## 2024-10-11 DIAGNOSIS — C9001 Multiple myeloma in remission: Secondary | ICD-10-CM

## 2024-10-11 DIAGNOSIS — Z006 Encounter for examination for normal comparison and control in clinical research program: Secondary | ICD-10-CM | POA: Insufficient documentation

## 2024-10-11 LAB — CBC WITH DIFFERENTIAL (CANCER CENTER ONLY)
Abs Immature Granulocytes: 0 K/uL (ref 0.00–0.07)
Basophils Absolute: 0 K/uL (ref 0.0–0.1)
Basophils Relative: 1 %
Eosinophils Absolute: 0.1 K/uL (ref 0.0–0.5)
Eosinophils Relative: 1 %
HCT: 37.1 % (ref 36.0–46.0)
Hemoglobin: 11.6 g/dL — ABNORMAL LOW (ref 12.0–15.0)
Immature Granulocytes: 0 %
Lymphocytes Relative: 53 %
Lymphs Abs: 1.9 K/uL (ref 0.7–4.0)
MCH: 22.9 pg — ABNORMAL LOW (ref 26.0–34.0)
MCHC: 31.3 g/dL (ref 30.0–36.0)
MCV: 73.2 fL — ABNORMAL LOW (ref 80.0–100.0)
Monocytes Absolute: 0.5 K/uL (ref 0.1–1.0)
Monocytes Relative: 14 %
Neutro Abs: 1.1 K/uL — ABNORMAL LOW (ref 1.7–7.7)
Neutrophils Relative %: 31 %
Platelet Count: 196 K/uL (ref 150–400)
RBC: 5.07 MIL/uL (ref 3.87–5.11)
RDW: 15.9 % — ABNORMAL HIGH (ref 11.5–15.5)
WBC Count: 3.6 K/uL — ABNORMAL LOW (ref 4.0–10.5)
nRBC: 0 % (ref 0.0–0.2)

## 2024-10-11 NOTE — Research (Signed)
 Research - [facility] ECOG-ACRIN [id number] Maintenance Cycle 125 Day 1  Patient into clinic unaccompanied today for evaluation prior to beginning treatment Cycle 125 of revlimid .   Labs CBC drawn per protocol.   Adverse Events Patient reported the following since last visit; Ongoing fatigue (grade 2) unchanged, not limiting self-care. 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 Insomnia (grade 2), she took trazodone  prn to help sleep. She denies diarrhea, nausea, vomiting, shortness of breath, swelling, fever, irritability, muscle weakness, motor peripheral neuropathy, or rash since last visit.  Other reported AEs include ongoing daily moderate back ache (grade 2) which she occasionally takes tramadol  and methocarbamol .  Patient also reports one episode of vertigo which prevented her from leaving the house and she had to lay down. It persisted for most of the day but eventually resolved same day. Patient states she has a history of this and it is not unusual.  She has been swimming about 3 times per week this past month.     See AE table below.     Maintenance Cycle 124 09/13/24-10/11/24 (end of Cycle 124 =10/10/24) Solicited &/or Reportable Events Grade Attribution to lenalidomide  Cycle # Comments  Anemia Grade 1 Definite 124    Hyperglycemia Grade 0 -      Lymphocyte count decreased Grade 0 -      Neutrophil count decreased Grade 2 Definite 124   Platelet count decreased Grade 0 -      Diarrhea Grade 0 -      Baseline is 1 stool/ 24 hrs  Dyspnea Grade 0 -      Edema: limbs Grade 0 -      Fatigue  Grade 2  Probable 124 Moderate, occasionally limiting ADLs (unchanged from previous)  Fever Grade 0 -      Insomnia  Grade 2 Unlikely  124  trazodone  prn  Irritability Grade 0 -      Muscle weakness trunk Grade 0 -      Nausea 0 -      Peripheral motor neuropathy Grade 0 -      Peripheral sensory neuropathy  Grade 2 Unlikely  124 Moderate symptoms, unchanged from previous   Rash acneiform Grade 0  -      Vomiting Grade 0 -        Non-reportable AEs (unsolicited, < Grade 3): Moderate back pain (grade 2) Decreased WBC (grade 1) Vertigo (grade 2)  Maintenance therapy with Revlimid   Patient returned completed cycle 124 Medication Calendar last week, confirming dosing at dose level -3, Revlimid  5mg  every other day, Days 1-21. Patient notes no missed doses of Revlimid  this cycle.  Patient given printed appointment calendar with Cycle 125 treatment days marked, to document doses for the next treatment cycle to begin today pending results of CBC.   CBC results-  Called patient at home after results available. ANC is 1.1 which meets parameters to start next cycle of Revlimid  today. Patient verbalized understanding to start revlimid  today as planned.   Plan. Patient to return in 4 weeks on 11/08/24 for next lab and AE assessment prior to starting next cycle of Revlimid . .  Encouraged patient to call research nurse if any questions prior to next visit. She verbalized understanding.   Cherylyn Hoard, BSN, RN, [employer] Research Nurse II [phone] 10/11/2024

## 2024-10-27 ENCOUNTER — Other Ambulatory Visit: Payer: Self-pay | Admitting: Hematology and Oncology

## 2024-10-27 DIAGNOSIS — C9001 Multiple myeloma in remission: Secondary | ICD-10-CM

## 2024-10-28 NOTE — Telephone Encounter (Signed)
Please refill electronically

## 2024-11-08 ENCOUNTER — Inpatient Hospital Stay: Attending: Hematology and Oncology

## 2024-11-08 ENCOUNTER — Other Ambulatory Visit: Payer: Self-pay | Admitting: Hematology and Oncology

## 2024-11-08 ENCOUNTER — Encounter: Payer: Self-pay | Admitting: *Deleted

## 2024-11-08 DIAGNOSIS — Z006 Encounter for examination for normal comparison and control in clinical research program: Secondary | ICD-10-CM | POA: Diagnosis not present

## 2024-11-08 DIAGNOSIS — R946 Abnormal results of thyroid function studies: Secondary | ICD-10-CM | POA: Insufficient documentation

## 2024-11-08 DIAGNOSIS — C9001 Multiple myeloma in remission: Secondary | ICD-10-CM | POA: Diagnosis present

## 2024-11-08 DIAGNOSIS — Z79899 Other long term (current) drug therapy: Secondary | ICD-10-CM | POA: Insufficient documentation

## 2024-11-08 LAB — CBC WITH DIFFERENTIAL (CANCER CENTER ONLY)
Abs Immature Granulocytes: 0 K/uL (ref 0.00–0.07)
Basophils Absolute: 0 K/uL (ref 0.0–0.1)
Basophils Relative: 1 %
Eosinophils Absolute: 0.1 K/uL (ref 0.0–0.5)
Eosinophils Relative: 2 %
HCT: 35.6 % — ABNORMAL LOW (ref 36.0–46.0)
Hemoglobin: 11.1 g/dL — ABNORMAL LOW (ref 12.0–15.0)
Immature Granulocytes: 0 %
Lymphocytes Relative: 55 %
Lymphs Abs: 2.1 K/uL (ref 0.7–4.0)
MCH: 22.8 pg — ABNORMAL LOW (ref 26.0–34.0)
MCHC: 31.2 g/dL (ref 30.0–36.0)
MCV: 73.1 fL — ABNORMAL LOW (ref 80.0–100.0)
Monocytes Absolute: 0.6 K/uL (ref 0.1–1.0)
Monocytes Relative: 15 %
Neutro Abs: 1 K/uL — ABNORMAL LOW (ref 1.7–7.7)
Neutrophils Relative %: 27 %
Platelet Count: 196 K/uL (ref 150–400)
RBC: 4.87 MIL/uL (ref 3.87–5.11)
RDW: 15.3 % (ref 11.5–15.5)
WBC Count: 3.8 K/uL — ABNORMAL LOW (ref 4.0–10.5)
nRBC: 0 % (ref 0.0–0.2)

## 2024-11-08 NOTE — Research (Signed)
 Research - [facility] [id number] Maintenance Cycle 126 Day 1  Patient into clinic unaccompanied today for evaluation prior to beginning treatment Cycle 126 of revlimid .   Labs CBC drawn per protocol.   Adverse Events Patient reported the following since last visit; Ongoing fatigue (grade 2) unchanged, not limiting self-care. 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 Insomnia (grade 2), she took trazodone  prn to help sleep. She denies diarrhea, nausea, vomiting, shortness of breath, swelling, fever, irritability, muscle weakness, motor peripheral neuropathy, or rash since last visit.  Other reported AEs include ongoing daily moderate back ache (grade 2) which she occasionally takes tramadol  and methocarbamol .  Patient also reports one episode of migraine (grade 2) relieved with tramadol .  She has been swimming about 3 times per week this past month.     See AE table below.     Maintenance Cycle 125 10/11/24 -11/08/24 (end of Cycle 125 =11/08/24) Solicited &/or Reportable Events Grade Attribution to lenalidomide  Cycle # Comments  Anemia Grade 1 Definite 124, 125    Hyperglycemia Grade 0 -      Lymphocyte count decreased Grade 0 -      Neutrophil count decreased Grade 2 Definite 124, 125   Platelet count decreased Grade 0 -      Diarrhea Grade 0 -      Baseline is 1 stool/ 24 hrs  Dyspnea Grade 0 -      Edema: limbs Grade 0 -      Fatigue  Grade 2  Probable 124, 125 Moderate, occasionally limiting ADLs (unchanged from previous)  Fever Grade 0 -      Insomnia  Grade 2 Unlikely  124, 125  trazodone  prn  Irritability Grade 0 -      Muscle weakness trunk Grade 0 -      Nausea 0 -      Peripheral motor neuropathy Grade 0 -      Peripheral sensory neuropathy  Grade 2 Unlikely  124, 125 Moderate symptoms, unchanged from previous  Rash acneiform Grade 0  -      Vomiting Grade 0 -        Non-reportable AEs (unsolicited, < Grade 3): Moderate back pain (grade  2) Decreased WBC (grade 1) Migraine (grade 2)  Maintenance therapy with Revlimid   Patient returned completed cycle 125 Medication Calendar last week, confirming dosing at dose level -3, Revlimid  5mg  every other day, Days 1-21. Patient notes no missed doses of Revlimid  this cycle.  Patient given printed appointment calendar with Cycle 126 treatment days marked, to document doses for the next treatment cycle to begin today pending results of CBC.   CBC results-  Called patient at home after results available. ANC is 1.0 which meets parameters to start next cycle of Revlimid  today. Left VM for patient to start next cycle of revlimid  today as planned. Left my phone number to call back if any questions.   Plan. Patient to return in 3 weeks on 11/29/24 for labs to be done one week prior to MD appt on 12/06/24. Encouraged patient to call research nurse if any questions prior to next visit. She verbalized understanding.   Cherylyn Hoard, BSN, RN, [employer] Research Nurse II [phone] 11/08/2024

## 2024-11-25 ENCOUNTER — Other Ambulatory Visit: Payer: Self-pay | Admitting: Hematology and Oncology

## 2024-11-25 DIAGNOSIS — C9001 Multiple myeloma in remission: Secondary | ICD-10-CM

## 2024-11-28 ENCOUNTER — Other Ambulatory Visit: Payer: Self-pay

## 2024-11-28 ENCOUNTER — Other Ambulatory Visit: Payer: Self-pay | Admitting: Hematology and Oncology

## 2024-11-28 DIAGNOSIS — C9001 Multiple myeloma in remission: Secondary | ICD-10-CM

## 2024-11-28 MED ORDER — LENALIDOMIDE 5 MG PO CAPS: ORAL_CAPSULE | ORAL | 0 refills | Status: DC

## 2024-11-28 NOTE — Telephone Encounter (Signed)
 Reason for Call:   Fax sent to request a refill of REVLIMID  5 mg capsules.  Action Taken:   Refill request fax successfully transmitted.

## 2024-11-29 ENCOUNTER — Inpatient Hospital Stay

## 2024-11-29 DIAGNOSIS — C9001 Multiple myeloma in remission: Secondary | ICD-10-CM

## 2024-11-29 DIAGNOSIS — Z006 Encounter for examination for normal comparison and control in clinical research program: Secondary | ICD-10-CM | POA: Diagnosis not present

## 2024-11-29 LAB — CBC WITH DIFFERENTIAL (CANCER CENTER ONLY)
Abs Immature Granulocytes: 0.01 K/uL (ref 0.00–0.07)
Basophils Absolute: 0 K/uL (ref 0.0–0.1)
Basophils Relative: 1 %
Eosinophils Absolute: 0.1 K/uL (ref 0.0–0.5)
Eosinophils Relative: 2 %
HCT: 37.7 % (ref 36.0–46.0)
Hemoglobin: 12.1 g/dL (ref 12.0–15.0)
Immature Granulocytes: 0 %
Lymphocytes Relative: 48 %
Lymphs Abs: 1.7 K/uL (ref 0.7–4.0)
MCH: 23.2 pg — ABNORMAL LOW (ref 26.0–34.0)
MCHC: 32.1 g/dL (ref 30.0–36.0)
MCV: 72.2 fL — ABNORMAL LOW (ref 80.0–100.0)
Monocytes Absolute: 0.5 K/uL (ref 0.1–1.0)
Monocytes Relative: 14 %
Neutro Abs: 1.2 K/uL — ABNORMAL LOW (ref 1.7–7.7)
Neutrophils Relative %: 35 %
Platelet Count: 225 K/uL (ref 150–400)
RBC: 5.22 MIL/uL — ABNORMAL HIGH (ref 3.87–5.11)
RDW: 15.3 % (ref 11.5–15.5)
WBC Count: 3.5 K/uL — ABNORMAL LOW (ref 4.0–10.5)
nRBC: 0 % (ref 0.0–0.2)

## 2024-11-29 LAB — CMP (CANCER CENTER ONLY)
ALT: 28 U/L (ref 0–44)
AST: 29 U/L (ref 15–41)
Albumin: 4.3 g/dL (ref 3.5–5.0)
Alkaline Phosphatase: 60 U/L (ref 38–126)
Anion gap: 9 (ref 5–15)
BUN: 16 mg/dL (ref 8–23)
CO2: 27 mmol/L (ref 22–32)
Calcium: 9.3 mg/dL (ref 8.9–10.3)
Chloride: 103 mmol/L (ref 98–111)
Creatinine: 1 mg/dL (ref 0.44–1.00)
GFR, Estimated: >60 mL/min
Glucose, Bld: 103 mg/dL — ABNORMAL HIGH (ref 70–99)
Potassium: 3.7 mmol/L (ref 3.5–5.1)
Sodium: 139 mmol/L (ref 135–145)
Total Bilirubin: 0.8 mg/dL (ref 0.0–1.2)
Total Protein: 7.8 g/dL (ref 6.5–8.1)

## 2024-11-29 LAB — TSH: TSH: 1.39 u[IU]/mL (ref 0.350–4.500)

## 2024-11-29 LAB — LACTATE DEHYDROGENASE: LDH: 182 U/L (ref 105–235)

## 2024-11-30 LAB — KAPPA/LAMBDA LIGHT CHAINS
Kappa free light chain: 31.3 mg/L — ABNORMAL HIGH (ref 3.3–19.4)
Kappa, lambda light chain ratio: 1.36 (ref 0.26–1.65)
Lambda free light chains: 23 mg/L (ref 5.7–26.3)

## 2024-12-05 LAB — MULTIPLE MYELOMA PANEL, SERUM
Albumin SerPl Elph-Mcnc: 3.8 g/dL (ref 2.9–4.4)
Albumin/Glob SerPl: 1.2 (ref 0.7–1.7)
Alpha 1: 0.1 g/dL (ref 0.0–0.4)
Alpha2 Glob SerPl Elph-Mcnc: 0.5 g/dL (ref 0.4–1.0)
B-Globulin SerPl Elph-Mcnc: 1 g/dL (ref 0.7–1.3)
Gamma Glob SerPl Elph-Mcnc: 1.7 g/dL (ref 0.4–1.8)
Globulin, Total: 3.4 g/dL (ref 2.2–3.9)
IgA: 113 mg/dL (ref 87–352)
IgG (Immunoglobin G), Serum: 1869 mg/dL — ABNORMAL HIGH (ref 586–1602)
IgM (Immunoglobulin M), Srm: 56 mg/dL (ref 26–217)
M Protein SerPl Elph-Mcnc: 0.5 g/dL — ABNORMAL HIGH
Total Protein ELP: 7.2 g/dL (ref 6.0–8.5)

## 2024-12-06 ENCOUNTER — Inpatient Hospital Stay: Attending: Hematology and Oncology | Admitting: Hematology and Oncology

## 2024-12-06 ENCOUNTER — Telehealth: Payer: Self-pay

## 2024-12-06 ENCOUNTER — Encounter: Payer: Self-pay | Admitting: *Deleted

## 2024-12-06 VITALS — BP 143/87 | HR 77 | Temp 97.9°F | Resp 18 | Ht 67.0 in | Wt 171.8 lb

## 2024-12-06 DIAGNOSIS — D61811 Other drug-induced pancytopenia: Secondary | ICD-10-CM | POA: Diagnosis not present

## 2024-12-06 DIAGNOSIS — C9001 Multiple myeloma in remission: Secondary | ICD-10-CM

## 2024-12-06 NOTE — Telephone Encounter (Signed)
 Called and left a message skeletal bone survey scheduled at [facility] at 9 am on 2/6. Ask her to call the office for questions.

## 2024-12-06 NOTE — Research (Signed)
 Research - [facility] [id number] Maintenance Cycle 127 Day 1  Patient into clinic unaccompanied today for evaluation prior to beginning treatment Cycle 127 of revlimid .   Patient Letter Patient letter from [employer] dated 11/02/2024 was provided to patient and reviewed by Dr. Lonn with patient.  Dr. Lonn was provided the physician letter dated 11/02/24 prior to this visit. Dr. Lonn explained study findings that continuing maintenance treatment with revlimid  indefinitely is unlikely to be any better than being treated with 2 years of maintenance revlimid .  After discussion with Dr. Lonn patient signed the patient letter to stop treatment on study.  Copy of signed letter given to patient for her records.   Labs w/myeloma panel Blood drawn on 09/29/2024 within 7-day protocol window to allow adequate time for results prior to treatment. Dr. Lonn reviewed all lab results.  M protein on SPEP is 0.5 g/dL.    History and physical exam See MD note dated today.  Adverse Events Patient reported the following since last visit; Ongoing fatigue (grade 2) unchanged, not limiting self-care. 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 Insomnia (grade 2), she took trazodone  prn to help sleep. She denies diarrhea, nausea, vomiting, shortness of breath, swelling, fever, irritability, muscle weakness, motor peripheral neuropathy, or rash since last visit.  Other reported AEs include ongoing daily moderate back ache (grade 2) which she occasionally takes tramadol  and methocarbamol .   She has been swimming about 3 times per week this past month.     See AE table below.     Maintenance Cycles 124-126 09/13/24 -12/06/24 (end of Cycle 126 =12/05/24) Solicited &/or Reportable Events Grade Attribution to lenalidomide  Cycle # Comments  Anemia Grade 1 Definite 124, 125    Anemia Grade 0  126   Hyperglycemia Grade 0 -      Lymphocyte count decreased Grade 0 -      Neutrophil count  decreased Grade 2 Definite 124, 125, 126   Platelet count decreased Grade 0 -      Diarrhea Grade 0 -      Baseline is 1 stool/ 24 hrs  Dyspnea Grade 0 -      Edema: limbs Grade 0 -      Fatigue  Grade 2  Probable 124, 125, 126 Moderate, occasionally limiting ADLs (unchanged from previous)  Fever Grade 0 -      Insomnia  Grade 2 Unlikely  124, 125, 126  trazodone  prn  Irritability Grade 0 -      Muscle weakness trunk Grade 0 -      Nausea 0 -      Peripheral motor neuropathy Grade 0 -      Peripheral sensory neuropathy  Grade 2 Unlikely  124, 125, 126 Moderate symptoms, unchanged from previous  Rash acneiform Grade 0  -      Vomiting Grade 0 -        Non-reportable AEs (unsolicited, < Grade 3): Moderate back pain (grade 2) Decreased WBC (grade 1) Migraine (grade 2)  Maintenance therapy with Revlimid   Patient returned completed cycle 126 Medication Calendar, confirming dosing at dose level -3, Revlimid  5mg  every other day, Days 1-21. Patient notes no missed doses of Revlimid  this cycle.    Withdrawal from Study Treatment Patient signed the [facility] IRB Withdrawal of Treatment Consent form today. Patient does agree for the research team to continue to follow patient and collect data from her medical record to report to the study. Copy of signed form  given to patient for her records.   Plan  Although patient will stop revlimid  treatment on study effective today, Dr. Lonn instructed patient to take next cycle of Revlimid  as planned and then stop. Therefore, Revlimid  for cycle 127 is considered off study.  Dr. Lonn wants patient to return in one month for further testing to assess disease status. Bone Marrow Biopsy is not ordered at this time but may be ordered depending on results of testing next month. Patient understands that research nurse will continue to follow patient's case for data collection and patient can call research nurse if any questions. Thanked patient for her  participation in this study and ongoing support.  Patient was escorted to the lab to collect jug and supplies for 24 hr urine collection.    Cherylyn Hoard, BSN, RN, [employer] Research Nurse II [phone] 12/06/2024

## 2024-12-06 NOTE — Assessment & Plan Note (Addendum)
This is due to her treatment She is not symptomatic Observe 

## 2024-12-06 NOTE — Assessment & Plan Note (Addendum)
 The patient was diagnosed with stage I multiple myeloma, IgG lambda subtype in December 2014, treated with combination of lenalidomide , bortezomib  and dexamethasone  along with Zometa  between January 2015 to September 2015 with near complete response.  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  Over the last few months, while on maintenance lenalidomide , she was noted to have detectable faint band on immunofixation Her most recent myeloma panel from December showed detectable M spike Could be the first hint of recurrent disease I plan to recheck myeloma panel again in a month with 24-hour urine collection and skeletal survey Based on recent analysis from her clinical trial, progression free survival and overall survival is a patient treated with maintenance Lenalidomide  for an indefinite duration is not significantly different than dose to receive fixed duration Due to lack of superiority of continuing maintenance lenalidomide , I recommend discontinuation of treatment after her current prescription runs out

## 2024-12-06 NOTE — Progress Notes (Signed)
 [facility] OFFICE PROGRESS NOTE  Patient Care Team: Ransom Other, MD as PCP - General (Internal Medicine) Lonn Hicks, MD as Consulting Physician (Hematology and Oncology) Onita Duos, MD as Consulting Physician (Neurology)  Assessment & Plan Multiple myeloma in remission [facility]) The patient was diagnosed with stage I multiple myeloma, IgG lambda subtype in December 2014, treated with combination of lenalidomide , bortezomib  and dexamethasone  along with Zometa  between January 2015 to September 2015 with near complete response.  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  Over the last few months, while on maintenance lenalidomide , she was noted to have detectable faint band on immunofixation Her most recent myeloma panel from December showed detectable M spike Could be the first hint of recurrent disease I plan to recheck myeloma panel again in a month with 24-hour urine collection and skeletal survey Based on recent analysis from her clinical trial, progression free survival and overall survival is a patient treated with maintenance Lenalidomide  for an indefinite duration is not significantly different than dose to receive fixed duration Due to lack of superiority of continuing maintenance lenalidomide , I recommend discontinuation of treatment after her current prescription runs out Drug-induced pancytopenia This is due to her treatment She is not symptomatic Observe  Orders Placed This Encounter  Procedures   DG Bone Survey Met    Standing Status:   Future    Expected Date:   01/06/2025    Expiration Date:   12/06/2025    Reason for Exam (SYMPTOM  OR DIAGNOSIS REQUIRED):   staging myeloma    Preferred imaging location?:   [facility]   CBC with Differential (Cancer Center Only)     Standing Status:   Future    Expiration Date:   12/06/2025   CMP (Cancer Center only)    Standing Status:   Future    Expiration Date:   12/06/2025   Lactate dehydrogenase    Standing Status:   Future    Expiration Date:   12/06/2025   Beta 2 microglobulin, serum    Standing Status:   Future    Expiration Date:   12/06/2025   UPEP/UIFE/Light Chains/TP, 24-Hr Ur    Standing Status:   Future    Expiration Date:   12/06/2025   Multiple Myeloma Panel (SPEP&IFE w/QIG)    Standing Status:   Future    Expiration Date:   12/06/2025   Kappa/lambda light chains    Standing Status:   Future    Expiration Date:   12/06/2025   VITAMIN D  25 Hydroxy (Vit-D Deficiency, Fractures)    Standing Status:   Future    Expiration Date:   12/06/2025     Hicks Lonn, MD  INTERVAL HISTORY: she returns for treatment follow-up for multiple myeloma Complications related to previous cycle of chemotherapy included pancytopenia, I expressed concern that she might have disease relapse We discussed plan of care to repeat full labs, 24-hour urine collection and imaging study with possibility of bone marrow biopsy in the near future if confirmed disease relapse We also discussed preliminary results from her clinical trial and the rationale behind discontinuation of her maintenance treatment  PHYSICAL EXAMINATION: ECOG PERFORMANCE STATUS: 0 - Asymptomatic  Lab Results  Component Value Date   IGGSERUM 1,869 (H) 11/29/2024   TOTALPROTELP 7.2 11/29/2024   MPROTEIN 0.5 (H) 11/29/2024  IFE Comment (A) 11/29/2024   KPAFRELGTCHN 31.3 (H) 11/29/2024   LAMBDASER 23.0 11/29/2024   KAPLAMBRATIO 1.36 11/29/2024   KAPLAMBRATIO 1.24 09/06/2024   KAPLAMBRATIO 1.39 06/07/2024      Latest Ref Rng & Units 11/29/2024    7:13 AM 11/08/2024    7:27 AM 10/11/2024    8:36 AM  CBC  WBC 4.0 - 10.5 K/uL 3.5  3.8  3.6   Hemoglobin 12.0 - 15.0 g/dL 87.8  88.8  88.3   Hematocrit 36.0 - 46.0 % 37.7  35.6  37.1   Platelets 150 - 400 K/uL 225   196  196       Chemistry      Component Value Date/Time   NA 139 11/29/2024 0713   NA 141 09/29/2017 0830   K 3.7 11/29/2024 0713   K 3.6 09/29/2017 0830   CL 103 11/29/2024 0713   CO2 27 11/29/2024 0713   CO2 29 09/29/2017 0830   BUN 16 11/29/2024 0713   BUN 14.0 09/29/2017 0830   CREATININE 1.00 11/29/2024 0713   CREATININE 0.9 09/29/2017 0830      Component Value Date/Time   CALCIUM 9.3 11/29/2024 0713   CALCIUM 9.6 09/29/2017 0830   ALKPHOS 60 11/29/2024 0713   ALKPHOS 60 09/29/2017 0830   AST 29 11/29/2024 0713   AST 21 09/29/2017 0830   ALT 28 11/29/2024 0713   ALT 69 (H) 09/29/2017 0830   BILITOT 0.8 11/29/2024 0713   BILITOT 0.46 09/29/2017 0830       Vitals:   12/06/24 0854  BP: (!) 143/87  Pulse: 77  Resp: 18  Temp: 97.9 F (36.6 C)  SpO2: 100%   Filed Weights   12/06/24 0854  Weight: 171 lb 12.8 oz (77.9 kg)   Other relevant data reviewed during this visit included CBC, CMP, recent myeloma panel

## 2024-12-19 ENCOUNTER — Ambulatory Visit: Payer: Medicare Other | Admitting: Family Medicine

## 2024-12-24 ENCOUNTER — Other Ambulatory Visit: Payer: Self-pay | Admitting: Hematology and Oncology

## 2025-01-06 ENCOUNTER — Inpatient Hospital Stay: Attending: Hematology and Oncology

## 2025-01-06 ENCOUNTER — Ambulatory Visit (HOSPITAL_COMMUNITY): Admission: RE | Admit: 2025-01-06 | Source: Ambulatory Visit

## 2025-01-06 ENCOUNTER — Other Ambulatory Visit: Payer: Self-pay

## 2025-01-06 DIAGNOSIS — C9001 Multiple myeloma in remission: Secondary | ICD-10-CM

## 2025-01-06 LAB — LACTATE DEHYDROGENASE: LDH: 190 U/L (ref 105–235)

## 2025-01-06 LAB — CMP (CANCER CENTER ONLY)
ALT: 23 U/L (ref 0–44)
AST: 25 U/L (ref 15–41)
Albumin: 4.1 g/dL (ref 3.5–5.0)
Alkaline Phosphatase: 59 U/L (ref 38–126)
Anion gap: 10 (ref 5–15)
BUN: 15 mg/dL (ref 8–23)
CO2: 25 mmol/L (ref 22–32)
Calcium: 9.2 mg/dL (ref 8.9–10.3)
Chloride: 105 mmol/L (ref 98–111)
Creatinine: 0.92 mg/dL (ref 0.44–1.00)
GFR, Estimated: >60 mL/min
Glucose, Bld: 97 mg/dL (ref 70–99)
Potassium: 3.9 mmol/L (ref 3.5–5.1)
Sodium: 139 mmol/L (ref 135–145)
Total Bilirubin: 0.7 mg/dL (ref 0.0–1.2)
Total Protein: 7.5 g/dL (ref 6.5–8.1)

## 2025-01-06 LAB — CBC WITH DIFFERENTIAL (CANCER CENTER ONLY)
Abs Immature Granulocytes: 0.01 10*3/uL (ref 0.00–0.07)
Basophils Absolute: 0 10*3/uL (ref 0.0–0.1)
Basophils Relative: 1 %
Eosinophils Absolute: 0.1 10*3/uL (ref 0.0–0.5)
Eosinophils Relative: 2 %
HCT: 36.9 % (ref 36.0–46.0)
Hemoglobin: 11.8 g/dL — ABNORMAL LOW (ref 12.0–15.0)
Immature Granulocytes: 0 %
Lymphocytes Relative: 45 %
Lymphs Abs: 1.6 10*3/uL (ref 0.7–4.0)
MCH: 23.2 pg — ABNORMAL LOW (ref 26.0–34.0)
MCHC: 32 g/dL (ref 30.0–36.0)
MCV: 72.6 fL — ABNORMAL LOW (ref 80.0–100.0)
Monocytes Absolute: 0.5 10*3/uL (ref 0.1–1.0)
Monocytes Relative: 15 %
Neutro Abs: 1.3 10*3/uL — ABNORMAL LOW (ref 1.7–7.7)
Neutrophils Relative %: 37 %
Platelet Count: 221 10*3/uL (ref 150–400)
RBC: 5.08 MIL/uL (ref 3.87–5.11)
RDW: 15.4 % (ref 11.5–15.5)
WBC Count: 3.5 10*3/uL — ABNORMAL LOW (ref 4.0–10.5)
nRBC: 0 % (ref 0.0–0.2)

## 2025-01-06 LAB — VITAMIN D 25 HYDROXY (VIT D DEFICIENCY, FRACTURES): Vit D, 25-Hydroxy: 55.2 ng/mL (ref 30–100)

## 2025-01-12 ENCOUNTER — Ambulatory Visit: Admitting: Neurology

## 2025-01-17 ENCOUNTER — Inpatient Hospital Stay: Admitting: Hematology and Oncology
